# Patient Record
Sex: Female | Born: 1940
Health system: Southern US, Community
[De-identification: ages and names within clinical notes are randomized; demographics above are authoritative.]

## PROBLEM LIST (undated history)

## (undated) DIAGNOSIS — M810 Age-related osteoporosis without current pathological fracture: Secondary | ICD-10-CM

## (undated) DIAGNOSIS — T7840XA Allergy, unspecified, initial encounter: Secondary | ICD-10-CM

## (undated) DIAGNOSIS — Z5189 Encounter for other specified aftercare: Secondary | ICD-10-CM

## (undated) DIAGNOSIS — H269 Unspecified cataract: Secondary | ICD-10-CM

## (undated) DIAGNOSIS — J329 Chronic sinusitis, unspecified: Secondary | ICD-10-CM

## (undated) DIAGNOSIS — I2510 Atherosclerotic heart disease of native coronary artery without angina pectoris: Secondary | ICD-10-CM

## (undated) DIAGNOSIS — C50919 Malignant neoplasm of unspecified site of unspecified female breast: Secondary | ICD-10-CM

## (undated) DIAGNOSIS — E785 Hyperlipidemia, unspecified: Secondary | ICD-10-CM

## (undated) DIAGNOSIS — R011 Cardiac murmur, unspecified: Secondary | ICD-10-CM

## (undated) DIAGNOSIS — K219 Gastro-esophageal reflux disease without esophagitis: Secondary | ICD-10-CM

## (undated) DIAGNOSIS — I82409 Acute embolism and thrombosis of unspecified deep veins of unspecified lower extremity: Secondary | ICD-10-CM

## (undated) DIAGNOSIS — I6529 Occlusion and stenosis of unspecified carotid artery: Secondary | ICD-10-CM

## (undated) DIAGNOSIS — K2960 Other gastritis without bleeding: Secondary | ICD-10-CM

## (undated) DIAGNOSIS — I10 Essential (primary) hypertension: Secondary | ICD-10-CM

## (undated) DIAGNOSIS — F419 Anxiety disorder, unspecified: Secondary | ICD-10-CM

## (undated) DIAGNOSIS — I350 Nonrheumatic aortic (valve) stenosis: Secondary | ICD-10-CM

## (undated) DIAGNOSIS — F329 Major depressive disorder, single episode, unspecified: Secondary | ICD-10-CM

## (undated) DIAGNOSIS — J309 Allergic rhinitis, unspecified: Secondary | ICD-10-CM

## (undated) DIAGNOSIS — Z86718 Personal history of other venous thrombosis and embolism: Secondary | ICD-10-CM

## (undated) DIAGNOSIS — F32A Depression, unspecified: Secondary | ICD-10-CM

## (undated) DIAGNOSIS — E039 Hypothyroidism, unspecified: Secondary | ICD-10-CM

## (undated) DIAGNOSIS — M8580 Other specified disorders of bone density and structure, unspecified site: Secondary | ICD-10-CM

## (undated) DIAGNOSIS — K5730 Diverticulosis of large intestine without perforation or abscess without bleeding: Secondary | ICD-10-CM

## (undated) HISTORY — DX: Encounter for other specified aftercare: Z51.89

## (undated) HISTORY — DX: Hypothyroidism, unspecified: E03.9

## (undated) HISTORY — DX: Hyperlipidemia, unspecified: E78.5

## (undated) HISTORY — DX: Depression, unspecified: F32.A

## (undated) HISTORY — DX: Age-related osteoporosis without current pathological fracture: M81.0

## (undated) HISTORY — PX: TONSILLECTOMY AND ADENOIDECTOMY: SUR1326

## (undated) HISTORY — DX: Acute embolism and thrombosis of unspecified deep veins of unspecified lower extremity: I82.409

## (undated) HISTORY — DX: Other specified disorders of bone density and structure, unspecified site: M85.80

## (undated) HISTORY — DX: Malignant neoplasm of unspecified site of unspecified female breast: C50.919

## (undated) HISTORY — PX: CORONARY ARTERY BYPASS GRAFT: SHX141

## (undated) HISTORY — DX: Cardiac murmur, unspecified: R01.1

## (undated) HISTORY — PX: COLONOSCOPY: SHX174

## (undated) HISTORY — DX: Atherosclerotic heart disease of native coronary artery without angina pectoris: I25.10

## (undated) HISTORY — PX: VAGINAL HYSTERECTOMY: SUR661

## (undated) HISTORY — DX: Other gastritis without bleeding: K29.60

## (undated) HISTORY — DX: Gastro-esophageal reflux disease without esophagitis: K21.9

## (undated) HISTORY — DX: Anxiety disorder, unspecified: F41.9

## (undated) HISTORY — PX: APPENDECTOMY: SHX54

## (undated) HISTORY — DX: Chronic sinusitis, unspecified: J32.9

## (undated) HISTORY — DX: Major depressive disorder, single episode, unspecified: F32.9

## (undated) HISTORY — DX: Unspecified cataract: H26.9

## (undated) HISTORY — DX: Personal history of other venous thrombosis and embolism: Z86.718

## (undated) HISTORY — DX: Occlusion and stenosis of unspecified carotid artery: I65.29

## (undated) HISTORY — PX: OTHER SURGICAL HISTORY: SHX169

## (undated) HISTORY — PX: UPPER GASTROINTESTINAL ENDOSCOPY: SHX188

## (undated) HISTORY — DX: Allergy, unspecified, initial encounter: T78.40XA

## (undated) HISTORY — DX: Nonrheumatic aortic (valve) stenosis: I35.0

## (undated) HISTORY — DX: Allergic rhinitis, unspecified: J30.9

## (undated) HISTORY — DX: Diverticulosis of large intestine without perforation or abscess without bleeding: K57.30

## (undated) HISTORY — PX: MASTECTOMY: SHX3

## (undated) HISTORY — DX: Essential (primary) hypertension: I10

---

## 1989-11-15 DIAGNOSIS — C50919 Malignant neoplasm of unspecified site of unspecified female breast: Secondary | ICD-10-CM

## 1989-11-15 HISTORY — DX: Malignant neoplasm of unspecified site of unspecified female breast: C50.919

## 1999-02-16 ENCOUNTER — Other Ambulatory Visit: Admission: RE | Admit: 1999-02-16 | Discharge: 1999-02-16 | Payer: Self-pay | Admitting: Obstetrics and Gynecology

## 2000-01-20 ENCOUNTER — Emergency Department (HOSPITAL_COMMUNITY): Admission: EM | Admit: 2000-01-20 | Discharge: 2000-01-20 | Payer: Self-pay | Admitting: Emergency Medicine

## 2000-01-20 ENCOUNTER — Encounter: Payer: Self-pay | Admitting: Emergency Medicine

## 2000-02-11 ENCOUNTER — Encounter: Payer: Self-pay | Admitting: Emergency Medicine

## 2000-02-11 ENCOUNTER — Emergency Department (HOSPITAL_COMMUNITY): Admission: EM | Admit: 2000-02-11 | Discharge: 2000-02-11 | Payer: Self-pay | Admitting: Emergency Medicine

## 2000-02-18 ENCOUNTER — Other Ambulatory Visit: Admission: RE | Admit: 2000-02-18 | Discharge: 2000-02-18 | Payer: Self-pay | Admitting: Obstetrics and Gynecology

## 2000-09-14 ENCOUNTER — Encounter: Admission: RE | Admit: 2000-09-14 | Discharge: 2000-09-14 | Payer: Self-pay | Admitting: Otolaryngology

## 2000-09-14 ENCOUNTER — Encounter: Payer: Self-pay | Admitting: Otolaryngology

## 2000-12-13 ENCOUNTER — Ambulatory Visit (HOSPITAL_COMMUNITY): Admission: RE | Admit: 2000-12-13 | Discharge: 2000-12-13 | Payer: Self-pay | Admitting: Internal Medicine

## 2000-12-13 ENCOUNTER — Encounter: Payer: Self-pay | Admitting: Internal Medicine

## 2001-02-08 ENCOUNTER — Encounter (INDEPENDENT_AMBULATORY_CARE_PROVIDER_SITE_OTHER): Payer: Self-pay | Admitting: *Deleted

## 2001-02-08 ENCOUNTER — Other Ambulatory Visit: Admission: RE | Admit: 2001-02-08 | Discharge: 2001-02-08 | Payer: Self-pay | Admitting: Internal Medicine

## 2001-02-08 ENCOUNTER — Encounter: Payer: Self-pay | Admitting: Internal Medicine

## 2001-02-08 ENCOUNTER — Encounter (INDEPENDENT_AMBULATORY_CARE_PROVIDER_SITE_OTHER): Payer: Self-pay | Admitting: Specialist

## 2001-02-20 ENCOUNTER — Other Ambulatory Visit: Admission: RE | Admit: 2001-02-20 | Discharge: 2001-02-20 | Payer: Self-pay | Admitting: Obstetrics and Gynecology

## 2001-08-08 ENCOUNTER — Encounter: Admission: RE | Admit: 2001-08-08 | Discharge: 2001-08-08 | Payer: Self-pay | Admitting: Internal Medicine

## 2001-08-08 ENCOUNTER — Encounter: Payer: Self-pay | Admitting: Internal Medicine

## 2002-01-28 ENCOUNTER — Encounter: Payer: Self-pay | Admitting: Emergency Medicine

## 2002-01-28 ENCOUNTER — Emergency Department (HOSPITAL_COMMUNITY): Admission: EM | Admit: 2002-01-28 | Discharge: 2002-01-28 | Payer: Self-pay | Admitting: Emergency Medicine

## 2002-02-23 ENCOUNTER — Other Ambulatory Visit: Admission: RE | Admit: 2002-02-23 | Discharge: 2002-02-23 | Payer: Self-pay | Admitting: Obstetrics and Gynecology

## 2002-07-27 ENCOUNTER — Ambulatory Visit (HOSPITAL_COMMUNITY): Admission: RE | Admit: 2002-07-27 | Discharge: 2002-07-27 | Payer: Self-pay | Admitting: Internal Medicine

## 2002-07-27 ENCOUNTER — Encounter: Payer: Self-pay | Admitting: Internal Medicine

## 2002-08-06 ENCOUNTER — Encounter (INDEPENDENT_AMBULATORY_CARE_PROVIDER_SITE_OTHER): Payer: Self-pay | Admitting: Specialist

## 2002-08-06 ENCOUNTER — Ambulatory Visit (HOSPITAL_COMMUNITY): Admission: RE | Admit: 2002-08-06 | Discharge: 2002-08-06 | Payer: Self-pay | Admitting: Internal Medicine

## 2002-09-05 ENCOUNTER — Inpatient Hospital Stay (HOSPITAL_COMMUNITY): Admission: AD | Admit: 2002-09-05 | Discharge: 2002-09-06 | Payer: Self-pay | Admitting: Internal Medicine

## 2002-09-05 ENCOUNTER — Encounter: Payer: Self-pay | Admitting: Internal Medicine

## 2002-09-06 ENCOUNTER — Encounter: Payer: Self-pay | Admitting: Internal Medicine

## 2003-04-16 ENCOUNTER — Encounter: Payer: Self-pay | Admitting: Neurosurgery

## 2003-04-16 ENCOUNTER — Ambulatory Visit (HOSPITAL_COMMUNITY): Admission: RE | Admit: 2003-04-16 | Discharge: 2003-04-16 | Payer: Self-pay | Admitting: Neurosurgery

## 2003-09-03 ENCOUNTER — Encounter: Payer: Self-pay | Admitting: Internal Medicine

## 2003-09-03 ENCOUNTER — Encounter: Admission: RE | Admit: 2003-09-03 | Discharge: 2003-09-03 | Payer: Self-pay | Admitting: Internal Medicine

## 2003-10-24 ENCOUNTER — Encounter: Admission: RE | Admit: 2003-10-24 | Discharge: 2003-10-24 | Payer: Self-pay | Admitting: Internal Medicine

## 2003-12-31 ENCOUNTER — Encounter: Admission: RE | Admit: 2003-12-31 | Discharge: 2003-12-31 | Payer: Self-pay | Admitting: Family Medicine

## 2004-02-08 ENCOUNTER — Encounter: Admission: RE | Admit: 2004-02-08 | Discharge: 2004-02-08 | Payer: Self-pay | Admitting: Orthopedic Surgery

## 2004-11-25 ENCOUNTER — Ambulatory Visit: Payer: Self-pay | Admitting: Internal Medicine

## 2004-12-02 ENCOUNTER — Ambulatory Visit: Payer: Self-pay | Admitting: Internal Medicine

## 2004-12-09 ENCOUNTER — Ambulatory Visit: Payer: Self-pay | Admitting: Internal Medicine

## 2004-12-16 ENCOUNTER — Ambulatory Visit: Payer: Self-pay | Admitting: Internal Medicine

## 2004-12-23 ENCOUNTER — Ambulatory Visit: Payer: Self-pay | Admitting: Internal Medicine

## 2004-12-30 ENCOUNTER — Ambulatory Visit: Payer: Self-pay | Admitting: Internal Medicine

## 2004-12-30 ENCOUNTER — Other Ambulatory Visit: Admission: RE | Admit: 2004-12-30 | Discharge: 2004-12-30 | Payer: Self-pay | Admitting: Family Medicine

## 2005-01-06 ENCOUNTER — Ambulatory Visit: Payer: Self-pay | Admitting: Internal Medicine

## 2005-01-14 ENCOUNTER — Ambulatory Visit: Payer: Self-pay | Admitting: Internal Medicine

## 2005-01-20 ENCOUNTER — Ambulatory Visit: Payer: Self-pay | Admitting: Internal Medicine

## 2005-01-26 ENCOUNTER — Ambulatory Visit: Payer: Self-pay | Admitting: Internal Medicine

## 2005-02-03 ENCOUNTER — Ambulatory Visit: Payer: Self-pay | Admitting: Internal Medicine

## 2005-02-10 ENCOUNTER — Ambulatory Visit: Payer: Self-pay | Admitting: Internal Medicine

## 2005-02-17 ENCOUNTER — Ambulatory Visit: Payer: Self-pay | Admitting: Internal Medicine

## 2005-02-24 ENCOUNTER — Ambulatory Visit: Payer: Self-pay | Admitting: Internal Medicine

## 2005-03-03 ENCOUNTER — Ambulatory Visit: Payer: Self-pay | Admitting: Internal Medicine

## 2005-03-10 ENCOUNTER — Ambulatory Visit: Payer: Self-pay | Admitting: Internal Medicine

## 2005-03-15 ENCOUNTER — Ambulatory Visit: Payer: Self-pay | Admitting: Internal Medicine

## 2005-03-24 ENCOUNTER — Ambulatory Visit: Payer: Self-pay | Admitting: Internal Medicine

## 2005-03-31 ENCOUNTER — Ambulatory Visit: Payer: Self-pay | Admitting: Internal Medicine

## 2005-04-07 ENCOUNTER — Ambulatory Visit: Payer: Self-pay | Admitting: Internal Medicine

## 2005-04-14 ENCOUNTER — Ambulatory Visit: Payer: Self-pay | Admitting: Internal Medicine

## 2005-04-23 ENCOUNTER — Ambulatory Visit: Payer: Self-pay | Admitting: Internal Medicine

## 2005-04-26 ENCOUNTER — Ambulatory Visit: Payer: Self-pay | Admitting: Internal Medicine

## 2005-05-03 ENCOUNTER — Ambulatory Visit: Payer: Self-pay | Admitting: Internal Medicine

## 2005-05-14 ENCOUNTER — Ambulatory Visit: Payer: Self-pay | Admitting: Internal Medicine

## 2005-05-17 ENCOUNTER — Ambulatory Visit: Payer: Self-pay | Admitting: Internal Medicine

## 2005-05-19 ENCOUNTER — Ambulatory Visit: Payer: Self-pay | Admitting: Internal Medicine

## 2005-05-26 ENCOUNTER — Ambulatory Visit: Payer: Self-pay | Admitting: Internal Medicine

## 2005-06-01 ENCOUNTER — Ambulatory Visit: Payer: Self-pay | Admitting: Internal Medicine

## 2005-06-09 ENCOUNTER — Ambulatory Visit: Payer: Self-pay | Admitting: Internal Medicine

## 2005-06-11 ENCOUNTER — Emergency Department (HOSPITAL_COMMUNITY): Admission: EM | Admit: 2005-06-11 | Discharge: 2005-06-11 | Payer: Self-pay | Admitting: Emergency Medicine

## 2005-06-15 ENCOUNTER — Ambulatory Visit: Payer: Self-pay | Admitting: Internal Medicine

## 2005-06-18 ENCOUNTER — Encounter: Admission: RE | Admit: 2005-06-18 | Discharge: 2005-06-18 | Payer: Self-pay | Admitting: Family Medicine

## 2005-06-25 ENCOUNTER — Ambulatory Visit: Payer: Self-pay | Admitting: Internal Medicine

## 2005-07-02 ENCOUNTER — Ambulatory Visit: Payer: Self-pay | Admitting: Internal Medicine

## 2005-07-08 ENCOUNTER — Ambulatory Visit: Payer: Self-pay | Admitting: Internal Medicine

## 2005-07-13 ENCOUNTER — Ambulatory Visit: Payer: Self-pay | Admitting: Internal Medicine

## 2005-07-23 ENCOUNTER — Ambulatory Visit: Payer: Self-pay | Admitting: Internal Medicine

## 2005-07-28 ENCOUNTER — Ambulatory Visit: Payer: Self-pay | Admitting: Internal Medicine

## 2005-08-03 ENCOUNTER — Ambulatory Visit: Payer: Self-pay | Admitting: Internal Medicine

## 2005-08-12 ENCOUNTER — Ambulatory Visit: Payer: Self-pay | Admitting: Internal Medicine

## 2005-08-19 ENCOUNTER — Ambulatory Visit: Payer: Self-pay | Admitting: Internal Medicine

## 2005-08-25 ENCOUNTER — Ambulatory Visit: Payer: Self-pay | Admitting: Internal Medicine

## 2005-09-01 ENCOUNTER — Ambulatory Visit: Payer: Self-pay | Admitting: Internal Medicine

## 2005-09-07 ENCOUNTER — Ambulatory Visit: Payer: Self-pay | Admitting: Internal Medicine

## 2005-09-15 ENCOUNTER — Ambulatory Visit: Payer: Self-pay | Admitting: Internal Medicine

## 2005-09-22 ENCOUNTER — Ambulatory Visit: Payer: Self-pay | Admitting: Pulmonary Disease

## 2005-09-22 ENCOUNTER — Ambulatory Visit: Payer: Self-pay | Admitting: Internal Medicine

## 2005-09-28 ENCOUNTER — Ambulatory Visit: Payer: Self-pay | Admitting: Internal Medicine

## 2005-10-05 ENCOUNTER — Ambulatory Visit: Payer: Self-pay | Admitting: Internal Medicine

## 2005-10-12 ENCOUNTER — Ambulatory Visit: Payer: Self-pay | Admitting: Internal Medicine

## 2005-10-19 ENCOUNTER — Encounter: Admission: RE | Admit: 2005-10-19 | Discharge: 2006-01-17 | Payer: Self-pay | Admitting: Neurosurgery

## 2005-10-21 ENCOUNTER — Ambulatory Visit: Payer: Self-pay | Admitting: Internal Medicine

## 2005-10-26 ENCOUNTER — Ambulatory Visit: Payer: Self-pay | Admitting: Internal Medicine

## 2005-11-02 ENCOUNTER — Ambulatory Visit: Payer: Self-pay | Admitting: Internal Medicine

## 2005-11-09 ENCOUNTER — Ambulatory Visit: Payer: Self-pay | Admitting: Internal Medicine

## 2005-11-17 ENCOUNTER — Ambulatory Visit: Payer: Self-pay | Admitting: Internal Medicine

## 2005-11-26 ENCOUNTER — Ambulatory Visit: Payer: Self-pay | Admitting: Internal Medicine

## 2005-11-29 ENCOUNTER — Ambulatory Visit: Payer: Self-pay | Admitting: Internal Medicine

## 2005-12-06 ENCOUNTER — Ambulatory Visit: Payer: Self-pay | Admitting: Internal Medicine

## 2005-12-09 ENCOUNTER — Ambulatory Visit: Payer: Self-pay | Admitting: Internal Medicine

## 2005-12-24 ENCOUNTER — Ambulatory Visit: Payer: Self-pay | Admitting: Internal Medicine

## 2005-12-28 ENCOUNTER — Ambulatory Visit: Payer: Self-pay | Admitting: Internal Medicine

## 2006-01-04 ENCOUNTER — Ambulatory Visit: Payer: Self-pay | Admitting: Internal Medicine

## 2006-01-11 ENCOUNTER — Ambulatory Visit: Payer: Self-pay | Admitting: Internal Medicine

## 2006-01-18 ENCOUNTER — Ambulatory Visit: Payer: Self-pay | Admitting: Internal Medicine

## 2006-01-25 ENCOUNTER — Ambulatory Visit: Payer: Self-pay | Admitting: Internal Medicine

## 2006-01-31 ENCOUNTER — Ambulatory Visit: Payer: Self-pay | Admitting: Internal Medicine

## 2006-01-31 LAB — HM COLONOSCOPY

## 2006-02-01 ENCOUNTER — Ambulatory Visit: Payer: Self-pay | Admitting: Internal Medicine

## 2006-02-09 ENCOUNTER — Ambulatory Visit: Payer: Self-pay | Admitting: Internal Medicine

## 2006-02-11 ENCOUNTER — Ambulatory Visit: Payer: Self-pay | Admitting: Internal Medicine

## 2006-02-15 ENCOUNTER — Ambulatory Visit: Payer: Self-pay | Admitting: Internal Medicine

## 2006-02-21 ENCOUNTER — Ambulatory Visit: Payer: Self-pay | Admitting: Internal Medicine

## 2006-03-01 ENCOUNTER — Ambulatory Visit: Payer: Self-pay | Admitting: Internal Medicine

## 2006-03-08 ENCOUNTER — Ambulatory Visit: Payer: Self-pay | Admitting: Internal Medicine

## 2006-03-17 ENCOUNTER — Ambulatory Visit: Payer: Self-pay | Admitting: Internal Medicine

## 2006-03-22 ENCOUNTER — Ambulatory Visit: Payer: Self-pay | Admitting: Internal Medicine

## 2006-03-28 ENCOUNTER — Ambulatory Visit: Payer: Self-pay | Admitting: Internal Medicine

## 2006-04-05 ENCOUNTER — Ambulatory Visit: Payer: Self-pay | Admitting: Internal Medicine

## 2006-04-12 ENCOUNTER — Ambulatory Visit: Payer: Self-pay | Admitting: Internal Medicine

## 2006-04-19 ENCOUNTER — Ambulatory Visit: Payer: Self-pay | Admitting: Internal Medicine

## 2006-04-26 ENCOUNTER — Ambulatory Visit: Payer: Self-pay | Admitting: Internal Medicine

## 2006-05-02 ENCOUNTER — Ambulatory Visit: Payer: Self-pay | Admitting: Internal Medicine

## 2006-05-11 ENCOUNTER — Ambulatory Visit: Payer: Self-pay | Admitting: Internal Medicine

## 2006-05-17 ENCOUNTER — Ambulatory Visit: Payer: Self-pay | Admitting: Internal Medicine

## 2006-05-23 ENCOUNTER — Ambulatory Visit: Payer: Self-pay | Admitting: Internal Medicine

## 2006-05-30 ENCOUNTER — Ambulatory Visit: Payer: Self-pay | Admitting: Internal Medicine

## 2006-05-31 ENCOUNTER — Ambulatory Visit (HOSPITAL_BASED_OUTPATIENT_CLINIC_OR_DEPARTMENT_OTHER): Admission: RE | Admit: 2006-05-31 | Discharge: 2006-06-01 | Payer: Self-pay | Admitting: Orthopedic Surgery

## 2006-06-01 ENCOUNTER — Ambulatory Visit: Payer: Self-pay | Admitting: Internal Medicine

## 2006-06-15 ENCOUNTER — Ambulatory Visit: Payer: Self-pay | Admitting: Internal Medicine

## 2006-06-23 ENCOUNTER — Ambulatory Visit: Payer: Self-pay | Admitting: Internal Medicine

## 2006-06-28 ENCOUNTER — Ambulatory Visit: Payer: Self-pay | Admitting: Internal Medicine

## 2006-07-04 ENCOUNTER — Ambulatory Visit: Payer: Self-pay | Admitting: Internal Medicine

## 2006-07-12 ENCOUNTER — Ambulatory Visit: Payer: Self-pay | Admitting: Internal Medicine

## 2006-07-19 ENCOUNTER — Ambulatory Visit: Payer: Self-pay | Admitting: Internal Medicine

## 2006-07-26 ENCOUNTER — Ambulatory Visit: Payer: Self-pay | Admitting: Internal Medicine

## 2006-08-02 ENCOUNTER — Ambulatory Visit: Payer: Self-pay | Admitting: Internal Medicine

## 2006-08-10 ENCOUNTER — Ambulatory Visit: Payer: Self-pay | Admitting: Internal Medicine

## 2006-08-18 ENCOUNTER — Ambulatory Visit: Payer: Self-pay | Admitting: Internal Medicine

## 2006-08-22 ENCOUNTER — Ambulatory Visit: Payer: Self-pay | Admitting: Internal Medicine

## 2006-08-31 ENCOUNTER — Ambulatory Visit: Payer: Self-pay | Admitting: Internal Medicine

## 2006-09-08 ENCOUNTER — Ambulatory Visit: Payer: Self-pay | Admitting: Internal Medicine

## 2006-09-12 ENCOUNTER — Ambulatory Visit: Payer: Self-pay | Admitting: Internal Medicine

## 2006-09-22 ENCOUNTER — Inpatient Hospital Stay (HOSPITAL_COMMUNITY): Admission: EM | Admit: 2006-09-22 | Discharge: 2006-10-04 | Payer: Self-pay | Admitting: Emergency Medicine

## 2006-09-22 ENCOUNTER — Ambulatory Visit: Payer: Self-pay | Admitting: Cardiology

## 2006-09-23 ENCOUNTER — Encounter: Payer: Self-pay | Admitting: Vascular Surgery

## 2006-09-24 ENCOUNTER — Encounter: Payer: Self-pay | Admitting: Cardiology

## 2006-09-24 ENCOUNTER — Encounter: Payer: Self-pay | Admitting: Vascular Surgery

## 2006-10-13 ENCOUNTER — Encounter
Admission: RE | Admit: 2006-10-13 | Discharge: 2006-10-13 | Payer: Self-pay | Admitting: Thoracic Surgery (Cardiothoracic Vascular Surgery)

## 2006-10-15 ENCOUNTER — Inpatient Hospital Stay (HOSPITAL_COMMUNITY): Admission: EM | Admit: 2006-10-15 | Discharge: 2006-10-19 | Payer: Self-pay | Admitting: Emergency Medicine

## 2006-10-15 ENCOUNTER — Ambulatory Visit: Payer: Self-pay | Admitting: Cardiology

## 2006-10-16 ENCOUNTER — Encounter (INDEPENDENT_AMBULATORY_CARE_PROVIDER_SITE_OTHER): Payer: Self-pay | Admitting: *Deleted

## 2006-10-16 ENCOUNTER — Encounter (INDEPENDENT_AMBULATORY_CARE_PROVIDER_SITE_OTHER): Payer: Self-pay | Admitting: Specialist

## 2006-10-17 ENCOUNTER — Encounter: Payer: Self-pay | Admitting: Cardiology

## 2006-10-17 ENCOUNTER — Encounter: Payer: Self-pay | Admitting: Internal Medicine

## 2006-10-17 DIAGNOSIS — K2960 Other gastritis without bleeding: Secondary | ICD-10-CM | POA: Insufficient documentation

## 2006-10-19 ENCOUNTER — Ambulatory Visit: Payer: Self-pay | Admitting: Internal Medicine

## 2006-10-20 ENCOUNTER — Ambulatory Visit: Payer: Self-pay

## 2006-10-20 ENCOUNTER — Ambulatory Visit: Payer: Self-pay | Admitting: Cardiovascular Disease

## 2006-10-21 ENCOUNTER — Ambulatory Visit: Payer: Self-pay | Admitting: Internal Medicine

## 2006-10-24 ENCOUNTER — Ambulatory Visit: Payer: Self-pay | Admitting: Internal Medicine

## 2006-11-01 ENCOUNTER — Ambulatory Visit: Payer: Self-pay | Admitting: Internal Medicine

## 2006-11-11 ENCOUNTER — Ambulatory Visit: Payer: Self-pay | Admitting: Internal Medicine

## 2006-11-14 ENCOUNTER — Ambulatory Visit: Payer: Self-pay | Admitting: Cardiovascular Disease

## 2006-11-16 ENCOUNTER — Ambulatory Visit: Payer: Self-pay | Admitting: Internal Medicine

## 2006-11-23 ENCOUNTER — Ambulatory Visit: Payer: Self-pay | Admitting: Internal Medicine

## 2006-11-28 ENCOUNTER — Encounter: Admission: RE | Admit: 2006-11-28 | Discharge: 2007-02-26 | Payer: Self-pay | Admitting: Cardiovascular Disease

## 2006-11-29 ENCOUNTER — Ambulatory Visit: Payer: Self-pay | Admitting: Internal Medicine

## 2006-11-30 ENCOUNTER — Ambulatory Visit: Payer: Self-pay | Admitting: Cardiovascular Disease

## 2006-11-30 LAB — CONVERTED CEMR LAB
AST: 16 units/L (ref 0–37)
Albumin: 3.8 g/dL (ref 3.5–5.2)
Alkaline Phosphatase: 44 units/L (ref 39–117)
Total CHOL/HDL Ratio: 3
Triglycerides: 67 mg/dL (ref 0–149)
VLDL: 13 mg/dL (ref 0–40)

## 2006-12-07 ENCOUNTER — Ambulatory Visit: Payer: Self-pay | Admitting: Internal Medicine

## 2006-12-13 ENCOUNTER — Ambulatory Visit: Payer: Self-pay | Admitting: Internal Medicine

## 2006-12-20 ENCOUNTER — Ambulatory Visit: Payer: Self-pay | Admitting: Internal Medicine

## 2006-12-29 ENCOUNTER — Ambulatory Visit: Payer: Self-pay | Admitting: Internal Medicine

## 2007-01-03 ENCOUNTER — Ambulatory Visit: Payer: Self-pay | Admitting: Internal Medicine

## 2007-01-04 ENCOUNTER — Other Ambulatory Visit: Admission: RE | Admit: 2007-01-04 | Discharge: 2007-01-04 | Payer: Self-pay | Admitting: Family Medicine

## 2007-01-10 ENCOUNTER — Ambulatory Visit: Payer: Self-pay | Admitting: Internal Medicine

## 2007-01-12 ENCOUNTER — Ambulatory Visit: Payer: Self-pay | Admitting: Cardiovascular Disease

## 2007-01-19 ENCOUNTER — Ambulatory Visit: Payer: Self-pay | Admitting: Internal Medicine

## 2007-01-27 ENCOUNTER — Ambulatory Visit: Payer: Self-pay | Admitting: Internal Medicine

## 2007-01-31 ENCOUNTER — Ambulatory Visit: Payer: Self-pay | Admitting: Internal Medicine

## 2007-02-08 ENCOUNTER — Encounter: Admission: RE | Admit: 2007-02-08 | Discharge: 2007-02-08 | Payer: Self-pay | Admitting: Family Medicine

## 2007-02-08 ENCOUNTER — Ambulatory Visit: Payer: Self-pay | Admitting: Internal Medicine

## 2007-02-14 ENCOUNTER — Ambulatory Visit: Payer: Self-pay | Admitting: Internal Medicine

## 2007-02-21 ENCOUNTER — Ambulatory Visit: Payer: Self-pay | Admitting: Internal Medicine

## 2007-03-01 ENCOUNTER — Ambulatory Visit: Payer: Self-pay | Admitting: Internal Medicine

## 2007-03-02 ENCOUNTER — Ambulatory Visit: Payer: Self-pay | Admitting: Internal Medicine

## 2007-03-06 ENCOUNTER — Ambulatory Visit: Payer: Self-pay | Admitting: Internal Medicine

## 2007-03-16 ENCOUNTER — Ambulatory Visit: Payer: Self-pay | Admitting: Internal Medicine

## 2007-03-21 ENCOUNTER — Ambulatory Visit: Payer: Self-pay | Admitting: Internal Medicine

## 2007-03-29 ENCOUNTER — Ambulatory Visit: Payer: Self-pay | Admitting: Internal Medicine

## 2007-04-05 ENCOUNTER — Ambulatory Visit: Payer: Self-pay | Admitting: Internal Medicine

## 2007-04-12 ENCOUNTER — Ambulatory Visit: Payer: Self-pay | Admitting: Internal Medicine

## 2007-04-19 ENCOUNTER — Ambulatory Visit: Payer: Self-pay | Admitting: Internal Medicine

## 2007-04-26 ENCOUNTER — Ambulatory Visit: Payer: Self-pay | Admitting: Internal Medicine

## 2007-04-27 ENCOUNTER — Ambulatory Visit: Payer: Self-pay | Admitting: Cardiovascular Disease

## 2007-04-27 LAB — CONVERTED CEMR LAB
Bilirubin, Direct: 0.1 mg/dL (ref 0.0–0.3)
Cholesterol: 173 mg/dL (ref 0–200)
HDL: 41.3 mg/dL (ref >39.0)
LDL Cholesterol: 117 mg/dL — ABNORMAL HIGH (ref 0–99)
Total CHOL/HDL Ratio: 4.2
Total Protein: 6.4 g/dL (ref 6.0–8.3)

## 2007-05-03 ENCOUNTER — Ambulatory Visit: Payer: Self-pay | Admitting: Internal Medicine

## 2007-05-09 ENCOUNTER — Ambulatory Visit: Payer: Self-pay | Admitting: Internal Medicine

## 2007-05-17 ENCOUNTER — Ambulatory Visit: Payer: Self-pay | Admitting: Internal Medicine

## 2007-05-24 ENCOUNTER — Ambulatory Visit: Payer: Self-pay | Admitting: Internal Medicine

## 2007-05-31 ENCOUNTER — Ambulatory Visit: Payer: Self-pay | Admitting: Internal Medicine

## 2007-06-07 ENCOUNTER — Ambulatory Visit: Payer: Self-pay | Admitting: Internal Medicine

## 2007-06-13 ENCOUNTER — Ambulatory Visit: Payer: Self-pay | Admitting: Internal Medicine

## 2007-06-19 ENCOUNTER — Ambulatory Visit: Payer: Self-pay | Admitting: Internal Medicine

## 2007-06-22 ENCOUNTER — Ambulatory Visit: Payer: Self-pay | Admitting: Internal Medicine

## 2007-06-29 ENCOUNTER — Ambulatory Visit: Payer: Self-pay | Admitting: Internal Medicine

## 2007-07-03 ENCOUNTER — Ambulatory Visit: Payer: Self-pay | Admitting: Internal Medicine

## 2007-07-12 ENCOUNTER — Ambulatory Visit: Payer: Self-pay | Admitting: Internal Medicine

## 2007-07-19 ENCOUNTER — Ambulatory Visit: Payer: Self-pay | Admitting: Cardiovascular Disease

## 2007-07-19 LAB — CONVERTED CEMR LAB
AST: 20 units/L (ref 0–37)
Bilirubin, Direct: 0.1 mg/dL (ref 0.0–0.3)
HDL: 38.8 mg/dL — ABNORMAL LOW (ref >39.0)
Total Bilirubin: 1.2 mg/dL (ref 0.3–1.2)
Total Protein: 6.4 g/dL (ref 6.0–8.3)
Triglycerides: 74 mg/dL (ref 0–149)

## 2007-07-20 ENCOUNTER — Ambulatory Visit: Payer: Self-pay | Admitting: Internal Medicine

## 2007-07-24 ENCOUNTER — Ambulatory Visit: Payer: Self-pay | Admitting: Internal Medicine

## 2007-07-26 ENCOUNTER — Ambulatory Visit: Payer: Self-pay | Admitting: Cardiovascular Disease

## 2007-07-31 ENCOUNTER — Ambulatory Visit: Payer: Self-pay | Admitting: Internal Medicine

## 2007-08-08 ENCOUNTER — Ambulatory Visit: Payer: Self-pay | Admitting: Internal Medicine

## 2007-08-14 ENCOUNTER — Ambulatory Visit: Payer: Self-pay | Admitting: Internal Medicine

## 2007-08-23 DIAGNOSIS — I82409 Acute embolism and thrombosis of unspecified deep veins of unspecified lower extremity: Secondary | ICD-10-CM | POA: Insufficient documentation

## 2007-08-23 DIAGNOSIS — I350 Nonrheumatic aortic (valve) stenosis: Secondary | ICD-10-CM | POA: Insufficient documentation

## 2007-08-23 DIAGNOSIS — J302 Other seasonal allergic rhinitis: Secondary | ICD-10-CM | POA: Insufficient documentation

## 2007-08-23 DIAGNOSIS — J3089 Other allergic rhinitis: Secondary | ICD-10-CM

## 2007-08-23 DIAGNOSIS — J45998 Other asthma: Secondary | ICD-10-CM

## 2007-08-23 DIAGNOSIS — I2510 Atherosclerotic heart disease of native coronary artery without angina pectoris: Secondary | ICD-10-CM

## 2007-08-23 DIAGNOSIS — I25119 Atherosclerotic heart disease of native coronary artery with unspecified angina pectoris: Secondary | ICD-10-CM | POA: Insufficient documentation

## 2007-08-29 ENCOUNTER — Ambulatory Visit: Payer: Self-pay | Admitting: Internal Medicine

## 2007-09-05 ENCOUNTER — Ambulatory Visit: Payer: Self-pay | Admitting: Internal Medicine

## 2007-09-12 ENCOUNTER — Ambulatory Visit: Payer: Self-pay | Admitting: Internal Medicine

## 2007-09-18 ENCOUNTER — Ambulatory Visit: Payer: Self-pay | Admitting: Internal Medicine

## 2007-09-26 ENCOUNTER — Ambulatory Visit: Payer: Self-pay | Admitting: Internal Medicine

## 2007-10-03 ENCOUNTER — Ambulatory Visit: Payer: Self-pay

## 2007-10-03 ENCOUNTER — Encounter: Payer: Self-pay | Admitting: Cardiovascular Disease

## 2007-10-03 ENCOUNTER — Ambulatory Visit: Payer: Self-pay | Admitting: Internal Medicine

## 2007-10-09 ENCOUNTER — Ambulatory Visit: Payer: Self-pay | Admitting: Internal Medicine

## 2007-10-16 ENCOUNTER — Ambulatory Visit: Payer: Self-pay | Admitting: Internal Medicine

## 2007-10-23 ENCOUNTER — Ambulatory Visit: Payer: Self-pay | Admitting: Internal Medicine

## 2007-10-31 ENCOUNTER — Ambulatory Visit: Payer: Self-pay | Admitting: Internal Medicine

## 2007-11-01 ENCOUNTER — Ambulatory Visit: Payer: Self-pay | Admitting: Internal Medicine

## 2007-11-06 ENCOUNTER — Ambulatory Visit: Payer: Self-pay | Admitting: Internal Medicine

## 2007-11-13 ENCOUNTER — Ambulatory Visit: Payer: Self-pay | Admitting: Cardiovascular Disease

## 2007-11-13 ENCOUNTER — Ambulatory Visit: Payer: Self-pay | Admitting: Internal Medicine

## 2007-11-13 LAB — CONVERTED CEMR LAB
ALT: 21 units/L (ref 0–35)
Alkaline Phosphatase: 50 units/L (ref 39–117)
Cholesterol: 138 mg/dL (ref 0–200)
Total Bilirubin: 0.9 mg/dL (ref 0.3–1.2)
Total Protein: 6.7 g/dL (ref 6.0–8.3)

## 2007-11-20 ENCOUNTER — Ambulatory Visit: Payer: Self-pay | Admitting: Cardiovascular Disease

## 2007-11-22 ENCOUNTER — Ambulatory Visit: Payer: Self-pay | Admitting: Internal Medicine

## 2007-11-28 ENCOUNTER — Ambulatory Visit: Payer: Self-pay | Admitting: Internal Medicine

## 2007-12-04 ENCOUNTER — Ambulatory Visit: Payer: Self-pay | Admitting: Internal Medicine

## 2007-12-11 ENCOUNTER — Ambulatory Visit: Payer: Self-pay | Admitting: Internal Medicine

## 2007-12-20 ENCOUNTER — Ambulatory Visit: Payer: Self-pay | Admitting: Internal Medicine

## 2007-12-25 ENCOUNTER — Ambulatory Visit: Payer: Self-pay | Admitting: Internal Medicine

## 2008-01-02 ENCOUNTER — Ambulatory Visit: Payer: Self-pay | Admitting: Internal Medicine

## 2008-01-10 ENCOUNTER — Ambulatory Visit: Payer: Self-pay | Admitting: Internal Medicine

## 2008-01-16 ENCOUNTER — Ambulatory Visit: Payer: Self-pay | Admitting: Internal Medicine

## 2008-01-23 ENCOUNTER — Ambulatory Visit: Payer: Self-pay | Admitting: Internal Medicine

## 2008-01-29 ENCOUNTER — Encounter: Payer: Self-pay | Admitting: Internal Medicine

## 2008-01-30 ENCOUNTER — Ambulatory Visit: Payer: Self-pay | Admitting: Internal Medicine

## 2008-02-07 ENCOUNTER — Ambulatory Visit: Payer: Self-pay | Admitting: Internal Medicine

## 2008-02-14 ENCOUNTER — Ambulatory Visit: Payer: Self-pay | Admitting: Internal Medicine

## 2008-02-20 ENCOUNTER — Ambulatory Visit: Payer: Self-pay | Admitting: Internal Medicine

## 2008-02-26 ENCOUNTER — Ambulatory Visit: Payer: Self-pay | Admitting: Internal Medicine

## 2008-02-27 ENCOUNTER — Ambulatory Visit: Payer: Self-pay | Admitting: Internal Medicine

## 2008-03-04 ENCOUNTER — Ambulatory Visit: Payer: Self-pay | Admitting: Internal Medicine

## 2008-03-11 ENCOUNTER — Ambulatory Visit: Payer: Self-pay | Admitting: Internal Medicine

## 2008-03-16 ENCOUNTER — Ambulatory Visit: Payer: Self-pay | Admitting: Internal Medicine

## 2008-03-19 ENCOUNTER — Ambulatory Visit: Payer: Self-pay | Admitting: Internal Medicine

## 2008-03-25 ENCOUNTER — Encounter: Payer: Self-pay | Admitting: Internal Medicine

## 2008-03-27 ENCOUNTER — Ambulatory Visit: Payer: Self-pay | Admitting: Internal Medicine

## 2008-03-27 DIAGNOSIS — K5730 Diverticulosis of large intestine without perforation or abscess without bleeding: Secondary | ICD-10-CM | POA: Insufficient documentation

## 2008-03-27 DIAGNOSIS — K219 Gastro-esophageal reflux disease without esophagitis: Secondary | ICD-10-CM | POA: Insufficient documentation

## 2008-03-27 DIAGNOSIS — Z853 Personal history of malignant neoplasm of breast: Secondary | ICD-10-CM

## 2008-04-16 ENCOUNTER — Ambulatory Visit: Payer: Self-pay | Admitting: Internal Medicine

## 2008-04-22 ENCOUNTER — Ambulatory Visit: Payer: Self-pay | Admitting: Internal Medicine

## 2008-04-26 ENCOUNTER — Telehealth: Payer: Self-pay | Admitting: Internal Medicine

## 2008-04-29 ENCOUNTER — Telehealth: Payer: Self-pay | Admitting: Internal Medicine

## 2008-04-30 ENCOUNTER — Ambulatory Visit: Payer: Self-pay | Admitting: Internal Medicine

## 2008-04-30 ENCOUNTER — Encounter: Payer: Self-pay | Admitting: Internal Medicine

## 2008-05-07 ENCOUNTER — Ambulatory Visit: Payer: Self-pay | Admitting: Internal Medicine

## 2008-05-14 ENCOUNTER — Ambulatory Visit: Payer: Self-pay | Admitting: Internal Medicine

## 2008-05-22 ENCOUNTER — Ambulatory Visit: Payer: Self-pay | Admitting: Internal Medicine

## 2008-05-28 ENCOUNTER — Ambulatory Visit: Payer: Self-pay | Admitting: Internal Medicine

## 2008-05-29 ENCOUNTER — Ambulatory Visit: Payer: Self-pay | Admitting: Cardiovascular Disease

## 2008-05-29 LAB — CONVERTED CEMR LAB
AST: 24 units/L (ref 0–37)
Alkaline Phosphatase: 61 units/L (ref 39–117)
HDL: 43.6 mg/dL (ref >39.0)
Total Bilirubin: 1.1 mg/dL (ref 0.3–1.2)
Total CHOL/HDL Ratio: 3.1

## 2008-05-31 ENCOUNTER — Ambulatory Visit: Payer: Self-pay | Admitting: Cardiovascular Disease

## 2008-06-03 ENCOUNTER — Ambulatory Visit: Payer: Self-pay | Admitting: Internal Medicine

## 2008-06-12 ENCOUNTER — Ambulatory Visit: Payer: Self-pay

## 2008-06-12 ENCOUNTER — Ambulatory Visit: Payer: Self-pay | Admitting: Internal Medicine

## 2008-06-17 ENCOUNTER — Telehealth: Payer: Self-pay | Admitting: Internal Medicine

## 2008-06-17 ENCOUNTER — Ambulatory Visit: Payer: Self-pay | Admitting: Internal Medicine

## 2008-06-24 ENCOUNTER — Ambulatory Visit: Payer: Self-pay | Admitting: Internal Medicine

## 2008-07-02 ENCOUNTER — Ambulatory Visit: Payer: Self-pay | Admitting: Internal Medicine

## 2008-07-03 ENCOUNTER — Ambulatory Visit: Payer: Self-pay | Admitting: Internal Medicine

## 2008-07-09 ENCOUNTER — Ambulatory Visit: Payer: Self-pay | Admitting: Internal Medicine

## 2008-07-16 ENCOUNTER — Ambulatory Visit: Payer: Self-pay | Admitting: Internal Medicine

## 2008-07-23 ENCOUNTER — Ambulatory Visit: Payer: Self-pay | Admitting: Internal Medicine

## 2008-07-30 ENCOUNTER — Ambulatory Visit: Payer: Self-pay | Admitting: Internal Medicine

## 2008-08-05 ENCOUNTER — Ambulatory Visit: Payer: Self-pay | Admitting: Internal Medicine

## 2008-08-05 ENCOUNTER — Ambulatory Visit: Payer: Self-pay | Admitting: Pulmonary Disease

## 2008-08-12 ENCOUNTER — Ambulatory Visit: Payer: Self-pay | Admitting: Internal Medicine

## 2008-08-19 ENCOUNTER — Ambulatory Visit: Payer: Self-pay | Admitting: Internal Medicine

## 2008-08-26 ENCOUNTER — Ambulatory Visit: Payer: Self-pay | Admitting: Internal Medicine

## 2008-09-04 ENCOUNTER — Ambulatory Visit: Payer: Self-pay | Admitting: Internal Medicine

## 2008-09-09 ENCOUNTER — Ambulatory Visit: Payer: Self-pay | Admitting: Internal Medicine

## 2008-09-17 ENCOUNTER — Ambulatory Visit: Payer: Self-pay | Admitting: Internal Medicine

## 2008-09-24 ENCOUNTER — Ambulatory Visit: Payer: Self-pay | Admitting: Internal Medicine

## 2008-10-01 ENCOUNTER — Ambulatory Visit: Payer: Self-pay | Admitting: Internal Medicine

## 2008-10-07 ENCOUNTER — Ambulatory Visit: Payer: Self-pay | Admitting: Internal Medicine

## 2008-10-15 ENCOUNTER — Ambulatory Visit: Payer: Self-pay | Admitting: Internal Medicine

## 2008-10-21 ENCOUNTER — Ambulatory Visit: Payer: Self-pay | Admitting: Internal Medicine

## 2008-10-25 ENCOUNTER — Ambulatory Visit: Payer: Self-pay | Admitting: Internal Medicine

## 2008-10-25 ENCOUNTER — Telehealth (INDEPENDENT_AMBULATORY_CARE_PROVIDER_SITE_OTHER): Payer: Self-pay | Admitting: *Deleted

## 2008-10-25 LAB — CONVERTED CEMR LAB: Streptococcus, Group A Screen (Direct): NEGATIVE

## 2008-10-28 ENCOUNTER — Telehealth (INDEPENDENT_AMBULATORY_CARE_PROVIDER_SITE_OTHER): Payer: Self-pay | Admitting: *Deleted

## 2008-10-29 ENCOUNTER — Ambulatory Visit: Payer: Self-pay | Admitting: Internal Medicine

## 2008-10-29 ENCOUNTER — Telehealth (INDEPENDENT_AMBULATORY_CARE_PROVIDER_SITE_OTHER): Payer: Self-pay | Admitting: *Deleted

## 2008-10-29 DIAGNOSIS — J329 Chronic sinusitis, unspecified: Secondary | ICD-10-CM | POA: Insufficient documentation

## 2008-10-30 ENCOUNTER — Telehealth (INDEPENDENT_AMBULATORY_CARE_PROVIDER_SITE_OTHER): Payer: Self-pay | Admitting: *Deleted

## 2008-11-06 ENCOUNTER — Ambulatory Visit: Payer: Self-pay | Admitting: Internal Medicine

## 2008-11-06 ENCOUNTER — Telehealth: Payer: Self-pay | Admitting: Internal Medicine

## 2008-11-12 ENCOUNTER — Ambulatory Visit: Payer: Self-pay | Admitting: Pulmonary Disease

## 2008-11-12 ENCOUNTER — Ambulatory Visit: Payer: Self-pay | Admitting: Internal Medicine

## 2008-11-13 ENCOUNTER — Ambulatory Visit: Payer: Self-pay | Admitting: Internal Medicine

## 2008-11-20 ENCOUNTER — Ambulatory Visit: Payer: Self-pay | Admitting: Internal Medicine

## 2008-11-27 ENCOUNTER — Ambulatory Visit: Payer: Self-pay | Admitting: Internal Medicine

## 2008-11-28 ENCOUNTER — Ambulatory Visit: Payer: Self-pay | Admitting: Cardiovascular Disease

## 2008-12-03 ENCOUNTER — Ambulatory Visit: Payer: Self-pay | Admitting: Internal Medicine

## 2008-12-03 ENCOUNTER — Ambulatory Visit: Payer: Self-pay | Admitting: Cardiovascular Disease

## 2008-12-03 LAB — CONVERTED CEMR LAB
Albumin: 3.9 g/dL (ref 3.5–5.2)
BUN: 18 mg/dL (ref 6–23)
Bilirubin, Direct: 0.1 mg/dL (ref 0.0–0.3)
Calcium: 9.2 mg/dL (ref 8.4–10.5)
Creatinine, Ser: 0.9 mg/dL (ref 0.4–1.2)
GFR calc Af Amer: 80 mL/min
Glucose, Bld: 100 mg/dL — ABNORMAL HIGH (ref 70–99)
HDL: 52.4 mg/dL (ref >39.0)
Sodium: 142 meq/L (ref 135–145)
Total Protein: 6.9 g/dL (ref 6.0–8.3)
VLDL: 17 mg/dL (ref 0–40)

## 2008-12-10 ENCOUNTER — Ambulatory Visit: Payer: Self-pay | Admitting: Internal Medicine

## 2008-12-17 ENCOUNTER — Ambulatory Visit: Payer: Self-pay | Admitting: Internal Medicine

## 2008-12-23 ENCOUNTER — Ambulatory Visit: Payer: Self-pay | Admitting: Internal Medicine

## 2009-01-01 ENCOUNTER — Ambulatory Visit: Payer: Self-pay | Admitting: Internal Medicine

## 2009-01-07 ENCOUNTER — Ambulatory Visit: Payer: Self-pay | Admitting: Internal Medicine

## 2009-01-14 ENCOUNTER — Ambulatory Visit: Payer: Self-pay | Admitting: Internal Medicine

## 2009-01-21 ENCOUNTER — Ambulatory Visit: Payer: Self-pay | Admitting: Internal Medicine

## 2009-01-27 ENCOUNTER — Ambulatory Visit: Payer: Self-pay | Admitting: Internal Medicine

## 2009-02-05 ENCOUNTER — Ambulatory Visit: Payer: Self-pay | Admitting: Internal Medicine

## 2009-02-18 ENCOUNTER — Ambulatory Visit: Payer: Self-pay | Admitting: Internal Medicine

## 2009-02-25 ENCOUNTER — Ambulatory Visit: Payer: Self-pay | Admitting: Internal Medicine

## 2009-03-04 ENCOUNTER — Ambulatory Visit: Payer: Self-pay | Admitting: Internal Medicine

## 2009-03-11 ENCOUNTER — Ambulatory Visit: Payer: Self-pay | Admitting: Internal Medicine

## 2009-03-13 ENCOUNTER — Ambulatory Visit: Payer: Self-pay | Admitting: Internal Medicine

## 2009-03-14 ENCOUNTER — Encounter: Payer: Self-pay | Admitting: Internal Medicine

## 2009-03-17 ENCOUNTER — Ambulatory Visit: Payer: Self-pay | Admitting: Internal Medicine

## 2009-03-25 ENCOUNTER — Ambulatory Visit: Payer: Self-pay | Admitting: Internal Medicine

## 2009-04-02 ENCOUNTER — Telehealth (INDEPENDENT_AMBULATORY_CARE_PROVIDER_SITE_OTHER): Payer: Self-pay | Admitting: *Deleted

## 2009-04-03 ENCOUNTER — Ambulatory Visit: Payer: Self-pay | Admitting: Internal Medicine

## 2009-04-07 ENCOUNTER — Ambulatory Visit: Payer: Self-pay | Admitting: Internal Medicine

## 2009-04-15 ENCOUNTER — Ambulatory Visit: Payer: Self-pay | Admitting: Internal Medicine

## 2009-04-23 ENCOUNTER — Ambulatory Visit: Payer: Self-pay | Admitting: Internal Medicine

## 2009-04-28 ENCOUNTER — Ambulatory Visit: Payer: Self-pay | Admitting: Internal Medicine

## 2009-05-07 ENCOUNTER — Ambulatory Visit: Payer: Self-pay | Admitting: Internal Medicine

## 2009-05-13 ENCOUNTER — Ambulatory Visit: Payer: Self-pay | Admitting: Internal Medicine

## 2009-05-20 ENCOUNTER — Ambulatory Visit: Payer: Self-pay | Admitting: Internal Medicine

## 2009-05-26 ENCOUNTER — Ambulatory Visit: Payer: Self-pay | Admitting: Internal Medicine

## 2009-06-02 ENCOUNTER — Encounter: Payer: Self-pay | Admitting: Internal Medicine

## 2009-06-06 ENCOUNTER — Ambulatory Visit: Payer: Self-pay | Admitting: Internal Medicine

## 2009-06-10 ENCOUNTER — Ambulatory Visit: Payer: Self-pay | Admitting: Internal Medicine

## 2009-06-17 ENCOUNTER — Ambulatory Visit: Payer: Self-pay | Admitting: Internal Medicine

## 2009-06-24 ENCOUNTER — Ambulatory Visit: Payer: Self-pay | Admitting: Internal Medicine

## 2009-07-02 ENCOUNTER — Ambulatory Visit: Payer: Self-pay | Admitting: Internal Medicine

## 2009-07-09 ENCOUNTER — Ambulatory Visit: Payer: Self-pay | Admitting: Cardiovascular Disease

## 2009-07-09 LAB — CONVERTED CEMR LAB
Albumin: 3.8 g/dL (ref 3.5–5.2)
Chloride: 109 meq/L (ref 96–112)
Cholesterol: 150 mg/dL (ref 0–200)
HDL: 47.6 mg/dL (ref >39.00)
LDL Cholesterol: 85 mg/dL (ref 0–99)
Potassium: 3.9 meq/L (ref 3.5–5.1)
Sodium: 143 meq/L (ref 135–145)
Total Protein: 7.3 g/dL (ref 6.0–8.3)
Triglycerides: 89 mg/dL (ref 0.0–149.0)
VLDL: 17.8 mg/dL (ref 0.0–40.0)

## 2009-07-10 ENCOUNTER — Ambulatory Visit: Payer: Self-pay

## 2009-07-10 ENCOUNTER — Ambulatory Visit: Payer: Self-pay | Admitting: Cardiovascular Disease

## 2009-07-11 ENCOUNTER — Ambulatory Visit: Payer: Self-pay | Admitting: Internal Medicine

## 2009-07-14 ENCOUNTER — Ambulatory Visit: Payer: Self-pay | Admitting: Internal Medicine

## 2009-07-22 ENCOUNTER — Ambulatory Visit: Payer: Self-pay | Admitting: Internal Medicine

## 2009-07-30 ENCOUNTER — Ambulatory Visit: Payer: Self-pay | Admitting: Internal Medicine

## 2009-08-04 ENCOUNTER — Ambulatory Visit: Payer: Self-pay | Admitting: Internal Medicine

## 2009-08-06 ENCOUNTER — Telehealth (INDEPENDENT_AMBULATORY_CARE_PROVIDER_SITE_OTHER): Payer: Self-pay

## 2009-08-07 ENCOUNTER — Ambulatory Visit: Payer: Self-pay

## 2009-08-07 ENCOUNTER — Encounter: Payer: Self-pay | Admitting: Cardiology

## 2009-08-12 ENCOUNTER — Ambulatory Visit: Payer: Self-pay | Admitting: Internal Medicine

## 2009-08-18 ENCOUNTER — Ambulatory Visit: Payer: Self-pay | Admitting: Internal Medicine

## 2009-08-27 ENCOUNTER — Ambulatory Visit: Payer: Self-pay | Admitting: Internal Medicine

## 2009-09-01 ENCOUNTER — Ambulatory Visit: Payer: Self-pay | Admitting: Internal Medicine

## 2009-09-08 ENCOUNTER — Ambulatory Visit: Payer: Self-pay | Admitting: Internal Medicine

## 2009-09-17 ENCOUNTER — Ambulatory Visit: Payer: Self-pay | Admitting: Internal Medicine

## 2009-09-22 ENCOUNTER — Ambulatory Visit: Payer: Self-pay | Admitting: Internal Medicine

## 2009-09-29 ENCOUNTER — Ambulatory Visit: Payer: Self-pay | Admitting: Internal Medicine

## 2009-10-06 ENCOUNTER — Ambulatory Visit: Payer: Self-pay | Admitting: Internal Medicine

## 2009-10-15 ENCOUNTER — Ambulatory Visit: Payer: Self-pay | Admitting: Internal Medicine

## 2009-10-20 ENCOUNTER — Ambulatory Visit: Payer: Self-pay | Admitting: Internal Medicine

## 2009-10-22 ENCOUNTER — Telehealth: Payer: Self-pay | Admitting: Cardiovascular Disease

## 2009-10-27 ENCOUNTER — Ambulatory Visit: Payer: Self-pay | Admitting: Internal Medicine

## 2009-11-03 ENCOUNTER — Ambulatory Visit: Payer: Self-pay | Admitting: Internal Medicine

## 2009-11-04 ENCOUNTER — Telehealth: Payer: Self-pay | Admitting: Cardiovascular Disease

## 2009-11-11 ENCOUNTER — Ambulatory Visit: Payer: Self-pay | Admitting: Internal Medicine

## 2009-11-12 ENCOUNTER — Ambulatory Visit: Payer: Self-pay | Admitting: Internal Medicine

## 2009-11-18 ENCOUNTER — Ambulatory Visit: Payer: Self-pay | Admitting: Internal Medicine

## 2009-11-24 ENCOUNTER — Ambulatory Visit: Payer: Self-pay | Admitting: Internal Medicine

## 2009-12-01 ENCOUNTER — Ambulatory Visit: Payer: Self-pay | Admitting: Internal Medicine

## 2009-12-08 ENCOUNTER — Ambulatory Visit: Payer: Self-pay | Admitting: Internal Medicine

## 2009-12-16 ENCOUNTER — Ambulatory Visit: Payer: Self-pay | Admitting: Cardiovascular Disease

## 2009-12-16 ENCOUNTER — Ambulatory Visit: Payer: Self-pay | Admitting: Internal Medicine

## 2009-12-22 ENCOUNTER — Ambulatory Visit: Payer: Self-pay | Admitting: Internal Medicine

## 2009-12-22 ENCOUNTER — Ambulatory Visit: Payer: Self-pay | Admitting: Cardiovascular Disease

## 2009-12-22 LAB — CONVERTED CEMR LAB
ALT: 23 units/L (ref 0–35)
Albumin: 3.7 g/dL (ref 3.5–5.2)
Alkaline Phosphatase: 55 units/L (ref 39–117)
Bilirubin, Direct: 0.2 mg/dL (ref 0.0–0.3)
Cholesterol: 139 mg/dL (ref 0–200)
LDL Cholesterol: 74 mg/dL (ref 0–99)
Total Protein: 6.5 g/dL (ref 6.0–8.3)

## 2009-12-29 ENCOUNTER — Ambulatory Visit: Payer: Self-pay | Admitting: Internal Medicine

## 2009-12-31 ENCOUNTER — Encounter: Payer: Self-pay | Admitting: Internal Medicine

## 2010-01-05 ENCOUNTER — Ambulatory Visit: Payer: Self-pay | Admitting: Internal Medicine

## 2010-01-06 ENCOUNTER — Telehealth: Payer: Self-pay | Admitting: Cardiovascular Disease

## 2010-01-12 ENCOUNTER — Ambulatory Visit: Payer: Self-pay | Admitting: Internal Medicine

## 2010-01-20 ENCOUNTER — Ambulatory Visit: Payer: Self-pay | Admitting: Internal Medicine

## 2010-01-26 ENCOUNTER — Encounter: Payer: Self-pay | Admitting: Internal Medicine

## 2010-01-26 ENCOUNTER — Ambulatory Visit: Payer: Self-pay | Admitting: Internal Medicine

## 2010-02-02 ENCOUNTER — Ambulatory Visit: Payer: Self-pay | Admitting: Internal Medicine

## 2010-02-10 ENCOUNTER — Ambulatory Visit: Payer: Self-pay | Admitting: Internal Medicine

## 2010-02-16 ENCOUNTER — Ambulatory Visit: Payer: Self-pay | Admitting: Internal Medicine

## 2010-02-16 ENCOUNTER — Telehealth (INDEPENDENT_AMBULATORY_CARE_PROVIDER_SITE_OTHER): Payer: Self-pay | Admitting: *Deleted

## 2010-02-23 ENCOUNTER — Ambulatory Visit: Payer: Self-pay | Admitting: Internal Medicine

## 2010-03-09 ENCOUNTER — Ambulatory Visit: Payer: Self-pay | Admitting: Internal Medicine

## 2010-03-16 ENCOUNTER — Ambulatory Visit: Payer: Self-pay | Admitting: Internal Medicine

## 2010-03-19 ENCOUNTER — Ambulatory Visit: Payer: Self-pay | Admitting: Internal Medicine

## 2010-04-14 ENCOUNTER — Telehealth (INDEPENDENT_AMBULATORY_CARE_PROVIDER_SITE_OTHER): Payer: Self-pay | Admitting: *Deleted

## 2010-04-17 ENCOUNTER — Ambulatory Visit: Payer: Self-pay | Admitting: Internal Medicine

## 2010-04-17 ENCOUNTER — Telehealth (INDEPENDENT_AMBULATORY_CARE_PROVIDER_SITE_OTHER): Payer: Self-pay | Admitting: *Deleted

## 2010-04-21 ENCOUNTER — Ambulatory Visit: Payer: Self-pay | Admitting: Internal Medicine

## 2010-04-21 ENCOUNTER — Encounter: Payer: Self-pay | Admitting: Internal Medicine

## 2010-04-23 ENCOUNTER — Ambulatory Visit: Payer: Self-pay | Admitting: Nurse Practitioner

## 2010-04-23 ENCOUNTER — Telehealth: Payer: Self-pay | Admitting: Internal Medicine

## 2010-04-24 ENCOUNTER — Telehealth: Payer: Self-pay | Admitting: Nurse Practitioner

## 2010-04-27 LAB — CONVERTED CEMR LAB
AST: 22 units/L (ref 0–37)
BUN: 25 mg/dL — ABNORMAL HIGH (ref 6–23)
Basophils Relative: 0.4 % (ref 0.0–3.0)
CO2: 30 meq/L (ref 19–32)
Calcium: 9.3 mg/dL (ref 8.4–10.5)
Chloride: 101 meq/L (ref 96–112)
Creatinine, Ser: 0.9 mg/dL (ref 0.4–1.2)
Eosinophils Absolute: 0.3 10*3/uL (ref 0.0–0.7)
GFR calc non Af Amer: 63.91 mL/min (ref >60)
HCT: 45.5 % (ref 36.0–46.0)
Hemoglobin: 15.4 g/dL — ABNORMAL HIGH (ref 12.0–15.0)
Lymphocytes Relative: 32.9 % (ref 12.0–46.0)
Lymphs Abs: 3.4 10*3/uL (ref 0.7–4.0)
MCHC: 33.8 g/dL (ref 30.0–36.0)
Neutro Abs: 5.8 10*3/uL (ref 1.4–7.7)
RBC: 5.09 M/uL (ref 3.87–5.11)

## 2010-04-28 ENCOUNTER — Ambulatory Visit: Payer: Self-pay | Admitting: Internal Medicine

## 2010-04-28 ENCOUNTER — Encounter: Payer: Self-pay | Admitting: Nurse Practitioner

## 2010-05-02 ENCOUNTER — Telehealth (INDEPENDENT_AMBULATORY_CARE_PROVIDER_SITE_OTHER): Payer: Self-pay | Admitting: *Deleted

## 2010-05-04 ENCOUNTER — Ambulatory Visit: Payer: Self-pay | Admitting: Internal Medicine

## 2010-05-04 ENCOUNTER — Telehealth: Payer: Self-pay | Admitting: Internal Medicine

## 2010-05-04 ENCOUNTER — Telehealth: Payer: Self-pay | Admitting: Cardiovascular Disease

## 2010-05-11 ENCOUNTER — Ambulatory Visit: Payer: Self-pay | Admitting: Internal Medicine

## 2010-05-21 ENCOUNTER — Ambulatory Visit: Payer: Self-pay | Admitting: Internal Medicine

## 2010-05-26 ENCOUNTER — Ambulatory Visit: Payer: Self-pay | Admitting: Internal Medicine

## 2010-06-02 ENCOUNTER — Ambulatory Visit: Payer: Self-pay | Admitting: Internal Medicine

## 2010-06-10 ENCOUNTER — Ambulatory Visit: Payer: Self-pay | Admitting: Internal Medicine

## 2010-06-15 ENCOUNTER — Ambulatory Visit: Payer: Self-pay | Admitting: Internal Medicine

## 2010-06-22 ENCOUNTER — Ambulatory Visit: Payer: Self-pay | Admitting: Internal Medicine

## 2010-06-25 ENCOUNTER — Ambulatory Visit: Payer: Self-pay | Admitting: Cardiovascular Disease

## 2010-06-30 ENCOUNTER — Ambulatory Visit: Payer: Self-pay | Admitting: Internal Medicine

## 2010-07-08 ENCOUNTER — Ambulatory Visit: Payer: Self-pay | Admitting: Internal Medicine

## 2010-07-13 ENCOUNTER — Ambulatory Visit: Payer: Self-pay | Admitting: Internal Medicine

## 2010-07-21 ENCOUNTER — Ambulatory Visit: Payer: Self-pay | Admitting: Internal Medicine

## 2010-07-27 ENCOUNTER — Ambulatory Visit: Payer: Self-pay | Admitting: Internal Medicine

## 2010-07-29 ENCOUNTER — Encounter: Payer: Self-pay | Admitting: Internal Medicine

## 2010-08-03 ENCOUNTER — Ambulatory Visit: Payer: Self-pay | Admitting: Internal Medicine

## 2010-08-05 ENCOUNTER — Telehealth: Payer: Self-pay | Admitting: Cardiovascular Disease

## 2010-08-05 ENCOUNTER — Ambulatory Visit: Payer: Self-pay | Admitting: Internal Medicine

## 2010-08-10 ENCOUNTER — Ambulatory Visit: Payer: Self-pay | Admitting: Internal Medicine

## 2010-08-12 ENCOUNTER — Ambulatory Visit: Payer: Self-pay | Admitting: Internal Medicine

## 2010-08-13 ENCOUNTER — Encounter: Payer: Self-pay | Admitting: Internal Medicine

## 2010-08-18 ENCOUNTER — Ambulatory Visit: Payer: Self-pay | Admitting: Internal Medicine

## 2010-08-24 ENCOUNTER — Ambulatory Visit: Payer: Self-pay | Admitting: Internal Medicine

## 2010-08-24 ENCOUNTER — Telehealth (INDEPENDENT_AMBULATORY_CARE_PROVIDER_SITE_OTHER): Payer: Self-pay | Admitting: *Deleted

## 2010-08-31 ENCOUNTER — Telehealth (INDEPENDENT_AMBULATORY_CARE_PROVIDER_SITE_OTHER): Payer: Self-pay | Admitting: *Deleted

## 2010-08-31 ENCOUNTER — Ambulatory Visit: Payer: Self-pay | Admitting: Internal Medicine

## 2010-08-31 LAB — CONVERTED CEMR LAB
Basophils Relative: 0.7 % (ref 0.0–3.0)
Eosinophils Relative: 3.2 % (ref 0.0–5.0)
Lymphocytes Relative: 34.4 % (ref 12.0–46.0)
Monocytes Relative: 8.5 % (ref 3.0–12.0)
Neutrophils Relative %: 53.2 % (ref 43.0–77.0)
RBC: 4.44 M/uL (ref 3.87–5.11)
WBC: 7.3 10*3/uL (ref 4.5–10.5)

## 2010-09-01 ENCOUNTER — Telehealth (INDEPENDENT_AMBULATORY_CARE_PROVIDER_SITE_OTHER): Payer: Self-pay | Admitting: *Deleted

## 2010-09-01 ENCOUNTER — Ambulatory Visit: Payer: Self-pay | Admitting: Internal Medicine

## 2010-09-02 ENCOUNTER — Telehealth: Payer: Self-pay | Admitting: Internal Medicine

## 2010-09-04 ENCOUNTER — Telehealth: Payer: Self-pay | Admitting: Internal Medicine

## 2010-09-07 ENCOUNTER — Telehealth (INDEPENDENT_AMBULATORY_CARE_PROVIDER_SITE_OTHER): Payer: Self-pay | Admitting: *Deleted

## 2010-09-10 ENCOUNTER — Ambulatory Visit: Payer: Self-pay | Admitting: Internal Medicine

## 2010-09-10 ENCOUNTER — Telehealth (INDEPENDENT_AMBULATORY_CARE_PROVIDER_SITE_OTHER): Payer: Self-pay | Admitting: *Deleted

## 2010-09-14 ENCOUNTER — Ambulatory Visit: Payer: Self-pay | Admitting: Internal Medicine

## 2010-09-18 ENCOUNTER — Telehealth: Payer: Self-pay | Admitting: Internal Medicine

## 2010-09-22 ENCOUNTER — Ambulatory Visit: Payer: Self-pay | Admitting: Internal Medicine

## 2010-09-24 ENCOUNTER — Ambulatory Visit: Payer: Self-pay | Admitting: Internal Medicine

## 2010-09-28 ENCOUNTER — Ambulatory Visit: Payer: Self-pay | Admitting: Internal Medicine

## 2010-09-29 ENCOUNTER — Ambulatory Visit: Payer: Self-pay | Admitting: Internal Medicine

## 2010-10-06 ENCOUNTER — Ambulatory Visit: Payer: Self-pay | Admitting: Internal Medicine

## 2010-10-12 ENCOUNTER — Ambulatory Visit: Payer: Self-pay | Admitting: Internal Medicine

## 2010-10-14 ENCOUNTER — Telehealth (INDEPENDENT_AMBULATORY_CARE_PROVIDER_SITE_OTHER): Payer: Self-pay | Admitting: *Deleted

## 2010-10-21 ENCOUNTER — Ambulatory Visit: Payer: Self-pay | Admitting: Internal Medicine

## 2010-10-26 ENCOUNTER — Ambulatory Visit: Payer: Self-pay | Admitting: Cardiovascular Disease

## 2010-10-26 ENCOUNTER — Ambulatory Visit: Payer: Self-pay | Admitting: Internal Medicine

## 2010-10-26 ENCOUNTER — Telehealth: Payer: Self-pay | Admitting: Cardiovascular Disease

## 2010-10-26 ENCOUNTER — Encounter: Payer: Self-pay | Admitting: Cardiovascular Disease

## 2010-10-27 ENCOUNTER — Ambulatory Visit: Payer: Self-pay | Admitting: Cardiovascular Disease

## 2010-10-28 ENCOUNTER — Observation Stay (HOSPITAL_COMMUNITY)
Admission: RE | Admit: 2010-10-28 | Discharge: 2010-10-29 | Payer: Self-pay | Attending: Cardiovascular Disease | Admitting: Cardiovascular Disease

## 2010-10-28 ENCOUNTER — Ambulatory Visit
Admission: RE | Admit: 2010-10-28 | Discharge: 2010-10-28 | Disposition: A | Discharge status: Still In Hospital | Payer: Self-pay | Source: Home / Self Care | Attending: Cardiovascular Disease | Admitting: Cardiovascular Disease

## 2010-10-30 LAB — CONVERTED CEMR LAB
AST: 29 units/L (ref 0–37)
Alkaline Phosphatase: 47 units/L (ref 39–117)
BUN: 12 mg/dL (ref 6–23)
CK-MB: 4 ng/mL (ref 0.3–4.0)
Calcium: 9.2 mg/dL (ref 8.4–10.5)
Eosinophils Absolute: 0.2 10*3/uL (ref 0.0–0.7)
GFR calc non Af Amer: 88.95 mL/min (ref >60.00)
INR: 1 (ref 0.8–1.0)
MCHC: 33.5 g/dL (ref 30.0–36.0)
MCV: 88.3 fL (ref 78.0–100.0)
Monocytes Absolute: 0.4 10*3/uL (ref 0.1–1.0)
Neutrophils Relative %: 56.5 % (ref 43.0–77.0)
Platelets: 274 10*3/uL (ref 150.0–400.0)
Potassium: 3.4 meq/L — ABNORMAL LOW (ref 3.5–5.1)
Prothrombin Time: 10.3 s (ref 9.7–11.8)
RDW: 16.4 % — ABNORMAL HIGH (ref 11.5–14.6)
Sodium: 141 meq/L (ref 135–145)
TSH: 0.65 microintl units/mL (ref 0.35–5.50)
Total Bilirubin: 0.6 mg/dL (ref 0.3–1.2)

## 2010-11-02 ENCOUNTER — Ambulatory Visit: Payer: Self-pay | Admitting: Internal Medicine

## 2010-11-02 ENCOUNTER — Ambulatory Visit: Payer: Self-pay | Admitting: Gastroenterology

## 2010-11-02 ENCOUNTER — Telehealth (INDEPENDENT_AMBULATORY_CARE_PROVIDER_SITE_OTHER): Payer: Self-pay

## 2010-11-11 ENCOUNTER — Ambulatory Visit: Payer: Self-pay | Admitting: Internal Medicine

## 2010-11-11 DIAGNOSIS — E039 Hypothyroidism, unspecified: Secondary | ICD-10-CM

## 2010-11-11 DIAGNOSIS — M810 Age-related osteoporosis without current pathological fracture: Secondary | ICD-10-CM

## 2010-11-11 DIAGNOSIS — E785 Hyperlipidemia, unspecified: Secondary | ICD-10-CM

## 2010-11-11 LAB — CONVERTED CEMR LAB
Total CHOL/HDL Ratio: 3
Triglycerides: 63 mg/dL (ref 0.0–149.0)

## 2010-11-12 ENCOUNTER — Ambulatory Visit: Payer: Self-pay | Admitting: Internal Medicine

## 2010-11-13 ENCOUNTER — Ambulatory Visit: Payer: Self-pay | Admitting: Internal Medicine

## 2010-11-20 ENCOUNTER — Telehealth (INDEPENDENT_AMBULATORY_CARE_PROVIDER_SITE_OTHER): Payer: Self-pay | Admitting: *Deleted

## 2010-11-24 ENCOUNTER — Telehealth: Payer: Self-pay | Admitting: Nurse Practitioner

## 2010-11-24 ENCOUNTER — Telehealth: Payer: Self-pay | Admitting: Internal Medicine

## 2010-11-26 ENCOUNTER — Ambulatory Visit: Payer: Self-pay | Admitting: Oncology

## 2010-11-28 ENCOUNTER — Ambulatory Visit: Payer: Self-pay | Admitting: Internal Medicine

## 2010-12-01 ENCOUNTER — Telehealth (INDEPENDENT_AMBULATORY_CARE_PROVIDER_SITE_OTHER): Payer: Self-pay | Admitting: *Deleted

## 2010-12-04 ENCOUNTER — Ambulatory Visit: Payer: Self-pay | Admitting: Internal Medicine

## 2010-12-06 ENCOUNTER — Encounter: Payer: Self-pay | Admitting: Family Medicine

## 2010-12-06 ENCOUNTER — Encounter: Payer: Self-pay | Admitting: Thoracic Surgery (Cardiothoracic Vascular Surgery)

## 2010-12-08 ENCOUNTER — Ambulatory Visit: Payer: Self-pay | Admitting: Internal Medicine

## 2010-12-09 ENCOUNTER — Encounter: Payer: Self-pay | Admitting: Internal Medicine

## 2010-12-10 ENCOUNTER — Ambulatory Visit: Payer: Self-pay | Admitting: Internal Medicine

## 2010-12-15 ENCOUNTER — Ambulatory Visit: Payer: Self-pay | Admitting: Internal Medicine

## 2010-12-17 NOTE — Assessment & Plan Note (Signed)
Summary: ? sinus infection/mg   Primary Provider/Referring Provider:  Joselyn Arrow  CC:  Accute visit-? sinus infection-[place] cruise in 2 days.  History of Present Illness: Mar 19, 2010- COPD,  Not using oxygen in a long time.  She has continued allergy vaccine, noting that this Spring has been good,. She had been on allergy vacicine this time for several years. She is not using respiratory sprays in peak season . Asks check for cerumen, hoping she just has wax so she doesn't need her hearing aids.  August 24, 2010- COPD, Allergic Rhinitis, Rhinosiusitis, CAD/ Aortic Stenosis cc: Acute visit-? sinus infection-[place] cruise in 2 days. 4 days ago had onset of sneezing, then head congestion and rhinorhea without being outdoors much. Rapid onset. Then retroorbital headache. Using Neti pot. Twinges of pain in ears. Throat a little sore. No fever. Chest is clear. She is holding a script for a Zpak to take on her trip . She had loose stools after clarithromycin for sinusitis called in in June, so we want to avoid any discomforts on her trip.   Asthma History    Initial Asthma Severity Rating:    Age range: 12+ years    Symptoms: 0-2 days/week    Nighttime Awakenings: 0-2/month    Interferes w/ normal activity: no limitations    SABA use (not for EIB): 0-2 days/week    Asthma Severity Assessment: Intermittent   Preventive Screening-Counseling & Management  Alcohol-Tobacco     Smoking Status: never  Current Medications (verified): 1)  Metoprolol Tartrate 25 Mg Tabs (Metoprolol Tartrate) .... Take 1 By Mouth Two Times A Day 2)  Crestor 10 Mg  Tabs (Rosuvastatin Calcium) .... Take 1 Tablet By Mouth Once A Day 3)  Adult Aspirin Low Strength 81 Mg  Tbdp (Aspirin) .Marland Kitchen.. 1 Daily 4)  Zolpidem Tartrate 10 Mg  Tabs (Zolpidem Tartrate) .... Take 1/2 Tab By Mouth At Bedtime 5)  Caltrate 600 1500 Mg  Tabs (Calcium Carbonate) .... As Directed 6)  Senna-Plus 8.6-50 Mg  Tabs (Sennosides-Docusate Sodium)  .... Take 1 Tablet By Mouth Once A Day 7)  Multivitamins   Tabs (Multiple Vitamin) .... Take 1 Tablet By Mouth Once A Day 8)  Fosamax 70 Mg Tabs (Alendronate Sodium) .... Once A Week 9)  Coq10 50 Mg/2.5gm Emul (Coenzyme Q10) .... Once A Day 10)  Levothyroxine Sodium 75 Mcg Tabs (Levothyroxine Sodium) .... Take 1 Tablet By Mouth Once A Day 11)  Allergy Vaccine 1:10 Gh .... Restart At 0.1 and Build, After Lapse. 12)  Mobic 15 Mg Tabs (Meloxicam) .... 1/4 of The Tablet Daily 13)  Pantoprazole Sodium 40 Mg Tbec (Pantoprazole Sodium) .... Take 1 Tablet By Mouth Once A Day  Allergies (verified): 1)  ! Penicillin 2)  ! Morphine 3)  ! * Omnicep 4)  ! * Clarithromycin  Past History:  Past Medical History: Last updated: 04/23/2010 DIVERTICULITIS OF COLON (ICD-562.11) SORE THROAT (ICD-462) DYSPEPSIA (ICD-536.8) CONSTIPATION (ICD-564.00) DIVERTICULOSIS (ICD-562.10) GERD (ICD-530.81) BREAST CANCER (ICD-174.9) EROSIVE GASTRITIS (ICD-535.40) AORTIC STENOSIS (ICD-424.1), mild-moderate DVT (ICD-453.40) Hx of BRONCHITIS NOS (ICD-490) CORONARY ARTERY DISEASE (ICD-414.00) s/p multivessel CABG 2007 ASTHMA (ICD-493.90) ALLERGIC RHINITIS (ICD-477.9) RHINOSINUSITIS, RECURRENT (ICD-473.9)  Past Surgical History: Last updated: 04/23/2010 Breast-Mastectomy bilateral with reconstruction CABG 5 vessel Appendectomy right ankle arthroscopy Hysterectomy Tonsillectomy  Family History: Last updated: 03/27/2008 Family History of Colon Cancer:her sister Mother with breast cancer and lung disease. Father with emphysema. Siblings with coronary artery disease.  Social History: Last updated: 03/27/2008 Patient never smoked. Divorced,  lives alone in [place], teaches Sunday school, retired accountant, no alcohol.  Risk Factors: Smoking Status: never (08/24/2010)  Review of Systems      See HPI       The patient complains of shortness of breath with activity, nasal congestion/difficulty breathing  through nose, and sneezing.  The patient denies shortness of breath at rest, productive cough, non-productive cough, coughing up blood, chest pain, irregular heartbeats, acid heartburn, indigestion, loss of appetite, weight change, abdominal pain, difficulty swallowing, sore throat, tooth/dental problems, headaches, itching, rash, change in color of mucus, and fever.    Vital Signs:  Patient profile:   70 year old female Height:      65.5 inches Weight:      144.50 pounds BMI:     23.77 O2 Sat:      98 % on Room air Temp:     97 .1 degrees F oral Pulse rate:   69 / minute BP sitting:   140 / 68  (left arm) Cuff size:   regular  Vitals Entered By: Reynaldo Minium CMA (August 24, 2010 3:39 PM)  O2 Flow:  Room air CC: Accute visit-? sinus infection-[place] in 2 days   Physical Exam  Additional Exam:  General: A/Ox3; pleasant and cooperative, NAD, SKIN: no rash, lesions  NODES: no lymphadenopathy HEENT: [place]/AT, EOM- WNL, Conjuctivae- clear, PERRLA, TM-sleft TM a little retracted Right, Nose- sniffing, Throat-, red and glandular without drainage or exudate,  Mallampati  II NECK: Supple w/ fair ROM, JVD- none, normal carotid impulses w/o bruits Thyroid- normal to palpation CHEST: Clear to P&A,  HEART: RRR, 2/6 SEM- AS ABDOMEN: Soft and nl;  ZOX:WRUE, nl pulses, no edema  NEURO: Grossly intact to observation      Impression & Recommendations:  Problem # 1:  RHINOSINUSITIS, ACUTE (ICD-461.8) Main concern is that she will be flying, rooming with a smoker. URI with rhinitis and pharyngitis. We discussed treatment strategies. i will give neb neo and depo. She can take a Z pak now and carry one, and a script for prednisone to carry. She will schedule return to update her allergy skin tests.  Her updated medication list for this problem includes:    Zithromax Z-pak 250 Mg Tabs (Azithromycin) .Marland Kitchen... 2 today then one daily  Problem # 2:  Hx of BRONCHITIS NOS (ICD-490) Chest is clear  now. we are giving meds for travel partly recognizing possibility this illness could move down before it clears.  Her updated medication list for this problem includes:    Zithromax Z-pak 250 Mg Tabs (Azithromycin) .Marland Kitchen... 2 today then one daily  Problem # 3:  ASTHMA (ICD-493.90) Control of reactive airways disease has been quite good overall. No changes needed.  Medications Added to Medication List This Visit: 1)  Prednisone 10 Mg Tabs (Prednisone) .Marland Kitchen.. 1 tab four times daily x 2 days, 3 times daily x 2 days, 2 times daily x 2 days, 1 time daily x 2 days 2)  Zithromax Z-pak 250 Mg Tabs (Azithromycin) .... 2 today then one daily  Other Orders: Est. Patient Level III (45409) Prescription Created Electronically [phone]) Admin of Therapeutic Inj  intramuscular or subcutaneous (47829) Depo- Medrol 80mg  (J1040) Nebulizer Tx (56213)  Patient Instructions: 1)  Schedule return visit for allergy skin testing. Stop all antihistamines 3 days before skin testing, including cold and allergy meds, otc sleep and cough meds.  2)  Neb neo nasal 3)  depo 80 4)  script for Z pak- take now and also  carry one with you. 5)  script for prednisone to carry 6)  Consider otc decongestant Phenylephrine (Sudafed-PE and others) Prescriptions: ZITHROMAX Z-PAK 250 MG TABS (AZITHROMYCIN) 2 today then one daily  #1 pak x 0   Entered and Authorized by:   Waymon Budge MD   Signed by:   Waymon Budge MD on 08/24/2010   Method used:   Electronically to        [facility]  [phone]* (retail)       [address] [place], [place]  [address]       Ph: [phone] or [phone]       Fax: [phone]   RxID:   [id number] PREDNISONE 10 MG TABS (PREDNISONE) 1 tab four times daily x 2 days, 3 times daily x 2 days, 2 times daily x 2 days, 1 time daily x 2 days  #20 x 0   Entered and Authorized by:   Waymon Budge MD   Signed by:   Waymon Budge MD on 08/24/2010   Method used:   Electronically to         [facility]  [phone]* (retail)       [address] [place], [place]  [address]       Ph: [phone] or [phone]       Fax: [phone]   RxID:   [id number]      Medication Administration  Injection # 1:    Medication: Depo- Medrol 80mg     Diagnosis: RHINOSINUSITIS, ACUTE (ICD-461.8)    Route: SQ    Site: RUOQ gluteus    Exp Date: 02/2013    Lot #: [id number]    Mfr: Pharmacia    Patient tolerated injection without complications    Given by: Reynaldo Minium CMA (August 24, 2010 4:53 PM)  Medication # 1:    Medication: EMR miscellaneous medications    Diagnosis: RHINOSINUSITIS, ACUTE (ICD-461.8)    Dose: 3 drops    Route: intranasal    Exp Date: -11/2011    Lot #: [id number]    Mfr: Bayer    Comments: Neo-Synephrine    Patient tolerated medication without complications    Given by: Reynaldo Minium CMA (August 24, 2010 4:53 PM)  Orders Added: 1)  Est. Patient Level III [37628] 2)  Prescription Created Electronically [G8553] 3)  Admin of Therapeutic Inj  intramuscular or subcutaneous [96372] 4)  Depo- Medrol 80mg  [J1040] 5)  Nebulizer Tx [31517]

## 2010-12-17 NOTE — Progress Notes (Signed)
Summary: nasal passage stopped up   Phone Note Call from Patient   Caller: Patient Call For: young Summary of Call: pt still not any better nasal spray not helping and she would like to take avelox in the am instead of taking at night.she also would like to start taking prednisone now instead of waiting to start when she goes on her cruise. Initial call taken by: Rickard Patience,  September 01, 2010 9:10 AM  Follow-up for Phone Call        Pt calling because she is scheduled to go on a cruise on Thursday and she does not feel any better. I advised the pt is take s time for abx to take effect. Pt states that at OV on 08-25-10 she was given a rx for zpak and prednisone to take with her. Pt wants to know should she go ahead and start pred taper now instead of waiting? Pt also ask would she benefit from coming into offie and getting another neo neb treatment because her nose is now congested more so then it was yesterday at OV and the nasal spray only makes it worse. Please advise. Carron Curie CMA  September 01, 2010 11:11 AM allergies: PCN, Morphine, Omnicef, Clarythromycin  Additional Follow-up for Phone Call Additional follow up Details #1::        Spoke with pt-aware to be here today to see CDY-at his request and get Depo 80 and neo neb tx.Reynaldo Minium CMA  September 01, 2010 12:53 PM

## 2010-12-17 NOTE — Progress Notes (Signed)
Summary: zolipidem  Phone Note Call from Patient Call back at Home Phone [phone]   Caller: Patient Summary of Call: Pt call stating [facility]nt rec[facility]ved rx for zolipidem. Let pt know [facility] normallytakes up to 48 hours for [facility] to recieved and process to show up in there system. Ask pt if she was out can send 30 day to her local pharmacy. Pt states no does not need any to go to local pharmacy. also req copy of bloodwork that was done in dec. Mail to address Initial call taken by: Orlan Leavens RMA,  November 24, 2010 4:11 PM

## 2010-12-17 NOTE — Letter (Signed)
Summary: OV & labs/[facility]  OV & labs/[facility]   Imported By: Sherian Rein 11/26/2010 09:39:23  _____________________________________________________________________  External Attachment:    Type:   Image     Comment:   External Document

## 2010-12-17 NOTE — Assessment & Plan Note (Signed)
Summary: FLU SHOT/[facility]  Nurse Visit   Allergies: 1)  ! Penicillin 2)  ! Morphine 3)  ! * Omnicep  Orders Added: 1)  Flu Vaccine 12yrs + MEDICARE PATIENTS [Q2039] 2)  Administration Flu vaccine - MCR [G0008] Flu Vaccine Consent Questions     Do you have a history of severe allergic reactions to this vaccine? no    Any prior history of allergic reactions to egg and/or gelatin? no    Do you have a sensitivity to the preservative Thimersol? no    Do you have a past history of Guillan-Barre Syndrome? no    Do you currently have an acute febrile illness? no    Have you ever had a severe reaction to latex? no    Vaccine information given and explained to patient? yes    Are you currently pregnant? no    Lot Number:[id number]   Exp Date:05/15/2011   Site Given  Left Deltoid IM] Clarise Cruz [facility])  August 18, 2010 11:09 AM

## 2010-12-17 NOTE — Progress Notes (Signed)
Summary: SORE THROAT/ COUGH  Phone Note Call from Patient   Caller: Patient Call For: YOUNG Summary of Call: PT HAVE SORE THROAT AND COUGH [facility] Initial call taken by: Rickard Patience,  August 31, 2010 8:53 AM  Follow-up for Phone Call        called and spoke with pt.  pt was just seen by CY 08/24/2010 and given rx for z pak and pred taper and also takes allegra.  pt states she is still no better- sore throat and cough.  States she leaves on Thursday for a [place] cruise.  Scheduled pt to see CY today at 11am.  Arman Filter LPN  August 31, 2010 9:11 AM

## 2010-12-17 NOTE — Progress Notes (Signed)
Summary: coughing  Phone Note Call from Patient   Caller: Patient Call For: young Summary of Call: pt completed avelox coughing not any better [facility] Initial call taken by: Rickard Patience,  September 07, 2010 10:47 AM  Follow-up for Phone Call        called spoke with patient who c/o hurting/burning in chest, prod cough with green mucus, tightness in chest, hoarseness, rawness in through, fatigue.  still taking mucinex.  finished avelox friday.  states she has not taken any prednisone as suggested in 09-02-10 phone note.  please advise, thanks! Boone Master CNA/MA  September 07, 2010 12:14 PM   Additional Follow-up for Phone Call Additional follow up Details #1::        called and spoke with pt. per cdy the only thing left is for her to start taking the prednisone. i informed pt of that and she states she will start the prednisone and see how she does.  Carver Fila  September 07, 2010 1:52 PM

## 2010-12-17 NOTE — Procedures (Signed)
Summary: Colon   Colonoscopy  Procedure date:  02/08/2001  Findings:      Location:  [facility].   Patient Name: [name], [name]. MRN: [id number] Procedure Procedures: Colonoscopy CPT: [phone].    with biopsy. CPT: Q5068410.  Personnel: Endoscopist: [name]. Marina Goodell, MD.  Exam Location: Exam performed in Outpatient Clinic. Outpatient  Patient Consent: Procedure, Alternatives, Risks and Benefits discussed, consent obtained, from patient.  Indications  Increased Risk Screening: For family history of colorectal neoplasia, in  sibling age at onset: 67.  History  Pre-Exam Physical: Performed Feb 08, 2001. Cardio-pulmonary exam, Rectal exam, HEENT exam , Abdominal exam, Extremity exam, Neurological exam, Mental status exam WNL.  Exam Exam: Extent of exam reached: Cecum, extent intended: Cecum.  The cecum was identified by appendiceal orifice and IC valve. Patient position: left side to back. Colon retroflexion performed. Images taken. ASA Classification: II. Tolerance: excellent.  Monitoring: Pulse and BP monitoring, Oximetry used. Supplemental O2 given.  Colon Prep Used Golytely for colon prep. Prep results: excellent.  Sedation Meds: Demerol 100 mg. Versed 10 mg.  Findings MELANOSIS: Cecum.  - DIVERTICULOSIS: Sigmoid Colon. ICD9: Diverticulosis, Colon: 562.10.  - MELANOSIS: Cecum to Sigmoid Colon. Biopsy/Melanosis taken.   Assessment Abnormal examination, see findings above.  Diagnoses: 562.10: Diverticulosis, Colon.   Events  Unplanned Interventions: No intervention was required.  Unplanned Events: There were no complications. Plans Medication Plan: Referring provider to order medications.  Disposition: After procedure patient sent to recovery. After recovery patient sent home.  Scheduling/Referral: Colonoscopy, to [name]. Marina Goodell, MD, in 5 years given family hx.,    This report was created from the original endoscopy report, which was reviewed  and signed by the above listed endoscopist.

## 2010-12-17 NOTE — Progress Notes (Signed)
Summary: Resume allergy vaccine  Phone Note Call from Patient   Caller: Patient Call For: Dr.Young Details for Reason: restart vac.. Summary of Call: Just a reminder pt. is still interested in restarting her shots. (original note was signed) Spoke with pt. this morning" today is the first day I felt like I was among the living." Her last shot was 5-2-11it's just been 5 wks.;she was on 0.5 of 1:10. Can we start her at 0.1 of 1:10 or do we need to start at 0.1 of 1:50? Mrs.Bidinger would also like to know if she needs to keep taking allegra("I hope not it's expensive.") if you do decide to restart her shots. Please advise. CB H#[phone] C#[phone]  Initial call taken by: Dimas Millin,  April 17, 2010 11:16 AM  Follow-up for Phone Call        I have put allergy vaccine back on her list OK to restart here at 0.1 of 1:10, based on her previous script, and rebuild as tolerated.   She can take antihistamine of choice as needed. Generics like loratadine and fexofenadine will be cheaper. Follow-up by: Waymon Budge MD,  April 17, 2010 12:43 PM  Additional Follow-up for Phone Call Additional follow up Details #1::        I called Mrs.Allum back she will be coming in this afternoon to restart her all.shots and I told her your suggestion for the otc meds.. Additional Follow-up by: Dimas Millin,  April 17, 2010 1:45 PM    New/Updated Medications: * ALLERGY VACCINE 1:10 GH Restart at 0.1 and build, after lapse.

## 2010-12-17 NOTE — Miscellaneous (Signed)
Summary: Injection Record/[facility]  Injection Record/[facility]   Imported By: Sherian Rein 05/07/2010 08:26:19  _____________________________________________________________________  External Attachment:    Type:   Image     Comment:   External Document

## 2010-12-17 NOTE — Progress Notes (Signed)
  Patient called this am and is complaining of acid reflux. She states she is taking Protonix 40mg  daily. States she is having lots of burning in her chest with pain in her arm. States she has a cardiac history. Her discomfort is worse with exercise and exertion or any stress. Informed patient that is sounded like it was a cardiac issue. Patient states that she was worked up last week by her cardiologist and that her heart was fine. Patient would like to be seen. Patient given appointment for today at 2:00pm with Willette Cluster, RNP.

## 2010-12-17 NOTE — Cardiovascular Report (Signed)
Summary: Pre Cath Orders   Pre Cath Orders   Imported By: Roderic Ovens 11/04/2010 13:44:39  _____________________________________________________________________  External Attachment:    Type:   Image     Comment:   External Document

## 2010-12-17 NOTE — Progress Notes (Signed)
Summary: clear for surgery from notes on2/7  Phone Note From Other Clinic   Caller: tiffany office [phone]  Request: Talk with Nurse Details for Reason: From office notes on 2/7 can pt be clear for surgery for general anesthesiology Initial call taken by: Lorne Skeens,  January 06, 2010 1:49 PM  Follow-up for Phone Call        Left message at Dr Charlesetta Garibaldi office for Tiffany to call back. Julieta Gutting, RN, BSN  January 06, 2010 1:55 PM  I spoke with Dr Shon Hough and this pt recently had her breast implant rupture.  Dr Shon Hough is planning on replacing this implant.  The procedure would take 30-45 minutes under anesthesia.  Please fax note to [phone].  I will discuss this pt with Dr [name] for clearance.  Follow-up by: Julieta Gutting, RN, BSN,  January 06, 2010 4:15 PM  Additional Follow-up for Phone Call Additional follow up Details #1::        ok for surgery...see append to recent office note Additional Follow-up by: Norva Karvonen, MD,  January 12, 2010 1:30 PM

## 2010-12-17 NOTE — Progress Notes (Signed)
Summary: Rx refill req  Phone Note Refill Request Message from:  Patient on November 24, 2010 11:34 AM  Refills Requested: Medication #1:  ZOLPIDEM TARTRATE 10 MG  TABS take 1/2 tab by mouth at bedtime   Dosage confirmed as above?Dosage Confirmed   Supply Requested: 6 months   Notes: To [facility]  Method Requested: Fax to Mail Away Pharmacy Initial call taken by: Margaret Pyle, CMA,  November 24, 2010 11:35 AM  Follow-up for Phone Call        ok to fill as requested - will sign as needed - thanks Follow-up by: Newt Lukes MD,  November 24, 2010 11:46 AM  Additional Follow-up for Phone Call Additional follow up Details #1::        Rx faxed to [facility] pharmacy. Called pt no ansew LMOM rx faxed Additional Follow-up by: Orlan Leavens RMA,  November 24, 2010 12:01 PM    New/Updated Medications: ZOLPIDEM TARTRATE 10 MG  TABS (ZOLPIDEM TARTRATE) take 1/2 tab by mouth at bedtime ID [id number] Prescriptions: ZOLPIDEM TARTRATE 10 MG  TABS (ZOLPIDEM TARTRATE) take 1/2 tab by mouth at bedtime ID [id number]  #90 x 0   Entered by:   Orlan Leavens RMA   Authorized by:   Newt Lukes MD   Signed by:   Orlan Leavens RMA on 11/24/2010   Method used:   Printed then faxed to ...       [facility] (mail-order)             , [place]         Ph: [phone]       Fax: [phone]   RxID:   [id number]

## 2010-12-17 NOTE — Procedures (Signed)
Summary: EGD   EGD  Procedure date:  10/17/2006  Findings:      Location: [facility]   Patient Name: Mehr, Depaoli. MRN: [id number] Procedure Procedures: Panendoscopy (EGD) CPT: 43235.    with biopsy(s)/brushing(s). CPT: [id number].  Personnel: Endoscopist: Iva Boop, MD, [facility]  Referred By: Charlett Lango, MD.  Exam Location: Exam performed in Endoscopy Suite. Inpatient-ward  Patient Consent: Procedure, Alternatives, Risks and Benefits discussed, consent obtained, from patient. Consent was obtained by the RN.  Indications Symptoms: Nausea. Vomiting.  History  Current Medications: Patient is not currently taking Coumadin.  Allergies: Patient is allergic to MORPHINE, PENICILLIN.  Comments: PERSISTEN NAUSEA, VOMITING AFTER CABG (11/12). SOME ANOREXIA. Pre-Exam Physical: Performed Oct 17, 2006  Cardio-pulmonary exam, HEENT exam, Abdominal exam, Mental status exam WNL.  Comments: Pt. history reviewed/updated, physical exam performed prior to initiation of sedation? yes Exam Exam Info: Maximum depth of insertion Duodenum, intended Duodenum. Patient position: on left side. Gastric retroflexion performed. Images taken. ASA Classification: III. Tolerance: excellent.  Sedation Meds: Patient assessed and found to be appropriate for moderate (conscious) sedation. Fentanyl 50 mcg. given IV. Versed 5 mg. given IV. Cetacaine Spray 2 sprays given aerosolized.  Monitoring: BP and pulse monitoring done. Oximetry used. Supplemental O2 given  Fluoroscopy: Fluoroscopy was not used.  Findings - Normal: Proximal Esophagus to Distal Esophagus.  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 Z Line 40 cm from mouth, Biopsy/Barrett's taken. Comment: 5 MM TONGUE OF COLUMNAR MUCOSA, ? BARRETT'S.  - Normal: Fundus to Body.  - Normal: Duodenal Bulb to Duodenal 2nd Portion.  MUCOSAL ABNORMALITY: Antrum. Erosions present. Biopsy/Mucosal Abn taken. RUT done, results pending. Comment:  LINEAR EROSIN SEEN, H. PYLORI BIOPSY TAKEN.   Assessment  Comments: 1) MILD EROSIVE GASTRITIS  2) POSSIBLE SHORT-SEGMENT BARRETT'S ESOPHAGUS 3) OTHERWISE OK  I DON'T THINK THIS EXPLAINS HER SYMPTOMS, THOUGH I THINK GERD PROBABLY PART OF PROBLEM. Events  Unplanned Intervention: No unplanned interventions were required.  Plans Comments: CONTINUE PPI AND METACLOPRAMIDE Disposition: After procedure patient sent to recovery.  Comments: WILL FOLLOW-UP IN HOSPITAL This report was created from the original endoscopy report, which was reviewed and signed by the above listed endoscopist.

## 2010-12-17 NOTE — Progress Notes (Signed)
Summary: Condition update  Phone Note Call from Patient Call back at Home Phone [phone]   Caller: Patient Call For: Willette Cluster Reason for Call: Talk to Nurse Summary of Call: Update: Still no BM but feeling alot better Initial call taken by: Karna Christmas,  April 24, 2010 1:18 PM  Follow-up for Phone Call        Glad pain better. Okay, can resume her normal laxatives because I don't want her to get constipated. Labs looked fine. Call for appt. if needed. Thanks. Follow-up by: Willette Cluster NP,  April 26, 2010 12:56 PM    Additional Follow-up for Phone Call Additional follow up Details #2::    I LM on pt's voice mail with Jerryl Holzhauer's note.  I advised the pt that her labs looked fine and she can resume the laxatives because she doesn't need to get constipated.  I also advised her to call us if she has any questions or concerns. Follow-up by: Joselyn Glassman,  April 27, 2010 9:31 AM

## 2010-12-17 NOTE — Letter (Signed)
Summary: Joselyn Arrow MD  Joselyn Arrow MD   Imported By: Sherian Rein 11/26/2010 09:42:42  _____________________________________________________________________  External Attachment:    Type:   Image     Comment:   External Document

## 2010-12-17 NOTE — Progress Notes (Signed)
  Phone Note Call from Patient   Caller: Patient Reason for Call: Acute Illness Action Taken: Rx Called In Summary of Call: [name] called with c/o "sinusitis", feeling like she cant breath through her nose, stuffiness, sinus pressure.  Recently taken clarithromycin x2 doses and had "significant" diarrhea and had to visit GI.  Pt requesting prednisone and medication refill but not a med in same family as clarithro. Rx sent for pred taper and avelox.  Pt instructed to call Monday am for appointment in office or if worsens to report to ED or Urgent Care.  Initial call taken by: Canary Brim NP-C,  May 02, 2010 12:35 PM    New/Updated Medications: AVELOX 400 MG TABS (MOXIFLOXACIN HCL) take one tablet daily for 10 days PREDNISONE 10 MG TABS (PREDNISONE) take 4 tabs daily for 2 days, then 3 tabs daily for 2 days, then 2 tabs daily for 2 days, then 1 tab daily for 2 days then stop Prescriptions: PREDNISONE 10 MG TABS (PREDNISONE) take 4 tabs daily for 2 days, then 3 tabs daily for 2 days, then 2 tabs daily for 2 days, then 1 tab daily for 2 days then stop  #20 x 0   Entered and Authorized by:   Canary Brim NP-C   Signed by:   Canary Brim NP-C on 05/02/2010   Method used:   Electronically to        [facility]  [phone]* (retail)       [address] [place], [place]  [address]       Ph: [phone] or [phone]       Fax: [phone]   RxID:   [id number] AVELOX 400 MG TABS (MOXIFLOXACIN HCL) take one tablet daily for 10 days  #10 x 0   Entered and Authorized by:   Canary Brim NP-C   Signed by:   Canary Brim NP-C on 05/02/2010   Method used:   Electronically to        [facility]  [phone]* (retail)       [address] [place], [place]  [address]       Ph: [phone] or [phone]       Fax: [phone]   RxID:   [id number]

## 2010-12-17 NOTE — Assessment & Plan Note (Signed)
Summary: 6 MONTH   Visit Type:  6 months follow up Primary Provider:  Joselyn Arrow  CC:  Legs cramps and feet cramps.  History of Present Illness: 70 year-old woman with CAD s/p CABG in 2007 after she presented with NSTEMI. She presents today for follow-up evaluation. She has done well since CABG and has had no further ischemic events. Overall she is doing well at present. Complains of calf cramps at night, but not with exertion. No CP, dyspnea, orthopnea, PND, or palps.  She is active and keeps a very busy lifestyle.  Current Medications (verified): 1)  Metoprolol Tartrate 25 Mg Tabs (Metoprolol Tartrate) .... Take 1 By Mouth Two Times A Day 2)  Crestor 10 Mg  Tabs (Rosuvastatin Calcium) .... Take 1 Tablet By Mouth Once A Day 3)  Adult Aspirin Low Strength 81 Mg  Tbdp (Aspirin) .Marland Kitchen.. 1 Daily 4)  Zolpidem Tartrate 10 Mg  Tabs (Zolpidem Tartrate) .... Take 1/2 Tab By Mouth At Bedtime 5)  Caltrate 600 1500 Mg  Tabs (Calcium Carbonate) .... As Directed 6)  Senna-Plus 8.6-50 Mg  Tabs (Sennosides-Docusate Sodium) .... Take 1 Tablet By Mouth Once A Day 7)  Multivitamins   Tabs (Multiple Vitamin) .... Take 1 Tablet By Mouth Once A Day 8)  Allergy Vaccine 1:10  Gh .... Every Week 9)  Pantoprazole Sodium 40 Mg  Tbec (Pantoprazole Sodium) .Marland Kitchen.. 1 Once Daily 10)  Fosamax 70 Mg Tabs (Alendronate Sodium) .... Take 1 By Mouth Weekly 11)  Coq10 50 Mg/2.5gm Emul (Coenzyme Q10) .... Once A Day 12)  Levothyroxine Sodium 75 Mcg Tabs (Levothyroxine Sodium) .... Take 1 Tablet By Mouth Once A Day 13)  Mobic 15 Mg Tabs (Meloxicam) .... As Needed  Allergies: 1)  ! Penicillin 2)  ! Morphine 3)  ! * Omnicep  Past History:  Past medical history reviewed for relevance to current acute and chronic problems.  Past Medical History: DIVERTICULITIS OF COLON (ICD-562.11) SORE THROAT (ICD-462) FLATULENCE-GAS-BLOATING (ICD-787.3) DYSPEPSIA (ICD-536.8) CONSTIPATION (ICD-564.00) DIVERTICULOSIS (ICD-562.10) GERD  (ICD-530.81) BREAST CANCER (ICD-174.9) EROSIVE GASTRITIS (ICD-535.40) AORTIC STENOSIS (ICD-424.1), mild-moderate DVT (ICD-453.40) Hx of BRONCHITIS NOS (ICD-490) CORONARY ARTERY DISEASE (ICD-414.00) s/p multivessel CABG 2007 ASTHMA (ICD-493.90) ALLERGIC RHINITIS (ICD-477.9) RHINOSINUSITIS, RECURRENT (ICD-473.9)  Vital Signs:  Patient profile:   70 year old female Height:      65.5 inches Weight:      148.50 pounds BMI:     24.42 Pulse rate:   70 / minute Pulse rhythm:   regular Resp:     18 per minute BP sitting:   124 / 75  (right arm) Cuff size:   regular  Vitals Entered By: Vikki Ports (December 22, 2009 4:00 PM)  Physical Exam  General:  Pt is alert and oriented, in no acute distress. HEENT: normal Neck: normal carotid upstrokes with bilateral bruits, JVP normal Lungs: CTA CV: RRR with 2/6 harsh systolic murmur at the RUSB Abd: soft, NT, positive BS, no bruit, no organomegaly Ext: no clubbing, cyanosis, or edema. peripheral pulses 2+ and equal Skin: warm and dry without rash    EKG  Procedure date:  12/22/2009  Findings:      NSR, HR 67 bpm, cannot rule-out anterior MI age indeterminate.  Impression & Recommendations:  Problem # 1:  CORONARY ARTERY DISEASE (ICD-414.00) Pt is stable without angina. Continue ASA and metoprolol.  Her updated medication list for this problem includes:    Metoprolol Tartrate 25 Mg Tabs (Metoprolol tartrate) .Marland Kitchen... Take 1 by mouth two times  a day    Adult Aspirin Low Strength 81 Mg Tbdp (Aspirin) .Marland Kitchen... 1 daily  Orders: EKG w/ Interpretation (93000)  Problem # 2:  PURE HYPERCHOLESTEROLEMIA (ICD-272.0) Lipids are excellent on current Rx. Repeat in one year.  Her updated medication list for this problem includes:    Crestor 10 Mg Tabs (Rosuvastatin calcium) .Marland Kitchen... Take 1 tablet by mouth once a day  CHOL: 139 (12/16/2009)   LDL: 74 (12/16/2009)   HDL: 55.30 (12/16/2009)   TG: 49.0 (12/16/2009)  Patient Instructions: 1)  Your  physician recommends that you continue on your current medications as directed. Please refer to the Current Medication list given to you today. 2)  Your physician wants you to follow-up in:   6 MONTHS. You will receive a reminder letter in the mail two months in advance. If you don't receive a letter, please call our office to schedule the follow-up appointment.  Appended Document: 6 MONTH Pt stable from CV perspective. OK to proceed with surgery without further evaluation.

## 2010-12-17 NOTE — Progress Notes (Signed)
Summary: c/o cp & sob  Phone Note Call from Patient Call back at Home Phone [phone]   Caller: Patient Reason for Call: Talk to Nurse Complaint: Chest Pain, Breathing Problems Summary of Call: pt not feeling well today. can she come in today. c/o discomfort in chest. sob.  Initial call taken by: Lorne Skeens,  October 26, 2010 2:44 PM  Follow-up for Phone Call        I spoke with the pt and she has been under a lot of stress the past 2 weeks.  The pt c/o neck and shoulder pain and is using a heating pad at night. The pt c/o fatigue and her "heart is not doing right".  The pt had an allergy shot today and her pulse was 108.   The pt is also having reflux and this is similar to what she had prior to CABG.  I scheduled the pt to see Dr [name] today.   Follow-up by: Julieta Gutting, RN, BSN,  October 26, 2010 3:25 PM

## 2010-12-17 NOTE — Assessment & Plan Note (Signed)
Summary: diarrhea and abd pain x 1 week/pl          [name]   History of Present Illness Visit Type: Follow-up Visit Primary GI MD: Yancey Flemings MD Primary Provider: Joselyn Arrow Chief Complaint: diarrhea & abdominal pain x 1 week History of Present Illness:   This is a 70 year old white female followed by Dr. Marina Goodell for GERD, chronic constipation, bloating. She also has a history of hypothyroidism, hypertension,  hyperlipidemia and breast cancer. Last Friday (june 3rd), patient acutely developed diffuse abdominal pain and diarrhea. She had been given Biaxin on 04/14/10 for upper respiratory type symptoms but only took two doses as it made her feel funny. It has been almost a week and still doesn't feel well. Having 5-7 BMs a day, mostly pencil thin, mushy, foul smelling stool.  No rectal bleeding. She has intermittent lower abdominal pain througout the day. Pain radiates upward into upper abdomen. Never had this type of pain before. Stretching out flat helps but rolling on to her right side in bed causes cramps. She is tired, has poor appetite, and has lost 8 pounds this week.   Dysphagia Having problems with small pills sticking in esophagus but no problems swallowing food or fluid. Dr. Lynelle Doctor stopped Protonix and tried patient on Dexilant. Patient isn't sure it helped.  Never really had reflux or pyrosis, mainly just belching.   GI Review of Systems    Reports abdominal pain, bloating, loss of appetite, and  weight loss.     Location of  Abdominal pain: lower abdomen. Weight loss of 7 pounds over 1 week.   Denies acid reflux, belching, chest pain, dysphagia with liquids, dysphagia with solids, heartburn, nausea, vomiting, vomiting blood, and  weight gain.      Reports change in bowel habits, diarrhea, and  light color stool.     Denies anal fissure, black tarry stools, constipation, diverticulosis, fecal incontinence, heme positive stool, hemorrhoids, irritable bowel syndrome, jaundice, liver  problems, rectal bleeding, and  rectal pain.    Current Medications (verified): 1)  Metoprolol Tartrate 25 Mg Tabs (Metoprolol Tartrate) .... Take 1 By Mouth Two Times A Day 2)  Crestor 10 Mg  Tabs (Rosuvastatin Calcium) .... Take 1 Tablet By Mouth Once A Day 3)  Adult Aspirin Low Strength 81 Mg  Tbdp (Aspirin) .Marland Kitchen.. 1 Daily 4)  Zolpidem Tartrate 10 Mg  Tabs (Zolpidem Tartrate) .... Take 1/2 Tab By Mouth At Bedtime 5)  Caltrate 600 1500 Mg  Tabs (Calcium Carbonate) .... As Directed 6)  Senna-Plus 8.6-50 Mg  Tabs (Sennosides-Docusate Sodium) .... Take 1 Tablet By Mouth Once A Day 7)  Multivitamins   Tabs (Multiple Vitamin) .... Take 1 Tablet By Mouth Once A Day 8)  Boniva 150 Mg Tabs (Ibandronate Sodium) .... Take Once Monthly 9)  Coq10 50 Mg/2.5gm Emul (Coenzyme Q10) .... Once A Day 10)  Levothyroxine Sodium 75 Mcg Tabs (Levothyroxine Sodium) .... Take 1 Tablet By Mouth Once A Day 11)  Allergy Vaccine 1:10 Gh .... Restart At 0.1 and Build, After Lapse.  Allergies (verified): 1)  ! Penicillin 2)  ! Morphine 3)  ! Debbora Dus  Past History:  Past Medical History: DIVERTICULITIS OF COLON (ICD-562.11) SORE THROAT (ICD-462) DYSPEPSIA (ICD-536.8) CONSTIPATION (ICD-564.00) DIVERTICULOSIS (ICD-562.10) GERD (ICD-530.81) BREAST CANCER (ICD-174.9) EROSIVE GASTRITIS (ICD-535.40) AORTIC STENOSIS (ICD-424.1), mild-moderate DVT (ICD-453.40) Hx of BRONCHITIS NOS (ICD-490) CORONARY ARTERY DISEASE (ICD-414.00) s/p multivessel CABG 2007 ASTHMA (ICD-493.90) ALLERGIC RHINITIS (ICD-477.9) RHINOSINUSITIS, RECURRENT (ICD-473.9)  Past Surgical History: Breast-Mastectomy bilateral  with reconstruction CABG 5 vessel Appendectomy right ankle arthroscopy Hysterectomy Tonsillectomy  Family History: Reviewed history from 03/27/2008 and no changes required. Family History of Colon Cancer:her sister Mother with breast cancer and lung disease. Father with emphysema. Siblings with coronary artery  disease.  Social History: Reviewed history from 03/27/2008 and no changes required. Patient never smoked. Divorced, lives alone in [place], teaches Sunday school, retired accountant, no alcohol.  Vital Signs:  Patient profile:   69 year old female Height:      65.5 inches Weight:      141.13 pounds BMI:     23.21 Temp:     07 .6 degrees F Pulse rate:   76 / minute Pulse rhythm:   regular BP sitting:   114 / 62  (right arm) Cuff size:   regular  Vitals Entered By: June McMurray CMA Duncan Dull) (April 23, 2010 3:31 PM)  Physical Exam  General:  Well developed, well nourished, no acute distress. Head:  Normocephalic and atraumatic. Eyes:  Conjunctiva pink, no icterus.  Mouth:  No oral lesions. Tongue moist.  Neck:  no obvious masses  Lungs:  Clear throughout to auscultation. Heart:  RRR Abdomen:  Abdomen soft, nontender, nondistended. No obvious masses or hepatomegaly.Normal bowel sounds.  Extremities:  No palmar erythema, no edema.  Neurologic:  Alert and  oriented x4;  grossly normal neurologically. Skin:  Intact without significant lesions or rashes. Cervical Nodes:  No significant cervical adenopathy. Psych:  Alert and cooperative. Normal mood and affect.   Impression & Recommendations:  Problem # 1:  ABDOMINAL PAIN -GENERALIZED (ICD-789.07) Assessment New Six day history of diarrhea and lower abdominal discomfort radiating upwards. Though not usually associated wtih diarrhea, diverticulitis is a possibility given lower abdominal discomfort and tenderness. Could be infectious enteritis / colitis. She could have C-Difficile given recent antibiotics but would be surprising since she only had two doses of Biaxin. Patient looks okay but  is mild to moderately tender across lower abdomen. Will check stools studies, basic labs.  The patient will call our office tomorrow for condition update and if not better will start her on Flagyl . Patient will follow up with Dr. Marina Goodell. In the  interim,  she will be called with test results and any further recommendations based on those results.   Orders: TLB-CBC Platelet - w/Differential (85025-CBCD) TLB-CMP (Comprehensive Metabolic Pnl) (80053-COMP) TLB-TSH (Thyroid Stimulating Hormone) (84443-TSH) T-Culture, C-Diff Toxin A/B ([id number]) T-Culture, Stool ([id number]) T-Fecal WBC (09811-91478)  Problem # 2:  CONSTIPATION (ICD-564.00) Assessment: Unchanged Chronic constipation, normally on several stool softeners and laxatives a day. Obviously needs to hold these in setting of loose stools.   Problem # 3:  DIVERTICULOSIS (ICD-562.10) Assessment: Comment Only Full colonoscopy March 2007.  Problem # 4:  GERD (ICD-530.81) Assessment: Comment Only Never really had reflux or heartburn, mainly just belching. Continue PPI  Patient Instructions: 1)  Please go to lab, basement level. 2)  Please call us tomorrow with progress report.  If the pain is not any better,  we will call in the Flagyl. 3)  Hold laxative.  4)  Restart the Protonix. 5)  We made you a follow up appointment with Dr. Marina Goodell for 05-25-10.  6)  Appointment card given. 7)  The medication list was reviewed and reconciled.  All changed / newly prescribed medications were explained.  A complete medication list was provided to the patient / caregiver.

## 2010-12-17 NOTE — Progress Notes (Signed)
Summary: rsc allergy testing  Phone Note Call from Patient Call back at Home Phone [phone]   Caller: Patient Call For: [place] Summary of Call: pt wants to reschedule her allergy test (from current date of 12/7), but says the next avail in jan is too far out. pls advise.  Initial call taken by: Tivis Ringer, CNA,  October 14, 2010 11:48 AM  Follow-up for Phone Call        Will need to be put in next appt open for this.Reynaldo Minium CMA  October 14, 2010 3:52 PM   First available for this will be on 11/18/09.  Called pt to inform and had to [facility]  October 14, 2010 3:58 PM   Additional Follow-up for Phone Call Additional follow up Details #1::        Spoke with pt and offered appt for allergy skin test on 11/18/09- she states that she will not be able to come on this date and will need to call back to reschedule.  Additional Follow-up by: Vernie Murders,  October 14, 2010 5:08 PM

## 2010-12-17 NOTE — Progress Notes (Signed)
Summary: sinus and chest congestion  Phone Note Call from Patient   Caller: Patient Call For: young Summary of Call: pt have sinus problem and chest congestion . she would like to no if dr young can fill out form for insurance that she is unable to go on cruise tomorrow. Initial call taken by: Rickard Patience,  September 02, 2010 2:27 PM  Follow-up for Phone Call        called and spoke with pt.  pt states she is scheduled to go to [place] tomorrow on a cruise.  Pt states she just doesn't feel well enough to go.  Pt states she is now coughing up dark yellow sputum and feels dizzy.  Pt states she has trip insurance and wanted to know if CY will fill it out for her stating her "asthma/sinusitis has caused her to be disabled from going on trip."  Willl forward message to [facility] to address.  Arman Filter LPN  September 02, 2010 2:38 PM   Additional Follow-up for Phone Call Additional follow up Details #1::        Yes Additional Follow-up by: Waymon Budge MD,  September 02, 2010 4:38 PM    Additional Follow-up for Phone Call Additional follow up Details #2::    Called, spoke wiht pt.  She was informed CY will fill out forms for her.  States she will bring them by once she receives them.  She also states she is "hurting in my chest" and has chest congestion that she cannot get up.  Would like to know what she can take for this and if she should fill pred rx that was given to her for the trip.  Dr. Maple Hudson, pls advise.  Thanks! Gweneth Dimitri RN  September 02, 2010 4:44 PM   Additional Follow-up for Phone Call Additional follow up Details #3:: Details for Additional Follow-up Action Taken: I would favor mucinex and lots of fluids to thin mucus. If this isn't helping then she can take the prednisone taper. She is going to have to wait this out. There isn't a treatment that will make her well, and we don't want to add a bunch of medicine side effects to make her worse.    called and spoke with pt about CY  recs---she will try the mucinex first with plenty of water and if that does not work she will fill the pred taper--per CY he does not want to start her on a bunch of meds due to side effects--pt voiced her understanding of this Randell Loop CMA  September 02, 2010 5:01 PM  Additional Follow-up by: Waymon Budge MD,  September 02, 2010 4:53 PM

## 2010-12-17 NOTE — Progress Notes (Signed)
Summary: Allergy vaccine refill  Phone Note Call from Patient   Caller: Patient Call For: young Reason for Call: Refill Medication Summary of Call: Pt is scheduled for 01-06-11 at 930am for skin testing; please advise if okay to continue/refill current allergy vaccine as she will run out before the appt time/date. Thanks. Initial call taken by: Reynaldo Minium CMA,  December 01, 2010 4:54 PM  Follow-up for Phone Call        OK to refill.   Follow-up by: Waymon Budge MD,  December 02, 2010 8:52 AM

## 2010-12-17 NOTE — Assessment & Plan Note (Signed)
Summary: NEW/ MEDICARE/ [facility]   Vital Signs:  Patient profile:   70 year old female Height:      65.5 inches (166.37 cm) Weight:      141.4 pounds (64.27 kg) O2 Sat:      99 % on Room air Temp:     97.4 degrees F (36.33 degrees C) oral Pulse rate:   57 / minute BP sitting:   132 / 72  (left arm) Cuff size:   regular  Vitals Entered By: Orlan Leavens RMA (November 11, 2010 9:39 AM)  O2 Flow:  Room air CC: New patient Is Patient Diabetic? No Pain Assessment Patient in pain? no      Comments Pt was d/c from hosp on 10/29/10   Primary Care Provider:  Rene Paci, MD  CC:  New patient.  History of Present Illness: new pt to me and our division - here to est care  reviewed chronic med issues today -  1) CAD - s/p CABG 1997 - reports compliance with ongoing medical treatment and no changes in medication dose or frequency. denies adverse side effects related to current therapy. no angina or DOE, occ CP with recent cath due to same (10/2010 - report/hosp reviewed)  2) GERD - follows with GI for same - daily symptoms but controlled onnew nexium - ?if able to afford or change to alt PPI - no abd pain - no change in bowels -  3) hypothyroid, hx graves dz - reports compliance with ongoing medical treatment and no changes in medication dose or frequency. denies adverse side effects related to current therapy.   4) osteoporosis - on bisphos tx >30yr - no hx fx, no back or bone pain - concerned abpout taking med >28yr - also ?sticking sensation when swallowing small solids like pills- told may be related t bisphos tx - no dysphagia, no regurg - sticking ongong >39yrs  5) remote breast ca 1991, B mastect related to same - no onc f/u >51yr and would like to do so - annual mammo w/o abn  6) dyslipidemia- reports compliance with ongoing medical treatment and no changes in medication dose or frequency. denies adverse side effects related to current therapy. no myalgia or weakness  Preventive  Screening-Counseling & Management  Alcohol-Tobacco     Alcohol drinks/day: 0     Alcohol Counseling: not indicated; patient does not drink     Smoking Status: never     Tobacco Counseling: not indicated; no tobacco use  Caffeine-Diet-Exercise     Diet Counseling: not indicated; diet is assessed to be healthy     Does Patient Exercise: yes     Times/week: 4     Exercise Counseling: not indicated; exercise is adequate     Depression Counseling: not indicated; screening negative for depression  Safety-Violence-Falls     Seat Belt Counseling: not indicated; patient wears seat belts     Helmet Counseling: not indicated; patient wears helmet when riding bicycle/motocycle     Firearm Counseling: not applicable     Violence Counseling: not indicated; no violence risk noted     Fall Risk Counseling: not indicated; no significant falls noted  Clinical Review Panels:  Prevention   Last Colonoscopy:  #1 complete exam to the cecum. Excellent preparation #2 sigmoid diverticulosis. #3 small internal hemorrhoids. #4 no polyps, or other abnormalities. #5 followup in due March 2012 (01/31/2006)  Immunizations   Last Flu Vaccine:  Fluvax 3+ (08/05/2010)   Last H1N1 Vaccine 1:  H1N1  vaccine G code (11/13/2008)  Lipid Management   Cholesterol:  139 (12/16/2009)   LDL (bad choesterol):  74 (12/16/2009)   HDL (good cholesterol):  55.30 (12/16/2009)  CBC   WBC:  7.3 (10/26/2010)   RBC:  3.88 (10/26/2010)   Hgb:  11.5 (10/26/2010)   Hct:  34.3 (10/26/2010)   Platelets:  274.0 (10/26/2010)   MCV  88.3 (10/26/2010)   MCHC  33.5 (10/26/2010)   RDW  16.4 (10/26/2010)   PMN:  56.5 (10/26/2010)   Lymphs:  34.8 (10/26/2010)   Monos:  5.6 (10/26/2010)   Eosinophils:  2.2 (10/26/2010)   Basophil:  0.9 (10/26/2010)  Complete Metabolic Panel   Glucose:  92 (10/26/2010)   Sodium:  141 (10/26/2010)   Potassium:  3.4 (10/26/2010)   Chloride:  106 (10/26/2010)   CO2:  28 (10/26/2010)   BUN:  12  (10/26/2010)   Creatinine:  0.7 (10/26/2010)   Albumin:  4.0 (10/26/2010)   Total Protein:  6.6 (10/26/2010)   Calcium:  9.2 (10/26/2010)   Total Bili:  0.6 (10/26/2010)   Alk Phos:  47 (10/26/2010)   SGPT (ALT):  22 (10/26/2010)   SGOT (AST):  29 (10/26/2010)   Current Medications (verified): 1)  Metoprolol Tartrate 25 Mg Tabs (Metoprolol Tartrate) .... Take 1 By Mouth Two Times A Day 2)  Crestor 10 Mg  Tabs (Rosuvastatin Calcium) .... Take 1 Tablet By Mouth Once A Day 3)  Adult Aspirin Low Strength 81 Mg  Tbdp (Aspirin) .Marland Kitchen.. 1 Daily 4)  Zolpidem Tartrate 10 Mg  Tabs (Zolpidem Tartrate) .... Take 1/2 Tab By Mouth At Bedtime 5)  Caltrate 600 1500 Mg  Tabs (Calcium Carbonate) .... As Directed 6)  Senna-Plus 8.6-50 Mg  Tabs (Sennosides-Docusate Sodium) .... Take 1 Tablet By Mouth Once A Day 7)  Multivitamins   Tabs (Multiple Vitamin) .... Take 1 Tablet By Mouth Once A Day 8)  Fosamax 70 Mg Tabs (Alendronate Sodium) .... Once A Week 9)  Coq10 50 Mg/2.5gm Emul (Coenzyme Q10) .... Once A Day 10)  Levothyroxine Sodium 75 Mcg Tabs (Levothyroxine Sodium) .... Take 1 Tablet By Mouth Once A Day 11)  Allergy Vaccine 1:10 Gh .... Restart At 0.1 and Build, After Lapse. 12)  Mobic 15 Mg Tabs (Meloxicam) .... 1/4 of The Tablet Daily 13)  Pantoprazole Sodium 40 Mg Tbec (Pantoprazole Sodium) .... Take 1 Tablet By Mouth Once A Day 14)  Nexium 40 Mg Cpdr (Esomeprazole Magnesium) .... Take 1 Cap 30 Min Before Breakfast  Allergies (verified): 1)  ! Penicillin 2)  ! Morphine 3)  ! * Omnicep 4)  ! * Clarithromycin  Past History:  Past medical, surgical, family and social histories (including risk factors) reviewed, and no changes noted (except as noted below).  Past Medical History: DIVERTICULITIS OF COLON GERD BREAST CANCER, hx 1991 AORTIC STENOSIS, mild-moderate DVT hx CORONARY ARTERY DISEASE s/p multivessel CABG 2007 ASTHMA ALLERGIC RHINITIS RHINOSINUSITIS, RECURRENT   Hyperlipidemia Osteoporosis  MD roster: card - [facility] - [facility] allg/pulm -young derm - [facility]  ([facility])  Past Surgical History: Breast-Mastectomy bilateral with reconstruction 1992 CABG 5 vessel 1997 Appendectomy 1958 right ankle arthroscopy Hysterectomy 1990 Tonsillectomy 1959  Family History: Reviewed history from 03/27/2008 and no changes required. Family History of Colon Cancer:her sister Mother with breast cancer and lung disease. Father with emphysema. Siblings with coronary artery disease.  Social History: Reviewed history from 03/27/2008 and no changes required. Patient never smoked.  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 Does Patient Exercise:  yes  Review of Systems       see HPI above. I have reviewed all other systems and they were negative.   Physical Exam  General:  thin, alert, well-developed, well-nourished, and cooperative to examination.    Head:  Normocephalic and atraumatic without obvious abnormalities. No apparent alopecia or balding. Eyes:  vision grossly intact; pupils equal, round and reactive to light.  conjunctiva and lids normal.    Ears:  HOH (does not wear hearing aides but has pair at home) - normal pinnae bilaterally, without erythema, swelling, or tenderness to palpation. TMs clear, without effusion, or cerumen impaction.  Mouth:  teeth and gums in good repair; mucous membranes moist, without lesions or ulcers. oropharynx clear without exudate, no erythema.  Neck:  supple, full ROM, no masses, no thyromegaly; no thyroid nodules or tenderness. no JVD or carotid bruits.   Lungs:  normal respiratory effort, no intercostal retractions or use of accessory muscles; normal breath sounds bilaterally - no crackles and no wheezes.    Heart:  normal rate, regular rhythm, no murmur, and no rub. BLE without edema. normal DP pulses and normal cap refill in  all 4 extremities    Abdomen:  soft, non-tender, normal bowel sounds, no distention; no masses and no appreciable hepatomegaly or splenomegaly.   Genitalia:  defer to gyn Msk:  No deformity or scoliosis noted of thoracic or lumbar spine.   Neurologic:  alert & oriented X3 and cranial nerves II-XII symetrically intact.  strength normal in all extremities, sensation intact to light touch, and gait normal. speech fluent without dysarthria or aphasia; follows commands with good comprehension.  Skin:  no rashes, vesicles, ulcers, or erythema. No nodules or irregularity to palpation.  Psych:  Oriented X3, memory intact for recent and remote, normally interactive, good eye contact, not anxious appearing, not depressed appearing, and not agitated.      Impression & Recommendations:  Problem # 1:  HYPOTHYROIDISM (ICD-244.9) send for records about graves hx - check labs now - adjust med as needed  Her updated medication list for this problem includes:    Levothyroxine Sodium 75 Mcg Tabs (Levothyroxine sodium) .Marland Kitchen... Take 1 tablet by mouth once a day  Orders: TLB-TSH (Thyroid Stimulating Hormone) (84443-TSH)  Labs Reviewed: TSH: 0.65 (10/26/2010)    Chol: 139 (12/16/2009)   HDL: 55.30 (12/16/2009)   LDL: 74 (12/16/2009)   TG: 49.0 (12/16/2009)  Problem # 2:  OSTEOPOROSIS (ICD-733.00) bisphos tx reported >61yr, no fx hx -  send for records re: prior dexa ok to hold until further review - esp considering poss gi swallow symptoms -  cont Ca+D Her updated medication list for this problem includes:    Fosamax 70 Mg Tabs (Alendronate sodium) ..... Once a week - hold until further notice  Problem # 3:  HYPERLIPIDEMIA (ICD-272.4)  Her updated medication list for this problem includes:    Crestor 10 Mg Tabs (Rosuvastatin calcium) .Marland Kitchen... Take 1 tablet by mouth once a day  Orders: TLB-Lipid Panel (80061-LIPID)  Labs Reviewed: SGOT: 29 (10/26/2010)   SGPT: 22 (10/26/2010)   HDL:55.30 (12/16/2009),  47.60 (07/09/2009)  LDL:74 (12/16/2009), 85 (07/09/2009)  Chol:139 (12/16/2009), 150 (07/09/2009)  Trig:49.0 (12/16/2009), 89.0 (07/09/2009)  Problem # 4:  CORONARY ARTERY DISEASE (ICD-414.00)  CABG 1997 - recent cath related to CP symptoms done [facility] 10/28/10 (report reviewed) - widely patent 5/5 grafts, normal LVfx cont med mgmt and GI eval  for other causes of CP symptoms as ongoing Her updated medication list for this problem includes:    Metoprolol Tartrate 25 Mg Tabs (Metoprolol tartrate) .Marland Kitchen... Take 1 by mouth two times a day    Adult Aspirin Low Strength 81 Mg Tbdp (Aspirin) .Marland Kitchen... 1 daily  Labs Reviewed: Chol: 139 (12/16/2009)   HDL: 55.30 (12/16/2009)   LDL: 74 (12/16/2009)   TG: 49.0 (12/16/2009)  Problem # 5:  GERD (ICD-530.81) nexium trial onging - mgmt per GI and stop bisphos as discussed above The following medications were removed from the medication list:    Pantoprazole Sodium 40 Mg Tbec (Pantoprazole sodium) .Marland Kitchen... Take 1 tablet by mouth once a day Her updated medication list for this problem includes:    Nexium 40 Mg Cpdr (Esomeprazole magnesium) .Marland Kitchen... Take 1 cap 30 min before breakfast  EGD: #1 question short segment Barrett's esophagus. Biopsies negative. #2 mild nonspecific gastritis. #3 otherwise, normal  (10/17/2006)  Labs Reviewed: Hgb: 11.5 (10/26/2010)   Hct: 34.3 (10/26/2010)  Problem # 6:  BREAST CANCER (ICD-174.9)  hx same 1991 and B mastectomy 1992 - reports neg mammo but concerned that she has had no onc f/u since dr. Cleone Slim retired - refer to onc now for same to help reassure pt -  Orders: Oncology Referral (Oncology)  Complete Medication List: 1)  Metoprolol Tartrate 25 Mg Tabs (Metoprolol tartrate) .... Take 1 by mouth two times a day 2)  Crestor 10 Mg Tabs (Rosuvastatin calcium) .... Take 1 tablet by mouth once a day 3)  Adult Aspirin Low Strength 81 Mg Tbdp (Aspirin) .Marland Kitchen.. 1 daily 4)  Zolpidem Tartrate 10 Mg Tabs (Zolpidem tartrate) .... Take 1/2  tab by mouth at bedtime 5)  Caltrate 600 1500 Mg Tabs (Calcium carbonate) .... As directed 6)  Senna-plus 8.6-50 Mg Tabs (Sennosides-docusate sodium) .... Take 1 tablet by mouth once a day 7)  Multivitamins Tabs (Multiple vitamin) .... Take 1 tablet by mouth once a day 8)  Fosamax 70 Mg Tabs (Alendronate sodium) .... Once a week - hold until further notice 9)  Coq10 50 Mg/2.5gm Emul (Coenzyme q10) .... Once a day 10)  Levothyroxine Sodium 75 Mcg Tabs (Levothyroxine sodium) .... Take 1 tablet by mouth once a day 11)  Allergy Vaccine 1:10 Gh  .... Restart at 0.1 and build, after lapse. 12)  Mobic 15 Mg Tabs (Meloxicam) .... 1/4 of the tablet daily 13)  Nexium 40 Mg Cpdr (Esomeprazole magnesium) .... Take 1 cap 30 min before breakfast  Other Orders: Gynecologic Referral (Gyn)  Patient Instructions: 1)  it was good to see you today. 2)  your medical history and recent cardiac+GI evaluations and medication have been reviewed today 3)  test(s) ordered today - your results will be posted on the phone tree for review in 48-72 hours from the time of test completion; call [phone] and enter your 9 digit MRN (listed above on this page, just below your name); if any changes need to be made or there are abnormal results, you will be contacted directly.  4)  will send for records from [facility] as discussed 5)  hold fosamax until we further review the last bone density scan - and talk with paula about the sticking with swallowing sensation 6)  we'll make referral to oncologist and gynecologist. Our office will contact you regarding this appointment once made.  7)  Please schedule a follow-up appointment in 3 months to review further, call sooner if problems.    Orders Added: 1)  TLB-Lipid Panel [80061-LIPID] 2)  TLB-TSH (Thyroid Stimulating Hormone) [84443-TSH] 3)  New Patient Level IV [16109] 4)  Oncology Referral [Oncology] 5)  Gynecologic Referral [Gyn]     Cardiac Cath  Procedure date:   10/28/2010  Findings:      Conclusion: 1. Severe native three-vessel coranry artery disease 2.Status post coronary bypass surgery with 5/5 grafts widely patent 3. Normal left ventricular systolic function

## 2010-12-17 NOTE — Progress Notes (Signed)
Summary: rx to expensive  Phone Note Call from Patient Call back at Home Phone [phone]   Caller: Patient Reason for Call: Talk to Nurse Summary of Call: pt called in - did not pick up Avelox.  Cost $ 85.00 - Doctor gave her Chlorithomiacin. She's taking that. Initial call taken by: Eugene Gavia,  May 04, 2010 2:27 PM  Follow-up for Phone Call        Spoke with pt.  Pt states she was recently put on Clarithromycin but called in on Saturday because the clarithromycin was giving her diarhea.  Avelox was called in by on call NP.  Pt states she did not pick up the avelox because it was too expensive.  Currently breaking the calrithromycin in half and will finish taking it like that.  States this seems to be working.  WIll forward message to CY as FYI.  Gweneth Dimitri RN  May 04, 2010 2:41 PM   Additional Follow-up for Phone Call Additional follow up Details #1::        Noted Additional Follow-up by: Waymon Budge MD,  May 07, 2010 8:56 AM

## 2010-12-17 NOTE — Progress Notes (Signed)
Summary: lab work  Phone Note Call from Patient Call back at [employer] [phone]   Caller: Patient Reason for Call: Talk to Nurse Summary of Call: request lab work prior to appt in August Initial call taken by: Migdalia Dk,  May 04, 2010 1:42 PM  Follow-up for Phone Call        Pt is not due for repeat labwork until 12/2010.  Left message for pt to callback. Julieta Gutting, RN, BSN  May 04, 2010 3:18 PM  I spoke with the pt and made her aware that at this time she is not due for repeat bloodwork.  The pt is scheduled to see Dr [name] in August.  Follow-up by: Julieta Gutting, RN, BSN,  May 05, 2010 2:49 PM

## 2010-12-17 NOTE — Progress Notes (Signed)
Summary: [facility]  Phone Note Refill Request   Refills Requested: Medication #1:  METOPROLOL TARTRATE 25 MG TABS take 1 by mouth two times a day   [facility] [address]   Method Requested: Fax to Local Pharmacy Initial call taken by: Migdalia Dk,  February 16, 2010 9:28 AM Caller: Patient  Follow-up for Phone Call        Rx faxed to pharmacy Follow-up by: Vikki Ports,  February 16, 2010 11:12 AM    Prescriptions: METOPROLOL TARTRATE 25 MG TABS (METOPROLOL TARTRATE) take 1 by mouth two times a day  #8 x 0   Entered by:   Vikki Ports   Authorized by:   Norva Karvonen, MD   Signed by:   Vikki Ports on 02/16/2010   Method used:   [facility] to ...       [facility]  [phone]* (retail)       [address] [place], [place]  [address]       Ph: [phone] or [phone]       Fax: [phone]   RxID:   [id number]

## 2010-12-17 NOTE — Medication Information (Signed)
Summary: Pantoprazole/[facility]  Pantoprazole/[facility]   Imported By: Sherian Rein 08/03/2010 08:14:14  _____________________________________________________________________  External Attachment:    Type:   Image     Comment:   External Document

## 2010-12-17 NOTE — Letter (Signed)
Summary: Cardiac Catheterization Instructions- [facility]  [facility], Main Office  [address]   [place], [place] [address]   Phone: [phone]  Fax: [phone]     10/26/2010 MRN: [id number]  [name] [address] [place], [place]  [address]  Dear Ms. Santoro,   You are scheduled for a Cardiac Catheterization on Wednesday October 28, 2010 with Dr. [name].  Please arrive to the 1st floor of the Heart and Vascular Center at [facility] at 7:30 am on the day of your procedure. Please do not arrive before 6:30 a.m. Call the Heart and Vascular Center at [phone] if you are unable to make your appointmnet. The Code to get into the parking garage under the building is 0003. Take the elevators to the 1st floor. You must have someone to drive you home. Someone must be with you for the first 24 hours after you arrive home. Please wear clothes that are easy to get on and off and wear slip-on shoes. Do not eat or drink after midnight except water with your medications that morning. Bring all your medications and current insurance cards with you.  _X_ Make sure you take your aspirin.  _X__ You may take ALL of your medications with water that morning.  The usual length of stay after your procedure is 2 to 3 hours. This can vary.  If you have any questions, please call the office at the number listed above.   Julieta Gutting, RN, BSN

## 2010-12-17 NOTE — Miscellaneous (Signed)
Summary: Injection Record/[facility]  Injection Record/[facility]   Imported By: Sherian Rein 03/19/2010 14:49:34  _____________________________________________________________________  External Attachment:    Type:   Image     Comment:   External Document

## 2010-12-17 NOTE — Progress Notes (Signed)
  Phone Note Other Incoming   Request: Send information Summary of Call: Records received from Dr. Lynelle Doctor. Request forwarded to [facility].      Appended Document: med rec from Dr. Lynelle Doctor     Preload Clinical Lists Allergies added:  ! LEVAQUIN ! NAPROSYN ! * TEQUIN ! CODEINE  Past History:  Past Medical History: DIVERTICULITIS OF COLON GERD BREAST CANCER, hx 1991 AORTIC STENOSIS, mild-moderate DVT hx CORONARY ARTERY DISEASE s/p multivessel CABG 2007 ASTHMA ALLERGIC RHINITIS RHINOSINUSITIS, RECURRENT  Hyperlipidemia Osteoporosis  MD roster: card - [facility] - [facility] allg/pulm -young derm - [facility] onc  ([facility])  Hypothyroidism  Current Medications (verified): 1)  Metoprolol Tartrate 25 Mg Tabs (Metoprolol Tartrate) .... Take 1 By Mouth Two Times A Day 2)  Crestor 10 Mg  Tabs (Rosuvastatin Calcium) .... Take 1 Tablet By Mouth Once A Day 3)  Adult Aspirin Low Strength 81 Mg  Tbdp (Aspirin) .Marland Kitchen.. 1 Daily 4)  Zolpidem Tartrate 10 Mg  Tabs (Zolpidem Tartrate) .... Take 1/2 Tab By Mouth At Bedtime 5)  Caltrate 600 1500 Mg  Tabs (Calcium Carbonate) .... As Directed 6)  Senna-Plus 8.6-50 Mg  Tabs (Sennosides-Docusate Sodium) .... Take 1 Tablet By Mouth Once A Day 7)  Multivitamins   Tabs (Multiple Vitamin) .... Take 1 Tablet By Mouth Once A Day 8)  Fosamax 70 Mg Tabs (Alendronate Sodium) .... Once A Week - Hold Until Further Notice 9)  Coq10 50 Mg/2.5gm Emul (Coenzyme Q10) .... Once A Day 10)  Levothyroxine Sodium 75 Mcg Tabs (Levothyroxine Sodium) .... Take 1 Tablet By Mouth Once A Day 11)  Allergy Vaccine 1:10 Gh .... Restart At 0.1 and Build, After Lapse. 12)  Mobic 15 Mg Tabs (Meloxicam) .... 1/4 of The Tablet Daily 13)  Nexium 40 Mg Cpdr (Esomeprazole Magnesium) .... Take 1 Cap 30 Min Before Breakfast  Allergies: 1)  ! Penicillin 2)  ! Morphine 3)  ! * Omnicep 4)  ! * Clarithromycin 5)  ! Levaquin 6)  ! Naprosyn 7)  ! * Tequin 8)  !  Codeine       Cardiac Cath  Procedure date:  10/28/2010  Findings:      conclusion 1. severte native three-vessell coronary artery disease 2. status post coronary bypass surgery with 5/5 grafts widely patent 3. Normal left ventricualr systolic function  Bone Density  Procedure date:  03/14/2009  Findings:      Done @ [facility]   Comments:      Assessment:  Osteoporosis.

## 2010-12-17 NOTE — Miscellaneous (Signed)
Summary: Injection record/[facility]  Injection record/[facility]   Imported By: Sherian Rein 04/07/2010 13:46:33  _____________________________________________________________________  External Attachment:    Type:   Image     Comment:   External Document

## 2010-12-17 NOTE — Letter (Signed)
Summary: OV & labs/[facility]  OV & labs/[facility]   Imported By: Sherian Rein 11/26/2010 09:40:35  _____________________________________________________________________  External Attachment:    Type:   Image     Comment:   External Document

## 2010-12-17 NOTE — Progress Notes (Signed)
Summary: How much will medicare pay?  Phone Note Call from Patient   Caller: Patient Call For: [name] Summary of Call: [name] called yesterday asking if medicare would pay for allergy skin test. Dr. Maple Hudson said she needs to be retested. I told her they may pay  for some of it but I don't know how much. I know the test is $3,000.00 or more. Please call her when you have time. If not just call me and let me know how much the test is and what percentage medicare will cover. Thanks! Initial call taken by: Dimas Millin,  September 10, 2010 2:54 PM  Follow-up for Phone Call        Phone Call Completed Follow-up by: Lorenza Evangelist,  October 01, 2010 10:05 AM

## 2010-12-17 NOTE — Procedures (Signed)
Summary: colon   Colonoscopy  Procedure date:  01/31/2006  Findings:      Location:  [facility].    Colonoscopy  Procedure date:  01/31/2006  Findings:      Location:  [facility].   Patient Name: [name], [name]. MRN: [id number] Procedure Procedures: Colonoscopy CPT: [phone].  Personnel: Endoscopist: [name]. Marina Goodell, MD.  Exam Location: Exam performed in Outpatient Clinic. Outpatient  Patient Consent: Procedure, Alternatives, Risks and Benefits discussed, consent obtained, from patient. Consent was obtained by the RN.  Indications  Increased Risk Screening: Personal history of breast cancer. For family history of colorectal neoplasia, in  sibling SIBLING  History  Current Medications: Patient is not currently taking Coumadin.  Pre-Exam Physical: Performed Jan 31, 2006. Cardio-pulmonary exam, Rectal exam, Abdominal exam, Mental status exam WNL.  Comments: Pt. history reviewed/updated, physical exam performed prior to initiation of sedation? YES Exam Exam: Extent of exam reached: Cecum, extent intended: Cecum.  The cecum was identified by appendiceal orifice and IC valve. Patient position: on left side. The Cecum was reached at 11:49 AM. ended at 11:59 AM. Colon retroflexion performed. Images taken. ASA Classification: II. Tolerance: excellent.  Monitoring: Pulse and BP monitoring, Oximetry used. Supplemental O2 given.  Colon Prep Used MIRALAX for colon prep. Prep results: excellent.  Sedation Meds: Patient assessed and found to be appropriate for moderate (conscious) sedation. Fentanyl 100 mcg. given IV. Versed 12 given IV.  Findings MELANOSIS: Cecum to Rectum.  - DIVERTICULOSIS: Sigmoid Colon. ICD9: Diverticulosis, Colon: 562.10.   Assessment  Diagnoses: 562.10: Diverticulosis, Colon.   Comments: NO POLYPS SEEN Events  Unplanned Interventions: No intervention was required.  Unplanned Events: There were no  complications. Plans Disposition: After procedure patient sent to recovery. After recovery patient sent home.  Scheduling/Referral: Colonoscopy, to [name]. Marina Goodell, MD, IN 5 YEARS,    This report was created from the original endoscopy report, which was reviewed and signed by the above listed endoscopist.

## 2010-12-17 NOTE — Letter (Signed)
Summary: OV & labs/[facility]  OV & labs/[facility]   Imported By: Sherian Rein 11/26/2010 09:41:36  _____________________________________________________________________  External Attachment:    Type:   Image     Comment:   External Document

## 2010-12-17 NOTE — Miscellaneous (Signed)
Summary: In[employer] risk [employer]   Imported By: Sherian Rein 10/07/2010 14:27:30  _____________________________________________________________________  External Attachment:    Type:   Image     Comment:   External Document

## 2010-12-17 NOTE — Progress Notes (Signed)
Summary: form / still sick  Phone Note Call from Patient Call back at Home Phone [phone]   Caller: Patient Call For: YOUNG Summary of Call: pt wants to know status of the form that she dropped off w/ katie (per pt) 2 wks ago. form is re: a trip that pt couldn't go on (due to illnes). pt is trying to re-coup money from this. also wants dr young to know that she is still coughing/ hoarsness/ sinus drainage. she isn't taking musinex. says she does have a zpac that dr young gave her (for the cancelled trip). should she take that? also when can she get in for allergy testing?  Initial call taken by: Tivis Ringer, CNA,  September 18, 2010 2:27 PM  Follow-up for Phone Call        Spoke with Florentina Addison and she staets she placed forms in mail on Wed and sent a copy to be scanned. Pt advised of this. Also pt still c/o of having cough, hoarseness, and sinus drainage. Per phone note from 09-04-10 pt needs to set a one month ov to discuss symptoms because nothign has beenhelping pt, so pt set to see CY on 09-24-10. Carron Curie CMA  September 18, 2010 4:38 PM

## 2010-12-17 NOTE — Progress Notes (Signed)
Summary: appt  for sick visit ----scheduled to see CY  Phone Note Call from Patient Call back at Home Phone [phone]   Caller: Patient Call For: young Summary of Call: Pt suspects she has a sinus infection, wants to be seen by CY today. Initial call taken by: Darletta Moll,  August 24, 2010 9:01 AM  Follow-up for Phone Call        called and spoke with pt.  pt states she has a "sinus infection."  Pt states Sx started Friday 08/21/2010.  Pt c/o headache behind her eyes, facial pressure, sneezing, and nasal congestion.  Pt denied fever.  Pt states she is taking allegra and zinc with no relief of symptoms.  Pt states she is supposed to be going on a [place] cruise soon.  Pt would like to be seen today.  No appts avail.  I offered her an appt with CY tomorrow but pt wanted today instead.  Please advise.  Thank you.  Aundra Millet Reynolds LPN  August 24, 2010 9:29 AM allergies: PCN, Morphine, Truman Hayward  *********************    SICK   **************************  Additional Follow-up for Phone Call Additional follow up Details #1::        CY had a cancellation on his scheduled for today at 3:30pm.  Called and spoke with pt. pt scheduled to see CY today at 3:30pm. . Arman Filter LPN  August 24, 2010 11:24 AM

## 2010-12-17 NOTE — Assessment & Plan Note (Signed)
Summary: still sick//jrc   Primary Provider/Referring Provider:  Joselyn Arrow  CC:  Acute visit-staying hoarse, runny nose occasionally-clear, bad headaches, R ear "crackles" and decreased hearing, and fatigued feelings..  History of Present Illness: August 24, 2010- COPD, Allergic Rhinitis, Rhinosiusitis, CAD/ Aortic Stenosis cc: Acute visit-? sinus infection-[place] cruise in 2 days. 4 days ago had onset of sneezing, then head congestion and rhinorhea without being outdoors much. Rapid onset. Then retroorbital headache. Using Neti pot. Twinges of pain in ears. Throat a little sore. No fever. Chest is clear. She is holding a script for a Zpak to take on her trip . She had loose stools after clarithromycin for sinusitis called in in June, so we want to avoid any discomforts on her trip.  August 31, 2010-COPD, Allergic Rhinitis, Rhinosiusitis, CAD/ Aortic Stenosis Nurse CC: Acute visit-still sick-cough,sore throat, drainage, and facial pressure. Not improving quickluy and stressed about impending plane trip. Afraid she won't be able to keep up with travelling companion (who smokes). Yellow discharge, scratchy throat.   September 01, 2010- COPD, Allergic Rhinitis, Rhinosiusitis, CAD/ Aortic Stenosis Nurse CC: Acute visit-no better and has cruise to go on Thursday; wants depo and neb tx. Still coughing, sore throat, sinus pressure, sense of drainage, scant yelloow, maybe 1 degree temp elevation. Chest not tight, no nausea. Now taking Avelox, but not the prednisone.   September 24, 2010- COPD, Allergic Rhinitis, Rhinosiusitis, CAD/ Aortic Stenosis Nurse-CC: Acute visit-staying hoarse,runny nose occasionally-clear,bad headaches R ear "crackles" and decreased hearing, fatigued feelings. She had cancelled trip to [place] due to persistent rhinitis/  bronchitis syndrome, and says she still isn't well. Now c/o intermittent pounding/ pressure frontal headache, transient blurry vison w/o aura, nausea. Relieved  by tylenol. Right ear crackles and pops. Known hearing impairment. Chest is fine. Claritin was no help with some clear nasal drainage.      Preventive Screening-Counseling & Management  Alcohol-Tobacco     Smoking Status: never  Current Medications (verified): 1)  Metoprolol Tartrate 25 Mg Tabs (Metoprolol Tartrate) .... Take 1 By Mouth Two Times A Day 2)  Crestor 10 Mg  Tabs (Rosuvastatin Calcium) .... Take 1 Tablet By Mouth Once A Day 3)  Adult Aspirin Low Strength 81 Mg  Tbdp (Aspirin) .Marland Kitchen.. 1 Daily 4)  Zolpidem Tartrate 10 Mg  Tabs (Zolpidem Tartrate) .... Take 1/2 Tab By Mouth At Bedtime 5)  Caltrate 600 1500 Mg  Tabs (Calcium Carbonate) .... As Directed 6)  Senna-Plus 8.6-50 Mg  Tabs (Sennosides-Docusate Sodium) .... Take 1 Tablet By Mouth Once A Day 7)  Multivitamins   Tabs (Multiple Vitamin) .... Take 1 Tablet By Mouth Once A Day 8)  Fosamax 70 Mg Tabs (Alendronate Sodium) .... Once A Week 9)  Coq10 50 Mg/2.5gm Emul (Coenzyme Q10) .... Once A Day 10)  Levothyroxine Sodium 75 Mcg Tabs (Levothyroxine Sodium) .... Take 1 Tablet By Mouth Once A Day 11)  Allergy Vaccine 1:10 Gh .... Restart At 0.1 and Build, After Lapse. 12)  Mobic 15 Mg Tabs (Meloxicam) .... 1/4 of The Tablet Daily 13)  Pantoprazole Sodium 40 Mg Tbec (Pantoprazole Sodium) .... Take 1 Tablet By Mouth Once A Day 14)  Tussionex Pennkinetic Er 10-8 Mg/12ml Lqcr (Hydrocod Polst-Chlorphen Polst) .Marland Kitchen.. 1 Teaspoon Two Times A Day As Needed Cough  Allergies (verified): 1)  ! Penicillin 2)  ! Morphine 3)  ! * Omnicep 4)  ! * Clarithromycin  Past History:  Past Medical History: Last updated: 04/23/2010 DIVERTICULITIS OF COLON (ICD-562.11) SORE THROAT (ICD-462) DYSPEPSIA (  ICD-536.8) CONSTIPATION (ICD-564.00) DIVERTICULOSIS (ICD-562.10) GERD (ICD-530.81) BREAST CANCER (ICD-174.9) EROSIVE GASTRITIS (ICD-535.40) AORTIC STENOSIS (ICD-424.1), mild-moderate DVT (ICD-453.40) Hx of BRONCHITIS NOS (ICD-490) CORONARY ARTERY  DISEASE (ICD-414.00) s/p multivessel CABG 2007 ASTHMA (ICD-493.90) ALLERGIC RHINITIS (ICD-477.9) RHINOSINUSITIS, RECURRENT (ICD-473.9)  Past Surgical History: Last updated: 04/23/2010 Breast-Mastectomy bilateral with reconstruction CABG 5 vessel Appendectomy right ankle arthroscopy Hysterectomy Tonsillectomy  Family History: Last updated: 03/27/2008 Family History of Colon Cancer:her sister Mother with breast cancer and lung disease. Father with emphysema. Siblings with coronary artery disease.  Social History: Last updated: 03/27/2008 Patient never smoked. 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  Risk Factors: Smoking Status: never (09/24/2010)  Review of Systems      See HPI       The patient complains of shortness of breath with activity, headaches, nasal congestion/difficulty breathing through nose, and sneezing.  The patient denies shortness of breath at rest, productive cough, non-productive cough, coughing up blood, chest pain, irregular heartbeats, acid heartburn, indigestion, loss of appetite, weight change, abdominal pain, difficulty swallowing, sore throat, tooth/dental problems, itching, ear ache, hand/feet swelling, rash, change in color of mucus, and fever.    Vital Signs:  Patient profile:   69 year old female Height:      65.5 inches Weight:      144.50 pounds BMI:     23.77 O2 Sat:      99  % on Room air Pulse rate:   71 / minute BP sitting:   128 / 70  (left arm) Cuff size:   regular  Vitals Entered By: Reynaldo Minium CMA (September 24, 2010 11:26 AM)  O2 Flow:  Room air CC: Acute visit-staying hoarse,runny nose occasionally-clear,bad headaches,R ear "crackles" and decreased hearing, fatigued feelings.   Physical Exam  Additional Exam:  General: A/Ox3; pleasant and cooperative, NAD, SKIN: no rash, lesions  NODES: no lymphadenopathy HEENT: /AT, EOM- WNL, Conjuctivae- clear, PERRLA, TM-large crust  of wax right canal, nonobstructing. TM looks a little dull./ retracted on right. , Nose- minor crusiting, Throat- normal without drainage or exudate,  Mallampati  II NECK: Supple w/ fair ROM, JVD- none, normal carotid impulses w/o bruits Thyroid- normal to palpation CHEST: Clear to P&A,  HEART: RRR, 2/6 SEM- AS ABDOMEN: Soft and nl;  EXB:MWUX, nl pulses, no edema  NEURO: Grossly intact to observation      Impression & Recommendations:  Problem # 1:  RHINOSINUSITIS, ACUTE (ICD-461.8)  Less intense complaints, partly because she is no longer stressed about making trip to [place]. Still complaining c/w rhinosinusitis and eustachian dysfunction. I expect her right canal to clear itself. We will get limited CT sinus.  Her updated medication list for this problem includes:    Tussionex Pennkinetic Er 10-8 Mg/60ml Lqcr (Hydrocod polst-chlorphen polst) .Marland Kitchen... 1 teaspoon two times a day as needed cough  Orders: Radiology Referral (Radiology)  Problem # 2:  GERD (ICD-530.81) Significant problem at times in the past, but I can't tell that reflus is the primary cause of upper airway discomfort recently. Her updated medication list for this problem includes:    Pantoprazole Sodium 40 Mg Tbec (Pantoprazole sodium) .Marland Kitchen... Take 1 tablet by mouth once a day  Other Orders: Est. Patient Level III (32440) Primary Care Referral (Primary)  Patient Instructions: 1)  Please schedule a follow-up appointment in 1 month. We will retest for allergy on return 2)  A Limited Sinus CT has been recommended.  Your imaging study may require preauthorization.  3)  See [facility]  about referral to establish with [place] [facility]

## 2010-12-17 NOTE — Progress Notes (Signed)
Summary: question re a trip  Phone Note Call from Patient   Caller: Patient [phone] Reason for Call: Talk to Nurse Summary of Call: pt calling for lauren re asking dr cooper if she can go on a bus trip in [place] above [place] -pls call Initial call taken by: Glynda Jaeger,  August 05, 2010 2:49 PM  Follow-up for Phone Call        The pt is going on vacation in 1 month and would like to get Dr Earmon Phoenix thoughts about higher elevation.  Will discuss with MD next week when back in the office.  Julieta Gutting, RN, BSN  August 05, 2010 2:58 PM  Additional Follow-up for Phone Call Additional follow up Details #1::        this should be ok as long as not much physical activity required at high altitude. Additional Follow-up by: Norva Karvonen, MD,  August 10, 2010 11:02 AM    Additional Follow-up for Phone Call Additional follow up Details #2::    I spoke with the pt and made her aware that she could take a bus trip while on vacation. The bus trip will last about 4 hours.  The pt also asked about taking a helicopter to see the volcanoes.  This would be a short trip also.  I told the pt that at higher elevations she may experience SOB and length of time at these elevations should be limited. The pt was also instructed not to physically over exert herself at high altitude.  Pt agreed.  Follow-up by: Julieta Gutting, RN, BSN,  August 11, 2010 10:33 AM

## 2010-12-17 NOTE — Assessment & Plan Note (Signed)
Summary: still sick-pt to get neo neb and depo 80/kcw   Primary Prue Lingenfelter/Referring Tiarna Koppen:  Joselyn Arrow  CC:  Acute visit-no better and has cruise to go on Thursday; wants depo and neb tx..  History of Present Illness:  August 24, 2010- COPD, Allergic Rhinitis, Rhinosiusitis, CAD/ Aortic Stenosis cc: Acute visit-? sinus infection-[place] cruise in 2 days. 4 days ago had onset of sneezing, then head congestion and rhinorhea without being outdoors much. Rapid onset. Then retroorbital headache. Using Neti pot. Twinges of pain in ears. Throat a little sore. No fever. Chest is clear. She is holding a script for a Zpak to take on her trip . She had loose stools after clarithromycin for sinusitis called in in June, so we want to avoid any discomforts on her trip.  August 31, 2010-COPD, Allergic Rhinitis, Rhinosiusitis, CAD/ Aortic Stenosis Nurse CC: Acute visit-still sick-cough,sore throat, drainage, and facial pressure. Not improving quickluy and stressed about impending plane trip. Afraid she won't be able to keep up with travelling companion (who smokes). Yellow discharge, scratchy throat.   September 01, 2010- COPD, Allergic Rhinitis, Rhinosiusitis, CAD/ Aortic Stenosis Nurse CC: Acute visit-no better and has cruise to go on Thursday; wants depo and neb tx. Still coughing, sore throat, sinus pressure, sense of drainage, scant yelloow, maybe 1 degree temp elevation. Chest not tight, no nausea. Now taking Avelox, but not the prednisone.    Preventive Screening-Counseling & Management  Alcohol-Tobacco     Smoking Status: never  Current Medications (verified): 1)  Metoprolol Tartrate 25 Mg Tabs (Metoprolol Tartrate) .... Take 1 By Mouth Two Times A Day 2)  Crestor 10 Mg  Tabs (Rosuvastatin Calcium) .... Take 1 Tablet By Mouth Once A Day 3)  Adult Aspirin Low Strength 81 Mg  Tbdp (Aspirin) .Marland Kitchen.. 1 Daily 4)  Zolpidem Tartrate 10 Mg  Tabs (Zolpidem Tartrate) .... Take 1/2 Tab By Mouth At  Bedtime 5)  Caltrate 600 1500 Mg  Tabs (Calcium Carbonate) .... As Directed 6)  Senna-Plus 8.6-50 Mg  Tabs (Sennosides-Docusate Sodium) .... Take 1 Tablet By Mouth Once A Day 7)  Multivitamins   Tabs (Multiple Vitamin) .... Take 1 Tablet By Mouth Once A Day 8)  Fosamax 70 Mg Tabs (Alendronate Sodium) .... Once A Week 9)  Coq10 50 Mg/2.5gm Emul (Coenzyme Q10) .... Once A Day 10)  Levothyroxine Sodium 75 Mcg Tabs (Levothyroxine Sodium) .... Take 1 Tablet By Mouth Once A Day 11)  Allergy Vaccine 1:10 Gh .... Restart At 0.1 and Build, After Lapse. 12)  Mobic 15 Mg Tabs (Meloxicam) .... 1/4 of The Tablet Daily 13)  Pantoprazole Sodium 40 Mg Tbec (Pantoprazole Sodium) .... Take 1 Tablet By Mouth Once A Day 14)  Prednisone 10 Mg Tabs (Prednisone) .Marland Kitchen.. 1 Tab Four Times Daily X 2 Days, 3 Times Daily X 2 Days, 2 Times Daily X 2 Days, 1 Time Daily X 2 Days  Allergies (verified): 1)  ! Penicillin 2)  ! Morphine 3)  ! * Omnicep 4)  ! * Clarithromycin  Past History:  Past Medical History: Last updated: 04/23/2010 DIVERTICULITIS OF COLON (ICD-562.11) SORE THROAT (ICD-462) DYSPEPSIA (ICD-536.8) CONSTIPATION (ICD-564.00) DIVERTICULOSIS (ICD-562.10) GERD (ICD-530.81) BREAST CANCER (ICD-174.9) EROSIVE GASTRITIS (ICD-535.40) AORTIC STENOSIS (ICD-424.1), mild-moderate DVT (ICD-453.40) Hx of BRONCHITIS NOS (ICD-490) CORONARY ARTERY DISEASE (ICD-414.00) s/p multivessel CABG 2007 ASTHMA (ICD-493.90) ALLERGIC RHINITIS (ICD-477.9) RHINOSINUSITIS, RECURRENT (ICD-473.9)  Past Surgical History: Last updated: 04/23/2010 Breast-Mastectomy bilateral with reconstruction CABG 5 vessel Appendectomy right ankle arthroscopy Hysterectomy Tonsillectomy  Family History: Last updated:  03/27/2008 Family History of Colon Cancer:her sister Mother with breast cancer and lung disease. Father with emphysema. Siblings with coronary artery disease.  Social History: Last updated: 03/27/2008 Patient never smoked.  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  Risk Factors: Smoking Status: never (09/01/2010)  Review of Systems      See HPI       The patient complains of shortness of breath with activity, non-productive cough, sore throat, headaches, nasal congestion/difficulty breathing through nose, anxiety, and change in color of mucus.  The patient denies shortness of breath at rest, productive cough, coughing up blood, chest pain, irregular heartbeats, acid heartburn, indigestion, loss of appetite, weight change, abdominal pain, difficulty swallowing, and tooth/dental problems.    Vital Signs:  Patient profile:   69 year old female Height:      65.5 inches Weight:      146 pounds BMI:     24.01 O2 Sat:      98 % on Room air Temp:     97 .5 degrees F oral Pulse rate:   70 / minute BP sitting:   132 / 72  (right arm) Cuff size:   regular  Vitals Entered By: Reynaldo Minium CMA (September 01, 2010 4:45 PM)  O2 Flow:  Room air CC: Acute visit-no better and has cruise to go on Thursday; wants depo and neb tx.   Physical Exam  Additional Exam:  General: A/Ox3; pleasant and cooperative, NAD, SKIN: no rash, lesions  NODES: no lymphadenopathy HEENT: [place]/AT, EOM- WNL, Conjuctivae- clear, PERRLA, TM-look clear, Nose- sniffing, Throat-, less red and glandular without drainage or exudate,  Mallampati  II NECK: Supple w/ fair ROM, JVD- none, normal carotid impulses w/o bruits Thyroid- normal to palpation CHEST: Clear to P&A,  HEART: RRR, 2/6 SEM- AS ABDOMEN: Soft and nl;  EAV:WUJW, nl pulses, no edema  NEURO: Grossly intact to observation      Impression & Recommendations:  Problem # 1:  SORE THROAT (ICD-462)  rhinitis - possibly viral URI vs clearing rhinosinusitis. she is still substantially anxious about her trip. we discussed options and she has Zpak and prednisone to carry. Will add script for tussionex at her request. She is still on Avelox.  Discussed Neti pot, gargling, mucinex.She came today wanting neb and depo, still more worried about the trip. If she were staying her I don't think she would be nearly as uncomfortable about her status.  Her updated medication list for this problem includes:    Adult Aspirin Low Strength 81 Mg Tbdp (Aspirin) .Marland Kitchen... 1 daily    Mobic 15 Mg Tabs (Meloxicam) .Marland Kitchen... 1/4 of the tablet daily  Problem # 2:  ASTHMA (ICD-493.90) A little cough, but without wheeze or rhonhi. i think she is having a little throat irritation to cause cough, but not an active bronchitis yet.   Medications Added to Medication List This Visit: 1)  Tussionex Pennkinetic Er 10-8 Mg/26ml Lqcr (Hydrocod polst-chlorphen polst) .Marland Kitchen.. 1 teaspoon two times a day as needed cough  Other Orders: Est. Patient Level III (11914) Admin of Therapeutic Inj  intramuscular or subcutaneous (78295) Depo- Medrol 80mg  (J1040) Nebulizer Tx (62130)  Patient Instructions: 1)  Return as scheduled 2)  Neb neo nasal 3)  depo 80 4)  script for tussionex- put it in small travel bottle for plane 5)  consider "airplane" ear plugs  Prescriptions: TUSSIONEX PENNKINETIC ER 10-8 MG/5ML LQCR (HYDROCOD POLST-CHLORPHEN POLST) 1 teaspoon two times a day as needed cough  #  150 ml x 0   Entered and Authorized by:   Waymon Budge MD   Signed by:   Waymon Budge MD on 09/01/2010   Method used:   Print then Give to Patient   RxID:   [id number]      Medication Administration  Injection # 1:    Medication: Depo- Medrol 80mg     Diagnosis: SORE THROAT (ICD-462)    Route: SQ    Site: RUOQ gluteus    Exp Date: 02/2013    Lot #: [id number]    Mfr: Pharmacia    Patient tolerated injection without complications    Given by: Reynaldo Minium CMA (September 01, 2010 5:32 PM)  Medication # 1:    Medication: EMR miscellaneous medications    Diagnosis: SORE THROAT (ICD-462)    Dose: 3 drops    Route: intranasal    Exp Date: 05/2011    Lot #: [id number]    Mfr:  Bayer    Comments: Neo-Synephrine    Patient tolerated medication without complications    Given by: Reynaldo Minium CMA (September 01, 2010 5:32 PM)  Orders Added: 1)  Est. Patient Level III [62130] 2)  Admin of Therapeutic Inj  intramuscular or subcutaneous [96372] 3)  Depo- Medrol 80mg  [J1040] 4)  Nebulizer Tx [86578]

## 2010-12-17 NOTE — Miscellaneous (Signed)
Summary: d/c allergy vaccine/[facility]  d/c allergy vaccine/[facility]   Imported By: Sherian Rein 03/24/2010 07:35:10  _____________________________________________________________________  External Attachment:    Type:   Image     Comment:   External Document

## 2010-12-17 NOTE — Assessment & Plan Note (Signed)
Summary: still sick/sore throat/cough/mg   Primary Provider/Referring Provider:  Joselyn Arrow  CC:  Acute visit-still sick-cough, sore throat, drainage, and and facial pressure.Marland Kitchen  History of Present Illness: History of Present Illness: Mar 19, 2010- COPD,  Not using oxygen in a long time.  She has continued allergy vaccine, noting that this Spring has been good,. She had been on allergy vacicine this time for several years. She is not using respiratory sprays in peak season . Asks check for cerumen, hoping she just has wax so she doesn't need her hearing aids.  August 24, 2010- COPD, Allergic Rhinitis, Rhinosiusitis, CAD/ Aortic Stenosis cc: Acute visit-? sinus infection-[place] cruise in 2 days. 4 days ago had onset of sneezing, then head congestion and rhinorhea without being outdoors much. Rapid onset. Then retroorbital headache. Using Neti pot. Twinges of pain in ears. Throat a little sore. No fever. Chest is clear. She is holding a script for a Zpak to take on her trip . She had loose stools after clarithromycin for sinusitis called in in June, so we want to avoid any discomforts on her trip.  August 31, 2010-COPD, Allergic Rhinitis, Rhinosiusitis, CAD/ Aortic Stenosis Nurse CC: Acute visit-still sick-cough,sore throat, drainage, and facial pressure. Not improving quickluy and stressed about impending plane trip. Afraid she won't be able to keep up with travelling companion (who smokes). Yellow discharge, scratchy throat.     Preventive Screening-Counseling & Management  Alcohol-Tobacco     Smoking Status: never  Current Medications (verified): 1)  Metoprolol Tartrate 25 Mg Tabs (Metoprolol Tartrate) .... Take 1 By Mouth Two Times A Day 2)  Crestor 10 Mg  Tabs (Rosuvastatin Calcium) .... Take 1 Tablet By Mouth Once A Day 3)  Adult Aspirin Low Strength 81 Mg  Tbdp (Aspirin) .Marland Kitchen.. 1 Daily 4)  Zolpidem Tartrate 10 Mg  Tabs (Zolpidem Tartrate) .... Take 1/2 Tab By Mouth At Bedtime 5)   Caltrate 600 1500 Mg  Tabs (Calcium Carbonate) .... As Directed 6)  Senna-Plus 8.6-50 Mg  Tabs (Sennosides-Docusate Sodium) .... Take 1 Tablet By Mouth Once A Day 7)  Multivitamins   Tabs (Multiple Vitamin) .... Take 1 Tablet By Mouth Once A Day 8)  Fosamax 70 Mg Tabs (Alendronate Sodium) .... Once A Week 9)  Coq10 50 Mg/2.5gm Emul (Coenzyme Q10) .... Once A Day 10)  Levothyroxine Sodium 75 Mcg Tabs (Levothyroxine Sodium) .... Take 1 Tablet By Mouth Once A Day 11)  Allergy Vaccine 1:10 Gh .... Restart At 0.1 and Build, After Lapse. 12)  Mobic 15 Mg Tabs (Meloxicam) .... 1/4 of The Tablet Daily 13)  Pantoprazole Sodium 40 Mg Tbec (Pantoprazole Sodium) .... Take 1 Tablet By Mouth Once A Day 14)  Prednisone 10 Mg Tabs (Prednisone) .Marland Kitchen.. 1 Tab Four Times Daily X 2 Days, 3 Times Daily X 2 Days, 2 Times Daily X 2 Days, 1 Time Daily X 2 Days  Allergies (verified): 1)  ! Penicillin 2)  ! Morphine 3)  ! * Omnicep 4)  ! * Clarithromycin  Past History:  Past Medical History: Last updated: 04/23/2010 DIVERTICULITIS OF COLON (ICD-562.11) SORE THROAT (ICD-462) DYSPEPSIA (ICD-536.8) CONSTIPATION (ICD-564.00) DIVERTICULOSIS (ICD-562.10) GERD (ICD-530.81) BREAST CANCER (ICD-174.9) EROSIVE GASTRITIS (ICD-535.40) AORTIC STENOSIS (ICD-424.1), mild-moderate DVT (ICD-453.40) Hx of BRONCHITIS NOS (ICD-490) CORONARY ARTERY DISEASE (ICD-414.00) s/p multivessel CABG 2007 ASTHMA (ICD-493.90) ALLERGIC RHINITIS (ICD-477.9) RHINOSINUSITIS, RECURRENT (ICD-473.9)  Past Surgical History: Last updated: 04/23/2010 Breast-Mastectomy bilateral with reconstruction CABG 5 vessel Appendectomy right ankle arthroscopy Hysterectomy Tonsillectomy  Family History: Last updated:  03/27/2008 Family History of Colon Cancer:her sister Mother with breast cancer and lung disease. Father with emphysema. Siblings with coronary artery disease.  Social History: Last updated: 03/27/2008 Patient never smoked. 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  Risk Factors: Smoking Status: never (08/31/2010)  Review of Systems      See HPI       The patient complains of shortness of breath with activity, non-productive cough, headaches, nasal congestion/difficulty breathing through nose, anxiety, change in color of mucus, and fever.  The patient denies shortness of breath at rest, productive cough, coughing up blood, chest pain, irregular heartbeats, acid heartburn, indigestion, loss of appetite, weight change, abdominal pain, difficulty swallowing, sore throat, tooth/dental problems, sneezing, and rash.    Vital Signs:  Patient profile:   69 year old female Height:      65.5 inches Weight:      143.50 pounds BMI:     23.60 O2 Sat:      10 0 % on Room air Pulse rate:   52 / minute BP sitting:   140 / 60  (right arm) Cuff size:   regular  Vitals Entered By: Reynaldo Minium CMA (August 31, 2010 11:19 AM)  O2 Flow:  Room air CC: Acute visit-still sick-cough,sore throat, drainage, and facial pressure.   Physical Exam  Additional Exam:  General: A/Ox3; pleasant and cooperative, NAD, SKIN: no rash, lesions ................looks pale to me NODES: no lymphadenopathy HEENT: [place]/AT, EOM- WNL, Conjuctivae- clear, PERRLA, TM-sleft TM a little retracted Right, Nose- sniffing, Throat-, red and glandular without drainage or exudate,  Mallampati  II. I don't see postnasal drip or purulent nasal discharge NECK: Supple w/ fair ROM, JVD- none, normal carotid impulses w/o bruits Thyroid- normal to palpation CHEST: Clear to P&A, diminished HEART: RRR, 2/6 SEM- AS ABDOMEN: Soft and nl;  BJY:NWGN, nl pulses, no edema  NEURO: Grossly intact to observation      Impression & Recommendations:  Problem # 1:  RHINOSINUSITIS, ACUTE (ICD-461.8) We will give Avelox now. She can take a little peptobismol if needed.  The following medications were removed from the medication  list:    Zithromax Z-pak 250 Mg Tabs (Azithromycin) .Marland Kitchen... 2 today then one daily Her updated medication list for this problem includes:    Tussionex Pennkinetic Er 10-8 Mg/74ml Lqcr (Hydrocod polst-chlorphen polst) .Marland Kitchen... 1 teaspoon two times a day as needed cough  Problem # 2:  ASTHMA (ICD-493.90) There is hx of asthmatic bronhcitis, but most of her discomfort is upper airway now.   Looks pale to me in this lighting. Will check CBC for ? anemia contributing to malaise?  Other Orders: Est. Patient Level III (56213) TLB-CBC Platelet - w/Differential (85025-CBCD)  Patient Instructions: 1)  Keep scheduled appointment or on return from your trip as needed.  2)  Lab 3)  Samples if available for Avelox 400 mg, 1 daily x 5 days 4)  sample Astepro nasal antihistamine spray, 1-2 piuffs each nostril up to twice daily if needed.

## 2010-12-17 NOTE — Assessment & Plan Note (Signed)
Summary: f60m   Visit Type:  6 months follow up Primary Provider:  Joselyn Arrow  CC:  no complaints.  History of Present Illness: 70 year-old woman with CAD s/p CABG in 2007 after she presented with NSTEMI. She presents today for follow-up evaluation.   Ms Cirrincione is doing well and she denies chest pain at rest or with exertion. No dyspnea, edema, palps, or other complaints at present.  She walks 2 miles per day for exercise.  Current Medications (verified): 1)  Metoprolol Tartrate 25 Mg Tabs (Metoprolol Tartrate) .... Take 1 By Mouth Two Times A Day 2)  Crestor 10 Mg  Tabs (Rosuvastatin Calcium) .... Take 1 Tablet By Mouth Once A Day 3)  Adult Aspirin Low Strength 81 Mg  Tbdp (Aspirin) .Marland Kitchen.. 1 Daily 4)  Zolpidem Tartrate 10 Mg  Tabs (Zolpidem Tartrate) .... Take 1/2 Tab By Mouth At Bedtime 5)  Caltrate 600 1500 Mg  Tabs (Calcium Carbonate) .... As Directed 6)  Senna-Plus 8.6-50 Mg  Tabs (Sennosides-Docusate Sodium) .... Take 1 Tablet By Mouth Once A Day 7)  Multivitamins   Tabs (Multiple Vitamin) .... Take 1 Tablet By Mouth Once A Day 8)  Fosamax 70 Mg Tabs (Alendronate Sodium) .... Once A Week 9)  Coq10 50 Mg/2.5gm Emul (Coenzyme Q10) .... Once A Day 10)  Levothyroxine Sodium 75 Mcg Tabs (Levothyroxine Sodium) .... Take 1 Tablet By Mouth Once A Day 11)  Allergy Vaccine 1:10 Gh .... Restart At 0.1 and Build, After Lapse. 12)  Mobic 15 Mg Tabs (Meloxicam) .... 1/4 of The Tablet Daily 13)  Pantoprazole Sodium 40 Mg Tbec (Pantoprazole Sodium) .... Take 1 Tablet By Mouth Once A Day  Allergies: 1)  ! Penicillin 2)  ! Morphine 3)  ! * Omnicep  Past History:  Past medical history reviewed for relevance to current acute and chronic problems.  Past Medical History: Reviewed history from 04/23/2010 and no changes required. DIVERTICULITIS OF COLON (ICD-562.11) SORE THROAT (ICD-462) DYSPEPSIA (ICD-536.8) CONSTIPATION (ICD-564.00) DIVERTICULOSIS (ICD-562.10) GERD (ICD-530.81) BREAST CANCER  (ICD-174.9) EROSIVE GASTRITIS (ICD-535.40) AORTIC STENOSIS (ICD-424.1), mild-moderate DVT (ICD-453.40) Hx of BRONCHITIS NOS (ICD-490) CORONARY ARTERY DISEASE (ICD-414.00) s/p multivessel CABG 2007 ASTHMA (ICD-493.90) ALLERGIC RHINITIS (ICD-477.9) RHINOSINUSITIS, RECURRENT (ICD-473.9)  Review of Systems       Negative except as per HPI   Vital Signs:  Patient profile:   70 year old female Height:      65.5 inches Weight:      143.50 pounds BMI:     23.60 Pulse rate:   64 / minute Pulse rhythm:   regular Resp:     18 per minute BP sitting:   105 / 69  (right arm) Cuff size:   large  Vitals Entered By: Vikki Ports (June 25, 2010 2:05 PM)  Physical Exam  General:  Pt is alert and oriented, in no acute distress. HEENT: normal Neck: normal carotid upstrokes without bruits, JVP normal Lungs: CTA CV: RRR with 3/6 harsh systolic murmur RUSB - preserved A2 Abd: soft, NT, positive BS, no bruit, no organomegaly Ext: no clubbing, cyanosis, or edema. peripheral pulses 2+ and equal Skin: warm and dry without rash    EKG  Procedure date:  06/25/2010  Findings:      NSR 64 bpm, within normal limits  Impression & Recommendations:  Problem # 1:  CORONARY ARTERY DISEASE (ICD-414.00) Stable without angina on ASA and a beta blocker as below. She had diffuse distal and small vessel CAD at initial diagnosis. Will check an  exercise treadmill test in 6 months at the time of her followup.  Her updated medication list for this problem includes:    Metoprolol Tartrate 25 Mg Tabs (Metoprolol tartrate) .Marland Kitchen... Take 1 by mouth two times a day    Adult Aspirin Low Strength 81 Mg Tbdp (Aspirin) .Marland Kitchen... 1 daily  Orders: EKG w/ Interpretation (93000)  Problem # 2:  AORTIC STENOSIS (ICD-424.1) Exam findings stable, pt asymptomatic, continue observation.  Her updated medication list for this problem includes:    Metoprolol Tartrate 25 Mg Tabs (Metoprolol tartrate) .Marland Kitchen... Take 1 by mouth two  times a day  Problem # 3:  PURE HYPERCHOLESTEROLEMIA (ICD-272.0) Lipids at goal as below. LFT's have been ok. Her updated medication list for this problem includes:    Crestor 10 Mg Tabs (Rosuvastatin calcium) .Marland Kitchen... Take 1 tablet by mouth once a day  CHOL: 139 (12/16/2009)   LDL: 74 (12/16/2009)   HDL: 55.30 (12/16/2009)   TG: 49.0 (12/16/2009)  Patient Instructions: 1)  Your physician has requested that you have an exercise tolerance test in 6 MONTHS.  For further information please visit [email or web address]  Please also follow instruction sheet, as given. 2)  Your physician recommends that you continue on your current medications as directed. Please refer to the Current Medication list given to you today. 3)  Your physician recommends that you return for a FASTING LIPID and LIVER Profile in 6 MONTHS (414.02, 272.0)

## 2010-12-17 NOTE — Assessment & Plan Note (Signed)
Summary: rov/apc   Primary Provider/Referring Provider:  Joselyn Arrow  CC:  follow up visit-allergy vaccine needed.Marland Kitchen  History of Present Illness:  03/27/08- 70 year old woman with known history of  COPD with chronic hypoxic respiratory failure and history of hemoptysis.   October 15, 2008 --Complains of productive cough with green mucus, increased dyspena over last 2 weeks. Has had several flares over last 4 months, tx with antibiotics and steroids x 2. Last tx 1 month ago. Denies chest pain, dyspnea, orthopnea, hemoptysis, fever, n/v/d, edema. Has been having heartburn using gas-x/pepcid. Appetite has decreased. Has been having low back pain, seen by primary xray neg for fx. using pain meds and lidoderm patch.   10/29/08- Called Dec 11 for sinus infection. We called z pak, pred,then pred taper all done. Cough and nose- clear or yellow. Chest feels full.Occ small spot of green. Began with frontal headache and nasal congestion. No fever.Dr [name] asked cardiac sparing Coricidin or Mucinex.Cares for Kolbey Teichert grandchildre frequently sick. "Weird feeling" in the head. She is concerned about ability to make holiday airflight to family.  Mar 19, 2010- COPD,  Not using oxygen in a long time.  She has continued allergy vaccine, noting that this Spring has been good,. She had been on allergy vacicine this time for several years. She is not using respiratory sprays in peak season . Asks check for cerumen, hoping she just has wax so she doesn't need her hearing aids.  Current Medications (verified): 1)  Metoprolol Tartrate 25 Mg Tabs (Metoprolol Tartrate) .... Take 1 By Mouth Two Times A Day 2)  Crestor 10 Mg  Tabs (Rosuvastatin Calcium) .... Take 1 Tablet By Mouth Once A Day 3)  Adult Aspirin Low Strength 81 Mg  Tbdp (Aspirin) .Marland Kitchen.. 1 Daily 4)  Zolpidem Tartrate 10 Mg  Tabs (Zolpidem Tartrate) .... Take 1/2 Tab By Mouth At Bedtime 5)  Caltrate 600 1500 Mg  Tabs (Calcium Carbonate) .... As Directed 6)   Senna-Plus 8.6-50 Mg  Tabs (Sennosides-Docusate Sodium) .... Take 1 Tablet By Mouth Once A Day 7)  Multivitamins   Tabs (Multiple Vitamin) .... Take 1 Tablet By Mouth Once A Day 8)  Allergy Vaccine 1:10  Gh .... Every Week 9)  Boniva 150 Mg Tabs (Ibandronate Sodium) .... Take Once Monthly 10)  Coq10 50 Mg/2.5gm Emul (Coenzyme Q10) .... Once A Day 11)  Levothyroxine Sodium 75 Mcg Tabs (Levothyroxine Sodium) .... Take 1 Tablet By Mouth Once A Day 12)  Mobic 15 Mg Tabs (Meloxicam) .... As Needed  Allergies (verified): 1)  ! Penicillin 2)  ! Morphine 3)  ! Debbora Dus  Past History:  Past Medical History: Last updated: 12/22/2009 DIVERTICULITIS OF COLON (ICD-562.11) SORE THROAT (ICD-462) FLATULENCE-GAS-BLOATING (ICD-787.3) DYSPEPSIA (ICD-536.8) CONSTIPATION (ICD-564.00) DIVERTICULOSIS (ICD-562.10) GERD (ICD-530.81) BREAST CANCER (ICD-174.9) EROSIVE GASTRITIS (ICD-535.40) AORTIC STENOSIS (ICD-424.1), mild-moderate DVT (ICD-453.40) Hx of BRONCHITIS NOS (ICD-490) CORONARY ARTERY DISEASE (ICD-414.00) s/p multivessel CABG 2007 ASTHMA (ICD-493.90) ALLERGIC RHINITIS (ICD-477.9) RHINOSINUSITIS, RECURRENT (ICD-473.9)  Past Surgical History: Last updated: 03/27/2008 Breast-Mastectomy bilateral with reconstruction CABG 5 vessel Appendectomy right ankle arthroscopy  Family History: Last updated: 03/27/2008 Family History of Colon Cancer:her sister Mother with breast cancer and lung disease. Father with emphysema. Siblings with coronary artery disease.  Social History: Last updated: 03/27/2008 Patient never smoked. 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  Risk Factors: Smoking Status: never (03/19/2008)  Review of Systems      See HPI  The patient denies anorexia, fever, weight  loss, weight gain, vision loss, decreased hearing, hoarseness, chest pain, syncope, dyspnea on exertion, peripheral edema, prolonged cough, headaches,  hemoptysis, and severe indigestion/heartburn.    Vital Signs:  Patient profile:   70 year old female Height:      65.5 inches Weight:      145 pounds BMI:     23.85 O2 Sat:      97 % on Room air Pulse rate:   90 / minute BP sitting:   146 / 76  (right arm) Cuff size:   regular  Vitals Entered By: Reynaldo Minium CMA (Mar 19, 2010 4:03 PM)  O2 Flow:  Room air  Physical Exam  Additional Exam:  General: A/Ox3; pleasant and cooperative, NAD, SKIN: no rash, lesions clammy NODES: no lymphadenopathy HEENT: /AT, EOM- WNL, Conjuctivae- clear, PERRLA, TM-sleft TM a little retracted Right, Nose- sniffingr, Throat- Mallampati  II NECK: Supple w/ fair ROM, JVD- none, normal carotid impulses w/o bruits Thyroid- normal to palpation CHEST: Clear to P&A, d HEART: RRR, 2/6 SEM- AS ABDOMEN: Soft and nl;  HYQ:MVHQ, nl pulses, no edema  NEURO: Grossly intact to observation      Impression & Recommendations:  Problem # 1:  ALLERGIC RHINITIS (ICD-477.9)  She has been doing very well. We are going to stop vaccine and see how she does. OK to use otc antihistamine if needed  Problem # 2:  ASTHMA (ICD-493.90) No significant wheeze in a long time  Medications Added to Medication List This Visit: 1)  Boniva 150 Mg Tabs (Ibandronate sodium) .... Take once monthly  Other Orders: Est. Patient Level III (46962)  Patient Instructions: 1)  Please schedule a follow-up appointment as needed. 2)  OK to stop allergy vaccine now and see how you do. 3)  consider an otc antihistamine like loratadine or allegra if needed. 4)  Sample Astepro nasal antinhistamine spray: 1-2 puffs eachy nostril up to twice daily if needed.

## 2010-12-17 NOTE — Assessment & Plan Note (Signed)
Summary: add-on   Visit Type:  Follow-up Primary Provider:  Joselyn Arrow  CC:  Chest pains-Neck pain- Shoulders pain- Tiredness.  History of Present Illness: 70 year-old woman with CAD s/p CABG in 2007 after she presented with NSTEMI. She presents today for follow-up evaluation.   She called in today because she is 'feeling terrible.' She complains of feeling bad over the past few weeks, with some problems related to stress, but still feels poorly even though stressful events are over.  She has multiple complaints today: neck and shoulder pain, fatigue, yawning, belching, heartburn symptoms. She complains of shortness of breath with any exertion, even walking short distances. Also feels indigestion with exertion or activity with frequent belching. This was her presenting symptom prior to CABG. She is concerned as symptoms are reminiscent of her initial presentation with angina.  Current Medications (verified): 1)  Metoprolol Tartrate 25 Mg Tabs (Metoprolol Tartrate) .... Take 1 By Mouth Two Times A Day 2)  Crestor 10 Mg  Tabs (Rosuvastatin Calcium) .... Take 1 Tablet By Mouth Once A Day 3)  Adult Aspirin Low Strength 81 Mg  Tbdp (Aspirin) .Marland Kitchen.. 1 Daily 4)  Zolpidem Tartrate 10 Mg  Tabs (Zolpidem Tartrate) .... Take 1/2 Tab By Mouth At Bedtime 5)  Caltrate 600 1500 Mg  Tabs (Calcium Carbonate) .... As Directed 6)  Senna-Plus 8.6-50 Mg  Tabs (Sennosides-Docusate Sodium) .... Take 1 Tablet By Mouth Once A Day 7)  Multivitamins   Tabs (Multiple Vitamin) .... Take 1 Tablet By Mouth Once A Day 8)  Fosamax 70 Mg Tabs (Alendronate Sodium) .... Once A Week 9)  Coq10 50 Mg/2.5gm Emul (Coenzyme Q10) .... Once A Day 10)  Levothyroxine Sodium 75 Mcg Tabs (Levothyroxine Sodium) .... Take 1 Tablet By Mouth Once A Day 11)  Allergy Vaccine 1:10 Gh .... Restart At 0.1 and Build, After Lapse. 12)  Mobic 15 Mg Tabs (Meloxicam) .... 1/4 of The Tablet Daily 13)  Pantoprazole Sodium 40 Mg Tbec (Pantoprazole Sodium)  .... Take 1 Tablet By Mouth Once A Day  Allergies: 1)  ! Penicillin 2)  ! Morphine 3)  ! * Omnicep 4)  ! * Clarithromycin  Past History:  Past medical, surgical, family and social histories (including risk factors) reviewed, and no changes noted (except as noted below).  Past Medical History: Reviewed history from 04/23/2010 and no changes required. DIVERTICULITIS OF COLON (ICD-562.11) SORE THROAT (ICD-462) DYSPEPSIA (ICD-536.8) CONSTIPATION (ICD-564.00) DIVERTICULOSIS (ICD-562.10) GERD (ICD-530.81) BREAST CANCER (ICD-174.9) EROSIVE GASTRITIS (ICD-535.40) AORTIC STENOSIS (ICD-424.1), mild-moderate DVT (ICD-453.40) Hx of BRONCHITIS NOS (ICD-490) CORONARY ARTERY DISEASE (ICD-414.00) s/p multivessel CABG 2007 ASTHMA (ICD-493.90) ALLERGIC RHINITIS (ICD-477.9) RHINOSINUSITIS, RECURRENT (ICD-473.9)  Past Surgical History: Reviewed history from 04/23/2010 and no changes required. Breast-Mastectomy bilateral with reconstruction CABG 5 vessel Appendectomy right ankle arthroscopy Hysterectomy Tonsillectomy  Family History: Reviewed history from 03/27/2008 and no changes required. Family History of Colon Cancer:her sister Mother with breast cancer and lung disease. Father with emphysema. Siblings with coronary artery disease.  Social History: Reviewed history from 03/27/2008 and no changes required. Patient never smoked. 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  Review of Systems       Negative except as per HPI   Vital Signs:  Patient profile:   69 year old female Height:      65.5 inches Weight:      142.50 pounds BMI:     23 .44 Pulse rate:   64 / minute Pulse rhythm:   regular Resp:  18 per minute BP sitting:   151 / 81  (left arm) Cuff size:   large  Vitals Entered By: Vikki Ports (October 26, 2010 4:08 PM)  Physical Exam  General:  Pt is alert and oriented, in no acute distress. HEENT:  normal Neck: normal carotid upstrokes with soft bilateral bruits, JVP normal Lungs: CTA CV: RRR with 3/6 harsh systolic murmur RUSB - preserved A2 Abd: soft, NT, positive BS, no bruit, no organomegaly Ext: no clubbing, cyanosis, or edema. peripheral pulses 2+ and equal Skin: warm and dry without rash    EKG  Procedure date:  10/26/2010  Findings:      NSR 67 bpm, within normal limits.  Impression & Recommendations:  Problem # 1:  CORONARY ARTERY DISEASE (ICD-414.00) Difficult to sort out symptoms, but concerned about similarity to prior symptoms when she required multivessel CABG. Recommend cardiac cath for definitive evaluation in the setting of high pretest probability of recurrent obstructive disease. Will continue current medical regimen until her coronary/graft anatomy is delineated. Risks/benefit/alternatives to diagnostic cath plus/minus PCI were reviewed in detail with the patient who agrees to proceed.  Will check baseline labs for the cath, but also will check a CXR, Sed Rate, and TSH to rule out other processes that could cause her symptom complex.  Her updated medication list for this problem includes:    Metoprolol Tartrate 25 Mg Tabs (Metoprolol tartrate) .Marland Kitchen... Take 1 by mouth two times a day    Adult Aspirin Low Strength 81 Mg Tbdp (Aspirin) .Marland Kitchen... 1 daily  Orders: EKG w/ Interpretation (93000) T-2 View CXR (71020TC) TLB-CBC Platelet - w/Differential (85025-CBCD) TLB-BMP (Basic Metabolic Panel-BMET) (80048-METABOL) TLB-Hepatic/Liver Function Pnl (80076-HEPATIC) TLB-TSH (Thyroid Stimulating Hormone) (84443-TSH) TLB-Sedimentation Rate (ESR) (85652-ESR) TLB-PT (Protime) (85610-PTP) TLB-CK-MB (Creatine Kinase MB) (82553-CKMB) TLB-CK Total Only(Creatine Kinase/CPK) (82550-CK) Cardiac Catheterization (Cardiac Cath)  Problem # 2:  PURE HYPERCHOLESTEROLEMIA (ICD-272.0) Well-controlled.  Her updated medication list for this problem includes:    Crestor 10 Mg Tabs  (Rosuvastatin calcium) .Marland Kitchen... Take 1 tablet by mouth once a day  CHOL: 139 (12/16/2009)   LDL: 74 (12/16/2009)   HDL: 55.30 (12/16/2009)   TG: 49.0 (12/16/2009)  Patient Instructions: 1)  Your physician recommends that you schedule a follow-up appointment in: 2-3 WEEKS 2)  Your physician has requested that you have a cardiac catheterization.  Cardiac catheterization is used to diagnose and/or treat various heart conditions. Doctors may recommend this procedure for a number of different reasons. The most common reason is to evaluate chest pain. Chest pain can be a symptom of coronary artery disease (CAD), and cardiac catheterization can show whether plaque is narrowing or blocking your heart's arteries. This procedure is also used to evaluate the valves, as well as measure the blood flow and oxygen levels in different parts of your heart.  For further information please visit [email or web address]  Please follow instruction sheet, as given.

## 2010-12-17 NOTE — Progress Notes (Signed)
Summary: triage  Phone Note Call from Patient Call back at Home Phone [phone] Call back at [phone]   Caller: Patient Call For: Dr. Marina Goodell Reason for Call: Talk to Nurse Summary of Call: does not want to wait until next available in July and would like to be sch'ed with Amy or Gunnar Fusi for diarrhea x 1 week Initial call taken by: Vallarie Mare,  April 23, 2010 11:45 AM  Follow-up for Phone Call        pt for the last 5 days has had diarrhea and abdominal pain.  She was on clarithromycin the first of June but only took 1.  No rectal bleeding, fever.  No nausea.  Appetite is decreased.   Very bloated and not sleeping well.  Pain is worse when lying on her right side.  Pt has family history of colon cancer and is very concerened.  Appt to day with Gunnar Fusi.  Follow-up by: Chales Abrahams CMA Duncan Dull),  April 23, 2010 2:05 PM

## 2010-12-17 NOTE — Progress Notes (Signed)
Summary: still sick  Phone Note Call from Patient   Caller: Patient Call For: [name] Reason for Call: Talk to Nurse Summary of Call: pt still taking mucinex not any  better. want to no when she should resume allergy shots Initial call taken by: Rickard Patience,  September 04, 2010 3:17 PM  Follow-up for Phone Call        Florentina Addison do you know when can pt resume her allergy shots? Philipp Deputy [facility]  September 04, 2010 5:07 PM    I am unsure of this but will send to [facility] for answer.Reynaldo Minium CMA  September 04, 2010 5:17 PM   Additional Follow-up for Phone Call Additional follow up Details #1::        I don't know when she stopped. I would like her to settle down after the current acute illness.  Why don't we ask her to come see me in about amonth and we will discuss her allergy vaccine then.  Additional Follow-up by: Waymon Budge MD,  September 04, 2010 9:12 PM

## 2010-12-17 NOTE — Progress Notes (Signed)
Summary: allergies- katie pls remind cy to review shot history for pt//td  Phone Note Call from Patient Call back at Home Phone [phone] Call back at [phone]   Caller: Patient Call For: young Summary of Call: pt stopped allergy shots (see ov 5/5). pt c/o "constantly sneezing. says she has used the OTC claritin as instructed, but this hasn't helped. she is "miserable" and wants to know if she can go back on the shots. also if she can- does she have any serum remaining?  Initial call taken by: Tivis Ringer, CNA,  Apr 14, 2010 9:12 AM  Follow-up for Phone Call        Pt had OV on 5/5 and it was  decided that she stop allergy vaccine. She ahs tried OTC claritin but pt states she is" miserable." SHe c/o head congestion, runny nose, sneezing all since stopping vaccine. Pt wants to know can she go back on the vaccine or should she try something else first. Please advise. Carron Curie CMA  Apr 14, 2010 10:09 AM   PT WOULD LIKE PREDNISONE CALLED TO PHARMACY TO HELP HER BREATHE [facility]  Rickard Patience,  Apr 14, 2010 3:19 PM  Additional Follow-up for Phone Call Additional follow up Details #1::        pt called again stating that she feels like she may have a sinus infection instead of just allergies.  see symptoms listed above. In addition to the allergu shot question, the pt is also requesting an RX for sinus congestion like prednisone. Please advise. Carron Curie CMA  Apr 14, 2010 3:55 PM allergies: PCN, morphine, omnicef    Additional Follow-up for Phone Call Additional follow up Details #2::    Per CDY-1) Will review record vaccine 2)give Prednisone 10mg  #20 take 4 x 2 days, 3 x 2 days, 2 x2days, 1 x 2 days, then stop 3) Biaxin 500mg  #14 take 1 by mouth two times a day no refills on either RX.Reynaldo Minium CMA  April 15, 2010 9:12 AM   lmom for pt that prednisone and [facility]iaxin [facility] [address]--also lm for her that dr young will review shot  history and give her a call back about restarting allergy shots.  New/Updated Medications: PREDNISONE 10 MG TABS (PREDNISONE) take 4 x 2 days, 3 x 2 days, 2 x2days, 1 x 2 days, then stop BIAXIN 500 MG TABS (CLARITHROMYCIN) 1 by mouth twice a day Prescriptions: BIAXIN 500 MG TABS (CLARITHROMYCIN) 1 by mouth twice a day  #14 x 0   Entered by:   Philipp Deputy CMA   Authorized by:   Waymon Budge MD   Signed by:   Philipp Deputy CMA on 04/15/2010   Method used:   Electronically to        [facility]  [phone]* (retail)       [address] [place], [place]  [address]       Ph: [phone] or [phone]       Fax: [phone]   RxID:   [id number] PREDNISONE 10 MG TABS (PREDNISONE) take 4 x 2 days, 3 x 2 days, 2 x2days, 1 x 2 days, then stop  #20 x 0   Entered by:   Philipp Deputy CMA   Authorized by:   Waymon Budge MD   Signed by:   Philipp Deputy CMA on 04/15/2010   Method used:   [facility]ronically to  [facility]  [phone]* (retail)       [address] [place], [place]  [address]       Ph: [phone] or [phone]       Fax: [phone]   RxID:   [id number]

## 2010-12-17 NOTE — Assessment & Plan Note (Signed)
Summary: GERD/Pain/LRH   History of Present Illness Visit Type: Follow-up Visit Primary GI MD: Angelica Flemings MD Primary Provider: Rene Paci, MD Chief Complaint: chest pain non-cardiac, some dysphagia x x 6 months History of Present Illness:   Patient is a 70year-old white female followed by Dr. Marina Goodell for GERD, chronic constipation, bloating. She is here with complaints of belching, chest pain radiating into shoulders and down left arm.  Four years ago patient began having problems with excessive belching. In addition to her daily PPI she started Rolaids, Tums, and Gas X. One month later had CABG. Burping got better, she stopped OTC medications but has remained on daily PPI. In the last 6 months patient has needed OTC meds again for excessive belching. In the last 3-4 weeks, with mild exertion, she has developed SOB and chest pain radiating into her shoulders and down left arm. Because symptoms were reminiscent of when she underwent CABG, patient called Dr. [name], her cardiologist, and underwent cardiac cath 10/28/10. With enough Tums and Gas-X her symptoms will usually subside. No dysphagia but patient has problems swallowing small pills.    GI Review of Systems    Reports abdominal pain, acid reflux, belching, bloating, and  chest pain.     Location of  Abdominal pain: upper abdomen.    Denies dysphagia with liquids, dysphagia with solids, heartburn, loss of appetite, nausea, vomiting, vomiting blood, weight loss, and  weight gain.        Denies anal fissure, black tarry stools, change in bowel habit, constipation, diarrhea, diverticulosis, fecal incontinence, heme positive stool, hemorrhoids, irritable bowel syndrome, jaundice, light color stool, liver problems, rectal bleeding, and  rectal pain.   Current Medications (verified): 1)  Metoprolol Tartrate 25 Mg Tabs (Metoprolol Tartrate) .... Take 1 By Mouth Two Times A Day 2)  Crestor 10 Mg  Tabs (Rosuvastatin Calcium) .... Take 1  Tablet By Mouth Once A Day 3)  Adult Aspirin Low Strength 81 Mg  Tbdp (Aspirin) .Marland Kitchen.. 1 Daily 4)  Zolpidem Tartrate 10 Mg  Tabs (Zolpidem Tartrate) .... Take 1/2 Tab By Mouth At Bedtime 5)  Caltrate 600 1500 Mg  Tabs (Calcium Carbonate) .... As Directed 6)  Senna-Plus 8.6-50 Mg  Tabs (Sennosides-Docusate Sodium) .... Take 1 Tablet By Mouth Once A Day 7)  Multivitamins   Tabs (Multiple Vitamin) .... Take 1 Tablet By Mouth Once A Day 8)  Fosamax 70 Mg Tabs (Alendronate Sodium) .... Once A Week 9)  Coq10 50 Mg/2.5gm Emul (Coenzyme Q10) .... Once A Day 10)  Levothyroxine Sodium 75 Mcg Tabs (Levothyroxine Sodium) .... Take 1 Tablet By Mouth Once A Day 11)  Allergy Vaccine 1:10 Gh .... Restart At 0.1 and Build, After Lapse. 12)  Mobic 15 Mg Tabs (Meloxicam) .... 1/4 of The Tablet Daily 13)  Pantoprazole Sodium 40 Mg Tbec (Pantoprazole Sodium) .... Take 1 Tablet By Mouth Once A Day  Allergies (verified): 1)  ! Penicillin 2)  ! Morphine 3)  ! * Omnicep 4)  ! * Clarithromycin  Past History:  Past Medical History: DIVERTICULITIS OF COLON (ICD-562.11) CONSTIPATION (ICD-564.00) DIVERTICULOSIS (ICD-562.10) GERD (ICD-530.81) BREAST CANCER (ICD-174.9) EROSIVE GASTRITIS (ICD-535.40) AORTIC STENOSIS (ICD-424.1), mild-moderate DVT (ICD-453.40) Hx of BRONCHITIS NOS (ICD-490) CORONARY ARTERY DISEASE (ICD-414.00) s/p multivessel CABG 2007 ASTHMA (ICD-493.90) ALLERGIC RHINITIS (ICD-477.9) RHINOSINUSITIS, RECURRENT (ICD-473.9)  Past Surgical History: Reviewed history from 04/23/2010 and no changes required. Breast-Mastectomy bilateral with reconstruction CABG 5 vessel Appendectomy right ankle arthroscopy Hysterectomy Tonsillectomy  Family History: Reviewed history from 03/27/2008 and no  changes required. Family History of Colon Cancer:her sister Mother with breast cancer and lung disease. Father with emphysema. Siblings with coronary artery disease.  Social History: Reviewed history from  03/27/2008 and no changes required. Patient never smoked. 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  Review of Systems       The patient complains of fatigue, heart rhythm changes, shortness of breath, and urine leakage.    Vital Signs:  Patient profile:   69 year old female Height:      65.5 inches Weight:      143.50 pounds BMI:     23 .60 Pulse rate:   76 / minute Pulse rhythm:   regular BP sitting:   110 / 60  (left arm) Cuff size:   regular  Vitals Entered By: June McMurray CMA Duncan Dull) (November 02, 2010 2:10 PM)  Physical Exam  General:  Well developed, well nourished, no acute distress. Head:  Normocephalic and atraumatic. Eyes:  Conjunctiva pink, no icterus.  Neck:  no obvious masses  Lungs:  Clear throughout to auscultation. Heart:  RRR. Murmur present. Abdomen:  Abdomen soft, nontender, nondistended. No obvious masses or hepatomegaly.Normal bowel sounds.  Msk:  Symmetrical with no gross deformities. Normal posture. Mid sternal tenderness but doesn't reproduce the pain for which patient is being evaluated. Extremities:  No palmar erythema, no edema.  Neurologic:  Alert and  oriented x4;  grossly normal neurologically. Skin:  Intact without significant lesions or rashes. Cervical Nodes:  No significant cervical adenopathy. Psych:  Alert and cooperative. Normal mood and affect.  Impression & Recommendations:  Problem # 1:  CHEST PAIN (ICD-786.50) Assessment Deteriorated Pain associated with mild exertion and radiates into shoulders and down left arm. She has some associated SOB as well. No underlying pulmonary disease. Cardiac catheterization performed on October 28, 2010 demonstrated severe native three-vessel coronary artery disease with 5/5 patent bypass grafts. Discharged home with recommendations to follow up with GI for uncontrolled GERD.  Patient has been on Protonix for years. Will try different PPI -  Nexium  30 minutes before breakfast. If no improvement in symptoms over next several days patient will try increasing Nexium to twice daily. Anti-reflux literature given. If patient doesn't improve after maximizing anti-reflux treatment, she may need EGD for further evaluation, though it is not clear why her symptoms occur mainly during times of mild physical exertion.   Patient Instructions: 1)  Take Nexium samples before breakfast once daily. 2)  If no improvement in a week bump it up to twice daily.  3)  We will be making an appointment with Willette Cluster ACNP when Dr. Marina Goodell is supervising MD.  4)  We have given you an antireflux brochure and diet information. 5)  Copy sent to : Otho Najjar, MD 6)  The medication list was reviewed and reconciled.  All changed / newly prescribed medications were explained.  A complete medication list was provided to the patient / caregiver.  Appended Document: GERD/Pain/LRH Called the pt and asked how she was doing.  She said she is some better the the Nexium is working well taking it once daily.   I tried to  run a percription for Nexium and her eligibility was pending for her perscriptions in EMR so I could not see if Nexium was on her formulary.  I can check it later .  She wanted me to ask Gunnar Fusi since she has a good many Pantoprazole Sodium through [facility], could she try this  medication in the AM and PM??   Appended Document: GERD/Pain/LRH Yes, 30 minutes prior to breakfast and dinner  Appended Document: GERD/Pain/LRH LM on pt's ans machine that per Gunnar Fusi, She can take the Pantoprazole Sodium 40 mg in the AM and the PM.  I asked her to call me after a week or so of doing this to let us know how that is working.

## 2010-12-17 NOTE — Miscellaneous (Signed)
Summary: Injection Record / [facility]    Injection Record / [facility]    Imported By: Lennie Odor 07/17/2010 10:21:37  _____________________________________________________________________  External Attachment:    Type:   Image     Comment:   External Document

## 2010-12-17 NOTE — Progress Notes (Signed)
Summary: update  Phone Note Call from Patient Call back at Home Phone [phone]   Caller: Patient Call For: Dr.  Jaquita Rector for Call: Talk to Nurse Summary of Call: Nexium did not work, she started taking protonix morning and night pain and indegestion has went away she does not feel like she needs to come in and see Margel Joens agian right now...just wanted you to know does not need a call back Initial call taken by: [place] Johnson,  November 24, 2010 11:30 AM  Follow-up for Phone Call        okay. Follow-up by: Willette Cluster NP,  December 03, 2010 10:45 AM

## 2010-12-19 ENCOUNTER — Ambulatory Visit (INDEPENDENT_AMBULATORY_CARE_PROVIDER_SITE_OTHER): Payer: Medicare Other | Admitting: Family Medicine

## 2010-12-19 ENCOUNTER — Encounter: Payer: Self-pay | Admitting: Family Medicine

## 2010-12-19 DIAGNOSIS — J019 Acute sinusitis, unspecified: Secondary | ICD-10-CM

## 2010-12-22 ENCOUNTER — Encounter: Payer: Self-pay | Admitting: Internal Medicine

## 2010-12-22 ENCOUNTER — Ambulatory Visit (INDEPENDENT_AMBULATORY_CARE_PROVIDER_SITE_OTHER): Payer: Medicare Other | Admitting: Internal Medicine

## 2010-12-22 DIAGNOSIS — J309 Allergic rhinitis, unspecified: Secondary | ICD-10-CM

## 2010-12-22 DIAGNOSIS — J019 Acute sinusitis, unspecified: Secondary | ICD-10-CM

## 2010-12-22 DIAGNOSIS — J301 Allergic rhinitis due to pollen: Secondary | ICD-10-CM

## 2010-12-22 DIAGNOSIS — J329 Chronic sinusitis, unspecified: Secondary | ICD-10-CM

## 2010-12-22 DIAGNOSIS — R071 Chest pain on breathing: Secondary | ICD-10-CM | POA: Insufficient documentation

## 2010-12-24 ENCOUNTER — Telehealth (INDEPENDENT_AMBULATORY_CARE_PROVIDER_SITE_OTHER): Payer: Self-pay | Admitting: *Deleted

## 2010-12-25 ENCOUNTER — Telehealth (INDEPENDENT_AMBULATORY_CARE_PROVIDER_SITE_OTHER): Payer: Self-pay | Admitting: *Deleted

## 2010-12-29 ENCOUNTER — Ambulatory Visit (INDEPENDENT_AMBULATORY_CARE_PROVIDER_SITE_OTHER): Payer: Medicare Other | Admitting: Internal Medicine

## 2010-12-29 ENCOUNTER — Encounter: Payer: Self-pay | Admitting: Internal Medicine

## 2010-12-29 ENCOUNTER — Other Ambulatory Visit: Payer: Medicare Other

## 2010-12-29 ENCOUNTER — Ambulatory Visit (INDEPENDENT_AMBULATORY_CARE_PROVIDER_SITE_OTHER): Payer: Medicare Other

## 2010-12-29 ENCOUNTER — Other Ambulatory Visit: Payer: Self-pay | Admitting: Internal Medicine

## 2010-12-29 ENCOUNTER — Ambulatory Visit (INDEPENDENT_AMBULATORY_CARE_PROVIDER_SITE_OTHER)
Admission: RE | Admit: 2010-12-29 | Discharge: 2010-12-29 | Disposition: A | Discharge status: Home / Self Care | Payer: Medicare Other | Source: Ambulatory Visit | Attending: Internal Medicine | Admitting: Internal Medicine

## 2010-12-29 ENCOUNTER — Telehealth (INDEPENDENT_AMBULATORY_CARE_PROVIDER_SITE_OTHER): Payer: Self-pay | Admitting: *Deleted

## 2010-12-29 DIAGNOSIS — J40 Bronchitis, not specified as acute or chronic: Secondary | ICD-10-CM

## 2010-12-29 DIAGNOSIS — J329 Chronic sinusitis, unspecified: Secondary | ICD-10-CM

## 2010-12-29 DIAGNOSIS — J301 Allergic rhinitis due to pollen: Secondary | ICD-10-CM

## 2010-12-29 DIAGNOSIS — J309 Allergic rhinitis, unspecified: Secondary | ICD-10-CM

## 2010-12-29 LAB — CBC WITH DIFFERENTIAL/PLATELET
Basophils Absolute: 0 10*3/uL (ref 0.0–0.1)
Eosinophils Absolute: 0.3 10*3/uL (ref 0.0–0.7)
Hemoglobin: 12.3 g/dL (ref 12.0–15.0)
Lymphocytes Relative: 40.6 % (ref 12.0–46.0)
MCHC: 33.5 g/dL (ref 30.0–36.0)
Monocytes Relative: 7 % (ref 3.0–12.0)
Neutro Abs: 2.7 10*3/uL (ref 1.4–7.7)
Neutrophils Relative %: 47.5 % (ref 43.0–77.0)
RBC: 4.19 Mil/uL (ref 3.87–5.11)
RDW: 14.4 % (ref 11.5–14.6)

## 2010-12-31 ENCOUNTER — Ambulatory Visit: Payer: Medicare Other | Admitting: Hematology & Oncology

## 2010-12-31 NOTE — Assessment & Plan Note (Signed)
Summary: SINUS INFECTION/NWS   Vital Signs:  Patient profile:   70 year old female Weight:      142.25 pounds BMI:     23.40 Temp:     97.9 degrees F oral Pulse rate:   60 / minute Pulse rhythm:   regular BP sitting:   118 / 72  (left arm) Cuff size:   regular  Vitals Entered By: Selena Batten Dance CMA (AAMA) (December 19, 2010 9:18 AM) CC: ? Sinus Infection   History of Present Illness: has been sick for 3 weeks -- had to cancel a trip  started with hoarseness and sore throat and drip  lots of sneezing  then sinus pain and pressure - both above and below the eyes  really bad congestion  has taken lots of antihistamines   is using nasal saline spray and afrin   does not run fever   starting to cough -- - dry    usually sees Dr Maple Hudson - has allergies  took zpack on 25th of last month  she sometimes gets some sort of nasal nebulizer treatments ?    Allergies: 1)  ! Penicillin 2)  ! Morphine 3)  ! * Omnicep 4)  ! * Clarithromycin 5)  ! Levaquin 6)  ! Naprosyn 7)  ! * Tequin 8)  ! Codeine  Past History:  Past Medical History: Last updated: 11/20/2010 DIVERTICULITIS OF COLON GERD BREAST CANCER, hx 1991 AORTIC STENOSIS, mild-moderate DVT hx CORONARY ARTERY DISEASE s/p multivessel CABG 2007 ASTHMA ALLERGIC RHINITIS RHINOSINUSITIS, RECURRENT  Hyperlipidemia Osteoporosis  MD roster: card - [facility] - perry allg/pulm -young derm - [facility]  ([facility])  Hypothyroidism  Past Surgical History: Last updated: 11/11/2010 Breast-Mastectomy bilateral with reconstruction 1992 CABG 5 vessel 1997 Appendectomy 1958 right ankle arthroscopy Hysterectomy 1990 Tonsillectomy 1959  Family History: Last updated: 03/27/2008 Family History of Colon Cancer:her sister Mother with breast cancer and lung disease. Father with emphysema. Siblings with coronary artery disease.  Social History: Last updated: 11/11/2010 Patient never smoked.  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  Risk Factors: Alcohol Use: 0 (11/11/2010) Exercise: yes (11/11/2010)  Risk Factors: Smoking Status: never (11/11/2010)  Review of Systems General:  Complains of fatigue, loss of appetite, and malaise. Eyes:  Complains of eye irritation; denies blurring and discharge. ENT:  Complains of nasal congestion, postnasal drainage, sinus pressure, and sore throat. CV:  Denies chest pain or discomfort and palpitations. Resp:  Denies cough, shortness of breath, and wheezing. GI:  Denies indigestion and nausea. Derm:  Denies lesion(s), poor wound healing, and rash. Neuro:  Complains of headaches.  Physical Exam  General:  Well-developed,well-nourished,in no acute distress; alert,appropriate and cooperative throughout examination Head:  normocephalic, atraumatic, and no abnormalities observed.  sinus pain in frontal and maxillary sinuses on palp  Eyes:  vision grossly intact, pupils equal, pupils round, pupils reactive to light, and no injection.   Ears:  R ear normal and L ear normal.   Nose:  nares are injected and congested bilaterally  Mouth:  pharynx pink and moist, no erythema, and no exudates.   Neck:  No deformities, masses, or tenderness noted. Lungs:  Normal respiratory effort, chest expands symmetrically. Lungs are clear to auscultation, no crackles or wheezes.   Heart:  normal rate, regular rhythm, no murmur, and no rub. BLE without edema. normal DP pulses and normal cap refill in all 4 extremities  Skin:  Intact without suspicious lesions or rashes Cervical Nodes:  No lymphadenopathy noted Psych:  nl affect    Impression & Recommendations:  Problem # 1:  SINUSITIS - ACUTE-NOS (ICD-461.9) Assessment New  with 3 weeks of nasal congestion and sinus pain unresp to zpak but has many abx allergies  will cover with bactrim DS and update  fluids/ recommend sympt care- see pt  instructions  -- trial of mucinex  pt advised to update me if symptoms worsen or do not improve - esp if she develops wheezing  Her updated medication list for this problem includes:    Bactrim Ds 800-160 Mg Tabs (Sulfamethoxazole-trimethoprim) .Marland Kitchen... 1 by mouth two times a day for 10 days  Orders: Prescription Created Electronically [phone])  Complete Medication List: 1)  Metoprolol Tartrate 25 Mg Tabs (Metoprolol tartrate) .... Take 1 by mouth two times a day 2)  Crestor 10 Mg Tabs (Rosuvastatin calcium) .... Take 1 tablet by mouth once a day 3)  Adult Aspirin Low Strength 81 Mg Tbdp (Aspirin) .Marland Kitchen.. 1 daily 4)  Zolpidem Tartrate 10 Mg Tabs (Zolpidem tartrate) .... Take 1/2 tab by mouth at bedtime id [id number] 5)  Caltrate 600 1500 Mg Tabs (Calcium carbonate) .... As directed 6)  Senna-plus 8.6-50 Mg Tabs (Sennosides-docusate sodium) .... Take 1 tablet by mouth once a day 7)  Multivitamins Tabs (Multiple vitamin) .... Take 1 tablet by mouth once a day 8)  Fosamax 70 Mg Tabs (Alendronate sodium) .... Once a week - hold until further notice 9)  Coq10 50 Mg/2.5gm Emul (Coenzyme q10) .... Once a day 10)  Levothyroxine Sodium 75 Mcg Tabs (Levothyroxine sodium) .... Take 1 tablet by mouth once a day 11)  Allergy Vaccine 1:10 Gh  .... Restart at 0.1 and build, after lapse. 12)  Mobic 15 Mg Tabs (Meloxicam) .... 1/4 of the tablet daily 13)  Protonix 40 Mg Tbec (Pantoprazole sodium) .Marland Kitchen.. 1 by mouth once daily 14)  Bactrim Ds 800-160 Mg Tabs (Sulfamethoxazole-trimethoprim) .Marland Kitchen.. 1 by mouth two times a day for 10 days  Patient Instructions: 1)  you can try mucinex over the counter twice daily as directed and nasal saline spray for congestion 2)  tylenol over the counter as directed may help with aches, headache and fever 3)  take bactrim ds as directed  4)  call if symptoms worsen or if not improved in 4-5 days  Prescriptions: BACTRIM DS 800-160 MG TABS (SULFAMETHOXAZOLE-TRIMETHOPRIM) 1 by mouth two  times a day for 10 days  #20 x 0   Entered and Authorized by:   Judith Part MD   Signed by:   Judith Part MD on 12/19/2010   Method used:   [facility] to        [facility]  [phone]* (retail)       [address] [place], [place]  [address]       Ph: [phone] or [phone]       Fax: [phone]   RxID:   [id number]    Orders Added: 1)  Prescription Created Electronically [G8553] 2)  Est. Patient Level III [638[address]]    Current Allergies (reviewed today): ! PENICILLIN ! MORPHINE ! * OMNICEP ! * CLARITHROMYCIN ! LEVAQUIN ! NAPROSYN ! * TEQUIN ! CODEINE

## 2010-12-31 NOTE — Progress Notes (Signed)
Summary: coughing > cipro, tramadol rx  Phone Note Call from Patient Call back at Home Phone [phone]   Caller: Patient Call For: young Summary of Call: Pt c/o coughing up chunks of dark yellow phlegm since today she is concerned that the constant coughing is putting a strain on her heart pls advise.//[facility] Initial call taken by: Darletta Moll,  December 25, 2010 4:00 PM  Follow-up for Phone Call        called and spoke with pt.  pt  was just seen by CY on 12-22-2010 for a sick visit.  Pt called yesterday and was told to stop Bactrim d/t allergic reaction.  Pt states she is getting "worse."  Pt c/o increased coughing.  coughing up dark yellow sputum.  Pt states throat is sore and hoarseness d/t increased coughing.  Pt also c/o wheezing and tightness in chest.  Denies fever, chills or sweats. Please advise.  Thank you.  Aundra Millet Reynolds LPN  December 25, 2010 4:46 PM  Allergies (verified):  1)  ! Penicillin 2)  ! Morphine 3)  ! * Omnicep 4)  ! * Clarithromycin 5)  ! Levaquin 6)  ! Naprosyn 7)  ! * Tequin 8)  ! Codeine  Additional Follow-up for Phone Call Additional follow up Details #1::        Since levaquin just associated with nausea,  Offer Cipro 250, # 14, no ref two times a day   tramadol 50 mg, # 10, no ref 1 two times a day as needed cough Additional Follow-up by: Waymon Budge MD,  December 25, 2010 4:59 PM    Additional Follow-up for Phone Call Additional follow up Details #2::    Called, spoke with pt.  She was informed of above recs per CDY and verbalized understanding.  [facility] Will call office back if sxs do not improve or worsen. Follow-up by: Gweneth Dimitri RN,  December 25, 2010 5:06 PM  New/Updated Medications: CIPRO 250 MG TABS (CIPROFLOXACIN HCL) Take 1 tablet by mouth two times a day TRAMADOL HCL 50 MG TABS (TRAMADOL HCL) Take 1 tablet by mouth two times a day as needed cough Prescriptions: TRAMADOL HCL 50 MG TABS  (TRAMADOL HCL) Take 1 tablet by mouth two times a day as needed cough  #10 x 0   Entered by:   Gweneth Dimitri RN   Authorized by:   Waymon Budge MD   Signed by:   Gweneth Dimitri RN on 12/25/2010   Method used:   Electronically to        [facility]  [phone]* (retail)       [address] [place], [place]  [address]       Ph: [phone] or [phone]       Fax: [phone]   RxID:   [id number] CIPRO 250 MG TABS (CIPROFLOXACIN HCL) Take 1 tablet by mouth two times a day  #14 x 0   Entered by:   Gweneth Dimitri RN   Authorized by:   Waymon Budge MD   Signed by:   Gweneth Dimitri RN on 12/25/2010   Method used:   [facility]ronically to        [facility]  [phone]* (retail)       [address] [place], [place]  [address]       Ph: [phone] or [phone]  Fax: [phone]   RxID:   [id number]

## 2010-12-31 NOTE — Assessment & Plan Note (Signed)
Summary: bronchitis//jd   Primary Provider/Referring Provider:  Rene Paci, MD  CC:  Acute visit-? bronchitis-seen 12-19-10; sick x 1 month; ? broken rib-pain on right side when sneezing; cough, sore throat, hoarseness, and stopped up at times..  History of Present Illness: September 01, 2010- COPD, Allergic Rhinitis, Rhinosiusitis, CAD/ Aortic Stenosis Nurse CC: Acute visit-no better and has cruise to go on Thursday; wants depo and neb tx. Still coughing, sore throat, sinus pressure, sense of drainage, scant yelloow, maybe 1 degree temp elevation. Chest not tight, no nausea. Now taking Avelox, but not the prednisone.   September 24, 2010- COPD, Allergic Rhinitis, Rhinosiusitis, CAD/ Aortic Stenosis Nurse-CC: Acute visit-staying hoarse,runny nose occasionally-clear,bad headaches R ear "crackles" and decreased hearing, fatigued feelings. She had cancelled trip to [place] due to persistent rhinitis/  bronchitis syndrome, and says she still isn't well. Now c/o intermittent pounding/ pressure frontal headache, transient blurry vison w/o aura, nausea. Relieved by tylenol. Right ear crackles and pops. Known hearing impairment. Chest is fine. Claritin was no help with some clear nasal drainage.   December 22, 2010-  COPD, Allergic Rhinitis, Rhinosiusitis, CAD/ Aortic Stenosis Nurse-CC: Acute visit-? bronchitis-seen 12-19-10; sick x 1 month; ? broken rib-pain on right side when sneezing; cough, sore throat,hoarseness, stopped up at times. Seen 3 days ago by Dr Milinda Antis after Zpak and dx'd acute sinusitis, treated with bactrim. Has been sneezing rpeatedly for 2 weeks. Yesterday onset tussive sharp left lateral rib pain, some better after heating pad last night. Stays hoarse and continues protonix two times a day for dyspepsia w/o heartburn. Used Neti pot often.  CT sinus 09/28/10- mild chronic sinusitis.  Hosp in Dec for chest pain with cardiac cath showing patent vessels, old 5vCABG.      Preventive  Screening-Counseling & Management  Alcohol-Tobacco     Alcohol drinks/day: 0     Alcohol Counseling: not indicated; patient does not drink     Smoking Status: never     Tobacco Counseling: not indicated; no tobacco use  Current Medications (verified): 1)  Metoprolol Tartrate 25 Mg Tabs (Metoprolol Tartrate) .... Take 1 By Mouth Two Times A Day 2)  Crestor 10 Mg  Tabs (Rosuvastatin Calcium) .... Take 1 Tablet By Mouth Once A Day 3)  Adult Aspirin Low Strength 81 Mg  Tbdp (Aspirin) .Marland Kitchen.. 1 Daily 4)  Zolpidem Tartrate 10 Mg  Tabs (Zolpidem Tartrate) .... Take 1/2 Tab By Mouth At Bedtime Id [id number] 5)  Caltrate 600 1500 Mg  Tabs (Calcium Carbonate) .... As Directed 6)  Senna-Plus 8.6-50 Mg  Tabs (Sennosides-Docusate Sodium) .... Take 1 Tablet By Mouth Once A Day 7)  Multivitamins   Tabs (Multiple Vitamin) .... Take 1 Tablet By Mouth Once A Day 8)  Fosamax 70 Mg Tabs (Alendronate Sodium) .... Once A Week - Hold Until Further Notice 9)  Coq10 50 Mg/2.5gm Emul (Coenzyme Q10) .... Once A Day 10)  Levothyroxine Sodium 75 Mcg Tabs (Levothyroxine Sodium) .... Take 1 Tablet By Mouth Once A Day 11)  Allergy Vaccine 1:10 Gh .... Restart At 0.1 and Build, After Lapse. 12)  Mobic 15 Mg Tabs (Meloxicam) .... 1/4 of The Tablet Daily 13)  Protonix 40 Mg Tbec (Pantoprazole Sodium) .Marland Kitchen.. 1 By Mouth Once Daily 14)  Bactrim Ds 800-160 Mg Tabs (Sulfamethoxazole-Trimethoprim) .Marland Kitchen.. 1 By Mouth Two Times A Day For 10 Days  Allergies (verified): 1)  ! Penicillin 2)  ! Morphine 3)  ! * Omnicep 4)  ! * Clarithromycin 5)  ! Levaquin 6)  !  Naprosyn 7)  ! * Tequin 8)  ! Codeine  Past History:  Past Medical History: Last updated: 11/20/2010 DIVERTICULITIS OF COLON GERD BREAST CANCER, hx 1991 AORTIC STENOSIS, mild-moderate DVT hx CORONARY ARTERY DISEASE s/p multivessel CABG 2007 ASTHMA ALLERGIC RHINITIS RHINOSINUSITIS, RECURRENT  Hyperlipidemia Osteoporosis  MD roster: card - [facility] GI -  perry allg/pulm -Vitor Overbaugh derm - dan jones onc  ([facility]b)  Hypothyroidism  Past Surgical History: Last updated: 11/11/2010 Breast-Mastectomy bilateral with reconstruction 1992 CABG 5 vessel 1997 Appendectomy 1958 right ankle arthroscopy Hysterectomy 1990 Tonsillectomy 1959  Family History: Last updated: 03/27/2008 Family History of Colon Cancer:her sister Mother with breast cancer and lung disease. Father with emphysema. Siblings with coronary artery disease.  Social History: Last updated: 11/11/2010 Patient never smoked.  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  Risk Factors: Alcohol Use: 0 (12/22/2010) Exercise: yes (11/11/2010)  Risk Factors: Smoking Status: never (12/22/2010)  Review of Systems      See HPI       The patient complains of shortness of breath with activity, non-productive cough, nasal congestion/difficulty breathing through nose, and sneezing.  The patient denies shortness of breath at rest, productive cough, coughing up blood, chest pain, irregular heartbeats, acid heartburn, indigestion, loss of appetite, weight change, abdominal pain, difficulty swallowing, sore throat, tooth/dental problems, and headaches.    Vital Signs:  Patient profile:   70 year old female Height:      65.5 inches Weight:      143.38 pounds BMI:     23.58 O2 Sat:      98  % on Room air Pulse rate:   62 / minute BP sitting:   118 / 62  (right arm) Cuff size:   regular  Vitals Entered By: Reynaldo Minium CMA (December 22, 2010 3:57 PM)  O2 Flow:  Room air CC: Acute visit-? bronchitis-seen 12-19-10; sick x 1 month; ? broken rib-pain on right side when sneezing; cough, sore throat,hoarseness, stopped up at times.   Physical Exam  Additional Exam:  General: A/Ox3; pleasant and cooperative, NAD, SKIN: no rash, lesions. Looks pale NODES: no lymphadenopathy HEENT: [place]/AT, EOM- WNL, Conjuctivae-  clear, PERRLA, TM-large crust of wax right canal, nonobstructing. , Nose- minor crusting, Throat- normal without drainage or exudate,  Mallampati  II, hoarse NECK: Supple w/ fair ROM, JVD- none, normal carotid impulses w/o bruits Thyroid- normal to palpation CHEST: Clear to P&A,  HEART: RRR, 2/6 SEM- AS ABDOMEN: Soft and nl;  EAV:WUJW, nl pulses, no edema  NEURO: Grossly intact to observation      Impression & Recommendations:  Problem # 1:  SINUSITIS - ACUTE-NOS (ICD-461.9)  Recurrent rhinosinusitis.  I remain suspicious that she refluxes enough to maintain hoarseness.                                               Consider ENT  We will give the neb and depo she wants for today as she finsihes bactrim started this weekend.  She asked about prophyllaxis and we discussed available information on zinc products and vitaminD She continues allergy vaccine. She will keep appointment for retesting.  Her updated medication list for this problem includes:    Bactrim Ds 800-160 Mg Tabs (Sulfamethoxazole-trimethoprim) .Marland Kitchen... 1 by mouth two times a day for 10 days  Problem #  2:  CHEST WALL PAIN, ACUTE (ICD-786.52) Sharp pleuritic pain starting with hard sneeze yesterday. This is most c/w cracked rib or torn intercostal muscle. It is getting better with heat and I doubt pneumthorax or pneumonia w/ pleurisy. We can get CXR if it doesn't clear.  Her updated medication list for this problem includes:    Adult Aspirin Low Strength 81 Mg Tbdp (Aspirin) .Marland Kitchen... 1 daily    Mobic 15 Mg Tabs (Meloxicam) .Marland Kitchen... 1/4 of the tablet daily  Other Orders: Est. Patient Level III (16109) Admin of Therapeutic Inj  intramuscular or subcutaneous (60454) Depo- Medrol 80mg  (J1040) Nebulizer Tx (09811)  Patient Instructions: 1)  Keep scheduled appointment- please call earlier as needed 2)  neb neo nasal 3)  depo 80 4)  Finish the bactim sulfa antibiotic 5)  Use the Neti pot when you feel it might help 6)  We  discussed Zinc based products like Zicam and  7)     vitamin D3 2000 units once daily     Medication Administration  Injection # 1:    Medication: Depo- Medrol 80mg     Diagnosis: SINUSITIS - ACUTE-NOS (ICD-461.9)    Route: SQ    Site: RUOQ gluteus    Exp Date: 05/2013    Lot #: [id number]    Mfr: Pharmacia    Patient tolerated injection without complications    Given by: Reynaldo Minium CMA (December 22, 2010 4:54 PM)  Medication # 1:    Medication: EMR miscellaneous medications    Diagnosis: SINUSITIS - ACUTE-NOS (ICD-461.9)    Dose: 3drops    Route: intranasal    Exp Date: 08-2012    Lot #: [id number]    Mfr: Novartis    Comments: 4Way Fast Acting    Patient tolerated medication without complications    Given by: Reynaldo Minium CMA (December 22, 2010 4:55 PM)  Orders Added: 1)  Est. Patient Level III [62130] 2)  Admin of Therapeutic Inj  intramuscular or subcutaneous [96372] 3)  Depo- Medrol 80mg  [J1040] 4)  Nebulizer Tx [86578]

## 2010-12-31 NOTE — Progress Notes (Signed)
Summary: Reaction to sulfur  Phone Note Call from Patient Call back at Home Phone [phone] Call back at cell [phone]   Caller: Patient Summary of Call: Patient says that sulfur she is taking is not working.  Also cheeks are bright red which she thinks may be allergic reaction.  She is breaking out in cold sweat.  Please call back. Initial call taken by: Leonette Monarch,  December 24, 2010 10:00 AM  Follow-up for Phone Call        [facility] with pt.  She was seen by Dr Milinda Antis on 2/3- started on bactrim ds and then saw Dr Maple Hudson on 2/7- given depo, neonasal neb tx, and was advised to finish abx per Dr Milinda Antis.  Today pt states that she is not improving at all- c/o sore throat, sinus pressure, dry cough, chills, fatigue, aches.  She states that nothing is helping- still taking bactrim, mucinex without any relief.  She also thinks may be having reaction to bactrim b/c her face is very red.  Pls advise thanks! Allergies (verified):  1)  ! Penicillin 2)  ! Morphine 3)  ! * Omnicep 4)  ! * Clarithromycin 5)  ! Levaquin 6)  ! Naprosyn 7)  ! * Tequin 8)  ! Codeine  Follow-up by: Vernie Murders,  December 24, 2010 10:12 AM  Additional Follow-up for Phone Call Additional follow up Details #1::        I think she has now had 6 days of Bactrim/ sulfa from Dr Milinda Antis. Doubt she is allergic to it, but would feel comfortable that it has done what it can and suggest she stop it.  if she has an infection, it is likely viral, in which case no antibiotic will help and she will need to wait it out with fluids, rest and simple cold remedies. I would like to get labs on her if she isn't better in a few days.   Additional Follow-up by: Waymon Budge MD,  December 24, 2010 11:54 AM    Additional Follow-up for Phone Call Additional follow up Details #2::    Spoke with pt and notified of recs per CDY.  Pt verbalized understanding. Follow-up by: Vernie Murders,  December 24, 2010 12:03 PM

## 2011-01-05 ENCOUNTER — Telehealth (INDEPENDENT_AMBULATORY_CARE_PROVIDER_SITE_OTHER): Payer: Self-pay | Admitting: *Deleted

## 2011-01-06 ENCOUNTER — Encounter: Payer: Self-pay | Admitting: Internal Medicine

## 2011-01-06 ENCOUNTER — Institutional Professional Consult (permissible substitution): Payer: Self-pay | Admitting: Internal Medicine

## 2011-01-06 NOTE — Progress Notes (Signed)
Summary: no voice sick/cb  Phone Note Call from Patient Call back at Home Phone [phone]   Caller: Patient Call For: young Summary of Call: pt has no voice needs help cant handle being sick anymore Initial call taken by: Lacinda Axon,  December 29, 2010 12:56 PM  Follow-up for Phone Call        Spoke with pt.  She is c/o increased cough and hoarsness.  Also states has been wheezing.  Appt sched with CDY for this pm at 3:45 pm Follow-up by: Vernie Murders,  December 29, 2010 1:59 PM

## 2011-01-06 NOTE — Miscellaneous (Signed)
Summary: Injection Financial risk analyst   Imported By: Sherian Rein 12/30/2010 15:00:43  _____________________________________________________________________  External Attachment:    Type:   Image     Comment:   External Document

## 2011-01-06 NOTE — Assessment & Plan Note (Signed)
Summary: cough//lmr   Primary Provider/Referring Provider:  Rene Paci, MD  CC:  Acute visit-cough still-productive at times-green in color..  History of Present Illness: September 24, 2010- COPD, Allergic Rhinitis, Rhinosiusitis, CAD/ Aortic Stenosis Nurse-CC: Acute visit-staying hoarse,runny nose occasionally-clear,bad headaches R ear "crackles" and decreased hearing, fatigued feelings. She had cancelled trip to [place] due to persistent rhinitis/  bronchitis syndrome, and says she still isn't well. Now c/o intermittent pounding/ pressure frontal headache, transient blurry vison w/o aura, nausea. Relieved by tylenol. Right ear crackles and pops. Known hearing impairment. Chest is fine. Claritin was no help with some clear nasal drainage.   December 22, 2010-  COPD, Allergic Rhinitis, Rhinosiusitis, CAD/ Aortic Stenosis Nurse-CC: Acute visit-? bronchitis-seen 12-19-10; sick x 1 month; ? broken rib-pain on right side when sneezing; cough, sore throat,hoarseness, stopped up at times. Seen 3 days ago by Dr Milinda Antis after Zpak and dx'd acute sinusitis, treated with bactrim. Has been sneezing rpeatedly for 2 weeks. Yesterday onset tussive sharp left lateral rib pain, some better after heating pad last night. Stays hoarse and continues protonix two times a day for dyspepsia w/o heartburn. Used Neti pot often.  CT sinus 09/28/10- mild chronic sinusitis.  Hosp in Dec for chest pain with cardiac cath showing patent vessels, old 5vCABG.   December 29, 2010- COPD, Allergic Rhinitis, Rhinosiusitis, CAD/ Aortic Stenosis Nurse-CC: Acute visit-cough still-productive at times-green in color. Took Bactim till face got flushed and she thought it might be allergy to sulfa. Then took cipro.  She has gotten more hoarse, with runny nose and frontal headache, still some cough, malaise. Says she never runs fever, can't ever hear to know if her ears are stopped up. Appetite poor. Coughs after eating,  but says never  reflux or heartburn. Wants to vomit but can't- as part of cough till she feels need to retch. Finally will get up a small green mucus plug. Feels sore through mid chest.    Preventive Screening-Counseling & Management  Alcohol-Tobacco     Alcohol drinks/day: 0     Alcohol Counseling: not indicated; patient does not drink     Smoking Status: never     Tobacco Counseling: not indicated; no tobacco use  Current Medications (verified): 1)  Metoprolol Tartrate 25 Mg Tabs (Metoprolol Tartrate) .... Take 1 By Mouth Two Times A Day 2)  Crestor 10 Mg  Tabs (Rosuvastatin Calcium) .... Take 1 Tablet By Mouth Once A Day 3)  Adult Aspirin Low Strength 81 Mg  Tbdp (Aspirin) .Marland Kitchen.. 1 Daily 4)  Zolpidem Tartrate 10 Mg  Tabs (Zolpidem Tartrate) .... Take 1/2 Tab By Mouth At Bedtime Id [id number] 5)  Caltrate 600 1500 Mg  Tabs (Calcium Carbonate) .... As Directed 6)  Senna-Plus 8.6-50 Mg  Tabs (Sennosides-Docusate Sodium) .... Take 1 Tablet By Mouth Once A Day 7)  Multivitamins   Tabs (Multiple Vitamin) .... Take 1 Tablet By Mouth Once A Day 8)  Fosamax 70 Mg Tabs (Alendronate Sodium) .... Once A Week - Hold Until Further Notice 9)  Coq10 50 Mg/2.5gm Emul (Coenzyme Q10) .... Once A Day 10)  Levothyroxine Sodium 75 Mcg Tabs (Levothyroxine Sodium) .... Take 1 Tablet By Mouth Once A Day 11)  Allergy Vaccine 1:10 Gh .... Restart At 0.1 and Build, After Lapse. 12)  Mobic 15 Mg Tabs (Meloxicam) .... 1/4 of The Tablet Daily 13)  Protonix 40 Mg Tbec (Pantoprazole Sodium) .Marland Kitchen.. 1 By Mouth Once Daily 14)  Cipro 250 Mg Tabs (Ciprofloxacin Hcl) .... Take 1  Tablet By Mouth Two Times A Day 15)  Tramadol Hcl 50 Mg Tabs (Tramadol Hcl) .... Take 1 Tablet By Mouth Two Times A Day As Needed Cough  Allergies (verified): 1)  ! Penicillin 2)  ! Morphine 3)  ! * Omnicep 4)  ! * Clarithromycin 5)  ! Levaquin 6)  ! Naprosyn 7)  ! * Tequin 8)  ! Codeine  Past History:  Past Medical History: Last updated:  11/20/2010 DIVERTICULITIS OF COLON GERD BREAST CANCER, hx 1991 AORTIC STENOSIS, mild-moderate DVT hx CORONARY ARTERY DISEASE s/p multivessel CABG 2007 ASTHMA ALLERGIC RHINITIS RHINOSINUSITIS, RECURRENT  Hyperlipidemia Osteoporosis  MD roster: card - cooper GI - perry allg/pulm -young derm - dan jones onc  (karb)  Hypothyroidism  Past Surgical History: Last updated: 11/11/2010 Breast-Mastectomy bilateral with reconstruction 1992 CABG 5 vessel 1997 Appendectomy 1958 right ankle arthroscopy Hysterectomy 1990 Tonsillectomy 1959  Family History: Last updated: 03/27/2008 Family History of Colon Cancer:her sister Mother with breast cancer and lung disease. Father with emphysema. Siblings with coronary artery disease.  Social History: Last updated: 11/11/2010 Patient never smoked.  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  Risk Factors: Alcohol Use: 0 (12/29/2010) Exercise: yes (11/11/2010)  Risk Factors: Smoking Status: never (12/29/2010)  Review of Systems      See HPI       The patient complains of shortness of breath with activity, productive cough, non-productive cough, nasal congestion/difficulty breathing through nose, and sneezing.  The patient denies shortness of breath at rest, chest pain, irregular heartbeats, acid heartburn, indigestion, loss of appetite, weight change, abdominal pain, difficulty swallowing, sore throat, tooth/dental problems, and headaches.    Vital Signs:  Patient profile:   69 year old female Height:      65.5 inches Weight:      142.50 pounds BMI:     23.44 O2 Sat:      98  % on Room air Pulse rate:   59 / minute BP sitting:   116 / 64  (left arm) Cuff size:   regular  Vitals Entered By: Reynaldo Minium CMA (December 29, 2010 4:06 PM)  O2 Flow:  Room air CC: Acute visit-cough still-productive at times-green in color.   Physical  Exam  Additional Exam:  General: A/Ox3; pleasant and cooperative, NAD, SKIN: no rash, lesions. Looks pale NODES: no lymphadenopathy HEENT: [place]/AT, EOM- WNL, Conjuctivae- clear, PERRLA, TM-clear. , Nose- blowing nose, Throat- normal without drainage or exudate,  Mallampati  II, hoarse strained vocal quality NECK: Supple w/ fair ROM, JVD- none, normal carotid impulses w/o bruits Thyroid- normal to palpation CHEST: few rhonchi right lateral chest,  barking cough, no wheeze or dullness. ,  HEART: RRR,1- 2/6 SEM- AS ABDOMEN: Soft and nl;  JYN:WGNF, nl pulses, no edema  NEURO: Grossly intact to observation      Impression & Recommendations:  Problem # 1:  RHINOSINUSITIS, RECURRENT (ICD-473.9) Horseness and gough could be secondary to sinusitis, although previous CT sinus didn't suggest much. She was due for skin test update, but will continue current vaccine till this can be done.  Problem # 2:  TRACHEOBRONCHITIS (ICD-490)  I think she is getting overlapping acute rhinitis/ bronchits episodes. I can't prove or exclude a reflux component. This is not allergy. We will get CXR, CBC and try Avelox. On return, Daliresp can be considered.  The following medications were removed from the medication list:    Bactrim Ds 800-160 Mg Tabs (  Sulfamethoxazole-trimethoprim) .Marland Kitchen... 1 by mouth two times a day for 10 days Her updated medication list for this problem includes:    Cipro 250 Mg Tabs (Ciprofloxacin hcl) .Marland Kitchen... Take 1 tablet by mouth two times a day    Avelox 400 Mg Tabs (Moxifloxacin hcl) .Marland Kitchen... 1 daily  Medications Added to Medication List This Visit: 1)  Avelox 400 Mg Tabs (Moxifloxacin hcl) .Marland Kitchen.. 1 daily  Other Orders: Est. Patient Level III (16109) TLB-CBC Platelet - w/Differential (85025-CBCD) T-2 View CXR (71020TC)  Patient Instructions: 1)  Please schedule a follow-up appointment in 3 weeks 2)   We will consider trying a new medicine called Daliresp at that point, but we need to talk  about it before we start it- it is not a med I would start over the phone.  3)  Script for Avelox 4)  A chest x-ray has been recommended.  Your imaging study may require preauthorization.  5)  Lab Prescriptions: AVELOX 400 MG TABS (MOXIFLOXACIN HCL) 1 daily  #7 x 0   Entered and Authorized by:   Waymon Budge MD   Signed by:   Waymon Budge MD on 12/29/2010   Method used:   Electronically to        [facility]  [phone]* (retail)       [address] [place], [place]  [address]       Ph: [phone] or [phone]       Fax: [phone]   RxID:   [id number]

## 2011-01-07 ENCOUNTER — Telehealth (INDEPENDENT_AMBULATORY_CARE_PROVIDER_SITE_OTHER): Payer: Self-pay | Admitting: *Deleted

## 2011-01-08 ENCOUNTER — Ambulatory Visit (INDEPENDENT_AMBULATORY_CARE_PROVIDER_SITE_OTHER): Payer: Medicare Other

## 2011-01-08 DIAGNOSIS — J301 Allergic rhinitis due to pollen: Secondary | ICD-10-CM

## 2011-01-11 ENCOUNTER — Ambulatory Visit (INDEPENDENT_AMBULATORY_CARE_PROVIDER_SITE_OTHER): Payer: Medicare Other

## 2011-01-11 DIAGNOSIS — J301 Allergic rhinitis due to pollen: Secondary | ICD-10-CM

## 2011-01-12 NOTE — Progress Notes (Signed)
Summary: pt requesting referral to ENT  Phone Note Outgoing Call   Summary of Call: called and spoke with pt about her cxr and lab results----pt is aware of these results but pt is requesting a referral to see ENT----she is concerned that she is unable to get her meds down--hoarseness and been sick x 5 weeks.  she feels like the symptoms are going back to the early part of the 5 weeks with sneezing and runny nose---pt just does not feel well.  please advise.   Randell Loop CMA  January 05, 2011 9:30 AM   Follow-up for Phone Call        Per CDY-ok to refer to [facility] ENT.Reynaldo Minium CMA  January 05, 2011 10:01 AM   Order sent to [facility] for ENT referral to [facility]. Abigail Miyamoto RN  January 05, 2011 10:18 AM

## 2011-01-12 NOTE — Progress Notes (Signed)
Summary: wants to know about the ct she had last year  Phone Note Call from Patient Call back at Home Phone [phone]   Caller: Patient Call For: young Summary of Call: patient phoned stated that we referred her to an ENT and the ENT was reading the records and they stated that she had a CT scan last November but she does not remember having a CT and she wants to know where she had it what she had to do for it. She can be reached at [phone]  Initial call taken by: Vedia Coffer,  January 07, 2011 10:48 AM  Follow-up for Phone Call        called and spoke with pt.  pt states she went to see Dr. Jearld Fenton and he had told her she had a CT sinuses last Nov 2011.  Pt states she doesn't remember having that done before.  Informed pt that per our records, CY had ordered Ct sinuses last Nov to be done at [facility]  Explained to pt what a CT is and how it's done.  Pt then remembered having the scan done.  nothing further was needed.  Aundra Millet Reynolds LPN  January 07, 2011 11:59 AM

## 2011-01-13 ENCOUNTER — Encounter: Payer: Self-pay | Admitting: Internal Medicine

## 2011-01-18 ENCOUNTER — Ambulatory Visit (INDEPENDENT_AMBULATORY_CARE_PROVIDER_SITE_OTHER): Payer: Medicare Other

## 2011-01-19 ENCOUNTER — Encounter: Payer: Self-pay | Admitting: Internal Medicine

## 2011-01-19 DIAGNOSIS — J301 Allergic rhinitis due to pollen: Secondary | ICD-10-CM

## 2011-01-20 ENCOUNTER — Ambulatory Visit (INDEPENDENT_AMBULATORY_CARE_PROVIDER_SITE_OTHER): Payer: Medicare Other | Admitting: Internal Medicine

## 2011-01-20 ENCOUNTER — Encounter: Payer: Self-pay | Admitting: Internal Medicine

## 2011-01-20 ENCOUNTER — Telehealth: Payer: Self-pay | Admitting: Internal Medicine

## 2011-01-20 DIAGNOSIS — J301 Allergic rhinitis due to pollen: Secondary | ICD-10-CM

## 2011-01-20 DIAGNOSIS — J45909 Unspecified asthma, uncomplicated: Secondary | ICD-10-CM

## 2011-01-20 DIAGNOSIS — J309 Allergic rhinitis, unspecified: Secondary | ICD-10-CM

## 2011-01-21 NOTE — Consult Note (Signed)
Summary: [facility]  [facility]   Imported By: Lennie Odor 01/12/2011 14:14:38  _____________________________________________________________________  External Attachment:    Type:   Image     Comment:   External Document

## 2011-01-21 NOTE — Assessment & Plan Note (Signed)
Summary: ALLERGY/CB  Nurse Visit   Allergies: 1)  ! Penicillin 2)  ! Morphine 3)  ! * Omnicep 4)  ! * Clarithromycin 5)  ! Levaquin 6)  ! Naprosyn 7)  ! * Tequin 8)  ! Codeine  Orders Added: 1)  Allergy Injection (1) [16109]

## 2011-01-26 NOTE — Assessment & Plan Note (Signed)
Summary: allergy/cb  Nurse Visit   Allergies: 1)  ! Penicillin 2)  ! Morphine 3)  ! * Omnicep 4)  ! * Clarithromycin 5)  ! Levaquin 6)  ! Naprosyn 7)  ! * Tequin 8)  ! Codeine  Orders Added: 1)  Allergy Injection (1) [27253]

## 2011-01-26 NOTE — Progress Notes (Signed)
Summary: Med Inquiries  Phone Note Call from Patient Call back at Home Phone [phone]   Caller: Patient Summary of Call: Pt states that she called 2-3 weeks ago regarding if she should restart Fosamax. Pt also states that we were to inform her when we received medical records to let her know when she had last bone density test w/ Dr Lynelle Doctor and if she is due. Pt has questions from her dermatologist to ask about her alopecia and lab tests for Levoxyl. Pt states she is concerned about these matters but does not want to make appt & have to pay co-pay to speak w/MD for answers. Pt states that she will be in the building today for allergy re-testing w/Dr Maple Hudson and may drop in to our office to speak w/someone. Initial call taken by: Burnard Leigh [facility]),  January 20, 2011 10:54 AM  Follow-up for Phone Call        pt was instructed to hold her fosamax until she further discussed sticking sensation during swalllow with her GI providers - her dexa 03/14/2009 showed improved bone density with tx but pt should not take the med if it causes problems swallowing - no change in my recs, cont to hold until discussion with GI - if ok with GI, would resume fosamax; plan repeat dexa 03/2011 (2 yr f/u) - thyroid labs normal 10/2010 - i previously left message for patient on phone tree re: same - no change rec. Follow-up by: Newt Lukes MD,  January 21, 2011 6:07 AM  Additional Follow-up for Phone Call Additional follow up Details #1::        left message on machine for pt to return my call. Margaret Pyle, CMA  January 21, 2011 1:07 PM  Pt advised of above and will call previous GI(Dr Marina Goodell) for eval for swallowing. Additional Follow-up by: Margaret Pyle, CMA,  January 21, 2011 2:18 PM

## 2011-01-26 NOTE — Assessment & Plan Note (Signed)
Summary: allergy skin test and 3week ROV   Vital Signs:  Patient profile:   70 year old female Height:      65.5 inches Weight:      141 pounds BMI:     23.19 O2 Sat:      99 % on Room air Pulse rate:   76 / minute BP sitting:   116 / 74  (right arm) Cuff size:   regular  Vitals Entered By: Reynaldo Minium CMA (January 20, 2011 2:58 PM)  O2 Flow:  Room air CC: Allergy skin testing   Primary Provider/Referring Provider:  Rene Paci, MD  CC:  Allergy skin testing.  History of Present Illness:  December 29, 2010- COPD, Allergic Rhinitis, Rhinosiusitis, CAD/ Aortic Stenosis Nurse-CC: Acute visit-cough still-productive at times-green in color. Took Bactim till face got flushed and she thought it might be allergy to sulfa. Then took cipro.  She has gotten more hoarse, with runny nose and frontal headache, still some cough, malaise. Says she never runs fever, can't ever hear to know if her ears are stopped up. Appetite poor. Coughs after eating,  but says never reflux or heartburn. Wants to vomit but can't- as part of cough till she feels need to retch. Finally will get up a small green mucus plug. Feels sore through mid chest.  January 20, 2011- COPD, Allergic Rhinitis, Rhinosiusitis, CAD/ Aortic Stenosis Nurse-CC: Allergy skin testing ENT consult reviewed from 01/06/11- chronic sinusitis and GERD-Dr Jearld Fenton, no polyp. She feels well today, except that she notices watery rhinorhea and some thick white nasal discharge.  Skin test- Positives grasses, trees, dust, cat (no cat).     Preventive Screening-Counseling & Management  Alcohol-Tobacco     Alcohol drinks/day: 0     Alcohol Counseling: not indicated; patient does not drink     Smoking Status: never     Tobacco Counseling: not indicated; no tobacco use  Current Medications (verified): 1)  Metoprolol Tartrate 25 Mg Tabs (Metoprolol Tartrate) .... Take 1 By Mouth Two Times A Day 2)  Crestor 10 Mg  Tabs (Rosuvastatin Calcium)  .... Take 1 Tablet By Mouth Once A Day 3)  Adult Aspirin Low Strength 81 Mg  Tbdp (Aspirin) .Marland Kitchen.. 1 Daily 4)  Zolpidem Tartrate 10 Mg  Tabs (Zolpidem Tartrate) .... Take 1/2 Tab By Mouth At Bedtime Id [id number] 5)  Caltrate 600 1500 Mg  Tabs (Calcium Carbonate) .... As Directed 6)  Senna-Plus 8.6-50 Mg  Tabs (Sennosides-Docusate Sodium) .... Take 1 Tablet By Mouth Once A Day 7)  Multivitamins   Tabs (Multiple Vitamin) .... Take 1 Tablet By Mouth Once A Day 8)  Fosamax 70 Mg Tabs (Alendronate Sodium) .... Once A Week - Hold Until Further Notice 9)  Coq10 50 Mg/2.5gm Emul (Coenzyme Q10) .... Once A Day 10)  Levothyroxine Sodium 75 Mcg Tabs (Levothyroxine Sodium) .... Take 1 Tablet By Mouth Once A Day 11)  Allergy Vaccine 1:10 Gh .... Restart At 0.1 and Build, After Lapse. 12)  Mobic 15 Mg Tabs (Meloxicam) .... 1/4 of The Tablet Daily 13)  Protonix 40 Mg Tbec (Pantoprazole Sodium) .Marland Kitchen.. 1 By Mouth Once Daily  Allergies (verified): 1)  ! Penicillin 2)  ! Morphine 3)  ! * Omnicep 4)  ! * Clarithromycin 5)  ! Levaquin 6)  ! Naprosyn 7)  ! * Tequin 8)  ! Codeine  Past History:  Past Surgical History: Last updated: 11/11/2010 Breast-Mastectomy bilateral with reconstruction 1992 CABG 5 vessel 1997 Appendectomy 1958  right ankle arthroscopy Hysterectomy 1990 Tonsillectomy 1959  Family History: Last updated: 03/27/2008 Family History of Colon Cancer:her sister Mother with breast cancer and lung disease. Father with emphysema. Siblings with coronary artery disease.  Social History: Last updated: 11/11/2010 Patient never smoked.  Divorced x 3, now single lives alone in [place], teaches Sunday school,  retired Airline pilot, school psychologist no alcohol.  Risk Factors: Alcohol Use: 0 (01/20/2011) Exercise: yes (11/11/2010)  Risk Factors: Smoking Status: never (01/20/2011)  Past Medical History: DIVERTICULITIS OF COLON GERD BREAST CANCER, hx 1991 AORTIC STENOSIS,  mild-moderate DVT hx CORONARY ARTERY DISEASE s/p multivessel CABG 2007 ASTHMA ALLERGIC RHINITIS                Allergy skin test- 01/20/11- Positive common inhalants RHINOSINUSITIS, RECURRENT  Hyperlipidemia Osteoporosis  MD roster: card - [facility] - [facility] allg/pulm -Clear[facility] derm - dan j[facility] onc  ([facility])  Hypothyroidism  Review of Systems      See HPI       The patient complains of shortness of breath with activity, nasal congestion/difficulty breathing through nose, and anxiety.  The patient denies shortness of breath at rest, productive cough, non-productive cough, coughing up blood, chest pain, irregular heartbeats, acid heartburn, indigestion, loss of appetite, weight change, abdominal pain, difficulty swallowing, sore throat, tooth/dental problems, headaches, sneezing, ear ache, rash, change in color of mucus, and fever.    Physical Exam  Additional Exam:  General: A/Ox3; pleasant and cooperative, NAD, clearly feeling better and more upbeat today SKIN: no rash, lesions. Looks pale NODES: no lymphadenopathy HEENT: [place]/AT, EOM- WNL, Conjuctivae- clear, PERRLA, TM-clear. , Nose- blowing nose, Throat- normal without drainage or exudate,  Mallampati  II, hoarse strained vocal quality NECK: Supple w/ fair ROM, JVD- none, normal carotid impulses w/o bruits Thyroid- normal to palpation CHEST: coarse breath soundst,  no cough, no wheeze or dullness. ,  HEART: RRR,1- 2/6 SEM- AS ABDOMEN: Soft and nl;  ZOX:WRUE, nl pulses, no edema  NEURO: Grossly intact to observation      Impression & Recommendations:  Problem # 1:  ALLERGIC RHINITIS DUE TO POLLEN (ICD-477.0)  Discussed results and gave options. There has been some drift with increased tree and other differences compared with her current vaccine. She will continue present mix for now, trying meds for symptoms rather than restarting and rebuilding vaccine,  and watch season. I  suggested loratadine for sneeze and drip when needed.     Orders: Est. Patient Level III (45409)  Problem # 2:  RHINOSINUSITIS, RECURRENT (ICD-473.9) We discussed comparative symptoms. I don't believe she has a bacterial sinus infection now, and want to be sparing of antibiotics.   Problem # 3:  ASTHMA (ICD-493.90) Recurrent asthma/ asthmatic bronchitis is under better control now, after difficult and stressful winter.   Medications Added to Medication List This Visit: 1)  Loratadine 10 Mg Tabs (Loratadine) .Marland Kitchen.. 1 daily  Other Orders: Allergy Puncture Test (81191) Allergy I.D Test (47829)  Patient Instructions: 1)  Please schedule a follow-up appointment in 2 months. 2)  Continue current allergy vaccine for now. We will watch this as the seasons go on. 3)  Suggest a HEPA air filter from someplace like [employer] or [employer], to put in your bedroom, 4)  Try loratadine 10 mg otc antihistamine from drug store as an antihistamine   Orders Added: 1)  Est. Patient Level III [56213] 2)  Allergy Puncture Test [95004] 3)  Allergy I.D Test [08657]

## 2011-01-27 ENCOUNTER — Encounter: Payer: Self-pay | Admitting: Internal Medicine

## 2011-01-27 ENCOUNTER — Ambulatory Visit (INDEPENDENT_AMBULATORY_CARE_PROVIDER_SITE_OTHER): Payer: Medicare Other

## 2011-01-27 DIAGNOSIS — J301 Allergic rhinitis due to pollen: Secondary | ICD-10-CM

## 2011-02-02 NOTE — Assessment & Plan Note (Signed)
Summary: allergy/cb  Nurse Visit   Allergies: 1)  ! Penicillin 2)  ! Morphine 3)  ! * Omnicep 4)  ! * Clarithromycin 5)  ! Levaquin 6)  ! Naprosyn 7)  ! * Tequin 8)  ! Codeine  Orders Added: 1)  Allergy Injection (1) [95115] 

## 2011-02-04 ENCOUNTER — Ambulatory Visit (INDEPENDENT_AMBULATORY_CARE_PROVIDER_SITE_OTHER): Payer: Medicare Other

## 2011-02-04 DIAGNOSIS — J301 Allergic rhinitis due to pollen: Secondary | ICD-10-CM

## 2011-02-12 ENCOUNTER — Ambulatory Visit (INDEPENDENT_AMBULATORY_CARE_PROVIDER_SITE_OTHER): Payer: Medicare Other

## 2011-02-12 DIAGNOSIS — J301 Allergic rhinitis due to pollen: Secondary | ICD-10-CM

## 2011-02-17 ENCOUNTER — Ambulatory Visit (INDEPENDENT_AMBULATORY_CARE_PROVIDER_SITE_OTHER): Payer: Medicare Other

## 2011-02-17 DIAGNOSIS — J301 Allergic rhinitis due to pollen: Secondary | ICD-10-CM

## 2011-02-22 ENCOUNTER — Ambulatory Visit (INDEPENDENT_AMBULATORY_CARE_PROVIDER_SITE_OTHER): Payer: Medicare Other

## 2011-02-22 DIAGNOSIS — J301 Allergic rhinitis due to pollen: Secondary | ICD-10-CM

## 2011-03-01 ENCOUNTER — Ambulatory Visit (AMBULATORY_SURGERY_CENTER): Payer: Medicare Other | Admitting: *Deleted

## 2011-03-01 ENCOUNTER — Ambulatory Visit (INDEPENDENT_AMBULATORY_CARE_PROVIDER_SITE_OTHER): Payer: Medicare Other

## 2011-03-01 VITALS — Ht 66.0 in | Wt 142.0 lb

## 2011-03-01 DIAGNOSIS — Z800 Family history of malignant neoplasm of digestive organs: Secondary | ICD-10-CM

## 2011-03-01 DIAGNOSIS — J309 Allergic rhinitis, unspecified: Secondary | ICD-10-CM

## 2011-03-01 MED ORDER — PEG-KCL-NACL-NASULF-NA ASC-C 100 G PO SOLR: ORAL | Status: DC

## 2011-03-09 ENCOUNTER — Ambulatory Visit (INDEPENDENT_AMBULATORY_CARE_PROVIDER_SITE_OTHER): Payer: Medicare Other

## 2011-03-09 ENCOUNTER — Telehealth: Payer: Self-pay | Admitting: *Deleted

## 2011-03-09 DIAGNOSIS — J309 Allergic rhinitis, unspecified: Secondary | ICD-10-CM

## 2011-03-09 NOTE — Telephone Encounter (Signed)
I am trying to find the actual allergy skin test sheet from 01/20/11 - where are we putting these in Epic/

## 2011-03-12 NOTE — Telephone Encounter (Signed)
I spoke with Johnny Bridge at [employer] [facility] scanning station; she took the patients information and will look into this and call back with answer.

## 2011-03-15 ENCOUNTER — Ambulatory Visit (AMBULATORY_SURGERY_CENTER): Payer: Medicare Other | Admitting: Internal Medicine

## 2011-03-15 ENCOUNTER — Encounter: Payer: Self-pay | Admitting: Internal Medicine

## 2011-03-15 VITALS — BP 157/74 | HR 61 | Temp 98.5°F | Resp 18 | Ht 66.0 in | Wt 139.0 lb

## 2011-03-15 DIAGNOSIS — K5730 Diverticulosis of large intestine without perforation or abscess without bleeding: Secondary | ICD-10-CM

## 2011-03-15 DIAGNOSIS — K6389 Other specified diseases of intestine: Secondary | ICD-10-CM

## 2011-03-15 DIAGNOSIS — Z800 Family history of malignant neoplasm of digestive organs: Secondary | ICD-10-CM

## 2011-03-15 DIAGNOSIS — Z1211 Encounter for screening for malignant neoplasm of colon: Secondary | ICD-10-CM

## 2011-03-15 MED ORDER — SODIUM CHLORIDE 0.9 % IV SOLN: 500.0000 mL | INTRAVENOUS | Status: DC

## 2011-03-15 NOTE — Patient Instructions (Signed)
Follow discharge instructions.  Continue your current medications.

## 2011-03-15 NOTE — Progress Notes (Signed)
Pt requests rectal tube after procedure. Pt states "I will be here all day if I do not get the rectal tube." No sticks in left arm, pt has history of breast cancer.

## 2011-03-16 ENCOUNTER — Telehealth: Payer: Self-pay

## 2011-03-16 ENCOUNTER — Ambulatory Visit (INDEPENDENT_AMBULATORY_CARE_PROVIDER_SITE_OTHER): Payer: Medicare Other

## 2011-03-16 DIAGNOSIS — J309 Allergic rhinitis, unspecified: Secondary | ICD-10-CM

## 2011-03-16 NOTE — Telephone Encounter (Signed)

## 2011-03-22 ENCOUNTER — Ambulatory Visit (INDEPENDENT_AMBULATORY_CARE_PROVIDER_SITE_OTHER): Payer: Medicare Other

## 2011-03-22 DIAGNOSIS — J309 Allergic rhinitis, unspecified: Secondary | ICD-10-CM

## 2011-03-30 NOTE — Assessment & Plan Note (Signed)
[facility]                            CARDIOLOGY OFFICE NOTE   [name], [name]                        MRN:          [id number]  DATE:07/26/2007                            DOB:          Jun 02, 1941    [name] was seen in followup at the [facility] Cardiology Office on  July 26, 2007.  [name] is a delightful 70 year old woman with  coronary artery disease and coronary artery bypass surgery in November  2007 after she presented with a non-ST-segment-elevation MI.  [name]  continues to do well from a symptomatic standpoint.  She has remained  active.  She is doing regular walking.  She walks for about 45 minutes  daily without symptoms.  She specifically denies chest pain, dyspnea,  orthopnea, or PND.  She has not had edema.  She has continued to do well  on pravastatin from a standpoint of medication tolerance.  She was  unable to tolerate Lipitor or Crestor due to myalgias in the neck and  shoulders.  Otherwise, she has no complaints today.   CURRENT MEDICATIONS:  1. Pravastatin 40 mg at bedtime.  2. Mobic 7.5 mg daily.  3. Fish oil 3 g daily.  4. Lopressor 25 mg twice daily.  5. Fosamax 70 mg weekly.  6. Levoxyl 50 mcg daily.  7. Nexium 40 mg daily.  8. Aspirin 81 mg daily.  9. Ambien 5 mg at bedtime as needed.  10.Senna as needed.   ALLERGIES:  PENICILLIN, MORPHINE, OMNICEF.   EXAM:  She is alert and oriented in no acute distress.  Weight 139 pounds, blood pressure 120/80, heart rate 76, respiratory  rate 16.  HEENT:  Normal.  NECK:  Normal carotid upstrokes with a right carotid bruit.  LUNGS:  Clear to auscultation bilaterally.  CARDIOVASCULAR:  The heart is regular rate and rhythm with a 2/6 harsh  systolic murmur at the right upper sternal border.  ABDOMEN:  Soft and nontender.  No organomegaly.  EXTREMITIES:  No cyanosis, clubbing, or edema.  Peripheral pulses are 2+  and equal throughout.   ASSESSMENT:  1. Coronary  artery disease status post coronary artery bypass surgery.      [name] is doing well from a symptomatic standpoint.  Will      continue antiplatelet therapy with aspirin and secondary risk      reduction with Lopressor and pravastatin.  I am going to check an      exercise Myoview in a few months, which will make her 1 year out      from her bypass.  She had multiple areas of disease, some of which      were unable to be bypassed due to the diffuse nature of the disease      and technical reasons at the time of her surgery.  2. Aortic stenosis.  Her aortic stenosis was mild with a mean      transaortic valve gradient of 12 mmHg and a valve area of      approximately 1.8 square cm back in November of last year.  We will      repeat an echo for stability, but she probably will not require      ongoing serial studies, unless she has a change in her symptoms in      the future.  3. Dyslipidemia.  Her lipids were recently checked on pravastatin, and      showed a total cholesterol of 162 with an HDL of 39 and an LDL of      109.  I would really like to be aggressive with her lipid lowering      if possible, and I do not think she is going to achieve anywhere      near her goal LDL of 70 on pravastatin.  I am going to give her      another trial of Crestor 10 mg daily to be taken in conjunction      with coenzyme Q-10 to see if this alleviates her myalgias.  Will      followup lipids and LFTs in 12 weeks.  4. Followup.  I would like to see [name] back in 4 months.  We will      contact her after the results of her echocardiogram and nuclear      study are available.     [name]. [name], MD  Electronically Signed    MDC/MedQ  DD: 07/26/2007  DT: 07/27/2007  Job #: [id number]   cc:   Lavonda Jumbo, M.D.

## 2011-03-30 NOTE — Assessment & Plan Note (Signed)
[facility]                             PULMONARY OFFICE NOTE   DONNICE, NIELSEN                        MRN:          [id number]  DATE:03/16/2007                            DOB:          [birth year]    PROBLEM:  1. Allergic rhinitis.  2. Asthma.  3. Bronchitis.  4. Coronary disease/bypass.  5. History of right leg thrombophlebitis.  6. Aortic stenosis.   HISTORY:  She was seen in April by the nurse practitioner, who gave  [facility], but she developed rash.  She was then switched to Z-Pak, which  did not help.  She continues allergy vaccine here at 1:10.  Having  persistent head and chest congestion with no fever, thick white clear  mucus, feels wheezy, some sore throat.  She avoids stimulant  decongestants because of her heart condition.   MEDICATIONS:  1. Lopressor 25 mg b.i.d.  2. Fosamax 70 mg.  3. Lipitor 40 mg.  4. Levoxyl 50 mcg.  5. Nexium 40 mg.  6. Aspirin 81 mg.  7. Citalopram 40 mg.  8. Ambien 10 mg at bedtime.  9. Caltrate.  10.Occasional Tussionex.   DRUG INTOLERANT TO PENICILLIN, MORPHINE AND OMNICEF.   OBJECTIVE:  Weight 134 pounds, blood pressure 110/62, pulse 59, room air  saturation 99%.  Cough, but no wheeze, nasal stuffiness.  Grade 1/6 systolic murmur at the aortic space.  No edema.   IMPRESSION:  Exacerbation of rhinitis and asthmatic bronchitis probably  due to pollen allergy.   PLAN:  1. Continue allergy vaccine at 1:10.  2. Try adding Singulair 10 mg.  3. Xopenex neb treatment 1.25 mg.  4. Depo-Medrol 80 mg IM today.  5. If she does well she will come back to see me in a year, sooner      p.r.n.     Clinton D. Maple Hudson, MD, Tonny Bollman, FACP  Electronically Signed    CDY/MedQ  DD: 03/16/2007  DT: 03/17/2007  Job #: [id number]

## 2011-03-30 NOTE — Assessment & Plan Note (Signed)
[facility]                            CARDIOLOGY OFFICE NOTE   Angelica, Mullen                        MRN:          [id number]  DATE:05/31/2008                            DOB:          Aug 11, 1941    [name] was seen in followup with the [facility] Cardiology office May 31, 2008.  She is a 70 year old woman with diffuse coronary artery  disease who underwent coronary artery bypass surgery in November 2007.  Her initial presentation was a non-ST-elevation MI.  She continues to do  very well from a cardiac standpoint.  She denies chest pain or dyspnea.  She walks 30 minutes daily without symptoms.  She has no orthopnea, PND,  or edema.  She was at a waterpark with her grandson and fractured a rib  after falling down on water slide.  She has had pain with inspiration  along the left chest wall since this occurred, but is slowly improving.   Current medications include; Crestor 10 mg at bedtime, Coenzyme Q10 50  mg daily, metoprolol tartrate 25 mg twice daily, Fosamax 70 mg weekly,  Levoxyl 50 mcg daily, Protonix 40 mg daily, aspirin 81 mg daily, senna  daily, Ambien one-quarter pill at bedtime, Caltrate one daily, and  multivitamin daily.   ALLERGIES:  PENICILLIN, MORPHINE and OMNICEF.   PHYSICAL EXAMINATION:  GENERAL:  On exam, she is alert and oriented in  no acute distress.  Weight is 144 pounds, blood pressure 122/68, heart  rate 66, and respiratory rate 16.  HEENT:  Normal.  NECK:  Normal carotid upstrokes with bilateral carotid bruits, right  greater than the left.  Jugular venous pressure is normal.  LUNGS:  Clear bilaterally.  HEART:  Regular rate and rhythm with a grade 3/6 crescendo-decrescendo  murmur at the right upper sternal border.  A2 component is preserved.  No diastolic murmur or gallop present.  ABDOMEN:  Soft, nontender.  No organomegaly.  EXTREMITIES:  No clubbing, cyanosis, or edema.  Peripheral pulses 2+ and  equal.   EKG shows sinus rhythm with borderline left atrial enlargement.  I could  not rule out anterior infarct, age indeterminate.   ASSESSMENT:  1. Coronary artery disease status post coronary artery bypass graft.      The patient remains stable with no angina.  Exercise Myoview stress      study from November 2008 showed no ischemia.  Left ventricular      function was hyperdynamic.  Continue current medical therapy      without changes.  2. Mild aortic stenosis.  Aortic stenosis by exam, seems at least      moderate, but her gradients are minimal by both catheterization and      echocardiogram.  Continue observation.  3. Dyslipidemia.  Lipids were just checked and show normal LFTs with a      total cholesterol of 136, triglycerides 60, HDL 44, and LDL 80.  4. Bilateral carotid bruits.  Carotid ultrasound done during her      hospitalization in 2007 showed no significant internal carotid  artery stenoses.  We will check follow up carotid ultrasound to      assess for change.   For followup, I would like to see [name] back in 6 months.  I will be  happy to see her sooner if any problems arise.     [name]. [name], MD  Electronically Signed    MDC/MedQ  DD: 05/31/2008  DT: 06/01/2008  Job #: [id number]

## 2011-03-30 NOTE — Assessment & Plan Note (Signed)
[facility]                            CARDIOLOGY OFFICE NOTE   LYLIAN, SANAGUSTIN                        MRN:          [id number]  DATE:11/20/2007                            DOB:          04/11/1941    Savanah Bayles was seen in follow-up at the [facility] Cardiology office on  November 20, 2007.  Ms. Rini is a 70 year old woman with multivessel  coronary artery disease, who underwent CABG in November 2007 after  presenting with a non-ST-elevation MI.  She continues to do well at  present.  She exercises regularly at her church.  Her main form of  exercise is walking.  Since changing her statin to Crestor and  initiating the use of coenzyme Q10, her myalgias have completely  resolved.  She has no exertional symptoms.  She specifically denies  chest pain, dyspnea, orthopnea, PND, palpitations, edema,  lightheadedness or syncope.  She continues to have some difficulty with  sleep and currently is taking a quarter of a 10 mg Ambien.  She is  trying to stop Ambien altogether.   CURRENT MEDICATIONS:  1. Mobic 7.5 mg daily.  2. Crestor 10 mg at bedtime.  3. Coenzyme Q10 50 mg daily.  4. Metoprolol 25 mg twice daily.  5. Fosamax 70 mg weekly.  6. Levoxyl 50 mcg daily.  7. Pantoprazole 40 mg daily.  8. Aspirin 81 mg daily.  9. Senna daily.  10.Ambien 2.5 mg at bedtime.  11.Caltrate and a multivitamin.   ALLERGIES:  PENICILLIN, MORPHINE, OMNICEF.   On examination Ms. Gaulin is alert and oriented.  She is a very pleasant  woman in no acute distress.  Weight 145, blood pressure 130/74, heart  rate 64, respiratory rate 16.  HEENT:  Normal.  NECK:  Normal carotid upstrokes with soft bilateral carotid bruits.  LUNGS:  Clear bilaterally.  HEART:  Regular rate and rhythm, with a 2/6 early peaking crescendo-  decrescendo murmur along the left sternal border.  There are no  diastolic murmurs or gallops.  ABDOMEN:  Soft, nontender.  No organomegaly.  EXTREMITIES:   No clubbing, cyanosis or edema.  Peripheral pulses are 2+  and equal throughout.   EKG:  Demonstrates normal sinus rhythm, cannot exclude anterior infarct;  age indeterminate.   Lipids from November 13, 2007 show:  Cholesterol 138, triglycerides 65,  HDL 43, LDL 82.  LFTs were within normal limits.   ASSESSMENT:  1. Coronary artery disease status post coronary artery bypass      grafting.  Ms. Releford is currently stable without evidence of angina.      She had an exercise Myoview in November 2008, that showed no      evidence of ischemia or infarction.  To continue current medical      therapy, which includes aspirin, metoprolol and Crestor.  2. Dyslipidemia.  I am pleased with her lipid panel.  She has had a      great deal of difficulty with various statins, and I am not      inclined to try to increase her Crestor at this point.  Her LDL is      very near goal, and I am satisfied with the improvement in her      lipid panel.   FOLLOW-UP:  I would like to see Ms. Klopf back in 6 months.     [name]. [name], MD  Electronically Signed    MDC/MedQ  DD: 11/21/2007  DT: 11/21/2007  Job #: [id number]   cc:   Lavonda Jumbo, M.D.

## 2011-03-30 NOTE — Letter (Signed)
November 12, 2008    To Whom It May Concern:   RE:  JAMAIRA, SHERK  MRN:  [id number]  /  DOB:  1941/05/06   This letter is to acknowledge that Ms. Fujii is a patient under my  medical care.  She was last seen for an acute visit on October 29, 2008.  She has been actively ill with a respiratory infection and was  medically unstable and we felt inappropriate to be exposed to crowds for  planned air travel.   We appreciate your help with medically necessary release from her  planned travel.    Sincerely,      Clinton D. Maple Hudson, MD, Tonny Bollman, FACP  Electronically Signed    CDY/MedQ  DD: 11/14/2008  DT: 11/15/2008  Job #: [id number]

## 2011-03-30 NOTE — Assessment & Plan Note (Signed)
[facility]                            CARDIOLOGY OFFICE NOTE   [name]                        MRN:          [id number]  DATE:04/27/2007                            DOB:          August 05, 1941    [name] was seen in outpatient followup at the [facility] Cardiology  Office on April 27, 2007.  She is a 70 year old woman who has coronary  artery disease, and underwent bypass surgery in November 2007.  She had  severe diffuse 3-vessel coronary artery disease, and underwent 5-vessel  bypass with a LIMA to OM1, sequential saphenous graft to the mid LAD and  1st diagonal, and sequential saphenous vein graft to the PDA and  posterolateral branches of the right coronary artery.  She had  difficulty postoperatively for the first few months, but is doing much  better at present.  She has participated faithfully in cardiac rehab.  She has tried to exercise, but has several aches and pains that limit  her.  At the time of her last visit in February, I discontinued her  Lipitor because she was having a great deal of muscle pains.  She says  some of these are still present, but they are improved on Pravachol.  She has had less pain in the neck and shoulders.  She denies any chest  pain, dyspnea, orthopnea, PND, edema, palpitations, lightheadedness, or  syncope.  She is doing well tolerating her current medications.   MEDICATIONS:  Include:  1. Lopressor 25 mg b.i.d.  2. Fosamax 70 mg weekly.  3. Pravachol 40 mg daily.  4. Levoxyl 50 mcg daily.  5. Nexium 40 mg daily.  6. Aspirin 81 mg daily.  7. Celexa 40 mg daily.  8. Ambien 10 mg at bedtime.   ALLERGIES:  1. PENICILLIN.  2. MORPHINE.  3. OMNICEF.   PHYSICAL EXAMINATION:  The patient is alert and oriented.  She is in no  acute distress.  Her weight is 132 pounds.  Blood pressure is 120/70.  Heart rate 61.  Respiratory rate 12.  HEENT:  Normal.  NECK:  Normal carotid upstrokes without bruits.   Jugular venous pressure  is normal.  No thyromegaly or thyroid nodules.  LUNGS:  Clear to auscultation bilaterally.  HEART:  Regular rate and rhythm with a 2/6 harsh ejection murmur at the  right upper sternal border.  There is a preserved A2 component of the  2nd heart sound.  No diastolic murmurs or gallops are present.  ABDOMEN:  Soft and non-tender.  No organomegaly.  No abdominal bruits.  EXTREMITIES:  No clubbing, cyanosis, or edema.  Peripheral pulses are 2+  and equal throughout.   EKG shows normal sinus rhythm with possible age-indeterminate anterior  MI, and borderline left atrial enlargement.  There are no significant ST  segment or T wave changes.   ASSESSMENT:  [name] is currently stable from a cardiovascular  standpoint.  Her cardiac problems are as follows:  1. Coronary artery disease, status post multivessel bypass.  She      should continue on her current medical therapy,  which includes      statin therapy, aspirin, and beta blocker.  She has ideal blood      pressure, and is having no angina.  2. Mild aortic stenosis.  Her murmur and symptoms are unchanged.  Will      plan on repeat echo this fall.  3. Dyslipidemia.  Her statin was changed as detailed above to      Pravachol.  We will recheck lipids and LFTs today.  4. Followup.  I will see [name] back in 3 months, or sooner if any      new cardiac problems arise.     [name]. [name], MD  Electronically Signed    MDC/MedQ  DD: 04/27/2007  DT: 04/27/2007  Job #: [id number]   cc:   Lavonda Jumbo, M.D.

## 2011-03-30 NOTE — Assessment & Plan Note (Signed)
[facility]                            CARDIOLOGY OFFICE NOTE   Angelica, Mullen                        MRN:          [id number]  DATE:11/28/2008                            DOB:          1941-03-16    REASON FOR VISIT:  Coronary artery disease status post coronary bypass  surgery.   HISTORY OF PRESENT ILLNESS:  [name] is a 70 year old woman with  diffuse multivessel coronary artery disease who underwent coronary  bypass surgery 2 years ago.  She presented with a non-ST-elevation MI  and underwent surgery during her index hospitalization.  She has had no  further cardiac events.  She has had episodes of recurrent chest pain in  November 2008, and underwent exercise perfusion imaging that  demonstrated no ischemia.  Recently, she has been doing quite well.  She  denies any cardiac symptoms.  She specifically denies chest pain,  dyspnea, orthopnea, PND, or edema.  She is walking 30 minutes daily.  Her only complaint at today's visit is leg pain.  She describes  nighttime pain in her lower legs.  She has to get up from bed and walk  and this makes her pain feel better.  She describes the pain is both a  numbness and crampy feeling.  She has had no skin changes and she denies  any injury.   MEDICATIONS:  1. Crestor 10 mg at bedtime.  2. Coenzyme Q10 50 mg daily.  3. Metoprolol 25 mg b.i.d.  4. Fosamax 70 mg weekly.  5. Levoxyl 50 mcg daily.  6. Pantoprazole 40 mg daily.  7. Aspirin 81 mg daily.  8. Senna daily.  9. Ambien 5 mg at bedtime.  10.Caltrate 600 mg 2 daily.  11.Multivitamin 1 daily.   ALLERGIES:  PENICILLIN, MORPHINE, and OMNICEF.   PHYSICAL EXAMINATION:  GENERAL:  The patient is alert and oriented in no  acute distress.  VITAL SIGNS:  Weight is 150 pounds, blood pressure 128/66, heart rate  67, respiratory rate 12.  HEENT:  Normal.  NECK:  Normal carotid upstrokes with bilateral bruits.  JVP is normal.  LUNGS:  Clear.  HEART:   Regular rate and rhythm with a 2/6 systolic ejection murmur best  heard at the left sternal border.  No diastolic murmurs or gallops.  ABDOMEN:  Soft, nontender, no bruits.  EXTREMITIES:  Femoral pulses are 2+.  Dorsalis pedis and posterior  tibial pulses are 2+ and equal.  SKIN:  Warm and dry without rash.  There is no clubbing, cyanosis or  edema.   ASSESSMENT:  1. Coronary artery disease status post coronary artery bypass      grafting.  The patient remains stable with no evidence of angina.      Myoview stress scan approximately 1 year ago with no ischemia.  EKG      today shows normal sinus rhythm with no ST changes.  Continue      current medical program, which includes antiplatelet therapy with      aspirin, beta-blocker, and treatment of her dyslipidemia.  2. Dyslipidemia.  Lipid panel from May 29, 2008, showed a total      cholesterol of 136, triglycerides 60, HDL 44, LDL 80.  She      continues to tolerate Crestor and is at goal.  Repeat lipids and      LFTs.  3. Bilateral carotid bruits.  Carotid duplex was done June 12, 2009,      and showed no significant carotid stenosis.   For followup, I would like to see [name] in 6 months.     [name]. [name], MD  Electronically Signed    MDC/MedQ  DD: 11/28/2008  DT: 11/29/2008  Job #: [id number]   cc:   Lavonda Jumbo, M.D.

## 2011-03-30 NOTE — Assessment & Plan Note (Signed)
[facility]                                 ON-CALL NOTE   DALAYZA, ZAMBRANA                        MRN:          [id number]  DATE:12/31/2007                            DOB:          12/01/1940    PRIMARY CARDIOLOGIST:  Dr. Tonny Bollman.   I received a phone call from Mrs. Molesky stating that she did not have any  more of her metoprolol 25 mg that she takes twice a day.  She stated  that our office did fax the prescription to [facility] but apparently they  were unable to send it in time for her to have her doses prior to  running out.  She is requesting a refill for a week's time until her  medication comes in the mail.  I have called in metoprolol 25 mg #14 to  [facility] on [address] at [phone] to assist her until her medication  comes in.      Bettey Mare. Lyman Bishop, NP  Electronically Signed      Jonelle Sidle, MD  Electronically Signed   KML/MedQ  DD: 12/31/2007  DT: 01/01/2008  Job #: [id number]

## 2011-04-02 ENCOUNTER — Telehealth: Payer: Self-pay | Admitting: *Deleted

## 2011-04-02 NOTE — Assessment & Plan Note (Signed)
[facility]                             PULMONARY OFFICE NOTE   ROOSEVELT, BISHER                        MRN:          [id number]  DATE:03/06/2007                            DOB:          [birth year]    HISTORY OF PRESENT ILLNESS:  Patient is a 70 year old white female  patient of Dr. Maple Hudson who has a known history of asthma and allergic  rhinitis.  Presents today for an acute office visit.  Patient complains  over the last week she has had increased nasal congestion, sinus pain  and pressure, and thick yellow nasal discharge with a dry cough.  Patient denies any hemoptysis, orthopnea, PND, or leg swelling.  Patient  does report she recently underwent a coronary artery bypass graft in  November 2007, and has been slowly recovering at home.   PAST MEDICAL HISTORY:  Reviewed.   CURRENT MEDICATIONS:  Reviewed.   PHYSICAL EXAMINATION:  Patient is a pleasant female, in no acute  distress.  She is afebrile with stable vital signs.  Her 02 saturation is 98% on  room air.  HEENT:  Nasal mucosa is erythematous with some mild turbinate edema.  Maxillary sinus tenderness.  Posterior pharynx is clear.  NECK:  Supple without cervical adenopathy.  No JVD.  LUNG SOUNDS:  Clear.  CARDIAC:  Regular rate.  ABDOMEN:  Soft and non-tender.  EXTREMITIES:  Warm without any edema.   IMPRESSION AND PLAN:  Acute rhinosinusitis.  Patient to begin Omnicef  x10 days.  Patient does have a penicillin allergy, which she complains  her allergy is nausea.  Patient reports that she is okay to take  Omnicef.  Mucinex DM twice daily.  Patient is to use saline nasal spray  p.r.n.  Patient will return back with Dr. Maple Hudson as scheduled, or sooner  if needed.      Rubye Oaks, NP  Electronically Signed      Neta Mends. Panosh, MD  Electronically Signed   TP/MedQ  DD: 03/08/2007  DT: 03/08/2007  Job #: [id number]

## 2011-04-02 NOTE — Assessment & Plan Note (Signed)
[facility]                         GASTROENTEROLOGY OFFICE NOTE   [name], [name]                        MRN:          [id number]  DATE:11/01/2006                            DOB:          08/07/1941    HISTORY:  [name] is a 70 year old female with a history of  hypertension, hyperlipidemia, and coronary artery disease for which she  underwent coronary artery bypass grafting September 26, 2006.  She has  other general medical problems as listed below.  She has been followed  in this office for intermittent problems with reflux, chronic  constipation, and screening colonoscopy due to a family history of colon  cancer.  Her most recent colonoscopy was performed in March 2007.  This  was normal except for sigmoid diverticulosis.  Her last office endoscopy  was performed in 1993 and revealed mild reflux.  She was seen in the  hospital by my partner, Dr. Leone Payor on October 16, 2006 after being  admitted to the hospital with nausea, belching and anorexia.  She was  also having vomiting and weakness.  Laboratory and x-ray studies were  unrevealing.  Upper endoscopy was performed October 17, 2006.  This  revealed only mild esophagitis as confirmed by biopsies.  Minimal  gastritis with negative testing for Helicobacter pylori.  She was  continued on her proton pump inhibitor and metoclopramide.  As time has  gone on, she has slowly improved.  She continues to complain of burping.  She has a multitude of questions, many of which are no necessarily GI  related, which she asks and have been answered to her satisfaction.  Currently she denies heartburn, dysphagia, abdominal pain or problem  with her bowels beyond her baseline.  No bleeding.  In terms of her  burping symptoms, symptomatic therapy such as TUMS, Gas-x and proton  pump inhibitors have not been particularly helpful.  She does however  report improvement in symptoms when she takes Xanax.   ALLERGIES:  PENICILLIN and MORPHINE.   CURRENT MEDICATIONS:  Fosamax, Lipitor, Levoxyl, metoclopramide, Nexium,  aspirin, Lexapro, Ambien CR, Senna plus laxative, Xanax, TUMS, and Gas-  X.   PHYSICAL EXAMINATION:  Well-appearing, but obviously anxious female in  no acute distress.  Blood pressure is 124/72, heart rate 74 and regular.  Weight is 134.6  pounds.  HEENT:  Sclerae anicteric.  Conjunctiva are pink.  Oral mucosa intact.  There is no evidence of thrush.  No adenopathy.  LUNGS:  Are clear.  HEART:  Is regular.  ABDOMEN:  Soft without tenderness, mass or hernia.  No succussions/good  bowel sounds heard.   IMPRESSION:  Problems with fatigue, anorexia, weakness and nausea, all  secondary to recent surgery.  Chronic  problems with belching likely due  to aerophagia from anxiety.  She does have reflux. However, an absence  of typical symptoms on proton pump inhibitor therapy.  She is anxious.   RECOMMENDATIONS:  1. Continue proton pump inhibitor therapy.  2. Expect improvement of malaise and anorexia in time.  3. Resume general medical care with Dr. Lynelle Doctor.     [name].  Marina Goodell, MD  Electronically Signed    JNP/MedQ  DD: 11/01/2006  DT: 11/02/2006  Job #: [id number]   cc:   Salvatore Decent. Dorris Fetch, M.D.  Lavonda Jumbo, M.D.

## 2011-04-02 NOTE — Cardiovascular Report (Signed)
[name], [name]:  [id number]   MEDICAL RECORD NO.:  [id number]          PATIENT TYPE:  INP   LOCATION:  2905                         FACILITY:  [facility]   PHYSICIAN:  [name]. [name], MD  DATE OF BIRTH:  1940-12-14   DATE OF PROCEDURE:  07/24/2006  DATE OF DISCHARGE:                              CARDIAC CATHETERIZATION   PROCEDURE:  1. Left heart catheterization.  2. Selective coronary angiography.  3. Left ventricular angiography.  4. Left subclavian angiography.  5. Right femoral AngioSeal.   INDICATIONS:  [name] is a very nice 70 year old woman who has no prior  history of coronary artery disease.  She presented with very typical  sounding ischemic symptoms over the previous 4 days with exertion.  She also  developed resting angina during her hospitalization.  She was referred for  cardiac catheterization in the setting of her classic symptoms of myocardial  ischemia.   PROCEDURAL DETAILS:  Risks and indications of the procedure were explained  in detail to the patient.  Informed consent was obtained.  The right groin  was prepped, draped and anesthetized with 1% lidocaine under normal sterile  conditions.  Using the modified Seldinger technique a 6-French arterial  sheath was placed in the right femoral artery.  Multiple angiographic views  about the left and right coronary arteries were taken.  For the left  coronary artery a 6-French JL4 catheter was used, for the right coronary  artery a 6-French JR4 catheter was used.  Following selective coronary  angiography, an angled pigtail catheter was inserted into the left  ventricle.  A straight wire was kneaded across the aortic valve.  Left  ventricular pressures were recorded.  A 30-degree right anterior oblique  left ventriculogram was performed.  A pullback across the aortic valve was  done.  The JR4 catheter was used to image the left subclavian artery and non-  selectively image the left  internal mammary artery to assess its adequacy  for a vascular conduit for potential surgery.  At the conclusion of the  case, a 6-French AngioSeal was used to seal the right femoral arteriotomy.   FINDINGS:  Aortic pressure 144/67 with a mean of 98.  Left ventricular  pressure 145/4 with a mid diastolic pressure of 16.  There was no  significant aortic stenosis.   Left main stem is angiographically normal.  It is mildly calcified.  It  bifurcates into the LAD and left circumflex.  The LAD is a severely diseased  vessel in multiple areas.  It courses down to the mid/distal anterior wall  but does not reach the left ventricular apex.  It gives off a proximal first  diagonal branch that has severe diffuse disease.  The LAD in its proximal  segment has an 80% focal stenosis.  At its mid portion, the LAD bifurcates  into multiple branches.  All of the branches of the LAD have significant  disease in the range of 70-80%.  The LAD is small caliber through the mid  and distal portions.   The left circumflex is a large  caliber vessel.  It is heavily calcified.  It  gives off a large first obtuse marginal branch.  Proximal circumflex has a  calcified 80% stenosis. The true AV circumflex is small diameter.  The  marginal branch gives off multiple branches throughout its course.  The  obtuse marginal branch has serial 70% lesions.  A few of the branches from  the obtuse marginal have significant ostial disease as well.   The right coronary artery is dominant.  Proximal right coronary artery has a  50% stenosis.  It is mildly calcified.  The mid right coronary artery has a  very highgrade 95% stenosis.  An RV marginal branch courses out of this  region.  The distal right coronary artery has nonobstructive plaque and then  the vessel bifurcates into the PDA and posterior AV segment.  The PDA is  subtotally occluded and fills late.  It also partly fills from left-sided  collaterals.  Posterior AV  segment gives off 2 posterior lateral branches  the first of which has a 70% stenosis, the second posterior lateral branch  has no significant disease.   The left subclavian artery was imaged and there is calcified plaque at the  ostium.  It is not hemodynamically significant.  There is no pressure  gradient from the left subclavian to aortic arch pullback.  The left  internal mammary artery is widely patent.   Left ventriculogram demonstrates normal left ventricular function with a  left ventricular ejection fraction of 65%.   ASSESSMENT:  1. Severe three-vessel coronary artery disease.  2. Patent left subclavian artery and left internal mammary artery.  3. Normal left ventricular function.   PLAN:  [name] has severe triple-vessel coronary artery disease with  diffuse disease of all of her major epicardial coronaries.  She does not  have anatomy that is amendable to percutaneous coronary intervention.  I  have asked for a CVT as consult for possible coronary bypass surgery.  We  will continue her on her nitroglycerin drip and restart heparin 6 hours  after her sheath was pulled.      [name]. [name], MD  Electronically Signed     MDC/MEDQ  D:  09/23/2006  T:  09/24/2006  Job:  [id number]   cc:   Deirdre Peer. Polite, M.D.  Lavonda Jumbo, M.D.

## 2011-04-02 NOTE — Letter (Signed)
March 01, 2007    Daven Pinckney  [address]  [place], [place] [address]   RE:  CALIA, NAPP  MRN:  [id number]  /  DOB:  April 23, 1941   Dear Ms. Kirk Ruths:   This note is regarding Linsay Vogt. Ms. Mcdade is a 70 year old woman who  I follow regularly for her cardiac care. She was last seen as an  outpatient at the [facility] cardiology office on January 12, 2007. Ms.  Kinker has a history of coronary artery disease and underwent coronary  bypass surgery in November 2007. She has requested a letter to clear her  for the rehabilitation program at the [facility]. Ms. Ventura is  progressing very nicely after her bypass surgery and I think that it is  fine for her to begin participation in the rehab program. She does not  have any restrictions at this point.   If you have any questions, please feel free to call my office at any  time. The office phone number is [phone].    Sincerely,      [name]. [name], MD  Electronically Signed    MDC/MedQ  DD: 03/01/2007  DT: 03/02/2007  Job #: [id number]

## 2011-04-02 NOTE — Assessment & Plan Note (Signed)
[facility]                            CARDIOLOGY OFFICE NOTE   [name], [name]                        MRN:          [id number]  DATE:01/12/2007                            DOB:          [birth year]    [name] returns for followup as an outpatient at the [facility] on January 12, 2007. She is a 70 year old woman with  coronary artery disease, status post coronary bypass surgery in November  of 2007. She had a great deal of difficulty post-operatively with  depression and failure to thrive. She is doing much better now. She  continues to work with cardiac rehab and is maintaining a good activity  level. She does not have any exertional symptoms. She complains of some  muscle pain in her neck and shoulder area and wonders whether this could  be secondary to Lipitor. She did not have this pain prior to starting  this medication. She also complains of an area in her right lower leg  that is tender in a very focal spot. Her third complaint is that of pain  around her right breast implant. That implant is leaking and she is  scheduled to see a plastic surgeon for this problem. She has no other  specific complaints at this time. She has recently started a new  antidepressant that Dr. Lynelle Doctor has prescribed. She denies dyspnea,  orthopnea, PND, edema or exertional chest pain.   CURRENT MEDICATIONS:  1. Lopressor 25 mg b.i.d.  2. Fosamax 35 mg weekly.  3. Lipitor 20 mg daily.  4. Levoxyl 50 mcg daily.  5. Nexium 40 mg daily.  6. Aspirin 81 mg daily.  7. Senna plus.  8. Citalopram 40 mg daily.  9. Ambien 10 mg at bedtime.   ALLERGIES:  PENICILLIN AND MORPHINE.   PHYSICAL EXAMINATION:  [name] is alert and oriented. She is in no  acute distress. Her affect is bright. Weight is 134 pounds. Blood  pressure 111/74, heart rate is 68, respiratory rate is 12.  HEENT: Is normal.  NECK: Normal carotid upstrokes with a soft left carotid  bruit.  Jugular  venous pressure is normal.  LUNGS:  Clear to auscultation bilaterally.  CARDIOVASCULAR:  The heart is regular rate and rhythm with a 2/6 harsh  ejection murmur with a preserved A2 component.  ABDOMEN: Soft and nontender. No organomegaly.  EXTREMITIES: No clubbing, cyanosis or edema. Peripheral pulses are 2+  and equal throughout. The right lower leg demonstrates a tender firm  area along the medial calf at the site of a superficial vein.   ASSESSMENT:  [name] is currently stable from a cardiovascular  standpoint. Her cardiac problems are as follows:  1. Coronary artery disease status post coronary artery bypass graft.      The patient had diffuse three vessel coronary artery disease with a      lot of distal vessel involvement. She needs to be maintained on      aggressive therapy. She is likely having myalgias secondary to      Lipitor and I will give her  a trial of Pravachol 40 mg daily to see      if her symptoms are better with this. Statin therapy is going to be      critical as to her longterm outcome. Otherwise, she is doing very      well from a cardiac standpoint and would recommend continuing her      aspirin and beta-blocker doses with no changes at this time.  2. Right leg thrombophlebitis. I advised her to try warm compresses      twice daily to the right leg. If she has persistent problems we can      check an ultrasound, but I am confident this is a superficial      thrombophlebitis.  3. Heart murmur. She has an aortic valve murmur and is likely due to      aortic sclerosis. At the time of her catheterization back in      September of last year, she had no gradient. She will require echo      followup in approximately one year from her last assessment, which      was in November of 2007. That study suggested mild aortic stenosis      with a valve area of 1.8 square cm.   For followup, I will plan on seeing [name] back in four months. Will  check  lipids and LFTs prior to that appointment. If she has problems in  the interim, she was advised to call.     [name]. [name], MD  Electronically Signed    MDC/MedQ  DD: 01/12/2007  DT: 01/12/2007  Job #: [id number]   cc:   Lavonda Jumbo, M.D.

## 2011-04-02 NOTE — Op Note (Signed)
[name], BEVARD NO.:  [id number]   MEDICAL RECORD NO.:  [id number]          PATIENT TYPE:  AMB   LOCATION:  DSC                          FACILITY:  [facility]   PHYSICIAN:  Leonides Grills, M.D.     DATE OF BIRTH:  [birth year]   DATE OF PROCEDURE:  DATE OF DISCHARGE:                                 OPERATIVE REPORT   PREOPERATIVE DIAGNOSES:  1.  Right ankle impingement.  2.  Right peroneal tendinitis.  3.  Right calcaneal spur.  4.  Right sural nerve neuritis.   POSTOPERATIVE DIAGNOSES:  1.  Right ankle impingement.  2.  Right peroneal tendinitis.  3.  Right calcaneal spur.  4.  Right sural nerve neuritis.   OPERATION:  1.  Right ankle arthroscopy with extensive debridement.  2.  Right peroneal tenolysis/tenosynovectomy.  3.  Right calcaneal spur excision, i.e., peroneal and tubercle debridement.  4.  Right sural nerve neurolysis.   ANESTHESIA:  General with ankle block.   SURGEON:  Leonides Grills, M.D.   ASSISTANT:  None.   COMPLICATIONS:  None.   DISPOSITION:  Taken to the PR.   INDICATIONS FOR PROCEDURE:  This patient is a 70 year old female who has had  longstanding lateral ankle pain, anterolateral ankle pain, that was  interfering with her life __________.  She was consented for the above  procedure.  All risks, which include infection, neurovascular injury,  persistent pain, worsening pain, prolonged recovery, stiffness, arthritis,  were all explained.  Questions were encouraged and answered preoperatively.  The patient was brought to the operating room and placed in the supine  position.  After adequate general endotracheal tube anesthesia was  administered as well as Ancef 1 gram IV piggyback, the right lower extremity  was then prepped and draped in a sterile manner over a proximally placed  thigh tourniquet.  After the patient was placed in the sloppy lateral  position operative side up on a beanbag.  All bony prominences were well-  padded.  Once the right lower extremity was prepped and draped in a sterile  manner, the anatomic landmarks including anterior tibialis tendon, peroneus  tertius were mapped out; superficial peroneal nerve could not be seen.  A  spinal needle was then placed just medial to the anterior tibialis tendon  and 20 mL normal saline was instilled in the ankle.  Weston Brass and spread  technique was utilized to create the anterior medial portal just medial to  the anterior tibialis tendon.  Blunt-tipped trocar with cannula followed by  camera was then placed in the ankle and under direct visualization  illuminating the skin from inside out.  An anterolateral port was created  lateral to the peroneus tertius tendon using a spinal needle followed by  nick and spread technique.  Interestingly, there was a large band of tissue  on the anterolateral aspect of the ankle that extended medially.  There was  a tremendous amount of synovitis over the gutter superiorly as well.  The  accessory tib/fib ligament was rubbing the anterolateral corner of the talar  dome raw and there  was a large amount of synovitis on either anterior and  posterior aspects of the ligament itself.  The synovitis extended within the  syndesmotic region between the articulation between the tibial plafond and  the lateral malleolus intra-articularly and with range of motion of the  ankle was impinging this area as well.  Extensive debridement was performed  of not only the inflammatory tissue between the lateral aspect of the tibial  plafond and the lateral malleolus, i.e., israelii lesion, as well as  excision of the accessory tib/fib ligament and local synovitis around this  area as well.  This was done with the aid of a bevel as well as a shaver.  A  band of tissue was also debrided as well.  The anterolateral corner of the  talar dome had an abrasion that was down to bone.  When dorsiflexed fully,  this did not articulate with the joint  and no debridement of the joint was  performed.  The lateral gutter was also competently debrided and there were  no impinging areas with full range of motion of the ankle.  We took pictures  throughout this procedure.  We then placed the camera anterolaterally and  visualized anteromedially.  We debrided the remaining portion of the band of  tissue medially and there was a large amount of synovitis anteromedially as  well.  This was debrided again extensively with a shaver as well as a bevel.  Once this was completed, the range of motion of the ankle was checked and  there were no impinging areas.  There were no obvious osteochondral lesions.  The remaining cartilage looked pristine.  Pictures were obtained throughout  the procedure.  The camera was removed.  The limb was gravity exsanguinated.  The tourniquet was elevated to 290 mmHg.  A longitudinal incision over the  peroneal tubercle was then made.  Dissection was carried out through the  skin.  Hemostasis was obtained.  Sural nerve was identified immediately and  was crossing directly over the peroneal tendons and peroneal tubercle.  This  was inflamed and beef-red as well.  A sural nerve neurolysis was then  performed formally when the wound was extended both proximally and distally.  With the nerve completely mobilized and free of any retraction, the sheath  of the peroneus longus and brevis respectively were opened.  The brevis had  a small amount of inflammation and a formal tenolysis was performed with the  tenosynovectomy.  The longus sheath had actually a nodule distal to the  inferior peroneal retinaculum and once this was removed, the area was  debrided.  There was no distinct tear that was full-thickness and needed  repair.  However, there was a nodule within the tendon distally.  Once this  was completely decompressed and a large amount of tenosynovium was excised as well, ranged the ankle and there was no impingement of  this lesion.  The  peroneal tubercle of the calcaneus was then partially excised as well,  leaving a small ridge for proper excursion in division between the peroneus  longus and brevis tendons and there was no exposed bone rubbing the tendons  as well.  This was completely released and had excellent free flow range of  motion.  The area was copiously irrigated with normal saline.  Tourniquet  deflated.  Hemostasis was obtained.  Subcutaneous was closed with 3-0  Vicryl, skin was closed with 4-0 nylon over all wounds.  Sterile dressing  was applied.  Cam walker  boot was applied.  The patient was stable and taken  to the PR.      Leonides Grills, M.D.  Electronically Signed     PB/MEDQ  D:  05/31/2006  T:  06/01/2006  Job:  [id number]

## 2011-04-02 NOTE — H&P (Signed)
NAMEDORREEN, [name]:  [id number]   MEDICAL RECORD NO.:  [id number]          PATIENT TYPE:  INP   LOCATION:  3731                         FACILITY:  [facility]   PHYSICIAN:  Jackie Plum, M.D.DATE OF BIRTH:  1941-05-11   DATE OF ADMISSION:  09/22/2006  DATE OF DISCHARGE:                                HISTORY & PHYSICAL   HISTORY OF PRESENT ILLNESS:  The patient is a 70 year old lady who presents  with the chief complaint of chest pain which has been intermittent for the  last one week of cardiac onset with worsening.  The pain is in the  substernal area, described as heaviness, moderate in intensity at its peak.  The chest pain is worsened by exertion with shortness of breath.  Has no  pain associated fever or cough or nausea or vomiting, syncope or pre-  syncopal episode.  She denies any PND or orthopnea.   PAST MEDICAL HISTORY:  History of hypertension, breast cancer, dyslipidemia  and gastroesophageal reflux disease.   FAMILY HISTORY:  Positive for heart disease.   ALLERGIES:  THE PATIENT IS ALLERGIC TO MORPHINE AND PENICILLIN.   CURRENT MEDICATIONS:  Include Fosamax and Mobic.   SOCIAL HISTORY:  The patient does not smoke cigarette.   REVIEW OF SYSTEMS:  As noted above, otherwise unremarkable.   PHYSICAL EXAMINATION:  VITAL SIGNS:  BP is 140/76, pulse is 69, and  respirations are 20.  GENERAL:  The patient is in no acute cardiopulmonary distress after she  received some nitroglycerin with some improvement in her pain.  HEENT:  Pupils equal, round and reactive to light.  Extraocular movements  are intact.  Oropharynx is moist.  NECK:  Supple.  No JVD.  LUNGS:  Clear to auscultation.  CARDIAC:  Regular rate and rhythm.  No gallops or murmurs.  ABDOMEN:  Soft and nontender.  Bowel sounds present.  EXTREMITIES:  No cyanosis.   DATA REVIEWED:  EKG shows sinus tachycardia at 104 beats per minute without  any acute ST-T wave changes.   X-ray of  the chest did not show any acute infiltrate.   The patient's CBC was essentially within normal limits except for a mildly  elevated platelet count of 389.  Sodium is 140, potassium is 3.3, chloride  is 108, glucose is 104, BUN is 17 and creatinine is 0.8.  Review of point of  care cardiac markers - negative for acute MI.   CURRENT IMPRESSION AND PLAN:  Chest pain, rule out myocardial  infarction/ischemia.  The patient is admitted to a telemetry bed.  Will  monitor her rhythm and also check serial cardiac enzymes.  If she rules out  and is pain-free, will schedule her for a stress test.      Jackie Plum, M.D.  Electronically Signed     GO/MEDQ  D:  09/23/2006  T:  09/23/2006  Job:  [id number]

## 2011-04-02 NOTE — Op Note (Signed)
[name], ROARTY NO.:  [id number]   MEDICAL RECORD NO.:  [id number]          PATIENT TYPE:  INP   LOCATION:  2301                         FACILITY:  [facility]   PHYSICIAN:  Salvatore Decent. Dorris Fetch, M.D.DATE OF BIRTH:  [birth year]   DATE OF PROCEDURE:  09/22/2006  DATE OF DISCHARGE:                                 OPERATIVE REPORT   PREOPERATIVE DIAGNOSIS:  Three-vessel coronary disease.   POSTOPERATIVE DIAGNOSIS:  Three-vessel coronary disease.   PROCEDURE:  Median sternotomy, extracorporeal circulation, coronary artery  bypass grafting x5 (left internal mammary artery to obtuse marginal 1,  sequential saphenous vein graft to mid LAD and first diagonal, sequential  saphenous vein graft to posterior descending and posterior lateral,  endoscopic vein harvest right leg.   SURGEON:  Salvatore Decent. Dorris Fetch, M.D.   ASSISTANT:  Coral Ceo, P.A.   ANESTHESIA:  General.   FINDINGS:  OM1 fair quality target.  Remaining targets diffusely diseased  and poor quality, vein graft fair quality, mammary good quality.   CLINICAL NOTE:  [name] is a 70 year old female who presents with new-onset  unstable angina. At catheterization she had severe three-vessel disease with  diffusely diseased vessels that were targets.  She was advised to undergo  coronary bypass grafting with obvious limitations discussed with her that  she was at increased risk for early and late graft failure, but it was best  treatment alternative in her case. The indications, risks, benefits and  alternatives were discussed in detail with the patient.  She understood and  accepted risks and agreed to proceed.   OPERATIVE NOTE:  Ms. [name] was brought to the preop holding area on September 26, 2006.  There lines were placed to monitor arterial, central venous, and  pulmonary arterial pressure.  ECG leads were placed for continuous  telemetry.  Intravenous antibiotics were administered.  The patient was  noted prior to administration of antibiotics to have an erythematous rash.  Benadryl and steroids were administered.  She was taken to the operating  room, anesthetized and intubated.  A Foley catheter was placed. Chest,  abdomen and legs were prepped and draped in usual fashion.   A median sternotomy was performed and the left internal mammary artery was  harvested using standard technique.  There was a good-quality conduit.  Simultaneously the saphenous vein was harvested from the right leg from the  mid calf to the groin.  It was a fair quality. Five thousand units of  heparin was administered during the vessel harvest. Remaining full heparin  dose was administered prior to opening the pericardium.   The pericardium was opened.  The ascending aorta was inspected and there was  no evidence of atherosclerotic disease.  The aorta was cannulated via  concentric 2-0 Ethibond pledgeted pursestring sutures.  A dual stage venous  cannula placed via pursestring suture in the right atrial appendage.  Cardiopulmonary bypass was instituted and the patient was cooled to 32  degrees Celsius.  The coronary arteries were inspected and anastomotic sites  were chosen.  The conduits were inspected and cut to length.  A foam pad was  placed in the pericardium to protect the left phrenic nerve.  A temperature  probe was placed in myocardial septum.  A cardioplegic cannula placed in the  ascending aorta. It was elected to use the left internal mammary artery to  graft the obtuse marginal II target as this was by far the best quality  coronary. The mid LAD was deeply intramyocardial. The distal LAD was not  graftable.   The aorta was crossclamped, left ventricle was emptied via aortic root vent.  Cardiac arrest was achieved with a combination of cold antegrade blood  cardioplegia and topical iced saline.  After achieving a complete diastolic  arrest and adequate myocardial septal cooling the following  distal  anastomoses were performed.   First a reversed saphenous vein graft was placed sequentially to the LAD and  first diagonal.  The LAD was grafted in its midportion.  This was deeply  intramyocardial.  It was a 1.5 mm vessel at the site anastomosis.  There was  severe disease distally.  There were two large septal perforating branches  coming off the segment that was grafted.  A side-to-side anastomosis was  performed with a running 7-0 Prolene suture.  All anastomoses were probed  proximally and distally to ensure patency at their completion. The distal  end was cut to length and was anastomosed end-to-side to the first diagonal.  This was a 1 mm poor quality target. This anastomosis also was performed  with running 7-0 Prolene suture.  There was good flow through the graft.  Cardioplegia was administered and there was good hemostasis at anastomoses.   Next reverse saphenous vein graft was placed sequentially to the posterior  descending and posterolateral branches of the right coronary. Posterior  descending was subtotally occluded.  It was 1 mm vessel at the site  anastomosis and was of poor quality and had severe disease distally.  A side-  to-side anastomosis with running 7-0 Prolene was performed and end-to-side  anastomosis then was performed to the posterior lateral which was also  accepted only a 1-mm probe.  This anastomosis also was performed with  running 7-0 Prolene suture.  Cardioplegia was administered and there was  adequate flow through the graft.   Next the left internal mammary artery was brought through a window in the  pericardium.  The distal end was beveled and was anastomosed end-to-side to  obtuse marginal II. This was the dominant lateral branch of the left  circumflex and was by far the best target vessel.  It did accept a 1.5-mm  probe and was heavily diseased proximally and was a fair-quality distally. The mammary was anastomosed end-to-side with a  running 8-0 Prolene suture.  At the completion anastomosis bulldog clamp was briefly removed to inspect  for hemostasis.  The bulldog clamp was replaced and mammary pedicle was  tacked to epicardial surface of the heart with 6-0 Prolene sutures.   The vein grafts then were cut to length.  The cardioplegic cannulas removed  from the ascending aorta.  The proximal vein graft anastomoses were  performed to 4.0 mm punch aortotomies with running 6-0 Prolene sutures. At  the completion of the final proximal anastomoses the patient was placed in  Trendelenburg position.  De-airing maneuvers were performed.  Lidocaine was  administered.  The bulldog clamp was again removed from the left mammary  artery. After de-airing the aortic root, the aortic crossclamp was removed.  The total crossclamp time was 96 minutes.  While the patient was being rewarmed all proximal and distal anastomoses  were inspected for hemostasis.  The patient required a single defibrillation  with 20 joules and then was in heart block.  Epicardial pacing wires were  placed on the right ventricle and right atrium and DDD pacing was initiated.  When the patient had rewarmed to a core temperature of 37 degrees Celsius,  she weaned from cardiopulmonary bypass without difficulty.  Total bypass  time was 132 minutes.   Dr. Hart Robinsons had performed transesophageal echocardiography both  pre and postop which showed some IHSS like physiology with hypertrophied  septum.  The aortic valve leaflets were very mildly sclerotic but there were  no significant limitation to leaflet opening. Left ventricular function was  preserved.   The initial cardiac index after weaning from bypass was greater than 2  liters per minute per meter squared.  The patient remained hemodynamically  stable throughout post bypass period.  The test dose of protamine was  administered and was well tolerated.  The atrial and aortic cannulae were  removed.   The remaining protamine was administered without incident.  The  chest irrigated with 1 liter of warm normal saline containing 1 gram of  vancomycin.  Hemostasis was achieved. The pericardium was reapproximated  over the ascending aorta but not the heart itself. Left pleural and two  mediastinal chest tubes were placed through separate subcostal incisions.  The sternum was closed with interrupted heavy gauge stainless steel wires.  Pectoralis fascia, subcutaneous tissue and skin were closed in standard  fashion.  Subcuticular closure used for the skin incisions.  All sponge,  needle and instrument counts were correct at the end of the procedure.  There were no intraoperative complications and the patient was taken from  the operating room to the surgical intensive care unit in critical but  stable condition.           ______________________________  Salvatore Decent Dorris Fetch, M.D.    SCH/MEDQ  D:  09/26/2006  T:  09/27/2006  Job:  [id number]   cc:   [name]. [name], MD  Devoria Albe, M.D.

## 2011-04-02 NOTE — Discharge Summary (Signed)
[name], [name]:  [id number]   MEDICAL RECORD NO.:  [id number]          PATIENT TYPE:  INP   LOCATION:  2009                         FACILITY:  [facility]   PHYSICIAN:  Salvatore Decent. Dorris Fetch, M.D.DATE OF BIRTH:  [birth year]   DATE OF ADMISSION:  09/22/2006  DATE OF DISCHARGE:  10/04/2006                                 DISCHARGE SUMMARY   PRIMARY ADMITTING DIAGNOSES:  Chest pain.   ADDITIONAL/DISCHARGE DIAGNOSES:  1. Severe three-vessel coronary artery disease.  2. Hypertension.  3. Dyslipidemia.  4. Gastroesophageal reflux.  5. Osteoarthritis.  6. Osteoporosis.  7. History of breast cancer status post bilateral mastectomy.  8. Seasonal allergic rhinitis.  9. History of sinusitis.  10.Postoperative blood loss anemia.   PROCEDURES PERFORMED:  1. Cardiac catheterization.  2. Coronary artery bypass grafting x5 (left internal mammary artery to the      obtuse marginal 1, sequential saphenous vein graft to the mid-LAD and      first diagonal, sequential saphenous vein graft the posterior      descending and posterolateral).  3. Endoscopic vein harvest, right leg.   HISTORY:  The patient is a 70 year old female with no previous known  coronary history.  Over the weeks preceding this admission, she had been  noting significant reflux-type symptoms with substernal chest discomfort  associated with burping.  Last week, while on vacation with her family, she  had an episode of substernal chest heaviness, which radiated to her left arm  and was associated with shortness of breath.  When she returned home, she  presented for further evaluation.  She was seen initially by internal  medicine service and was admitted for further cardiac workup.   HOSPITAL COURSE:  Patient ruled out for myocardial infarction.  She was seen  in consultation by cardiology and underwent cardiac catheterization on  September 23, 2006.  She was found to have a normal left ventricular  function  with an ejection fraction of 65%.  She was also found to have severe three-  vessel coronary artery disease which was not felt to be a minimal to  percutaneous intervention.  Because of these findings, she underwent a  cardio-thoracic surgery consultation.  The patient was seen by Dr. Charlett Lango, and he reviewed her films as well and felt that her best course  of action would be to proceed with surgical revascularization.  He explained  the risks, benefits and alternatives of the procedure to the patient and her  family, and she agreed to proceed with surgery.  She did have another  episode of chest discomfort while in the hospital and was treated with  nitroglycerin, aspirin and Heparin.  Her troponin bumped slightly, but her  other enzymes remained stable.  Otherwise, she remained stable during her  preoperative workup.  This included carotid Doppler studies which showed no  evidence of ICA stenosis and normal ABIs bilaterally.  She was taken to the  operating room on September 26, 2006 and underwent CABG x5 as described in  detail above, performed by Dr. Dorris Fetch.  She tolerated the procedure  well and was transferred to the SICU in stable condition.  She was able to  be extubated shortly after surgery.  She was hemodynamically stable and  doing well on post-op day one.  She remained in the ICU secondary to some  deconditioning and was started on beta blocker therapy, as well as diuresis.  She was noted to have a mild blood loss anemia and was treated  conservatively with iron supplementation.  By post-op day 2, she was ready  for transfer to the floor.  Overall, she has done well postoperatively.  She  has maintained normal sinus rhythm, has remained afebrile, and her vital  signs have been stable.  Her incisions are all healing well.  She has been  treated with aggressive toilet measure and presently has been weaned from  supplemental oxygen.  She has been diuresed  back down to her preoperative  weight and does not appear clinically volume overloaded on physical exam.   LABORATORY:  Her most recent labs on October 03, 2006 showed a hemoglobin  of 10.7, hematocrit 32.1, platelets 562, white count 9.5, sodium 136,  potassium 4.2, BUN 9, creatinine 0.7.   Her most significant postoperative difficulty has been some deconditioning,  and she also has a lack of family assistance at home, and because of these  issues, it was felt that a short-term placement post-discharge was  indicated.  A clinical social worker has seen the patient, and plans have  been made for her to transfer to an assisted living facility.  She has been  seen and evaluated and is currently medically stable and ready for transfer  once a bed is available.   DISCHARGE MEDICATIONS:  1. Enteric-coated aspirin 325 mg daily.  2. Lopressor 50 mg b.i.d.  3. Lipitor 20 mg q.d.  4. Nu-Iron 150 mg q.d.  5. Folic acid 1 mg q.d.  6. Fosamax weekly.  7. Calcium Plus Vitamin D daily.  8. FiberCon daily.  9. Ambien 10 mg p.r.n. as taken at home.  10.Levoxyl 50 mcg daily.  11.Ultram 50-100 mg q.4-6 h. p.r.n. for pain.   DISCHARGE INSTRUCTIONS:  She is asked to refrain from driving, heavy lifting  or strenuous activity.  She may continue ambulating daily and using her  incentive spirometer.  She may shower daily and clean her incisions with  soap and water.  She may continue a low-fat, low-sodium diet.   DISCHARGE FOLLOWUP:  She will need to see Dr. [name], her cardiologist back  in 2 weeks in the [facility] office will arrange this appointment,  as well as chest x-ray.  She will then see Dr. Dorris Fetch in 3 weeks, and  this will be arranged by the [facility] office.  In the interim, if she  experiences any problems or has questions, she is to contact our office  immediately.      Coral Ceo, P.A.    ______________________________  Salvatore Decent Dorris Fetch, M.D.   GC/MEDQ  D:   10/04/2006  T:  10/04/2006  Job:  [id number]   cc:   Lavonda Jumbo, M.D.  [name]. [name], MD

## 2011-04-02 NOTE — H&P (Signed)
NAMEBREA, [name]   MEDICAL RECORD NO.:  [id number]          PATIENT TYPE:  INP   LOCATION:  3740                         FACILITY:  [facility]   PHYSICIAN:  Bettey Mare. [place], NPDATE OF BIRTH:  04/08/1941   DATE OF ADMISSION:  10/15/2006  DATE OF DISCHARGE:                              HISTORY & PHYSICAL   PRIMARY CARDIOLOGIST:  Tonny Bollman, MD.   ADMITTING CARDIOLOGIST:  Duke Salvia, MD, [facility].   PRIMARY CARE PHYSICIAN:  Lavonda Jumbo, M.D.   CARDIOVASCULAR SURGEON:  Salvatore Decent. Dorris Fetch, M.D.   PROBLEM LIST:  1. Status post coronary artery bypass grafting on September 26, 2006.      LIMA to obtuse marginal-1, sequential saphenous vein graft to mid-      LAD and first diagonal, sequential saphenous vein graft to      posterior descending posterolateral.  2. Severe three-vessel coronary artery disease.  A.  Status post      cardiac catheterization per Dr. Tonny Bollman on July 24, 2006      revealing left main angiographically normal, LAD severely diseased      vessel in multiple areas, courses down the mid-distal anterior wall      but does not reach the left ventricular apex.  It gives off to      proximal first diagonal branch with severe diffuse disease. LAD in      its proximal segment had an 80% focal stenosis.  At its mid-portion      LAD bifurcates in multiple branches.  All of the branches of the      LAD had significant disease in the range of 70-80%.  The left      circumflex is heavily calcified.  The proximal circumflex was 80%      stenosis.  True AV circumflex is a small diameter.  The marginal      branch gives off multiple branches throughout its course.  The      obtuse marginal has a 70% lesion.  Right coronary artery is      dominant.  Proximal right coronary artery has 50% stenosis, mildly      calcified.  Mid-right coronary artery had very high grade 95%      stenosis.  RV marginal branch courses out of  this region.  The      distal right coronary artery had nonobstructive plaque and then      subtotally occluded infiltrate.  Left subclavian artery was imaged.      There was calcified plaque at the ostium, not hemodynamically      significant.  3. Hypertension.  4. Dyslipidemia.  5. GERD.  6. Osteoarthritis.  7. Osteoporosis.  8. History of breast cancer status post bilateral mastectomy.  9. Seasonal allergic rhinitis.  10.History of sinusitis.  11.Postoperative blood loss anemia.   HISTORY OF PRESENT ILLNESS:  This is a 70 year old, Caucasian female  status post coronary artery bypass grafting approximately 10 days ago  with above-mentioned diagnoses who has been having one week of nausea  and vomiting on and off  with anorexia.  The patient has had severe  weakness and fatigue since having had surgery.  The patient was  discharged by cardiovascular surgery on October 04, 2006, and since  that time the patient says she has been feeling weak and tired.  She did  spend approximately one week in assisted living and then has been home  for three days.  She has also been complaining of fevers and chills.  She has not been taking her medicine consistently secondary to chronic  nausea.  The patient has had two episodes of vomiting over the last 24  hours.  She has also been complaining of some substernal chest  discomfort and burning.  She was seen by her cardiovascular surgeon, Dr.  Dorris Fetch, two days ago and was given a prescription for Nexium, but  she has not taken it.  The patient was seen by her home health nurse  today and home health nurse noted that the patient's heart rate was in  the 120s.  Blood pressure was normal.  The patient appeared pale and  tired and she did call our service for any additional suggestions.  She  was advised to bring the patient to the emergency room for the ED  physician to evaluate as this is probably dehydration and to rule out  any other  underlying causes.  The patient did, in fact, come to the  emergency room and ER physician called [place] for followup evaluation.  The  patient currently is pain free.  She is very tired, but is responsive  and has had no further nausea since being here in the emergency room.   PAST SURGICAL HISTORY:  Bilateral mastectomy secondary to history of  breast cancer with bilateral breast reconstruction, appendectomy and  right ankle arthroscopy.   SOCIAL HISTORY:  The patient is divorced.  She lives in [place],  lives alone but she does have family support from niece who is in  attendance.  Patient does not smoke.  She does not drink alcohol.  There  is no herbal medicine use or illicit drug use.   FAMILY HISTORY:  Mother did at age 76 from what she states was liver  disease, although it was uncertain the cause of death.  Father died of  emphysema at 29.  She has two sisters with coronary artery disease.   CURRENT MEDICATIONS:  At home:  1. Enteric coated aspirin 325 mg once a day.  2. Lopressor 50 mg b.i.d.  3. Lipitor 20 mg daily.  4. NuIron 150 mg once a day.  5. Folic acid 1 mg daily.  6. Fosamax weekly.  7. Calcium and vitamins once a day.  8. FiberCon once a day.  9. Ambien 10 mg p.r.n.  10.Levoxyl 50 mg daily.  11.Ultram 50-100 mg q.4-6 h. P.r.n. pain (the patient did not take her      Lopressor or Lipitor within the last 24 hours secondary to nausea).   ALLERGIES:  TO MORPHINE AND PENICILLIN.   PHYSICAL EXAMINATION:  VITAL SIGNS:  Blood pressure 121/66, pulse 90,  respirations 20, temperature 98.1.  HEENT:  Head is normocephalic and atraumatic.  Eyes:  PERRLA.  Mucous  membranes are mildly dry.  Lips are cracked.  She does have good  dentition.  NECK:  Neck is supple.  There is no JVD or carotid bruits appreciated.  CARDIOVASCULAR:  Regular rate and rhythm with 2/6 systolic murmur  auscultated.  There were no murmurs, rubs or gallops noted. LUNGS:  Essentially  clear to  auscultation.  Patient is having trouble  taking deep breaths and does use a pillow for sleeping.  ABDOMEN:  Abdomen is soft and nontender, 2+ bowel sounds.  Chest wall is  sore.  EXTREMITIES:  Without clubbing, cyanosis or edema.  Dorsalis pedis  pulses and radial pulses are 1+ bilaterally.  SKIN:  There is a midsternal sternotomy scar which is well healed noted.   LABORATORY DATA:  Chest x-ray reveals increased bibasilar opacities  consistent with atelectasis and possible effusion.  EKG reveals normal  sinus rhythm with ventricular rate of 90 beats per minute.  Current  labs:  Sodium 137, potassium 3.8, chloride 107, BUN 10, hemoglobin 12.3,  hematocrit 37.2, white blood cell is 8.1, platelets 729.  Myoglobin  40.6, CK-MB 1.2, troponin 0.05.   IMPRESSION:  1. Nausea, vomiting and anorexia status post coronary artery bypass      grafting.  2. Questionable dehydration.  3. History of severe triple vessel coronary artery disease.  4. GERD.  5. History of hypertension.  6. History of breast cancer with bilateral mastectomies.   PLAN:  Patient will be admitted and cardiovascular surgery will see  patient in the a.m. and assume care.  Patient will be given hydration,  D5 half normal saline at 75 mm an hour.  She will be treated for nausea  with Zofran.  Questionable GI evaluation at CBTS discretion.  Patient  will have NuIron discontinued as her hemoglobin and hematocrit are  normal.  The patient will also have a UA completed to evaluate for UTI  and BNP will also be ordered.  H. pylori is also going to be drawn for  evaluation secondary to GERD symptoms.  Further treatment per CBTS and  medicine's recommendation.      Bettey Mare. Lyman Bishop, NP     KML/MEDQ  D:  10/15/2006  T:  10/16/2006  Job:  [id number]   cc:   Lavonda Jumbo, M.D.  Salvatore Decent Dorris Fetch, M.D.

## 2011-04-02 NOTE — Assessment & Plan Note (Signed)
[facility]                               PULMONARY OFFICE NOTE   [name], [name]                        MRN:          [id number]  DATE:06/23/2006                            DOB:          [birth year]    PROBLEMS:  1. Allergic rhinitis.  2. Asthma.  3. Bronchitis.   HISTORY:  This never smoker has been previously followed from [facility] for a number of years because of cough and sinus drainage with  background history of bilateral mastectomy.  She has continued receiving her  allergy vaccine at this office and was seen by the nurse practitioner in  January for an acute sinusitis treated with [facility] and Mucinex. Overall  though, she says she has been very much better in the last four years since  she left a job she blamed for many of her ailments.  Recently, she was  started on a Z-pack by her primary physician because of cough, but she is  not yet feeling better.  She describes head congestion with tussive frontal  headaches, sinus congestion, sore throat, ears hurt.  She says sputum has  been purulent but she is not sure that she has fever.  There has been no  blood.  She had some Tussionex used p.r.n. but asks help for cough in the  daytime without sedation.   MEDICATIONS:  1. Fosamax.  2. Levoxyl.  3. Mobic.  4. Multivitamins.  5. Actifed.  6. Tussionex.   DRUG INTOLERANCE:  PENICILLIN and MORPHINE.   OBJECTIVE:  She was not weighed because she has ankle surgery and is wearing  a boot.  Blood pressure 148/90, pulse regular 103, room air saturation 100%.  Medium build, dry cough, thick mucoid post nasal drainage is white.  No  cervical adenopathy.  Tympanic membranes are not retracted.  Her hearing  aids are out.  Voice quality is normal.  No wheeze or rales.  Heart sounds  are regular.  I do not hear a murmur.  Right foot is in a hard boot.   IMPRESSION:  Rhinitis with probable sinusitis.  Asthma with bronchitis.   The  initial issue may have been a viral illness by description but I think she  has had some bacterial sinusitis developing and the air quality is not  helping.   PLAN:  1. Nebulizer treatment, Xopenex 1.25 mg.  2. Benzonatate Perles 100 mg q.6h. p.r.n. #30.  3. Avelox 400 mg daily x7 days.  4. Increase fluids.  5. Schedule appointment 1 year, earlier p.r.n.  Meanwhile she will      continue allergy vaccine at 1:10.                                   Clinton D. Maple Hudson, MD, FCCP, FACP   CDY/MedQ  DD:  06/23/2006  DT:  06/23/2006  Job #:  [id number]   cc:   Lavonda Jumbo, MD

## 2011-04-02 NOTE — Assessment & Plan Note (Signed)
[facility]                            CARDIOLOGY OFFICE NOTE   AYLYN, WENZLER                        MRN:          [id number]  DATE:10/20/2006                            DOB:          1941-11-02    Shayonna Ocampo was seen back in hospital followup at the [facility] on October 20, 2006.  She underwent coronary bypass grafting  surgery on November 12.  She did well postoperatively and was ultimately  discharged home on November 20.  She was in an assisted living facility  for approximately 1 week but was readmitted to the hospital with nausea,  vomiting and anorexia.  She was seen by GI and was found to have mild  erosive gastritis and was restarted on Nexium as well as Reglan.  She  was discharged from the hospital on December 5th and presents today for  a follow up.  Ms. Keltner continues to have failure to thrive.  She reports  poor appetite.  She is complaining of intermittent palpitations.  Last  night she had palpitations and called the EMS.  After her palpitations  began she took a Xanax.  That seemed to relieve her symptoms and when  EMS arrived her blood pressure and heart rate were reasonable and they  did not take her to the hospital.   She denies chest pain or shortness of breath.  She does not have lower  extremity edema.  She clearly has had some problems with postoperative  depression.  She reports feeling anxious and thinks about dying much  more frequently.   She has not had any light headedness or near syncopal episodes.  She has  no other new complaints at this time.   CURRENT MEDICATIONS:  Include:  1. Lopressor 25 mg twice daily, which she has not taken yet today.  2. Fosamax 70 mg weekly.  3. Lipitor 20 mg daily.  4. Levoxyl 50 mcg daily.  5. Fibercon 625 mg daily.  6. Metoclopramide 10 mg 3 times daily.  7. Zolpidem 10 mg at bedtime.  8. Aspirin 325 mg daily.  9. Nexium 40 mg twice daily for 1 week and then  once daily thereafter.   On examination she is alert and oriented in no acute distress.  Weight is 137 pounds.  Blood pressure is 136/82. Heart rate is 108.  Respiratory rate is 16.  HEENT:  Is normal.  NECK:  Normal carotid upstrokes without bruits.  Jugular venous pressure  is normal.  No thyromegaly or nodules are present.  LUNGS:  Are clear to auscultation bilaterally.  CARDIOVASCULAR:  Heart is tachycardic but regular.  There are no murmurs  or gallops.  ABDOMEN:  Is soft, nontender, no organomegaly.  EXTREMITIES:  No clubbing, cyanosis or edema.  Peripheral pulses are 2+  and equal throughout.   ASSESSMENT:  Ms. Muratalla appears stable from a cardiovascular standpoint.  However, she clearly has failure to thrive postoperatively.  Her main  cardiac complaint at present is that of palpitations.  I am not sure if  she is having any significant arrhythmia  at this point.  I would like to  check a 24 hour Holter monitor to look for the possibility of paroxysmal  atrial fibrillation or another supraventricular arrhythmia,  which I  think is most likely.  In the meantime she needs to stay on her  metoprolol which I encouraged her to continue that on a twice daily  basis.  She has sinus tachycardia this morning but has not taken her  metoprolol yet today.  After her Holter results are available, I will be  in contact with her about medication adjustments if necessary.   I advised that she discontinue folate and iron as her last hemoglobin  and hematocrit were within normal limits.  I encouraged her to push  fluids as much as possible and I also talked to her about liberalizing  her diet.  Following a heart healthy diet is clearly important as she  has significant coronary disease, but at this point she has lost 12  pounds and has experienced anorexia.  I think she should eat whatever  sounds good to her.   I will plan on following up with Ms. Mclelland in 4 weeks, or sooner if any  new issues  arise.     [name]. [name], MD  Electronically Signed    MDC/MedQ  DD: 10/20/2006  DT: 10/20/2006  Job #: [id number]   cc:   Salvatore Decent. Dorris Fetch, M.D.  Lavonda Jumbo, M.D.

## 2011-04-02 NOTE — Consult Note (Signed)
[name], [name]                           ACCOUNT NO.:  [id number]   MEDICAL RECORD NO.:  [id number]                   PATIENT TYPE:  AMB   LOCATION:  ENDO                                 FACILITY:  [facility]   PHYSICIAN:  Clinton D. Maple Hudson, M.D.              DATE OF BIRTH:  18-Jul-1941   DATE OF CONSULTATION:  08/06/2002  DATE OF DISCHARGE:  08/06/2002                                   CONSULTATION   INDICATIONS FOR PROCEDURE:  A 70 year old woman, never smoked, with history  of allergic rhinitis, bronchitis, and persistent cough.  The cough has  become intense and unresponsive to conservative management.  Bronchoscopy is  being performed for evaluation.   HISTORY:  Remote bilateral mastectomy for breast cancer.  CT scan shows  bibasilar atelectasis without obstructing lesions, effusions, adenopathy, or  infiltrate.  Nasal congestion with postnasal drainage is noted.  Mild  bilateral wheeze. Normal pulmonary function tests.  Normal cardiac exam.  No  edema.   MEDICATIONS:  1. Advair 100/50 one inhalation b.i.d.  2. Fosamax.  3. Singulair 10 mg q.d.  4. Ambien 10 mg q.h.s. p.r.n.  5. Albuterol inhaler 2 puffs q.i.d. p.r.n.  6. Tussionex used occasionally.   ALLERGIES:  PENICILLIN causes rash, morphine causes rash.   PHYSICAL EXAMINATION:  VITAL SIGNS:  Blood pressure 132/72.  Recent weight  146 pounds.  GENERAL:  Well developed, well nourished.  No adenopathy, cyanosis,  clubbing, or edema.  CARDIAC:  A grade 2/6 systolic ejection murmur which has been stable.  LUNGS:  Dry cough.  Office exam revealed no wheezing.   She is considered medially stable for bronchoscopy.   DESCRIPTION OF PROCEDURE:  After fully informed consent, bronchoscopy was  performed in the endoscopy suite on an outpatient basis.  Premedication was  with Demerol and atropine.  The upper airway was anesthetized topically with  Cetacaine spray and then 1% Xylocaine.  Oxygen was provided at 8 liters per  minute by nasal prongs which held saturation over 92%.  Cardiac monitor  showed regular sinus rhythm.  An Olympus fiberoptic bronchoscope was  advanced through the right nostril to the level of the vocal cords without  difficulty.  Nasopharyngeal secretions were thick and white and required  some effort to suction clear.  The vocal cords moved normally.  The larynx,  trachea, and main carina were unremarkable. Sequential examination of each  lobar and segmental airway bilaterally to the fourth division level revealed  no endobronchial lesions, obstruction, or significant secretions.  There  were minimal thin, clear secretions bilaterally.  There was very mild patchy  bronchial erythema consistent with a mild bronchitis noted on the anterior  walls of the right and left main stem bronchi.  Nothing was considered  appropriate for biopsy or brushing.  All secretions were retained for  laboratory evaluation.  She tolerated the procedure very well and is being  held  until stable and will return home with family to office followup.    FINAL IMPRESSION:  1. Chronic cough.  2. Rhinosinusitis.  3. Minimal bronchitis.                                               Clinton D. Maple Hudson, M.D.    CDY/MEDQ  D:  08/06/2002  T:  08/08/2002  Job:  [id number]   cc:   Ike Bene, M.D.  [address]  [place]  [place] [address]  Fax: [phone]

## 2011-04-02 NOTE — Assessment & Plan Note (Signed)
[facility]                                 ON-CALL NOTE   [name], [name]                        MRN:          [id number]  DATE:10/15/2006                            DOB:          07-Jan-1941    I was called by the patient's home health nurse of October 15, 2006 at  approximately 10:10 as a followup call concerning the patient's status.  The patient had recently had coronary artery bypass grafting according  to the home health nurse on September 26, 2006 and is normally followed  by Dr. [name].   The nurse was stating that the patient was not looking well.  She  appeared to be peaked.  Her blood pressure was found to be 128/90,  respirations 20, but the patient had a rapid heart rate of between 120  and 125 BPM.  The patient was not complaining of any pain, shortness of  breath, nausea and vomiting, however she had been having some diarrhea  previously and had seen her primary care physician and did followup with  her cardiovascular surgeon.  The cardiovascular surgeon did do a chest x-  ray as an outpatient.   Secondary to these concerns, the home health nurse called [place] for further  evaluation.  She states that the patient looks a little dehydrated and  weak and requested our recommendations.   I advised the home health nurse to have the patient been seen in the  emergency room.  If she is indeed dehydrated, obviously this will be  necessary along with labs to evaluate her potassium status.  Any further  recommendations can be done once the patient has been seen and evaluated  by the ER physician and need for [place] to follow her if she needed to be  admitted.     Bettey Mare. Lyman Bishop, NP  Electronically Signed    KML/MedQ  DD: 10/15/2006  DT: 10/16/2006  Job #: [id number]

## 2011-04-02 NOTE — Consult Note (Signed)
[name], [name]:  [id number]   MEDICAL RECORD NO.:  [id number]          PATIENT TYPE:  INP   LOCATION:  3740                         FACILITY:  [facility]   PHYSICIAN:  Iva Boop, MD,FACGDATE OF BIRTH:  26-Oct-1941   DATE OF CONSULTATION:  DATE OF DISCHARGE:                                 CONSULTATION   REQUESTING PHYSICIAN:  Charlett Lango, M.D.   REASON FOR CONSULTATION:  Nausea and vomiting.   ASSESSMENT:  This is a 70 year old white woman who is status post 5-  vessel coronary bypass grafting on November 12.  Prior to that, she had  some burping and indigestion symptoms that did not respond to a proton  pump inhibitor.  She then developed worsening symptoms and it was  discovered that she had angina and 3-vessel coronary artery disease.  After her bypass surgery, she has had problems with nausea and burping  and anorexia.  There is not really early satiety.  She suffers from  chronic constipation and has had a colonoscopy within the last 1 or 2  years by Dr. Yancey Flemings.  She was just prescribed Nexium, but has not  taken it yet.  She was admitted to the hospital with 2 spells of nausea  and vomiting and severe weakness.   She could have peptic ulcer disease, reflux disease, even a  gastrointestinal malignancy of the upper GI tract as she did have some  of these symptoms preoperatively, though I suspect most of those  symptoms were related to angina.   PLAN:  1. Check lipase and amylase to rule pancreatic process though she does      not have pain, so I doubt that is what is going on.  2. Schedule upper GI endoscopy to investigate.  She does take weekly      Fosamax which could cause ulceration as well, as she is on aspirin,      and we need to try to sort out the cause of her problems to better      help her.  Risks, benefits and indications are explained.  She      understands and agrees to proceed.   HISTORY:  This is a 70 year old  white woman with a history of as above.  She has really not felt right since her bypass and has not been eating  well, and has had a lot of anorexia and nausea.  Suffers from chronic  constipation and uses intermittent laxatives.  Denies any melena or  bright red blood per rectum or hematemesis.  She vomited twice prior to  admission and has a lot of dry heaves since admission overnight that  feels a little bit better today.  There is no real abdominal pain.  She  has lost some weight, though I do not know how much at this point.  That  has really been since the surgery, she says.   HOME MEDICATIONS:  1. Enteric-coated aspirin 325 mg daily.  2. Lopressor 50 mg twice daily.  3. Lipitor 20 mg daily.  4. Nu-Iron 150 mg daily.  5.  Folic acid 1 mg daily.  6. Fosamax weekly.  7. Calcium with vitamin D daily.  8. Fibercon daily.  9. Ambien 10 mg p.r.n.  10.Levoxyl 50 mcg daily.  11.Ultram 50/100 p.r.n. pain.   Hospital medications are the same, although she has had Protonix 40 mg  daily added and p.r.n. Phenergan.  The Protonix was changed to 40 mg  b.i.d.  Her Zocor was held.  She was given Reglan 10 mg IV, which she  has not received yet.   DRUG ALLERGIES:  MORPHINE AND PENICILLIN are listed.   PAST MEDICAL HISTORY:  1. Coronary artery disease, 3-vessel, with 5-vessel coronary artery      bypass grafting on November 12.  2. Hypertension.  3. Dyslipidemia.  4. Osteoarthritis.  5. She carries a diagnosis of reflux in the chart, but has not really      been treated for that long-term that I am aware of.  6. Osteoporosis.  7. Bilateral mastectomy and history of breast cancer.  8. Seasonal allergic rhinitis.  9. History of sinusitis.  10.Postoperative blood loss anemia.  11.Appendectomy.  12.Breast reconstruction surgery.  13.Right ankle arthroscopy.   SOCIAL HISTORY:  She lives in [place].  She is divorced.  She lives  alone.  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  Was at [employer] with her grandchildren when she was having  this angina prior to her admission in early November.  Not a smoker.  No  alcohol.   FAMILY HISTORY:  Mother died at age 88, lung disease, breast cancer.  Father died of emphysema at age 13.  There is coronary artery disease in  2 siblings.   REVIEW OF SYSTEMS:  She has had some chills and sweats and diffuse  weakness.  She has had joint pain in the right ankle.  She has had some  palpitations and dyspnea on exertion and some vague chest discomfort  intermittently with a stuttering, aching pain.  She has had some heat  and cold intolerance.  All other systems appear negative.   PHYSICAL EXAMINATION:  GENERAL:  Physical exam reveals a pleasant,  elderly white woman in no acute distress.  She has been trying to eat  and there is a plate full of food.  She says she is eaten all she can  for lunch.  VITAL SIGNS:  Temperature is 96.8, pulse 74 regular, blood pressure  120/70, respirations 20.  HEENT:  Eyes are anicteric.  Mouth:  Free of lesions.  NECK:  Supple.  No thyromegaly or masses.  CHEST:  Clear.  HEART:  S1, S2.  There may be a faint holosystolic murmur.  I hear no  other rubs, murmurs or gallops.  I see no jugular venous distention.  The sternotomy scar is healing.  ABDOMEN:  The abdomen is soft and nontender without organomegaly or  masses.  Bowel sounds are present.  EXTREMITIES:  There is no peripheral edema in the lower extremities.  No  cyanosis, clubbing or edema.  NEUROLOGIC:  She is alert and oriented x3.  LYMPH:  No supraclavicular or cervical adenopathy detected.   X-ray:  Chest x-ray portable shows an increase in bibasilar opacity  consistent with atelectasis and possible effusion.  Stable cardiomegaly.   Lab results show INR 1.2, PTT 30.  BMET:  Glucose 128, sodium 138.  CMET showed normal LFTs.   Albumin 3.1,  total protein 5.9.  Her B-natriuretic  peptide was 163.  Troponins are negative.  CK-MB negative x3 altogether.  Her TSH normal.  White count 8.1, hemoglobin 12.3, platelet count 719.  Her RDW is 14.   EKG:  Normal sinus rhythm. Old anterior infarct.   I appreciate the opportunity to care for this patient.      Iva Boop, MD,FACG  Electronically Signed     CEG/MEDQ  D:  10/16/2006  T:  10/17/2006  Job:  [id number]   cc:   Salvatore Decent. Dorris Fetch, M.D.  [name]. [name], MD

## 2011-04-02 NOTE — Discharge Summary (Signed)
[name], [name]:  [id number]   MEDICAL RECORD NO.:  [id number]          PATIENT TYPE:  INP   LOCATION:  3740                         FACILITY:  [facility]   PHYSICIAN:  Salvatore Decent. Dorris Fetch, M.D.DATE OF BIRTH:  [birth year]   DATE OF ADMISSION:  10/15/2006  DATE OF DISCHARGE:  10/19/2006                               DISCHARGE SUMMARY   PRIMARY ADMITTING DIAGNOSES:  1. Nausea.  2. Vomiting.   ADDITIONAL/DISCHARGE DIAGNOSES:  1. Mild erosive gastritis.  2. Possible short segment Barrett's esophagus.  3. Severe three-vessel coronary artery disease status post coronary      artery bypass graft x5 on September 26, 2006 by Dr. Dorris Fetch.  4. Hypertension.  5. Dyslipidemia.  6. History of gastroesophageal reflux disease.  7. Osteoarthritis.  8. Osteoporosis.  9. History of breast cancer status post bilateral mastectomies.  10.Seasonal allergic rhinitis.  11.History of sinusitis.  12.History of postoperative blood loss anemia.   PROCEDURES PERFORMED:  Esophagogastroduodenoscopy.   HISTORY:  The patient is a 70 year old white female who is status post  coronary artery bypass grafting on September 26, 2006.  She had a routine  postoperative course and was discharged home in good condition on  October 04, 2006.  She spent approximately 1 week in an assisted living  prior to discharge to home.  Since her discharge, she has had ongoing  nausea and vomiting with anorexia.  Apparently, her preoperative  symptoms were very similar to the symptoms she is experiencing now,  namely indigestion-type symptoms with burping and early satiety.  Her  symptoms worsen at this point to the point where she saw Dr. Dorris Fetch  in the office and was given a prescription for Nexium.  She did not get  the prescription filled, but was seen later by her home health nurse who  noted that she was tachycardiac, pale and tired.  Her internal medicine  physician was consulted, and it  was felt that she should be brought to  the emergency department for further evaluation.  She did present to the  emergency department, and, upon evaluation, was noted to be somewhat  dehydrated, and it was felt that she should be admitted for further  workup.   HOSPITAL COURSE:  Following her admission, cardiology and cardiothoracic  surgery was consulted for routine follow-up.  Because her symptoms have  been persistent since prior to surgery, it was felt that she should be  seen by gastroenterology in consultation, as well.  She has apparently  seen Dr. Yancey Flemings in the past as an outpatient.  She was seen by Dr.  Stan Head and subsequently underwent panendoscopy on October 17, 2006.  She was noted to have evidence of mild erosive gastritis and a  possible short segment Barrett's esophagus.  She was restarted on a  higher dose of Nexium, as well as Reglan.  She was also noted to have  some loose stools in the setting of chronic constipation, and her stools  were sent for Clostridium difficile.  Cultures at this point are  pending; however, her symptoms have improved somewhat.  She is  tolerating her diet at this point without any problems.  She has been  rehydrated and symptomatically is feeling better.  Biopsies were taken  during her procedure and sent for H. Pylori panel, and this remains  pending.  She has remained stable during this admission from a cardiac  standpoint, and, in fact, her beta-blocker dose has been decreased.  It  is felt that since she has symptomatically improved with conservative  treatment that hopefully she will be ready for discharge home in the  next 24 hours if she continues to remain stable from a GI standpoint.  Her most recent labs show a sodium of 140, potassium 4.4, BUN 3,  creatinine 0.6.  Hemoglobin of 12.3, hematocrit 37.2, white count 8.1,  platelets 729.  She will have a repeat basic metabolic panel drawn on  the morning of October 19, 2006.   Her other labs will also be reviewed  at that time for the need for further treatment.  Otherwise, she has  remained afebrile, and all vital signs have been stable.  She is  ambulating without difficulty.  Her surgical incisions are all healing  well.  She is maintaining normal sinus rhythm, and otherwise her  physical exam is benign.  She will be evaluated on morning rounds on  October 19, 2006, and if she has remained stable, it is anticipated that  she will be able to be discharged home at that time.   DISCHARGE MEDICATIONS:  1. Darvocet N 100 one to two q.4h. p.r.n. pain.  2. Nexium 40 mg b.i.d. x1 week then daily.  3. Reglan 10 mg q.a.c. and h.s. as needed.  4. Lopressor 25 mg b.i.d.  5. Lipitor 20 mg daily.  6. Levoxyl 50 mcg daily.  7. Aspirin 81 mg daily.  8. FiberCon 625 mg daily.  9. Fosamax weekly.   DISCHARGE INSTRUCTIONS:  She is asked to continue to refrain from  driving, heavy lifting or strenuous activity and to continue sternal  precautions until seen by Dr. Dorris Fetch.  She will continue a low-fat,  low-sodium diet.  She may shower daily and clean her surgery incisions  with soap and water.   DISCHARGE FOLLOWUP:  She will see Dr. [name] and Dr. Dorris Fetch back at  their previously scheduled appointments.  She will also need to follow  up with Dr. Marina Goodell as directed.      Coral Ceo, P.A.    ______________________________  Salvatore Decent Dorris Fetch, M.D.    GC/MEDQ  D:  10/18/2006  T:  10/19/2006  Job:  [id number]   cc:   [name]. [name], MD  Lavonda Jumbo, M.D.  [facility] office

## 2011-04-02 NOTE — Telephone Encounter (Signed)
Pt inquiring as to results of Bone Density Test, and if Fosamax is needed.? Pt stated no hurry, Monday or Tuesday would be fine.

## 2011-04-02 NOTE — Consult Note (Signed)
NAMEARIS, MOMAN NO.:  [id number]   MEDICAL RECORD NO.:  [id number]          PATIENT TYPE:  INP   LOCATION:  2029                         FACILITY:  [facility]   PHYSICIAN:  Salvatore Decent. Dorris Fetch, M.D.DATE OF BIRTH:  1940/11/29   DATE OF CONSULTATION:  09/23/2006  DATE OF DISCHARGE:                                   CONSULTATION   REASON FOR CONSULTATION:  Severe three-vessel coronary disease.   HISTORY OF PRESENT ILLNESS:  Ms. Witherell is a 70 year old female with no prior  cardiac history.  She over the past month had been noting significant reflux-  type symptoms with frequent belching, burping and a burning chest  discomfort. She took her grandchildren to [place] last week and while  there had the onset of substernal chest pain.  She described this as a  heaviness, moderately intense and radiated to her left arm.  She did have  shortness of breath in association with exertion.  She has not had any rest  or nocturnal chest or arm pain.  She was admitted yesterday and had mildly  elevated troponins.  She was seen in consultation by cardiology and  treated  with intravenous heparin and nitroglycerin and today underwent cardiac  catheterization where she was found to have severe three-vessel coronary  disease with small diffusely diseased target vessels particularly in the LAD  system.  She had normal LV function. She had an ejection fraction of 65%.  She has a murmur on exam, but had no gradient at the time of  catheterization. She currently is pain free.   PAST MEDICAL HISTORY:  Is significant for  1. Borderline hypertension.  2. History of breast cancer, status post bilateral mastectomy with      reconstruction with no evidence of recurrence. She was first treated in      1992.  3. She also has osteoporosis.  4. Osteoarthritis.  5. Seasonal allergies.  6. History of sinusitis.  7. Dyslipidemia.  8. Gastroesophageal reflux.   MEDICATIONS:  On  admission:  1. Mobic 7.5 mg daily.  2. Calcium.  3. Fibercon/  4. Ambien 10 mg p.o. q.h.s. p.r.n.  5. Fosamax 35 mg p.o. weekly.  6. Levoxyl.   PAST SURGICAL HISTORY:  1. Right ankle arthroscopy.  2. Appendectomy.  3. Hysterectomy.  4. Bilateral breast surgery with reconstruction.   ALLERGIES:  MORPHINE and PENICILLIN which cause rashes.   FAMILY HISTORY:  She has two siblings who have had coronary artery disease.   SOCIAL HISTORY:  She is divorced, but lives alone. Has limited family in the  area.  She is a nonsmoker and nondrinker.   REVIEW OF SYSTEMS:  Seasonal allergies, frequent runny nose, occasional  wheezing, chest pain, shortness of breath and left arm pain as noted. New  onset of indigestion with frequent belching.   PHYSICAL EXAMINATION:  Ms. Choi is a 70 year old white female in no acute  distress.  NEUROLOGIC: She is alert, oriented x3.  She is appropriate and grossly  intact.  HEENT:  Exam is unremarkable.  She is wearing glasses.  NECK:  Neck is supple.  She has transmitted murmurs bilaterally.  CARDIAC: Exam has regular rate and rhythm.  Normal S1-S2.  There is 2/6  systolic murmur heard throughout the precordium loudest at the right upper  sternal border.  LUNGS:  Clear with equal breath sounds.  CHEST: She has bilateral scars from previous mastectomies with implants  bilaterally.  ABDOMEN: Soft and nontender.  EXTREMITIES:  Without clubbing, cyanosis or edema shows 2+ posterior tibial  pulses bilaterally,2+ radial pulses bilaterally.  SKIN: Warm and dry.   LABORATORY DATA:  Chest x-ray:  Showed no active disease. EKG showed sinus  rhythm with poor R-wave progression across precordium consistent with  anterior infarct age undetermined.  She does have Qs in III and aVF  consistent with inferior infarct age undetermined.  PT was 13.9, PTT 27  prior to heparin.  Her CK was 265 with an MB of 4.1 with a normal relative  index.  Troponin was 0.1 sodium 138,  potassium 3.6, BUN and creatinine 11  and 0.7. Her white count was 6.9, hematocrit 40, platelets 389.   IMPRESSION:  Ms. Blackie is a 70 year old lady who presents with new onset  unstable angina and catheterization. She has severe three-vessel disease  with diffusely diseased vessels with poor targets particularly in the LAD  distribution but with preserved left ventricular function. Coronary artery  bypass grafting is indicated for survival benefit and relief of symptoms.  She is not an ideal candidate for bypass grafting due to diffuse disease and  relatively small and poor targets, however bypass grafting still provides  her best chance for symptom relief and survival benefit.  She is however at  risk for early and/or late graft failure.   I discussed in detail with Mrs. Kruser and her niece the indications, risks,  benefits and alternative treatments.  She understands the risks include, but  are not limited to death, stroke, MI, DVT, PE, bleeding, possible need for  transfusions, infections as well as other organ system dysfunction including  respiratory,  renal or GI complications.  She understands and accepts these  risks and agrees to proceed.  We will plan to proceed with surgery, first  case Monday morning.  The patient will be maintained on heparin and  nitroglycerin drips. Carotid Dopplers will be performed. All of the  patient's questions were answered.  She is comfortable with the plan as  outlined.           ______________________________  Salvatore Decent. Dorris Fetch, M.D.    SCH/MEDQ  D:  09/23/2006  T:  09/24/2006  Job:  [id number]   cc:   [name]. [name], MD  Lavonda Jumbo, M.D.

## 2011-04-02 NOTE — Assessment & Plan Note (Signed)
[facility]                            CARDIOLOGY OFFICE NOTE   Angelica, VANOVERBEKE                        MRN:          [id number]  DATE:11/14/2006                            DOB:          1941/06/03    [name] returns for cardiology followup on November 14, 2006. She is  a 70 year old woman who underwent coronary bypass surgery on November  12. She has had some difficulty with decreased appetite, fatigue,  malaise and anxiety postoperatively. She has not had chest pain or  shortness of breath. Her biggest complaints currently seem to be related  to anxiety. She complains of developing a great deal of anxiety related  to many different issues. She says it is even difficult for her to watch  the news because she becomes stressed about current events. Dr. Lynelle Doctor  has recently started her on Lexapro. She has been on this medication for  3 weeks now, but has not appreciated much difference in her symptoms.  She does complain of pain in the right breast area, around an implant  that seems to be related to doing arm exercises. She has no other new  complaints at today's visit. She denies chest pain, dyspnea,  lightheadedness, syncope or orthopnea or PND.   CURRENT MEDICATIONS:  1. Lopressor 25 mg twice daily.  2. Fosamax 70 mg weekly.  3. Lipitor 20 mg daily.  4. Levoxyl 50 mcg daily.  5. Nexium 40 mg daily.  6. Aspirin 81 mg daily.  7. Lexapro 10 mg daily.  8. Ambien CR 12.5 mg at bedtime.  9. Senna plus at bedtime.   PHYSICAL EXAMINATION:  She is alert and oriented and in no acute  distress. Weight is 133 pounds. Blood pressure is 138/77. Heart rate is  61. Respiratory rate is 12.  HEENT: Is normal.  NECK: Normal carotid upstrokes without bruits. Jugular venous pressure  is normal.  LUNGS:  Are clear to auscultation bilaterally.  CARDIOVASCULAR: Regular rate and rhythm without murmurs or gallops.  ABDOMEN: Soft and nontender.  EXTREMITIES:  No clubbing, cyanosis or edema. Peripheral pulses are 2+  and equal throughout.   EKG: Shows normal sinus rhythm with a left atrial enlargement pattern  and is otherwise normal.   ASSESSMENT:  [name] is currently stable from a cardiac standpoint with  regard to her coronary artery disease, status post coronary bypass  surgery. Overall, I think she is doing much better. Her heart rate is  under much better control at today's visit. Previously, she had a  resting heart rate in the range of 100 and today it is 60 beats per  minute. I encouraged her about the progress that she has made since her  last visit here. She is going to enroll in cardiac rehab and I think  that will be very beneficial for her. She is still having difficulty  with sleep, but hopefully this will continue to improve over time. She  will be scheduled for followup lipids and LFTs now that she is on  atorvastatin for her hypercholesterolemia.   I will plan on  seeing her back in 8 weeks for followup.     [name]. [name], MD  Electronically Signed    MDC/MedQ  DD: 11/14/2006  DT: 11/14/2006  Job #: [id number]   cc:   Lavonda Jumbo, M.D.

## 2011-04-04 NOTE — Telephone Encounter (Signed)
Bone density improved since 2008 dexa scan, now osteopenia, no osteoporosis - no need to continue fosamax at this time but continue Calcium 1200mg /d and Vit D 1000U/day. thanks

## 2011-04-05 NOTE — Telephone Encounter (Signed)
Notified pt with md response.Marland KitchenMarland Kitchen5/21/12@1 :44pm/LMB

## 2011-04-06 ENCOUNTER — Encounter: Payer: Self-pay | Admitting: Internal Medicine

## 2011-04-06 ENCOUNTER — Ambulatory Visit (INDEPENDENT_AMBULATORY_CARE_PROVIDER_SITE_OTHER): Payer: Medicare Other

## 2011-04-06 ENCOUNTER — Telehealth: Payer: Self-pay | Admitting: *Deleted

## 2011-04-06 DIAGNOSIS — J309 Allergic rhinitis, unspecified: Secondary | ICD-10-CM

## 2011-04-06 NOTE — Telephone Encounter (Signed)
.  A user error has taken place: error

## 2011-04-09 ENCOUNTER — Encounter: Payer: Self-pay | Admitting: Internal Medicine

## 2011-04-15 ENCOUNTER — Ambulatory Visit (INDEPENDENT_AMBULATORY_CARE_PROVIDER_SITE_OTHER): Payer: Medicare Other

## 2011-04-15 DIAGNOSIS — J309 Allergic rhinitis, unspecified: Secondary | ICD-10-CM

## 2011-04-19 ENCOUNTER — Encounter: Payer: Self-pay | Admitting: Internal Medicine

## 2011-04-21 ENCOUNTER — Ambulatory Visit (INDEPENDENT_AMBULATORY_CARE_PROVIDER_SITE_OTHER): Payer: Medicare Other

## 2011-04-21 DIAGNOSIS — J309 Allergic rhinitis, unspecified: Secondary | ICD-10-CM

## 2011-04-26 ENCOUNTER — Other Ambulatory Visit: Payer: Self-pay

## 2011-04-26 ENCOUNTER — Ambulatory Visit (INDEPENDENT_AMBULATORY_CARE_PROVIDER_SITE_OTHER): Payer: Medicare Other

## 2011-04-26 DIAGNOSIS — J309 Allergic rhinitis, unspecified: Secondary | ICD-10-CM

## 2011-04-26 MED ORDER — ZOLPIDEM TARTRATE 10 MG PO TABS: 2.5000 mg | ORAL_TABLET | Freq: Every evening | ORAL | Status: DC | PRN

## 2011-04-26 MED ORDER — LEVOTHYROXINE SODIUM 75 MCG PO TABS: 75.0000 ug | ORAL_TABLET | Freq: Every day | ORAL | Status: DC

## 2011-05-05 ENCOUNTER — Ambulatory Visit (INDEPENDENT_AMBULATORY_CARE_PROVIDER_SITE_OTHER): Payer: Medicare Other

## 2011-05-05 DIAGNOSIS — J309 Allergic rhinitis, unspecified: Secondary | ICD-10-CM

## 2011-05-06 ENCOUNTER — Telehealth: Payer: Self-pay | Admitting: Cardiovascular Disease

## 2011-05-06 NOTE — Telephone Encounter (Signed)
Spoke with pt. She will make morning appt for office visit with Dr. [name] and fast the day of appt.

## 2011-05-06 NOTE — Telephone Encounter (Signed)
Pt calling to make a fu appt with cooper , does she need lab work?

## 2011-05-10 ENCOUNTER — Ambulatory Visit (INDEPENDENT_AMBULATORY_CARE_PROVIDER_SITE_OTHER): Payer: Medicare Other

## 2011-05-10 ENCOUNTER — Telehealth: Payer: Self-pay | Admitting: Cardiovascular Disease

## 2011-05-10 DIAGNOSIS — J309 Allergic rhinitis, unspecified: Secondary | ICD-10-CM

## 2011-05-10 DIAGNOSIS — E785 Hyperlipidemia, unspecified: Secondary | ICD-10-CM

## 2011-05-10 MED ORDER — ATORVASTATIN CALCIUM 20 MG PO TABS: 20.0000 mg | ORAL_TABLET | Freq: Every day | ORAL | Status: DC

## 2011-05-10 NOTE — Telephone Encounter (Signed)
Left message to call back  

## 2011-05-10 NOTE — Telephone Encounter (Signed)
Note corrected from below. Pt will change to generic Lipitor 20 mg daily after completing current 30 day supply of Crestor. Has office visit in August with Dr. [name] and will discuss timing of follow up blood work at that time. Will sent prescription to [facility] on [address] per pt request.

## 2011-05-10 NOTE — Telephone Encounter (Signed)
Per Dr. [name] pt can change to generic Lipitor 20 mg daily and discontinue Lipitor. Pt given this information. She has 30 tablets left of Crestor and will finish these. She will then switch to

## 2011-05-10 NOTE — Telephone Encounter (Signed)
Pt calling re being on crestor has appt 8-14 , won't have enough to last until then, wants to know if she can change to generic lipitor due to crestor being so expensive, to [facility] 90 supply-pls call and let her know and will she need blood work prior to next visit, if so does she need to fast?

## 2011-05-17 ENCOUNTER — Encounter: Payer: Self-pay | Admitting: Cardiovascular Disease

## 2011-05-21 ENCOUNTER — Ambulatory Visit (INDEPENDENT_AMBULATORY_CARE_PROVIDER_SITE_OTHER): Payer: Medicare Other | Admitting: Internal Medicine

## 2011-05-21 ENCOUNTER — Encounter: Payer: Self-pay | Admitting: Internal Medicine

## 2011-05-21 ENCOUNTER — Ambulatory Visit (INDEPENDENT_AMBULATORY_CARE_PROVIDER_SITE_OTHER): Payer: Medicare Other

## 2011-05-21 VITALS — BP 118/82 | HR 63 | Temp 97.0°F | Ht 65.5 in

## 2011-05-21 DIAGNOSIS — J309 Allergic rhinitis, unspecified: Secondary | ICD-10-CM

## 2011-05-21 DIAGNOSIS — J45909 Unspecified asthma, uncomplicated: Secondary | ICD-10-CM

## 2011-05-21 DIAGNOSIS — J019 Acute sinusitis, unspecified: Secondary | ICD-10-CM

## 2011-05-21 MED ORDER — MOXIFLOXACIN HCL 400 MG PO TABS: 400.0000 mg | ORAL_TABLET | Freq: Every day | ORAL | Status: AC

## 2011-05-21 MED ORDER — METHYLPREDNISOLONE ACETATE 80 MG/ML IJ SUSP
80.0000 mg | Freq: Once | INTRAMUSCULAR | Status: AC
  Administered 2011-05-21: 80 mg via INTRAMUSCULAR

## 2011-05-21 MED ORDER — PREDNISONE (PAK) 10 MG PO TABS: 10.0000 mg | ORAL_TABLET | ORAL | Status: AC

## 2011-05-21 MED ORDER — AZELASTINE HCL 0.1 % NA SOLN: 2.0000 | Freq: Two times a day (BID) | NASAL | Status: DC

## 2011-05-21 NOTE — Progress Notes (Signed)
  Subjective:     [name] is a 70 y.o. female who presents for evaluation of sinus pain. Symptoms include: cough, headaches, mouth breathing, nasal congestion, post nasal drip and sinus pressure. Onset of symptoms was 2 weeks ago. Symptoms have been gradually worsening since that time. Past history is significant for asthma and occasional episodes of bronchitis. Patient is a non-smoker.  The following portions of the patient's history were reviewed and updated as appropriate: allergies, current medications, past family history, past medical history, past social history, past surgical history and problem list.  Review of Systems Constitutional: negative for anorexia Ears, nose, mouth, throat, and face: negative for epistaxis and hoarseness Respiratory: negative for hemoptysis and wheezing Cardiovascular: negative for chest pain and palpitations   Objective:    BP 118/82  Pulse 63  Temp(Src) 97 F (36.1 C) (Oral)  Ht 5' 5.5" (1.664 m)  SpO2 97% General appearance: alert, cooperative and mild distress Head: Normocephalic, without obvious abnormality, atraumatic mild frontal sinus tenderness to palpation Ears: normal TM's and external ear canals both ears Throat: lips, mucosa, and tongue normal; teeth and gums normal Lungs: clear to auscultation bilaterally Heart: regular rate and rhythm, S1, S2 normal, no murmur, click, rub or gallop    Assessment:    Acute bacterial sinusitis.  Asthma Allergic rhinitis   Plan:    Nasal saline sprays. Neti pot recommended. Instructions given. Nasal steroids per medication orders. Avelox per medication orders.  Alb neb and medrol shot today Also add astelin spray - erx done

## 2011-05-21 NOTE — Patient Instructions (Signed)
It was good to see you today. Nebulizer treat,ent, steroid shot given in office today - Pred pak and Avleox antibiotics + astelin spray for sinus and allergy symptoms - Your prescription(s) have been submitted to your pharmacy. Please take as directed and contact our office if you believe you are having problem(s) with the medication(s).

## 2011-05-26 ENCOUNTER — Ambulatory Visit (INDEPENDENT_AMBULATORY_CARE_PROVIDER_SITE_OTHER): Payer: Medicare Other

## 2011-05-26 DIAGNOSIS — J309 Allergic rhinitis, unspecified: Secondary | ICD-10-CM

## 2011-06-01 ENCOUNTER — Ambulatory Visit (INDEPENDENT_AMBULATORY_CARE_PROVIDER_SITE_OTHER): Payer: Medicare Other

## 2011-06-01 DIAGNOSIS — J309 Allergic rhinitis, unspecified: Secondary | ICD-10-CM

## 2011-06-09 ENCOUNTER — Ambulatory Visit (INDEPENDENT_AMBULATORY_CARE_PROVIDER_SITE_OTHER): Payer: Medicare Other

## 2011-06-09 DIAGNOSIS — J309 Allergic rhinitis, unspecified: Secondary | ICD-10-CM

## 2011-06-15 ENCOUNTER — Ambulatory Visit (INDEPENDENT_AMBULATORY_CARE_PROVIDER_SITE_OTHER): Payer: Medicare Other

## 2011-06-15 DIAGNOSIS — J309 Allergic rhinitis, unspecified: Secondary | ICD-10-CM

## 2011-06-23 ENCOUNTER — Ambulatory Visit (INDEPENDENT_AMBULATORY_CARE_PROVIDER_SITE_OTHER): Payer: Medicare Other

## 2011-06-23 DIAGNOSIS — J309 Allergic rhinitis, unspecified: Secondary | ICD-10-CM

## 2011-06-29 ENCOUNTER — Ambulatory Visit (INDEPENDENT_AMBULATORY_CARE_PROVIDER_SITE_OTHER): Payer: Medicare Other

## 2011-06-29 ENCOUNTER — Encounter: Payer: Self-pay | Admitting: Cardiovascular Disease

## 2011-06-29 ENCOUNTER — Ambulatory Visit (INDEPENDENT_AMBULATORY_CARE_PROVIDER_SITE_OTHER): Payer: Medicare Other | Admitting: Cardiovascular Disease

## 2011-06-29 DIAGNOSIS — J309 Allergic rhinitis, unspecified: Secondary | ICD-10-CM

## 2011-06-29 DIAGNOSIS — I2510 Atherosclerotic heart disease of native coronary artery without angina pectoris: Secondary | ICD-10-CM

## 2011-06-29 DIAGNOSIS — I6529 Occlusion and stenosis of unspecified carotid artery: Secondary | ICD-10-CM

## 2011-06-29 DIAGNOSIS — E785 Hyperlipidemia, unspecified: Secondary | ICD-10-CM

## 2011-06-29 LAB — LIPID PANEL
HDL: 64.7 mg/dL (ref >39.00)
Total CHOL/HDL Ratio: 2
Triglycerides: 102 mg/dL (ref 0.0–149.0)
VLDL: 20.4 mg/dL (ref 0.0–40.0)

## 2011-06-29 LAB — HEPATIC FUNCTION PANEL
ALT: 15 U/L (ref 0–35)
AST: 26 U/L (ref 0–37)
Albumin: 4.3 g/dL (ref 3.5–5.2)
Total Bilirubin: 0.8 mg/dL (ref 0.3–1.2)

## 2011-06-29 NOTE — Assessment & Plan Note (Addendum)
Lipids from 2011 show a total cholesterol of 161, HDL 56, and LDL 92.  The patient discontinued Crestor and started atorvastatin 6 weeks ago and she will have followup lipids and LFTs drawn today.

## 2011-06-29 NOTE — Assessment & Plan Note (Addendum)
Cardiac catheterization in 2011 demonstrated patency of all bypass grafts. The patient is on a medical regimen includes aspirin for antiplatelet therapy, metoprolol for beta blockade, and atorvastatin for lipid lowering. She will followup in 6 months.

## 2011-06-29 NOTE — Patient Instructions (Signed)
Your physician wants you to follow-up in: 6 months with Dr.Cooper.  You will receive a reminder letter in the mail two months in advance. If you don't receive a letter, please call our office to schedule the follow-up appointment.  You had lab work done today.

## 2011-06-29 NOTE — Assessment & Plan Note (Signed)
The patient had mild carotid stenosis noted by ultrasound in 2010. She is due for followup later this month. If she continues to have less than 40% stenosis she probably does not need any further imaging.

## 2011-06-29 NOTE — Progress Notes (Signed)
HPI:  [name] returns for followup evaluation. She is a delightful 70 year old woman with coronary artery disease status post coronary bypass surgery in 2007 after presenting with non-ST elevation infarction.  She developed recurrent chest pain last year and underwent cardiac catheterization in December 2011. This demonstrated patency of all 5 of her bypass grafts. She had diffuse distal vessel disease noted there were no targets for coronary intervention. The patient's left ventricular function has been preserved.   The patient is doing well from a symptomatic standpoint. She changed from Crestor Lipitor about 6 weeks ago and would like to have followup lipids today as she is fasting. She denies exertional chest pain or pressure. She denies exertional dyspnea, palpitations, edema, or PND. She did have a problem of leg cramps at night and I talked to her about trying tonic water. We also discussed the importance of staying well hydrated with plenty of fluid intake. She has no exertional calf cramping.  Outpatient Encounter Prescriptions as of 06/29/2011  Medication Sig Dispense Refill  . aspirin 81 MG tablet Take 81 mg by mouth daily.        Marland Kitchen azelastine (ASTELIN) 137 MCG/SPRAY nasal spray Place 2 sprays into the nose 2 (two) times daily. Use in each nostril as directed  30 mL  2  . Calcium Carbonate (CALTRATE 600 PO) Take 2 tablets by mouth daily.        . Coenzyme Q10 (CO Q-10 PO) Take 50 mg by mouth daily.        Marland Kitchen levothyroxine (LEVOXYL) 75 MCG tablet Take 1 tablet (75 mcg total) by mouth daily.  90 tablet  1  . loratadine (CLARITIN) 10 MG tablet Take 10 mg by mouth daily.        . meloxicam (MOBIC) 15 MG tablet Take 15 mg by mouth daily. TAke 1/4 tablet       . metoprolol tartrate (LOPRESSOR) 25 MG tablet Take 1 tablet by mouth Twice daily.      . multivitamin (THERAGRAN) per tablet Take 1 tablet by mouth daily.        . pantoprazole (PROTONIX) 40 MG tablet Take 40 mg by mouth daily.        Marland Kitchen  senna-docusate (SENOKOT-S) 8.6-50 MG per tablet Take 1 tablet by mouth daily.        Marland Kitchen zolpidem (AMBIEN) 10 MG tablet Take 0.5 tablets (5 mg total) by mouth at bedtime as needed for sleep. Takes 1/4 tablets/sleep  23 tablet  1  . DISCONTD: peg 3350 powder (MOVIPREP) 100 G SOLR MOVI PREP take as directed  1 kit  0  . DISCONTD: rosuvastatin (CRESTOR) 10 MG tablet Take 10 mg by mouth daily.         Facility-Administered Encounter Medications as of 06/29/2011  Medication Dose Route Frequency Provider Last Rate Last Dose  . 0.9 %  sodium chloride infusion  500 mL Intravenous Continuous Yancey Flemings, MD        Allergies  Allergen Reactions  . Clarithromycin   . Codeine     REACTION: nausea  . Levofloxacin     REACTION: nausea  . Morphine   . Naproxen     REACTION: nausea  . Penicillins     REACTION: RASH    Past Medical History  Diagnosis Date  . Hyperlipidemia   . Hypertension   . Allergy     year round   . Arthritis   . Blood transfusion   . Cancer  left breast  . COPD (chronic obstructive pulmonary disease)   . Asthma   . GERD (gastroesophageal reflux disease)   . Heart murmur   . Osteopenia   . Thyroid disease     hypo  . Diverticulitis of colon   . Aortic stenosis, moderate   . History of DVT (deep vein thrombosis)   . Coronary artery disease     s/p multivessel CABG 2007  . Rhinosinusitis     recurrent    ROS: Negative except as per HPI  BP 138/72  Pulse 62  Ht 5\' 3"  (1.6 m)  Wt 140 lb 12.8 oz (63.866 kg)  BMI 24.94 kg/m2  PHYSICAL EXAM: Pt is alert and oriented, NAD HEENT: normal Neck: JVP - normal, carotids 2+= without bruits Lungs: CTA bilaterally CV: RRR without murmur or gallop Abd: soft, NT, Positive BS, no hepatomegaly Ext: no C/C/E, distal pulses intact and equal Skin: warm/dry no rash  EKG:  Normal sinus rhythm 62 beats per minute, left atrial enlargement, cannot rule out anterior infarct age undetermined.  ASSESSMENT AND  PLAN:

## 2011-07-05 ENCOUNTER — Ambulatory Visit (INDEPENDENT_AMBULATORY_CARE_PROVIDER_SITE_OTHER): Payer: Medicare Other

## 2011-07-05 DIAGNOSIS — J309 Allergic rhinitis, unspecified: Secondary | ICD-10-CM

## 2011-07-14 ENCOUNTER — Other Ambulatory Visit: Payer: Self-pay | Admitting: Cardiology

## 2011-07-14 ENCOUNTER — Encounter (INDEPENDENT_AMBULATORY_CARE_PROVIDER_SITE_OTHER): Payer: Medicare Other | Admitting: Cardiology

## 2011-07-14 DIAGNOSIS — I6529 Occlusion and stenosis of unspecified carotid artery: Secondary | ICD-10-CM

## 2011-07-16 ENCOUNTER — Ambulatory Visit (INDEPENDENT_AMBULATORY_CARE_PROVIDER_SITE_OTHER): Payer: Medicare Other

## 2011-07-16 DIAGNOSIS — J309 Allergic rhinitis, unspecified: Secondary | ICD-10-CM

## 2011-07-20 ENCOUNTER — Ambulatory Visit (INDEPENDENT_AMBULATORY_CARE_PROVIDER_SITE_OTHER): Payer: Medicare Other

## 2011-07-20 DIAGNOSIS — J309 Allergic rhinitis, unspecified: Secondary | ICD-10-CM

## 2011-07-26 ENCOUNTER — Encounter: Payer: Self-pay | Admitting: Internal Medicine

## 2011-07-26 ENCOUNTER — Other Ambulatory Visit: Payer: Self-pay | Admitting: Cardiovascular Disease

## 2011-07-26 ENCOUNTER — Ambulatory Visit (INDEPENDENT_AMBULATORY_CARE_PROVIDER_SITE_OTHER): Payer: Medicare Other

## 2011-07-26 DIAGNOSIS — J309 Allergic rhinitis, unspecified: Secondary | ICD-10-CM

## 2011-07-26 NOTE — Telephone Encounter (Signed)
Please calling 90 day supply

## 2011-07-27 MED ORDER — ATORVASTATIN CALCIUM 20 MG PO TABS: 20.0000 mg | ORAL_TABLET | Freq: Every day | ORAL | Status: DC

## 2011-07-27 NOTE — Telephone Encounter (Signed)
Pt returning call to Pat. Please call back.  

## 2011-07-28 ENCOUNTER — Telehealth: Payer: Self-pay | Admitting: *Deleted

## 2011-07-28 DIAGNOSIS — E785 Hyperlipidemia, unspecified: Secondary | ICD-10-CM

## 2011-07-28 MED ORDER — ATORVASTATIN CALCIUM 20 MG PO TABS: 20.0000 mg | ORAL_TABLET | Freq: Every day | ORAL | Status: DC

## 2011-07-28 NOTE — Telephone Encounter (Signed)
Pt would like 90 day supply of atorvastatin sent to [facility]. Will e-prescribe.

## 2011-07-29 ENCOUNTER — Ambulatory Visit (INDEPENDENT_AMBULATORY_CARE_PROVIDER_SITE_OTHER): Payer: Medicare Other | Admitting: Internal Medicine

## 2011-07-29 ENCOUNTER — Encounter: Payer: Self-pay | Admitting: Internal Medicine

## 2011-07-29 ENCOUNTER — Ambulatory Visit (INDEPENDENT_AMBULATORY_CARE_PROVIDER_SITE_OTHER)
Admission: RE | Admit: 2011-07-29 | Discharge: 2011-07-29 | Disposition: A | Discharge status: Home / Self Care | Payer: Medicare Other | Source: Ambulatory Visit | Attending: Internal Medicine | Admitting: Internal Medicine

## 2011-07-29 ENCOUNTER — Other Ambulatory Visit: Payer: Self-pay

## 2011-07-29 VITALS — BP 120/70 | HR 56 | Temp 97.3°F | Ht 66.0 in | Wt 147.0 lb

## 2011-07-29 DIAGNOSIS — M25559 Pain in unspecified hip: Secondary | ICD-10-CM

## 2011-07-29 DIAGNOSIS — M533 Sacrococcygeal disorders, not elsewhere classified: Secondary | ICD-10-CM

## 2011-07-29 DIAGNOSIS — M549 Dorsalgia, unspecified: Secondary | ICD-10-CM

## 2011-07-29 DIAGNOSIS — M25551 Pain in right hip: Secondary | ICD-10-CM

## 2011-07-29 MED ORDER — METHOCARBAMOL 500 MG PO TABS: 500.0000 mg | ORAL_TABLET | Freq: Every evening | ORAL | Status: DC | PRN

## 2011-07-29 MED ORDER — METHOCARBAMOL 500 MG PO TABS: 500.0000 mg | ORAL_TABLET | Freq: Every evening | ORAL | Status: AC | PRN

## 2011-07-29 NOTE — Patient Instructions (Signed)
It was good to see you today. Test(s) ordered today. Your results will be called to you after review (48-72hours after test completion). If any changes need to be made, you will be notified at that time. Increase meloxicam to 1 whole pill (15mg ) x 3 days and use muscle relaxer (robaxin) at night - Your prescription(s) have been submitted to your pharmacy. Please take as directed and contact our office if you believe you are having problem(s) with the medication(s). Will consider refer to physical therapy at [facility] as discussed depending on your results

## 2011-07-29 NOTE — Progress Notes (Signed)
Subjective:    Patient ID: [name], female    DOB: Jan 04, 1941, 70 y.o.   MRN: [id number]  HPI  complains of back pain Onset 2-3 weeks ago Denies precipitating activity or injury/fall Located right low back Worse lying on right side at night and with standing/walking Better pain control sitting/at rest No fever, no weakness or falls No numbness or radiation of pain symptoms in LE Risk factors: hx breast ca, osteoporosis, prior back surg for HNP  Past Medical History  Diagnosis Date  . Hyperlipidemia   . Hypertension   . Allergy     year round   . Arthritis   . Blood transfusion   . Cancer     left breast  . COPD (chronic obstructive pulmonary disease)   . Asthma   . GERD (gastroesophageal reflux disease)   . Heart murmur   . Osteopenia   . Diverticulitis of colon   . Aortic stenosis, moderate   . History of DVT (deep vein thrombosis)   . Coronary artery disease     s/p multivessel CABG 2007  . ALLERGIC RHINITIS   . ASTHMA   . BREAST CANCER   . CORONARY ARTERY DISEASE   . DIVERTICULOSIS   . DVT   . EROSIVE GASTRITIS   . GERD   . HYPERLIPIDEMIA   . HYPOTHYROIDISM   . Occlusion and stenosis of carotid artery without mention of cerebral infarction   . OSTEOPOROSIS   . RHINOSINUSITIS, RECURRENT      Review of Systems  Constitutional: Negative for fever, fatigue and unexpected weight change.  Respiratory: Negative for shortness of breath.   Cardiovascular: Negative for chest pain.  Genitourinary: Negative for dysuria and pelvic pain.  Musculoskeletal: Positive for back pain. Negative for joint swelling and arthralgias.       Objective:   Physical Exam  BP 120/70  Pulse 56  Temp(Src) 97.3 F (36.3 C) (Oral)  Ht 5\' 6"  (1.676 m)  Wt 147 lb (66.679 kg)  BMI 23.73 kg/m2  SpO2 97% Constitutional: She is well-developed and well-nourished. No distress.  Neck: Normal range of motion. Neck supple. No JVD present. No thyromegaly present.  Cardiovascular:  Normal rate, regular rhythm and normal heart sounds.  No murmur heard. No BLE edema. Pulmonary/Chest: Effort normal and breath sounds normal. No respiratory distress. She has no wheezes.  Abdominal: Soft. Bowel sounds are normal. She exhibits no distension. There is no tenderness. no masses Musculoskeletal: tender over R>L SI joint palpation - Back: full range of motion of thoracic and lumbar spine. Non tender to palpation over vertebra. Positive B straight leg raise. DTR's are symmetrically intact. Sensation intact in all dermatomes of the lower extremities. Full strength to manual muscle testing and walks without difficulty and ambulates with normal gait. Neurological: She is alert and oriented to person, place, and time. No cranial nerve deficit. Coordination normal.  Skin: Skin is warm and dry. No rash noted. No erythema.   Lab Results  Component Value Date   WBC 5.7 12/29/2010   HGB 12.3 12/29/2010   HCT 36.6 12/29/2010   PLT 236.0 12/29/2010   CHOL 161 06/29/2011   TRIG 102.0 06/29/2011   HDL 64.70 06/29/2011   ALT 15 06/29/2011   AST 26 06/29/2011   NA 141 10/26/2010   K 3.4* 10/26/2010   CL 106 10/26/2010   CREATININE 0.7 10/26/2010   BUN 12 10/26/2010   CO2 28 10/26/2010   TSH 1.55 11/11/2010   INR 1.0 ratio 10/26/2010  Assessment & Plan:  SI joint pain R>L and LBP - no radiculopathy on hx or exam, no other red flags - suspect DDD or other arthritis flare Check xray to eval same and rule out compression fx or mets - tx NSAIDs and muscle relaxer (increase meloxicam x3d) Consider PT if xray ok - pt expresses interest in [facility] therapies

## 2011-07-29 NOTE — Telephone Encounter (Signed)
Rx was sent to mail order pharmacy rather than local pharmacy. Rx re-sent per pt request.

## 2011-07-30 ENCOUNTER — Telehealth: Payer: Self-pay

## 2011-07-30 DIAGNOSIS — M533 Sacrococcygeal disorders, not elsewhere classified: Secondary | ICD-10-CM

## 2011-07-30 DIAGNOSIS — M545 Low back pain, unspecified: Secondary | ICD-10-CM

## 2011-07-30 MED ORDER — PREDNISONE (PAK) 10 MG PO TABS: 10.0000 mg | ORAL_TABLET | ORAL | Status: AC

## 2011-07-30 NOTE — Telephone Encounter (Signed)
Notified pt with md response & recommendations.Marland KitchenMarland Kitchen9/14/12@1 :54pm/LMB

## 2011-07-30 NOTE — Telephone Encounter (Signed)
Pt is calling requesting MRI today (based on Xray) and Physical Therapy. Pt says she is having extreme pain and medication prescribed is not helping. Pt also is requesting a STAT appt with Neurology.

## 2011-07-30 NOTE — Telephone Encounter (Signed)
No MRI needed but will have [facility] talk with Dr. Fredrich Birks office to work her in as soon as they can -  start pred pak for pain (erx done) and use with robaxin at night as rx'd yesterday -  Refer to PT also done as pt requests - thanks

## 2011-08-03 ENCOUNTER — Other Ambulatory Visit: Payer: Self-pay | Admitting: Cardiovascular Disease

## 2011-08-04 ENCOUNTER — Ambulatory Visit (INDEPENDENT_AMBULATORY_CARE_PROVIDER_SITE_OTHER): Payer: Medicare Other

## 2011-08-04 DIAGNOSIS — J309 Allergic rhinitis, unspecified: Secondary | ICD-10-CM

## 2011-08-10 ENCOUNTER — Ambulatory Visit (INDEPENDENT_AMBULATORY_CARE_PROVIDER_SITE_OTHER): Payer: Medicare Other

## 2011-08-10 DIAGNOSIS — Z23 Encounter for immunization: Secondary | ICD-10-CM

## 2011-08-10 DIAGNOSIS — J309 Allergic rhinitis, unspecified: Secondary | ICD-10-CM

## 2011-08-12 ENCOUNTER — Telehealth: Payer: Self-pay | Admitting: Internal Medicine

## 2011-08-12 NOTE — Telephone Encounter (Signed)
Pt is calling stating that she cannot get her Protonix refilled. Pt last seen by Willette Cluster NP 11/02/10, colon 4/12. Pt has h/o gerd. Spoke with pt and the mail order pharmacy. The rx cannot be refilled because the rx has expired. Dr. Marina Goodell is it ok to refill the pts protonix or does she need an OV first? Please advise.

## 2011-08-12 NOTE — Telephone Encounter (Signed)
Refill for one year is ok. Thanks

## 2011-08-13 MED ORDER — PANTOPRAZOLE SODIUM 40 MG PO TBEC: 40.0000 mg | DELAYED_RELEASE_TABLET | Freq: Every day | ORAL | Status: DC

## 2011-08-13 NOTE — Telephone Encounter (Signed)
Rx sent to pharmacy. Pt aware.  

## 2011-08-16 ENCOUNTER — Ambulatory Visit (INDEPENDENT_AMBULATORY_CARE_PROVIDER_SITE_OTHER): Payer: Medicare Other

## 2011-08-16 DIAGNOSIS — J309 Allergic rhinitis, unspecified: Secondary | ICD-10-CM

## 2011-08-23 ENCOUNTER — Ambulatory Visit (INDEPENDENT_AMBULATORY_CARE_PROVIDER_SITE_OTHER): Payer: Medicare Other

## 2011-08-23 DIAGNOSIS — J309 Allergic rhinitis, unspecified: Secondary | ICD-10-CM

## 2011-08-25 ENCOUNTER — Other Ambulatory Visit: Payer: Self-pay | Admitting: Dermatology

## 2011-08-31 ENCOUNTER — Ambulatory Visit (INDEPENDENT_AMBULATORY_CARE_PROVIDER_SITE_OTHER): Payer: Medicare Other

## 2011-08-31 DIAGNOSIS — J309 Allergic rhinitis, unspecified: Secondary | ICD-10-CM

## 2011-09-03 ENCOUNTER — Other Ambulatory Visit: Payer: Self-pay | Admitting: Neurosurgery

## 2011-09-03 DIAGNOSIS — M47817 Spondylosis without myelopathy or radiculopathy, lumbosacral region: Secondary | ICD-10-CM

## 2011-09-07 ENCOUNTER — Ambulatory Visit (INDEPENDENT_AMBULATORY_CARE_PROVIDER_SITE_OTHER): Payer: Medicare Other

## 2011-09-07 DIAGNOSIS — J309 Allergic rhinitis, unspecified: Secondary | ICD-10-CM

## 2011-09-09 ENCOUNTER — Ambulatory Visit
Admission: RE | Admit: 2011-09-09 | Discharge: 2011-09-09 | Disposition: A | Discharge status: Home / Self Care | Payer: Medicare Other | Source: Ambulatory Visit | Attending: Neurosurgery | Admitting: Neurosurgery

## 2011-09-09 DIAGNOSIS — M47817 Spondylosis without myelopathy or radiculopathy, lumbosacral region: Secondary | ICD-10-CM

## 2011-09-14 ENCOUNTER — Ambulatory Visit (INDEPENDENT_AMBULATORY_CARE_PROVIDER_SITE_OTHER): Payer: Medicare Other

## 2011-09-14 DIAGNOSIS — J309 Allergic rhinitis, unspecified: Secondary | ICD-10-CM

## 2011-09-22 ENCOUNTER — Ambulatory Visit (INDEPENDENT_AMBULATORY_CARE_PROVIDER_SITE_OTHER): Payer: Medicare Other

## 2011-09-22 DIAGNOSIS — J309 Allergic rhinitis, unspecified: Secondary | ICD-10-CM

## 2011-10-01 ENCOUNTER — Ambulatory Visit (INDEPENDENT_AMBULATORY_CARE_PROVIDER_SITE_OTHER): Payer: Medicare Other

## 2011-10-01 DIAGNOSIS — J309 Allergic rhinitis, unspecified: Secondary | ICD-10-CM

## 2011-10-05 ENCOUNTER — Ambulatory Visit (INDEPENDENT_AMBULATORY_CARE_PROVIDER_SITE_OTHER): Payer: Medicare Other

## 2011-10-05 DIAGNOSIS — J309 Allergic rhinitis, unspecified: Secondary | ICD-10-CM

## 2011-10-13 ENCOUNTER — Ambulatory Visit (INDEPENDENT_AMBULATORY_CARE_PROVIDER_SITE_OTHER): Payer: Medicare Other

## 2011-10-13 DIAGNOSIS — J309 Allergic rhinitis, unspecified: Secondary | ICD-10-CM

## 2011-10-18 ENCOUNTER — Encounter: Payer: Self-pay | Admitting: Internal Medicine

## 2011-10-19 ENCOUNTER — Ambulatory Visit (INDEPENDENT_AMBULATORY_CARE_PROVIDER_SITE_OTHER): Payer: Medicare Other

## 2011-10-19 ENCOUNTER — Telehealth: Payer: Self-pay | Admitting: Cardiovascular Disease

## 2011-10-19 DIAGNOSIS — J309 Allergic rhinitis, unspecified: Secondary | ICD-10-CM

## 2011-10-19 NOTE — Telephone Encounter (Signed)
Pt wants to know if needs lab work °

## 2011-10-19 NOTE — Telephone Encounter (Signed)
This pt just had lipids checked in August and they were normal.  This should not need to be checked again until August 2013.  I left a message on the pt's voicemail with this information.  I also left instructions that the pt can come into her February appointment fasting in the case that labs are needed.

## 2011-10-25 ENCOUNTER — Ambulatory Visit (INDEPENDENT_AMBULATORY_CARE_PROVIDER_SITE_OTHER): Payer: Medicare Other

## 2011-10-25 DIAGNOSIS — J309 Allergic rhinitis, unspecified: Secondary | ICD-10-CM

## 2011-11-01 ENCOUNTER — Ambulatory Visit (INDEPENDENT_AMBULATORY_CARE_PROVIDER_SITE_OTHER): Payer: Medicare Other

## 2011-11-01 DIAGNOSIS — J309 Allergic rhinitis, unspecified: Secondary | ICD-10-CM

## 2011-11-10 ENCOUNTER — Ambulatory Visit (INDEPENDENT_AMBULATORY_CARE_PROVIDER_SITE_OTHER): Payer: Medicare Other

## 2011-11-10 DIAGNOSIS — J309 Allergic rhinitis, unspecified: Secondary | ICD-10-CM

## 2011-11-11 ENCOUNTER — Ambulatory Visit: Payer: Medicare Other | Admitting: Internal Medicine

## 2011-11-11 ENCOUNTER — Ambulatory Visit (INDEPENDENT_AMBULATORY_CARE_PROVIDER_SITE_OTHER): Payer: Medicare Other | Admitting: Internal Medicine

## 2011-11-11 ENCOUNTER — Encounter: Payer: Self-pay | Admitting: Internal Medicine

## 2011-11-11 DIAGNOSIS — M545 Low back pain, unspecified: Secondary | ICD-10-CM

## 2011-11-11 DIAGNOSIS — D179 Benign lipomatous neoplasm, unspecified: Secondary | ICD-10-CM

## 2011-11-11 DIAGNOSIS — J329 Chronic sinusitis, unspecified: Secondary | ICD-10-CM

## 2011-11-11 DIAGNOSIS — L821 Other seborrheic keratosis: Secondary | ICD-10-CM

## 2011-11-11 MED ORDER — FLUTICASONE PROPIONATE 50 MCG/ACT NA SUSP: 2.0000 | Freq: Every day | NASAL | Status: DC

## 2011-11-11 MED ORDER — PREDNISONE (PAK) 10 MG PO TABS: 10.0000 mg | ORAL_TABLET | ORAL | Status: AC

## 2011-11-11 MED ORDER — LEVOTHYROXINE SODIUM 75 MCG PO TABS: 75.0000 ug | ORAL_TABLET | Freq: Every day | ORAL | Status: DC

## 2011-11-11 MED ORDER — ZOLPIDEM TARTRATE 10 MG PO TABS: 5.0000 mg | ORAL_TABLET | Freq: Every evening | ORAL | Status: DC | PRN

## 2011-11-11 NOTE — Assessment & Plan Note (Signed)
Follows with pulm for same Feels Astelin ineffective symptoms relief - tx pred taper x 6 days for acute inflammation and start flonase - erx done No infx so hold antibiotics

## 2011-11-11 NOTE — Patient Instructions (Signed)
It was good to see you today. Use prednisone taper over 6 days and change no spray to Flonase for allergy and sinus symptoms - Your prescription(s) have been submitted to your pharmacy. Please take as directed and contact our office if you believe you are having problem(s) with the medication(s). If you develop worsening symptoms or fever, call and we can reconsider antibiotics, but it does not appear necessary to use antibiotics at this time. Will refer you to new and different physical therapist for your back pain symptoms as discussed - our office will call you with these details once arranged Continue to work with Dr. Venetia Maxon as needed; also Dr. Maple Hudson Skin spots on your back are normal, no evidence of cancer Knot in right buttock is a simple cyst, no evidence for cancer

## 2011-11-11 NOTE — Progress Notes (Signed)
Subjective:    Patient ID: [name], female    DOB: [birth year], 70 y.o.   MRN: [id number]  HPI  [name] is a 70 y.o. female who presents for evaluation of sinus pain. Symptoms include: cough, mild headaches, mouth breathing, nasal congestion, post nasal drip. Onset of symptoms was 6 weeks ago. Symptoms have been gradually worsening since that time. Past history is significant for asthma and occasional episodes of bronchitis. Patient is a non-smoker. Only relief is with OTC Afrin - prior use of Zyrtec or Astelin spray ineffective - known chronic sinusitis and works with pulmonary on same  Also concern for moles on back - recent evaluation by dermatology "unremarkable"the patient would like second opinion on same  Also complains of continued back pain symptoms - working with neurosurgery Dr. Venetia Maxon on same. Has been recommended surgical intervention the patient declines surgery. Working with physical therapist but would like second opinion  Also complains of "knot" located in right buttock. - No change in size, minimally tender to palpation directly - no dimpling or overlying skin changes - no history of trauma  Also reviewed chronic med issues today -   CAD - s/p CABG 1997 - reports compliance with ongoing medical treatment and no changes in medication dose or frequency. denies adverse side effects related to current therapy. no angina or DOE, occasional chest pain s/p cath due to same (10/2010 - no obstructive disease)   GERD - follows with GI for same - daily symptoms prior to nexium - changed to alt PPI (pantoprotazole) July 2012 because of cost concerns- no abd pain - no change in bowels -  hypothyroid, hx graves dz - reports compliance with ongoing medical treatment and no changes in medication dose or frequency. denies adverse side effects related to current therapy.     osteoporosis - on bisphos tx >36yr - no hx fx, no back or bone pain - concerned abpout taking med >51yr - also  ?sticking sensation when swallowing small solids like pills- told may be related t bisphos tx - no dysphagia, no regurg - sticking ongong >4yrs   remote breast ca 1991, B mastect related to same - annual mammo w/o abn   dyslipidemia- reports compliance with ongoing medical treatment and no changes in medication dose or frequency. denies adverse side effects related to current therapy. no myalgia or weakness   Past Medical History  Diagnosis Date  . Hypertension   . COPD (chronic obstructive pulmonary disease)   . Osteopenia   . Aortic stenosis, moderate   . History of DVT (deep vein thrombosis)   . Coronary artery disease     s/p CABGx5 2007  . ALLERGIC RHINITIS   . ASTHMA   . BREAST CANCER     left, 1991; B mastectomy  . CORONARY ARTERY DISEASE   . DIVERTICULOSIS   . DVT   . EROSIVE GASTRITIS   . GERD   . HYPERLIPIDEMIA   . HYPOTHYROIDISM   . Occlusion and stenosis of carotid artery without mention of cerebral infarction   . OSTEOPOROSIS   . RHINOSINUSITIS, RECURRENT     Review of Systems  Constitutional: Positive for fatigue. Negative for fever and chills.  HENT: Positive for congestion, rhinorrhea and postnasal drip. Negative for nosebleeds, sneezing and sinus pressure.        Objective:   Physical Exam BP 126/94  Pulse 57  Temp(Src) 97.7 F (36.5 C) (Oral)  Wt 144 lb (65.318 kg)  SpO2 99% Wt Readings  from Last 3 Encounters:  11/11/11 144 lb (65.318 kg)  07/29/11 147 lb (66.679 kg)  06/29/11 140 lb 12.8 oz (63.866 kg)   Constitutional: She appears well-developed and well-nourished. No distress.  HENT: Head: Normocephalic and atraumatic. Ears: B TMs ok, no erythema or effusion; Nose: Nose with clear rhinorrhea, pertinent swelling and congestion but no ulceration or purulence discharge. Sinuses nontender. Mouth/Throat: Oropharynx is clear and moist. No oropharyngeal exudate.  Eyes: Conjunctivae and EOM are normal. Pupils are equal, round, and reactive to light. No  scleral icterus.  Neck: Normal range of motion. Neck supple. No JVD present. No thyromegaly present.  Cardiovascular: Normal rate, regular rhythm and normal heart sounds.  No murmur heard. No BLE edema. Pulmonary/Chest: Effort normal and breath sounds normal. No respiratory distress. She has no wheezes.  Mskel - benign 1-1/2 cm round lipoma versus cyst within tissue of right buttock Skin - benign SK changes - follows with derm for same  Lab Results  Component Value Date   WBC 5.7 12/29/2010   HGB 12.3 12/29/2010   HCT 36.6 12/29/2010   PLT 236.0 12/29/2010   GLUCOSE 92 10/26/2010   CHOL 161 06/29/2011   TRIG 102.0 06/29/2011   HDL 64.70 06/29/2011   LDLCALC 76 06/29/2011   ALT 15 06/29/2011   AST 26 06/29/2011   NA 141 10/26/2010   K 3.4* 10/26/2010   CL 106 10/26/2010   CREATININE 0.7 10/26/2010   BUN 12 10/26/2010   CO2 28 10/26/2010   TSH 1.55 11/11/2010   INR 1.0 ratio 10/26/2010       Assessment & Plan:  See problem list. Medications and labs reviewed today.  Lipoma, right buttock - reassurance provided  seborrheic keratosis - recent dermatology evaluation for same also with benign findings - reassurance provided  Chronic low back pain with radiculopathy - refer to alternate physical therapist for second opinion. Continue to follow with neurosurgery as recommended and consider ESI or microdiscectomy if indicated/unresponsive to conservative therapy

## 2011-11-18 ENCOUNTER — Ambulatory Visit (INDEPENDENT_AMBULATORY_CARE_PROVIDER_SITE_OTHER): Payer: Medicare Other

## 2011-11-18 ENCOUNTER — Telehealth: Payer: Self-pay

## 2011-11-18 DIAGNOSIS — J309 Allergic rhinitis, unspecified: Secondary | ICD-10-CM

## 2011-11-18 NOTE — Telephone Encounter (Signed)
Pt called requesting call to [facility] [phone] Ref [phone] regarding recent refills.

## 2011-11-22 DIAGNOSIS — M545 Low back pain, unspecified: Secondary | ICD-10-CM | POA: Diagnosis not present

## 2011-11-22 DIAGNOSIS — M62838 Other muscle spasm: Secondary | ICD-10-CM | POA: Diagnosis not present

## 2011-11-23 NOTE — Telephone Encounter (Signed)
Per [facility] pharmacy, clarification needed or requested on this pt's behalf.

## 2011-11-24 ENCOUNTER — Ambulatory Visit (INDEPENDENT_AMBULATORY_CARE_PROVIDER_SITE_OTHER): Payer: Medicare Other

## 2011-11-24 DIAGNOSIS — M545 Low back pain, unspecified: Secondary | ICD-10-CM | POA: Diagnosis not present

## 2011-11-24 DIAGNOSIS — J309 Allergic rhinitis, unspecified: Secondary | ICD-10-CM

## 2011-11-24 DIAGNOSIS — M62838 Other muscle spasm: Secondary | ICD-10-CM | POA: Diagnosis not present

## 2011-11-26 ENCOUNTER — Telehealth: Payer: Self-pay

## 2011-11-26 MED ORDER — ZOLPIDEM TARTRATE 10 MG PO TABS: 5.0000 mg | ORAL_TABLET | Freq: Every evening | ORAL | Status: DC | PRN

## 2011-11-26 NOTE — Telephone Encounter (Signed)
Faxed script to [facility]..Marland Kitchen1/11/13@12 :00pm/LMB

## 2011-11-26 NOTE — Telephone Encounter (Signed)
Pt requests refill to [facility]

## 2011-11-29 DIAGNOSIS — M62838 Other muscle spasm: Secondary | ICD-10-CM | POA: Diagnosis not present

## 2011-11-29 DIAGNOSIS — M545 Low back pain, unspecified: Secondary | ICD-10-CM | POA: Diagnosis not present

## 2011-12-01 ENCOUNTER — Ambulatory Visit (INDEPENDENT_AMBULATORY_CARE_PROVIDER_SITE_OTHER): Payer: Medicare Other

## 2011-12-01 DIAGNOSIS — J309 Allergic rhinitis, unspecified: Secondary | ICD-10-CM

## 2011-12-01 DIAGNOSIS — M545 Low back pain, unspecified: Secondary | ICD-10-CM | POA: Diagnosis not present

## 2011-12-01 DIAGNOSIS — M62838 Other muscle spasm: Secondary | ICD-10-CM | POA: Diagnosis not present

## 2011-12-06 DIAGNOSIS — M545 Low back pain, unspecified: Secondary | ICD-10-CM | POA: Diagnosis not present

## 2011-12-06 DIAGNOSIS — M62838 Other muscle spasm: Secondary | ICD-10-CM | POA: Diagnosis not present

## 2011-12-08 ENCOUNTER — Ambulatory Visit (INDEPENDENT_AMBULATORY_CARE_PROVIDER_SITE_OTHER): Payer: Medicare Other

## 2011-12-08 DIAGNOSIS — M545 Low back pain, unspecified: Secondary | ICD-10-CM | POA: Diagnosis not present

## 2011-12-08 DIAGNOSIS — M62838 Other muscle spasm: Secondary | ICD-10-CM | POA: Diagnosis not present

## 2011-12-08 DIAGNOSIS — J309 Allergic rhinitis, unspecified: Secondary | ICD-10-CM

## 2011-12-09 ENCOUNTER — Ambulatory Visit (INDEPENDENT_AMBULATORY_CARE_PROVIDER_SITE_OTHER): Payer: Medicare Other

## 2011-12-09 DIAGNOSIS — J309 Allergic rhinitis, unspecified: Secondary | ICD-10-CM

## 2011-12-10 DIAGNOSIS — M545 Low back pain, unspecified: Secondary | ICD-10-CM | POA: Diagnosis not present

## 2011-12-10 DIAGNOSIS — M62838 Other muscle spasm: Secondary | ICD-10-CM | POA: Diagnosis not present

## 2011-12-13 DIAGNOSIS — M545 Low back pain, unspecified: Secondary | ICD-10-CM | POA: Diagnosis not present

## 2011-12-13 DIAGNOSIS — M62838 Other muscle spasm: Secondary | ICD-10-CM | POA: Diagnosis not present

## 2011-12-14 ENCOUNTER — Ambulatory Visit (INDEPENDENT_AMBULATORY_CARE_PROVIDER_SITE_OTHER): Payer: Medicare Other

## 2011-12-14 DIAGNOSIS — J309 Allergic rhinitis, unspecified: Secondary | ICD-10-CM

## 2011-12-15 DIAGNOSIS — M62838 Other muscle spasm: Secondary | ICD-10-CM | POA: Diagnosis not present

## 2011-12-15 DIAGNOSIS — M545 Low back pain, unspecified: Secondary | ICD-10-CM | POA: Diagnosis not present

## 2011-12-16 DIAGNOSIS — M545 Low back pain, unspecified: Secondary | ICD-10-CM | POA: Diagnosis not present

## 2011-12-16 DIAGNOSIS — M62838 Other muscle spasm: Secondary | ICD-10-CM | POA: Diagnosis not present

## 2011-12-20 DIAGNOSIS — M545 Low back pain, unspecified: Secondary | ICD-10-CM | POA: Diagnosis not present

## 2011-12-20 DIAGNOSIS — M4120 Other idiopathic scoliosis, site unspecified: Secondary | ICD-10-CM | POA: Diagnosis not present

## 2011-12-20 DIAGNOSIS — M62838 Other muscle spasm: Secondary | ICD-10-CM | POA: Diagnosis not present

## 2011-12-21 ENCOUNTER — Ambulatory Visit (INDEPENDENT_AMBULATORY_CARE_PROVIDER_SITE_OTHER): Payer: Medicare Other

## 2011-12-21 DIAGNOSIS — J309 Allergic rhinitis, unspecified: Secondary | ICD-10-CM

## 2011-12-22 DIAGNOSIS — M545 Low back pain, unspecified: Secondary | ICD-10-CM | POA: Diagnosis not present

## 2011-12-22 DIAGNOSIS — M62838 Other muscle spasm: Secondary | ICD-10-CM | POA: Diagnosis not present

## 2011-12-23 ENCOUNTER — Encounter: Payer: Self-pay | Admitting: Cardiovascular Disease

## 2011-12-23 ENCOUNTER — Ambulatory Visit (INDEPENDENT_AMBULATORY_CARE_PROVIDER_SITE_OTHER): Payer: Medicare Other | Admitting: Cardiovascular Disease

## 2011-12-23 VITALS — BP 122/58 | HR 54 | Ht 65.0 in | Wt 141.0 lb

## 2011-12-23 DIAGNOSIS — I2510 Atherosclerotic heart disease of native coronary artery without angina pectoris: Secondary | ICD-10-CM

## 2011-12-23 DIAGNOSIS — E785 Hyperlipidemia, unspecified: Secondary | ICD-10-CM | POA: Diagnosis not present

## 2011-12-23 DIAGNOSIS — E7800 Pure hypercholesterolemia, unspecified: Secondary | ICD-10-CM | POA: Diagnosis not present

## 2011-12-23 DIAGNOSIS — I359 Nonrheumatic aortic valve disorder, unspecified: Secondary | ICD-10-CM

## 2011-12-23 LAB — HEPATIC FUNCTION PANEL
Albumin: 3.8 g/dL (ref 3.5–5.2)
Total Protein: 6.6 g/dL (ref 6.0–8.3)

## 2011-12-23 LAB — LIPID PANEL
Cholesterol: 138 mg/dL (ref 0–200)
HDL: 53.5 mg/dL (ref >39.00)
LDL Cholesterol: 76 mg/dL (ref 0–99)
Total CHOL/HDL Ratio: 3
Triglycerides: 43 mg/dL (ref 0.0–149.0)

## 2011-12-23 NOTE — Progress Notes (Signed)
HPI:  71 year old woman presented for followup evaluation. She were to coronary bypass surgery in 2007 after presenting with non-ST elevation infarction. Heart catheterization in 2011 demonstrated patency of her bypass grafts. This was done for evaluation of chest pain. The patient has preserved LV function. She presents today for routine followup.  The patient has mild carotid disease. Her last carotid duplex scan in August 2012 showed mild bilateral disease and 2 year followup was recommended.  Most recent lipids showed cholesterol of 161, triglycerides 102, HDL 65, and LDL 76.  Overall the patient is feeling well. She is walking 1 mile on a regular basis for exercise. She is limited somewhat by low back pain. She denies chest pain, dyspnea, edema, palpitations, lightheadedness, or syncope.  Outpatient Encounter Prescriptions as of 12/23/2011  Medication Sig Dispense Refill  . aspirin 81 MG tablet Take 81 mg by mouth daily.        Marland Kitchen atorvastatin (LIPITOR) 20 MG tablet Take 1 tablet (20 mg total) by mouth daily.  90 tablet  3  . Calcium Carbonate (CALTRATE 600 PO) Take 2 tablets by mouth daily.        . Coenzyme Q10 (CO Q-10 PO) Take 50 mg by mouth daily.        . fluticasone (FLONASE) 50 MCG/ACT nasal spray Place 2 sprays into the nose daily as needed.      Marland Kitchen levothyroxine (LEVOXYL) 75 MCG tablet Take 1 tablet (75 mcg total) by mouth daily.  90 tablet  1  . loratadine (CLARITIN) 10 MG tablet Take 10 mg by mouth daily.       . meloxicam (MOBIC) 15 MG tablet Take 15 mg by mouth daily. TAke 1/4 tablet or as directed      . metoprolol tartrate (LOPRESSOR) 25 MG tablet TAKE 1 TABLET TWICE A DAY  180 tablet  2  . multivitamin (THERAGRAN) per tablet Take 1 tablet by mouth daily.        . pantoprazole (PROTONIX) 40 MG tablet Take 40 mg by mouth daily.        Marland Kitchen senna-docusate (SENOKOT-S) 8.6-50 MG per tablet Take 1 tablet by mouth daily.        Marland Kitchen zolpidem (AMBIEN) 10 MG tablet Take 5 mg by mouth at  bedtime as needed.        Marland Kitchen DISCONTD: fluticasone (FLONASE) 50 MCG/ACT nasal spray Place 2 sprays into the nose daily.  16 g  2  . DISCONTD: pantoprazole (PROTONIX) 40 MG tablet Take 1 tablet (40 mg total) by mouth daily.  90 tablet  3  . DISCONTD: zolpidem (AMBIEN) 10 MG tablet Take 0.5 tablets (5 mg total) by mouth at bedtime as needed. Take 1/2-1 tab at bedtime as needed  45 tablet  1    Allergies  Allergen Reactions  . Clarithromycin   . Codeine     REACTION: nausea  . Levofloxacin     REACTION: nausea  . Morphine   . Naproxen     REACTION: nausea  . Penicillins     REACTION: RASH    Past Medical History  Diagnosis Date  . Hypertension   . COPD (chronic obstructive pulmonary disease)   . Osteopenia   . Aortic stenosis, moderate   . History of DVT (deep vein thrombosis)   . Coronary artery disease     s/p CABGx5 2007  . ALLERGIC RHINITIS   . ASTHMA   . BREAST CANCER     left, 1991; B mastectomy  .  CORONARY ARTERY DISEASE   . DIVERTICULOSIS   . DVT   . EROSIVE GASTRITIS   . GERD   . HYPERLIPIDEMIA   . HYPOTHYROIDISM   . Occlusion and stenosis of carotid artery without mention of cerebral infarction   . OSTEOPOROSIS   . RHINOSINUSITIS, RECURRENT     ROS: Negative except as per HPI  BP 122/58  Pulse 54  Ht 5\' 5"  (1.651 m)  Wt 63.957 kg (141 lb)  BMI 23.46 kg/m2  PHYSICAL EXAM: Pt is alert and oriented, NAD HEENT: normal Neck: JVP - normal, carotids 2+= with bilateral bruits Lungs: CTA bilaterally CV: RRR with grade 2/6 systolic murmur at the right upper sternal border Abd: soft, NT, Positive BS, no hepatomegaly Ext: no C/C/E, distal pulses intact and equal Skin: warm/dry no rash  EKG:  Sinus bradycardia 54 beats per minute, cannot rule out anterior infarct age undetermined.  ASSESSMENT AND PLAN:

## 2011-12-23 NOTE — Patient Instructions (Signed)
Your physician wants you to follow-up in: 6 MONTHS. You will receive a reminder letter in the mail two months in advance. If you don't receive a letter, please call our office to schedule the follow-up appointment.  Your physician recommends that you have a FASTING lipid and liver profile today.  Your physician recommends that you continue on your current medications as directed. Please refer to the Current Medication list given to you today.

## 2011-12-24 DIAGNOSIS — M62838 Other muscle spasm: Secondary | ICD-10-CM | POA: Diagnosis not present

## 2011-12-24 DIAGNOSIS — M545 Low back pain, unspecified: Secondary | ICD-10-CM | POA: Diagnosis not present

## 2011-12-27 ENCOUNTER — Ambulatory Visit (INDEPENDENT_AMBULATORY_CARE_PROVIDER_SITE_OTHER): Payer: Medicare Other

## 2011-12-27 DIAGNOSIS — M545 Low back pain, unspecified: Secondary | ICD-10-CM | POA: Diagnosis not present

## 2011-12-27 DIAGNOSIS — J309 Allergic rhinitis, unspecified: Secondary | ICD-10-CM | POA: Diagnosis not present

## 2011-12-27 DIAGNOSIS — M62838 Other muscle spasm: Secondary | ICD-10-CM | POA: Diagnosis not present

## 2011-12-27 NOTE — Assessment & Plan Note (Signed)
The patient has a murmur of aortic stenosis but I reviewed her cardiac catheter findings from 2011 and there really is no significant gradient across the aortic valve. The measured gradient was 6 mm mercury peak to peak.

## 2011-12-27 NOTE — Assessment & Plan Note (Signed)
Lipids reviewed and they are at goal. The patient is on atorvastatin 20 mg daily. Will continue.

## 2011-12-27 NOTE — Assessment & Plan Note (Signed)
The patient is stable without angina. She has undergone cardiac catheterization following coronary bypass surgery and this showed patency of all of her grafts. She will continue on her current medical program. I encouraged her to increase her walking distance from one mile to 2 miles daily.

## 2011-12-28 DIAGNOSIS — L821 Other seborrheic keratosis: Secondary | ICD-10-CM | POA: Diagnosis not present

## 2011-12-28 DIAGNOSIS — L659 Nonscarring hair loss, unspecified: Secondary | ICD-10-CM | POA: Diagnosis not present

## 2011-12-29 DIAGNOSIS — M545 Low back pain, unspecified: Secondary | ICD-10-CM | POA: Diagnosis not present

## 2011-12-29 DIAGNOSIS — M62838 Other muscle spasm: Secondary | ICD-10-CM | POA: Diagnosis not present

## 2011-12-30 ENCOUNTER — Telehealth: Payer: Self-pay | Admitting: Cardiovascular Disease

## 2011-12-30 NOTE — Telephone Encounter (Signed)
I spoke with the pt and made her aware of lab results.

## 2011-12-30 NOTE — Telephone Encounter (Signed)
FU Call: Pt returning call from our office from yesterday. Please return pt call to discuss further.

## 2012-01-03 ENCOUNTER — Ambulatory Visit (INDEPENDENT_AMBULATORY_CARE_PROVIDER_SITE_OTHER): Payer: Medicare Other

## 2012-01-03 DIAGNOSIS — M62838 Other muscle spasm: Secondary | ICD-10-CM | POA: Diagnosis not present

## 2012-01-03 DIAGNOSIS — J309 Allergic rhinitis, unspecified: Secondary | ICD-10-CM

## 2012-01-03 DIAGNOSIS — M545 Low back pain, unspecified: Secondary | ICD-10-CM | POA: Diagnosis not present

## 2012-01-05 DIAGNOSIS — M62838 Other muscle spasm: Secondary | ICD-10-CM | POA: Diagnosis not present

## 2012-01-05 DIAGNOSIS — M545 Low back pain, unspecified: Secondary | ICD-10-CM | POA: Diagnosis not present

## 2012-01-06 DIAGNOSIS — M545 Low back pain, unspecified: Secondary | ICD-10-CM | POA: Diagnosis not present

## 2012-01-06 DIAGNOSIS — M62838 Other muscle spasm: Secondary | ICD-10-CM | POA: Diagnosis not present

## 2012-01-10 ENCOUNTER — Ambulatory Visit (INDEPENDENT_AMBULATORY_CARE_PROVIDER_SITE_OTHER): Payer: Medicare Other

## 2012-01-10 DIAGNOSIS — J309 Allergic rhinitis, unspecified: Secondary | ICD-10-CM

## 2012-01-10 DIAGNOSIS — M545 Low back pain, unspecified: Secondary | ICD-10-CM | POA: Diagnosis not present

## 2012-01-10 DIAGNOSIS — M62838 Other muscle spasm: Secondary | ICD-10-CM | POA: Diagnosis not present

## 2012-01-13 ENCOUNTER — Encounter: Payer: Self-pay | Admitting: Internal Medicine

## 2012-01-13 DIAGNOSIS — M545 Low back pain, unspecified: Secondary | ICD-10-CM | POA: Diagnosis not present

## 2012-01-13 DIAGNOSIS — M62838 Other muscle spasm: Secondary | ICD-10-CM | POA: Diagnosis not present

## 2012-01-17 ENCOUNTER — Ambulatory Visit (INDEPENDENT_AMBULATORY_CARE_PROVIDER_SITE_OTHER): Payer: Medicare Other

## 2012-01-17 DIAGNOSIS — J309 Allergic rhinitis, unspecified: Secondary | ICD-10-CM

## 2012-01-17 DIAGNOSIS — M62838 Other muscle spasm: Secondary | ICD-10-CM | POA: Diagnosis not present

## 2012-01-17 DIAGNOSIS — M545 Low back pain, unspecified: Secondary | ICD-10-CM | POA: Diagnosis not present

## 2012-01-19 DIAGNOSIS — M545 Low back pain, unspecified: Secondary | ICD-10-CM | POA: Diagnosis not present

## 2012-01-19 DIAGNOSIS — M62838 Other muscle spasm: Secondary | ICD-10-CM | POA: Diagnosis not present

## 2012-01-26 ENCOUNTER — Ambulatory Visit (INDEPENDENT_AMBULATORY_CARE_PROVIDER_SITE_OTHER): Payer: Medicare Other

## 2012-01-26 DIAGNOSIS — J309 Allergic rhinitis, unspecified: Secondary | ICD-10-CM | POA: Diagnosis not present

## 2012-02-02 ENCOUNTER — Ambulatory Visit (INDEPENDENT_AMBULATORY_CARE_PROVIDER_SITE_OTHER): Payer: Medicare Other

## 2012-02-02 DIAGNOSIS — J309 Allergic rhinitis, unspecified: Secondary | ICD-10-CM | POA: Diagnosis not present

## 2012-02-10 ENCOUNTER — Ambulatory Visit (INDEPENDENT_AMBULATORY_CARE_PROVIDER_SITE_OTHER): Payer: Medicare Other

## 2012-02-10 DIAGNOSIS — J309 Allergic rhinitis, unspecified: Secondary | ICD-10-CM

## 2012-02-16 ENCOUNTER — Ambulatory Visit (INDEPENDENT_AMBULATORY_CARE_PROVIDER_SITE_OTHER): Payer: Medicare Other

## 2012-02-16 DIAGNOSIS — J309 Allergic rhinitis, unspecified: Secondary | ICD-10-CM

## 2012-02-21 ENCOUNTER — Ambulatory Visit (INDEPENDENT_AMBULATORY_CARE_PROVIDER_SITE_OTHER): Payer: Medicare Other

## 2012-02-21 DIAGNOSIS — J309 Allergic rhinitis, unspecified: Secondary | ICD-10-CM | POA: Diagnosis not present

## 2012-03-02 ENCOUNTER — Ambulatory Visit (INDEPENDENT_AMBULATORY_CARE_PROVIDER_SITE_OTHER): Payer: Medicare Other

## 2012-03-02 DIAGNOSIS — J309 Allergic rhinitis, unspecified: Secondary | ICD-10-CM | POA: Diagnosis not present

## 2012-03-07 ENCOUNTER — Ambulatory Visit (INDEPENDENT_AMBULATORY_CARE_PROVIDER_SITE_OTHER): Payer: Medicare Other

## 2012-03-07 DIAGNOSIS — J309 Allergic rhinitis, unspecified: Secondary | ICD-10-CM

## 2012-03-13 ENCOUNTER — Ambulatory Visit (INDEPENDENT_AMBULATORY_CARE_PROVIDER_SITE_OTHER): Payer: Medicare Other

## 2012-03-13 DIAGNOSIS — J309 Allergic rhinitis, unspecified: Secondary | ICD-10-CM | POA: Diagnosis not present

## 2012-03-21 ENCOUNTER — Ambulatory Visit (INDEPENDENT_AMBULATORY_CARE_PROVIDER_SITE_OTHER): Payer: Medicare Other | Admitting: Internal Medicine

## 2012-03-21 DIAGNOSIS — J45909 Unspecified asthma, uncomplicated: Secondary | ICD-10-CM

## 2012-03-21 MED ORDER — OMALIZUMAB 150 MG ~~LOC~~ SOLR
150.0000 mg | Freq: Once | SUBCUTANEOUS | Status: AC
  Administered 2012-03-21: 150 mg via SUBCUTANEOUS

## 2012-03-21 NOTE — Progress Notes (Signed)
I went to cover allergy for lunch; Xolair was mixing in treatment room, I did not hear from [facility] that the Xolair was for another patient. [name] entered the room and I was under the impression that the Xolair was for this patient. I failed to ask patient what type of injection she was here for. I gave 1.49ml of Xolair in her right deltoid. I then realized that I gave the wrong injection to the patient. I reacted quickly and had patient take a seat in the treament room and explained that I had just given her the wrong medication. I got Tammy Scott from the breakroom to watch patient so I could inform Location manager of the incident. Philipp Deputy gathered the needed information and spoke in detail with patient about the incident; we also called Dr Maple Hudson, who was on his way back to the office from lunch; he came in and spoke in great detail to patient about the type of medication that she had gotten and what the reactions could be. Pt was kept in our office for approximately 40 minutes from time of injection without any reactions. Dr Maple Hudson felt patient was safe enough to leave with the understanding that she was to call the office if any questions/concerns and that we would also keep in touch with the patient as to how she was feeling.   Pt was understanding of the incident and stated everyone is human, she was thankful for how we handle the situation. Pt was grateful for her questions and concerns being addressed by Dr Maple Hudson.   I spoke with patient around 4pm today to check on her; she stated she felt great and no need to apologize again as we are all human. I thanked her for being so kind, understanding, and for continuing to trust her healthcare with our office.

## 2012-03-26 NOTE — Patient Instructions (Signed)
Call office with any concerns. Keep planned office follow up appointment. Resume ordinary allergy vaccine schedule next injection.

## 2012-03-26 NOTE — Assessment & Plan Note (Signed)
Event documentation. Xolair injection given instead of intended allergy vaccine. I examined the patient and discussed the event, answering questions. Exam was unremarkable, vital signs appropriate in no adverse medical consequences identified. Appropriate instructions were given and she is scheduled for office followup with me.

## 2012-03-29 ENCOUNTER — Ambulatory Visit (INDEPENDENT_AMBULATORY_CARE_PROVIDER_SITE_OTHER)
Admission: RE | Admit: 2012-03-29 | Discharge: 2012-03-29 | Disposition: A | Discharge status: Home / Self Care | Payer: Medicare Other | Source: Ambulatory Visit | Attending: Internal Medicine | Admitting: Internal Medicine

## 2012-03-29 ENCOUNTER — Other Ambulatory Visit (INDEPENDENT_AMBULATORY_CARE_PROVIDER_SITE_OTHER): Payer: Medicare Other

## 2012-03-29 ENCOUNTER — Ambulatory Visit (INDEPENDENT_AMBULATORY_CARE_PROVIDER_SITE_OTHER): Payer: Medicare Other

## 2012-03-29 ENCOUNTER — Ambulatory Visit
Admission: RE | Admit: 2012-03-29 | Discharge: 2012-03-29 | Disposition: A | Discharge status: Home / Self Care | Payer: Medicare Other | Source: Ambulatory Visit | Attending: Internal Medicine | Admitting: Internal Medicine

## 2012-03-29 ENCOUNTER — Ambulatory Visit (INDEPENDENT_AMBULATORY_CARE_PROVIDER_SITE_OTHER): Payer: Medicare Other | Admitting: Internal Medicine

## 2012-03-29 ENCOUNTER — Encounter: Payer: Self-pay | Admitting: Internal Medicine

## 2012-03-29 VITALS — BP 140/72 | HR 56 | Temp 97.3°F | Ht 65.0 in | Wt 142.1 lb

## 2012-03-29 DIAGNOSIS — R1032 Left lower quadrant pain: Secondary | ICD-10-CM

## 2012-03-29 DIAGNOSIS — J309 Allergic rhinitis, unspecified: Secondary | ICD-10-CM

## 2012-03-29 DIAGNOSIS — E039 Hypothyroidism, unspecified: Secondary | ICD-10-CM

## 2012-03-29 DIAGNOSIS — M810 Age-related osteoporosis without current pathological fracture: Secondary | ICD-10-CM | POA: Diagnosis not present

## 2012-03-29 LAB — TSH: TSH: 0.98 u[IU]/mL (ref 0.35–5.50)

## 2012-03-29 NOTE — Patient Instructions (Addendum)
It was good to see you today. Test(s) ordered today. Your results will be called to you after review (48-72hours after test completion). If any changes need to be made, you will be notified at that time. Medications reviewed, no changes at this time. Please schedule followup in 3-4 months, call sooner if problems.  

## 2012-03-29 NOTE — Assessment & Plan Note (Signed)
Lab Results  Component Value Date   TSH 1.55 11/11/2010  recheck now The current medical regimen is effective;  continue present plan and medications.

## 2012-03-29 NOTE — Assessment & Plan Note (Signed)
Stopped fosamax 2012 after >41yr tx DEXA 03/2011: -1.8 R fem

## 2012-03-29 NOTE — Assessment & Plan Note (Signed)
No evidence for sinus infection - hold antibiotics today, call if worse The current medical regimen is effective;  continue present plan and medications.

## 2012-03-29 NOTE — Progress Notes (Signed)
Subjective:    Patient ID: [name], female    DOB: 1940/12/24, 71 y.o.   MRN: [id number]  Sinusitis Associated symptoms include congestion. Pertinent negatives include no chills, sinus pressure or sneezing.    complains of accidental fall 10 days ago - no LOC - immediate pain in R foot (mild brusie, no swelling), now improved - but increasing L groin pain, especially while sitting or walking  Also reviewed chronic med issues today -   CAD - s/p CABG 1997 - reports compliance with ongoing medical treatment and no changes in medication dose or frequency. denies adverse side effects related to current therapy. no angina or DOE, occasional chest pain s/p cath due to same (10/2010 - no obstructive disease)   GERD - follows with GI for same - daily symptoms prior to nexium - changed to alt PPI (pantoprotazole) July 2012 because of cost concerns- no abd pain - no change in bowels -  hypothyroid, hx graves dz - reports compliance with ongoing medical treatment and no changes in medication dose or frequency. denies adverse side effects related to current therapy.     osteoporosis - on bisphos tx >28yr, stopped fosamax 2012 - no hx fx   remote breast ca 1991, B mastect related to same - annual mammo w/o abn   dyslipidemia- reports compliance with ongoing medical treatment and no changes in medication dose or frequency. denies adverse side effects related to current therapy. no myalgia or weakness  chronic back pain symptoms - working with neurosurgery Dr. Venetia Maxon on same. Has been recommended surgical intervention the patient declines surgery. Working with physical therapist but would like second opinion    Past Medical History  Diagnosis Date  . Hypertension   . COPD (chronic obstructive pulmonary disease)   . Osteopenia   . Aortic stenosis, moderate   . History of DVT (deep vein thrombosis)   . Coronary artery disease     s/p CABGx5 2007  . ALLERGIC RHINITIS   . ASTHMA   . BREAST  CANCER     left, 1991; B mastectomy  . CORONARY ARTERY DISEASE   . DIVERTICULOSIS   . DVT   . EROSIVE GASTRITIS   . GERD   . HYPERLIPIDEMIA   . HYPOTHYROIDISM   . Occlusion and stenosis of carotid artery without mention of cerebral infarction   . OSTEOPOROSIS   . RHINOSINUSITIS, RECURRENT     Review of Systems  Constitutional: Positive for fatigue. Negative for fever and chills.  HENT: Positive for congestion, rhinorrhea and postnasal drip. Negative for nosebleeds, sneezing and sinus pressure.        Objective:   Physical Exam  BP 140/72  Pulse 56  Temp(Src) 97.3 F (36.3 C) (Oral)  Ht 5\' 5"  (1.651 m)  Wt 142 lb 1.9 oz (64.465 kg)  BMI 23.65 kg/m2  SpO2 97% Wt Readings from Last 3 Encounters:  03/29/12 142 lb 1.9 oz (64.465 kg)  12/23/11 141 lb (63.957 kg)  11/11/11 144 lb (65.318 kg)   Constitutional: She appears well-developed and well-nourished. No distress.  HENT: Head: Normocephalic and atraumatic. Ears: B TMs ok, no erythema or effusion; Nose: Nose with clear rhinorrhea, pertinent swelling and congestion but no ulceration or purulence discharge. Sinuses nontender. Mouth/Throat: Oropharynx is clear and moist. No oropharyngeal exudate.  Eyes: Conjunctivae and EOM are normal. Pupils are equal, round, and reactive to light. No scleral icterus.  Neck: Normal range of motion. Neck supple. No JVD present. No thyromegaly present.  Cardiovascular:  Normal rate, regular rhythm and normal heart sounds.  No murmur heard. No BLE edema. Pulmonary/Chest: Effort normal and breath sounds normal. No respiratory distress. She has no wheezes.  Mskel - R foot - no swelling or tenderness to palpation, mild bruising on lateral side, ligamentous fx intact. L hip with pain over palpation over groin - FROM with good internal/external rotation Skin - benign SK changes - follows with derm for same  Lab Results  Component Value Date   WBC 5.7 12/29/2010   HGB 12.3 12/29/2010   HCT 36.6  12/29/2010   PLT 236.0 12/29/2010   GLUCOSE 92 10/26/2010   CHOL 138 12/23/2011   TRIG 43.0 12/23/2011   HDL 53.50 12/23/2011   LDLCALC 76 12/23/2011   ALT 22 12/23/2011   AST 28 12/23/2011   NA 141 10/26/2010   K 3.4* 10/26/2010   CL 106 10/26/2010   CREATININE 0.7 10/26/2010   BUN 12 10/26/2010   CO2 28 10/26/2010   TSH 1.55 11/11/2010   INR 1.0 ratio 10/26/2010       Assessment & Plan:  See problem list. Medications and labs reviewed today.  L groin pain - accidental fall 10 d ago - check L hip and pelvic films to look for stress fx, esp given hx osteoporosis  Chronic low back pain with radiculopathy - ongoing alternate physical therapist. Continue to follow with neurosurgery as recommended and consider ESI or microdiscectomy if indicated/unresponsive to conservative therapy

## 2012-04-03 ENCOUNTER — Ambulatory Visit (INDEPENDENT_AMBULATORY_CARE_PROVIDER_SITE_OTHER): Payer: Medicare Other

## 2012-04-03 DIAGNOSIS — J309 Allergic rhinitis, unspecified: Secondary | ICD-10-CM | POA: Diagnosis not present

## 2012-04-11 ENCOUNTER — Encounter: Payer: Self-pay | Admitting: Internal Medicine

## 2012-04-11 ENCOUNTER — Ambulatory Visit (INDEPENDENT_AMBULATORY_CARE_PROVIDER_SITE_OTHER): Payer: Medicare Other | Admitting: Internal Medicine

## 2012-04-11 VITALS — BP 112/62 | HR 57 | Ht 65.5 in | Wt 142.4 lb

## 2012-04-11 DIAGNOSIS — J45909 Unspecified asthma, uncomplicated: Secondary | ICD-10-CM

## 2012-04-11 DIAGNOSIS — J309 Allergic rhinitis, unspecified: Secondary | ICD-10-CM

## 2012-04-11 DIAGNOSIS — J3089 Other allergic rhinitis: Secondary | ICD-10-CM

## 2012-04-11 DIAGNOSIS — J45998 Other asthma: Secondary | ICD-10-CM

## 2012-04-11 NOTE — Patient Instructions (Addendum)
We can continue present treatment. Please call as needed  It would be okay to get allergy shots in your hips at your discretion

## 2012-04-11 NOTE — Progress Notes (Signed)
04/11/12-71 yoF never smoker followed for chronic obstructive asthma/, allergic rhinitis, history of sinusitis, complicated by CAD/aortic stenosis, history breast cancer, hypothyroid, GERD LOV- 01/20/11 Hx allergy skin testing positive-grass, tree, dust, cat. Has been on allergy vaccine without problem. 2 weeks ago she had been given a Xolair injection by mistake instead of her allergy vaccine. She was watched and counseled appropriately and had no reaction. She has been noticing some tachycardia briefly when she first lies down. It does not wake her and is not associated with shortness of breath. She usually has no routine cough or wheeze. This morning briefly she coughed up some yellow mucus.  ROS-see HPI Constitutional:   No-   weight loss, night sweats, fevers, chills, fatigue, lassitude. HEENT:   No-  headaches, difficulty swallowing, tooth/dental problems, sore throat,       No-  sneezing, itching, ear ache, nasal congestion, post nasal drip,  CV:  No-   chest pain, orthopnea, PND, swelling in lower extremities, anasarca, dizziness,+palpitations Resp: No- acute  shortness of breath with exertion or at rest.              +  productive cough,  No non-productive cough,  No- coughing up of blood.              +   change in color of mucus.  No- wheezing.   Skin: No-   rash or lesions. GI:  No-   heartburn, indigestion, abdominal pain, nausea, vomiting,  GU: . MS:  No-   joint pain or swelling.   Neuro-     nothing unusual Psych:  No- change in mood or affect. No depression or anxiety.  No memory loss.  OBJ- Physical Exam General- Alert, Oriented, Affect-appropriate, Distress- none acute Skin- rash-none, lesions- none, excoriation- none Lymphadenopathy- none Head- atraumatic            Eyes- Gross vision intact, PERRLA, conjunctivae and secretions clear            Ears- Hearing impaired, canals-normal            Nose- Clear, no-Septal dev, mucus, polyps, erosion, perforation   Throat- Mallampati II , mucosa clear , drainage- none, tonsils- atrophic Neck- flexible , trachea midline, no stridor , thyroid nl, carotid no bruit Chest - symmetrical excursion , unlabored           Heart/CV- RRR , 2/6 systolic AS murmur , no gallop  , no rub, nl s1 s2                           - JVD- none , edema- none, stasis changes- none, varices- none           Lung- clear to P&A/ diminished, wheeze- none, cough- none , dullness-none, rub- none           Chest wall- history bilateral mastectomy with reconstruction Abd-  Br/ Gen/ Rectal- Not done, not indicated Extrem- cyanosis- none, clubbing, none, atrophy- none, strength- nl. Good capillary filling at nailbeds. Neuro- grossly intact to observation

## 2012-04-15 NOTE — Assessment & Plan Note (Signed)
She has continued to feel that allergy vaccine helps her. We will supplement with medicines as needed

## 2012-04-15 NOTE — Assessment & Plan Note (Signed)
She is not often needing rescue inhaler as long as she continues Xolair.

## 2012-04-21 ENCOUNTER — Ambulatory Visit (INDEPENDENT_AMBULATORY_CARE_PROVIDER_SITE_OTHER): Payer: Medicare Other

## 2012-04-21 DIAGNOSIS — J309 Allergic rhinitis, unspecified: Secondary | ICD-10-CM

## 2012-04-24 ENCOUNTER — Ambulatory Visit (INDEPENDENT_AMBULATORY_CARE_PROVIDER_SITE_OTHER): Payer: Medicare Other

## 2012-04-24 DIAGNOSIS — J309 Allergic rhinitis, unspecified: Secondary | ICD-10-CM | POA: Diagnosis not present

## 2012-04-25 ENCOUNTER — Encounter: Payer: Self-pay | Admitting: Internal Medicine

## 2012-04-25 ENCOUNTER — Ambulatory Visit (INDEPENDENT_AMBULATORY_CARE_PROVIDER_SITE_OTHER): Payer: Medicare Other

## 2012-04-25 DIAGNOSIS — J309 Allergic rhinitis, unspecified: Secondary | ICD-10-CM | POA: Diagnosis not present

## 2012-05-01 ENCOUNTER — Other Ambulatory Visit: Payer: Self-pay | Admitting: Cardiovascular Disease

## 2012-05-01 ENCOUNTER — Other Ambulatory Visit: Payer: Self-pay | Admitting: *Deleted

## 2012-05-01 MED ORDER — METOPROLOL TARTRATE 25 MG PO TABS: 25.0000 mg | ORAL_TABLET | Freq: Two times a day (BID) | ORAL | Status: DC

## 2012-05-01 MED ORDER — LEVOTHYROXINE SODIUM 75 MCG PO TABS: 75.0000 ug | ORAL_TABLET | Freq: Every day | ORAL | Status: DC

## 2012-05-01 NOTE — Telephone Encounter (Signed)
Pt needs refill sent in 

## 2012-05-01 NOTE — Telephone Encounter (Signed)
Pt requesting refill of Levoxyl to go to [facility] Rx sent, pt informed.

## 2012-05-02 ENCOUNTER — Ambulatory Visit (INDEPENDENT_AMBULATORY_CARE_PROVIDER_SITE_OTHER): Payer: Medicare Other

## 2012-05-02 DIAGNOSIS — J309 Allergic rhinitis, unspecified: Secondary | ICD-10-CM | POA: Diagnosis not present

## 2012-05-08 ENCOUNTER — Ambulatory Visit (INDEPENDENT_AMBULATORY_CARE_PROVIDER_SITE_OTHER): Payer: Medicare Other

## 2012-05-08 DIAGNOSIS — J309 Allergic rhinitis, unspecified: Secondary | ICD-10-CM | POA: Diagnosis not present

## 2012-05-15 ENCOUNTER — Ambulatory Visit (INDEPENDENT_AMBULATORY_CARE_PROVIDER_SITE_OTHER): Payer: Medicare Other

## 2012-05-15 DIAGNOSIS — J309 Allergic rhinitis, unspecified: Secondary | ICD-10-CM

## 2012-05-16 DIAGNOSIS — H02209 Unspecified lagophthalmos unspecified eye, unspecified eyelid: Secondary | ICD-10-CM | POA: Diagnosis not present

## 2012-05-16 DIAGNOSIS — E0500 Thyrotoxicosis with diffuse goiter without thyrotoxic crisis or storm: Secondary | ICD-10-CM | POA: Diagnosis not present

## 2012-05-16 DIAGNOSIS — H04129 Dry eye syndrome of unspecified lacrimal gland: Secondary | ICD-10-CM | POA: Diagnosis not present

## 2012-05-16 DIAGNOSIS — H5200 Hypermetropia, unspecified eye: Secondary | ICD-10-CM | POA: Diagnosis not present

## 2012-05-22 ENCOUNTER — Ambulatory Visit (INDEPENDENT_AMBULATORY_CARE_PROVIDER_SITE_OTHER): Payer: Medicare Other

## 2012-05-22 DIAGNOSIS — J309 Allergic rhinitis, unspecified: Secondary | ICD-10-CM | POA: Diagnosis not present

## 2012-06-01 ENCOUNTER — Ambulatory Visit (INDEPENDENT_AMBULATORY_CARE_PROVIDER_SITE_OTHER): Payer: Medicare Other

## 2012-06-01 DIAGNOSIS — J309 Allergic rhinitis, unspecified: Secondary | ICD-10-CM

## 2012-06-14 ENCOUNTER — Other Ambulatory Visit: Payer: Self-pay | Admitting: Internal Medicine

## 2012-06-15 ENCOUNTER — Ambulatory Visit (INDEPENDENT_AMBULATORY_CARE_PROVIDER_SITE_OTHER): Payer: Medicare Other

## 2012-06-15 DIAGNOSIS — J309 Allergic rhinitis, unspecified: Secondary | ICD-10-CM

## 2012-06-16 DIAGNOSIS — H5340 Unspecified visual field defects: Secondary | ICD-10-CM | POA: Diagnosis not present

## 2012-06-16 DIAGNOSIS — E0500 Thyrotoxicosis with diffuse goiter without thyrotoxic crisis or storm: Secondary | ICD-10-CM | POA: Diagnosis not present

## 2012-06-23 ENCOUNTER — Ambulatory Visit (INDEPENDENT_AMBULATORY_CARE_PROVIDER_SITE_OTHER): Payer: Medicare Other

## 2012-06-23 DIAGNOSIS — J309 Allergic rhinitis, unspecified: Secondary | ICD-10-CM | POA: Diagnosis not present

## 2012-06-29 ENCOUNTER — Ambulatory Visit (INDEPENDENT_AMBULATORY_CARE_PROVIDER_SITE_OTHER): Payer: Medicare Other

## 2012-06-29 DIAGNOSIS — J309 Allergic rhinitis, unspecified: Secondary | ICD-10-CM | POA: Diagnosis not present

## 2012-07-03 ENCOUNTER — Ambulatory Visit (INDEPENDENT_AMBULATORY_CARE_PROVIDER_SITE_OTHER): Payer: Medicare Other

## 2012-07-03 DIAGNOSIS — J309 Allergic rhinitis, unspecified: Secondary | ICD-10-CM | POA: Diagnosis not present

## 2012-07-10 ENCOUNTER — Ambulatory Visit (INDEPENDENT_AMBULATORY_CARE_PROVIDER_SITE_OTHER): Payer: Medicare Other

## 2012-07-10 DIAGNOSIS — J309 Allergic rhinitis, unspecified: Secondary | ICD-10-CM | POA: Diagnosis not present

## 2012-07-18 ENCOUNTER — Ambulatory Visit (INDEPENDENT_AMBULATORY_CARE_PROVIDER_SITE_OTHER): Payer: Medicare Other

## 2012-07-18 ENCOUNTER — Other Ambulatory Visit: Payer: Self-pay | Admitting: Internal Medicine

## 2012-07-18 DIAGNOSIS — J309 Allergic rhinitis, unspecified: Secondary | ICD-10-CM

## 2012-07-18 DIAGNOSIS — L723 Sebaceous cyst: Secondary | ICD-10-CM | POA: Diagnosis not present

## 2012-07-19 ENCOUNTER — Telehealth: Payer: Self-pay | Admitting: Internal Medicine

## 2012-07-19 NOTE — Telephone Encounter (Signed)
Caller: [name]/Patient; Patient Name: [name]; PCP: Rene Paci (Adults only); Best Callback Phone Number: [phone].  Wants to have her Ambien refilled sent to [facility].  She has called the pharmacy and they advised her to call the office.  Patient taked Zolpidem Tartrate 10 mg tabs, take 5 mg by mouth at bedtime as needed.  Also needs refill for herLevoxyl.  [facility] did not send her Levoxyl, they sent her L-thryroxine tab 75 mcg, disp 90.  Just wanted to let the MD know they had substituted the Levoxyl, [facility] said for further refills have MD write that medication can be substituted if need by medication that is Manufactured by [employer].  Patient requesting refill for the Ambien and the Levoxyl sent to [facility].  Last office visit was 03/29/12.  If you have any problems or concerns contact patient at above call back number.

## 2012-07-19 NOTE — Telephone Encounter (Signed)
Ok to correct/fill meds as requested

## 2012-07-20 MED ORDER — ZOLPIDEM TARTRATE 10 MG PO TABS: 5.0000 mg | ORAL_TABLET | Freq: Every evening | ORAL | Status: DC | PRN

## 2012-07-20 MED ORDER — LEVOTHYROXINE SODIUM 75 MCG PO TABS: 75.0000 ug | ORAL_TABLET | Freq: Every day | ORAL | Status: DC

## 2012-07-20 NOTE — Telephone Encounter (Signed)
Rx printed and fax to [facility].../[facility]

## 2012-07-24 ENCOUNTER — Ambulatory Visit (INDEPENDENT_AMBULATORY_CARE_PROVIDER_SITE_OTHER): Payer: Medicare Other

## 2012-07-24 ENCOUNTER — Ambulatory Visit (INDEPENDENT_AMBULATORY_CARE_PROVIDER_SITE_OTHER): Payer: Medicare Other | Admitting: Cardiovascular Disease

## 2012-07-24 ENCOUNTER — Encounter: Payer: Self-pay | Admitting: Cardiovascular Disease

## 2012-07-24 VITALS — BP 114/90 | HR 61 | Ht 65.5 in | Wt 140.1 lb

## 2012-07-24 DIAGNOSIS — I2510 Atherosclerotic heart disease of native coronary artery without angina pectoris: Secondary | ICD-10-CM

## 2012-07-24 DIAGNOSIS — J309 Allergic rhinitis, unspecified: Secondary | ICD-10-CM | POA: Diagnosis not present

## 2012-07-24 DIAGNOSIS — E785 Hyperlipidemia, unspecified: Secondary | ICD-10-CM | POA: Diagnosis not present

## 2012-07-24 LAB — BASIC METABOLIC PANEL
CO2: 25 mEq/L (ref 19–32)
Calcium: 9.2 mg/dL (ref 8.4–10.5)
Chloride: 104 mEq/L (ref 96–112)
Glucose, Bld: 90 mg/dL (ref 70–99)
Sodium: 139 mEq/L (ref 135–145)

## 2012-07-24 NOTE — Assessment & Plan Note (Signed)
Last lipids reviewed and there excellent. LFTs were normal. Repeat lipid panel and liver tests today. Continue atorvastatin 20 mg.

## 2012-07-24 NOTE — Assessment & Plan Note (Signed)
Stable without anginal symptoms. She is on appropriate medications with aspirin 81 mg, a statin drug, and a beta blocker.

## 2012-07-24 NOTE — Patient Instructions (Addendum)
Your physician recommends that you have lab work today: LIPID, BMP and LIVER  Your physician wants you to follow-up in: 6 MONTHS with Dr [name].  You will receive a reminder letter in the mail two months in advance. If you don't receive a letter, please call our office to schedule the follow-up appointment.  Please monitor your BP.  Contact the office if your BP is above 140/90.

## 2012-07-24 NOTE — Progress Notes (Signed)
HPI:  71 year old woman presenting for followup evaluation. The patient has CAD status post CABG in 2007 after presenting with non-ST elevation infarction. She had followup cardiac catheterization in 2011 when she presented with chest pain. She had continued patency of all of her bypass grafts in her left ventricular function has been preserved. She is also followed for mild carotid stenosis and hyperlipidemia. Last lipid panel from February 2013 showed a cholesterol of 138, HDL 54, and LDL 76. Her triglycerides were 43.  Overall the patient is doing well. She still has palpitations when she lies on her left side. She has not had chest pain or pressure, dyspnea, or edema. She has some leg cramps at nighttime and drinks tonic water before bed. She does not have calf claudication symptoms. She walks 2 miles daily at her church. She feels tired after her walk but has no symptoms with exertion the  Outpatient Encounter Prescriptions as of 07/24/2012  Medication Sig Dispense Refill  . aspirin 81 MG tablet Take 81 mg by mouth daily.        Marland Kitchen atorvastatin (LIPITOR) 20 MG tablet Take 1 tablet (20 mg total) by mouth daily.  90 tablet  3  . Calcium Carbonate (CALTRATE 600 PO) Take 2 tablets by mouth daily.        . Coenzyme Q10 (CO Q-10 PO) Take 50 mg by mouth daily.        . fluticasone (FLONASE) 50 MCG/ACT nasal spray USE 2 SPRAYS IN EACH NOSTRIL EVERY DAY  16 g  3  . levothyroxine (LEVOXYL) 75 MCG tablet Take 1 tablet (75 mcg total) by mouth daily.  90 tablet  1  . loratadine (CLARITIN) 10 MG tablet Take 10 mg by mouth daily.       . meloxicam (MOBIC) 15 MG tablet Take 15 mg by mouth daily. TAke 1/4 tablet or as directed      . metoprolol tartrate (LOPRESSOR) 25 MG tablet Take 1 tablet (25 mg total) by mouth 2 (two) times daily.  180 tablet  2  . multivitamin (THERAGRAN) per tablet Take 1 tablet by mouth daily.        . pantoprazole (PROTONIX) 40 MG tablet Take 40 mg by mouth daily.        Marland Kitchen  senna-docusate (SENOKOT-S) 8.6-50 MG per tablet Take 1 tablet by mouth daily.        Marland Kitchen zolpidem (AMBIEN) 10 MG tablet Take 0.5 tablets (5 mg total) by mouth at bedtime as needed.  45 tablet  0  . DISCONTD: fluticasone (FLONASE) 50 MCG/ACT nasal spray Place 2 sprays into the nose daily as needed.      Marland Kitchen DISCONTD: pantoprazole (PROTONIX) 40 MG tablet TAKE 1 TABLET DAILY  90 tablet  0    Allergies  Allergen Reactions  . Clarithromycin   . Codeine     REACTION: nausea  . Levofloxacin     REACTION: nausea  . Morphine   . Naproxen     REACTION: nausea  . Penicillins     REACTION: RASH    Past Medical History  Diagnosis Date  . Hypertension   . COPD (chronic obstructive pulmonary disease)   . Osteopenia   . Aortic stenosis, moderate   . History of DVT (deep vein thrombosis)   . Coronary artery disease     s/p CABGx5 2007  . ALLERGIC RHINITIS   . ASTHMA   . BREAST CANCER     left, 1991; B mastectomy  . CORONARY  ARTERY DISEASE   . DIVERTICULOSIS   . DVT   . EROSIVE GASTRITIS   . GERD   . HYPERLIPIDEMIA   . HYPOTHYROIDISM   . Occlusion and stenosis of carotid artery without mention of cerebral infarction   . OSTEOPOROSIS   . RHINOSINUSITIS, RECURRENT     ROS: Negative except as per HPI  BP 114/90  Pulse 61  Ht 5' 5.5" (1.664 m)  Wt 63.558 kg (140 lb 1.9 oz)  BMI 22.96 kg/m2  PHYSICAL EXAM: Pt is alert and oriented, NAD HEENT: normal Neck: JVP - normal, carotids 2+= with soft bilateral bruits Lungs: CTA bilaterally CV: RRR with grade 2/6 systolic ejection murmur at the left sternal border Abd: soft, NT, Positive BS, no hepatomegaly Ext: no C/C/E, distal pulses intact and equal Skin: warm/dry no rash  EKG:  Normal sinus rhythm 61 beats per minute, within normal limits.  ASSESSMENT AND PLAN:

## 2012-07-25 ENCOUNTER — Other Ambulatory Visit: Payer: Self-pay | Admitting: *Deleted

## 2012-07-25 ENCOUNTER — Ambulatory Visit (INDEPENDENT_AMBULATORY_CARE_PROVIDER_SITE_OTHER): Payer: Medicare Other | Admitting: *Deleted

## 2012-07-25 DIAGNOSIS — I2510 Atherosclerotic heart disease of native coronary artery without angina pectoris: Secondary | ICD-10-CM

## 2012-07-25 DIAGNOSIS — E785 Hyperlipidemia, unspecified: Secondary | ICD-10-CM | POA: Diagnosis not present

## 2012-07-25 LAB — LIPID PANEL
Cholesterol: 142 mg/dL (ref 0–200)
HDL: 56.5 mg/dL (ref >39.00)
Triglycerides: 69 mg/dL (ref 0.0–149.0)
VLDL: 13.8 mg/dL (ref 0.0–40.0)

## 2012-07-25 LAB — HEPATIC FUNCTION PANEL
ALT: 29 U/L (ref 0–35)
AST: 41 U/L — ABNORMAL HIGH (ref 0–37)
Albumin: 4.3 g/dL (ref 3.5–5.2)
Total Protein: 7.2 g/dL (ref 6.0–8.3)

## 2012-07-26 ENCOUNTER — Encounter: Payer: Self-pay | Admitting: Cardiovascular Disease

## 2012-07-26 NOTE — Telephone Encounter (Signed)
This encounter was created in error - please disregard.

## 2012-07-26 NOTE — Telephone Encounter (Signed)
Pt has another question, [phone], concerned about liver count, is it ok for her to give blood today, will be there until 200p

## 2012-08-01 ENCOUNTER — Encounter: Payer: Self-pay | Admitting: Internal Medicine

## 2012-08-02 ENCOUNTER — Ambulatory Visit (INDEPENDENT_AMBULATORY_CARE_PROVIDER_SITE_OTHER): Payer: Medicare Other

## 2012-08-02 DIAGNOSIS — J309 Allergic rhinitis, unspecified: Secondary | ICD-10-CM | POA: Diagnosis not present

## 2012-08-08 ENCOUNTER — Ambulatory Visit (INDEPENDENT_AMBULATORY_CARE_PROVIDER_SITE_OTHER): Payer: Medicare Other

## 2012-08-08 DIAGNOSIS — J309 Allergic rhinitis, unspecified: Secondary | ICD-10-CM

## 2012-08-15 ENCOUNTER — Ambulatory Visit (INDEPENDENT_AMBULATORY_CARE_PROVIDER_SITE_OTHER): Payer: Medicare Other

## 2012-08-15 DIAGNOSIS — J309 Allergic rhinitis, unspecified: Secondary | ICD-10-CM

## 2012-08-23 DIAGNOSIS — L909 Atrophic disorder of skin, unspecified: Secondary | ICD-10-CM | POA: Diagnosis not present

## 2012-08-23 DIAGNOSIS — L919 Hypertrophic disorder of the skin, unspecified: Secondary | ICD-10-CM | POA: Diagnosis not present

## 2012-08-23 DIAGNOSIS — D2370 Other benign neoplasm of skin of unspecified lower limb, including hip: Secondary | ICD-10-CM | POA: Diagnosis not present

## 2012-08-23 DIAGNOSIS — L821 Other seborrheic keratosis: Secondary | ICD-10-CM | POA: Diagnosis not present

## 2012-08-25 ENCOUNTER — Ambulatory Visit (INDEPENDENT_AMBULATORY_CARE_PROVIDER_SITE_OTHER): Payer: Medicare Other

## 2012-08-25 DIAGNOSIS — J309 Allergic rhinitis, unspecified: Secondary | ICD-10-CM

## 2012-08-25 DIAGNOSIS — Z23 Encounter for immunization: Secondary | ICD-10-CM

## 2012-08-28 DIAGNOSIS — Z23 Encounter for immunization: Secondary | ICD-10-CM | POA: Diagnosis not present

## 2012-08-29 ENCOUNTER — Ambulatory Visit (INDEPENDENT_AMBULATORY_CARE_PROVIDER_SITE_OTHER): Payer: Medicare Other

## 2012-08-29 DIAGNOSIS — J309 Allergic rhinitis, unspecified: Secondary | ICD-10-CM | POA: Diagnosis not present

## 2012-08-30 ENCOUNTER — Ambulatory Visit (INDEPENDENT_AMBULATORY_CARE_PROVIDER_SITE_OTHER): Payer: Medicare Other

## 2012-08-30 DIAGNOSIS — J309 Allergic rhinitis, unspecified: Secondary | ICD-10-CM

## 2012-09-04 ENCOUNTER — Ambulatory Visit (INDEPENDENT_AMBULATORY_CARE_PROVIDER_SITE_OTHER): Payer: Medicare Other

## 2012-09-04 DIAGNOSIS — J309 Allergic rhinitis, unspecified: Secondary | ICD-10-CM | POA: Diagnosis not present

## 2012-09-12 ENCOUNTER — Ambulatory Visit (INDEPENDENT_AMBULATORY_CARE_PROVIDER_SITE_OTHER): Payer: Medicare Other

## 2012-09-12 ENCOUNTER — Other Ambulatory Visit: Payer: Self-pay | Admitting: Cardiovascular Disease

## 2012-09-12 DIAGNOSIS — J309 Allergic rhinitis, unspecified: Secondary | ICD-10-CM

## 2012-09-18 ENCOUNTER — Ambulatory Visit (INDEPENDENT_AMBULATORY_CARE_PROVIDER_SITE_OTHER): Payer: Medicare Other

## 2012-09-18 DIAGNOSIS — J309 Allergic rhinitis, unspecified: Secondary | ICD-10-CM | POA: Diagnosis not present

## 2012-09-25 ENCOUNTER — Ambulatory Visit (INDEPENDENT_AMBULATORY_CARE_PROVIDER_SITE_OTHER): Payer: Medicare Other

## 2012-09-25 DIAGNOSIS — J309 Allergic rhinitis, unspecified: Secondary | ICD-10-CM

## 2012-10-02 ENCOUNTER — Ambulatory Visit (INDEPENDENT_AMBULATORY_CARE_PROVIDER_SITE_OTHER): Payer: Medicare Other

## 2012-10-02 DIAGNOSIS — J309 Allergic rhinitis, unspecified: Secondary | ICD-10-CM | POA: Diagnosis not present

## 2012-10-09 ENCOUNTER — Ambulatory Visit (INDEPENDENT_AMBULATORY_CARE_PROVIDER_SITE_OTHER): Payer: Medicare Other

## 2012-10-09 DIAGNOSIS — J309 Allergic rhinitis, unspecified: Secondary | ICD-10-CM | POA: Diagnosis not present

## 2012-10-17 ENCOUNTER — Ambulatory Visit (INDEPENDENT_AMBULATORY_CARE_PROVIDER_SITE_OTHER): Payer: Medicare Other

## 2012-10-17 DIAGNOSIS — J309 Allergic rhinitis, unspecified: Secondary | ICD-10-CM

## 2012-10-23 ENCOUNTER — Other Ambulatory Visit: Payer: Self-pay | Admitting: Internal Medicine

## 2012-10-23 ENCOUNTER — Other Ambulatory Visit: Payer: Self-pay | Admitting: *Deleted

## 2012-10-23 MED ORDER — ZOLPIDEM TARTRATE 10 MG PO TABS: 5.0000 mg | ORAL_TABLET | Freq: Every evening | ORAL | Status: DC | PRN

## 2012-10-23 NOTE — Telephone Encounter (Signed)
Faxed script back to [facility]...[facility]

## 2012-10-26 ENCOUNTER — Ambulatory Visit (INDEPENDENT_AMBULATORY_CARE_PROVIDER_SITE_OTHER): Payer: Medicare Other

## 2012-10-26 DIAGNOSIS — J309 Allergic rhinitis, unspecified: Secondary | ICD-10-CM | POA: Diagnosis not present

## 2012-10-31 ENCOUNTER — Ambulatory Visit (INDEPENDENT_AMBULATORY_CARE_PROVIDER_SITE_OTHER): Payer: Medicare Other

## 2012-10-31 DIAGNOSIS — J309 Allergic rhinitis, unspecified: Secondary | ICD-10-CM

## 2012-11-06 ENCOUNTER — Ambulatory Visit (INDEPENDENT_AMBULATORY_CARE_PROVIDER_SITE_OTHER): Payer: Medicare Other

## 2012-11-06 DIAGNOSIS — J309 Allergic rhinitis, unspecified: Secondary | ICD-10-CM

## 2012-11-13 ENCOUNTER — Ambulatory Visit (INDEPENDENT_AMBULATORY_CARE_PROVIDER_SITE_OTHER): Payer: Medicare Other

## 2012-11-13 DIAGNOSIS — J309 Allergic rhinitis, unspecified: Secondary | ICD-10-CM | POA: Diagnosis not present

## 2012-11-14 ENCOUNTER — Encounter: Payer: Self-pay | Admitting: Internal Medicine

## 2012-11-23 ENCOUNTER — Ambulatory Visit (INDEPENDENT_AMBULATORY_CARE_PROVIDER_SITE_OTHER): Payer: Medicare Other

## 2012-11-23 DIAGNOSIS — J309 Allergic rhinitis, unspecified: Secondary | ICD-10-CM | POA: Diagnosis not present

## 2012-11-28 ENCOUNTER — Ambulatory Visit (INDEPENDENT_AMBULATORY_CARE_PROVIDER_SITE_OTHER): Payer: Medicare Other

## 2012-11-28 DIAGNOSIS — J309 Allergic rhinitis, unspecified: Secondary | ICD-10-CM

## 2012-11-30 ENCOUNTER — Encounter: Payer: Self-pay | Admitting: Internal Medicine

## 2012-11-30 ENCOUNTER — Ambulatory Visit (INDEPENDENT_AMBULATORY_CARE_PROVIDER_SITE_OTHER): Payer: Medicare Other | Admitting: Internal Medicine

## 2012-11-30 VITALS — BP 142/62 | HR 65 | Temp 97.0°F | Ht 65.0 in | Wt 140.0 lb

## 2012-11-30 DIAGNOSIS — J329 Chronic sinusitis, unspecified: Secondary | ICD-10-CM | POA: Diagnosis not present

## 2012-11-30 DIAGNOSIS — F411 Generalized anxiety disorder: Secondary | ICD-10-CM | POA: Diagnosis not present

## 2012-11-30 DIAGNOSIS — E039 Hypothyroidism, unspecified: Secondary | ICD-10-CM

## 2012-11-30 DIAGNOSIS — M76899 Other specified enthesopathies of unspecified lower limb, excluding foot: Secondary | ICD-10-CM

## 2012-11-30 DIAGNOSIS — M7062 Trochanteric bursitis, left hip: Secondary | ICD-10-CM

## 2012-11-30 DIAGNOSIS — F418 Other specified anxiety disorders: Secondary | ICD-10-CM

## 2012-11-30 MED ORDER — PREDNISONE (PAK) 10 MG PO TABS: 10.0000 mg | ORAL_TABLET | ORAL | Status: DC

## 2012-11-30 NOTE — Assessment & Plan Note (Signed)
Previously followed with pulm (young) for same stopped Astelin due to ineffective symptoms relief - on Flonase since 10/2011 tx acute symptoms with pred taper x 6 days for acute inflammation -given improvement 10/2011 with same No infx so hold antibiotics

## 2012-11-30 NOTE — Patient Instructions (Addendum)
It was good to see you today. Prednisone taper x 6 days for sinus symptoms as discussed - Your prescription(s) have been submitted to your pharmacy. Please take as directed and contact our office if you believe you are having problem(s) with the medication(s). We injected your left trochanteric bursa today - Ice to this area for 5 minutes per episode as discussed Medications reviewed, no changes at this time. Let us know if you would like referral for counseling  Please schedule followup in 6 months, call sooner if problems. Trochanteric Bursitis You have hip pain due to trochanteric bursitis. Bursitis means that the sack near the outside of the hip is filled with fluid and inflamed. This sack is made up of protective soft tissue. The pain from trochanteric bursitis can be severe and keep you from sleep. It can radiate to the buttocks or down the outside of the thigh to the knee. The pain is almost always worse when rising from the seated or lying position and with walking. Pain can improve after you take a few steps. It happens more often in people with hip joint and lumbar spine problems, such as arthritis or previous surgery. Very rarely the trochanteric bursa can become infected, and antibiotics and/or surgery may be needed. Treatment often includes an injection of local anesthetic mixed with cortisone medicine. This medicine is injected into the area where it is most tender over the hip. Repeat injections may be necessary if the response to treatment is slow. You can apply ice packs over the tender area for 30 minutes every 2 hours for the next few days. Anti-inflammatory and/or narcotic pain medicine may also be helpful. Limit your activity for the next few days if the pain continues. See your caregiver in 5-10 days if you are not greatly improved.   SEEK IMMEDIATE MEDICAL CARE IF:  You develop severe pain, fever, or increased redness.   You have pain that radiates below the knee.   EXERCISES STRETCHING EXERCISES - Trochantic Bursitis  These exercises may help you when beginning to rehabilitate your injury. Your symptoms may resolve with or without further involvement from your physician, physical therapist or athletic trainer. While completing these exercises, remember:    Restoring tissue flexibility helps normal motion to return to the joints. This allows healthier, less painful movement and activity.   An effective stretch should be held for at least 30 seconds.   A stretch should never be painful. You should only feel a gentle lengthening or release in the stretched tissue.  STRETCH  Iliotibial Band  On the floor or bed, lie on your side so your injured leg is on top. Bend your knee and grab your ankle.   Slowly bring your knee back so that your thigh is in line with your trunk. Keep your heel at your buttocks and gently arch your back so your head, shoulders and hips line up.   Slowly lower your leg so that your knee approaches the floor/bed until you feel a gentle stretch on the outside of your thigh. If you do not feel a stretch and your knee will not fall farther, place the heel of your opposite foot on top of your knee and pull your thigh down farther.   Hold this stretch for __________ seconds.   Repeat __________ times. Complete this exercise __________ times per day.  STRETCH Hamstrings, Supine   Lie on your back. Loop a belt or towel over the ball of your foot as shown.   Straighten  your knee and slowly pull on the belt to raise your injured leg. Do not allow the knee to bend. Keep your opposite leg flat on the floor.   Raise the leg until you feel a gentle stretch behind your knee or thigh. Hold this position for __________ seconds.   Repeat __________ times. Complete this stretch __________ times per day.  STRETCH - Quadriceps, Prone   Lie on your stomach on a firm surface, such as a bed or padded floor.   Bend your knee and grasp your ankle.  If you are unable to reach, your ankle or pant leg, use a belt around your foot to lengthen your reach.   Gently pull your heel toward your buttocks. Your knee should not slide out to the side. You should feel a stretch in the front of your thigh and/or knee.   Hold this position for __________ seconds.   Repeat __________ times. Complete this stretch __________ times per day.  STRETCHING - Hip Flexors, Lunge Half kneel with your knee on the floor and your opposite knee bent and directly over your ankle.  Keep good posture with your head over your shoulders. Tighten your buttocks to point your tailbone downward; this will prevent your back from arching too much.   You should feel a gentle stretch in the front of your thigh and/or hip. If you do not feel any resistance, slightly slide your opposite foot forward and then slowly lunge forward so your knee once again lines up over your ankle. Be sure your tailbone remains pointed downward.   Hold this stretch for __________ seconds.   Repeat __________ times. Complete this stretch __________ times per day.  STRETCH - Adductors, Lunge  While standing, spread your legs   Lean away from your injured leg by bending your opposite knee. You may rest your hands on your thigh for balance.   You should feel a stretch in your inner thigh. Hold for __________ seconds.   Repeat __________ times. Complete this exercise __________ times per day.  Document Released: 12/09/2004 Document Revised: 01/24/2012 Document Reviewed: 02/13/2009 [facility] [place] Patient Information 2013 [place], [place].

## 2012-11-30 NOTE — Progress Notes (Signed)
Subjective:    Patient ID: [name], female    DOB: Oct 06, 1941, 72 y.o.   MRN: [id number]  Anxiety Presents for initial visit. Onset was 1 to 6 months ago. The problem has been waxing and waning. Symptoms include decreased concentration, excessive worry and insomnia. Patient reports no irritability, nervous/anxious behavior, obsessions, restlessness or shortness of breath. Symptoms occur most days. The quality of sleep is non-restorative.   Risk factors: death of BF 11-13-2012 - unexpected. Her past medical history is significant for CAD. There is no history of depression or fibromyalgia.  Sinusitis Associated symptoms include congestion. Pertinent negatives include no chills, shortness of breath, sinus pressure or sneezing.    Also reviewed chronic medical issues today -   CAD - s/p CABG 1997 - reports compliance with ongoing medical treatment and no changes in medication dose or frequency. denies adverse side effects related to current therapy. no angina or dyspnea on exertion, occasional chest pain s/p cath due to same (2010-11-13 - no obstructive disease)   GERD - follows with GI for same - daily symptoms prior to nexium - changed to alt PPI (pantoprotazole) July 2012 because of cost concerns- no abd pain - no change in bowels -  hypothyroid, hx graves dz - reports compliance with ongoing medical treatment and no changes in medication dose or frequency. denies adverse side effects related to current therapy.     osteoporosis - on bisphos tx >33yr, stopped fosamax 2012 - no hx fx   remote breast ca 1991, B mastect related to same - annual mammo without abnormallity   dyslipidemia- reports compliance with ongoing medical treatment and no changes in medication dose or frequency. denies adverse side effects related to current therapy. no myalgia or weakness  chronic back pain symptoms - working with neurosurgery Dr. Venetia Maxon on same. Has been recommended surgical intervention the patient declines  surgery. Working with physical therapist    Past Medical History  Diagnosis Date  . Hypertension   . COPD (chronic obstructive pulmonary disease)   . Osteopenia   . Aortic stenosis, moderate   . History of DVT (deep vein thrombosis)   . Coronary artery disease     s/p CABGx5 2007  . ALLERGIC RHINITIS   . ASTHMA   . BREAST CANCER     left, 1991; B mastectomy  . CORONARY ARTERY DISEASE   . DIVERTICULOSIS   . DVT   . EROSIVE GASTRITIS   . GERD   . HYPERLIPIDEMIA   . HYPOTHYROIDISM   . Occlusion and stenosis of carotid artery without mention of cerebral infarction   . OSTEOPOROSIS   . RHINOSINUSITIS, RECURRENT     Review of Systems  Constitutional: Positive for fatigue. Negative for fever, chills and irritability.  HENT: Positive for congestion, rhinorrhea and postnasal drip. Negative for nosebleeds, sneezing and sinus pressure.   Respiratory: Negative for shortness of breath.   Psychiatric/Behavioral: Positive for decreased concentration. The patient has insomnia. The patient is not nervous/anxious.        Objective:   Physical Exam  BP 142/62  Pulse 65  Temp 97 F (36.1 C) (Oral)  Ht 5\' 5"  (1.651 m)  Wt 140 lb (63.504 kg)  BMI 23.30 kg/m2  SpO2 98% Wt Readings from Last 3 Encounters:  11/30/12 140 lb (63.504 kg)  07/24/12 140 lb 1.9 oz (63.558 kg)  04/11/12 142 lb 6.4 oz (64.592 kg)   Constitutional: She appears well-developed and well-nourished. No distress.  HENT: Head: Normocephalic and atraumatic. Ears:  B TMs ok, no erythema or effusion; Nose: Nose with clear rhinorrhea, pertinent swelling and congestion but no ulceration or purulence discharge. Sinuses nontender. Mouth/Throat: Oropharynx is clear and moist. No oropharyngeal exudate.  Eyes: Conjunctivae and EOM are normal. Pupils are equal, round, and reactive to light. No scleral icterus.  Neck: Normal range of motion. Neck supple. No JVD present. No thyromegaly present.  Cardiovascular: Normal rate, regular  rhythm and normal heart sounds.  No murmur heard. No BLE edema. Pulmonary/Chest: Effort normal and breath sounds normal. No respiratory distress. She has no wheezes.  Mskel - exquisitely tender over greater troch - L hip without pain over palpation over groin - FROM with good internal/external rotation   Lab Results  Component Value Date   WBC 5.7 12/29/2010   HGB 12.3 12/29/2010   HCT 36.6 12/29/2010   PLT 236.0 12/29/2010   GLUCOSE 90 07/24/2012   CHOL 142 07/25/2012   TRIG 69.0 07/25/2012   HDL 56.50 07/25/2012   LDLCALC 72 07/25/2012   ALT 29 07/25/2012   AST 41* 07/25/2012   NA 139 07/24/2012   K 3.9 07/24/2012   CL 104 07/24/2012   CREATININE 0.7 07/24/2012   BUN 17 07/24/2012   CO2 25 07/24/2012   TSH 0.98 03/29/2012   INR 1.0 ratio 10/26/2010     Procedure Note:  Greater trochanteric bursa injection the patient elects to proceed after verbal consent is obtained. the patient informed of possible risks and complications prior to procedure. Using sterile technique throughout, patient is injected with 1:3 DepoMedrol (40 mg): 1% lidocaine into trochanteric bursa at site of maximal tenderness. the patient tolerated the procedure well. Ice 24-48h, heat thereafter as needed instructions aftercare provided.      Assessment & Plan:  See problem list. Medications and labs reviewed today.  Situation anxiety - precipitated by unexpected death of BF Nov 09, 2012 - support offered - denies need for BZs or counseling at this time  L troch bursitis - injection today as above - refer back to PT as needed  Chronic low back pain with radiculopathy - ongoing alternate physical therapist. Continue to follow with neurosurgery as recommended and consider ESI or microdiscectomy if indicated/unresponsive to conservative therapy - refil on meloxicam today

## 2012-11-30 NOTE — Assessment & Plan Note (Signed)
Lab Results  Component Value Date   TSH 0.98 03/29/2012  recheck now The current medical regimen is effective;  continue present plan and medications.

## 2012-12-04 ENCOUNTER — Ambulatory Visit (INDEPENDENT_AMBULATORY_CARE_PROVIDER_SITE_OTHER): Payer: Medicare Other

## 2012-12-04 ENCOUNTER — Other Ambulatory Visit: Payer: Self-pay | Admitting: Internal Medicine

## 2012-12-04 DIAGNOSIS — M549 Dorsalgia, unspecified: Secondary | ICD-10-CM

## 2012-12-04 DIAGNOSIS — M25551 Pain in right hip: Secondary | ICD-10-CM

## 2012-12-04 DIAGNOSIS — M533 Sacrococcygeal disorders, not elsewhere classified: Secondary | ICD-10-CM

## 2012-12-04 DIAGNOSIS — J309 Allergic rhinitis, unspecified: Secondary | ICD-10-CM | POA: Diagnosis not present

## 2012-12-04 MED ORDER — MELOXICAM 15 MG PO TABS: 15.0000 mg | ORAL_TABLET | Freq: Every day | ORAL | Status: DC

## 2012-12-04 NOTE — Telephone Encounter (Signed)
Pt req refill for Mobic 15 mg to be call into [facility] for 90 days supply. Please call pt if this is ok, last ov was 11/30/12.

## 2012-12-04 NOTE — Telephone Encounter (Signed)
REFILL ON MOBIC SENT TO [facility]. PATIENT NOTIFIED.

## 2012-12-08 DIAGNOSIS — Z01419 Encounter for gynecological examination (general) (routine) without abnormal findings: Secondary | ICD-10-CM | POA: Diagnosis not present

## 2012-12-08 DIAGNOSIS — Z124 Encounter for screening for malignant neoplasm of cervix: Secondary | ICD-10-CM | POA: Diagnosis not present

## 2012-12-08 DIAGNOSIS — Z1151 Encounter for screening for human papillomavirus (HPV): Secondary | ICD-10-CM | POA: Diagnosis not present

## 2012-12-12 ENCOUNTER — Ambulatory Visit (INDEPENDENT_AMBULATORY_CARE_PROVIDER_SITE_OTHER): Payer: Medicare Other

## 2012-12-12 DIAGNOSIS — J309 Allergic rhinitis, unspecified: Secondary | ICD-10-CM

## 2012-12-14 ENCOUNTER — Other Ambulatory Visit: Payer: Self-pay | Admitting: Internal Medicine

## 2012-12-14 ENCOUNTER — Telehealth: Payer: Self-pay | Admitting: Internal Medicine

## 2012-12-14 DIAGNOSIS — M7072 Other bursitis of hip, left hip: Secondary | ICD-10-CM

## 2012-12-14 NOTE — Telephone Encounter (Signed)
Patient states that the injection in her hip did not help her pain and she is requesting an order for Physical Therapy

## 2012-12-19 ENCOUNTER — Telehealth: Payer: Self-pay | Admitting: *Deleted

## 2012-12-19 NOTE — Telephone Encounter (Signed)
Left msg on vm stating she received a call from some where pertaining to her physical therapy. Discuss with Dr. Felicity Coyer that she want to go back to [facility]. [place] office is needing order fax for PT....Raechel Chute

## 2012-12-20 ENCOUNTER — Ambulatory Visit (INDEPENDENT_AMBULATORY_CARE_PROVIDER_SITE_OTHER): Payer: Medicare Other

## 2012-12-20 DIAGNOSIS — J309 Allergic rhinitis, unspecified: Secondary | ICD-10-CM | POA: Diagnosis not present

## 2012-12-20 NOTE — Telephone Encounter (Signed)
Notified pt referral was faxed  to [facility] @ [phone]...lmb

## 2012-12-25 ENCOUNTER — Ambulatory Visit (INDEPENDENT_AMBULATORY_CARE_PROVIDER_SITE_OTHER): Payer: Medicare Other

## 2012-12-25 DIAGNOSIS — J309 Allergic rhinitis, unspecified: Secondary | ICD-10-CM | POA: Diagnosis not present

## 2012-12-26 ENCOUNTER — Other Ambulatory Visit: Payer: Self-pay | Admitting: *Deleted

## 2012-12-26 MED ORDER — METOPROLOL TARTRATE 25 MG PO TABS: 25.0000 mg | ORAL_TABLET | Freq: Two times a day (BID) | ORAL | Status: DC

## 2013-01-02 ENCOUNTER — Ambulatory Visit (INDEPENDENT_AMBULATORY_CARE_PROVIDER_SITE_OTHER): Payer: Medicare Other

## 2013-01-02 DIAGNOSIS — M76899 Other specified enthesopathies of unspecified lower limb, excluding foot: Secondary | ICD-10-CM | POA: Diagnosis not present

## 2013-01-02 DIAGNOSIS — J309 Allergic rhinitis, unspecified: Secondary | ICD-10-CM

## 2013-01-02 DIAGNOSIS — M25559 Pain in unspecified hip: Secondary | ICD-10-CM | POA: Diagnosis not present

## 2013-01-03 ENCOUNTER — Ambulatory Visit (INDEPENDENT_AMBULATORY_CARE_PROVIDER_SITE_OTHER): Payer: Medicare Other

## 2013-01-03 DIAGNOSIS — J309 Allergic rhinitis, unspecified: Secondary | ICD-10-CM | POA: Diagnosis not present

## 2013-01-04 DIAGNOSIS — M545 Low back pain, unspecified: Secondary | ICD-10-CM | POA: Diagnosis not present

## 2013-01-04 DIAGNOSIS — M76899 Other specified enthesopathies of unspecified lower limb, excluding foot: Secondary | ICD-10-CM | POA: Diagnosis not present

## 2013-01-04 DIAGNOSIS — M25559 Pain in unspecified hip: Secondary | ICD-10-CM | POA: Diagnosis not present

## 2013-01-09 ENCOUNTER — Ambulatory Visit (INDEPENDENT_AMBULATORY_CARE_PROVIDER_SITE_OTHER): Payer: Medicare Other

## 2013-01-09 DIAGNOSIS — J309 Allergic rhinitis, unspecified: Secondary | ICD-10-CM | POA: Diagnosis not present

## 2013-01-09 DIAGNOSIS — M545 Low back pain, unspecified: Secondary | ICD-10-CM | POA: Diagnosis not present

## 2013-01-09 DIAGNOSIS — M76899 Other specified enthesopathies of unspecified lower limb, excluding foot: Secondary | ICD-10-CM | POA: Diagnosis not present

## 2013-01-09 DIAGNOSIS — M25559 Pain in unspecified hip: Secondary | ICD-10-CM | POA: Diagnosis not present

## 2013-01-11 DIAGNOSIS — M545 Low back pain, unspecified: Secondary | ICD-10-CM | POA: Diagnosis not present

## 2013-01-11 DIAGNOSIS — M25559 Pain in unspecified hip: Secondary | ICD-10-CM | POA: Diagnosis not present

## 2013-01-11 DIAGNOSIS — M76899 Other specified enthesopathies of unspecified lower limb, excluding foot: Secondary | ICD-10-CM | POA: Diagnosis not present

## 2013-01-16 ENCOUNTER — Ambulatory Visit (INDEPENDENT_AMBULATORY_CARE_PROVIDER_SITE_OTHER): Payer: Medicare Other

## 2013-01-16 DIAGNOSIS — M545 Low back pain, unspecified: Secondary | ICD-10-CM | POA: Diagnosis not present

## 2013-01-16 DIAGNOSIS — M76899 Other specified enthesopathies of unspecified lower limb, excluding foot: Secondary | ICD-10-CM | POA: Diagnosis not present

## 2013-01-16 DIAGNOSIS — M25559 Pain in unspecified hip: Secondary | ICD-10-CM | POA: Diagnosis not present

## 2013-01-16 DIAGNOSIS — J309 Allergic rhinitis, unspecified: Secondary | ICD-10-CM

## 2013-01-18 DIAGNOSIS — M545 Low back pain, unspecified: Secondary | ICD-10-CM | POA: Diagnosis not present

## 2013-01-18 DIAGNOSIS — M25559 Pain in unspecified hip: Secondary | ICD-10-CM | POA: Diagnosis not present

## 2013-01-18 DIAGNOSIS — M76899 Other specified enthesopathies of unspecified lower limb, excluding foot: Secondary | ICD-10-CM | POA: Diagnosis not present

## 2013-01-22 DIAGNOSIS — M25559 Pain in unspecified hip: Secondary | ICD-10-CM | POA: Diagnosis not present

## 2013-01-22 DIAGNOSIS — M76899 Other specified enthesopathies of unspecified lower limb, excluding foot: Secondary | ICD-10-CM | POA: Diagnosis not present

## 2013-01-22 DIAGNOSIS — M545 Low back pain, unspecified: Secondary | ICD-10-CM | POA: Diagnosis not present

## 2013-01-23 ENCOUNTER — Encounter: Payer: Self-pay | Admitting: Cardiovascular Disease

## 2013-01-23 ENCOUNTER — Ambulatory Visit (INDEPENDENT_AMBULATORY_CARE_PROVIDER_SITE_OTHER): Payer: Medicare Other | Admitting: Cardiovascular Disease

## 2013-01-23 ENCOUNTER — Other Ambulatory Visit: Payer: Self-pay | Admitting: Internal Medicine

## 2013-01-23 VITALS — BP 118/78 | HR 63 | Ht 65.5 in | Wt 138.0 lb

## 2013-01-23 DIAGNOSIS — E785 Hyperlipidemia, unspecified: Secondary | ICD-10-CM | POA: Diagnosis not present

## 2013-01-23 DIAGNOSIS — H04129 Dry eye syndrome of unspecified lacrimal gland: Secondary | ICD-10-CM | POA: Diagnosis not present

## 2013-01-23 DIAGNOSIS — I2510 Atherosclerotic heart disease of native coronary artery without angina pectoris: Secondary | ICD-10-CM | POA: Diagnosis not present

## 2013-01-23 DIAGNOSIS — H2510 Age-related nuclear cataract, unspecified eye: Secondary | ICD-10-CM | POA: Diagnosis not present

## 2013-01-23 DIAGNOSIS — H52209 Unspecified astigmatism, unspecified eye: Secondary | ICD-10-CM | POA: Diagnosis not present

## 2013-01-23 LAB — HEPATIC FUNCTION PANEL
ALT: 27 U/L (ref 0–35)
Bilirubin, Direct: 0.1 mg/dL (ref 0.0–0.3)
Total Bilirubin: 0.9 mg/dL (ref 0.3–1.2)

## 2013-01-23 LAB — LIPID PANEL: VLDL: 10.2 mg/dL (ref 0.0–40.0)

## 2013-01-23 NOTE — Telephone Encounter (Signed)
Has the refill request come in [facility] mail order for [place]  generic?  She needs the refill.

## 2013-01-23 NOTE — Progress Notes (Signed)
HPI:  72 year old woman presenting for followup evaluation. The patient has coronary artery disease with CABG in 2007 after she presented with non-ST elevation MI. Cardiac catheterization in 2011 demonstrated patency of her bypass grafts and preserved left ventricular function. She also is followed for mild carotid stenosis and hyperlipidemia. Last lipid panel from September 2013 showed a cholesterol of 142, triglycerides 69, HDL 57, and LDL 72. There was no elevation of her AST at 41. Her ALT was normal at 29.  Her exercise has been limited by hip bursitis. She's had no cardiopulmonary symptoms. She specifically denies chest pain, chest pressure, dyspnea, or edema. She remains compliant with her medications. She continues to complain of nocturnal palpitations. She drinks tea occasionally at dinner time. She doesn't take in other forms of caffeine. She has no other complaints today. She specifically denies lightheadedness or syncope.  Outpatient Encounter Prescriptions as of 01/23/2013  Medication Sig Dispense Refill  . aspirin 81 MG tablet Take 81 mg by mouth daily.        Marland Kitchen atorvastatin (LIPITOR) 20 MG tablet TAKE 1 TABLET DAILY  90 tablet  2  . Calcium Carbonate (CALTRATE 600 PO) Take 2 tablets by mouth daily.        . chlorpheniramine (CHLOR-TRIMETON) 4 MG tablet Take 4 mg by mouth 2 (two) times daily as needed.      . Coenzyme Q10 (CO Q-10 PO) Take 50 mg by mouth daily.        . fluticasone (FLONASE) 50 MCG/ACT nasal spray USE 2 SPRAYS IN EACH NOSTRIL EVERY DAY  16 g  3  . levothyroxine (LEVOXYL) 75 MCG tablet Take 1 tablet (75 mcg total) by mouth daily.  90 tablet  1  . meloxicam (MOBIC) 15 MG tablet Take 1 tablet (15 mg total) by mouth daily. TAke 1/4 tablet or as directed  90 tablet  1  . metoprolol tartrate (LOPRESSOR) 25 MG tablet Take 1 tablet (25 mg total) by mouth 2 (two) times daily.  180 tablet  3  . multivitamin (THERAGRAN) per tablet Take 1 tablet by mouth daily.        .  pantoprazole (PROTONIX) 40 MG tablet TAKE 1 TABLET DAILY (PATIENT NEEDS OFFICE VISIT FOR FURTHER REFILLS)  90 tablet  0  . predniSONE (STERAPRED UNI-PAK) 10 MG tablet Take 1 tablet (10 mg total) by mouth as directed. As directed x 6 days  21 tablet  0  . senna-docusate (SENOKOT-S) 8.6-50 MG per tablet Take 1 tablet by mouth daily.        Marland Kitchen zolpidem (AMBIEN) 10 MG tablet Take 0.5 tablets (5 mg total) by mouth at bedtime as needed.  45 tablet  0  . [DISCONTINUED] meloxicam (MOBIC) 15 MG tablet Take 1 tablet (15 mg total) by mouth daily. TAke 1/4 tablet or as directed  90 tablet  1   No facility-administered encounter medications on file as of 01/23/2013.    Allergies  Allergen Reactions  . Clarithromycin   . Codeine     REACTION: nausea  . Levofloxacin     REACTION: nausea  . Morphine   . Naproxen     REACTION: nausea  . Penicillins     REACTION: RASH    Past Medical History  Diagnosis Date  . Hypertension   . COPD (chronic obstructive pulmonary disease)   . Osteopenia   . Aortic stenosis, moderate   . History of DVT (deep vein thrombosis)   . Coronary artery disease  s/p CABGx5 2007  . ALLERGIC RHINITIS   . ASTHMA   . BREAST CANCER     left, 1991; B mastectomy  . CORONARY ARTERY DISEASE   . DIVERTICULOSIS   . DVT   . EROSIVE GASTRITIS   . GERD   . HYPERLIPIDEMIA   . HYPOTHYROIDISM   . Occlusion and stenosis of carotid artery without mention of cerebral infarction   . OSTEOPOROSIS   . RHINOSINUSITIS, RECURRENT    ROS: Negative except as per HPI  BP 118/78  Pulse 63  Ht 5' 5.5" (1.664 m)  Wt 62.596 kg (138 lb)  BMI 22.61 kg/m2  SpO2 98%  PHYSICAL EXAM: Pt is alert and oriented, NAD HEENT: normal Neck: JVP - normal, carotids 2+= with soft bilateral bruits Lungs: CTA bilaterally CV: RRR with grade 2/6 systolic ejection murmur at the right upper sternal border Abd: soft, NT, Positive BS, no hepatomegaly Ext: no C/C/E, distal pulses intact and equal Skin:  warm/dry no rash  EKG:  Normal sinus rhythm 63 beats per minute, within normal limits.  ASSESSMENT AND PLAN: 1. Coronary atherosclerosis, native vessel. She remains stable. She has undergone coronary bypass surgery as detailed above with patency of her grafts by followup cardiac catheterization. We'll continue her same medical regimen. Her medications were reviewed today.  2. Hyperlipidemia. Will repeat lipids and LFTs today such as her AST was mildly elevated when last checked.  3. Palpitations. Consistent with benign nocturnal palpitations. No further evaluation is indicated at this time. She was reassured.  Tonny Bollman 01/23/2013 10:15 AM

## 2013-01-23 NOTE — Patient Instructions (Addendum)
Your physician recommends that you have lab work today: LIPID and LIVER  Your physician wants you to follow-up in: 6 MONTHS with Dr Cooper.  You will receive a reminder letter in the mail two months in advance. If you don't receive a letter, please call our office to schedule the follow-up appointment.  Your physician recommends that you continue on your current medications as directed. Please refer to the Current Medication list given to you today.   

## 2013-01-24 ENCOUNTER — Ambulatory Visit (INDEPENDENT_AMBULATORY_CARE_PROVIDER_SITE_OTHER): Payer: Medicare Other

## 2013-01-24 DIAGNOSIS — J309 Allergic rhinitis, unspecified: Secondary | ICD-10-CM

## 2013-01-24 MED ORDER — ZOLPIDEM TARTRATE 10 MG PO TABS: 5.0000 mg | ORAL_TABLET | Freq: Every evening | ORAL | Status: DC | PRN

## 2013-01-24 NOTE — Telephone Encounter (Signed)
Called pt no answer LMOM rx faxed...lmb

## 2013-01-25 ENCOUNTER — Encounter: Payer: Self-pay | Admitting: Cardiovascular Disease

## 2013-01-25 DIAGNOSIS — M76899 Other specified enthesopathies of unspecified lower limb, excluding foot: Secondary | ICD-10-CM | POA: Diagnosis not present

## 2013-01-25 DIAGNOSIS — M545 Low back pain, unspecified: Secondary | ICD-10-CM | POA: Diagnosis not present

## 2013-01-25 DIAGNOSIS — M25559 Pain in unspecified hip: Secondary | ICD-10-CM | POA: Diagnosis not present

## 2013-01-29 ENCOUNTER — Ambulatory Visit (INDEPENDENT_AMBULATORY_CARE_PROVIDER_SITE_OTHER): Payer: Medicare Other

## 2013-01-29 DIAGNOSIS — J309 Allergic rhinitis, unspecified: Secondary | ICD-10-CM

## 2013-02-01 DIAGNOSIS — M545 Low back pain, unspecified: Secondary | ICD-10-CM | POA: Diagnosis not present

## 2013-02-01 DIAGNOSIS — M25559 Pain in unspecified hip: Secondary | ICD-10-CM | POA: Diagnosis not present

## 2013-02-01 DIAGNOSIS — M76899 Other specified enthesopathies of unspecified lower limb, excluding foot: Secondary | ICD-10-CM | POA: Diagnosis not present

## 2013-02-05 DIAGNOSIS — M545 Low back pain, unspecified: Secondary | ICD-10-CM | POA: Diagnosis not present

## 2013-02-05 DIAGNOSIS — M25559 Pain in unspecified hip: Secondary | ICD-10-CM | POA: Diagnosis not present

## 2013-02-05 DIAGNOSIS — M76899 Other specified enthesopathies of unspecified lower limb, excluding foot: Secondary | ICD-10-CM | POA: Diagnosis not present

## 2013-02-06 ENCOUNTER — Ambulatory Visit (INDEPENDENT_AMBULATORY_CARE_PROVIDER_SITE_OTHER): Payer: Medicare Other

## 2013-02-06 DIAGNOSIS — J309 Allergic rhinitis, unspecified: Secondary | ICD-10-CM

## 2013-02-07 DIAGNOSIS — M25559 Pain in unspecified hip: Secondary | ICD-10-CM | POA: Diagnosis not present

## 2013-02-07 DIAGNOSIS — M545 Low back pain, unspecified: Secondary | ICD-10-CM | POA: Diagnosis not present

## 2013-02-07 DIAGNOSIS — M76899 Other specified enthesopathies of unspecified lower limb, excluding foot: Secondary | ICD-10-CM | POA: Diagnosis not present

## 2013-02-08 ENCOUNTER — Telehealth: Payer: Self-pay | Admitting: Cardiovascular Disease

## 2013-02-08 NOTE — Telephone Encounter (Signed)
Done

## 2013-02-08 NOTE — Telephone Encounter (Signed)
New Prob   Requesting a copy of last lab work results (paper copy).

## 2013-02-13 DIAGNOSIS — M545 Low back pain, unspecified: Secondary | ICD-10-CM | POA: Diagnosis not present

## 2013-02-13 DIAGNOSIS — M25559 Pain in unspecified hip: Secondary | ICD-10-CM | POA: Diagnosis not present

## 2013-02-13 DIAGNOSIS — M76899 Other specified enthesopathies of unspecified lower limb, excluding foot: Secondary | ICD-10-CM | POA: Diagnosis not present

## 2013-02-14 ENCOUNTER — Ambulatory Visit (INDEPENDENT_AMBULATORY_CARE_PROVIDER_SITE_OTHER): Payer: Medicare Other

## 2013-02-14 DIAGNOSIS — M76899 Other specified enthesopathies of unspecified lower limb, excluding foot: Secondary | ICD-10-CM | POA: Diagnosis not present

## 2013-02-14 DIAGNOSIS — J309 Allergic rhinitis, unspecified: Secondary | ICD-10-CM

## 2013-02-21 ENCOUNTER — Ambulatory Visit (INDEPENDENT_AMBULATORY_CARE_PROVIDER_SITE_OTHER): Payer: Medicare Other

## 2013-02-21 DIAGNOSIS — J309 Allergic rhinitis, unspecified: Secondary | ICD-10-CM | POA: Diagnosis not present

## 2013-02-26 ENCOUNTER — Ambulatory Visit (INDEPENDENT_AMBULATORY_CARE_PROVIDER_SITE_OTHER): Payer: Medicare Other

## 2013-02-26 DIAGNOSIS — J309 Allergic rhinitis, unspecified: Secondary | ICD-10-CM

## 2013-03-05 ENCOUNTER — Encounter: Payer: Self-pay | Admitting: *Deleted

## 2013-03-05 ENCOUNTER — Ambulatory Visit (INDEPENDENT_AMBULATORY_CARE_PROVIDER_SITE_OTHER): Payer: Medicare Other

## 2013-03-05 DIAGNOSIS — J309 Allergic rhinitis, unspecified: Secondary | ICD-10-CM

## 2013-03-12 ENCOUNTER — Telehealth: Payer: Self-pay | Admitting: Internal Medicine

## 2013-03-12 DIAGNOSIS — M810 Age-related osteoporosis without current pathological fracture: Secondary | ICD-10-CM

## 2013-03-12 NOTE — Telephone Encounter (Signed)
Order for a DEXA scan placed to [facility] as requested

## 2013-03-12 NOTE — Telephone Encounter (Signed)
The patient called to schedule her own bone density for April 30.  Garald Braver is going to have her reschedule to after May 8 due to her last Dexa was Mar 23 2011.   [place] needs an order faxed to [phone].

## 2013-03-13 ENCOUNTER — Ambulatory Visit (INDEPENDENT_AMBULATORY_CARE_PROVIDER_SITE_OTHER): Payer: Medicare Other

## 2013-03-13 DIAGNOSIS — J309 Allergic rhinitis, unspecified: Secondary | ICD-10-CM

## 2013-03-21 ENCOUNTER — Ambulatory Visit (INDEPENDENT_AMBULATORY_CARE_PROVIDER_SITE_OTHER): Payer: Medicare Other

## 2013-03-21 DIAGNOSIS — J309 Allergic rhinitis, unspecified: Secondary | ICD-10-CM | POA: Diagnosis not present

## 2013-03-23 DIAGNOSIS — M899 Disorder of bone, unspecified: Secondary | ICD-10-CM | POA: Diagnosis not present

## 2013-03-28 ENCOUNTER — Encounter: Payer: Self-pay | Admitting: Internal Medicine

## 2013-03-30 ENCOUNTER — Other Ambulatory Visit: Payer: Self-pay | Admitting: Internal Medicine

## 2013-04-04 ENCOUNTER — Ambulatory Visit (INDEPENDENT_AMBULATORY_CARE_PROVIDER_SITE_OTHER): Payer: Medicare Other

## 2013-04-04 DIAGNOSIS — J309 Allergic rhinitis, unspecified: Secondary | ICD-10-CM | POA: Diagnosis not present

## 2013-04-11 ENCOUNTER — Telehealth: Payer: Self-pay | Admitting: *Deleted

## 2013-04-11 ENCOUNTER — Ambulatory Visit: Payer: Medicare Other

## 2013-04-11 ENCOUNTER — Ambulatory Visit (INDEPENDENT_AMBULATORY_CARE_PROVIDER_SITE_OTHER): Payer: Medicare Other

## 2013-04-11 DIAGNOSIS — J309 Allergic rhinitis, unspecified: Secondary | ICD-10-CM | POA: Diagnosis not present

## 2013-04-11 MED ORDER — ZOLPIDEM TARTRATE 10 MG PO TABS: 5.0000 mg | ORAL_TABLET | Freq: Every evening | ORAL | Status: DC | PRN

## 2013-04-11 NOTE — Telephone Encounter (Signed)
Pt requesting refill of generic Ambien be sent to [facility]-last written 01/23/2013 #45 with 0 refill-please advise.

## 2013-04-11 NOTE — Telephone Encounter (Signed)
ok 

## 2013-04-11 NOTE — Telephone Encounter (Signed)
[facility].../lmb 

## 2013-04-13 ENCOUNTER — Telehealth: Payer: Self-pay | Admitting: *Deleted

## 2013-04-13 NOTE — Telephone Encounter (Signed)
[facility] spoke with Elease Hashimoto inform her what the patient call us about & verify that the rx that we sent on 04/11/13 was for zolpidem and not Dareen Gutzwiller name ambien. Rep stated when they received scripts it always show Pascale Maves name until its been filled. Does not show it was written for medical necessary. It did show quanity was change to 30n due to insurance. She could only get # 60 in a 90 day period unless md does a prior authorization. Was transferred to PA dept gave clinical information. Zolpidem was approve for #45 w/ case ID # [id number]. Called pt back gave her updated status....Raechel Chute

## 2013-04-13 NOTE — Telephone Encounter (Signed)
Pt states she spoke with Jessi [facility] this am they told her that md sent in rx for brand name ambien & she will be bill for $134. Pt states she never pay that much and has always taking zolpidem. Inform pt we sent rx for zolpidem. Pt states she told them to cancel the order and she will have md to call. Inform pt will call her back once i speak with [facility] [phone] Member ID # [id number]....Raechel Chute

## 2013-04-18 ENCOUNTER — Ambulatory Visit: Payer: Medicare Other

## 2013-04-19 ENCOUNTER — Telehealth: Payer: Self-pay | Admitting: Internal Medicine

## 2013-04-19 MED ORDER — ZOLPIDEM TARTRATE 10 MG PO TABS: 5.0000 mg | ORAL_TABLET | Freq: Every evening | ORAL | Status: DC | PRN

## 2013-04-19 NOTE — Telephone Encounter (Signed)
Pt called very upset that Zolpidem has not been sent to [facility].  The case number is [id number].  She wants a new RX written for Zolpidem (not Ambien) for 45 days.  She wants a call when it has been sent to [facility] so she can then call them to check to see that it was done correctly.

## 2013-04-19 NOTE — Telephone Encounter (Signed)
Called pt to verify because rx was fax on 04/11/13 which needed a PA & was done on 04/13/13 and was approve. Pt states she spoke with donna last night and stated they didn't get an rx. Inform pt will [facility] to see what is going on. [facility] spoke with Joni Reining. Gave her the case ID # for PA on the zolpidem. Joni Reining states that [facility] had stop the [facility]em rx because it needed a PA they never did restart when it was approve. Need ing new rx for zolpidem sent. Sending new rx...lmb

## 2013-04-19 NOTE — Telephone Encounter (Signed)
Called pt no answer LMOM update info for her zolpidem...lmb

## 2013-04-23 ENCOUNTER — Ambulatory Visit (INDEPENDENT_AMBULATORY_CARE_PROVIDER_SITE_OTHER): Payer: Medicare Other

## 2013-04-23 DIAGNOSIS — J309 Allergic rhinitis, unspecified: Secondary | ICD-10-CM | POA: Diagnosis not present

## 2013-05-02 ENCOUNTER — Ambulatory Visit (INDEPENDENT_AMBULATORY_CARE_PROVIDER_SITE_OTHER): Payer: Medicare Other

## 2013-05-02 DIAGNOSIS — J309 Allergic rhinitis, unspecified: Secondary | ICD-10-CM

## 2013-05-09 ENCOUNTER — Ambulatory Visit (INDEPENDENT_AMBULATORY_CARE_PROVIDER_SITE_OTHER): Payer: Medicare Other

## 2013-05-09 DIAGNOSIS — J309 Allergic rhinitis, unspecified: Secondary | ICD-10-CM

## 2013-05-16 ENCOUNTER — Ambulatory Visit: Payer: Medicare Other

## 2013-05-22 ENCOUNTER — Ambulatory Visit (INDEPENDENT_AMBULATORY_CARE_PROVIDER_SITE_OTHER): Payer: Medicare Other

## 2013-05-22 DIAGNOSIS — J309 Allergic rhinitis, unspecified: Secondary | ICD-10-CM | POA: Diagnosis not present

## 2013-05-23 ENCOUNTER — Ambulatory Visit (INDEPENDENT_AMBULATORY_CARE_PROVIDER_SITE_OTHER): Payer: Medicare Other

## 2013-05-23 DIAGNOSIS — J309 Allergic rhinitis, unspecified: Secondary | ICD-10-CM | POA: Diagnosis not present

## 2013-05-30 ENCOUNTER — Ambulatory Visit (INDEPENDENT_AMBULATORY_CARE_PROVIDER_SITE_OTHER): Payer: Medicare Other

## 2013-05-30 DIAGNOSIS — J309 Allergic rhinitis, unspecified: Secondary | ICD-10-CM | POA: Diagnosis not present

## 2013-06-06 ENCOUNTER — Ambulatory Visit: Payer: Medicare Other

## 2013-06-07 ENCOUNTER — Ambulatory Visit (INDEPENDENT_AMBULATORY_CARE_PROVIDER_SITE_OTHER): Payer: Medicare Other

## 2013-06-07 DIAGNOSIS — J309 Allergic rhinitis, unspecified: Secondary | ICD-10-CM

## 2013-06-13 ENCOUNTER — Ambulatory Visit (INDEPENDENT_AMBULATORY_CARE_PROVIDER_SITE_OTHER): Payer: Medicare Other

## 2013-06-13 DIAGNOSIS — J309 Allergic rhinitis, unspecified: Secondary | ICD-10-CM | POA: Diagnosis not present

## 2013-06-14 ENCOUNTER — Ambulatory Visit: Payer: Medicare Other

## 2013-06-20 ENCOUNTER — Ambulatory Visit (INDEPENDENT_AMBULATORY_CARE_PROVIDER_SITE_OTHER): Payer: Medicare Other

## 2013-06-20 ENCOUNTER — Other Ambulatory Visit: Payer: Self-pay

## 2013-06-20 DIAGNOSIS — J309 Allergic rhinitis, unspecified: Secondary | ICD-10-CM | POA: Diagnosis not present

## 2013-06-25 ENCOUNTER — Other Ambulatory Visit: Payer: Self-pay | Admitting: Cardiovascular Disease

## 2013-06-27 ENCOUNTER — Ambulatory Visit (INDEPENDENT_AMBULATORY_CARE_PROVIDER_SITE_OTHER): Payer: Medicare Other

## 2013-06-27 DIAGNOSIS — J309 Allergic rhinitis, unspecified: Secondary | ICD-10-CM

## 2013-06-28 ENCOUNTER — Telehealth: Payer: Self-pay | Admitting: Internal Medicine

## 2013-06-28 ENCOUNTER — Other Ambulatory Visit: Payer: Self-pay | Admitting: Internal Medicine

## 2013-06-29 MED ORDER — PANTOPRAZOLE SODIUM 40 MG PO TBEC: 40.0000 mg | DELAYED_RELEASE_TABLET | Freq: Every day | ORAL | Status: DC

## 2013-06-29 NOTE — Telephone Encounter (Signed)
Not Happy she did not get a call back yesterday. Only has 2 pills left. Can she please be called back today?

## 2013-06-29 NOTE — Telephone Encounter (Signed)
I sent in rx to [facility]. Patient does not have money for office visit. But needs her meds.

## 2013-07-04 ENCOUNTER — Ambulatory Visit (INDEPENDENT_AMBULATORY_CARE_PROVIDER_SITE_OTHER): Payer: Medicare Other

## 2013-07-04 DIAGNOSIS — J309 Allergic rhinitis, unspecified: Secondary | ICD-10-CM | POA: Diagnosis not present

## 2013-07-09 DIAGNOSIS — L570 Actinic keratosis: Secondary | ICD-10-CM | POA: Diagnosis not present

## 2013-07-09 DIAGNOSIS — L820 Inflamed seborrheic keratosis: Secondary | ICD-10-CM | POA: Diagnosis not present

## 2013-07-09 DIAGNOSIS — D485 Neoplasm of uncertain behavior of skin: Secondary | ICD-10-CM | POA: Diagnosis not present

## 2013-07-09 DIAGNOSIS — Z85828 Personal history of other malignant neoplasm of skin: Secondary | ICD-10-CM | POA: Diagnosis not present

## 2013-07-11 ENCOUNTER — Ambulatory Visit (INDEPENDENT_AMBULATORY_CARE_PROVIDER_SITE_OTHER): Payer: Medicare Other

## 2013-07-11 DIAGNOSIS — J309 Allergic rhinitis, unspecified: Secondary | ICD-10-CM | POA: Diagnosis not present

## 2013-07-12 ENCOUNTER — Telehealth: Payer: Self-pay | Admitting: Internal Medicine

## 2013-07-12 MED ORDER — PANTOPRAZOLE SODIUM 40 MG PO TBEC: 40.0000 mg | DELAYED_RELEASE_TABLET | Freq: Every day | ORAL | Status: DC

## 2013-07-12 NOTE — Telephone Encounter (Signed)
Sent 12 day supply of Pantoprazole to pharamcy and called patient to let her know.  Patient understood and agreed.

## 2013-07-17 ENCOUNTER — Ambulatory Visit (INDEPENDENT_AMBULATORY_CARE_PROVIDER_SITE_OTHER): Payer: Medicare Other

## 2013-07-17 DIAGNOSIS — J309 Allergic rhinitis, unspecified: Secondary | ICD-10-CM | POA: Diagnosis not present

## 2013-07-18 ENCOUNTER — Ambulatory Visit: Payer: Medicare Other

## 2013-07-18 DIAGNOSIS — H5340 Unspecified visual field defects: Secondary | ICD-10-CM | POA: Diagnosis not present

## 2013-07-18 DIAGNOSIS — E0500 Thyrotoxicosis with diffuse goiter without thyrotoxic crisis or storm: Secondary | ICD-10-CM | POA: Diagnosis not present

## 2013-07-18 DIAGNOSIS — H04129 Dry eye syndrome of unspecified lacrimal gland: Secondary | ICD-10-CM | POA: Diagnosis not present

## 2013-07-24 ENCOUNTER — Ambulatory Visit (INDEPENDENT_AMBULATORY_CARE_PROVIDER_SITE_OTHER): Payer: Medicare Other

## 2013-07-24 DIAGNOSIS — J309 Allergic rhinitis, unspecified: Secondary | ICD-10-CM | POA: Diagnosis not present

## 2013-07-25 ENCOUNTER — Ambulatory Visit: Payer: Medicare Other

## 2013-07-30 ENCOUNTER — Ambulatory Visit (INDEPENDENT_AMBULATORY_CARE_PROVIDER_SITE_OTHER): Payer: Medicare Other

## 2013-07-30 DIAGNOSIS — J309 Allergic rhinitis, unspecified: Secondary | ICD-10-CM

## 2013-08-01 ENCOUNTER — Ambulatory Visit: Payer: Medicare Other

## 2013-08-08 ENCOUNTER — Ambulatory Visit (INDEPENDENT_AMBULATORY_CARE_PROVIDER_SITE_OTHER): Payer: Medicare Other

## 2013-08-08 DIAGNOSIS — J309 Allergic rhinitis, unspecified: Secondary | ICD-10-CM

## 2013-08-09 ENCOUNTER — Ambulatory Visit (INDEPENDENT_AMBULATORY_CARE_PROVIDER_SITE_OTHER): Payer: Medicare Other | Admitting: Internal Medicine

## 2013-08-09 ENCOUNTER — Telehealth: Payer: Self-pay | Admitting: Cardiovascular Disease

## 2013-08-09 ENCOUNTER — Encounter: Payer: Self-pay | Admitting: Internal Medicine

## 2013-08-09 VITALS — BP 128/82 | HR 64 | Temp 97.8°F | Ht 65.5 in | Wt 140.4 lb

## 2013-08-09 DIAGNOSIS — J019 Acute sinusitis, unspecified: Secondary | ICD-10-CM

## 2013-08-09 DIAGNOSIS — J309 Allergic rhinitis, unspecified: Secondary | ICD-10-CM

## 2013-08-09 MED ORDER — METHYLPREDNISOLONE ACETATE 80 MG/ML IJ SUSP
80.0000 mg | Freq: Once | INTRAMUSCULAR | Status: AC
  Administered 2013-08-09: 80 mg via INTRAMUSCULAR

## 2013-08-09 MED ORDER — PREDNISONE (PAK) 10 MG PO TABS: 10.0000 mg | ORAL_TABLET | ORAL | Status: DC

## 2013-08-09 MED ORDER — HYDROCODONE-HOMATROPINE 5-1.5 MG/5ML PO SYRP: 5.0000 mL | ORAL_SOLUTION | Freq: Three times a day (TID) | ORAL | Status: DC | PRN

## 2013-08-09 MED ORDER — DOXYCYCLINE HYCLATE 100 MG PO TABS: 100.0000 mg | ORAL_TABLET | Freq: Two times a day (BID) | ORAL | Status: DC

## 2013-08-09 MED ORDER — FLUTICASONE PROPIONATE 50 MCG/ACT NA SUSP: NASAL | Status: DC

## 2013-08-09 NOTE — Patient Instructions (Signed)

## 2013-08-09 NOTE — Progress Notes (Signed)
HPI  Pt presents to the clinic with c/o headache, nasal congestion, cough and fatigue. These symptoms started about 1 week ago. [name] has been running low grade fevers. [name] is also blowing out thick beige mucous from her nose. [name] is taking the "yellow pill" the pharmacist recommended, cough drops and her Flonase. [name] denies chills or body aches. [name] has a history of chronic sinusitis. [name] is typically followed by Dr. Maple Hudson for her sinus issues. [name] has not had sick contacts that [name] is aware.  Review of Systems    Past Medical History  Diagnosis Date  . Hypertension   . COPD (chronic obstructive pulmonary disease)   . Osteopenia   . Aortic stenosis, moderate   . History of DVT (deep vein thrombosis)   . Coronary artery disease     s/p CABGx5 2007  . ALLERGIC RHINITIS   . ASTHMA   . BREAST CANCER     left, 1991; B mastectomy  . CORONARY ARTERY DISEASE   . DIVERTICULOSIS   . DVT   . EROSIVE GASTRITIS   . GERD   . HYPERLIPIDEMIA   . HYPOTHYROIDISM   . Occlusion and stenosis of carotid artery without mention of cerebral infarction   . OSTEOPOROSIS   . RHINOSINUSITIS, RECURRENT     Family History  Problem Relation Age of Onset  . Colon cancer Sister   . Colon cancer Sister   . Cancer Mother     breast cancer  . Lung disease Mother   . Emphysema Father     History   Social History  . Marital Status: Divorced    Spouse Name: N/A    Number of Children: N/A  . Years of Education: N/A   Occupational History  . Not on file.   Social History Main Topics  . Smoking status: Never Smoker   . Smokeless tobacco: Never Used  . Alcohol Use: No  . Drug Use: No  . Sexual Activity: Not on file   Other Topics Concern  . 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 Clarithromycin   . Codeine     REACTION: nausea  . Levofloxacin     REACTION:  nausea  . Morphine   . Naproxen     REACTION: nausea  . Penicillins     REACTION: RASH     Constitutional: Positive headache, fatigue and fever. Denies abrupt weight changes.  HEENT:  Positive eye pain, pressure behind the eyes, facial pain, nasal congestion and sore throat. Denies eye redness, ear pain, ringing in the ears, wax buildup, runny nose or bloody nose. Respiratory: Positive cough. Denies difficulty breathing or shortness of breath.  Cardiovascular: Denies chest pain, chest tightness, palpitations or swelling in the hands or feet.   No other specific complaints in a complete review of systems (except as listed in HPI above).  Objective:    BP 128/82  Pulse 64  Temp(Src) 97.8 F (36.6 C) (Oral)  Ht 5' 5.5" (1.664 m)  Wt 140 lb 6.4 oz (63.685 kg)  BMI 23 kg/m2  SpO2 98% Wt Readings from Last 3 Encounters:  08/09/13 140 lb 6.4 oz (63.685 kg)  01/23/13 138 lb (62.596 kg)  11/30/12 140 lb (63.504 kg)    General: Appears her stated age, well developed, well nourished in NAD. HEENT: Head: normal shape and  size, maxillary sinuses tender to palpation; Eyes: sclera white, no icterus, conjunctiva pink, PERRLA and EOMs intact; Ears: Tm's gray and intact, normal light reflex; Nose: mucosa pink and moist, septum midline; Throat/Mouth: + PND. Teeth present, mucosa pink and moist, no exudate noted, no lesions or ulcerations noted.  Neck: Mild cervical lymphadenopathy. Neck supple, trachea midline. No massses, lumps or thyromegaly present.  Cardiovascular: Normal rate and rhythm. S1,S2 noted.  No murmur, rubs or gallops noted. No JVD or BLE edema. No carotid bruits noted. Pulmonary/Chest: Normal effort and positive vesicular breath sounds. No respiratory distress. No wheezes, rales or ronchi noted.      Assessment & Plan:   Acute bacterial sinusitis  Can use a Neti Pot which can be purchased from your local drug store. Continue antihistamine and flonase Doxycycline BID x 10  days eRx for pred taper- try the abs first, if  No better in 3-5 days, start the pred taper 80 mg Depo IM  RTC as needed or if symptoms persist.

## 2013-08-09 NOTE — Telephone Encounter (Signed)
Pt was called from this office to remind her of the appointment for tomorrow 9/26 th with Dr. [name]. Pt states she just asked if she had appointment for labs before tomorrow's appointment. Pt was made aware that on the last office note with Dr. [name] , MD did not mention for pt to get labs prior appointment. Pt said that she will see what labs Dr. [name] wants to get when she comes for appointment.

## 2013-08-09 NOTE — Telephone Encounter (Signed)
New Problem  Pt request a lab appt today before her appt 9/26. Sent to triage

## 2013-08-09 NOTE — Telephone Encounter (Signed)
Pt calls in today wanting lab work drawn before seeing Dr. [name] tomorrow. I checked pt last ov & lab results. It did not appear she was due any lab work. Pt is aware of 01/2013 cholesterol results again as she did not write the numbers before  Mylo Red RN

## 2013-08-10 ENCOUNTER — Encounter: Payer: Self-pay | Admitting: Cardiovascular Disease

## 2013-08-10 ENCOUNTER — Ambulatory Visit (INDEPENDENT_AMBULATORY_CARE_PROVIDER_SITE_OTHER): Payer: Medicare Other | Admitting: Cardiovascular Disease

## 2013-08-10 VITALS — BP 116/73 | HR 67 | Ht 65.5 in | Wt 140.0 lb

## 2013-08-10 DIAGNOSIS — I2510 Atherosclerotic heart disease of native coronary artery without angina pectoris: Secondary | ICD-10-CM

## 2013-08-10 DIAGNOSIS — I359 Nonrheumatic aortic valve disorder, unspecified: Secondary | ICD-10-CM | POA: Diagnosis not present

## 2013-08-10 NOTE — Patient Instructions (Addendum)
Your physician wants you to follow-up in: 6 MONTHS with Dr [name].  You will receive a reminder letter in the mail two months in advance. If you don't receive a letter, please call our office to schedule the follow-up appointment.  Your physician recommends that you return for a FASTING LIPID and LIVER profile in 6 MONTHS--nothing to eat or drink after midnight, lab opens at 7:30  Your physician recommends that you continue on your current medications as directed. Please refer to the Current Medication list given to you today.

## 2013-08-10 NOTE — Progress Notes (Signed)
HPI:  72 year old woman presenting for followup evaluation. The patient has coronary artery disease with history of CABG in 2007. She underwent cardiac catheterization in 2011 demonstrating patency of all of her bypass grafts and preserved LV function. Last lipid panel from March 2014 showed a total cholesterol of 123, triglycerides 51, HDL 46, and LDL 67. Her liver function tests were normal.  The patient has had an upper respiratory infection. She just saw her primary care physician yesterday for this. She otherwise is doing well. She has no cardiac related complaints. She's had no recent palpitations. She denies chest pain, chest pressure, shortness of breath, or leg swelling. She's been compliant with her medications.  Outpatient Encounter Prescriptions as of 08/10/2013  Medication Sig Dispense Refill  . aspirin 81 MG tablet Take 81 mg by mouth daily.        Marland Kitchen atorvastatin (LIPITOR) 20 MG tablet TAKE 1 TABLET DAILY  90 tablet  1  . Calcium Carbonate (CALTRATE 600 PO) Take 2 tablets by mouth daily.        . chlorpheniramine (CHLOR-TRIMETON) 4 MG tablet Take 4 mg by mouth 2 (two) times daily as needed.      . Coenzyme Q10 (CO Q-10 PO) Take 50 mg by mouth daily.        Marland Kitchen doxycycline (VIBRA-TABS) 100 MG tablet Take 1 tablet (100 mg total) by mouth 2 (two) times daily.  20 tablet  0  . fluticasone (FLONASE) 50 MCG/ACT nasal spray USE 2 SPRAYS IN EACH NOSTRIL EVERY DAY  16 g  3  . HYDROcodone-homatropine (HYCODAN) 5-1.5 MG/5ML syrup Take 5 mLs by mouth every 8 (eight) hours as needed for cough.  120 mL  0  . levothyroxine (SYNTHROID, LEVOTHROID) 75 MCG tablet TAKE 1 TABLET DAILY  90 tablet  1  . meloxicam (MOBIC) 15 MG tablet Take 1 tablet (15 mg total) by mouth daily. TAke 1/4 tablet or as directed  90 tablet  1  . metoprolol tartrate (LOPRESSOR) 25 MG tablet Take 1 tablet (25 mg total) by mouth 2 (two) times daily.  180 tablet  3  . multivitamin (THERAGRAN) per tablet Take 1 tablet by mouth  daily.        . pantoprazole (PROTONIX) 40 MG tablet Take 1 tablet (40 mg total) by mouth daily.  12 tablet  0  . predniSONE (STERAPRED UNI-PAK) 10 MG tablet Take 1 tablet (10 mg total) by mouth as directed. As directed x 6 days  21 tablet  0  . senna-docusate (SENOKOT-S) 8.6-50 MG per tablet Take 1 tablet by mouth daily.        Marland Kitchen zolpidem (AMBIEN) 10 MG tablet Take 0.5 tablets (5 mg total) by mouth at bedtime as needed.  45 tablet  1   No facility-administered encounter medications on file as of 08/10/2013.    Allergies  Allergen Reactions  . Clarithromycin   . Codeine     REACTION: nausea  . Levofloxacin     REACTION: nausea  . Morphine   . Naproxen     REACTION: nausea  . Penicillins     REACTION: RASH    Past Medical History  Diagnosis Date  . Hypertension   . COPD (chronic obstructive pulmonary disease)   . Osteopenia   . Aortic stenosis, moderate   . History of DVT (deep vein thrombosis)   . Coronary artery disease     s/p CABGx5 2007  . ALLERGIC RHINITIS   . ASTHMA   . BREAST  CANCER     left, 1991; B mastectomy  . CORONARY ARTERY DISEASE   . DIVERTICULOSIS   . DVT   . EROSIVE GASTRITIS   . GERD   . HYPERLIPIDEMIA   . HYPOTHYROIDISM   . Occlusion and stenosis of carotid artery without mention of cerebral infarction   . OSTEOPOROSIS   . RHINOSINUSITIS, RECURRENT     ROS: Negative except as per HPI  BP 116/73  Pulse 67  Ht 5' 5.5" (1.664 m)  Wt 63.504 kg (140 lb)  BMI 22.93 kg/m2  PHYSICAL EXAM: Pt is alert and oriented, NAD HEENT: normal Neck: JVP - normal, carotids 2+=  Lungs: CTA bilaterally CV: RRR with grade 3/6 harsh early to mid peaking systolic murmur with preserved A2 component of the second heart sound  Abd: soft, NT, Positive BS, no hepatomegaly Ext: no C/C/E, distal pulses intact and equal Skin: warm/dry no rash  EKG:  Normal sinus rhythm 65 beats per minute, possible left atrial enlargement, age indeterminate inferior infarct, possible  anterior infarct age undetermined.  ASSESSMENT AND PLAN: 1. Coronary atherosclerosis status post CABG. The patient is stable without anginal symptoms. Her medications were reviewed and they are appropriate for CAD with the use of aspirin, atorvastatin, and metoprolol. She will followup in 6 months.  2. Hyperlipidemia. The patient was treated with atorvastatin. Last lipids were reviewed as above. She will be due for followup labs before her return visit in 6 months.  Tonny Bollman 08/10/2013 5:34 PM

## 2013-08-15 ENCOUNTER — Ambulatory Visit: Payer: Medicare Other

## 2013-08-17 ENCOUNTER — Ambulatory Visit (INDEPENDENT_AMBULATORY_CARE_PROVIDER_SITE_OTHER): Payer: Medicare Other

## 2013-08-17 DIAGNOSIS — J309 Allergic rhinitis, unspecified: Secondary | ICD-10-CM | POA: Diagnosis not present

## 2013-08-21 ENCOUNTER — Ambulatory Visit (INDEPENDENT_AMBULATORY_CARE_PROVIDER_SITE_OTHER): Payer: Medicare Other

## 2013-08-21 DIAGNOSIS — J309 Allergic rhinitis, unspecified: Secondary | ICD-10-CM | POA: Diagnosis not present

## 2013-08-22 DIAGNOSIS — D485 Neoplasm of uncertain behavior of skin: Secondary | ICD-10-CM | POA: Diagnosis not present

## 2013-08-22 DIAGNOSIS — L570 Actinic keratosis: Secondary | ICD-10-CM | POA: Diagnosis not present

## 2013-08-22 DIAGNOSIS — Z85828 Personal history of other malignant neoplasm of skin: Secondary | ICD-10-CM | POA: Diagnosis not present

## 2013-08-22 DIAGNOSIS — L821 Other seborrheic keratosis: Secondary | ICD-10-CM | POA: Diagnosis not present

## 2013-08-28 ENCOUNTER — Ambulatory Visit (INDEPENDENT_AMBULATORY_CARE_PROVIDER_SITE_OTHER): Payer: Medicare Other

## 2013-08-28 DIAGNOSIS — J309 Allergic rhinitis, unspecified: Secondary | ICD-10-CM

## 2013-08-28 DIAGNOSIS — Z23 Encounter for immunization: Secondary | ICD-10-CM | POA: Diagnosis not present

## 2013-09-05 ENCOUNTER — Ambulatory Visit (INDEPENDENT_AMBULATORY_CARE_PROVIDER_SITE_OTHER): Payer: Medicare Other

## 2013-09-05 DIAGNOSIS — Z23 Encounter for immunization: Secondary | ICD-10-CM

## 2013-09-11 ENCOUNTER — Ambulatory Visit (INDEPENDENT_AMBULATORY_CARE_PROVIDER_SITE_OTHER): Payer: Medicare Other

## 2013-09-11 DIAGNOSIS — J309 Allergic rhinitis, unspecified: Secondary | ICD-10-CM

## 2013-09-17 ENCOUNTER — Ambulatory Visit (INDEPENDENT_AMBULATORY_CARE_PROVIDER_SITE_OTHER): Payer: Medicare Other

## 2013-09-17 DIAGNOSIS — J309 Allergic rhinitis, unspecified: Secondary | ICD-10-CM

## 2013-09-20 DIAGNOSIS — M76829 Posterior tibial tendinitis, unspecified leg: Secondary | ICD-10-CM | POA: Diagnosis not present

## 2013-09-21 ENCOUNTER — Ambulatory Visit (HOSPITAL_COMMUNITY): Payer: Medicare Other | Attending: Cardiology

## 2013-09-21 DIAGNOSIS — I2510 Atherosclerotic heart disease of native coronary artery without angina pectoris: Secondary | ICD-10-CM | POA: Diagnosis not present

## 2013-09-21 DIAGNOSIS — R0989 Other specified symptoms and signs involving the circulatory and respiratory systems: Secondary | ICD-10-CM | POA: Diagnosis not present

## 2013-09-21 DIAGNOSIS — Z951 Presence of aortocoronary bypass graft: Secondary | ICD-10-CM | POA: Insufficient documentation

## 2013-09-21 DIAGNOSIS — E785 Hyperlipidemia, unspecified: Secondary | ICD-10-CM | POA: Insufficient documentation

## 2013-09-21 DIAGNOSIS — I658 Occlusion and stenosis of other precerebral arteries: Secondary | ICD-10-CM | POA: Insufficient documentation

## 2013-09-21 DIAGNOSIS — I6529 Occlusion and stenosis of unspecified carotid artery: Secondary | ICD-10-CM | POA: Insufficient documentation

## 2013-09-24 ENCOUNTER — Ambulatory Visit: Payer: Medicare Other

## 2013-09-27 ENCOUNTER — Ambulatory Visit (INDEPENDENT_AMBULATORY_CARE_PROVIDER_SITE_OTHER): Payer: Medicare Other

## 2013-09-27 DIAGNOSIS — J309 Allergic rhinitis, unspecified: Secondary | ICD-10-CM

## 2013-10-01 ENCOUNTER — Ambulatory Visit (INDEPENDENT_AMBULATORY_CARE_PROVIDER_SITE_OTHER): Payer: Medicare Other

## 2013-10-01 DIAGNOSIS — J309 Allergic rhinitis, unspecified: Secondary | ICD-10-CM | POA: Diagnosis not present

## 2013-10-03 ENCOUNTER — Telehealth: Payer: Self-pay | Admitting: Internal Medicine

## 2013-10-03 ENCOUNTER — Ambulatory Visit (INDEPENDENT_AMBULATORY_CARE_PROVIDER_SITE_OTHER): Payer: Medicare Other

## 2013-10-03 DIAGNOSIS — J309 Allergic rhinitis, unspecified: Secondary | ICD-10-CM | POA: Diagnosis not present

## 2013-10-03 MED ORDER — ZOSTER VACCINE LIVE 19400 UNT/0.65ML ~~LOC~~ SOLR: 0.6500 mL | Freq: Once | SUBCUTANEOUS | Status: DC

## 2013-10-03 NOTE — Telephone Encounter (Signed)
Notified pt rx sent to pharmacy for her shingles...[name]

## 2013-10-03 NOTE — Telephone Encounter (Signed)
Patient needs an rx sent shingles inj sent to pharmacy - [facility].  Patient states that insurance will cover but will only cover if she gets the vaccine from the pharm to bring in to be administered.

## 2013-10-09 ENCOUNTER — Ambulatory Visit (INDEPENDENT_AMBULATORY_CARE_PROVIDER_SITE_OTHER): Payer: Medicare Other

## 2013-10-09 DIAGNOSIS — J309 Allergic rhinitis, unspecified: Secondary | ICD-10-CM

## 2013-10-15 ENCOUNTER — Ambulatory Visit (INDEPENDENT_AMBULATORY_CARE_PROVIDER_SITE_OTHER): Payer: Medicare Other

## 2013-10-15 DIAGNOSIS — J309 Allergic rhinitis, unspecified: Secondary | ICD-10-CM | POA: Diagnosis not present

## 2013-10-16 ENCOUNTER — Encounter: Payer: Self-pay | Admitting: Internal Medicine

## 2013-10-17 ENCOUNTER — Ambulatory Visit: Payer: Medicare Other

## 2013-10-22 ENCOUNTER — Ambulatory Visit (INDEPENDENT_AMBULATORY_CARE_PROVIDER_SITE_OTHER): Payer: Medicare Other

## 2013-10-22 DIAGNOSIS — J309 Allergic rhinitis, unspecified: Secondary | ICD-10-CM

## 2013-10-24 ENCOUNTER — Ambulatory Visit: Payer: Medicare Other

## 2013-10-25 DIAGNOSIS — M7750 Other enthesopathy of unspecified foot: Secondary | ICD-10-CM | POA: Diagnosis not present

## 2013-10-29 ENCOUNTER — Ambulatory Visit: Payer: Medicare Other

## 2013-10-31 ENCOUNTER — Encounter: Payer: Self-pay | Admitting: Internal Medicine

## 2013-10-31 ENCOUNTER — Ambulatory Visit (INDEPENDENT_AMBULATORY_CARE_PROVIDER_SITE_OTHER): Payer: Medicare Other

## 2013-10-31 DIAGNOSIS — J309 Allergic rhinitis, unspecified: Secondary | ICD-10-CM

## 2013-10-31 DIAGNOSIS — M7750 Other enthesopathy of unspecified foot: Secondary | ICD-10-CM | POA: Diagnosis not present

## 2013-11-05 ENCOUNTER — Ambulatory Visit (INDEPENDENT_AMBULATORY_CARE_PROVIDER_SITE_OTHER): Payer: Medicare Other

## 2013-11-05 DIAGNOSIS — J309 Allergic rhinitis, unspecified: Secondary | ICD-10-CM | POA: Diagnosis not present

## 2013-11-07 ENCOUNTER — Encounter: Payer: Self-pay | Admitting: Internal Medicine

## 2013-11-07 ENCOUNTER — Ambulatory Visit: Payer: Medicare Other

## 2013-11-07 ENCOUNTER — Ambulatory Visit (INDEPENDENT_AMBULATORY_CARE_PROVIDER_SITE_OTHER): Payer: Medicare Other | Admitting: Internal Medicine

## 2013-11-07 VITALS — BP 122/84 | HR 74 | Temp 97.5°F | Resp 16 | Ht 60.5 in | Wt 141.0 lb

## 2013-11-07 DIAGNOSIS — J301 Allergic rhinitis due to pollen: Secondary | ICD-10-CM | POA: Diagnosis not present

## 2013-11-07 DIAGNOSIS — J019 Acute sinusitis, unspecified: Secondary | ICD-10-CM

## 2013-11-07 MED ORDER — DOXYCYCLINE HYCLATE 100 MG PO TABS: 100.0000 mg | ORAL_TABLET | Freq: Two times a day (BID) | ORAL | Status: DC

## 2013-11-07 NOTE — Assessment & Plan Note (Signed)
I will treat the infection with doxycycline

## 2013-11-07 NOTE — Patient Instructions (Signed)

## 2013-11-07 NOTE — Progress Notes (Signed)
Subjective:    Patient ID: [name], female    DOB: 1941/03/19, 72 y.o.   MRN: [id number]  Sinusitis This is a new problem. The current episode started in the past 7 days. The problem is unchanged. There has been no fever. The fever has been present for less than 1 day. Her pain is at a severity of 0/10. She is experiencing no pain. Associated symptoms include chills, congestion, coughing (NP), sinus pressure and a sore throat. Pertinent negatives include no diaphoresis, ear pain, headaches, hoarse voice, neck pain, shortness of breath, sneezing or swollen glands. Past treatments include nothing. The treatment provided no relief.      Review of Systems  Constitutional: Positive for chills. Negative for fever, diaphoresis, activity change, appetite change, fatigue and unexpected weight change.  HENT: Positive for congestion, postnasal drip, rhinorrhea, sinus pressure and sore throat. Negative for ear pain, facial swelling, hoarse voice, nosebleeds, sneezing, tinnitus, trouble swallowing and voice change.   Eyes: Negative.   Respiratory: Positive for cough (NP). Negative for apnea, choking, chest tightness, shortness of breath, wheezing and stridor.   Cardiovascular: Negative.  Negative for chest pain, palpitations and leg swelling.  Gastrointestinal: Negative.   Endocrine: Negative.   Genitourinary: Negative.   Musculoskeletal: Negative.  Negative for neck pain.  Allergic/Immunologic: Negative.   Neurological: Negative.  Negative for dizziness and headaches.  Hematological: Negative.  Negative for adenopathy. Does not bruise/bleed easily.  Psychiatric/Behavioral: Negative.        Objective:   Physical Exam  Vitals reviewed. Constitutional: She is oriented to person, place, and time. She appears well-developed and well-nourished.  Non-toxic appearance. She does not have a sickly appearance. She does not appear ill. No distress.  HENT:  Right Ear: Hearing, tympanic membrane,  external ear and ear canal normal.  Left Ear: Hearing, tympanic membrane, external ear and ear canal normal.  Nose: Rhinorrhea present. No mucosal edema. Right sinus exhibits maxillary sinus tenderness. Right sinus exhibits no frontal sinus tenderness. Left sinus exhibits no maxillary sinus tenderness and no frontal sinus tenderness.  Mouth/Throat: Oropharynx is clear and moist and mucous membranes are normal. Mucous membranes are not pale, not dry and not cyanotic. No oral lesions. No trismus in the jaw. No uvula swelling. No oropharyngeal exudate, posterior oropharyngeal edema, posterior oropharyngeal erythema or tonsillar abscesses.  Eyes: Conjunctivae are normal. Right eye exhibits no discharge. Left eye exhibits no discharge. No scleral icterus.  Neck: Normal range of motion. Neck supple. No JVD present. No tracheal deviation present. No thyromegaly present.  Cardiovascular: Normal rate, S1 normal, S2 normal and intact distal pulses.  Exam reveals no gallop.   Murmur heard.  Decrescendo systolic murmur is present with a grade of 1/6  Pulmonary/Chest: Effort normal and breath sounds normal. No stridor. No respiratory distress. She has no wheezes. She has no rales. She exhibits no tenderness.  Abdominal: Soft. Bowel sounds are normal. She exhibits no distension and no mass. There is no tenderness. There is no rebound and no guarding.  Musculoskeletal: Normal range of motion. She exhibits no edema and no tenderness.  Lymphadenopathy:    She has no cervical adenopathy.  Neurological: She is oriented to person, place, and time.  Skin: Skin is warm and dry. No rash noted. She is not diaphoretic. No erythema. No pallor.     Lab Results  Component Value Date   WBC 5.7 12/29/2010   HGB 12.3 12/29/2010   HCT 36.6 12/29/2010   PLT 236.0 12/29/2010  GLUCOSE 90 07/24/2012   CHOL 123 01/23/2013   TRIG 51.0 01/23/2013   HDL 45.90 01/23/2013   LDLCALC 67 01/23/2013   ALT 27 01/23/2013   AST 29 01/23/2013    NA 139 07/24/2012   K 3.9 07/24/2012   CL 104 07/24/2012   CREATININE 0.7 07/24/2012   BUN 17 07/24/2012   CO2 25 07/24/2012   TSH 0.98 03/29/2012   INR 1.0 ratio 10/26/2010       Assessment & Plan:

## 2013-11-07 NOTE — Progress Notes (Signed)
Pre visit review using our clinic review tool, if applicable. No additional management support is needed unless otherwise documented below in the visit note. 

## 2013-11-07 NOTE — Assessment & Plan Note (Signed)
She is having a flare of symptoms so I gave her an injection of depo-medrol IM

## 2013-11-12 ENCOUNTER — Ambulatory Visit (INDEPENDENT_AMBULATORY_CARE_PROVIDER_SITE_OTHER): Payer: Medicare Other

## 2013-11-12 DIAGNOSIS — J309 Allergic rhinitis, unspecified: Secondary | ICD-10-CM | POA: Diagnosis not present

## 2013-11-12 DIAGNOSIS — M216X9 Other acquired deformities of unspecified foot: Secondary | ICD-10-CM | POA: Diagnosis not present

## 2013-11-12 DIAGNOSIS — S838X9A Sprain of other specified parts of unspecified knee, initial encounter: Secondary | ICD-10-CM | POA: Diagnosis not present

## 2013-11-20 ENCOUNTER — Ambulatory Visit (INDEPENDENT_AMBULATORY_CARE_PROVIDER_SITE_OTHER): Payer: Medicare Other

## 2013-11-20 DIAGNOSIS — J309 Allergic rhinitis, unspecified: Secondary | ICD-10-CM

## 2013-11-20 DIAGNOSIS — M25579 Pain in unspecified ankle and joints of unspecified foot: Secondary | ICD-10-CM | POA: Diagnosis not present

## 2013-11-27 ENCOUNTER — Ambulatory Visit (INDEPENDENT_AMBULATORY_CARE_PROVIDER_SITE_OTHER): Payer: Medicare Other

## 2013-11-27 DIAGNOSIS — J309 Allergic rhinitis, unspecified: Secondary | ICD-10-CM

## 2013-12-04 ENCOUNTER — Ambulatory Visit (INDEPENDENT_AMBULATORY_CARE_PROVIDER_SITE_OTHER): Payer: Medicare Other

## 2013-12-04 DIAGNOSIS — J309 Allergic rhinitis, unspecified: Secondary | ICD-10-CM

## 2013-12-05 ENCOUNTER — Ambulatory Visit (INDEPENDENT_AMBULATORY_CARE_PROVIDER_SITE_OTHER): Payer: Medicare Other

## 2013-12-05 DIAGNOSIS — J309 Allergic rhinitis, unspecified: Secondary | ICD-10-CM | POA: Diagnosis not present

## 2013-12-06 ENCOUNTER — Encounter: Payer: Self-pay | Admitting: Internal Medicine

## 2013-12-11 ENCOUNTER — Other Ambulatory Visit: Payer: Self-pay | Admitting: *Deleted

## 2013-12-11 MED ORDER — ZOLPIDEM TARTRATE 10 MG PO TABS: 5.0000 mg | ORAL_TABLET | Freq: Every evening | ORAL | Status: DC | PRN

## 2013-12-11 NOTE — Telephone Encounter (Signed)
[facility]...[name]

## 2013-12-12 ENCOUNTER — Other Ambulatory Visit: Payer: Self-pay | Admitting: *Deleted

## 2013-12-12 MED ORDER — ATORVASTATIN CALCIUM 20 MG PO TABS: 20.0000 mg | ORAL_TABLET | Freq: Every day | ORAL | Status: DC

## 2013-12-13 ENCOUNTER — Ambulatory Visit (INDEPENDENT_AMBULATORY_CARE_PROVIDER_SITE_OTHER): Payer: Medicare Other

## 2013-12-13 DIAGNOSIS — J309 Allergic rhinitis, unspecified: Secondary | ICD-10-CM | POA: Diagnosis not present

## 2013-12-19 ENCOUNTER — Ambulatory Visit: Payer: Medicare Other

## 2013-12-20 ENCOUNTER — Ambulatory Visit (INDEPENDENT_AMBULATORY_CARE_PROVIDER_SITE_OTHER): Payer: Medicare Other

## 2013-12-20 DIAGNOSIS — J309 Allergic rhinitis, unspecified: Secondary | ICD-10-CM

## 2013-12-25 ENCOUNTER — Ambulatory Visit (INDEPENDENT_AMBULATORY_CARE_PROVIDER_SITE_OTHER): Payer: Medicare Other

## 2013-12-25 DIAGNOSIS — J309 Allergic rhinitis, unspecified: Secondary | ICD-10-CM | POA: Diagnosis not present

## 2014-01-02 ENCOUNTER — Ambulatory Visit (INDEPENDENT_AMBULATORY_CARE_PROVIDER_SITE_OTHER): Payer: Medicare Other

## 2014-01-02 DIAGNOSIS — J309 Allergic rhinitis, unspecified: Secondary | ICD-10-CM | POA: Diagnosis not present

## 2014-01-09 ENCOUNTER — Ambulatory Visit (INDEPENDENT_AMBULATORY_CARE_PROVIDER_SITE_OTHER): Payer: Medicare Other

## 2014-01-09 DIAGNOSIS — J309 Allergic rhinitis, unspecified: Secondary | ICD-10-CM | POA: Diagnosis not present

## 2014-01-14 ENCOUNTER — Ambulatory Visit (INDEPENDENT_AMBULATORY_CARE_PROVIDER_SITE_OTHER): Payer: Medicare Other

## 2014-01-14 DIAGNOSIS — J309 Allergic rhinitis, unspecified: Secondary | ICD-10-CM

## 2014-01-16 DIAGNOSIS — Z01419 Encounter for gynecological examination (general) (routine) without abnormal findings: Secondary | ICD-10-CM | POA: Diagnosis not present

## 2014-01-16 DIAGNOSIS — Z124 Encounter for screening for malignant neoplasm of cervix: Secondary | ICD-10-CM | POA: Diagnosis not present

## 2014-01-21 DIAGNOSIS — H04129 Dry eye syndrome of unspecified lacrimal gland: Secondary | ICD-10-CM | POA: Diagnosis not present

## 2014-01-21 DIAGNOSIS — H52209 Unspecified astigmatism, unspecified eye: Secondary | ICD-10-CM | POA: Diagnosis not present

## 2014-01-21 DIAGNOSIS — H2510 Age-related nuclear cataract, unspecified eye: Secondary | ICD-10-CM | POA: Diagnosis not present

## 2014-01-21 DIAGNOSIS — H5200 Hypermetropia, unspecified eye: Secondary | ICD-10-CM | POA: Diagnosis not present

## 2014-01-23 ENCOUNTER — Ambulatory Visit (INDEPENDENT_AMBULATORY_CARE_PROVIDER_SITE_OTHER): Payer: Medicare Other

## 2014-01-23 DIAGNOSIS — J309 Allergic rhinitis, unspecified: Secondary | ICD-10-CM | POA: Diagnosis not present

## 2014-01-25 DIAGNOSIS — M25579 Pain in unspecified ankle and joints of unspecified foot: Secondary | ICD-10-CM | POA: Diagnosis not present

## 2014-01-25 DIAGNOSIS — M6281 Muscle weakness (generalized): Secondary | ICD-10-CM | POA: Diagnosis not present

## 2014-01-28 ENCOUNTER — Other Ambulatory Visit: Payer: Self-pay | Admitting: Cardiovascular Disease

## 2014-01-28 ENCOUNTER — Other Ambulatory Visit: Payer: Self-pay | Admitting: Internal Medicine

## 2014-01-28 ENCOUNTER — Ambulatory Visit (INDEPENDENT_AMBULATORY_CARE_PROVIDER_SITE_OTHER): Payer: Medicare Other

## 2014-01-28 DIAGNOSIS — J309 Allergic rhinitis, unspecified: Secondary | ICD-10-CM

## 2014-01-28 DIAGNOSIS — M25579 Pain in unspecified ankle and joints of unspecified foot: Secondary | ICD-10-CM | POA: Diagnosis not present

## 2014-01-28 DIAGNOSIS — M6281 Muscle weakness (generalized): Secondary | ICD-10-CM | POA: Diagnosis not present

## 2014-01-30 ENCOUNTER — Ambulatory Visit: Payer: Medicare Other

## 2014-01-30 DIAGNOSIS — T8549XA Other mechanical complication of breast prosthesis and implant, initial encounter: Secondary | ICD-10-CM | POA: Diagnosis not present

## 2014-01-30 DIAGNOSIS — M25579 Pain in unspecified ankle and joints of unspecified foot: Secondary | ICD-10-CM | POA: Diagnosis not present

## 2014-01-30 DIAGNOSIS — Z853 Personal history of malignant neoplasm of breast: Secondary | ICD-10-CM | POA: Diagnosis not present

## 2014-01-30 DIAGNOSIS — Z9010 Acquired absence of unspecified breast and nipple: Secondary | ICD-10-CM | POA: Diagnosis not present

## 2014-01-30 DIAGNOSIS — M6281 Muscle weakness (generalized): Secondary | ICD-10-CM | POA: Diagnosis not present

## 2014-01-31 ENCOUNTER — Ambulatory Visit (INDEPENDENT_AMBULATORY_CARE_PROVIDER_SITE_OTHER): Payer: Medicare Other

## 2014-01-31 DIAGNOSIS — J309 Allergic rhinitis, unspecified: Secondary | ICD-10-CM

## 2014-02-04 ENCOUNTER — Ambulatory Visit (INDEPENDENT_AMBULATORY_CARE_PROVIDER_SITE_OTHER): Payer: Medicare Other

## 2014-02-04 DIAGNOSIS — J309 Allergic rhinitis, unspecified: Secondary | ICD-10-CM

## 2014-02-04 DIAGNOSIS — M6281 Muscle weakness (generalized): Secondary | ICD-10-CM | POA: Diagnosis not present

## 2014-02-04 DIAGNOSIS — M25579 Pain in unspecified ankle and joints of unspecified foot: Secondary | ICD-10-CM | POA: Diagnosis not present

## 2014-02-05 ENCOUNTER — Other Ambulatory Visit (INDEPENDENT_AMBULATORY_CARE_PROVIDER_SITE_OTHER): Payer: Medicare Other

## 2014-02-05 DIAGNOSIS — I359 Nonrheumatic aortic valve disorder, unspecified: Secondary | ICD-10-CM | POA: Diagnosis not present

## 2014-02-05 DIAGNOSIS — I2510 Atherosclerotic heart disease of native coronary artery without angina pectoris: Secondary | ICD-10-CM

## 2014-02-05 LAB — LIPID PANEL
Cholesterol: 123 mg/dL (ref 0–200)
HDL: 49.5 mg/dL (ref >39.00)
LDL CALC: 67 mg/dL (ref 0–99)
TRIGLYCERIDES: 32 mg/dL (ref 0.0–149.0)
Total CHOL/HDL Ratio: 2
VLDL: 6.4 mg/dL (ref 0.0–40.0)

## 2014-02-05 LAB — HEPATIC FUNCTION PANEL
ALBUMIN: 3.7 g/dL (ref 3.5–5.2)
ALT: 32 U/L (ref 0–35)
AST: 37 U/L (ref 0–37)
Alkaline Phosphatase: 67 U/L (ref 39–117)
Bilirubin, Direct: 0.1 mg/dL (ref 0.0–0.3)
TOTAL PROTEIN: 6.2 g/dL (ref 6.0–8.3)
Total Bilirubin: 0.9 mg/dL (ref 0.3–1.2)

## 2014-02-06 DIAGNOSIS — M25579 Pain in unspecified ankle and joints of unspecified foot: Secondary | ICD-10-CM | POA: Diagnosis not present

## 2014-02-06 DIAGNOSIS — M6281 Muscle weakness (generalized): Secondary | ICD-10-CM | POA: Diagnosis not present

## 2014-02-08 ENCOUNTER — Ambulatory Visit (INDEPENDENT_AMBULATORY_CARE_PROVIDER_SITE_OTHER): Payer: Medicare Other | Admitting: Cardiovascular Disease

## 2014-02-08 ENCOUNTER — Encounter: Payer: Self-pay | Admitting: Cardiovascular Disease

## 2014-02-08 VITALS — BP 146/108 | HR 67 | Ht 60.5 in | Wt 135.0 lb

## 2014-02-08 DIAGNOSIS — I2510 Atherosclerotic heart disease of native coronary artery without angina pectoris: Secondary | ICD-10-CM | POA: Diagnosis not present

## 2014-02-08 DIAGNOSIS — I359 Nonrheumatic aortic valve disorder, unspecified: Secondary | ICD-10-CM

## 2014-02-08 DIAGNOSIS — I6529 Occlusion and stenosis of unspecified carotid artery: Secondary | ICD-10-CM

## 2014-02-08 DIAGNOSIS — E785 Hyperlipidemia, unspecified: Secondary | ICD-10-CM | POA: Diagnosis not present

## 2014-02-08 NOTE — Patient Instructions (Addendum)
Your physician recommends that you continue on your current medications as directed. Please refer to the Current Medication list given to you today.  Your physician wants you to follow-up in: 6 months with Dr. Burt Knack or one of his PAs or Nurse Practioners.  You will receive a reminder letter in the mail two months in advance. If you don't receive a letter, please call our office to schedule the follow-up appointment.  Your physician has requested that you regularly monitor and record your blood pressure readings at home. Report for BP > 140/90

## 2014-02-08 NOTE — Progress Notes (Signed)
HPI:  73 year old woman presenting for followup evaluation. The patient has coronary artery disease with history of CABG in 2007. She underwent cardiac catheterization in 2011 demonstrating patency of all of her bypass grafts and preserved LV function.   Overall she is doing well from a cardiac perspective. Denies chest pain, shortness of breath, or edema. She had a remote left mastectomy and is planning on having surgery because of a problem with the implant. She's been limited by plantar fasciitis and hasn't been doing as much exercise. She has chronic palpitations most bothersome at bedtime. Otherwise no specific complaints.  Recent lipids: Lipid Panel     Component Value Date/Time   CHOL 123 02/05/2014 1010   TRIG 32.0 02/05/2014 1010   HDL 49.50 02/05/2014 1010   CHOLHDL 2 02/05/2014 1010   VLDL 6.4 02/05/2014 1010   LDLCALC 67 02/05/2014 1010     Outpatient Encounter Prescriptions as of 02/08/2014  Medication Sig  . aspirin 81 MG tablet Take 81 mg by mouth daily.    Marland Kitchen atorvastatin (LIPITOR) 20 MG tablet Take 1 tablet (20 mg total) by mouth daily.  . Calcium Carbonate (CALTRATE 600 PO) Take 2 tablets by mouth daily.    . chlorpheniramine (CHLOR-TRIMETON) 4 MG tablet Take 4 mg by mouth 2 (two) times daily as needed.  . Coenzyme Q10 (CO Q-10 PO) Take 50 mg by mouth daily.    Marland Kitchen levothyroxine (SYNTHROID, LEVOTHROID) 75 MCG tablet TAKE 1 TABLET DAILY  . meloxicam (MOBIC) 15 MG tablet Take 1 tablet (15 mg total) by mouth daily. TAke 1/4 tablet or as directed  . metoprolol tartrate (LOPRESSOR) 25 MG tablet TAKE 1 TABLET TWICE A DAY  . multivitamin (THERAGRAN) per tablet Take 1 tablet by mouth daily.    . pantoprazole (PROTONIX) 40 MG tablet Take 1 tablet (40 mg total) by mouth daily.  Marland Kitchen senna-docusate (SENOKOT-S) 8.6-50 MG per tablet Take 1 tablet by mouth daily.    Marland Kitchen zolpidem (AMBIEN) 10 MG tablet Take 0.5 tablets (5 mg total) by mouth at bedtime as needed.  . [DISCONTINUED] doxycycline  (VIBRA-TABS) 100 MG tablet Take 1 tablet (100 mg total) by mouth 2 (two) times daily.  . [DISCONTINUED] zoster vaccine live, PF, (ZOSTAVAX) 32440 UNT/0.65ML injection Inject 19,400 Units into the skin once.    Allergies  Allergen Reactions  . Clarithromycin   . Codeine     REACTION: nausea  . Levofloxacin     REACTION: nausea  . Morphine   . Naproxen     REACTION: nausea  . Penicillins     REACTION: RASH    Past Medical History  Diagnosis Date  . Hypertension   . COPD (chronic obstructive pulmonary disease)   . Osteopenia   . Aortic stenosis, moderate   . History of DVT (deep vein thrombosis)   . Coronary artery disease     s/p CABGx5 2007  . ALLERGIC RHINITIS   . ASTHMA   . BREAST CANCER     left, 1991; B mastectomy  . CORONARY ARTERY DISEASE   . DIVERTICULOSIS   . DVT   . EROSIVE GASTRITIS   . GERD   . HYPERLIPIDEMIA   . HYPOTHYROIDISM   . Occlusion and stenosis of carotid artery without mention of cerebral infarction   . OSTEOPOROSIS   . RHINOSINUSITIS, RECURRENT     ROS: Negative except as per HPI  BP 146/108  Pulse 67  Ht 5' 0.5" (1.537 m)  Wt 135 lb (61.236 kg)  BMI 25.92 kg/m2  PHYSICAL EXAM: Pt is alert and oriented, NAD HEENT: normal Neck: JVP - normal, carotids 2+= with a soft right carotid bruit Lungs: CTA bilaterally CV: RRR without murmur or gallop Abd: soft, NT, Positive BS, no hepatomegaly Ext: no C/C/E, distal pulses intact and equal Skin: warm/dry no rash  EKG:  NSR 67 bpm, cannot rule out age-indeterminate anterior infarct. No significant ST-T changes.  ASSESSMENT AND PLAN: 1. CAD S/P CABG. No anginal sx's. Medications reviewed and will continue same regimen.  2. Hyperlipidemia: lipids at goal. On statin drug.   3. Elevated BP: on my recheck her BP is 150/70. I asked her to monitor at home and call if readings greater than 140/90.  4. Preop: she is post-CABG with no anginal sx's. At low risk of cardiac complications related to  breast surgery.   [name] 02/08/2014 3:51 PM

## 2014-02-11 ENCOUNTER — Ambulatory Visit (INDEPENDENT_AMBULATORY_CARE_PROVIDER_SITE_OTHER): Payer: Medicare Other

## 2014-02-11 DIAGNOSIS — J309 Allergic rhinitis, unspecified: Secondary | ICD-10-CM | POA: Diagnosis not present

## 2014-02-11 DIAGNOSIS — S838X9A Sprain of other specified parts of unspecified knee, initial encounter: Secondary | ICD-10-CM | POA: Diagnosis not present

## 2014-02-11 DIAGNOSIS — S86819A Strain of other muscle(s) and tendon(s) at lower leg level, unspecified leg, initial encounter: Secondary | ICD-10-CM | POA: Diagnosis not present

## 2014-02-12 DIAGNOSIS — M6281 Muscle weakness (generalized): Secondary | ICD-10-CM | POA: Diagnosis not present

## 2014-02-12 DIAGNOSIS — M25579 Pain in unspecified ankle and joints of unspecified foot: Secondary | ICD-10-CM | POA: Diagnosis not present

## 2014-02-13 HISTORY — PX: REVISION RECONSTRUCTED BREAST: SUR1278

## 2014-02-14 DIAGNOSIS — M6281 Muscle weakness (generalized): Secondary | ICD-10-CM | POA: Diagnosis not present

## 2014-02-14 DIAGNOSIS — M25579 Pain in unspecified ankle and joints of unspecified foot: Secondary | ICD-10-CM | POA: Diagnosis not present

## 2014-02-19 ENCOUNTER — Ambulatory Visit (INDEPENDENT_AMBULATORY_CARE_PROVIDER_SITE_OTHER): Payer: Medicare Other

## 2014-02-19 DIAGNOSIS — M25579 Pain in unspecified ankle and joints of unspecified foot: Secondary | ICD-10-CM | POA: Diagnosis not present

## 2014-02-19 DIAGNOSIS — M6281 Muscle weakness (generalized): Secondary | ICD-10-CM | POA: Diagnosis not present

## 2014-02-19 DIAGNOSIS — J309 Allergic rhinitis, unspecified: Secondary | ICD-10-CM

## 2014-02-21 DIAGNOSIS — M25579 Pain in unspecified ankle and joints of unspecified foot: Secondary | ICD-10-CM | POA: Diagnosis not present

## 2014-02-21 DIAGNOSIS — M6281 Muscle weakness (generalized): Secondary | ICD-10-CM | POA: Diagnosis not present

## 2014-02-25 ENCOUNTER — Ambulatory Visit (INDEPENDENT_AMBULATORY_CARE_PROVIDER_SITE_OTHER): Payer: Medicare Other

## 2014-02-25 DIAGNOSIS — M25579 Pain in unspecified ankle and joints of unspecified foot: Secondary | ICD-10-CM | POA: Diagnosis not present

## 2014-02-25 DIAGNOSIS — J309 Allergic rhinitis, unspecified: Secondary | ICD-10-CM

## 2014-02-25 DIAGNOSIS — M6281 Muscle weakness (generalized): Secondary | ICD-10-CM | POA: Diagnosis not present

## 2014-02-27 DIAGNOSIS — M25579 Pain in unspecified ankle and joints of unspecified foot: Secondary | ICD-10-CM | POA: Diagnosis not present

## 2014-02-27 DIAGNOSIS — M6281 Muscle weakness (generalized): Secondary | ICD-10-CM | POA: Diagnosis not present

## 2014-02-28 DIAGNOSIS — Z9010 Acquired absence of unspecified breast and nipple: Secondary | ICD-10-CM | POA: Diagnosis not present

## 2014-02-28 DIAGNOSIS — Z853 Personal history of malignant neoplasm of breast: Secondary | ICD-10-CM | POA: Diagnosis not present

## 2014-02-28 DIAGNOSIS — T8549XA Other mechanical complication of breast prosthesis and implant, initial encounter: Secondary | ICD-10-CM | POA: Diagnosis not present

## 2014-03-04 DIAGNOSIS — M6281 Muscle weakness (generalized): Secondary | ICD-10-CM | POA: Diagnosis not present

## 2014-03-04 DIAGNOSIS — M25579 Pain in unspecified ankle and joints of unspecified foot: Secondary | ICD-10-CM | POA: Diagnosis not present

## 2014-03-05 ENCOUNTER — Ambulatory Visit (INDEPENDENT_AMBULATORY_CARE_PROVIDER_SITE_OTHER): Payer: Medicare Other

## 2014-03-05 DIAGNOSIS — E785 Hyperlipidemia, unspecified: Secondary | ICD-10-CM | POA: Diagnosis not present

## 2014-03-05 DIAGNOSIS — Z7982 Long term (current) use of aspirin: Secondary | ICD-10-CM | POA: Diagnosis not present

## 2014-03-05 DIAGNOSIS — T8544XA Capsular contracture of breast implant, initial encounter: Secondary | ICD-10-CM | POA: Diagnosis not present

## 2014-03-05 DIAGNOSIS — Z886 Allergy status to analgesic agent status: Secondary | ICD-10-CM | POA: Diagnosis not present

## 2014-03-05 DIAGNOSIS — J309 Allergic rhinitis, unspecified: Secondary | ICD-10-CM | POA: Diagnosis not present

## 2014-03-05 DIAGNOSIS — E079 Disorder of thyroid, unspecified: Secondary | ICD-10-CM | POA: Diagnosis not present

## 2014-03-05 DIAGNOSIS — T8549XA Other mechanical complication of breast prosthesis and implant, initial encounter: Secondary | ICD-10-CM | POA: Diagnosis not present

## 2014-03-05 DIAGNOSIS — Z9889 Other specified postprocedural states: Secondary | ICD-10-CM | POA: Diagnosis not present

## 2014-03-05 DIAGNOSIS — Z9010 Acquired absence of unspecified breast and nipple: Secondary | ICD-10-CM | POA: Diagnosis not present

## 2014-03-05 DIAGNOSIS — T85898A Other specified complication of other internal prosthetic devices, implants and grafts, initial encounter: Secondary | ICD-10-CM | POA: Diagnosis not present

## 2014-03-05 DIAGNOSIS — D649 Anemia, unspecified: Secondary | ICD-10-CM | POA: Diagnosis not present

## 2014-03-05 DIAGNOSIS — Z01818 Encounter for other preprocedural examination: Secondary | ICD-10-CM | POA: Diagnosis not present

## 2014-03-05 DIAGNOSIS — Z880 Allergy status to penicillin: Secondary | ICD-10-CM | POA: Diagnosis not present

## 2014-03-05 DIAGNOSIS — IMO0002 Reserved for concepts with insufficient information to code with codable children: Secondary | ICD-10-CM | POA: Diagnosis not present

## 2014-03-05 DIAGNOSIS — Z79899 Other long term (current) drug therapy: Secondary | ICD-10-CM | POA: Diagnosis not present

## 2014-03-05 DIAGNOSIS — Z853 Personal history of malignant neoplasm of breast: Secondary | ICD-10-CM | POA: Diagnosis not present

## 2014-03-05 DIAGNOSIS — I10 Essential (primary) hypertension: Secondary | ICD-10-CM | POA: Diagnosis not present

## 2014-03-06 DIAGNOSIS — M6281 Muscle weakness (generalized): Secondary | ICD-10-CM | POA: Diagnosis not present

## 2014-03-06 DIAGNOSIS — M25579 Pain in unspecified ankle and joints of unspecified foot: Secondary | ICD-10-CM | POA: Diagnosis not present

## 2014-03-07 DIAGNOSIS — T85898A Other specified complication of other internal prosthetic devices, implants and grafts, initial encounter: Secondary | ICD-10-CM | POA: Diagnosis not present

## 2014-03-07 DIAGNOSIS — T8544XA Capsular contracture of breast implant, initial encounter: Secondary | ICD-10-CM | POA: Diagnosis not present

## 2014-03-07 DIAGNOSIS — I10 Essential (primary) hypertension: Secondary | ICD-10-CM | POA: Diagnosis not present

## 2014-03-07 DIAGNOSIS — T8549XA Other mechanical complication of breast prosthesis and implant, initial encounter: Secondary | ICD-10-CM | POA: Diagnosis not present

## 2014-03-07 DIAGNOSIS — Z9010 Acquired absence of unspecified breast and nipple: Secondary | ICD-10-CM | POA: Diagnosis not present

## 2014-03-07 DIAGNOSIS — Z853 Personal history of malignant neoplasm of breast: Secondary | ICD-10-CM | POA: Diagnosis not present

## 2014-03-13 ENCOUNTER — Ambulatory Visit (INDEPENDENT_AMBULATORY_CARE_PROVIDER_SITE_OTHER): Payer: Medicare Other

## 2014-03-13 DIAGNOSIS — J309 Allergic rhinitis, unspecified: Secondary | ICD-10-CM

## 2014-03-14 DIAGNOSIS — M6281 Muscle weakness (generalized): Secondary | ICD-10-CM | POA: Diagnosis not present

## 2014-03-14 DIAGNOSIS — M25579 Pain in unspecified ankle and joints of unspecified foot: Secondary | ICD-10-CM | POA: Diagnosis not present

## 2014-03-19 DIAGNOSIS — M25579 Pain in unspecified ankle and joints of unspecified foot: Secondary | ICD-10-CM | POA: Diagnosis not present

## 2014-03-19 DIAGNOSIS — M6281 Muscle weakness (generalized): Secondary | ICD-10-CM | POA: Diagnosis not present

## 2014-03-20 ENCOUNTER — Ambulatory Visit (INDEPENDENT_AMBULATORY_CARE_PROVIDER_SITE_OTHER): Payer: Medicare Other

## 2014-03-20 DIAGNOSIS — J309 Allergic rhinitis, unspecified: Secondary | ICD-10-CM

## 2014-03-25 ENCOUNTER — Ambulatory Visit (INDEPENDENT_AMBULATORY_CARE_PROVIDER_SITE_OTHER): Payer: Medicare Other

## 2014-03-25 DIAGNOSIS — J309 Allergic rhinitis, unspecified: Secondary | ICD-10-CM | POA: Diagnosis not present

## 2014-03-25 DIAGNOSIS — Z5189 Encounter for other specified aftercare: Secondary | ICD-10-CM | POA: Diagnosis not present

## 2014-03-28 ENCOUNTER — Other Ambulatory Visit: Payer: Self-pay

## 2014-03-28 MED ORDER — ATORVASTATIN CALCIUM 20 MG PO TABS: 20.0000 mg | ORAL_TABLET | Freq: Every day | ORAL | Status: DC

## 2014-04-01 ENCOUNTER — Ambulatory Visit: Payer: Medicare Other

## 2014-04-03 ENCOUNTER — Encounter: Payer: Self-pay | Admitting: Sports Medicine

## 2014-04-03 ENCOUNTER — Ambulatory Visit (INDEPENDENT_AMBULATORY_CARE_PROVIDER_SITE_OTHER): Payer: Medicare Other

## 2014-04-03 ENCOUNTER — Ambulatory Visit (INDEPENDENT_AMBULATORY_CARE_PROVIDER_SITE_OTHER): Payer: Medicare Other | Admitting: Sports Medicine

## 2014-04-03 VITALS — BP 143/77 | Ht 63.0 in | Wt 131.0 lb

## 2014-04-03 DIAGNOSIS — M24876 Other specific joint derangements of unspecified foot, not elsewhere classified: Secondary | ICD-10-CM | POA: Diagnosis not present

## 2014-04-03 DIAGNOSIS — M24873 Other specific joint derangements of unspecified ankle, not elsewhere classified: Secondary | ICD-10-CM

## 2014-04-03 DIAGNOSIS — M79671 Pain in right foot: Secondary | ICD-10-CM

## 2014-04-03 DIAGNOSIS — J309 Allergic rhinitis, unspecified: Secondary | ICD-10-CM

## 2014-04-03 DIAGNOSIS — M216X9 Other acquired deformities of unspecified foot: Secondary | ICD-10-CM

## 2014-04-03 DIAGNOSIS — M25374 Other instability, right foot: Secondary | ICD-10-CM

## 2014-04-03 DIAGNOSIS — M79609 Pain in unspecified limb: Secondary | ICD-10-CM

## 2014-04-03 DIAGNOSIS — I2510 Atherosclerotic heart disease of native coronary artery without angina pectoris: Secondary | ICD-10-CM | POA: Diagnosis not present

## 2014-04-03 NOTE — Progress Notes (Signed)
CC: Right foot pain HPI: [name] is a pleasant 73 year old female who presents upon referral from Noel Gerold, PT, for evaluation of right foot and lateral ankle pain and consideration of custom orthotics. Patient states she is having a lot of difficulty with her right foot. It became a real issue for her about 5 years ago after she had foot surgery. Based on her surgical scar it appears this may have been of peroneal tendon repair. She states that her foot never felt right after that. She has had severe pain in the foot that unfortunately has not been corrected by custom orthotics x2, or physical therapy. She saw Dr. Doran Durand but he recommended against surgery as he was not certain that he can actually give her adequate pain relief. She has had an MRI of her right foot that did show abnormal alignment of the calcaneus as well as subluxation of the talus. There is edema within the lateral calcaneus as well as over the lateral talar prominence. This is greatly affecting her life and she is having difficulty walking and doing her activities of daily living.  ROS: As above in the HPI. All other systems are stable or negative.  OBJECTIVE: APPEARANCE:  Patient in no acute distress.The patient appeared well nourished and normally developed. HEENT: No scleral icterus. Conjunctiva non-injected Resp: Non labored Skin: No rash MSK:  Right foot: - Severe equinocavus foot bilaterally - Severe supination right greater than left - There is obvious prominence on inspection over the lateral foot and ankle which is very focally tender to palpation and corresponds to the spot of the lateral talar prominence on her MRI where there is both bony and subcutaneous edema  MSK [place]: Not performed   ASSESSMENT: #1. Right foot and ankle pain secondary to severe equinocavus foot deformity and supination with resultant abnormality the subtalar joint and talar subluxation causing pressure and pain over the lateral talar  prominence with bony and subcutaneous edema  PLAN: We discussed with the patient that she unfortunately has a very challenging problem. Her anatomy is such that this will be very difficult to fully correct. Her hope is to help her have less pain although we do not think we will able to fully relieve her symptoms. We do feel that custom orthotics will be beneficial for her and she was fitted with these today. She did find that they made things more comfortable. We laterally wedged her orthotic of the right foot to help take some pressure off of the lateral right foot. We will see her back as needed.  Patient was fitted for a : standard, cushioned, semi-rigid orthotic. The orthotic was heated and afterward the patient stood on the orthotic blank positioned on the orthotic stand. The patient was positioned in subtalar neutral position and 10 degrees of ankle dorsiflexion in a weight bearing stance. After completion of molding, a stable base was applied to the orthotic blank. The blank was ground to a stable position for weight bearing. Size: 6  Base: EVA Posting: right foot laterally wedged in EVA base Additional orthotic padding:  none  Total time spent > 60 minutes for evaluation of this very complicated patient, review of MRI, and creation of custom orthotics.

## 2014-04-10 ENCOUNTER — Other Ambulatory Visit: Payer: Self-pay | Admitting: Dermatology

## 2014-04-10 ENCOUNTER — Ambulatory Visit (INDEPENDENT_AMBULATORY_CARE_PROVIDER_SITE_OTHER): Payer: Medicare Other

## 2014-04-10 ENCOUNTER — Telehealth: Payer: Self-pay | Admitting: Internal Medicine

## 2014-04-10 DIAGNOSIS — D485 Neoplasm of uncertain behavior of skin: Secondary | ICD-10-CM | POA: Diagnosis not present

## 2014-04-10 DIAGNOSIS — Z85828 Personal history of other malignant neoplasm of skin: Secondary | ICD-10-CM | POA: Diagnosis not present

## 2014-04-10 DIAGNOSIS — L570 Actinic keratosis: Secondary | ICD-10-CM | POA: Diagnosis not present

## 2014-04-10 DIAGNOSIS — J309 Allergic rhinitis, unspecified: Secondary | ICD-10-CM | POA: Diagnosis not present

## 2014-04-10 DIAGNOSIS — C44621 Squamous cell carcinoma of skin of unspecified upper limb, including shoulder: Secondary | ICD-10-CM | POA: Diagnosis not present

## 2014-04-10 MED ORDER — LEVOTHYROXINE SODIUM 75 MCG PO TABS: ORAL_TABLET | ORAL | Status: DC

## 2014-04-10 NOTE — Telephone Encounter (Signed)
Called pt to clarify med & pharmacy. Pt needing her [facility]othyroxine [facility] Inform pt will send...[name]

## 2014-04-10 NOTE — Telephone Encounter (Signed)
Patient needs rx refill of thyroxine.  She is requesting a call back.  Thanks!

## 2014-04-16 ENCOUNTER — Ambulatory Visit (INDEPENDENT_AMBULATORY_CARE_PROVIDER_SITE_OTHER): Payer: Medicare Other

## 2014-04-16 ENCOUNTER — Encounter: Payer: Self-pay | Admitting: Internal Medicine

## 2014-04-16 DIAGNOSIS — J309 Allergic rhinitis, unspecified: Secondary | ICD-10-CM

## 2014-04-23 ENCOUNTER — Ambulatory Visit (INDEPENDENT_AMBULATORY_CARE_PROVIDER_SITE_OTHER): Payer: Medicare Other

## 2014-04-23 DIAGNOSIS — J309 Allergic rhinitis, unspecified: Secondary | ICD-10-CM | POA: Diagnosis not present

## 2014-04-28 ENCOUNTER — Other Ambulatory Visit: Payer: Self-pay | Admitting: Cardiovascular Disease

## 2014-05-02 ENCOUNTER — Ambulatory Visit (INDEPENDENT_AMBULATORY_CARE_PROVIDER_SITE_OTHER): Payer: Medicare Other

## 2014-05-02 DIAGNOSIS — J309 Allergic rhinitis, unspecified: Secondary | ICD-10-CM | POA: Diagnosis not present

## 2014-05-08 ENCOUNTER — Ambulatory Visit (INDEPENDENT_AMBULATORY_CARE_PROVIDER_SITE_OTHER): Payer: Medicare Other

## 2014-05-08 DIAGNOSIS — J309 Allergic rhinitis, unspecified: Secondary | ICD-10-CM

## 2014-05-15 ENCOUNTER — Ambulatory Visit (INDEPENDENT_AMBULATORY_CARE_PROVIDER_SITE_OTHER): Payer: Medicare Other

## 2014-05-15 DIAGNOSIS — J309 Allergic rhinitis, unspecified: Secondary | ICD-10-CM | POA: Diagnosis not present

## 2014-05-16 ENCOUNTER — Telehealth: Payer: Self-pay | Admitting: Internal Medicine

## 2014-05-16 NOTE — Telephone Encounter (Signed)
Spoke with patient-she has been scheduled with CY on Wednesday 06-05-14 at 11:15am. Pt will get her allergy injection the same day.

## 2014-05-22 ENCOUNTER — Encounter: Payer: Medicare Other | Admitting: Internal Medicine

## 2014-05-22 ENCOUNTER — Ambulatory Visit (INDEPENDENT_AMBULATORY_CARE_PROVIDER_SITE_OTHER): Payer: Medicare Other

## 2014-05-22 DIAGNOSIS — J309 Allergic rhinitis, unspecified: Secondary | ICD-10-CM | POA: Diagnosis not present

## 2014-05-29 ENCOUNTER — Ambulatory Visit (INDEPENDENT_AMBULATORY_CARE_PROVIDER_SITE_OTHER): Payer: Medicare Other

## 2014-05-29 DIAGNOSIS — J309 Allergic rhinitis, unspecified: Secondary | ICD-10-CM | POA: Diagnosis not present

## 2014-05-30 ENCOUNTER — Encounter: Payer: Self-pay | Admitting: Internal Medicine

## 2014-05-30 ENCOUNTER — Other Ambulatory Visit (INDEPENDENT_AMBULATORY_CARE_PROVIDER_SITE_OTHER): Payer: Medicare Other

## 2014-05-30 ENCOUNTER — Ambulatory Visit (INDEPENDENT_AMBULATORY_CARE_PROVIDER_SITE_OTHER): Payer: Medicare Other | Admitting: Internal Medicine

## 2014-05-30 VITALS — BP 130/80 | HR 55 | Temp 98.2°F | Ht 65.5 in | Wt 133.4 lb

## 2014-05-30 DIAGNOSIS — E785 Hyperlipidemia, unspecified: Secondary | ICD-10-CM | POA: Diagnosis not present

## 2014-05-30 DIAGNOSIS — R5381 Other malaise: Secondary | ICD-10-CM

## 2014-05-30 DIAGNOSIS — Z0000 Encounter for general adult medical examination without abnormal findings: Secondary | ICD-10-CM

## 2014-05-30 DIAGNOSIS — M810 Age-related osteoporosis without current pathological fracture: Secondary | ICD-10-CM

## 2014-05-30 DIAGNOSIS — Z23 Encounter for immunization: Secondary | ICD-10-CM

## 2014-05-30 DIAGNOSIS — R5383 Other fatigue: Secondary | ICD-10-CM

## 2014-05-30 DIAGNOSIS — E039 Hypothyroidism, unspecified: Secondary | ICD-10-CM

## 2014-05-30 DIAGNOSIS — I2510 Atherosclerotic heart disease of native coronary artery without angina pectoris: Secondary | ICD-10-CM

## 2014-05-30 LAB — BASIC METABOLIC PANEL
BUN: 19 mg/dL (ref 6–23)
CHLORIDE: 106 meq/L (ref 96–112)
CO2: 31 mEq/L (ref 19–32)
Calcium: 9.7 mg/dL (ref 8.4–10.5)
Creatinine, Ser: 0.7 mg/dL (ref 0.4–1.2)
GFR: 91.11 mL/min (ref >60.00)
Glucose, Bld: 93 mg/dL (ref 70–99)
POTASSIUM: 4.3 meq/L (ref 3.5–5.1)
SODIUM: 139 meq/L (ref 135–145)

## 2014-05-30 LAB — CBC WITH DIFFERENTIAL/PLATELET
BASOS PCT: 0.4 % (ref 0.0–3.0)
Basophils Absolute: 0 10*3/uL (ref 0.0–0.1)
EOS PCT: 4.4 % (ref 0.0–5.0)
Eosinophils Absolute: 0.3 10*3/uL (ref 0.0–0.7)
HEMATOCRIT: 42.6 % (ref 36.0–46.0)
Hemoglobin: 14.1 g/dL (ref 12.0–15.0)
Lymphocytes Relative: 36.6 % (ref 12.0–46.0)
Lymphs Abs: 2.3 10*3/uL (ref 0.7–4.0)
MCHC: 33 g/dL (ref 30.0–36.0)
MCV: 89.3 fl (ref 78.0–100.0)
Monocytes Absolute: 0.6 10*3/uL (ref 0.1–1.0)
Monocytes Relative: 9.1 % (ref 3.0–12.0)
NEUTROS PCT: 49.5 % (ref 43.0–77.0)
Neutro Abs: 3.1 10*3/uL (ref 1.4–7.7)
Platelets: 276 10*3/uL (ref 150.0–400.0)
RBC: 4.77 Mil/uL (ref 3.87–5.11)
RDW: 14.1 % (ref 11.5–15.5)
WBC: 6.2 10*3/uL (ref 4.0–10.5)

## 2014-05-30 LAB — HEPATIC FUNCTION PANEL
ALBUMIN: 4.1 g/dL (ref 3.5–5.2)
ALT: 25 U/L (ref 0–35)
AST: 29 U/L (ref 0–37)
Alkaline Phosphatase: 71 U/L (ref 39–117)
BILIRUBIN DIRECT: 0.2 mg/dL (ref 0.0–0.3)
TOTAL PROTEIN: 6.9 g/dL (ref 6.0–8.3)
Total Bilirubin: 1 mg/dL (ref 0.2–1.2)

## 2014-05-30 LAB — VITAMIN D 25 HYDROXY (VIT D DEFICIENCY, FRACTURES): VITD: 62.34 ng/mL

## 2014-05-30 LAB — TSH: TSH: 1.94 u[IU]/mL (ref 0.35–4.50)

## 2014-05-30 LAB — VITAMIN B12: Vitamin B-12: 340 pg/mL (ref 211–911)

## 2014-05-30 NOTE — Assessment & Plan Note (Signed)
Hx fracture - ankle and foot with accidental trauma (remote) DEXA 03/2013: -2.7 On fosamax for years, stopped in 2005 On Ca 1200mg  + WB exercised Check Vit D now

## 2014-05-30 NOTE — Patient Instructions (Addendum)
It was good to see you today.  We have reviewed your prior records including labs and tests today  Health Maintenance reviewed - tetanus immunization updated today - all other recommended immunizations and age-appropriate screenings are up-to-date.  Test(s) ordered today. Your results will be released to [place] (or called to you) after review, usually within 72hours after test completion. If any changes need to be made, you will be notified at that same time.  Medications reviewed and updated  Please schedule followup in 12 months for annual exam and labs, call sooner if problems.  Health Maintenance, Female A healthy lifestyle and preventative care can promote health and wellness.  Maintain regular health, dental, and eye exams.  Eat a healthy diet. Foods like vegetables, fruits, whole grains, low-fat dairy products, and lean protein foods contain the nutrients you need without too many calories. Decrease your intake of foods high in solid fats, added sugars, and salt. Get information about a proper diet from your caregiver, if necessary.  Regular physical exercise is one of the most important things you can do for your health. Most adults should get at least 150 minutes of moderate-intensity exercise (any activity that increases your heart rate and causes you to sweat) each week. In addition, most adults need muscle-strengthening exercises on 2 or more days a week.   Maintain a healthy weight. The body mass index (BMI) is a screening tool to identify possible weight problems. It provides an estimate of body fat based on height and weight. Your caregiver can help determine your BMI, and can help you achieve or maintain a healthy weight. For adults 20 years and older:  A BMI below 18.5 is considered underweight.  A BMI of 18.5 to 24.9 is normal.  A BMI of 25 to 29.9 is considered overweight.  A BMI of 30 and above is considered obese.  Maintain normal blood lipids and cholesterol by  exercising and minimizing your intake of saturated fat. Eat a balanced diet with plenty of fruits and vegetables. Blood tests for lipids and cholesterol should begin at age 108 and be repeated every 5 years. If your lipid or cholesterol levels are high, you are over 50, or you are a high risk for heart disease, you may need your cholesterol levels checked more frequently.Ongoing high lipid and cholesterol levels should be treated with medicines if diet and exercise are not effective.  If you smoke, find out from your caregiver how to quit. If you do not use tobacco, do not start.  Lung cancer screening is recommended for adults aged 55-80 years who are at high risk for developing lung cancer because of a history of smoking. Yearly low-dose computed tomography (CT) is recommended for people who have at least a 30-pack-year history of smoking and are a current smoker or have quit within the past 15 years. A pack year of smoking is smoking an average of 1 pack of cigarettes a day for 1 year (for example: 1 pack a day for 30 years or 2 packs a day for 15 years). Yearly screening should continue until the smoker has stopped smoking for at least 15 years. Yearly screening should also be stopped for people who develop a health problem that would prevent them from having lung cancer treatment.  If you are pregnant, do not drink alcohol. If you are breastfeeding, be very cautious about drinking alcohol. If you are not pregnant and choose to drink alcohol, do not exceed 1 drink per day. One drink is  considered to be 12 ounces (355 mL) of beer, 5 ounces (148 mL) of wine, or 1.5 ounces (44 mL) of liquor.  Avoid use of street drugs. Do not share needles with anyone. Ask for help if you need support or instructions about stopping the use of drugs.  High blood pressure causes heart disease and increases the risk of stroke. Blood pressure should be checked at least every 1 to 2 years. Ongoing high blood pressure should be  treated with medicines, if weight loss and exercise are not effective.  If you are 74 to 75 years old, ask your caregiver if you should take aspirin to prevent strokes.  Diabetes screening involves taking a blood sample to check your fasting blood sugar level. This should be done once every 3 years, after age 32, if you are within normal weight and without risk factors for diabetes. Testing should be considered at a younger age or be carried out more frequently if you are overweight and have at least 1 risk factor for diabetes.  Breast cancer screening is essential preventative care for women. You should practice "breast self-awareness." This means understanding the normal appearance and feel of your breasts and may include breast self-examination. Any changes detected, no matter how small, should be reported to a caregiver. Women in their 50s and 30s should have a clinical breast exam (CBE) by a caregiver as part of a regular health exam every 1 to 3 years. After age 58, women should have a CBE every year. Starting at age 10, women should consider having a mammogram (breast X-ray) every year. Women who have a family history of breast cancer should talk to their caregiver about genetic screening. Women at a high risk of breast cancer should talk to their caregiver about having an MRI and a mammogram every year.  Breast cancer gene (BRCA)-related cancer risk assessment is recommended for women who have family members with BRCA-related cancers. BRCA-related cancers include breast, ovarian, tubal, and peritoneal cancers. Having family members with these cancers may be associated with an increased risk for harmful changes (mutations) in the breast cancer genes BRCA1 and BRCA2. Results of the assessment will determine the need for genetic counseling and BRCA1 and BRCA2 testing.  The Pap test is a screening test for cervical cancer. Women should have a Pap test starting at age 64. Between ages 11 and 48, Pap  tests should be repeated every 2 years. Beginning at age 61, you should have a Pap test every 3 years as long as the past 3 Pap tests have been normal. If you had a hysterectomy for a problem that was not cancer or a condition that could lead to cancer, then you no longer need Pap tests. If you are between ages 34 and 22, and you have had normal Pap tests going back 10 years, you no longer need Pap tests. If you have had past treatment for cervical cancer or a condition that could lead to cancer, you need Pap tests and screening for cancer for at least 20 years after your treatment. If Pap tests have been discontinued, risk factors (such as a new sexual partner) need to be reassessed to determine if screening should be resumed. Some women have medical problems that increase the chance of getting cervical cancer. In these cases, your caregiver may recommend more frequent screening and Pap tests.  The human papillomavirus (HPV) test is an additional test that may be used for cervical cancer screening. The HPV test looks for the  virus that can cause the cell changes on the cervix. The cells collected during the Pap test can be tested for HPV. The HPV test could be used to screen women aged 42 years and older, and should be used in women of any age who have unclear Pap test results. After the age of 55, women should have HPV testing at the same frequency as a Pap test.  Colorectal cancer can be detected and often prevented. Most routine colorectal cancer screening begins at the age of 66 and continues through age 73. However, your caregiver may recommend screening at an earlier age if you have risk factors for colon cancer. On a yearly basis, your caregiver may provide home test kits to check for hidden blood in the stool. Use of a small camera at the end of a tube, to directly examine the colon (sigmoidoscopy or colonoscopy), can detect the earliest forms of colorectal cancer. Talk to your caregiver about this at  age 54, when routine screening begins. Direct examination of the colon should be repeated every 5 to 10 years through age 2, unless early forms of pre-cancerous polyps or small growths are found.  Hepatitis C blood testing is recommended for all people born from [birth year] through 1967 and any individual with known risks for hepatitis C.  Practice safe sex. Use condoms and avoid high-risk sexual practices to reduce the spread of sexually transmitted infections (STIs). Sexually active women aged 51 and younger should be checked for Chlamydia, which is a common sexually transmitted infection. Older women with new or multiple partners should also be tested for Chlamydia. Testing for other STIs is recommended if you are sexually active and at increased risk.  Osteoporosis is a disease in which the bones lose minerals and strength with aging. This can result in serious bone fractures. The risk of osteoporosis can be identified using a bone density scan. Women ages 58 and over and women at risk for fractures or osteoporosis should discuss screening with their caregivers. Ask your caregiver whether you should be taking a calcium supplement or vitamin D to reduce the rate of osteoporosis.  Menopause can be associated with physical symptoms and risks. Hormone replacement therapy is available to decrease symptoms and risks. You should talk to your caregiver about whether hormone replacement therapy is right for you.  Use sunscreen. Apply sunscreen liberally and repeatedly throughout the day. You should seek shade when your shadow is shorter than you. Protect yourself by wearing long sleeves, pants, a wide-brimmed hat, and sunglasses year round, whenever you are outdoors.  Notify your caregiver of new moles or changes in moles, especially if there is a change in shape or color. Also notify your caregiver if a mole is larger than the size of a pencil eraser.  Stay current with your immunizations. Document Released:  05/17/2011 Document Revised: 02/26/2013 Document Reviewed: 10/03/2013 [facility] Patient Information 2015 [place], [place]. This information is not intended to replace advice given to you by your health care provider. Make sure you discuss any questions you have with your health care provider.

## 2014-05-30 NOTE — Progress Notes (Signed)
Pre visit review using our clinic review tool, if applicable. No additional management support is needed unless otherwise documented below in the visit note. 

## 2014-05-30 NOTE — Progress Notes (Signed)
Subjective:    Patient ID: [name], female    DOB: [birth year], 73 y.o.   MRN: [id number]  HPI   Here for medicare wellness  Diet: heart healthy Physical activity: very active - walks 7d/week Depression/mood screen: negative Hearing: intact to whispered voice with Hearing aide Visual acuity: grossly normal, performs annual eye exam  ADLs: capable Fall risk: none Home safety: good Cognitive evaluation: intact to orientation, naming, recall and repetition EOL planning: adv directives, full code/ I agree  I have personally reviewed and have noted 1. The patient's medical and social history 2. Their use of alcohol, tobacco or illicit drugs 3. Their current medications and supplements 4. The patient's functional ability including ADL's, fall risks, home safety risks and hearing or visual impairment. 5. Diet and physical activities 6. Evidence for depression or mood disorders  Also reviewed chronic medical issues and interval medical events  Past Medical History  Diagnosis Date  . Hypertension   . COPD (chronic obstructive pulmonary disease)   . Osteopenia   . Aortic stenosis, moderate   . History of DVT (deep vein thrombosis)   . Coronary artery disease     s/p CABGx5 2007  . ALLERGIC RHINITIS   . ASTHMA   . BREAST CANCER     left, 1991; B mastectomy  . CORONARY ARTERY DISEASE   . DIVERTICULOSIS   . DVT   . EROSIVE GASTRITIS   . GERD   . HYPERLIPIDEMIA   . HYPOTHYROIDISM   . Occlusion and stenosis of carotid artery without mention of cerebral infarction   . OSTEOPOROSIS   . RHINOSINUSITIS, RECURRENT    Family History  Problem Relation Age of Onset  . Colon cancer Sister   . Colon cancer Sister   . Cancer Mother     breast cancer  . Lung disease Mother   . Emphysema Father    History  Substance Use Topics  . Smoking status: Never Smoker   . Smokeless tobacco: Never Used  . Alcohol Use: No    Review of Systems  Constitutional: Positive for  fatigue. Negative for unexpected weight change.  Respiratory: Negative for cough, shortness of breath and wheezing.   Cardiovascular: Negative for chest pain, palpitations and leg swelling.  Gastrointestinal: Negative for nausea, abdominal pain and diarrhea.  Neurological: Negative for dizziness, weakness, light-headedness and headaches.  Psychiatric/Behavioral: Negative for dysphoric mood. The patient is not nervous/anxious.   All other systems reviewed and are negative.      Objective:   Physical Exam  BP 130/80  Pulse 55  Temp(Src) 98.2 F (36.8 C) (Oral)  Ht 5' 5.5" (1.664 m)  Wt 133 lb 6.4 oz (60.51 kg)  BMI 21.85 kg/m2  SpO2 95% Wt Readings from Last 3 Encounters:  05/30/14 133 lb 6.4 oz (60.51 kg)  04/03/14 131 lb (59.421 kg)  02/08/14 135 lb (61.236 kg)   Constitutional: She appears well-developed and well-nourished. No distress.  Neck: Normal range of motion. Neck supple. No JVD present. No thyromegaly present.  Cardiovascular: Normal rate, regular rhythm and normal heart sounds.  No murmur heard. No BLE edema. Pulmonary/Chest: Effort normal and breath sounds normal. No respiratory distress. She has no wheezes.  Psychiatric: She has a normal mood and affect. Her behavior is normal. Judgment and thought content normal.   Lab Results  Component Value Date   WBC 5.7 12/29/2010   HGB 12.3 12/29/2010   HCT 36.6 12/29/2010   PLT 236.0 12/29/2010   GLUCOSE 90 07/24/2012  CHOL 123 02/05/2014   TRIG 32.0 02/05/2014   HDL 49.50 02/05/2014   LDLCALC 67 02/05/2014   ALT 32 02/05/2014   AST 37 02/05/2014   NA 139 07/24/2012   K 3.9 07/24/2012   CL 104 07/24/2012   CREATININE 0.7 07/24/2012   BUN 17 07/24/2012   CO2 25 07/24/2012   TSH 0.98 03/29/2012   INR 1.0 ratio 10/26/2010    Dg Hip Complete Left  03/29/2012   *RADIOLOGY REPORT*  Clinical Data: Fall 10 days ago.  Left groin pain.  LEFT HIP - COMPLETE 2+ VIEW  Comparison: No comparison studies available.  Findings: No evidence for  fracture.  No subluxation.  SI joints and symphysis pubis are unremarkable.  Arcuate lines of the sacrum are preserved.  IMPRESSION: No acute bony findings.  No evidence to explain the patient's history of left groin pain.  Original Report Authenticated By: ERIC A. MANSELL, M.D.      Assessment & Plan:   AWV/v70.0 - Today patient counseled on age appropriate routine health concerns for screening and prevention, each reviewed and up to date or declined. Immunizations reviewed and up to date or declined. Labs/ECG reviewed. Risk factors for depression reviewed and negative. Hearing function and visual acuity are intact. ADLs screened and addressed as needed. Functional ability and level of safety reviewed and appropriate. Education, counseling and referrals performed based on assessed risks today. Patient provided with a copy of personalized plan for preventive services.  Fatigue - nonspecific symptoms/exam - check screening labs  Problem List Items Addressed This Visit   HYPERLIPIDEMIA     On statin for CAD hx Levels monitored by cards    HYPOTHYROIDISM      Lab Results  Component Value Date   TSH 0.98 03/29/2012  recheck TSH and adjust as needed The current medical regimen is effective;  continue present plan and medications.      Relevant Orders      TSH      Basic metabolic panel   Senile osteoporosis     Hx fracture - ankle and foot with accidental trauma (remote) DEXA 03/2013: -2.7 On fosamax for years, stopped in 2005 On Ca 1200mg  + WB exercised Check Vit D now    Relevant Orders      Vit D  25 hydroxy (rtn osteoporosis monitoring)    Other Visit Diagnoses   Routine general medical examination at a health care facility    -  Primary    Other malaise and fatigue        Relevant Orders       Vit D  25 hydroxy (rtn osteoporosis monitoring)       TSH       Basic metabolic panel       CBC with Differential       Hepatic function panel       Vitamin B12

## 2014-05-30 NOTE — Assessment & Plan Note (Signed)
On statin for CAD hx Levels monitored by cards

## 2014-05-30 NOTE — Assessment & Plan Note (Signed)
Lab Results  Component Value Date   TSH 0.98 03/29/2012  recheck TSH and adjust as needed The current medical regimen is effective;  continue present plan and medications.

## 2014-06-05 ENCOUNTER — Ambulatory Visit: Payer: Medicare Other

## 2014-06-05 ENCOUNTER — Ambulatory Visit: Payer: Medicare Other | Admitting: Internal Medicine

## 2014-06-12 ENCOUNTER — Ambulatory Visit (INDEPENDENT_AMBULATORY_CARE_PROVIDER_SITE_OTHER): Payer: Medicare Other

## 2014-06-12 DIAGNOSIS — J309 Allergic rhinitis, unspecified: Secondary | ICD-10-CM | POA: Diagnosis not present

## 2014-06-14 ENCOUNTER — Ambulatory Visit (INDEPENDENT_AMBULATORY_CARE_PROVIDER_SITE_OTHER): Payer: Medicare Other

## 2014-06-14 DIAGNOSIS — J309 Allergic rhinitis, unspecified: Secondary | ICD-10-CM

## 2014-06-19 ENCOUNTER — Ambulatory Visit (INDEPENDENT_AMBULATORY_CARE_PROVIDER_SITE_OTHER): Payer: Medicare Other

## 2014-06-19 DIAGNOSIS — J309 Allergic rhinitis, unspecified: Secondary | ICD-10-CM | POA: Diagnosis not present

## 2014-06-24 ENCOUNTER — Other Ambulatory Visit: Payer: Self-pay

## 2014-06-24 MED ORDER — LEVOTHYROXINE SODIUM 75 MCG PO TABS: ORAL_TABLET | ORAL | Status: DC

## 2014-06-25 ENCOUNTER — Encounter: Payer: Self-pay | Admitting: Internal Medicine

## 2014-06-26 ENCOUNTER — Ambulatory Visit (INDEPENDENT_AMBULATORY_CARE_PROVIDER_SITE_OTHER): Payer: Medicare Other

## 2014-06-26 DIAGNOSIS — J309 Allergic rhinitis, unspecified: Secondary | ICD-10-CM

## 2014-07-03 ENCOUNTER — Ambulatory Visit (INDEPENDENT_AMBULATORY_CARE_PROVIDER_SITE_OTHER): Payer: Medicare Other

## 2014-07-03 DIAGNOSIS — J309 Allergic rhinitis, unspecified: Secondary | ICD-10-CM | POA: Diagnosis not present

## 2014-07-10 ENCOUNTER — Ambulatory Visit (INDEPENDENT_AMBULATORY_CARE_PROVIDER_SITE_OTHER): Payer: Medicare Other

## 2014-07-10 DIAGNOSIS — Z9010 Acquired absence of unspecified breast and nipple: Secondary | ICD-10-CM | POA: Diagnosis not present

## 2014-07-10 DIAGNOSIS — Z853 Personal history of malignant neoplasm of breast: Secondary | ICD-10-CM | POA: Diagnosis not present

## 2014-07-10 DIAGNOSIS — T8549XA Other mechanical complication of breast prosthesis and implant, initial encounter: Secondary | ICD-10-CM | POA: Diagnosis not present

## 2014-07-10 DIAGNOSIS — J309 Allergic rhinitis, unspecified: Secondary | ICD-10-CM | POA: Diagnosis not present

## 2014-07-10 DIAGNOSIS — T85898A Other specified complication of other internal prosthetic devices, implants and grafts, initial encounter: Secondary | ICD-10-CM | POA: Diagnosis not present

## 2014-07-17 ENCOUNTER — Ambulatory Visit (INDEPENDENT_AMBULATORY_CARE_PROVIDER_SITE_OTHER): Payer: Medicare Other

## 2014-07-17 ENCOUNTER — Ambulatory Visit (INDEPENDENT_AMBULATORY_CARE_PROVIDER_SITE_OTHER): Payer: Medicare Other | Admitting: Internal Medicine

## 2014-07-17 ENCOUNTER — Telehealth: Payer: Self-pay | Admitting: Internal Medicine

## 2014-07-17 ENCOUNTER — Encounter: Payer: Self-pay | Admitting: Internal Medicine

## 2014-07-17 VITALS — BP 116/64 | HR 58 | Ht 65.5 in | Wt 137.0 lb

## 2014-07-17 DIAGNOSIS — J309 Allergic rhinitis, unspecified: Secondary | ICD-10-CM

## 2014-07-17 DIAGNOSIS — I2510 Atherosclerotic heart disease of native coronary artery without angina pectoris: Secondary | ICD-10-CM | POA: Diagnosis not present

## 2014-07-17 DIAGNOSIS — J45909 Unspecified asthma, uncomplicated: Secondary | ICD-10-CM | POA: Diagnosis not present

## 2014-07-17 DIAGNOSIS — J45998 Other asthma: Secondary | ICD-10-CM

## 2014-07-17 NOTE — Telephone Encounter (Signed)
Per today OV: Patient Instructions      Ok to use your antihistamine as needed- please call and let us know what it is   Called pt to confirm the above medication LMTCB x1

## 2014-07-17 NOTE — Patient Instructions (Addendum)
Ok to use your antihistamine as needed- please call and let us know what it is.  We are going to use up the last of your current allergy vaccine supply, then stop and see how you do without  Please call as needed

## 2014-07-17 NOTE — Progress Notes (Signed)
07/17/14- 9 yoF never smoker followed for allergic rhinitis, hx recurrent sinusitis, asthma, complicated by hx Breast CA, Aortic stenosis, CAD FOLLOWS FOR: Pt states she is doing well. Pt doing well with allergy injection, pt states she has pain at the injection site. Pain states the injection itself hurts during the actual injection of the medication, but no local reaction. Allergy vaccine 1;10 GO LOV 5/013. She feels she is "doing great" using her allergy vaccine and an over-the-counter antihistamine. No wheezing.  ROS-see HPI Constitutional:   No-   weight loss, night sweats, fevers, chills, fatigue, lassitude. HEENT:   No-  headaches, difficulty swallowing, tooth/dental problems, sore throat,       No-  sneezing, itching, ear ache, nasal congestion, post nasal drip,  CV:  No-   chest pain, orthopnea, PND, swelling in lower extremities, anasarca,                                  dizziness, palpitations Resp: No-   shortness of breath with exertion or at rest.              No-   productive cough,  No non-productive cough,  No- coughing up of blood.              No-   change in color of mucus.  No- wheezing.   Skin: No-   rash or lesions. GI:  No-   heartburn, indigestion, abdominal pain, nausea, vomiting,  GU:  MS:  No-   joint pain or swelling.  . Neuro-     nothing unusual Psych:  No- change in mood or affect. No depression or anxiety.  No memory loss.  OBJ- Physical Exam General- Alert, Oriented, Affect-appropriate, Distress- none acute Skin- rash-none, lesions- none, excoriation- none Lymphadenopathy- none Head- atraumatic            Eyes- Gross vision intact, PERRLA, conjunctivae and secretions clear            Ears- Hearing, canals-normal            Nose- Clear, no-Septal dev, mucus, polyps, erosion, perforation             Throat- Mallampati II , mucosa clear , drainage- none, tonsils- atrophic Neck- flexible , trachea midline, no stridor , thyroid nl, carotid no bruit Chest -  symmetrical excursion , unlabored           Heart/CV- RRR , no murmur , no gallop  , no rub, nl s1 s2                           - JVD- none , edema- none, stasis changes- none, varices- none           Lung- clear to P&A, wheeze- none, cough- none , dullness-none, rub- none           Chest wall- +s/p bilateral mastectomy, L axillary node dissection Abd-  Br/ Gen/ Rectal- Not done, not indicated Extrem- cyanosis- none, clubbing, none, atrophy- none, strength- nl Neuro- grossly intact to observation

## 2014-07-19 NOTE — Telephone Encounter (Signed)
lmomtcb for pt 

## 2014-07-22 NOTE — Assessment & Plan Note (Addendum)
Well controlled.we discussed allergy vaccine and agreed we could stop now and watch when her supply runs out

## 2014-07-23 NOTE — Telephone Encounter (Signed)
lmomtcb for pt 

## 2014-07-24 ENCOUNTER — Ambulatory Visit (INDEPENDENT_AMBULATORY_CARE_PROVIDER_SITE_OTHER): Payer: Medicare Other

## 2014-07-24 DIAGNOSIS — J309 Allergic rhinitis, unspecified: Secondary | ICD-10-CM

## 2014-07-24 NOTE — Telephone Encounter (Signed)
Spoke with pt.  She would like CY to know the name of the allergy med that helps her most is Wal-finate (chlorphenermine 4mg ).  Med added to med list.  Will sign off and route to CY as FYI.

## 2014-07-31 ENCOUNTER — Ambulatory Visit: Payer: Medicare Other

## 2014-08-07 ENCOUNTER — Ambulatory Visit (INDEPENDENT_AMBULATORY_CARE_PROVIDER_SITE_OTHER): Payer: Medicare Other

## 2014-08-07 ENCOUNTER — Ambulatory Visit (INDEPENDENT_AMBULATORY_CARE_PROVIDER_SITE_OTHER): Payer: Medicare Other | Admitting: Cardiovascular Disease

## 2014-08-07 ENCOUNTER — Encounter: Payer: Self-pay | Admitting: Cardiovascular Disease

## 2014-08-07 VITALS — BP 120/66 | HR 62 | Ht 65.5 in | Wt 136.8 lb

## 2014-08-07 DIAGNOSIS — J309 Allergic rhinitis, unspecified: Secondary | ICD-10-CM | POA: Diagnosis not present

## 2014-08-07 DIAGNOSIS — I6529 Occlusion and stenosis of unspecified carotid artery: Secondary | ICD-10-CM

## 2014-08-07 DIAGNOSIS — I2510 Atherosclerotic heart disease of native coronary artery without angina pectoris: Secondary | ICD-10-CM | POA: Diagnosis not present

## 2014-08-07 DIAGNOSIS — E785 Hyperlipidemia, unspecified: Secondary | ICD-10-CM | POA: Diagnosis not present

## 2014-08-07 NOTE — Patient Instructions (Signed)
Your physician recommends that you continue on your current medications as directed. Please refer to the Current Medication list given to you today.  Your physician recommends that you return for a FASTING lipid profile and lft in 6 months before your appointment  Your physician wants you to follow-up in: 6 months with Dr.Cooper You will receive a reminder letter in the mail two months in advance. If you don't receive a letter, please call our office to schedule the follow-up appointment.

## 2014-08-07 NOTE — Progress Notes (Signed)
HPI:  73 year old woman presenting for followup evaluation. The patient has coronary artery disease with history of CABG in 2007. She underwent cardiac catheterization in 2011 demonstrating patency of all of her bypass grafts and preserved LV function. Last lipids March 2015 showed cholesterol of 123, triglycerides 32, HDL 50, and LDL 67.  The patient is doing well today. She has no complaints. She denies chest pain, shortness of breath, edema, orthopnea, PND, lightheadedness, or palpitations. She continues to walk for exercise without exertional symptoms. Her medications have not changed.   Outpatient Encounter Prescriptions as of 08/07/2014  Medication Sig  . aspirin 81 MG tablet Take 81 mg by mouth daily.    Marland Kitchen atorvastatin (LIPITOR) 20 MG tablet Take 1 tablet (20 mg total) by mouth daily.  . Calcium Carbonate (CALTRATE 600 PO) Take 2 tablets by mouth daily.    . chlorpheniramine (WAL-FINATE) 4 MG tablet Take 4 mg by mouth.  . Coenzyme Q10 (CO Q-10 PO) Take 50 mg by mouth daily.    Marland Kitchen levothyroxine (SYNTHROID, LEVOTHROID) 75 MCG tablet TAKE 1 TABLET DAILY  . meloxicam (MOBIC) 15 MG tablet Take 1 tablet (15 mg total) by mouth daily. TAke 1/4 tablet or as directed  . metoprolol tartrate (LOPRESSOR) 25 MG tablet TAKE 1 TABLET TWICE A DAY  . multivitamin (THERAGRAN) per tablet Take 1 tablet by mouth daily.    Marland Kitchen senna-docusate (SENOKOT-S) 8.6-50 MG per tablet Take 1 tablet by mouth daily.    Marland Kitchen zolpidem (AMBIEN) 10 MG tablet Take 0.5 tablets (5 mg total) by mouth at bedtime as needed.    Allergies  Allergen Reactions  . Clarithromycin   . Codeine     REACTION: nausea  . Levofloxacin     REACTION: nausea  . Morphine   . Naproxen     REACTION: nausea  . Penicillins     REACTION: RASH    Past Medical History  Diagnosis Date  . Hypertension   . COPD (chronic obstructive pulmonary disease)   . Osteopenia   . Aortic stenosis, moderate   . History of DVT (deep vein thrombosis)   .  Coronary artery disease     s/p CABGx5 2007  . ALLERGIC RHINITIS   . ASTHMA   . BREAST CANCER     left, 1991; B mastectomy  . CORONARY ARTERY DISEASE   . DIVERTICULOSIS   . DVT   . EROSIVE GASTRITIS   . GERD   . HYPERLIPIDEMIA   . HYPOTHYROIDISM   . Occlusion and stenosis of carotid artery without mention of cerebral infarction   . OSTEOPOROSIS   . RHINOSINUSITIS, RECURRENT     BP 120/66  Pulse 62  Ht 5' 5.5" (1.664 m)  Wt 136 lb 12.8 oz (62.052 kg)  BMI 22.41 kg/m2  PHYSICAL EXAM: Pt is alert and oriented, NAD HEENT: normal Neck: JVP - normal, carotids 2+= without bruits Lungs: CTA bilaterally CV: RRR with grade 2/6 early peaking systolic ejection murmur at the upper sternal border Abd: soft, NT, Positive BS, no hepatomegaly Ext: no C/C/E, distal pulses intact and equal Skin: warm/dry no rash  EKG:  Normal sinus rhythm 62 beats per minute, within normal limits.  ASSESSMENT AND PLAN: 1. CAD status post CABG, without anginal symptoms. The patient is stable and we will continue on her current medical program. This was reviewed today.  2. Hyperlipidemia. Most recent lipids reviewed. Will repeat blood work prior to her next visit in 6 months. She continues on atorvastatin 20  mg daily.  3. Heart murmur. Suspect mild aortic stenosis. She is completely asymptomatic. Continued clinical followup.  [name] 08/07/2014 2:52 PM

## 2014-08-14 ENCOUNTER — Ambulatory Visit (INDEPENDENT_AMBULATORY_CARE_PROVIDER_SITE_OTHER): Payer: Medicare Other

## 2014-08-14 DIAGNOSIS — J309 Allergic rhinitis, unspecified: Secondary | ICD-10-CM

## 2014-08-20 ENCOUNTER — Telehealth: Payer: Self-pay | Admitting: Internal Medicine

## 2014-08-20 ENCOUNTER — Other Ambulatory Visit: Payer: Self-pay

## 2014-08-20 DIAGNOSIS — M533 Sacrococcygeal disorders, not elsewhere classified: Secondary | ICD-10-CM

## 2014-08-20 DIAGNOSIS — M25551 Pain in right hip: Secondary | ICD-10-CM

## 2014-08-20 MED ORDER — MELOXICAM 15 MG PO TABS: 15.0000 mg | ORAL_TABLET | Freq: Every day | ORAL | Status: DC

## 2014-08-20 NOTE — Telephone Encounter (Signed)
This has been completed.

## 2014-08-20 NOTE — Telephone Encounter (Signed)
Patient is requesting script for [facility] day supply)

## 2014-08-21 ENCOUNTER — Ambulatory Visit (INDEPENDENT_AMBULATORY_CARE_PROVIDER_SITE_OTHER): Payer: Medicare Other

## 2014-08-21 ENCOUNTER — Other Ambulatory Visit: Payer: Self-pay

## 2014-08-21 DIAGNOSIS — J309 Allergic rhinitis, unspecified: Secondary | ICD-10-CM | POA: Diagnosis not present

## 2014-08-22 ENCOUNTER — Other Ambulatory Visit: Payer: Self-pay

## 2014-08-22 MED ORDER — ZOLPIDEM TARTRATE 10 MG PO TABS: 5.0000 mg | ORAL_TABLET | Freq: Every evening | ORAL | Status: DC | PRN

## 2014-08-22 NOTE — Telephone Encounter (Signed)
[facility] requested

## 2014-08-23 ENCOUNTER — Other Ambulatory Visit: Payer: Self-pay

## 2014-08-23 DIAGNOSIS — M25551 Pain in right hip: Secondary | ICD-10-CM

## 2014-08-23 DIAGNOSIS — M533 Sacrococcygeal disorders, not elsewhere classified: Secondary | ICD-10-CM

## 2014-08-23 MED ORDER — MELOXICAM 15 MG PO TABS: 15.0000 mg | ORAL_TABLET | Freq: Every day | ORAL | Status: DC

## 2014-08-26 DIAGNOSIS — L570 Actinic keratosis: Secondary | ICD-10-CM | POA: Diagnosis not present

## 2014-08-26 DIAGNOSIS — L821 Other seborrheic keratosis: Secondary | ICD-10-CM | POA: Diagnosis not present

## 2014-08-26 DIAGNOSIS — Z85828 Personal history of other malignant neoplasm of skin: Secondary | ICD-10-CM | POA: Diagnosis not present

## 2014-08-28 ENCOUNTER — Ambulatory Visit: Payer: Medicare Other

## 2014-09-04 ENCOUNTER — Ambulatory Visit: Payer: Medicare Other

## 2014-09-05 ENCOUNTER — Ambulatory Visit (INDEPENDENT_AMBULATORY_CARE_PROVIDER_SITE_OTHER): Payer: Medicare Other

## 2014-09-05 DIAGNOSIS — J309 Allergic rhinitis, unspecified: Secondary | ICD-10-CM

## 2014-09-11 ENCOUNTER — Ambulatory Visit (INDEPENDENT_AMBULATORY_CARE_PROVIDER_SITE_OTHER): Payer: Medicare Other

## 2014-09-11 DIAGNOSIS — J309 Allergic rhinitis, unspecified: Secondary | ICD-10-CM | POA: Diagnosis not present

## 2014-09-17 ENCOUNTER — Ambulatory Visit (INDEPENDENT_AMBULATORY_CARE_PROVIDER_SITE_OTHER): Payer: Medicare Other

## 2014-09-17 DIAGNOSIS — J309 Allergic rhinitis, unspecified: Secondary | ICD-10-CM | POA: Diagnosis not present

## 2014-09-18 ENCOUNTER — Ambulatory Visit: Payer: Medicare Other

## 2014-09-20 ENCOUNTER — Encounter: Payer: Self-pay | Admitting: Internal Medicine

## 2014-09-25 ENCOUNTER — Ambulatory Visit: Payer: Medicare Other

## 2014-09-27 ENCOUNTER — Telehealth: Payer: Self-pay | Admitting: Pulmonary Disease

## 2014-09-27 ENCOUNTER — Ambulatory Visit (INDEPENDENT_AMBULATORY_CARE_PROVIDER_SITE_OTHER): Payer: Medicare Other

## 2014-09-27 DIAGNOSIS — J309 Allergic rhinitis, unspecified: Secondary | ICD-10-CM | POA: Diagnosis not present

## 2014-09-27 NOTE — Telephone Encounter (Signed)
Restart allergy vaccine 1:10, starting at 0.1 ml/ vial and rebuilding to 0.5 ml/ vial per patient request

## 2014-09-27 NOTE — Telephone Encounter (Signed)
LMTCB

## 2014-09-27 NOTE — Telephone Encounter (Signed)
Called and spoke to pt. Pt stated she would like to be back on the allergy injections d/t her caring for her terminally ill niece and does not want her getting her niece sick. Pt last seen by CY on 07/17/14.   CY please advise.    Patient Instructions     Ok to use your antihistamine as needed- please call and let us know what it is.  We are going to use up the last of your current allergy vaccine supply, then stop and see how you do without  Please call as needed

## 2014-09-30 NOTE — Telephone Encounter (Signed)
Will forward to CY and then let Alroy Bailiff in allergy know of this as well.

## 2014-09-30 NOTE — Telephone Encounter (Signed)
Ok to continue allergy vaccine

## 2014-09-30 NOTE — Telephone Encounter (Signed)
Spoke with pt - Reports she was calling to let Allergy Lab know she is choosing to continue with Allergy Vaccine and will not be stopping it.  Pt has a pending appt for next Allergy Shot this coming Wednesday and would like to ensure she has enough serum for this injection.  Also, would like to ensure more serum is "ordered" so she doesn't run out.  Joellen Jersey, will you please assist with making sure this is taken care of for this pt.  Thank you.

## 2014-09-30 NOTE — Telephone Encounter (Signed)
[id number] x1 

## 2014-10-01 NOTE — Telephone Encounter (Signed)
Noted  

## 2014-10-02 ENCOUNTER — Ambulatory Visit (INDEPENDENT_AMBULATORY_CARE_PROVIDER_SITE_OTHER): Payer: Medicare Other

## 2014-10-02 DIAGNOSIS — J309 Allergic rhinitis, unspecified: Secondary | ICD-10-CM | POA: Diagnosis not present

## 2014-10-08 ENCOUNTER — Ambulatory Visit (INDEPENDENT_AMBULATORY_CARE_PROVIDER_SITE_OTHER): Payer: Medicare Other

## 2014-10-08 DIAGNOSIS — J309 Allergic rhinitis, unspecified: Secondary | ICD-10-CM

## 2014-10-09 ENCOUNTER — Ambulatory Visit: Payer: Medicare Other

## 2014-10-16 ENCOUNTER — Ambulatory Visit (INDEPENDENT_AMBULATORY_CARE_PROVIDER_SITE_OTHER): Payer: Medicare Other

## 2014-10-16 DIAGNOSIS — J309 Allergic rhinitis, unspecified: Secondary | ICD-10-CM | POA: Diagnosis not present

## 2014-10-17 ENCOUNTER — Ambulatory Visit: Payer: Medicare Other

## 2014-10-23 ENCOUNTER — Ambulatory Visit (INDEPENDENT_AMBULATORY_CARE_PROVIDER_SITE_OTHER): Payer: Medicare Other

## 2014-10-23 DIAGNOSIS — J309 Allergic rhinitis, unspecified: Secondary | ICD-10-CM

## 2014-10-30 ENCOUNTER — Ambulatory Visit (INDEPENDENT_AMBULATORY_CARE_PROVIDER_SITE_OTHER): Payer: Medicare Other

## 2014-10-30 DIAGNOSIS — J309 Allergic rhinitis, unspecified: Secondary | ICD-10-CM | POA: Diagnosis not present

## 2014-10-31 ENCOUNTER — Other Ambulatory Visit: Payer: Self-pay | Admitting: Cardiovascular Disease

## 2014-11-01 ENCOUNTER — Ambulatory Visit (INDEPENDENT_AMBULATORY_CARE_PROVIDER_SITE_OTHER): Payer: Medicare Other

## 2014-11-01 DIAGNOSIS — J309 Allergic rhinitis, unspecified: Secondary | ICD-10-CM | POA: Diagnosis not present

## 2014-11-06 ENCOUNTER — Ambulatory Visit (INDEPENDENT_AMBULATORY_CARE_PROVIDER_SITE_OTHER): Payer: Medicare Other

## 2014-11-06 DIAGNOSIS — J309 Allergic rhinitis, unspecified: Secondary | ICD-10-CM

## 2014-11-13 ENCOUNTER — Ambulatory Visit (INDEPENDENT_AMBULATORY_CARE_PROVIDER_SITE_OTHER): Payer: Medicare Other

## 2014-11-13 DIAGNOSIS — J309 Allergic rhinitis, unspecified: Secondary | ICD-10-CM | POA: Diagnosis not present

## 2014-11-20 ENCOUNTER — Ambulatory Visit (INDEPENDENT_AMBULATORY_CARE_PROVIDER_SITE_OTHER): Payer: Medicare Other

## 2014-11-20 ENCOUNTER — Encounter: Payer: Self-pay | Admitting: Internal Medicine

## 2014-11-20 DIAGNOSIS — J309 Allergic rhinitis, unspecified: Secondary | ICD-10-CM

## 2014-11-26 ENCOUNTER — Other Ambulatory Visit: Payer: Self-pay | Admitting: Internal Medicine

## 2014-11-27 ENCOUNTER — Ambulatory Visit (INDEPENDENT_AMBULATORY_CARE_PROVIDER_SITE_OTHER): Payer: Medicare Other

## 2014-11-27 DIAGNOSIS — J309 Allergic rhinitis, unspecified: Secondary | ICD-10-CM

## 2014-12-04 ENCOUNTER — Ambulatory Visit (INDEPENDENT_AMBULATORY_CARE_PROVIDER_SITE_OTHER): Payer: Medicare Other

## 2014-12-04 DIAGNOSIS — J309 Allergic rhinitis, unspecified: Secondary | ICD-10-CM

## 2014-12-11 ENCOUNTER — Ambulatory Visit (INDEPENDENT_AMBULATORY_CARE_PROVIDER_SITE_OTHER): Payer: Medicare Other

## 2014-12-11 DIAGNOSIS — J309 Allergic rhinitis, unspecified: Secondary | ICD-10-CM | POA: Diagnosis not present

## 2014-12-11 DIAGNOSIS — Z23 Encounter for immunization: Secondary | ICD-10-CM | POA: Diagnosis not present

## 2014-12-18 ENCOUNTER — Ambulatory Visit (INDEPENDENT_AMBULATORY_CARE_PROVIDER_SITE_OTHER): Payer: Medicare Other

## 2014-12-18 DIAGNOSIS — J309 Allergic rhinitis, unspecified: Secondary | ICD-10-CM | POA: Diagnosis not present

## 2014-12-25 ENCOUNTER — Ambulatory Visit (INDEPENDENT_AMBULATORY_CARE_PROVIDER_SITE_OTHER): Payer: Medicare Other

## 2014-12-25 DIAGNOSIS — J309 Allergic rhinitis, unspecified: Secondary | ICD-10-CM | POA: Diagnosis not present

## 2015-01-01 ENCOUNTER — Ambulatory Visit (INDEPENDENT_AMBULATORY_CARE_PROVIDER_SITE_OTHER): Payer: Medicare Other

## 2015-01-01 DIAGNOSIS — J309 Allergic rhinitis, unspecified: Secondary | ICD-10-CM | POA: Diagnosis not present

## 2015-01-07 DIAGNOSIS — Z85828 Personal history of other malignant neoplasm of skin: Secondary | ICD-10-CM | POA: Diagnosis not present

## 2015-01-07 DIAGNOSIS — L570 Actinic keratosis: Secondary | ICD-10-CM | POA: Diagnosis not present

## 2015-01-07 DIAGNOSIS — D485 Neoplasm of uncertain behavior of skin: Secondary | ICD-10-CM | POA: Diagnosis not present

## 2015-01-07 DIAGNOSIS — B070 Plantar wart: Secondary | ICD-10-CM | POA: Diagnosis not present

## 2015-01-07 DIAGNOSIS — L84 Corns and callosities: Secondary | ICD-10-CM | POA: Diagnosis not present

## 2015-01-07 DIAGNOSIS — D2271 Melanocytic nevi of right lower limb, including hip: Secondary | ICD-10-CM | POA: Diagnosis not present

## 2015-01-07 DIAGNOSIS — L821 Other seborrheic keratosis: Secondary | ICD-10-CM | POA: Diagnosis not present

## 2015-01-08 ENCOUNTER — Ambulatory Visit (INDEPENDENT_AMBULATORY_CARE_PROVIDER_SITE_OTHER): Payer: Medicare Other

## 2015-01-08 DIAGNOSIS — J309 Allergic rhinitis, unspecified: Secondary | ICD-10-CM | POA: Diagnosis not present

## 2015-01-15 ENCOUNTER — Ambulatory Visit: Payer: Medicare Other

## 2015-01-20 ENCOUNTER — Other Ambulatory Visit: Payer: Self-pay | Admitting: Cardiovascular Disease

## 2015-01-20 DIAGNOSIS — Z124 Encounter for screening for malignant neoplasm of cervix: Secondary | ICD-10-CM | POA: Diagnosis not present

## 2015-01-20 DIAGNOSIS — Z01419 Encounter for gynecological examination (general) (routine) without abnormal findings: Secondary | ICD-10-CM | POA: Diagnosis not present

## 2015-01-22 ENCOUNTER — Ambulatory Visit (INDEPENDENT_AMBULATORY_CARE_PROVIDER_SITE_OTHER): Payer: Medicare Other

## 2015-01-22 DIAGNOSIS — J309 Allergic rhinitis, unspecified: Secondary | ICD-10-CM | POA: Diagnosis not present

## 2015-01-24 DIAGNOSIS — H5203 Hypermetropia, bilateral: Secondary | ICD-10-CM | POA: Diagnosis not present

## 2015-01-24 DIAGNOSIS — H524 Presbyopia: Secondary | ICD-10-CM | POA: Diagnosis not present

## 2015-01-24 DIAGNOSIS — H04123 Dry eye syndrome of bilateral lacrimal glands: Secondary | ICD-10-CM | POA: Diagnosis not present

## 2015-01-24 DIAGNOSIS — H2513 Age-related nuclear cataract, bilateral: Secondary | ICD-10-CM | POA: Diagnosis not present

## 2015-01-27 ENCOUNTER — Encounter: Payer: Self-pay | Admitting: Internal Medicine

## 2015-01-29 ENCOUNTER — Ambulatory Visit (INDEPENDENT_AMBULATORY_CARE_PROVIDER_SITE_OTHER): Payer: Medicare Other

## 2015-01-29 DIAGNOSIS — J309 Allergic rhinitis, unspecified: Secondary | ICD-10-CM | POA: Diagnosis not present

## 2015-01-30 ENCOUNTER — Other Ambulatory Visit (INDEPENDENT_AMBULATORY_CARE_PROVIDER_SITE_OTHER): Payer: Medicare Other | Admitting: *Deleted

## 2015-01-30 DIAGNOSIS — E785 Hyperlipidemia, unspecified: Secondary | ICD-10-CM

## 2015-01-30 LAB — HEPATIC FUNCTION PANEL
ALT: 19 U/L (ref 0–35)
AST: 24 U/L (ref 0–37)
Albumin: 4 g/dL (ref 3.5–5.2)
Alkaline Phosphatase: 68 U/L (ref 39–117)
BILIRUBIN DIRECT: 0.1 mg/dL (ref 0.0–0.3)
BILIRUBIN TOTAL: 0.8 mg/dL (ref 0.2–1.2)
TOTAL PROTEIN: 6.3 g/dL (ref 6.0–8.3)

## 2015-01-30 LAB — LIPID PANEL
CHOLESTEROL: 133 mg/dL (ref 0–200)
HDL: 45.8 mg/dL (ref >39.00)
LDL CALC: 75 mg/dL (ref 0–99)
NONHDL: 87.2
Total CHOL/HDL Ratio: 3
Triglycerides: 62 mg/dL (ref 0.0–149.0)
VLDL: 12.4 mg/dL (ref 0.0–40.0)

## 2015-01-30 NOTE — Addendum Note (Signed)
Addended by: Eulis Foster on: 01/30/2015 10:36 AM   Modules accepted: Orders

## 2015-02-03 ENCOUNTER — Ambulatory Visit (INDEPENDENT_AMBULATORY_CARE_PROVIDER_SITE_OTHER): Payer: Medicare Other | Admitting: Cardiovascular Disease

## 2015-02-03 ENCOUNTER — Encounter: Payer: Self-pay | Admitting: Cardiovascular Disease

## 2015-02-03 VITALS — BP 132/72 | HR 63 | Ht 65.5 in | Wt 137.4 lb

## 2015-02-03 DIAGNOSIS — E785 Hyperlipidemia, unspecified: Secondary | ICD-10-CM | POA: Diagnosis not present

## 2015-02-03 DIAGNOSIS — I25708 Atherosclerosis of coronary artery bypass graft(s), unspecified, with other forms of angina pectoris: Secondary | ICD-10-CM | POA: Diagnosis not present

## 2015-02-03 DIAGNOSIS — R0989 Other specified symptoms and signs involving the circulatory and respiratory systems: Secondary | ICD-10-CM

## 2015-02-03 DIAGNOSIS — I208 Other forms of angina pectoris: Secondary | ICD-10-CM | POA: Diagnosis not present

## 2015-02-03 DIAGNOSIS — I359 Nonrheumatic aortic valve disorder, unspecified: Secondary | ICD-10-CM

## 2015-02-03 NOTE — Patient Instructions (Signed)
Your physician has requested that you have an echocardiogram. Echocardiography is a painless test that uses sound waves to create images of your heart. It provides your doctor with information about the size and shape of your heart and how well your heart's chambers and valves are working. This procedure takes approximately one hour. There are no restrictions for this procedure.  Your physician has requested that you have a carotid duplex. This test is an ultrasound of the carotid arteries in your neck. It looks at blood flow through these arteries that supply the brain with blood. Allow one hour for this exam. There are no restrictions or special instructions.  Your physician has requested that you have an exercise stress myoview. For further information please visit [email or web address]. Please follow instruction sheet, as given.  Your physician recommends that you continue on your current medications as directed. Please refer to the Current Medication list given to you today.  Your physician wants you to follow-up in: 6 MONTHS with Dr Burt Knack.  You will receive a reminder letter in the mail two months in advance. If you don't receive a letter, please call our office to schedule the follow-up appointment.

## 2015-02-03 NOTE — Progress Notes (Signed)
Cardiology Office Note   Date:  02/04/2015   ID:  Birdell, Frasier 1941-10-04, MRN [id number]  PCP:  Gwendolyn Grant, MD  Cardiologist:  [name], MD    Chief Complaint  Patient presents with  . Chest Pain     History of Present Illness: [name] is a 74 y.o. female who presents for follow-up of CAD. The patient has coronary artery disease with history of CABG in 2007. She underwent cardiac catheterization in 2011 demonstrating patency of all of her bypass grafts and preserved LV function. Last lipids in March 2016 showed a cholesterol 133, HDL 46, LDL 75.  She had an episode of chest pain this past weekend when she was trying to keep up with her son-in-law. She was walking faster than her normal pace and also faster than she walks with exercise. She described a substernal pressure in her chest. Symptoms resolved within 2 minutes of rest.  Also had associated shortness of breath.  She denies any other episodes of chest discomfort. She otherwise has felt well. Her medications are unchanged. She denies edema, orthopnea, or PND.  Past Medical History  Diagnosis Date  . Hypertension   . COPD (chronic obstructive pulmonary disease)   . Osteopenia   . Aortic stenosis, moderate   . History of DVT (deep vein thrombosis)   . Coronary artery disease     s/p CABGx5 2007  . ALLERGIC RHINITIS   . ASTHMA   . BREAST CANCER     left, 1991; B mastectomy  . CORONARY ARTERY DISEASE   . DIVERTICULOSIS   . DVT   . EROSIVE GASTRITIS   . GERD   . HYPERLIPIDEMIA   . HYPOTHYROIDISM   . Occlusion and stenosis of carotid artery without mention of cerebral infarction   . OSTEOPOROSIS   . RHINOSINUSITIS, RECURRENT     Past Surgical History  Procedure Laterality Date  . Colonoscopy    . Upper gastrointestinal endoscopy    . Mastectomy      left with reconstruction and lymph node excision  1992  . Mastectomy      right with reconstruction  . Tonsillectomy and adenoidectomy    .  Appendectomy    . Vaginal hysterectomy      ovaries not excised  . Coronary artery bypass graft      5        2007  . Right ankle arthroscopy    . Revision reconstructed breast Left 02/2014    [facility]ard (plastics in [place])    Current Outpatient Prescriptions  Medication Sig Dispense Refill  . aspirin 81 MG tablet Take 81 mg by mouth daily.      Marland Kitchen atorvastatin (LIPITOR) 20 MG tablet Take 1 tablet (20 mg total) by mouth daily. 90 tablet 3  . Calcium Carbonate (CALTRATE 600 PO) Take 2 tablets by mouth daily.      . Coenzyme Q10 (CO Q-10 PO) Take 50 mg by mouth daily.      . fluticasone (FLONASE) 50 MCG/ACT nasal spray USE 2 SPRAYS IN EACH NOSTRIL ONCE DAILY 16 g 5  . levothyroxine (SYNTHROID, LEVOTHROID) 75 MCG tablet TAKE 1 TABLET DAILY 90 tablet 3  . meloxicam (MOBIC) 15 MG tablet Take 1 tablet (15 mg total) by mouth daily. 90 tablet 1  . metoprolol tartrate (LOPRESSOR) 25 MG tablet TAKE 1 TABLET TWICE A DAY 180 tablet 2  . multivitamin (THERAGRAN) per tablet Take 1 tablet by mouth daily.      Marland Kitchen  pantoprazole (PROTONIX) 40 MG tablet Take 40 mg by mouth daily.    Marland Kitchen senna-docusate (SENOKOT-S) 8.6-50 MG per tablet Take 1 tablet by mouth daily.      Marland Kitchen zolpidem (AMBIEN) 10 MG tablet Take 0.5 tablets (5 mg total) by mouth at bedtime as needed. 45 tablet 5   No current facility-administered medications for this visit.    Allergies:   Clarithromycin; Codeine; Levofloxacin; Morphine; Naproxen; and Penicillins   Social History:  The patient  reports that she has never smoked. She has never used smokeless tobacco. She reports that she does not drink alcohol or use illicit drugs.   Family History:  The patient's family history includes Cancer in her mother; Colon cancer in her sister and sister; Emphysema in her father; Lung disease in her mother.    ROS:  Please see the history of present illness.  Otherwise, review of systems is positive for chest pain, hearing loss, visual disturbance, muscle pain.   All other systems are reviewed and negative.    PHYSICAL EXAM: VS:  BP 132/72 mmHg  Pulse 63  Ht 5' 5.5" (1.664 m)  Wt 137 lb 6.4 oz (62.324 kg)  BMI 22.51 kg/m2 , BMI Body mass index is 22.51 kg/(m^2). GEN: Well nourished, well developed, in no acute distress HEENT: normal Neck: no JVD, no masses. Bilateral carotid bruits Cardiac: RRR harsh mid-peaking grade 2/6 systolic murmur at the right upper sternal border with preserved A2              Respiratory:  clear to auscultation bilaterally, normal work of breathing GI: soft, nontender, nondistended, + BS MS: no deformity or atrophy Ext: no pretibial edema, pedal pulses 2+= bilaterally Skin: warm and dry, no rash Neuro:  Strength and sensation are intact Psych: euthymic mood, full affect  EKG:  EKG is ordered today. The ekg ordered today shows NSR 63 bpm, possible LAE, otherwise within normal limits.  Recent Labs: 05/30/2014: BUN 19; Creatinine 0.7; Hemoglobin 14.1; Platelets 276.0; Potassium 4.3; Sodium 139; TSH 1.94 01/30/2015: ALT 19   Lipid Panel     Component Value Date/Time   CHOL 133 01/30/2015 1036   TRIG 62.0 01/30/2015 1036   HDL 45.80 01/30/2015 1036   CHOLHDL 3 01/30/2015 1036   VLDL 12.4 01/30/2015 1036   LDLCALC 75 01/30/2015 1036      Wt Readings from Last 3 Encounters:  02/03/15 137 lb 6.4 oz (62.324 kg)  08/07/14 136 lb 12.8 oz (62.052 kg)  07/17/14 137 lb (62.143 kg)     ASSESSMENT AND PLAN: 1.  CAD, native vessel, with exertional angina. The recent episode of chest pain/pressure with exertion sounds highly consistent with anginal chest pain. She exerted a little more than her normal, but I am concerned as this is her first complaint of exertional pain. She's had previous CABG approaching 10 years out. I have recommended an exercise Myoview stress test. Her medications will be continued without change.  2. Systolic heart murmur: Murmur is consistent with aortic stenosis. I've recommended an updated  echo as it has been several years since her last study and this could play a role in anginal symptoms.  3. Bilateral carotid bruits: Recommend repeat carotid duplex scan.  4. Hyperlipidemia: Recent lipids reviewed and they are at goal. Will continue atorvastatin 20 mg.   Current medicines are reviewed with the patient today.  The patient does not have concerns regarding medicines.  The following changes have been made:  no change  Labs/ tests ordered  today include:   Orders Placed This Encounter  Procedures  . Myocardial Perfusion Imaging  . EKG 12-Lead  . 2D Echocardiogram with contrast  . Carotid duplex    Disposition:   FU 6 months  Signed, [name], MD  02/04/2015 12:00 AM    [address], [place], [place]  [address] Phone: [phone]; Fax: [phone]

## 2015-02-04 ENCOUNTER — Ambulatory Visit: Payer: Medicare Other

## 2015-02-05 ENCOUNTER — Ambulatory Visit (INDEPENDENT_AMBULATORY_CARE_PROVIDER_SITE_OTHER): Payer: Medicare Other

## 2015-02-05 ENCOUNTER — Encounter: Payer: Self-pay | Admitting: Internal Medicine

## 2015-02-05 DIAGNOSIS — J309 Allergic rhinitis, unspecified: Secondary | ICD-10-CM | POA: Diagnosis not present

## 2015-02-07 ENCOUNTER — Ambulatory Visit (HOSPITAL_COMMUNITY): Payer: Medicare Other | Attending: Cardiology | Admitting: Cardiology

## 2015-02-07 ENCOUNTER — Ambulatory Visit (HOSPITAL_BASED_OUTPATIENT_CLINIC_OR_DEPARTMENT_OTHER): Payer: Medicare Other | Admitting: Radiology

## 2015-02-07 DIAGNOSIS — I6523 Occlusion and stenosis of bilateral carotid arteries: Secondary | ICD-10-CM

## 2015-02-07 DIAGNOSIS — R0989 Other specified symptoms and signs involving the circulatory and respiratory systems: Secondary | ICD-10-CM | POA: Diagnosis not present

## 2015-02-07 DIAGNOSIS — I359 Nonrheumatic aortic valve disorder, unspecified: Secondary | ICD-10-CM

## 2015-02-07 NOTE — Progress Notes (Signed)
Echocardiogram performed.  

## 2015-02-07 NOTE — Progress Notes (Signed)
Carotid duplex performed 

## 2015-02-10 ENCOUNTER — Ambulatory Visit (HOSPITAL_COMMUNITY): Payer: Medicare Other | Attending: Internal Medicine | Admitting: Radiology

## 2015-02-10 ENCOUNTER — Encounter: Payer: Self-pay | Admitting: Cardiology

## 2015-02-10 VITALS — BP 149/83 | Ht 65.0 in | Wt 134.0 lb

## 2015-02-10 DIAGNOSIS — R0602 Shortness of breath: Secondary | ICD-10-CM | POA: Diagnosis not present

## 2015-02-10 DIAGNOSIS — I208 Other forms of angina pectoris: Secondary | ICD-10-CM

## 2015-02-10 DIAGNOSIS — R079 Chest pain, unspecified: Secondary | ICD-10-CM

## 2015-02-10 DIAGNOSIS — I25708 Atherosclerosis of coronary artery bypass graft(s), unspecified, with other forms of angina pectoris: Secondary | ICD-10-CM | POA: Insufficient documentation

## 2015-02-10 MED ORDER — AMINOPHYLLINE 25 MG/ML IV SOLN
75.0000 mg | Freq: Once | INTRAVENOUS | Status: AC
  Administered 2015-02-10: 75 mg via INTRAVENOUS

## 2015-02-10 MED ORDER — TECHNETIUM TC 99M SESTAMIBI GENERIC - CARDIOLITE
11.0000 | Freq: Once | INTRAVENOUS | Status: AC | PRN
  Administered 2015-02-10: 11 via INTRAVENOUS

## 2015-02-10 MED ORDER — REGADENOSON 0.4 MG/5ML IV SOLN
0.4000 mg | Freq: Once | INTRAVENOUS | Status: AC
  Administered 2015-02-10: 0.4 mg via INTRAVENOUS

## 2015-02-10 MED ORDER — TECHNETIUM TC 99M SESTAMIBI GENERIC - CARDIOLITE
33.0000 | Freq: Once | INTRAVENOUS | Status: AC | PRN
  Administered 2015-02-10: 33 via INTRAVENOUS

## 2015-02-10 NOTE — Progress Notes (Signed)
  [facility] [address] [place], [place] [address] [phone]    Cardiology Nuclear Med Study  [name] is a 74 y.o. female     MRN : [id number]     DOB: February 09, 1941  Procedure Date: 02/10/2015  Nuclear Med Background Indication for Stress Test:  Evaluation for Ischemia History:  COPD and CAD-CABG, '10 MPI: NL EF: 82% Cardiac Risk Factors: Carotid Disease and Hypertension  Symptoms:  Chest Pain, DOE, Palpitations and SOB   Nuclear Pre-Procedure Caffeine/Decaff Intake:  12:00am NPO After: 7:00am   Lungs:  clear O2 Sat: 97% on room air. IV 0.9% NS with Angio Cath:  22g  IV Site: R Hand  IV Started by:  Matilde Haymaker, RN  Chest Size (in):  36 Cup Size: C  Height: 5\' 5"  (1.651 m)  Weight:  134 lb (60.782 kg)  BMI:  Body mass index is 22.3 kg/(m^2). Tech Comments:  No Lopressor x 30 hrs. Aminophylline 75 mg given for symptoms. All were resolved before leaving. S.Williams EMTP    Nuclear Med Study 1 or 2 day study: 1 day  Stress Test Type:  Carlton Adam  Reading MD: n/a  Order Authorizing Provider:  Legrand Como Cooper,MD  Resting Radionuclide: Technetium 31m Sestamibi  Resting Radionuclide Dose: 11.0 mCi   Stress Radionuclide:  Technetium 66m Sestamibi  Stress Radionuclide Dose: 33.0 mCi           Stress Protocol Rest HR: 71 Stress HR: 100  Rest BP: 149/83 Stress BP: 181/36  Exercise Time (min): n/a METS: n/a   Predicted Max HR: 144 bpm % Max HR: 69.44 bpm Rate Pressure Product: 18100   Dose of Adenosine (mg):  n/a Dose of Lexiscan: 0.4 mg  Dose of Atropine (mg): n/a Dose of Dobutamine: n/a mcg/kg/min (at max HR)  Stress Test Technologist: Perrin Maltese, EMT-P  Nuclear Technologist:  Earl Many, CNMT     Rest Procedure:  Myocardial perfusion imaging was performed at rest 45 minutes following the intravenous administration of Technetium 23m Sestamibi. Rest ECG: NSR, anterior MI.  Stress Procedure:  The patient received IV  Lexiscan 0.4 mg over 15-seconds.  Technetium 61m Sestamibi injected at 30-seconds. This patient was sob,felt weird, and had chest tightness with the Lexiscan injection. Quantitative spect images were obtained after a 45 minute delay. Stress ECG: No significant ST segment change suggestive of ischemia.  QPS Raw Data Images:  Acquisition technically good; normal left ventricular size. Stress Images:  Normal homogeneous uptake in all areas of the myocardium. Rest Images:  Normal homogeneous uptake in all areas of the myocardium. Subtraction (SDS):  No evidence of ischemia. Transient Ischemic Dilatation (Normal <1.22):  0.90 Lung/Heart Ratio (Normal <0.45):  0.28  Quantitative Gated Spect Images QGS EDV:  48 ml QGS ESV:  09 ml  Impression Exercise Capacity:  Lexiscan with no exercise. BP Response:  Normal blood pressure response. Clinical Symptoms:  There is chest tightness and dyspnea ECG Impression:  No significant ST segment change suggestive of ischemia. Comparison with Prior Nuclear Study: No images to compare  Overall Impression:  Normal stress nuclear study.  LV Ejection Fraction: 81%.  LV Wall Motion:  NL LV Function; NL Wall Motion  [name]

## 2015-02-12 ENCOUNTER — Ambulatory Visit (INDEPENDENT_AMBULATORY_CARE_PROVIDER_SITE_OTHER): Payer: Medicare Other

## 2015-02-12 DIAGNOSIS — J309 Allergic rhinitis, unspecified: Secondary | ICD-10-CM | POA: Diagnosis not present

## 2015-02-19 ENCOUNTER — Ambulatory Visit: Payer: Medicare Other

## 2015-02-27 DIAGNOSIS — H2512 Age-related nuclear cataract, left eye: Secondary | ICD-10-CM | POA: Diagnosis not present

## 2015-02-27 DIAGNOSIS — H25812 Combined forms of age-related cataract, left eye: Secondary | ICD-10-CM | POA: Diagnosis not present

## 2015-03-05 ENCOUNTER — Ambulatory Visit (INDEPENDENT_AMBULATORY_CARE_PROVIDER_SITE_OTHER): Payer: Medicare Other

## 2015-03-05 DIAGNOSIS — J309 Allergic rhinitis, unspecified: Secondary | ICD-10-CM

## 2015-03-12 ENCOUNTER — Ambulatory Visit (INDEPENDENT_AMBULATORY_CARE_PROVIDER_SITE_OTHER): Payer: Medicare Other

## 2015-03-12 DIAGNOSIS — J309 Allergic rhinitis, unspecified: Secondary | ICD-10-CM | POA: Diagnosis not present

## 2015-03-19 ENCOUNTER — Ambulatory Visit (INDEPENDENT_AMBULATORY_CARE_PROVIDER_SITE_OTHER): Payer: Medicare Other

## 2015-03-19 DIAGNOSIS — J309 Allergic rhinitis, unspecified: Secondary | ICD-10-CM | POA: Diagnosis not present

## 2015-03-21 ENCOUNTER — Ambulatory Visit (INDEPENDENT_AMBULATORY_CARE_PROVIDER_SITE_OTHER): Payer: Medicare Other

## 2015-03-21 DIAGNOSIS — J309 Allergic rhinitis, unspecified: Secondary | ICD-10-CM | POA: Diagnosis not present

## 2015-03-25 ENCOUNTER — Other Ambulatory Visit: Payer: Self-pay

## 2015-03-25 MED ORDER — ZOLPIDEM TARTRATE 10 MG PO TABS: 5.0000 mg | ORAL_TABLET | Freq: Every evening | ORAL | Status: DC | PRN

## 2015-03-26 ENCOUNTER — Ambulatory Visit (INDEPENDENT_AMBULATORY_CARE_PROVIDER_SITE_OTHER): Payer: Medicare Other

## 2015-03-26 DIAGNOSIS — J309 Allergic rhinitis, unspecified: Secondary | ICD-10-CM

## 2015-03-28 DIAGNOSIS — Z8262 Family history of osteoporosis: Secondary | ICD-10-CM | POA: Diagnosis not present

## 2015-04-02 ENCOUNTER — Ambulatory Visit (INDEPENDENT_AMBULATORY_CARE_PROVIDER_SITE_OTHER): Payer: Medicare Other

## 2015-04-02 DIAGNOSIS — J309 Allergic rhinitis, unspecified: Secondary | ICD-10-CM | POA: Diagnosis not present

## 2015-04-09 ENCOUNTER — Ambulatory Visit (INDEPENDENT_AMBULATORY_CARE_PROVIDER_SITE_OTHER): Payer: Medicare Other

## 2015-04-09 DIAGNOSIS — J309 Allergic rhinitis, unspecified: Secondary | ICD-10-CM | POA: Diagnosis not present

## 2015-04-10 DIAGNOSIS — H25811 Combined forms of age-related cataract, right eye: Secondary | ICD-10-CM | POA: Diagnosis not present

## 2015-04-10 DIAGNOSIS — H2511 Age-related nuclear cataract, right eye: Secondary | ICD-10-CM | POA: Diagnosis not present

## 2015-04-16 ENCOUNTER — Ambulatory Visit (INDEPENDENT_AMBULATORY_CARE_PROVIDER_SITE_OTHER): Payer: Medicare Other

## 2015-04-16 DIAGNOSIS — J309 Allergic rhinitis, unspecified: Secondary | ICD-10-CM | POA: Diagnosis not present

## 2015-04-21 ENCOUNTER — Other Ambulatory Visit: Payer: Self-pay | Admitting: Cardiovascular Disease

## 2015-04-23 ENCOUNTER — Ambulatory Visit (INDEPENDENT_AMBULATORY_CARE_PROVIDER_SITE_OTHER): Payer: Medicare Other

## 2015-04-23 DIAGNOSIS — J309 Allergic rhinitis, unspecified: Secondary | ICD-10-CM

## 2015-04-24 ENCOUNTER — Encounter: Payer: Self-pay | Admitting: Internal Medicine

## 2015-04-29 ENCOUNTER — Encounter: Payer: Self-pay | Admitting: Internal Medicine

## 2015-04-30 ENCOUNTER — Ambulatory Visit (INDEPENDENT_AMBULATORY_CARE_PROVIDER_SITE_OTHER): Payer: Medicare Other

## 2015-04-30 DIAGNOSIS — J309 Allergic rhinitis, unspecified: Secondary | ICD-10-CM

## 2015-05-07 ENCOUNTER — Ambulatory Visit (INDEPENDENT_AMBULATORY_CARE_PROVIDER_SITE_OTHER): Payer: Medicare Other

## 2015-05-07 DIAGNOSIS — J309 Allergic rhinitis, unspecified: Secondary | ICD-10-CM | POA: Diagnosis not present

## 2015-05-14 ENCOUNTER — Ambulatory Visit (INDEPENDENT_AMBULATORY_CARE_PROVIDER_SITE_OTHER): Payer: Medicare Other

## 2015-05-14 DIAGNOSIS — J309 Allergic rhinitis, unspecified: Secondary | ICD-10-CM | POA: Diagnosis not present

## 2015-05-21 ENCOUNTER — Ambulatory Visit (INDEPENDENT_AMBULATORY_CARE_PROVIDER_SITE_OTHER): Payer: Medicare Other

## 2015-05-21 DIAGNOSIS — J309 Allergic rhinitis, unspecified: Secondary | ICD-10-CM

## 2015-05-28 ENCOUNTER — Ambulatory Visit (INDEPENDENT_AMBULATORY_CARE_PROVIDER_SITE_OTHER): Payer: Medicare Other

## 2015-05-28 DIAGNOSIS — J309 Allergic rhinitis, unspecified: Secondary | ICD-10-CM

## 2015-05-29 ENCOUNTER — Encounter: Payer: Self-pay | Admitting: Internal Medicine

## 2015-05-29 ENCOUNTER — Other Ambulatory Visit (INDEPENDENT_AMBULATORY_CARE_PROVIDER_SITE_OTHER): Payer: Medicare Other

## 2015-05-29 ENCOUNTER — Ambulatory Visit (INDEPENDENT_AMBULATORY_CARE_PROVIDER_SITE_OTHER): Payer: Medicare Other | Admitting: Internal Medicine

## 2015-05-29 VITALS — BP 120/70 | HR 55 | Temp 97.7°F | Ht 65.0 in | Wt 138.5 lb

## 2015-05-29 DIAGNOSIS — E039 Hypothyroidism, unspecified: Secondary | ICD-10-CM

## 2015-05-29 DIAGNOSIS — I208 Other forms of angina pectoris: Secondary | ICD-10-CM | POA: Diagnosis not present

## 2015-05-29 DIAGNOSIS — M810 Age-related osteoporosis without current pathological fracture: Secondary | ICD-10-CM | POA: Diagnosis not present

## 2015-05-29 DIAGNOSIS — E785 Hyperlipidemia, unspecified: Secondary | ICD-10-CM

## 2015-05-29 DIAGNOSIS — Z23 Encounter for immunization: Secondary | ICD-10-CM | POA: Diagnosis not present

## 2015-05-29 DIAGNOSIS — Z0000 Encounter for general adult medical examination without abnormal findings: Secondary | ICD-10-CM

## 2015-05-29 DIAGNOSIS — I350 Nonrheumatic aortic (valve) stenosis: Secondary | ICD-10-CM

## 2015-05-29 LAB — TSH: TSH: 1.55 u[IU]/mL (ref 0.35–4.50)

## 2015-05-29 MED ORDER — LEVOTHYROXINE SODIUM 75 MCG PO TABS: 75.0000 ug | ORAL_TABLET | Freq: Every day | ORAL | Status: DC

## 2015-05-29 MED ORDER — METOPROLOL TARTRATE 25 MG PO TABS: 25.0000 mg | ORAL_TABLET | Freq: Two times a day (BID) | ORAL | Status: DC

## 2015-05-29 MED ORDER — ZOLPIDEM TARTRATE 10 MG PO TABS: 5.0000 mg | ORAL_TABLET | Freq: Every evening | ORAL | Status: DC | PRN

## 2015-05-29 MED ORDER — ATORVASTATIN CALCIUM 20 MG PO TABS: 20.0000 mg | ORAL_TABLET | Freq: Every day | ORAL | Status: DC

## 2015-05-29 MED ORDER — FLUTICASONE PROPIONATE 50 MCG/ACT NA SUSP: 2.0000 | Freq: Every day | NASAL | Status: DC

## 2015-05-29 NOTE — Assessment & Plan Note (Signed)
Unchanged murmur, asymptomatic Last echo reviewed 01/2015: mild-mod unchanged, valve area 1.12 cm2 Follow annually, sooner if symptoms

## 2015-05-29 NOTE — Assessment & Plan Note (Signed)
Lab Results  Component Value Date   TSH 1.94 05/30/2014  recheck TSH and adjust as needed The current medical regimen is effective;  continue present plan and medications.

## 2015-05-29 NOTE — Patient Instructions (Addendum)
It was good to see you today.  We have reviewed your prior records including labs and tests today  Health Maintenance reviewed - Prevnar 13 immunization updated -all other recommended immunizations and age-appropriate screenings are up-to-date.  Test(s) ordered today. Your results will be released to [facility] (or called to you) after review, usually within 72hours after test completion. If any changes need to be made, you will be notified at that same time.  Medications reviewed and updated, no changes recommended at this time. Refill on medication(s) as discussed today.  Please schedule followup in 12 months for annual exam and labs, call sooner if problems.  Health Maintenance Adopting a healthy lifestyle and getting preventive care can go a long way to promote health and wellness. Talk with your health care provider about what schedule of regular examinations is right for you. This is a good chance for you to check in with your provider about disease prevention and staying healthy. In between checkups, there are plenty of things you can do on your own. Experts have done a lot of research about which lifestyle changes and preventive measures are most likely to keep you healthy. Ask your health care provider for more information. WEIGHT AND DIET  Eat a healthy diet  Be sure to include plenty of vegetables, fruits, low-fat dairy products, and lean protein.  Do not eat a lot of foods high in solid fats, added sugars, or salt.  Get regular exercise. This is one of the most important things you can do for your health.  Most adults should exercise for at least 150 minutes each week. The exercise should increase your heart rate and make you sweat (moderate-intensity exercise).  Most adults should also do strengthening exercises at least twice a week. This is in addition to the moderate-intensity exercise.  Maintain a healthy weight  Body mass index (BMI) is a measurement that can be used to  identify possible weight problems. It estimates body fat based on height and weight. Your health care provider can help determine your BMI and help you achieve or maintain a healthy weight.  For females 90 years of age and older:   A BMI below 18.5 is considered underweight.  A BMI of 18.5 to 24.9 is normal.  A BMI of 25 to 29.9 is considered overweight.  A BMI of 30 and above is considered obese.  Watch levels of cholesterol and blood lipids  You should start having your blood tested for lipids and cholesterol at 76 years of age, then have this test every 5 years.  You may need to have your cholesterol levels checked more often if:  Your lipid or cholesterol levels are high.  You are older than 76 years of age.  You are at high risk for heart disease.  CANCER SCREENING   Lung Cancer  Lung cancer screening is recommended for adults 51-35 years old who are at high risk for lung cancer because of a history of smoking.  A yearly low-dose CT scan of the lungs is recommended for people who:  Currently smoke.  Have quit within the past 15 years.  Have at least a 30-pack-year history of smoking. A pack year is smoking an average of one pack of cigarettes a day for 1 year.  Yearly screening should continue until it has been 15 years since you quit.  Yearly screening should stop if you develop a health problem that would prevent you from having lung cancer treatment.  Breast Cancer  Practice  breast self-awareness. This means understanding how your breasts normally appear and feel.  It also means doing regular breast self-exams. Let your health care provider know about any changes, no matter how small.  If you are in your 20s or 30s, you should have a clinical breast exam (CBE) by a health care provider every 1-3 years as part of a regular health exam.  If you are 34 or older, have a CBE every year. Also consider having a breast X-ray (mammogram) every year.  If you have a  family history of breast cancer, talk to your health care provider about genetic screening.  If you are at high risk for breast cancer, talk to your health care provider about having an MRI and a mammogram every year.  Breast cancer gene (BRCA) assessment is recommended for women who have family members with BRCA-related cancers. BRCA-related cancers include:  Breast.  Ovarian.  Tubal.  Peritoneal cancers.  Results of the assessment will determine the need for genetic counseling and BRCA1 and BRCA2 testing. Cervical Cancer Routine pelvic examinations to screen for cervical cancer are no longer recommended for nonpregnant women who are considered low risk for cancer of the pelvic organs (ovaries, uterus, and vagina) and who do not have symptoms. A pelvic examination may be necessary if you have symptoms including those associated with pelvic infections. Ask your health care provider if a screening pelvic exam is right for you.   The Pap test is the screening test for cervical cancer for women who are considered at risk.  If you had a hysterectomy for a problem that was not cancer or a condition that could lead to cancer, then you no longer need Pap tests.  If you are older than 65 years, and you have had normal Pap tests for the past 10 years, you no longer need to have Pap tests.  If you have had past treatment for cervical cancer or a condition that could lead to cancer, you need Pap tests and screening for cancer for at least 20 years after your treatment.  If you no longer get a Pap test, assess your risk factors if they change (such as having a new sexual partner). This can affect whether you should start being screened again.  Some women have medical problems that increase their chance of getting cervical cancer. If this is the case for you, your health care provider may recommend more frequent screening and Pap tests.  The human papillomavirus (HPV) test is another test that may  be used for cervical cancer screening. The HPV test looks for the virus that can cause cell changes in the cervix. The cells collected during the Pap test can be tested for HPV.  The HPV test can be used to screen women 57 years of age and older. Getting tested for HPV can extend the interval between normal Pap tests from three to five years.  An HPV test also should be used to screen women of any age who have unclear Pap test results.  After 76 years of age, women should have HPV testing as often as Pap tests.  Colorectal Cancer  This type of cancer can be detected and often prevented.  Routine colorectal cancer screening usually begins at 76 years of age and continues through 76 years of age.  Your health care provider may recommend screening at an earlier age if you have risk factors for colon cancer.  Your health care provider may also recommend using home test kits to  check for hidden blood in the stool.  A small camera at the end of a tube can be used to examine your colon directly (sigmoidoscopy or colonoscopy). This is done to check for the earliest forms of colorectal cancer.  Routine screening usually begins at age 13.  Direct examination of the colon should be repeated every 5-10 years through 76 years of age. However, you may need to be screened more often if early forms of precancerous polyps or small growths are found. Skin Cancer  Check your skin from head to toe regularly.  Tell your health care provider about any new moles or changes in moles, especially if there is a change in a mole's shape or color.  Also tell your health care provider if you have a mole that is larger than the size of a pencil eraser.  Always use sunscreen. Apply sunscreen liberally and repeatedly throughout the day.  Protect yourself by wearing long sleeves, pants, a wide-brimmed hat, and sunglasses whenever you are outside. HEART DISEASE, DIABETES, AND HIGH BLOOD PRESSURE   Have your blood  pressure checked at least every 1-2 years. High blood pressure causes heart disease and increases the risk of stroke.  If you are between 19 years and 24 years old, ask your health care provider if you should take aspirin to prevent strokes.  Have regular diabetes screenings. This involves taking a blood sample to check your fasting blood sugar level.  If you are at a normal weight and have a low risk for diabetes, have this test once every three years after 76 years of age.  If you are overweight and have a high risk for diabetes, consider being tested at a younger age or more often. PREVENTING INFECTION  Hepatitis B  If you have a higher risk for hepatitis B, you should be screened for this virus. You are considered at high risk for hepatitis B if:  You were born in a country where hepatitis B is common. Ask your health care provider which countries are considered high risk.  Your parents were born in a high-risk country, and you have not been immunized against hepatitis B (hepatitis B vaccine).  You have HIV or AIDS.  You use needles to inject street drugs.  You live with someone who has hepatitis B.  You have had sex with someone who has hepatitis B.  You get hemodialysis treatment.  You take certain medicines for conditions, including cancer, organ transplantation, and autoimmune conditions. Hepatitis C  Blood testing is recommended for:  Everyone born from [birth year] through 1967.  Anyone with known risk factors for hepatitis C. Sexually transmitted infections (STIs)  You should be screened for sexually transmitted infections (STIs) including gonorrhea and chlamydia if:  You are sexually active and are younger than 76 years of age.  You are older than 75 years of age and your health care provider tells you that you are at risk for this type of infection.  Your sexual activity has changed since you were last screened and you are at an increased risk for chlamydia or  gonorrhea. Ask your health care provider if you are at risk.  If you do not have HIV, but are at risk, it may be recommended that you take a prescription medicine daily to prevent HIV infection. This is called pre-exposure prophylaxis (PrEP). You are considered at risk if:  You are sexually active and do not regularly use condoms or know the HIV status of your partner(s).  You take  drugs by injection.  You are sexually active with a partner who has HIV. Talk with your health care provider about whether you are at high risk of being infected with HIV. If you choose to begin PrEP, you should first be tested for HIV. You should then be tested every 3 months for as long as you are taking PrEP.  PREGNANCY   If you are premenopausal and you may become pregnant, ask your health care provider about preconception counseling.  If you may become pregnant, take 400 to 800 micrograms (mcg) of folic acid every day.  If you want to prevent pregnancy, talk to your health care provider about birth control (contraception). OSTEOPOROSIS AND MENOPAUSE   Osteoporosis is a disease in which the bones lose minerals and strength with aging. This can result in serious bone fractures. Your risk for osteoporosis can be identified using a bone density scan.  If you are 67 years of age or older, or if you are at risk for osteoporosis and fractures, ask your health care provider if you should be screened.  Ask your health care provider whether you should take a calcium or vitamin D supplement to lower your risk for osteoporosis.  Menopause may have certain physical symptoms and risks.  Hormone replacement therapy may reduce some of these symptoms and risks. Talk to your health care provider about whether hormone replacement therapy is right for you.  HOME CARE INSTRUCTIONS   Schedule regular health, dental, and eye exams.  Stay current with your immunizations.   Do not use any tobacco products including  cigarettes, chewing tobacco, or electronic cigarettes.  If you are pregnant, do not drink alcohol.  If you are breastfeeding, limit how much and how often you drink alcohol.  Limit alcohol intake to no more than 1 drink per day for nonpregnant women. One drink equals 12 ounces of beer, 5 ounces of wine, or 1 ounces of hard liquor.  Do not use street drugs.  Do not share needles.  Ask your health care provider for help if you need support or information about quitting drugs.  Tell your health care provider if you often feel depressed.  Tell your health care provider if you have ever been abused or do not feel safe at home. Document Released: 05/17/2011 Document Revised: 03/18/2014 Document Reviewed: 10/03/2013 [facility] Patient Information 2015 [place], [place]. This information is not intended to replace advice given to you by your health care provider. Make sure you discuss any questions you have with your health care provider.

## 2015-05-29 NOTE — Assessment & Plan Note (Signed)
On statin for CAD hx Levels monitored by cards -reviewed

## 2015-05-29 NOTE — Progress Notes (Signed)
Pre visit review using our clinic review tool, if applicable. No additional management support is needed unless otherwise documented below in the visit note. 

## 2015-05-29 NOTE — Progress Notes (Signed)
Subjective:    Patient ID: [name], female    DOB: 1941/04/19, 74 y.o.   MRN: [id number]  HPI   Here for annual/medicare wellness  Diet: heart healthy  Physical activity: sedentary Depression/mood screen: negative Hearing: intact to whispered voice Visual acuity: grossly normal, performs annual eye exam  ADLs: capable Fall risk: none Home safety: good Cognitive evaluation: intact to orientation, naming, recall and repetition EOL planning: adv directives, full code/ I agree  I have personally reviewed and have noted 1. The patient's medical and social history 2. Their use of alcohol, tobacco or illicit drugs 3. Their current medications and supplements 4. The patient's functional ability including ADL's, fall risks, home safety risks and hearing or visual impairment. 5. Diet and physical activities 6. Evidence for depression or mood disorders  Also reviewed chronic medical conditions, interval events and current concerns  Past Medical History  Diagnosis Date  . Hypertension   . Osteopenia   . Aortic stenosis, moderate   . History of DVT (deep vein thrombosis)   . Coronary artery disease     s/p CABGx5 2007  . ALLERGIC RHINITIS   . BREAST CANCER 1991    left, 1991; B mastectomy  . CORONARY ARTERY DISEASE   . DIVERTICULOSIS   . DVT   . EROSIVE GASTRITIS   . GERD   . HYPERLIPIDEMIA   . HYPOTHYROIDISM   . Occlusion and stenosis of carotid artery without mention of cerebral infarction   . OSTEOPOROSIS   . RHINOSINUSITIS, RECURRENT    Family History  Problem Relation Age of Onset  . Colon cancer Sister   . Colon cancer Sister   . Breast cancer Mother   . Lung disease Mother   . Emphysema Father    History  Substance Use Topics  . Smoking status: Never Smoker   . Smokeless tobacco: Never Used  . Alcohol Use: No    Review of Systems  Constitutional: Negative for fatigue and unexpected weight change.  Respiratory: Negative for cough, shortness of  breath and wheezing.   Cardiovascular: Negative for chest pain, palpitations and leg swelling.  Gastrointestinal: Negative for nausea, abdominal pain and diarrhea.  Genitourinary: Positive for urgency (a/w incont, especially with position change). Negative for decreased urine volume and pelvic pain.  Neurological: Negative for dizziness, weakness, light-headedness and headaches.  Psychiatric/Behavioral: Negative for dysphoric mood. The patient is not nervous/anxious.   All other systems reviewed and are negative.   Patient Care Team: Rowe Clack, MD as PCP - General [name], MD as Consulting Physician (Cardiology) Deneise Lever, MD as Consulting Physician (Pulmonary Disease) Irene Shipper, MD as Consulting Physician (Gastroenterology) Danella Sensing, MD as Consulting Physician (Dermatology) Loletta Specter, MD (Plastic Surgery) [name], MD (Obstetrics and Gynecology)     Objective:    Physical Exam  Constitutional: She appears well-developed and well-nourished. No distress.  Cardiovascular: Normal rate, regular rhythm and normal heart sounds.   No murmur heard. Pulmonary/Chest: Effort normal and breath sounds normal. No respiratory distress.  Musculoskeletal: She exhibits no edema.    BP 120/70 mmHg  Pulse 55  Temp(Src) 97.7 F (36.5 C) (Oral)  Ht 5\' 5"  (1.651 m)  Wt 138 lb 8 oz (62.823 kg)  BMI 23.05 kg/m2  SpO2 95% Wt Readings from Last 3 Encounters:  05/29/15 138 lb 8 oz (62.823 kg)  02/10/15 134 lb (60.782 kg)  02/03/15 137 lb 6.4 oz (62.324 kg)     Lab Results  Component  Value Date   WBC 6.2 05/30/2014   HGB 14.1 05/30/2014   HCT 42.6 05/30/2014   PLT 276.0 05/30/2014   GLUCOSE 93 05/30/2014   CHOL 133 01/30/2015   TRIG 62.0 01/30/2015   HDL 45.80 01/30/2015   LDLCALC 75 01/30/2015   ALT 19 01/30/2015   AST 24 01/30/2015   NA 139 05/30/2014   K 4.3 05/30/2014   CL 106 05/30/2014   CREATININE 0.7 05/30/2014   BUN 19 05/30/2014     CO2 31 05/30/2014   TSH 1.94 05/30/2014   INR 1.0 ratio 10/26/2010    Dg Hip Complete Left  03/29/2012   *RADIOLOGY REPORT*  Clinical Data: Fall 10 days ago.  Left groin pain.  LEFT HIP - COMPLETE 2+ VIEW  Comparison: No comparison studies available.  Findings: No evidence for fracture.  No subluxation.  SI joints and symphysis pubis are unremarkable.  Arcuate lines of the sacrum are preserved.  IMPRESSION: No acute bony findings.  No evidence to explain the patient's history of left groin pain.  Original Report Authenticated By: ERIC A. MANSELL, M.D.      Assessment & Plan:   AWV/z00.00 - Today patient counseled on age appropriate routine health concerns for screening and prevention, each reviewed and up to date or declined. Immunizations reviewed and up to date or declined. Labs ordered and reviewed. Risk factors for depression reviewed and negative. Hearing function and visual acuity are intact. ADLs screened and addressed as needed. Functional ability and level of safety reviewed and appropriate. Education, counseling and referrals performed based on assessed risks today. Patient provided with a copy of personalized plan for preventive services.  Urinary incontinence. Denies other symptoms of stress incontinence. Prior bladder sling procedure with hysterectomy remotely reviewed. Patient has seen gynecologist for same and is working on exercises for pelvic wall strengthening. Declines need for urology or other evaluation at this time  Problem List Items Addressed This Visit    Aortic stenosis, moderate    Unchanged murmur, asymptomatic Last echo reviewed 01/2015: mild-mod unchanged, valve area 1.12 cm2 Follow annually, sooner if symptoms       Relevant Medications   atorvastatin (LIPITOR) 20 MG tablet   metoprolol tartrate (LOPRESSOR) 25 MG tablet   Hyperlipidemia    On statin for CAD hx Levels monitored by cards -reviewed      Relevant Medications   atorvastatin (LIPITOR) 20 MG  tablet   metoprolol tartrate (LOPRESSOR) 25 MG tablet   Hypothyroidism    Lab Results  Component Value Date   TSH 1.94 05/30/2014  recheck TSH and adjust as needed The current medical regimen is effective;  continue present plan and medications.       Relevant Medications   levothyroxine (SYNTHROID, LEVOTHROID) 75 MCG tablet   metoprolol tartrate (LOPRESSOR) 25 MG tablet   Other Relevant Orders   TSH   Senile osteoporosis    Hx fracture - ankle and foot with accidental trauma (remote) DEXA 03/2013: -2.7; progressive loss 03/2015: -3.7 On fosamax for years, stopped in 2005 Consider Prolia - pt will research and let us know if she agrees to same continue Ca 1200 mg/d, Vit D + WB exercise       Other Visit Diagnoses    Routine general medical examination at a health care facility    -  Primary    Need for prophylactic vaccination against Streptococcus pneumoniae (pneumococcus)        Relevant Orders    Pneumococcal conjugate vaccine 13-valent (Completed)  Gwendolyn Grant, MD

## 2015-05-29 NOTE — Assessment & Plan Note (Signed)
Hx fracture - ankle and foot with accidental trauma (remote) DEXA 03/2013: -2.7; progressive loss 03/2015: -3.7 On fosamax for years, stopped in 2005 Consider Prolia - pt will research and let us know if she agrees to same continue Ca 1200 mg/d, Vit D + WB exercise

## 2015-06-03 ENCOUNTER — Telehealth: Payer: Self-pay | Admitting: Internal Medicine

## 2015-06-03 NOTE — Telephone Encounter (Signed)
Patient states she had a pneumonia vac on Thursday 7/14.  Patient states she broke out in a rash the next day on her arm.  Patient states rash has almost cleared up and there is no burning or itching.  Patient would also like to know if she could take calcium D3.  Please follow up with.

## 2015-06-03 NOTE — Telephone Encounter (Signed)
Spoke to pt. Added rash to allergies.  What is the recommendation for calcium and vitamin d?

## 2015-06-04 ENCOUNTER — Ambulatory Visit (INDEPENDENT_AMBULATORY_CARE_PROVIDER_SITE_OTHER): Payer: Medicare Other

## 2015-06-04 DIAGNOSIS — J309 Allergic rhinitis, unspecified: Secondary | ICD-10-CM

## 2015-06-06 NOTE — Telephone Encounter (Signed)
Ok to add allergy - thanks Ca 1200mg /d and Vit D3 2000 U/day (both OTC) thanks

## 2015-06-09 NOTE — Telephone Encounter (Signed)
Pt informed

## 2015-06-11 ENCOUNTER — Ambulatory Visit (INDEPENDENT_AMBULATORY_CARE_PROVIDER_SITE_OTHER): Payer: Medicare Other

## 2015-06-11 DIAGNOSIS — J309 Allergic rhinitis, unspecified: Secondary | ICD-10-CM | POA: Diagnosis not present

## 2015-06-18 ENCOUNTER — Ambulatory Visit (INDEPENDENT_AMBULATORY_CARE_PROVIDER_SITE_OTHER): Payer: Medicare Other

## 2015-06-18 DIAGNOSIS — J309 Allergic rhinitis, unspecified: Secondary | ICD-10-CM | POA: Diagnosis not present

## 2015-06-25 ENCOUNTER — Ambulatory Visit (INDEPENDENT_AMBULATORY_CARE_PROVIDER_SITE_OTHER): Payer: Medicare Other

## 2015-06-25 DIAGNOSIS — J309 Allergic rhinitis, unspecified: Secondary | ICD-10-CM | POA: Diagnosis not present

## 2015-07-02 ENCOUNTER — Ambulatory Visit (INDEPENDENT_AMBULATORY_CARE_PROVIDER_SITE_OTHER): Payer: Medicare Other

## 2015-07-02 DIAGNOSIS — J309 Allergic rhinitis, unspecified: Secondary | ICD-10-CM

## 2015-07-07 ENCOUNTER — Telehealth: Payer: Self-pay | Admitting: *Deleted

## 2015-07-07 NOTE — Telephone Encounter (Signed)
Requesting to speak with [name] concerning her medications...[name]

## 2015-07-08 ENCOUNTER — Encounter: Payer: Self-pay | Admitting: Internal Medicine

## 2015-07-08 NOTE — Telephone Encounter (Signed)
Pt has noticed that PCP rx cardiac meds and is requesting to stop the [facility]ills [facility].   [facility] contacted and request to stop Lopressor and Lipitor has been completed.

## 2015-07-09 ENCOUNTER — Ambulatory Visit (INDEPENDENT_AMBULATORY_CARE_PROVIDER_SITE_OTHER): Payer: Medicare Other

## 2015-07-09 DIAGNOSIS — J309 Allergic rhinitis, unspecified: Secondary | ICD-10-CM

## 2015-07-16 ENCOUNTER — Ambulatory Visit (INDEPENDENT_AMBULATORY_CARE_PROVIDER_SITE_OTHER): Payer: Medicare Other

## 2015-07-16 DIAGNOSIS — J309 Allergic rhinitis, unspecified: Secondary | ICD-10-CM | POA: Diagnosis not present

## 2015-07-17 ENCOUNTER — Telehealth: Payer: Self-pay | Admitting: Internal Medicine

## 2015-07-17 NOTE — Telephone Encounter (Signed)
Pt returned call [phone]

## 2015-07-17 NOTE — Telephone Encounter (Signed)
Called spoke with pt. She is requesting to be seen today by Dr. Annamaria Boots. She reports she feels really bad, coughing up a lot of clear phlem, having a lot of PND, sore throat. Feels like something going on in chest. Please advise Dr. Annamaria Boots thanks  Allergies  Allergen Reactions  . Clarithromycin Other (See Comments)    PATIENT UNSURE OF REACTION  . Codeine     REACTION: nausea  . Levofloxacin     REACTION: nausea  . Morphine   . Naproxen     REACTION: nausea  . Penicillins     REACTION: RASH  . Pneumococcal Vaccines Rash    Small rash on arm at injection site.     Current Outpatient Prescriptions on File Prior to Visit  Medication Sig Dispense Refill  . aspirin 81 MG tablet Take 81 mg by mouth daily.      Marland Kitchen atorvastatin (LIPITOR) 20 MG tablet Take 1 tablet (20 mg total) by mouth daily. 90 tablet 3  . Calcium Carbonate (CALTRATE 600 PO) Take 2 tablets by mouth daily.      . Coenzyme Q10 (CO Q-10 PO) Take 50 mg by mouth daily.      . fluticasone (FLONASE) 50 MCG/ACT nasal spray Place 2 sprays into both nostrils daily. 16 g 1  . levothyroxine (SYNTHROID, LEVOTHROID) 75 MCG tablet Take 1 tablet (75 mcg total) by mouth daily before breakfast. 90 tablet 3  . meloxicam (MOBIC) 15 MG tablet Take 1 tablet (15 mg total) by mouth daily. 90 tablet 1  . metoprolol tartrate (LOPRESSOR) 25 MG tablet Take 1 tablet (25 mg total) by mouth 2 (two) times daily. 180 tablet 3  . multivitamin (THERAGRAN) per tablet Take 1 tablet by mouth daily.      . NONFORMULARY OR COMPOUNDED ITEM Allergy Vaccine 1:10 Given at [facility]    . senna-docusate (SENOKOT-S) 8.6-50 MG per tablet Take 1 tablet by mouth daily.      Marland Kitchen zolpidem (AMBIEN) 10 MG tablet Take 0.5 tablets (5 mg total) by mouth at bedtime as needed. 45 tablet 1   No current facility-administered medications on file prior to visit.

## 2015-07-17 NOTE — Telephone Encounter (Signed)
Pt returned call [phone]

## 2015-07-17 NOTE — Telephone Encounter (Signed)
[id number] x1 

## 2015-07-17 NOTE — Telephone Encounter (Signed)
Spoke with pt, offered an appt with TP, pt declined.  Offered pt the below abx, pt declined, saying she will be fine.  Pt wants to schedule an annual rov with CY, which was why she called in the first place this morning.   Pt scheduled for next available for rov which is in January.  Nothing further needed at this time.

## 2015-07-17 NOTE — Telephone Encounter (Signed)
If I don't have a work in slot today, then offer doxycycline 100 mg, # 8, 2 today then one daily

## 2015-07-23 ENCOUNTER — Ambulatory Visit (INDEPENDENT_AMBULATORY_CARE_PROVIDER_SITE_OTHER): Payer: Medicare Other

## 2015-07-23 ENCOUNTER — Telehealth: Payer: Self-pay | Admitting: Internal Medicine

## 2015-07-23 DIAGNOSIS — J309 Allergic rhinitis, unspecified: Secondary | ICD-10-CM

## 2015-07-23 NOTE — Telephone Encounter (Signed)
Date Mixed: 07/23/15 Vial: 2 Strength: 1:10 Here/Mail/Pick Up: here Mixed By: tbs

## 2015-07-30 ENCOUNTER — Ambulatory Visit (INDEPENDENT_AMBULATORY_CARE_PROVIDER_SITE_OTHER): Payer: Medicare Other

## 2015-07-30 DIAGNOSIS — J309 Allergic rhinitis, unspecified: Secondary | ICD-10-CM

## 2015-07-30 DIAGNOSIS — Z23 Encounter for immunization: Secondary | ICD-10-CM | POA: Diagnosis not present

## 2015-08-04 ENCOUNTER — Ambulatory Visit: Payer: Medicare Other | Admitting: Physician Assistant

## 2015-08-06 ENCOUNTER — Ambulatory Visit (INDEPENDENT_AMBULATORY_CARE_PROVIDER_SITE_OTHER): Payer: Medicare Other

## 2015-08-06 DIAGNOSIS — J309 Allergic rhinitis, unspecified: Secondary | ICD-10-CM

## 2015-08-13 ENCOUNTER — Ambulatory Visit (INDEPENDENT_AMBULATORY_CARE_PROVIDER_SITE_OTHER): Payer: Medicare Other

## 2015-08-13 DIAGNOSIS — J309 Allergic rhinitis, unspecified: Secondary | ICD-10-CM | POA: Diagnosis not present

## 2015-08-18 DIAGNOSIS — H02204 Unspecified lagophthalmos left upper eyelid: Secondary | ICD-10-CM | POA: Diagnosis not present

## 2015-08-18 DIAGNOSIS — H02201 Unspecified lagophthalmos right upper eyelid: Secondary | ICD-10-CM | POA: Diagnosis not present

## 2015-08-18 DIAGNOSIS — H26493 Other secondary cataract, bilateral: Secondary | ICD-10-CM | POA: Diagnosis not present

## 2015-08-18 DIAGNOSIS — E0500 Thyrotoxicosis with diffuse goiter without thyrotoxic crisis or storm: Secondary | ICD-10-CM | POA: Diagnosis not present

## 2015-08-20 ENCOUNTER — Ambulatory Visit (INDEPENDENT_AMBULATORY_CARE_PROVIDER_SITE_OTHER): Payer: Medicare Other

## 2015-08-20 DIAGNOSIS — J309 Allergic rhinitis, unspecified: Secondary | ICD-10-CM

## 2015-08-25 DIAGNOSIS — L570 Actinic keratosis: Secondary | ICD-10-CM | POA: Diagnosis not present

## 2015-08-25 DIAGNOSIS — Z85828 Personal history of other malignant neoplasm of skin: Secondary | ICD-10-CM | POA: Diagnosis not present

## 2015-08-25 DIAGNOSIS — L821 Other seborrheic keratosis: Secondary | ICD-10-CM | POA: Diagnosis not present

## 2015-08-25 DIAGNOSIS — D1801 Hemangioma of skin and subcutaneous tissue: Secondary | ICD-10-CM | POA: Diagnosis not present

## 2015-08-27 ENCOUNTER — Ambulatory Visit (INDEPENDENT_AMBULATORY_CARE_PROVIDER_SITE_OTHER): Payer: Medicare Other

## 2015-08-27 DIAGNOSIS — J309 Allergic rhinitis, unspecified: Secondary | ICD-10-CM | POA: Diagnosis not present

## 2015-08-28 DIAGNOSIS — H26491 Other secondary cataract, right eye: Secondary | ICD-10-CM | POA: Diagnosis not present

## 2015-08-28 DIAGNOSIS — H2640 Unspecified secondary cataract: Secondary | ICD-10-CM | POA: Diagnosis not present

## 2015-09-03 ENCOUNTER — Ambulatory Visit (INDEPENDENT_AMBULATORY_CARE_PROVIDER_SITE_OTHER): Payer: Medicare Other

## 2015-09-03 DIAGNOSIS — J309 Allergic rhinitis, unspecified: Secondary | ICD-10-CM

## 2015-09-04 DIAGNOSIS — H26492 Other secondary cataract, left eye: Secondary | ICD-10-CM | POA: Diagnosis not present

## 2015-09-04 DIAGNOSIS — H2640 Unspecified secondary cataract: Secondary | ICD-10-CM | POA: Diagnosis not present

## 2015-09-05 ENCOUNTER — Ambulatory Visit (INDEPENDENT_AMBULATORY_CARE_PROVIDER_SITE_OTHER): Payer: Medicare Other | Admitting: Cardiovascular Disease

## 2015-09-05 ENCOUNTER — Encounter: Payer: Self-pay | Admitting: Cardiovascular Disease

## 2015-09-05 VITALS — BP 102/60 | HR 61 | Ht 65.5 in | Wt 142.8 lb

## 2015-09-05 DIAGNOSIS — I208 Other forms of angina pectoris: Secondary | ICD-10-CM

## 2015-09-05 DIAGNOSIS — I2510 Atherosclerotic heart disease of native coronary artery without angina pectoris: Secondary | ICD-10-CM | POA: Diagnosis not present

## 2015-09-05 DIAGNOSIS — E785 Hyperlipidemia, unspecified: Secondary | ICD-10-CM | POA: Diagnosis not present

## 2015-09-05 DIAGNOSIS — I350 Nonrheumatic aortic (valve) stenosis: Secondary | ICD-10-CM

## 2015-09-05 NOTE — Patient Instructions (Signed)
Medication Instructions:  Your physician recommends that you continue on your current medications as directed. Please refer to the Current Medication list given to you today.  Labwork: Your physician recommends that you return for a FASTING LIPID and LIVER in 6 MONTHS--nothing to eat or drink after midnight, lab opens at 7:30 AM  Testing/Procedures: Your physician has requested that you have an echocardiogram in 6 MONTHS. Echocardiography is a painless test that uses sound waves to create images of your heart. It provides your doctor with information about the size and shape of your heart and how well your heart's chambers and valves are working. This procedure takes approximately one hour. There are no restrictions for this procedure.  Follow-Up: Your physician wants you to follow-up in: 6 MONTHS with Dr Burt Knack.  You will receive a reminder letter in the mail two months in advance. If you don't receive a letter, please call our office to schedule the follow-up appointment.   Any Other Special Instructions Will Be Listed Below (If Applicable).

## 2015-09-05 NOTE — Progress Notes (Signed)
Cardiology Office Note Date:  09/07/2015   ID:  Ashleymarie, Granderson 21-Oct-1941, MRN [id number]  PCP:  Gwendolyn Grant, MD  Cardiologist:  [name], MD    Chief Complaint  Patient presents with  . Coronary Artery Disease   History of Present Illness: [name] is a 74 y.o. female who presents for follow-up evaluation. The patient has coronary artery disease with history of CABG in 2007. She underwent cardiac catheterization in 2011 demonstrating patency of all of her bypass grafts and preserved LV function. Last Myoview scan in March 2016 showed no ischemia.   She is doing well. Has no complaints today. Specifically denies chest pain or pressure, dyspnea, edema, or heart palpitations. She is active and exercises regularly with a 1 mile walk.   Past Medical History  Diagnosis Date  . Hypertension   . Osteopenia   . Aortic stenosis, moderate   . History of DVT (deep vein thrombosis)   . Coronary artery disease     s/p CABGx5 2007  . ALLERGIC RHINITIS   . BREAST CANCER 1991    left, 1991; B mastectomy  . CORONARY ARTERY DISEASE   . DIVERTICULOSIS   . DVT   . EROSIVE GASTRITIS   . GERD   . HYPERLIPIDEMIA   . HYPOTHYROIDISM   . Occlusion and stenosis of carotid artery without mention of cerebral infarction   . OSTEOPOROSIS   . RHINOSINUSITIS, RECURRENT     Past Surgical History  Procedure Laterality Date  . Colonoscopy    . Upper gastrointestinal endoscopy    . Mastectomy      left with reconstruction and lymph node excision  1992  . Mastectomy      right with reconstruction  . Tonsillectomy and adenoidectomy    . Appendectomy    . Vaginal hysterectomy      ovaries not excised  . Coronary artery bypass graft      5        2007  . Right ankle arthroscopy    . Revision reconstructed breast Left 02/2014    [facility] (plastics in [place])    Current Outpatient Prescriptions  Medication Sig Dispense Refill  . aspirin 81 MG tablet Take 81 mg by mouth daily.        Marland Kitchen atorvastatin (LIPITOR) 20 MG tablet Take 1 tablet (20 mg total) by mouth daily. 90 tablet 3  . Calcium Carbonate (CALTRATE 600 PO) Take 2 tablets by mouth daily.      . Coenzyme Q10 (CO Q-10 PO) Take 50 mg by mouth daily.      . fluticasone (FLONASE) 50 MCG/ACT nasal spray Place 2 sprays into both nostrils daily. 16 g 1  . levothyroxine (SYNTHROID, LEVOTHROID) 75 MCG tablet Take 1 tablet (75 mcg total) by mouth daily before breakfast. 90 tablet 3  . meloxicam (MOBIC) 15 MG tablet Take 1 tablet (15 mg total) by mouth daily. 90 tablet 1  . metoprolol tartrate (LOPRESSOR) 25 MG tablet Take 1 tablet (25 mg total) by mouth 2 (two) times daily. 180 tablet 3  . multivitamin (THERAGRAN) per tablet Take 1 tablet by mouth daily.      . NONFORMULARY OR COMPOUNDED ITEM Allergy Vaccine 1:10 Given at [facility]    . senna-docusate (SENOKOT-S) 8.6-50 MG per tablet Take 1 tablet by mouth daily.      Marland Kitchen zolpidem (AMBIEN) 10 MG tablet Take 10 mg by mouth at bedtime as needed for sleep.     No current  facility-administered medications for this visit.    Allergies:   Clarithromycin; Codeine; Levofloxacin; Morphine; Naproxen; Penicillins; and Pneumococcal vaccines   Social History:  The patient  reports that she has never smoked. She has never used smokeless tobacco. She reports that she does not drink alcohol or use illicit drugs.   Family History:  The patient's  family history includes Breast cancer in her mother; Colon cancer in her sister and sister; Emphysema in her father; Lung disease in her mother.    ROS:  Please see the history of present illness.  Otherwise, review of systems is positive for hearing loss, visual disturbance, easy bruising, balance problems.  All other systems are reviewed and negative.    PHYSICAL EXAM: VS:  BP 102/60 mmHg  Pulse 61  Ht 5' 5.5" (1.664 m)  Wt 142 lb 12.8 oz (64.774 kg)  BMI 23.39 kg/m2 , BMI Body mass index is 23.39 kg/(m^2). GEN: Well nourished,  well developed, in no acute distress HEENT: normal Neck: no JVD, no masses. Bilateral bruits Cardiac: RRR with 2/6 harsh systolic murmur at the RSB     Respiratory:  clear to auscultation bilaterally, normal work of breathing GI: soft, nontender, nondistended, + BS MS: no deformity or atrophy Ext: no pretibial edema, pedal pulses 2+= bilaterally Skin: warm and dry, no rash Neuro:  Strength and sensation are intact Psych: euthymic mood, full affect  EKG:  EKG is ordered today. The ekg ordered today shows NSR 61 bpm, possible age-indeterminate inferior MI, possible anterior MI age undetermined  Recent Labs: 01/30/2015: ALT 19 05/29/2015: TSH 1.55   Lipid Panel     Component Value Date/Time   CHOL 133 01/30/2015 1036   TRIG 62.0 01/30/2015 1036   HDL 45.80 01/30/2015 1036   CHOLHDL 3 01/30/2015 1036   VLDL 12.4 01/30/2015 1036   LDLCALC 75 01/30/2015 1036      Wt Readings from Last 3 Encounters:  09/05/15 142 lb 12.8 oz (64.774 kg)  05/29/15 138 lb 8 oz (62.823 kg)  02/10/15 134 lb (60.782 kg)     Cardiac Studies Reviewed: 2D Echo 02/07/2015: Study Conclusions  - Left ventricle: The cavity size was normal. There was mild focal basal hypertrophy of the septum. Systolic function was normal. The estimated ejection fraction was in the range of 60% to 65%. Although no diagnostic regional wall motion abnormality was identified, this possibility cannot be completely excluded on the basis of this study. Features are consistent with a pseudonormal left ventricular filling pattern, with concomitant abnormal relaxation and increased filling pressure (grade 2 diastolic dysfunction). E/medial e&' > 15 suggests LV end diastolic pressure at least 20 mmHg. - Aortic valve: Bicuspid; moderately calcified leaflets. There was mild to moderate stenosis. Mean gradient (S): 10 mm Hg. Peak gradient (S): 21 mm Hg. Valve area (VTI): 1.17 cm^2. - Aortic root: The aortic  root was normal in size. - Mitral valve: Mildly calcified annulus. Mildly calcified leaflets . There was trivial regurgitation. - Left atrium: The atrium was mildly dilated. - Right ventricle: The cavity size was normal. Systolic function was normal. - Tricuspid valve: Peak RV-RA gradient (S): 29 mm Hg. - Pulmonary arteries: PA peak pressure: 32 mm Hg (S). - Inferior vena cava: The vessel was normal in size. The respirophasic diameter changes were in the normal range (= 50%), consistent with normal central venous pressure.  Impressions:  - Normal LV size with mild focal basal septal hypertrophy. Moderate diastolic dysfunction. EF 60-65%. Normal RV size and systolic function.  The aortic valve was bicuspid with mild to moderate stenosis (mild by mean gradient, moderate by calculated valve area).  Carotid Duplex 02/07/2015: <40% carotid stenosis bilaterally 2 year follow-up recommend  Myoview Scan 02/10/2015: Impression Exercise Capacity: Lexiscan with no exercise. BP Response: Normal blood pressure response. Clinical Symptoms: There is chest tightness and dyspnea ECG Impression: No significant ST segment change suggestive of ischemia. Comparison with Prior Nuclear Study: No images to compare  Overall Impression: Normal stress nuclear study.  LV Ejection Fraction: 81%. LV Wall Motion: NL LV Function; NL Wall Motion  ASSESSMENT AND PLAN: 1.  CAD, native vessel, no angina. Most recent nuclear scan reviewed and shows no ischemia. Pt is stable. Reviewed meds today and lifestyle issues for secondary prevention. Will see back in 6 months.  2. Aortic stenosis: Echo from March reviewed with 'mild to moderate' AS. Will repeat an echo in 6 months at the time of her return office visit.   3. HTN, essential: controlled  4. Hyperlipidemia: controlled on current Rx with atorvastatin. Repeat lipids in 6 months.  5. Carotid stenosis: mild by most recent duplex. 2 year FU  recommended.  Current medicines are reviewed with the patient today.  The patient does not have concerns regarding medicines.  Labs/ tests ordered today include:   Orders Placed This Encounter  Procedures  . Lipid panel  . Hepatic function panel  . EKG 12-Lead  . Echocardiogram    Disposition:   FU 6 months  Signed, [name], MD  09/07/2015 2:09 PM    [facility] [address], [place], [place]  [address] Phone: [phone]; Fax: [phone]

## 2015-09-09 ENCOUNTER — Ambulatory Visit: Payer: Medicare Other | Admitting: Internal Medicine

## 2015-09-10 ENCOUNTER — Ambulatory Visit (INDEPENDENT_AMBULATORY_CARE_PROVIDER_SITE_OTHER): Payer: Medicare Other

## 2015-09-10 DIAGNOSIS — J309 Allergic rhinitis, unspecified: Secondary | ICD-10-CM | POA: Diagnosis not present

## 2015-09-17 ENCOUNTER — Ambulatory Visit (INDEPENDENT_AMBULATORY_CARE_PROVIDER_SITE_OTHER): Payer: Medicare Other

## 2015-09-17 DIAGNOSIS — J309 Allergic rhinitis, unspecified: Secondary | ICD-10-CM

## 2015-09-22 ENCOUNTER — Encounter: Payer: Self-pay | Admitting: Internal Medicine

## 2015-09-24 ENCOUNTER — Ambulatory Visit (INDEPENDENT_AMBULATORY_CARE_PROVIDER_SITE_OTHER): Payer: Medicare Other

## 2015-09-24 DIAGNOSIS — J309 Allergic rhinitis, unspecified: Secondary | ICD-10-CM | POA: Diagnosis not present

## 2015-10-01 ENCOUNTER — Ambulatory Visit (INDEPENDENT_AMBULATORY_CARE_PROVIDER_SITE_OTHER): Payer: Medicare Other

## 2015-10-01 DIAGNOSIS — J309 Allergic rhinitis, unspecified: Secondary | ICD-10-CM | POA: Diagnosis not present

## 2015-10-08 ENCOUNTER — Ambulatory Visit (INDEPENDENT_AMBULATORY_CARE_PROVIDER_SITE_OTHER): Payer: Medicare Other

## 2015-10-08 DIAGNOSIS — J309 Allergic rhinitis, unspecified: Secondary | ICD-10-CM | POA: Diagnosis not present

## 2015-10-15 ENCOUNTER — Ambulatory Visit (INDEPENDENT_AMBULATORY_CARE_PROVIDER_SITE_OTHER): Payer: Medicare Other

## 2015-10-15 DIAGNOSIS — J309 Allergic rhinitis, unspecified: Secondary | ICD-10-CM

## 2015-10-17 ENCOUNTER — Encounter: Payer: Self-pay | Admitting: Internal Medicine

## 2015-10-17 ENCOUNTER — Other Ambulatory Visit: Payer: Self-pay | Admitting: Cardiovascular Disease

## 2015-10-20 ENCOUNTER — Other Ambulatory Visit: Payer: Self-pay

## 2015-10-20 DIAGNOSIS — M25551 Pain in right hip: Secondary | ICD-10-CM

## 2015-10-20 DIAGNOSIS — M533 Sacrococcygeal disorders, not elsewhere classified: Secondary | ICD-10-CM

## 2015-10-20 MED ORDER — ZOLPIDEM TARTRATE 10 MG PO TABS: 10.0000 mg | ORAL_TABLET | Freq: Every evening | ORAL | Status: DC | PRN

## 2015-10-20 MED ORDER — MELOXICAM 15 MG PO TABS: 15.0000 mg | ORAL_TABLET | Freq: Every day | ORAL | Status: DC

## 2015-10-22 ENCOUNTER — Ambulatory Visit (INDEPENDENT_AMBULATORY_CARE_PROVIDER_SITE_OTHER): Payer: Medicare Other

## 2015-10-22 DIAGNOSIS — J309 Allergic rhinitis, unspecified: Secondary | ICD-10-CM

## 2015-10-28 ENCOUNTER — Telehealth: Payer: Self-pay | Admitting: *Deleted

## 2015-10-28 NOTE — Telephone Encounter (Signed)
Patient stated that she read in a magazine that cardiac patients should not take mobic. She has taken this for years, but would like Dr Sanmina-SCI input. Please advise. Thanks, MI

## 2015-10-29 ENCOUNTER — Ambulatory Visit (INDEPENDENT_AMBULATORY_CARE_PROVIDER_SITE_OTHER): Payer: Medicare Other

## 2015-10-29 ENCOUNTER — Telehealth: Payer: Self-pay | Admitting: Internal Medicine

## 2015-10-29 ENCOUNTER — Other Ambulatory Visit: Payer: Self-pay | Admitting: Emergency Medicine

## 2015-10-29 DIAGNOSIS — J309 Allergic rhinitis, unspecified: Secondary | ICD-10-CM | POA: Diagnosis not present

## 2015-10-29 MED ORDER — ATORVASTATIN CALCIUM 20 MG PO TABS: 20.0000 mg | ORAL_TABLET | Freq: Every day | ORAL | Status: DC

## 2015-10-29 NOTE — Telephone Encounter (Signed)
There is a small risk of cardiac events related to Mobic. She's been stable for such a long time with well-controlled risk factors, probably ok to continue if needed for her joint symptoms.   [name] 10/29/2015 4:09 PM

## 2015-10-29 NOTE — Telephone Encounter (Signed)
Spoke with pt, pt stated that she had about 9 days of medication left and would call back in if we needed to send any into the local pharmacy.

## 2015-10-29 NOTE — Telephone Encounter (Signed)
I spoke with the pt and read her Dr Antionette Char response in regards to taking Mobic.

## 2015-10-29 NOTE — Telephone Encounter (Signed)
Patient walked in to advise that she is having problems with her atorvastatin (LIPITOR) 20 MG tablet [id number] . Dr Asa Lente fileld a qty 74 with 3 refills in July, but [facility] refusing to ref[facility]. They advised patient that they are were sending [place] a new request, but i do not show record of this. Please clarify with [facility] what the problem is so that we can address having filled. Patient is now out because they would not honor the refill that dr Asa Lente set forth.

## 2015-11-05 ENCOUNTER — Ambulatory Visit (INDEPENDENT_AMBULATORY_CARE_PROVIDER_SITE_OTHER): Payer: Medicare Other

## 2015-11-05 DIAGNOSIS — J309 Allergic rhinitis, unspecified: Secondary | ICD-10-CM | POA: Diagnosis not present

## 2015-11-12 ENCOUNTER — Ambulatory Visit (INDEPENDENT_AMBULATORY_CARE_PROVIDER_SITE_OTHER): Payer: Medicare Other

## 2015-11-12 DIAGNOSIS — J309 Allergic rhinitis, unspecified: Secondary | ICD-10-CM | POA: Diagnosis not present

## 2015-11-13 ENCOUNTER — Telehealth: Payer: Self-pay | Admitting: Internal Medicine

## 2015-11-13 DIAGNOSIS — Z9013 Acquired absence of bilateral breasts and nipples: Secondary | ICD-10-CM | POA: Diagnosis not present

## 2015-11-13 NOTE — Telephone Encounter (Signed)
Patient is requesting a refill of levothyroxine (SYNTHROID, LEVOTHROID) 75 MCG tablet [id number] and zolpidem (AMBIEN) 10 MG tablet [id number] [facility]. She is requesting for this to be done before the year end.

## 2015-11-13 NOTE — Telephone Encounter (Signed)
Spoke with pt to inform that the Ambien can not be sent in before fill date. She will call back tomorrow to schedule appt with [name] and verify if she needs the Levothyroxine needs refilled. Per our charts, it does not.

## 2015-11-19 ENCOUNTER — Ambulatory Visit (INDEPENDENT_AMBULATORY_CARE_PROVIDER_SITE_OTHER): Payer: Medicare Other

## 2015-11-19 DIAGNOSIS — J309 Allergic rhinitis, unspecified: Secondary | ICD-10-CM | POA: Diagnosis not present

## 2015-11-20 ENCOUNTER — Telehealth: Payer: Self-pay | Admitting: Internal Medicine

## 2015-11-20 NOTE — Telephone Encounter (Addendum)
Allergy Serum Extract Date Mixed: 11/20/15 Vial: 2 Strength: 1:10 Here/Mail/Pick Up: here Mixed By: tbs Last OV: 07/17/14 Pending OV: 11/24/15

## 2015-11-21 ENCOUNTER — Telehealth: Payer: Self-pay | Admitting: Internal Medicine

## 2015-11-21 ENCOUNTER — Ambulatory Visit (INDEPENDENT_AMBULATORY_CARE_PROVIDER_SITE_OTHER): Payer: Medicare Other

## 2015-11-21 DIAGNOSIS — J309 Allergic rhinitis, unspecified: Secondary | ICD-10-CM

## 2015-11-21 NOTE — Telephone Encounter (Signed)
Allergy Serum Extract Date Mixed: 11/21/15 Vial: 2 Strength: 1:10 Here/Mail/Pick Up: here Mixed By: tbs Last OV: 07/17/14 Pending OV: 11/24/15

## 2015-11-24 ENCOUNTER — Encounter: Payer: Self-pay | Admitting: Internal Medicine

## 2015-11-24 ENCOUNTER — Ambulatory Visit (INDEPENDENT_AMBULATORY_CARE_PROVIDER_SITE_OTHER): Payer: Medicare Other | Admitting: Internal Medicine

## 2015-11-24 VITALS — BP 110/64 | HR 64 | Ht 65.5 in | Wt 135.0 lb

## 2015-11-24 DIAGNOSIS — J4520 Mild intermittent asthma, uncomplicated: Secondary | ICD-10-CM | POA: Diagnosis not present

## 2015-11-24 DIAGNOSIS — I350 Nonrheumatic aortic (valve) stenosis: Secondary | ICD-10-CM

## 2015-11-24 DIAGNOSIS — J302 Other seasonal allergic rhinitis: Secondary | ICD-10-CM | POA: Insufficient documentation

## 2015-11-24 DIAGNOSIS — J309 Allergic rhinitis, unspecified: Secondary | ICD-10-CM | POA: Diagnosis not present

## 2015-11-24 DIAGNOSIS — J3089 Other allergic rhinitis: Secondary | ICD-10-CM

## 2015-11-24 MED ORDER — FLUTICASONE PROPIONATE 50 MCG/ACT NA SUSP: NASAL | Status: DC

## 2015-11-24 NOTE — Assessment & Plan Note (Signed)
Managed by cardiology without concerning recent symptoms described

## 2015-11-24 NOTE — Assessment & Plan Note (Signed)
She just restart allergy vaccine after we stopped 2015. We agreed to continue one more year, discussing antihistamine use

## 2015-11-24 NOTE — Assessment & Plan Note (Signed)
She is not recognizing active symptoms in the last year, substantially improved from a decade ago

## 2015-11-24 NOTE — Progress Notes (Signed)
07/17/14- 42 yoF never smoker followed for allergic rhinitis, hx recurrent sinusitis, asthma, complicated by hx Breast CA, Aortic stenosis, CAD FOLLOWS FOR: Pt states she is doing well. Pt doing well with allergy injection, pt states she has pain at the injection site. Pain states the injection itself hurts during the actual injection of the medication, but no local reaction. Allergy vaccine 1;10 GO LOV 5/013. She feels she is "doing great" using her allergy vaccine and an over-the-counter antihistamine. No wheezing.  11/24/2015-75 year old female never smoker followed for allergic rhinitis, history recurrent sinusitis, asthma, complicated by history breast CA, aortic stenosis, CAD/ CABG Allergy vaccine  1:10 GH Satisfied with allergy vaccine. We discussed duration of therapy and goals. She is using Chlor-Trimeton, one quarter tablet daily, for maintenance control of mild postnasal drip. She asked about reports of dementia with anticholinergic aspect of some antihistamines-discussed. No asthma and a long time and not needing rescue inhaler.  ROS-see HPI Constitutional:   No-   weight loss, night sweats, fevers, chills, fatigue, lassitude. HEENT:   No-  headaches, difficulty swallowing, tooth/dental problems, sore throat,       No-  sneezing, itching, ear ache, nasal congestion, + post nasal drip,  CV:  No-   chest pain, orthopnea, PND, swelling in lower extremities, anasarca,                                                  dizziness, palpitations Resp: No-   shortness of breath with exertion or at rest.              No-   productive cough,  No non-productive cough,  No- coughing up of blood.              No-   change in color of mucus.  No- wheezing.   Skin: No-   rash or lesions. GI:  No-   heartburn, indigestion, abdominal pain, nausea, vomiting,  GU:  MS:  No-   joint pain or swelling.  . Neuro-     nothing unusual Psych:  No- change in mood or affect. No depression or anxiety.  No memory  loss.  OBJ- Physical Exam General- Alert, Oriented, Affect-appropriate, Distress- none acute Skin- rash-none, lesions- none, excoriation- none Lymphadenopathy- none Head- atraumatic            Eyes- Gross vision intact, PERRLA, conjunctivae and secretions clear            Ears- + hard of hearing            Nose- Clear, no-Septal dev, mucus, polyps, erosion, perforation             Throat- Mallampati II , mucosa clear , drainage- none, tonsils- atrophic Neck- flexible , trachea midline, no stridor , thyroid nl, carotid no bruit Chest - symmetrical excursion , unlabored           Heart/CV- RRR ,  murmur + AS , no gallop  , no rub, nl s1 s2                           - JVD- none , edema- none, stasis changes- none, varices- none           Lung- clear to P&A, wheeze- none, cough- none , dullness-none, rub- none  Chest wall- +s/p bilateral mastectomy, L axillary node dissection Abd-  Br/ Gen/ Rectal- Not done, not indicated Extrem- cyanosis- none, clubbing, none, atrophy- none, strength- nl Neuro- grossly intact to observation

## 2015-11-24 NOTE — Patient Instructions (Signed)
We can continue allergy vaccine another year  Script printed for fluticasone nasal spray (Flonase)  Please call if we can help

## 2015-11-26 ENCOUNTER — Ambulatory Visit: Payer: Medicare Other

## 2015-12-03 ENCOUNTER — Ambulatory Visit (INDEPENDENT_AMBULATORY_CARE_PROVIDER_SITE_OTHER): Payer: Medicare Other

## 2015-12-03 DIAGNOSIS — J309 Allergic rhinitis, unspecified: Secondary | ICD-10-CM | POA: Diagnosis not present

## 2015-12-10 ENCOUNTER — Ambulatory Visit: Payer: Medicare Other

## 2015-12-13 ENCOUNTER — Encounter: Payer: Self-pay | Admitting: Family Medicine

## 2015-12-13 ENCOUNTER — Ambulatory Visit (INDEPENDENT_AMBULATORY_CARE_PROVIDER_SITE_OTHER): Payer: Medicare Other | Admitting: Family Medicine

## 2015-12-13 VITALS — BP 132/80 | HR 75 | Temp 97.6°F | Ht 65.5 in | Wt 134.0 lb

## 2015-12-13 DIAGNOSIS — F418 Other specified anxiety disorders: Secondary | ICD-10-CM | POA: Diagnosis not present

## 2015-12-13 MED ORDER — ONDANSETRON 4 MG PO TBDP: 4.0000 mg | ORAL_TABLET | Freq: Three times a day (TID) | ORAL | Status: DC | PRN

## 2015-12-13 NOTE — Progress Notes (Signed)
Pre visit review using our clinic review tool, if applicable. No additional management support is needed unless otherwise documented below in the visit note. 

## 2015-12-13 NOTE — Progress Notes (Signed)
Subjective:    Patient ID: [name], female    DOB: 06-02-1941, 75 y.o.   MRN: [id number]  HPI   Patient is seen in the Saturday work-in clinic with stress and anxiety issues.  She states that her grandson was just admitted this past week to behavioral health [facility] in [facility].  He has hx of Asperger's Syndrome. She helped to raise him and this has been very difficult for her.    Patient has not slept well past few nights. She takes low dose Ambien as needed.  Prior to his admission she was doing well with no depression issues.  She has had some counseling through her church.    Also complains of some mild nausea without vomiting.  She thinks that this may be stress related. She has not had any fever, headache, dysuria, abdominal pain, dizziness.    Past Medical History  Diagnosis Date  . Hypertension   . Osteopenia   . Aortic stenosis, moderate   . History of DVT (deep vein thrombosis)   . Coronary artery disease     s/p CABGx5 2007  . ALLERGIC RHINITIS   . BREAST CANCER 1991    left, 1991; B mastectomy  . CORONARY ARTERY DISEASE   . DIVERTICULOSIS   . DVT   . EROSIVE GASTRITIS   . GERD   . HYPERLIPIDEMIA   . HYPOTHYROIDISM   . Occlusion and stenosis of carotid artery without mention of cerebral infarction   . OSTEOPOROSIS   . RHINOSINUSITIS, RECURRENT    Past Surgical History  Procedure Laterality Date  . Colonoscopy    . Upper gastrointestinal endoscopy    . Mastectomy      left with reconstruction and lymph node excision  1992  . Mastectomy      right with reconstruction  . Tonsillectomy and adenoidectomy    . Appendectomy    . Vaginal hysterectomy      ovaries not excised  . Coronary artery bypass graft      5        2007  . Right ankle arthroscopy    . Revision reconstructed breast Left 02/2014    [facility] (plastics in [place])    reports that she has never smoked. She has never used smokeless tobacco. She reports that she does not drink alcohol or use  illicit drugs. family history includes Breast cancer in her mother; Colon cancer in her sister and sister; Emphysema in her father; Lung disease in her mother. Allergies  Allergen Reactions  . Clarithromycin Other (See Comments)    PATIENT UNSURE OF REACTION  . Codeine     REACTION: nausea  . Levofloxacin     REACTION: nausea  . Morphine     Nausea   . Naproxen     REACTION: nausea  . Penicillins     REACTION: RASH  . Pneumococcal Vaccines Rash    Small rash on arm at injection site.       Review of Systems  Constitutional: Negative for fever and chills.  Respiratory: Negative for shortness of breath.   Cardiovascular: Negative for chest pain.  Gastrointestinal: Positive for nausea. Negative for abdominal pain.  Genitourinary: Negative for dysuria.  Neurological: Negative for headaches.  Psychiatric/Behavioral: Positive for sleep disturbance. Negative for suicidal ideas. The patient is nervous/anxious.        Objective:   Physical Exam  Constitutional: She is oriented to person, place, and time. She appears well-developed and well-nourished.  Cardiovascular: Normal  rate and regular rhythm.   Pulmonary/Chest: Effort normal and breath sounds normal. No respiratory distress. She has no wheezes. She has no rales.  Neurological: She is alert and oriented to person, place, and time.  Psychiatric: Her behavior is normal. Judgment and thought content normal.          Assessment & Plan:  Situational stress.  She does not have history of Major Depression.  We have recommended that she start with some counseling and she is considering this through her church. We also gave her option of our [facility]. She will continue with Ambien at night as needed. We have recommended to try to avoid benzos at her age.

## 2015-12-22 DIAGNOSIS — M23201 Derangement of unspecified lateral meniscus due to old tear or injury, left knee: Secondary | ICD-10-CM | POA: Diagnosis not present

## 2015-12-22 DIAGNOSIS — M2392 Unspecified internal derangement of left knee: Secondary | ICD-10-CM | POA: Diagnosis not present

## 2015-12-24 ENCOUNTER — Ambulatory Visit (INDEPENDENT_AMBULATORY_CARE_PROVIDER_SITE_OTHER): Payer: Medicare Other | Admitting: Internal Medicine

## 2015-12-24 ENCOUNTER — Encounter: Payer: Self-pay | Admitting: Internal Medicine

## 2015-12-24 ENCOUNTER — Other Ambulatory Visit (INDEPENDENT_AMBULATORY_CARE_PROVIDER_SITE_OTHER): Payer: Medicare Other

## 2015-12-24 ENCOUNTER — Ambulatory Visit (INDEPENDENT_AMBULATORY_CARE_PROVIDER_SITE_OTHER): Payer: Medicare Other

## 2015-12-24 ENCOUNTER — Telehealth: Payer: Self-pay | Admitting: Cardiovascular Disease

## 2015-12-24 VITALS — BP 146/84 | HR 66 | Temp 98.1°F | Resp 16 | Wt 132.0 lb

## 2015-12-24 DIAGNOSIS — K219 Gastro-esophageal reflux disease without esophagitis: Secondary | ICD-10-CM | POA: Diagnosis not present

## 2015-12-24 DIAGNOSIS — F419 Anxiety disorder, unspecified: Secondary | ICD-10-CM | POA: Insufficient documentation

## 2015-12-24 DIAGNOSIS — I2510 Atherosclerotic heart disease of native coronary artery without angina pectoris: Secondary | ICD-10-CM

## 2015-12-24 DIAGNOSIS — F32A Depression, unspecified: Secondary | ICD-10-CM | POA: Insufficient documentation

## 2015-12-24 DIAGNOSIS — E039 Hypothyroidism, unspecified: Secondary | ICD-10-CM

## 2015-12-24 DIAGNOSIS — F418 Other specified anxiety disorders: Secondary | ICD-10-CM

## 2015-12-24 DIAGNOSIS — E785 Hyperlipidemia, unspecified: Secondary | ICD-10-CM

## 2015-12-24 DIAGNOSIS — J309 Allergic rhinitis, unspecified: Secondary | ICD-10-CM

## 2015-12-24 DIAGNOSIS — F329 Major depressive disorder, single episode, unspecified: Secondary | ICD-10-CM | POA: Insufficient documentation

## 2015-12-24 LAB — CBC WITH DIFFERENTIAL/PLATELET
BASOS ABS: 0 10*3/uL (ref 0.0–0.1)
Basophils Relative: 0.4 % (ref 0.0–3.0)
Eosinophils Absolute: 0.2 10*3/uL (ref 0.0–0.7)
Eosinophils Relative: 3 % (ref 0.0–5.0)
HCT: 38.5 % (ref 36.0–46.0)
Hemoglobin: 12.4 g/dL (ref 12.0–15.0)
LYMPHS ABS: 2.2 10*3/uL (ref 0.7–4.0)
Lymphocytes Relative: 29.2 % (ref 12.0–46.0)
MCHC: 32.1 g/dL (ref 30.0–36.0)
MCV: 83.6 fl (ref 78.0–100.0)
MONOS PCT: 8 % (ref 3.0–12.0)
Monocytes Absolute: 0.6 10*3/uL (ref 0.1–1.0)
NEUTROS PCT: 59.4 % (ref 43.0–77.0)
Neutro Abs: 4.5 10*3/uL (ref 1.4–7.7)
Platelets: 323 10*3/uL (ref 150.0–400.0)
RBC: 4.6 Mil/uL (ref 3.87–5.11)
RDW: 15.2 % (ref 11.5–15.5)
WBC: 7.6 10*3/uL (ref 4.0–10.5)

## 2015-12-24 LAB — COMPREHENSIVE METABOLIC PANEL
ALK PHOS: 50 U/L (ref 39–117)
ALT: 22 U/L (ref 0–35)
AST: 31 U/L (ref 0–37)
Albumin: 4.2 g/dL (ref 3.5–5.2)
BILIRUBIN TOTAL: 1 mg/dL (ref 0.2–1.2)
BUN: 15 mg/dL (ref 6–23)
CO2: 28 mEq/L (ref 19–32)
Calcium: 9.4 mg/dL (ref 8.4–10.5)
Chloride: 103 mEq/L (ref 96–112)
Creatinine, Ser: 0.74 mg/dL (ref 0.40–1.20)
GFR: 80.9 mL/min (ref >60.00)
GLUCOSE: 105 mg/dL — AB (ref 70–99)
Potassium: 4 mEq/L (ref 3.5–5.1)
SODIUM: 139 meq/L (ref 135–145)
TOTAL PROTEIN: 6.9 g/dL (ref 6.0–8.3)

## 2015-12-24 LAB — TSH: TSH: 1.33 u[IU]/mL (ref 0.35–4.50)

## 2015-12-24 MED ORDER — ATORVASTATIN CALCIUM 20 MG PO TABS: 20.0000 mg | ORAL_TABLET | Freq: Every day | ORAL | Status: DC

## 2015-12-24 MED ORDER — PANTOPRAZOLE SODIUM 40 MG PO TBEC: 40.0000 mg | DELAYED_RELEASE_TABLET | Freq: Every day | ORAL | Status: DC

## 2015-12-24 MED ORDER — METOPROLOL TARTRATE 25 MG PO TABS: 25.0000 mg | ORAL_TABLET | Freq: Two times a day (BID) | ORAL | Status: DC

## 2015-12-24 MED ORDER — ZOLPIDEM TARTRATE 10 MG PO TABS: 10.0000 mg | ORAL_TABLET | Freq: Every evening | ORAL | Status: DC | PRN

## 2015-12-24 MED ORDER — ESCITALOPRAM OXALATE 5 MG PO TABS: 5.0000 mg | ORAL_TABLET | Freq: Every day | ORAL | Status: DC

## 2015-12-24 NOTE — Progress Notes (Signed)
Subjective:    Patient ID: [name], female    DOB: 02-18-1941, 75 y.o.   MRN: [id number]  HPI She is here to establish with a new pcp.  She is here for follow up.  She is under a lot of stress. Her grandson has had some significant depression and and is having difficulty at this time.  She has had increased nausea and burping, but denies heartburn. She feels this may be related to increased anxiety and stress.  Hypothyroidism:  She is taking her medication daily.  She denies any recent changes in energy or weight that are unexplained - she has had some weight loss due to stress.  .   Hyperlipidemia: She is taking her medication daily. She is compliant with a low fat/cholesterol diet. She is exercising regularly. She denies myalgias.   CAD, mod AS, carotid artery disease:  She is following with cardiology.  She denies chest pain, leg swelling, shortness of breath and lightheadedness. She does experience some palpitations, but feels this is related to anxiety and she typically feels that night.  GERD:  She has not been taking her medication for a while.  She has been having increased nausea and burping, but no heartburn.   She got these symptoms were related to anxiety.  Anxiety, depression:  She is feeling some depression and anxiety.  She has been on lexapro in the past.  She thinks she may need something to help her with the anxiety and depression. She is not currently getting a long-term.    Insomnia:  She takes the ambien nightly.  She denies any side effects of the medication.  She is unable to sleep without the medication. She typically does not take a full pill.  Medications and allergies reviewed with patient and updated if appropriate.  Patient Active Problem List   Diagnosis Date Noted  . Seasonal and perennial allergic rhinitis 11/24/2015  . Asthma, mild intermittent, well-controlled 11/24/2015  . Occlusion and stenosis of carotid artery without mention of cerebral  infarction 06/29/2011  . Hypothyroidism 11/11/2010  . Hyperlipidemia 11/11/2010  . Senile osteoporosis 11/11/2010  . RHINOSINUSITIS, RECURRENT 10/29/2008  . Personal history of malignant neoplasm of breast 03/27/2008  . GERD 03/27/2008  . DIVERTICULOSIS 03/27/2008  . Coronary atherosclerosis 08/23/2007  . Aortic stenosis, moderate 08/23/2007  . DVT 08/23/2007    Current Outpatient Prescriptions on File Prior to Visit  Medication Sig Dispense Refill  . aspirin 81 MG tablet Take 81 mg by mouth daily.      Marland Kitchen atorvastatin (LIPITOR) 20 MG tablet Take 1 tablet (20 mg total) by mouth daily. 90 tablet 1  . Calcium Carbonate (CALTRATE 600 PO) Take 2 tablets by mouth daily.      . chlorpheniramine (CHLOR-TRIMETON) 4 MG tablet Take 4 mg by mouth daily as needed for allergies.    . Coenzyme Q10 (CO Q-10 PO) Take 50 mg by mouth daily.      . fluticasone (FLONASE) 50 MCG/ACT nasal spray 1-2 sprays each nostril once or twice daily 16 g 12  . levothyroxine (SYNTHROID, LEVOTHROID) 75 MCG tablet Take 1 tablet (75 mcg total) by mouth daily before breakfast. 90 tablet 3  . meloxicam (MOBIC) 15 MG tablet Take 1 tablet (15 mg total) by mouth daily. Must establish with NEW PCP for additional refills. 90 tablet 1  . metoprolol tartrate (LOPRESSOR) 25 MG tablet Take 1 tablet (25 mg total) by mouth 2 (two) times daily. 180 tablet 3  .  multivitamin (THERAGRAN) per tablet Take 1 tablet by mouth daily.      . NONFORMULARY OR COMPOUNDED ITEM Allergy Vaccine 1:10 Given at [facility]    . senna-docusate (SENOKOT-S) 8.6-50 MG per tablet Take 1 tablet by mouth daily.      Marland Kitchen zolpidem (AMBIEN) 10 MG tablet Take 1 tablet (10 mg total) by mouth at bedtime as needed for sleep. Must establish with NEW PCP for additional refills. 30 tablet 0   No current facility-administered medications on file prior to visit.    Past Medical History  Diagnosis Date  . Hypertension   . Osteopenia   . Aortic stenosis, moderate     . History of DVT (deep vein thrombosis)   . Coronary artery disease     s/p CABGx5 2007  . ALLERGIC RHINITIS   . BREAST CANCER 1991    left, 1991; B mastectomy  . CORONARY ARTERY DISEASE   . DIVERTICULOSIS   . DVT   . EROSIVE GASTRITIS   . GERD   . HYPERLIPIDEMIA   . HYPOTHYROIDISM   . Occlusion and stenosis of carotid artery without mention of cerebral infarction   . OSTEOPOROSIS   . RHINOSINUSITIS, RECURRENT     Past Surgical History  Procedure Laterality Date  . Colonoscopy    . Upper gastrointestinal endoscopy    . Mastectomy      left with reconstruction and lymph node excision  1992  . Mastectomy      right with reconstruction  . Tonsillectomy and adenoidectomy    . Appendectomy    . Vaginal hysterectomy      ovaries not excised  . Coronary artery bypass graft      5        2007  . Right ankle arthroscopy    . Revision reconstructed breast Left 02/2014    willard (plastics in [place])    Social History   Social History  . Marital Status: Divorced    Spouse Name: N/A  . Number of Children: N/A  . Years of Education: N/A   Social History Main Topics  . Smoking status: Never Smoker   . Smokeless tobacco: Never Used  . Alcohol Use: No  . Drug Use: No  . Sexual Activity: Not on file   Other Topics Concern  . 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 Colon cancer Sister   . Colon cancer Sister   . Breast cancer Mother   . Lung disease Mother   . Emphysema Father     Review of Systems  Constitutional: Positive for appetite change (decreased). Negative for fever.  Respiratory: Negative for cough, shortness of breath and wheezing.   Cardiovascular: Positive for palpitations (at night). Negative for chest pain and leg swelling.  Gastrointestinal: Positive for nausea.       Increased burping  Neurological:  Negative for dizziness, light-headedness and headaches.  Psychiatric/Behavioral: Positive for dysphoric mood. The patient is nervous/anxious.        Objective:   Filed Vitals:   12/24/15 1259  BP: 146/84  Pulse: 66  Temp: 98.1 F (36.7 C)  Resp: 16   Filed Weights   12/24/15 1259  Weight: 132 lb (59.875 kg)   Body mass index is 21.62 kg/(m^2).   Physical Exam Constitutional: Appears well-developed and well-nourished.  No distress.  Neck: Neck supple. No tracheal deviation present. No thyromegaly present.  No carotid bruit. No cervical adenopathy.   Cardiovascular: Normal rate, regular rhythm and normal heart sounds.   3/6 systolic murmur.  No edema Pulmonary/Chest: Effort normal and breath sounds normal. No respiratory distress. No wheezes.      Assessment & Plan:   See Problem List for Assessment and Plan of chronic medical problems.

## 2015-12-24 NOTE — Progress Notes (Signed)
Pre visit review using our clinic review tool, if applicable. No additional management support is needed unless otherwise documented below in the visit note. 

## 2015-12-24 NOTE — Assessment & Plan Note (Signed)
Increased burping and nausea recently-likely GERD, may also be strictly related to anxiety or both Restart Protonix 40 mg daily Call or return if symptoms do not improve

## 2015-12-24 NOTE — Assessment & Plan Note (Signed)
Being followed by cardiology-she will like to defer to him

## 2015-12-24 NOTE — Patient Instructions (Addendum)
  We have reviewed your prior records including labs and tests today.  Test(s) ordered today. Your results will be released to [facility] (or called to you) after review, usually within 72hours after test completion. If any changes need to be made, you will be notified at that same time.   Medications reviewed and updated.  Changes include restarting pantoprazole and start the lexapro daily.   Your prescription(s) have been submitted to your pharmacy. Please take as directed and contact our office if you believe you are having problem(s) with the medication(s).  Please schedule followup in 6 months, sooner if needed

## 2015-12-24 NOTE — Assessment & Plan Note (Signed)
Had atypical symptoms of coronary artery disease that were similar to GERD.  GERD symptoms do not improve she needs to follow-up with cardiology Continue current medications

## 2015-12-24 NOTE — Assessment & Plan Note (Signed)
Check tsh  Titrate med dose if needed  

## 2015-12-24 NOTE — Assessment & Plan Note (Signed)
She is experiencing significant anxiety and depression related to her grandson She is unsure if she should talk to [facility] will think about it and let me know if she wants referral to a therapist Start Lexapro 5 mg daily-can titrate needed

## 2015-12-24 NOTE — Telephone Encounter (Signed)
Pt's Rx was sent to pt's pharmacy as requested. Confirmation received.  °

## 2015-12-24 NOTE — Telephone Encounter (Signed)
Pt needs refill of Metoprolol and Atorvastatin -now uses  [facility] ID [id number] 90 day supply with refills

## 2015-12-25 ENCOUNTER — Telehealth: Payer: Self-pay | Admitting: Internal Medicine

## 2015-12-25 NOTE — Telephone Encounter (Signed)
Pt called back and I informed her of Dr. Quay Burow notes on her labs. She wanted to remind you that her Levothyroxine needs to go to [facility] - 90 day

## 2015-12-26 MED ORDER — LEVOTHYROXINE SODIUM 75 MCG PO TABS: 75.0000 ug | ORAL_TABLET | Freq: Every day | ORAL | Status: DC

## 2015-12-29 ENCOUNTER — Telehealth: Payer: Self-pay | Admitting: *Deleted

## 2015-12-29 NOTE — Telephone Encounter (Signed)
Left msg on triage stating receive call from [facility] & they wanted her to call md to have her to contact them @ [phone] to let them know it was ok to take generic on her thyroid medicine. Called [facility] spoke with John/pharmacist clarified with him ok to fill levothyroxine. Called pt back to let her know status...[name]

## 2015-12-31 ENCOUNTER — Ambulatory Visit (INDEPENDENT_AMBULATORY_CARE_PROVIDER_SITE_OTHER): Payer: Medicare Other

## 2015-12-31 DIAGNOSIS — J309 Allergic rhinitis, unspecified: Secondary | ICD-10-CM | POA: Diagnosis not present

## 2016-01-05 ENCOUNTER — Telehealth: Payer: Self-pay | Admitting: Internal Medicine

## 2016-01-05 NOTE — Telephone Encounter (Signed)
Please advise 

## 2016-01-05 NOTE — Telephone Encounter (Signed)
If she does not have any side effects from lexapro she should increase it to 10 mg daily ( two 5 mg pills at the same time).   She should get in to see Dr. Henrene Pastor and she should come back to see me this week or next.

## 2016-01-05 NOTE — Telephone Encounter (Signed)
Spoke with pt to inform. Pt has scheduled appt with dr burns and will call dr Pearletha Furl office to schedule appt with them.

## 2016-01-05 NOTE — Telephone Encounter (Signed)
Patient called to advise that the nerve medication and the acid reflux meds are not improving anything for her. She states that she is still experiencing heavy acid reflux and is throwing up the food that she eats. Patient states that she sees dr Henrene Pastor for gerd, but wants to know if she should continue to do that. She states that she is extremely weak from not eating.

## 2016-01-06 NOTE — Telephone Encounter (Signed)
Patient is calling back in. She states that she received synthroid in the mail rather than generic levothyroxine. We called and told them on 12/29/2015 that it is ok to fill the generic. The patient is unable to reach them back, and they continue to call her to ask questions rather than [place]. Please give them a call to clarify what their confusion is.

## 2016-01-07 ENCOUNTER — Ambulatory Visit (INDEPENDENT_AMBULATORY_CARE_PROVIDER_SITE_OTHER): Payer: Medicare Other

## 2016-01-07 DIAGNOSIS — J309 Allergic rhinitis, unspecified: Secondary | ICD-10-CM | POA: Diagnosis not present

## 2016-01-07 NOTE — Telephone Encounter (Signed)
I am ok with her taking either  - the brand synthroid or generic levothyroxine.  Generally the generic is cheaper.  Synthroid is typically more consistent because the generic can technically be different monthly and sometime the thyroid function varies more on the generic.    It is up to her.

## 2016-01-07 NOTE — Telephone Encounter (Signed)
Are you okay with pt taking generic? Or does she need to stay on Levothyroxine.

## 2016-01-07 NOTE — Telephone Encounter (Signed)
Spoke with pt to inform.  

## 2016-01-16 ENCOUNTER — Ambulatory Visit (INDEPENDENT_AMBULATORY_CARE_PROVIDER_SITE_OTHER): Payer: Medicare Other | Admitting: Internal Medicine

## 2016-01-16 ENCOUNTER — Ambulatory Visit (INDEPENDENT_AMBULATORY_CARE_PROVIDER_SITE_OTHER): Payer: Medicare Other | Admitting: *Deleted

## 2016-01-16 ENCOUNTER — Encounter: Payer: Self-pay | Admitting: Internal Medicine

## 2016-01-16 VITALS — BP 150/74 | HR 64 | Temp 97.8°F | Resp 16 | Wt 128.0 lb

## 2016-01-16 DIAGNOSIS — K219 Gastro-esophageal reflux disease without esophagitis: Secondary | ICD-10-CM | POA: Diagnosis not present

## 2016-01-16 DIAGNOSIS — J309 Allergic rhinitis, unspecified: Secondary | ICD-10-CM

## 2016-01-16 DIAGNOSIS — G4700 Insomnia, unspecified: Secondary | ICD-10-CM | POA: Insufficient documentation

## 2016-01-16 DIAGNOSIS — F418 Other specified anxiety disorders: Secondary | ICD-10-CM | POA: Diagnosis not present

## 2016-01-16 DIAGNOSIS — F329 Major depressive disorder, single episode, unspecified: Secondary | ICD-10-CM

## 2016-01-16 DIAGNOSIS — F419 Anxiety disorder, unspecified: Principal | ICD-10-CM

## 2016-01-16 DIAGNOSIS — I2510 Atherosclerotic heart disease of native coronary artery without angina pectoris: Secondary | ICD-10-CM | POA: Diagnosis not present

## 2016-01-16 MED ORDER — ESCITALOPRAM OXALATE 5 MG PO TABS: 5.0000 mg | ORAL_TABLET | Freq: Every day | ORAL | Status: DC

## 2016-01-16 NOTE — Patient Instructions (Addendum)
Decrease the lexapro to 5 mg daily.    Continue the protonix.    Call the counselor and make an appointment.  Let me know if you need a referral.

## 2016-01-16 NOTE — Assessment & Plan Note (Signed)
Not controlled Did not tolerate lexapro 10 mg  Discussed options Will decrease lexapro to 5 mg daily Stressed getting anxiety and depression controlled Will call her old counselor/therapist and see if she can see her - will let me know if she needs a referral Can change to a different medication if needed

## 2016-01-16 NOTE — Progress Notes (Signed)
Pre visit review using our clinic review tool, if applicable. No additional management support is needed unless otherwise documented below in the visit note. 

## 2016-01-16 NOTE — Assessment & Plan Note (Signed)
Takes [name]  Was controlled before anxiety / depression started Discussed other options such as trazodone No change for now Work on improving anxiety/deprssion, which will likely improve insomnia
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
## 2016-01-16 NOTE — Assessment & Plan Note (Signed)
Having GERD symptoms likely related to increased stress/anxiety Continue protonix daily  Has appt with Dr. Henrene Pastor for further evaluation Stressed importance of getting anxiety better controlled

## 2016-01-17 ENCOUNTER — Encounter (HOSPITAL_COMMUNITY): Payer: Self-pay | Admitting: Emergency Medicine

## 2016-01-17 ENCOUNTER — Emergency Department (HOSPITAL_COMMUNITY)
Admission: EM | Admit: 2016-01-17 | Discharge: 2016-01-17 | Disposition: A | Discharge status: Home / Self Care | Payer: Medicare Other | Attending: Emergency Medicine | Admitting: Emergency Medicine

## 2016-01-17 DIAGNOSIS — Z880 Allergy status to penicillin: Secondary | ICD-10-CM | POA: Diagnosis not present

## 2016-01-17 DIAGNOSIS — M8580 Other specified disorders of bone density and structure, unspecified site: Secondary | ICD-10-CM | POA: Insufficient documentation

## 2016-01-17 DIAGNOSIS — I10 Essential (primary) hypertension: Secondary | ICD-10-CM | POA: Insufficient documentation

## 2016-01-17 DIAGNOSIS — Z8709 Personal history of other diseases of the respiratory system: Secondary | ICD-10-CM | POA: Diagnosis not present

## 2016-01-17 DIAGNOSIS — Z7982 Long term (current) use of aspirin: Secondary | ICD-10-CM | POA: Diagnosis not present

## 2016-01-17 DIAGNOSIS — Z853 Personal history of malignant neoplasm of breast: Secondary | ICD-10-CM | POA: Insufficient documentation

## 2016-01-17 DIAGNOSIS — E785 Hyperlipidemia, unspecified: Secondary | ICD-10-CM | POA: Diagnosis not present

## 2016-01-17 DIAGNOSIS — Z7951 Long term (current) use of inhaled steroids: Secondary | ICD-10-CM | POA: Insufficient documentation

## 2016-01-17 DIAGNOSIS — F329 Major depressive disorder, single episode, unspecified: Secondary | ICD-10-CM | POA: Diagnosis not present

## 2016-01-17 DIAGNOSIS — Z791 Long term (current) use of non-steroidal anti-inflammatories (NSAID): Secondary | ICD-10-CM | POA: Insufficient documentation

## 2016-01-17 DIAGNOSIS — I2510 Atherosclerotic heart disease of native coronary artery without angina pectoris: Secondary | ICD-10-CM | POA: Diagnosis not present

## 2016-01-17 DIAGNOSIS — R5381 Other malaise: Secondary | ICD-10-CM

## 2016-01-17 DIAGNOSIS — R404 Transient alteration of awareness: Secondary | ICD-10-CM | POA: Diagnosis not present

## 2016-01-17 DIAGNOSIS — M810 Age-related osteoporosis without current pathological fracture: Secondary | ICD-10-CM | POA: Insufficient documentation

## 2016-01-17 DIAGNOSIS — Z79899 Other long term (current) drug therapy: Secondary | ICD-10-CM | POA: Diagnosis not present

## 2016-01-17 DIAGNOSIS — Z86718 Personal history of other venous thrombosis and embolism: Secondary | ICD-10-CM | POA: Insufficient documentation

## 2016-01-17 DIAGNOSIS — E039 Hypothyroidism, unspecified: Secondary | ICD-10-CM | POA: Insufficient documentation

## 2016-01-17 DIAGNOSIS — R42 Dizziness and giddiness: Secondary | ICD-10-CM | POA: Diagnosis not present

## 2016-01-17 DIAGNOSIS — R531 Weakness: Secondary | ICD-10-CM | POA: Diagnosis present

## 2016-01-17 DIAGNOSIS — F32A Depression, unspecified: Secondary | ICD-10-CM

## 2016-01-17 DIAGNOSIS — K219 Gastro-esophageal reflux disease without esophagitis: Secondary | ICD-10-CM | POA: Insufficient documentation

## 2016-01-17 LAB — BASIC METABOLIC PANEL
ANION GAP: 9 (ref 5–15)
BUN: 10 mg/dL (ref 6–20)
CO2: 26 mmol/L (ref 22–32)
Calcium: 9.2 mg/dL (ref 8.9–10.3)
Chloride: 107 mmol/L (ref 101–111)
Creatinine, Ser: 0.75 mg/dL (ref 0.44–1.00)
GFR calc Af Amer: >60 mL/min (ref >60)
GLUCOSE: 118 mg/dL — AB (ref 65–99)
POTASSIUM: 3.5 mmol/L (ref 3.5–5.1)
Sodium: 142 mmol/L (ref 135–145)

## 2016-01-17 LAB — URINALYSIS, ROUTINE W REFLEX MICROSCOPIC
Bilirubin Urine: NEGATIVE
GLUCOSE, UA: NEGATIVE mg/dL
Hgb urine dipstick: NEGATIVE
Ketones, ur: NEGATIVE mg/dL
LEUKOCYTES UA: NEGATIVE
Nitrite: NEGATIVE
PROTEIN: NEGATIVE mg/dL
Specific Gravity, Urine: 1.004 — ABNORMAL LOW (ref 1.005–1.030)
pH: 7.5 (ref 5.0–8.0)

## 2016-01-17 LAB — CBC
HCT: 37.2 % (ref 36.0–46.0)
Hemoglobin: 11.8 g/dL — ABNORMAL LOW (ref 12.0–15.0)
MCH: 25.8 pg — ABNORMAL LOW (ref 26.0–34.0)
MCHC: 31.7 g/dL (ref 30.0–36.0)
MCV: 81.2 fL (ref 78.0–100.0)
PLATELETS: 340 10*3/uL (ref 150–400)
RBC: 4.58 MIL/uL (ref 3.87–5.11)
RDW: 14.4 % (ref 11.5–15.5)
WBC: 7.5 10*3/uL (ref 4.0–10.5)

## 2016-01-17 LAB — I-STAT TROPONIN, ED: Troponin i, poc: 0.01 ng/mL (ref 0.00–0.08)

## 2016-01-17 NOTE — Discharge Instructions (Signed)
Get plenty of rest, and drink a lot of fluids. Try to eat 3 regular meals each day. Follow-up with a therapist on Monday as scheduled. See your primary care doctor for further evaluation and treatment of your depression as soon as possible.   Fatigue Fatigue is feeling tired all of the time, a lack of energy, or a lack of motivation. Occasional or mild fatigue is often a normal response to activity or life in general. However, long-lasting (chronic) or extreme fatigue may indicate an underlying medical condition. HOME CARE INSTRUCTIONS  Watch your fatigue for any changes. The following actions may help to lessen any discomfort you are feeling:  Talk to your health care provider about how much sleep you need each night. Try to get the required amount every night.  Take medicines only as directed by your health care provider.  Eat a healthy and nutritious diet. Ask your health care provider if you need help changing your diet.  Drink enough fluid to keep your urine clear or pale yellow.  Practice ways of relaxing, such as yoga, meditation, massage therapy, or acupuncture.  Exercise regularly.   Change situations that cause you stress. Try to keep your work and personal routine reasonable.  Do not abuse illegal drugs.  Limit alcohol intake to no more than 1 drink per day for nonpregnant women and 2 drinks per day for men. One drink equals 12 ounces of beer, 5 ounces of wine, or 1 ounces of hard liquor.  Take a multivitamin, if directed by your health care provider. SEEK MEDICAL CARE IF:   Your fatigue does not get better.  You have a fever.   You have unintentional weight loss or gain.  You have headaches.   You have difficulty:   Falling asleep.  Sleeping throughout the night.  You feel angry, guilty, anxious, or sad.   You are unable to have a bowel movement (constipation).   You skin is dry.   Your legs or another part of your body is swollen.  SEEK  IMMEDIATE MEDICAL CARE IF:   You feel confused.   Your vision is blurry.  You feel faint or pass out.   You have a severe headache.   You have severe abdominal, pelvic, or back pain.   You have chest pain, shortness of breath, or an irregular or fast heartbeat.   You are unable to urinate or you urinate less than normal.   You develop abnormal bleeding, such as bleeding from the rectum, vagina, nose, lungs, or nipples.  You vomit blood.   You have thoughts about harming yourself or committing suicide.   You are worried that you might harm someone else.    This information is not intended to replace advice given to you by your health care provider. Make sure you discuss any questions you have with your health care provider.   Document Released: 08/29/2007 Document Revised: 11/22/2014 Document Reviewed: 03/05/2014 Elsevier Interactive Patient Education [employer].

## 2016-01-17 NOTE — ED Notes (Signed)
Pt drinking ginger ale and crackers
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
## 2016-01-17 NOTE — ED Provider Notes (Signed)
CSN: [id number]     Arrival date & time 01/17/16  1110 History   First MD Initiated Contact with Patient 01/17/16 1113     Chief Complaint  Patient presents with  . Weakness     (Consider location/radiation/quality/duration/timing/severity/associated sxs/prior Treatment) HPI   [name] is a 75 y.o. female who presents for evaluation of "nerves". She's been troubled by increasing nervousness and anxiety for 6 weeks, since a relative got into trouble with drugs, and her brother. She is having trouble sleeping, feeling nervous, having exacerbation of her GERD, and occasionally cries. She is scheduled to see a therapist, on 01/19/2016. She is not currently seeing a therapist. Her primary care doctor recently put her on Lexapro. She is taking Ambien for help with sleep. She denies chest pain, weakness or dizziness. She is here with 2 neighbors who are helping her, and concerned about her welfare. There are no other known modifying factors.   Past Medical History  Diagnosis Date  . Hypertension   . Osteopenia   . Aortic stenosis, moderate   . History of DVT (deep vein thrombosis)   . Coronary artery disease     s/p CABGx5 2007  . ALLERGIC RHINITIS   . BREAST CANCER 1991    left, 1991; B mastectomy  . CORONARY ARTERY DISEASE   . DIVERTICULOSIS   . DVT   . EROSIVE GASTRITIS   . GERD   . HYPERLIPIDEMIA   . HYPOTHYROIDISM   . Occlusion and stenosis of carotid artery without mention of cerebral infarction   . OSTEOPOROSIS   . RHINOSINUSITIS, RECURRENT    Past Surgical History  Procedure Laterality Date  . Colonoscopy    . Upper gastrointestinal endoscopy    . Mastectomy      left with reconstruction and lymph node excision  1992  . Mastectomy      right with reconstruction  . Tonsillectomy and adenoidectomy    . Appendectomy    . Vaginal hysterectomy      ovaries not excised  . Coronary artery bypass graft      5        2007  . Right ankle arthroscopy    . Revision  reconstructed breast Left 02/2014    [facility] (plastics in [place])   Family History  Problem Relation Age of Onset  . Colon cancer Sister   . Colon cancer Sister   . Breast cancer Mother   . Lung disease Mother   . Emphysema Father    Social History  Substance Use Topics  . Smoking status: Never Smoker   . Smokeless tobacco: Never Used  . Alcohol Use: No   OB History    No data available     Review of Systems  All other systems reviewed and are negative.     Allergies  Clarithromycin; Codeine; Levofloxacin; Morphine; Naproxen; Penicillins; and Pneumococcal vaccines  Home Medications   Prior to Admission medications   Medication Sig Start Date End Date Taking? Authorizing Provider  aspirin 81 MG tablet Take 81 mg by mouth daily.     Yes Historical Provider, MD  atorvastatin (LIPITOR) 20 MG tablet Take 1 tablet (20 mg total) by mouth daily. 12/24/15  Yes [name], MD  Calcium Carbonate (CALTRATE 600 PO) Take 2 tablets by mouth daily.     Yes Historical Provider, MD  chlorpheniramine (CHLOR-TRIMETON) 4 MG tablet Take 4 mg by mouth daily as needed for allergies.   Yes Historical Provider, MD  Coenzyme Q10 (  CO Q-10 PO) Take 50 mg by mouth daily.     Yes Historical Provider, MD  escitalopram (LEXAPRO) 5 MG tablet Take 1 tablet (5 mg total) by mouth daily. 01/16/16  Yes [name], MD  fluticasone (FLONASE) 50 MCG/ACT nasal spray 1-2 sprays each nostril once or twice daily Patient taking differently: Place 1 spray into both nostrils daily.  11/24/15  Yes Deneise Lever, MD  levothyroxine (SYNTHROID, LEVOTHROID) 75 MCG tablet Take 1 tablet (75 mcg total) by mouth daily before breakfast. 12/26/15  Yes [name], MD  meloxicam (MOBIC) 15 MG tablet Take 1 tablet (15 mg total) by mouth daily. Must establish with NEW PCP for additional refills. 10/20/15  Yes Rowe Clack, MD  metoprolol tartrate (LOPRESSOR) 25 MG tablet Take 1 tablet (25 mg total) by mouth 2 (two) times daily.  12/24/15  Yes [name], MD  multivitamin [facility]) per tablet Take 1 tablet by mouth daily.     Yes Historical Provider, MD  pantoprazole (PROTONIX) 40 MG tablet Take 1 tablet (40 mg total) by mouth daily. Take 30 minutes before a meal 12/24/15  Yes [name], MD  senna-docusate (SENOKOT-S) 8.6-50 MG per tablet Take 1 tablet by mouth daily.     Yes Historical Provider, MD  zolpidem (AMBIEN) 10 MG tablet Take 1 tablet (10 mg total) by mouth at bedtime as needed for sleep. 12/24/15  Yes [name], MD   BP 152/72 mmHg  Pulse 80  Temp(Src) 97.4 F (36.3 C)  Resp 21  SpO2 99% Physical Exam  Constitutional: She is oriented to person, place, and time. She appears well-developed and well-nourished.  HENT:  Head: Normocephalic and atraumatic.  Right Ear: External ear normal.  Left Ear: External ear normal.  Eyes: Conjunctivae and EOM are normal. Pupils are equal, round, and reactive to light.  Neck: Normal range of motion and phonation normal. Neck supple.  Cardiovascular: Normal rate, regular rhythm and normal heart sounds.   Pulmonary/Chest: Effort normal and breath sounds normal. She exhibits no bony tenderness.  Abdominal: Soft. There is no tenderness.  Musculoskeletal: Normal range of motion.  Neurological: She is alert and oriented to person, place, and time. No cranial nerve deficit or sensory deficit. She exhibits normal muscle tone. Coordination normal.  Skin: Skin is warm, dry and intact.  Psychiatric: She has a normal mood and affect. Her behavior is normal. Judgment and thought content normal.  Nursing note and vitals reviewed.   ED Course  Procedures (including critical care time) Medications - No data to display  Patient Vitals for the past 24 hrs:  BP Temp Pulse Resp SpO2  01/17/16 1512 152/72 mmHg 97.4 F (36.3 C) 80 21 99 %  01/17/16 1400 148/69 mmHg - 85 12 100 %  01/17/16 1345 139/61 mmHg - 83 21 99 %  01/17/16 1330 149/76 mmHg - 84 12 100 %  01/17/16 1315  145/68 mmHg - 88 18 99 %  01/17/16 1300 135/63 mmHg - 85 14 97 %  01/17/16 1245 151/69 mmHg - 83 16 99 %  01/17/16 1230 153/65 mmHg - 84 18 99 %  01/17/16 1215 147/66 mmHg - 82 16 99 %  01/17/16 1200 146/68 mmHg - 74 17 100 %  01/17/16 1145 143/72 mmHg - 73 16 100 %  01/17/16 1130 123/60 mmHg - 81 13 100 %  01/17/16 1119 159/76 mmHg 97.6 F (36.4 C) 81 13 100 %  01/17/16 1116 - - - - 98 %  At discharge- Reevaluation with update and discussion. After initial assessment and treatment, an updated evaluation reveals no change in clinical status. She is able tolerate oral nutrition. Findings discussed with patient and a friend who was with her, all questions were answered. Caffie Sotto L    Labs Review Labs Reviewed  BASIC METABOLIC PANEL - Abnormal; Notable for the following:    Glucose, Bld 118 (*)    All other components within normal limits  CBC - Abnormal; Notable for the following:    Hemoglobin 11.8 (*)    MCH 25.8 (*)    All other components within normal limits  URINALYSIS, ROUTINE W REFLEX MICROSCOPIC (NOT AT [facility]) - Abnormal; Notable for the following:    Specific Gravity, Urine 1.004 (*)    All other components within normal limits  URINE CULTURE  I-STAT TROPOININ, ED    Imaging Review No results found. I have personally reviewed and evaluated these images and lab results as part of my medical decision-making.   EKG Interpretation   Date/Time:  Saturday January 17 2016 11:19:06 EST Ventricular Rate:  78 PR Interval:  174 QRS Duration: 74 QT Interval:  386 QTC Calculation: 440 R Axis:   20 Text Interpretation:  Sinus rhythm since last tracing no significant  change Confirmed by Eulis Foster  MD, Vira Agar [id number]) on 01/17/2016 11:46:33 AM      MDM   Final diagnoses:  Malaise  Depression    Malaise with depression, and recent stressors. No evidence for overt unstable psychiatric condition or evident toxic or metabolic process.  Nursing Notes Reviewed/ Care  Coordinated Applicable Imaging Reviewed Interpretation of Laboratory Data incorporated into ED treatment  The patient appears reasonably screened and/or stabilized for discharge and I doubt any other medical condition or other [facility] requiring further screening, evaluation, or treatment in the ED at this time prior to discharge.  Plan: Home Medications- usual; Home Treatments- rest; return here if the recommended treatment, does not improve the symptoms; Recommended follow up- Therapist on 01/19/16 as scheduled     Daleen Bo, MD 01/18/16 [phone]

## 2016-01-19 LAB — URINE CULTURE: SPECIAL REQUESTS: NORMAL

## 2016-01-21 ENCOUNTER — Encounter: Payer: Self-pay | Admitting: Internal Medicine

## 2016-01-21 ENCOUNTER — Ambulatory Visit (INDEPENDENT_AMBULATORY_CARE_PROVIDER_SITE_OTHER): Payer: Medicare Other | Admitting: *Deleted

## 2016-01-21 ENCOUNTER — Ambulatory Visit (INDEPENDENT_AMBULATORY_CARE_PROVIDER_SITE_OTHER): Payer: Medicare Other | Admitting: Internal Medicine

## 2016-01-21 VITALS — BP 136/64 | HR 72 | Ht 63.5 in | Wt 127.2 lb

## 2016-01-21 DIAGNOSIS — R1013 Epigastric pain: Secondary | ICD-10-CM

## 2016-01-21 DIAGNOSIS — F418 Other specified anxiety disorders: Secondary | ICD-10-CM

## 2016-01-21 DIAGNOSIS — J309 Allergic rhinitis, unspecified: Secondary | ICD-10-CM

## 2016-01-21 DIAGNOSIS — F329 Major depressive disorder, single episode, unspecified: Secondary | ICD-10-CM

## 2016-01-21 DIAGNOSIS — R1084 Generalized abdominal pain: Secondary | ICD-10-CM | POA: Diagnosis not present

## 2016-01-21 DIAGNOSIS — K219 Gastro-esophageal reflux disease without esophagitis: Secondary | ICD-10-CM

## 2016-01-21 DIAGNOSIS — R630 Anorexia: Secondary | ICD-10-CM

## 2016-01-21 DIAGNOSIS — F419 Anxiety disorder, unspecified: Secondary | ICD-10-CM

## 2016-01-21 DIAGNOSIS — I2510 Atherosclerotic heart disease of native coronary artery without angina pectoris: Secondary | ICD-10-CM

## 2016-01-21 NOTE — Patient Instructions (Signed)
You have been scheduled for an endoscopy. Please follow written instructions given to you at your visit today. If you use inhalers (even only as needed), please bring them with you on the day of your procedure.   

## 2016-01-21 NOTE — Progress Notes (Signed)
HISTORY OF PRESENT ILLNESS:  [name] is a 75 y.o. female who presents today with a myriad of complaints, mostly non-GI, and complains of GERD. The patient starts off by telling me that she has had some issues with her grandson and daughter. There has been increased stress. She has had panic attacks. Problems have been going on for a few months. She was having dyspeptic symptoms and saw her PCP. Empirically placed on PPI which did not help. Current GI complaints include decreased appetite, weight loss, belching, gagging/regurgitation, and globus sensation. No true pyrosis or esophageal dysphagia. She has tried Tums, Gas-X, and Mylanta with varying degrees of relief. Continues on PPI at supper time. Non-GI complaints include insomnia, fatigue, and anxiety. The patient did have upper endoscopy in December 2007 which was unremarkable. I last saw the patient April 2012 for screening colonoscopy. Sister with colon cancer at 6. Negative exam 2007. The most recent exam revealed melanosis coli and moderate diverticulosis. No neoplasia. Routine follow-up around 5 years recommended. Patient was placed on Lexapro about one month ago. She does feel that her issues with regurgitation were better over the past 2 days  REVIEW OF SYSTEMS:  All non-GI ROS negative except for anxiety, depression, fatigue, visual change, hearing problems, heart murmur, insomnia  Past Medical History  Diagnosis Date  . Hypertension   . Osteopenia   . Aortic stenosis, moderate   . History of DVT (deep vein thrombosis)   . Coronary artery disease     s/p CABGx5 2007  . ALLERGIC RHINITIS   . BREAST CANCER 1991    left, 1991; B mastectomy  . CORONARY ARTERY DISEASE   . DIVERTICULOSIS   . DVT   . EROSIVE GASTRITIS   . GERD   . HYPERLIPIDEMIA   . HYPOTHYROIDISM   . Occlusion and stenosis of carotid artery without mention of cerebral infarction   . OSTEOPOROSIS   . RHINOSINUSITIS, RECURRENT     Past Surgical History   Procedure Laterality Date  . Colonoscopy    . Upper gastrointestinal endoscopy    . Mastectomy      left with reconstruction and lymph node excision  1992  . Mastectomy      right with reconstruction  . Tonsillectomy and adenoidectomy    . Appendectomy    . Vaginal hysterectomy      ovaries not excised  . Coronary artery bypass graft      5        2007  . Right ankle arthroscopy    . Revision reconstructed breast Left 02/2014    [facility] (plastics in [place])    Social History [name]  reports that she has never smoked. She has never used smokeless tobacco. She reports that she does not drink alcohol or use illicit drugs.  family history includes Breast cancer in her mother; Colon cancer in her sister and sister; Emphysema in her father; Lung disease in her mother.  Allergies  Allergen Reactions  . Clarithromycin Other (See Comments)    PATIENT UNSURE OF REACTION  . Codeine     REACTION: nausea  . Levofloxacin     REACTION: nausea  . Morphine     Nausea   . Naproxen     REACTION: nausea  . Penicillins     REACTION: RASH  . Pneumococcal Vaccines Rash    Small rash on arm at injection site.        PHYSICAL EXAMINATION: Vital signs: BP 136/64 mmHg  Pulse  72  Ht 5' 3.5" (1.613 m)  Wt 127 lb 4 oz (57.72 kg)  BMI 22.18 kg/m2  Constitutional: generally well-appearing, no acute distress Psychiatric: alert and oriented x3, cooperative. Anxious Eyes: extraocular movements intact, anicteric, conjunctiva pink Mouth: oral pharynx moist, no lesions Neck: supple no lymphadenopathy Cardiovascular: heart regular rate and rhythm, no murmur Lungs: clear to auscultation bilaterally Abdomen: soft, nontender, nondistended, no obvious ascites, no peritoneal signs, normal bowel sounds, no organomegaly Rectal: Omitted Extremities: no clubbing cyanosis or lower extremity edema bilaterally Skin: no lesions on visible extremities Neuro: No focal deficits. No  asterixis.    ASSESSMENT:  #1. Dyspepsia. Almost certainly functional and related to anxiety/depression issues. Doubt GERD #2. Anorexia and weight loss. Likely functional #3. Anxiety and depression. Certainly #4. Family history of colon cancer  PLAN:  #1. [name] discussion with her regarding anxiety and depression and the associated GI symptoms that can occur #2. Schedule upper endoscopy to rule out other causes for dyspepsia, anorexia, and weight loss. If negative provide reassurance. Likely stop PPI if negative.The nature of the procedure, as well as the risks, benefits, and alternatives were carefully and thoroughly reviewed with the patient. Ample time for discussion and questions allowed. The patient understood, was satisfied, and agreed to proceed. #3. Continue to work with Dr. Quay Burow regarding anxiety and depression, as well as insomnia #4. Routine screening colonoscopy sometime this year. Would wait until patient feels better

## 2016-01-22 DIAGNOSIS — F4321 Adjustment disorder with depressed mood: Secondary | ICD-10-CM | POA: Diagnosis not present

## 2016-01-28 ENCOUNTER — Ambulatory Visit (INDEPENDENT_AMBULATORY_CARE_PROVIDER_SITE_OTHER): Payer: Medicare Other

## 2016-01-28 DIAGNOSIS — J309 Allergic rhinitis, unspecified: Secondary | ICD-10-CM

## 2016-01-29 DIAGNOSIS — Z124 Encounter for screening for malignant neoplasm of cervix: Secondary | ICD-10-CM | POA: Diagnosis not present

## 2016-01-29 DIAGNOSIS — Z01419 Encounter for gynecological examination (general) (routine) without abnormal findings: Secondary | ICD-10-CM | POA: Diagnosis not present

## 2016-01-30 ENCOUNTER — Encounter: Payer: Self-pay | Admitting: Internal Medicine

## 2016-02-03 DIAGNOSIS — F4321 Adjustment disorder with depressed mood: Secondary | ICD-10-CM | POA: Diagnosis not present

## 2016-02-04 ENCOUNTER — Ambulatory Visit (INDEPENDENT_AMBULATORY_CARE_PROVIDER_SITE_OTHER): Payer: Medicare Other

## 2016-02-04 DIAGNOSIS — J309 Allergic rhinitis, unspecified: Secondary | ICD-10-CM

## 2016-02-05 ENCOUNTER — Telehealth: Payer: Self-pay | Admitting: Cardiovascular Disease

## 2016-02-05 NOTE — Telephone Encounter (Signed)
Patient calling to get advise from Dr. Burt Knack. Patient is seeing a counselor for depression and anxiety. Patient complaining of some SOB (not sure if this is related to her panic attacks), racing heart at times, weakness, and swelling in BLE. Patient states that these symptoms have been going on for a couple of months, since middle of January.  Counselor suggested patient call her cardiologist. Offered patient an appointment with PA or NP to be evaluated, but patient refused. Patient wants Dr. Burt Knack advisement, due to her valve problems. Will forward to Dr. Burt Knack and his nurse Ander Purpura.

## 2016-02-05 NOTE — Telephone Encounter (Signed)
New Message  Pt refused to disclose details. She states that it is too long to discuss. Please call back. Thanks

## 2016-02-06 MED ORDER — FUROSEMIDE 20 MG PO TABS: 20.0000 mg | ORAL_TABLET | Freq: Every day | ORAL | Status: DC

## 2016-02-06 NOTE — Telephone Encounter (Signed)
I spoke with the pt and made her aware of Dr Antionette Char recommendations. Rx sent into the pharmacy and Echo was moved up to 02/23/16.  I offered earlier appointments for Echo but the pt had other conflicts.  The pt currently has an appointment with Dr Burt Knack 04/05/16. Will leave OV as scheduled and await the pt's response to furosemide and echo results.

## 2016-02-06 NOTE — Telephone Encounter (Signed)
Chart reviewed. Last echo one year ago with mild-moderate AS and diastolic dysfunction. Would repeat echo and give trial of furosemide 20 mg daily. Arrange FOV to review echo and check response to Rx. thx

## 2016-02-06 NOTE — Telephone Encounter (Signed)
Left message on machine for pt to contact the office.   

## 2016-02-10 ENCOUNTER — Ambulatory Visit (AMBULATORY_SURGERY_CENTER): Payer: Medicare Other | Admitting: Internal Medicine

## 2016-02-10 ENCOUNTER — Encounter: Payer: Self-pay | Admitting: Internal Medicine

## 2016-02-10 VITALS — BP 165/83 | HR 64 | Temp 97.7°F | Resp 11 | Ht 63.5 in | Wt 127.0 lb

## 2016-02-10 DIAGNOSIS — Z951 Presence of aortocoronary bypass graft: Secondary | ICD-10-CM | POA: Diagnosis not present

## 2016-02-10 DIAGNOSIS — R1013 Epigastric pain: Secondary | ICD-10-CM

## 2016-02-10 DIAGNOSIS — K219 Gastro-esophageal reflux disease without esophagitis: Secondary | ICD-10-CM | POA: Diagnosis not present

## 2016-02-10 MED ORDER — SODIUM CHLORIDE 0.9 % IV SOLN: 500.0000 mL | INTRAVENOUS | Status: DC

## 2016-02-10 NOTE — Op Note (Signed)
[facility] Patient Name: [name] Procedure Date: 02/10/2016 9:04 AM MRN: [id number] Endoscopist: Docia Chuck. Henrene Pastor , MD Age: 75 Referring MD:  Date of Birth: 02-11-1941 Gender: Female Procedure:                Upper GI endoscopy Indications:              Dyspepsia Medicines:                Monitored Anesthesia Care Procedure:                Pre-Anesthesia Assessment:                           - Prior to the procedure, a History and Physical                            was performed, and patient medications and                            allergies were reviewed. The patient's tolerance of                            previous anesthesia was also reviewed. The risks                            and benefits of the procedure and the sedation                            options and risks were discussed with the patient.                            All questions were answered, and informed consent                            was obtained. Prior Anticoagulants: The patient has                            taken no previous anticoagulant or antiplatelet                            agents. ASA Grade Assessment: II - A patient with                            mild systemic disease. After reviewing the risks                            and benefits, the patient was deemed in                            satisfactory condition to undergo the procedure.                           After obtaining informed consent, the endoscope was  passed under direct vision. Throughout the                            procedure, the patient's blood pressure, pulse, and                            oxygen saturations were monitored continuously. The                            Model GIF-[id number] [id number]) scope was introduced                            through the mouth, and advanced to the second part                            of duodenum. The upper GI endoscopy was   accomplished without difficulty. The patient                            tolerated the procedure well. Scope In: Scope Out: Findings:      The examined esophagus was normal.      The entire examined stomach was normal.      The examined duodenum was normal. Complications:            No immediate complications. Estimated Blood Loss:     Estimated blood loss: none. Impression:               - Normal EGD                           - Functional dyspepsia. Recommendation:           - Patient has a contact number available for                            emergencies. The signs and symptoms of potential                            delayed complications were discussed with the                            patient. Return to normal activities tomorrow.                            Written discharge instructions were provided to the                            patient.                           - Resume previous diet.                           - Continue present medications.                           - Return to referring physician as previously  scheduled for ongoing treatment and monitoring of                            anxiety. Procedure Code(s):        --- Professional ---                           [phone], Esophagogastroduodenoscopy, flexible,                            transoral; diagnostic, including collection of                            specimen(s) by brushing or washing, when performed                            (separate procedure) CPT copyright 2016 [employer] Association. All rights reserved. Docia Chuck. Henrene Pastor, MD 02/10/2016 9:29:59 AM This report has been signed electronically. Number of Addenda: 0 Referring MD:      [name]

## 2016-02-10 NOTE — Patient Instructions (Signed)
YOU HAD AN ENDOSCOPIC PROCEDURE TODAY AT THE [facility]:   Refer to the procedure report that was given to you for any specific questions about what was found during the examination.  If the procedure report does not answer your questions, please call your gastroenterologist to clarify.  If you requested that your care partner not be given the details of your procedure findings, then the procedure report has been included in a sealed envelope for you to review at your convenience later.  YOU SHOULD EXPECT: Some feelings of bloating in the abdomen. Passage of more gas than usual.  Walking can help get rid of the air that was put into your GI tract during the procedure and reduce the bloating. If you had a lower endoscopy (such as a colonoscopy or flexible sigmoidoscopy) you may notice spotting of blood in your stool or on the toilet paper. If you underwent a bowel prep for your procedure, you may not have a normal bowel movement for a few days.  Please Note:  You might notice some irritation and congestion in your nose or some drainage.  This is from the oxygen used during your procedure.  There is no need for concern and it should clear up in a day or so.  SYMPTOMS TO REPORT IMMEDIATELY:    Following upper endoscopy (EGD)  Vomiting of blood or coffee ground material  New chest pain or pain under the shoulder blades  Painful or persistently difficult swallowing  New shortness of breath  Fever of 100F or higher  Black, tarry-looking stools  For urgent or emergent issues, a gastroenterologist can be reached at any hour by calling [phone].   DIET: Your first meal following the procedure should be a small meal and then it is ok to progress to your normal diet. Heavy or fried foods are harder to digest and may make you feel nauseous or bloated.  Likewise, meals heavy in dairy and vegetables can increase bloating.  Drink plenty of fluids but you should avoid alcoholic beverages  for 24 hours.  ACTIVITY:  You should plan to take it easy for the rest of today and you should NOT DRIVE or use heavy machinery until tomorrow (because of the sedation medicines used during the test).    FOLLOW UP: Our staff will call the number listed on your records the next business day following your procedure to check on you and address any questions or concerns that you may have regarding the information given to you following your procedure. If we do not reach you, we will leave a message.  However, if you are feeling well and you are not experiencing any problems, there is no need to return our call.  We will assume that you have returned to your regular daily activities without incident.  If any biopsies were taken you will be contacted by phone or by letter within the next 1-3 weeks.  Please call us at [phone] if you have not heard about the biopsies in 3 weeks.    SIGNATURES/CONFIDENTIALITY: You and/or your care partner have signed paperwork which will be entered into your electronic medical record.  These signatures attest to the fact that that the information above on your After Visit Summary has been reviewed and is understood.  Full responsibility of the confidentiality of this discharge information lies with you and/or your care-partner. 

## 2016-02-10 NOTE — Progress Notes (Signed)
Patient awakening,vss,report to rn 

## 2016-02-11 ENCOUNTER — Ambulatory Visit (INDEPENDENT_AMBULATORY_CARE_PROVIDER_SITE_OTHER): Payer: Medicare Other | Admitting: *Deleted

## 2016-02-11 ENCOUNTER — Telehealth: Payer: Self-pay | Admitting: Internal Medicine

## 2016-02-11 ENCOUNTER — Telehealth: Payer: Self-pay

## 2016-02-11 DIAGNOSIS — J309 Allergic rhinitis, unspecified: Secondary | ICD-10-CM | POA: Diagnosis not present

## 2016-02-11 NOTE — Telephone Encounter (Signed)
  Follow up Call-  Call back number 02/10/2016  Post procedure Call Back phone  # [phone]  Permission to leave phone message Yes     Patient was called for follow up after her procedure on 02/10/2016. No answer at the number given for follow up phone call. A message was left on the answering machine.

## 2016-02-12 NOTE — Telephone Encounter (Signed)
Patient states that the paper she received from our office the day of her office visit says she smokes. I asked patient to tell me exactly where it says that on the paper. I informed her that I looked on her social history and it states she is not a smoker and never has been one. Patient states she cannot find the paper and is not sure where she saw it but if she can find it again she will call back.

## 2016-02-16 ENCOUNTER — Encounter: Payer: Self-pay | Admitting: Internal Medicine

## 2016-02-16 ENCOUNTER — Ambulatory Visit (INDEPENDENT_AMBULATORY_CARE_PROVIDER_SITE_OTHER): Payer: Medicare Other | Admitting: Internal Medicine

## 2016-02-16 VITALS — BP 110/72 | HR 72 | Temp 97.7°F | Resp 16 | Wt 127.0 lb

## 2016-02-16 DIAGNOSIS — F419 Anxiety disorder, unspecified: Secondary | ICD-10-CM

## 2016-02-16 DIAGNOSIS — F418 Other specified anxiety disorders: Secondary | ICD-10-CM | POA: Diagnosis not present

## 2016-02-16 DIAGNOSIS — G4700 Insomnia, unspecified: Secondary | ICD-10-CM | POA: Diagnosis not present

## 2016-02-16 DIAGNOSIS — F32A Depression, unspecified: Secondary | ICD-10-CM

## 2016-02-16 DIAGNOSIS — I2510 Atherosclerotic heart disease of native coronary artery without angina pectoris: Secondary | ICD-10-CM | POA: Diagnosis not present

## 2016-02-16 DIAGNOSIS — F329 Major depressive disorder, single episode, unspecified: Secondary | ICD-10-CM

## 2016-02-16 DIAGNOSIS — K219 Gastro-esophageal reflux disease without esophagitis: Secondary | ICD-10-CM | POA: Diagnosis not present

## 2016-02-16 MED ORDER — FLUOXETINE HCL 10 MG PO CAPS: 10.0000 mg | ORAL_CAPSULE | Freq: Every day | ORAL | Status: DC

## 2016-02-16 NOTE — Patient Instructions (Addendum)
Stop the lexapro.  Start the prozac daily in the morning.  Talk to your pharmacist about the [place].   Take the protonix (stomach medication) every other day until you run out and then stop it.    Follow up in 4 weeks

## 2016-02-16 NOTE — Assessment & Plan Note (Signed)
[name] is not work as well anymore  -- possibly related to a different generic She will discuss with her pharmacist Can consider getting the ambien at a different pharmacy or trying a different medication, such as trazodone Discussed that she can not take more one pill nightly Will attempt to better control her anxiety/depression which will hopefully help

## 2016-02-16 NOTE — Assessment & Plan Note (Signed)
EGD normal - GERD symptoms are functional Will taper her off the protonix

## 2016-02-16 NOTE — Progress Notes (Signed)
Pre visit review using our clinic review tool, if applicable. No additional management support is needed unless otherwise documented below in the visit note. 

## 2016-02-16 NOTE — Progress Notes (Signed)
Subjective:    Patient ID: [name], female    DOB: 1940/12/06, 75 y.o.   MRN: [id number]  HPI She is here for follow-up.  Anxiety, depression: She is taking Lexapro 5 mg daily.  She is seeing a Social worker. She does not feel that the medication is working.  She has very low energy.  She is still nervous.  She skin starts crawling, she feels hot then cold and she can not be still.  She does not want to watch tv and wants to be alone.  She does mention later than she feels better - her appetite is not as bad and she is keeping food down better, so says maybe she is feeling better.   Her feel and legs started to swell and hurt.  She saw her cardiologist and was placed on lasix.  She will be having an echocardiogram.    Insomnia;  She is taking the ambien at night and it used to work well, but recently it has not been working.  She is only getting 5 hours of sleep.  She has been taking 1 pill when she goes to sleep and often 1/4 of a pill later in the night which helps her get an additional 2-3 hours.    GERD, nausea, increased burping: The symptoms started with increased anxiety and depression.  She had an EGD recently, which was normal.  She was diagnosed with functional dyspepsia likely related to anxiety and depression. He also felt her anorexia and weight loss were likely functional. She was advised to stop the PPI if the EGD was normal.  She has still been taking it.  She feels her appetite is slightly better and she is able to keep food down.   Medications and allergies reviewed with patient and updated if appropriate.  Patient Active Problem List   Diagnosis Date Noted  . Insomnia 01/16/2016  . Anxiety and depression 12/24/2015  . Seasonal and perennial allergic rhinitis 11/24/2015  . Asthma, mild intermittent, well-controlled 11/24/2015  . Occlusion and stenosis of carotid artery without mention of cerebral infarction 06/29/2011  . Hypothyroidism 11/11/2010  . Hyperlipidemia  11/11/2010  . Senile osteoporosis 11/11/2010  . RHINOSINUSITIS, RECURRENT 10/29/2008  . Personal history of malignant neoplasm of breast 03/27/2008  . GERD 03/27/2008  . DIVERTICULOSIS 03/27/2008  . Coronary atherosclerosis 08/23/2007  . Aortic stenosis, moderate 08/23/2007  . DVT 08/23/2007    Current Outpatient Prescriptions on File Prior to Visit  Medication Sig Dispense Refill  . aspirin 81 MG tablet Take 81 mg by mouth daily.      Marland Kitchen atorvastatin (LIPITOR) 20 MG tablet Take 1 tablet (20 mg total) by mouth daily. 90 tablet 1  . Calcium Carbonate (CALTRATE 600 PO) Take 2 tablets by mouth daily.      . chlorpheniramine (CHLOR-TRIMETON) 4 MG tablet Take 4 mg by mouth daily as needed for allergies.    . Coenzyme Q10 (CO Q-10 PO) Take 50 mg by mouth daily.      Marland Kitchen escitalopram (LEXAPRO) 5 MG tablet Take 1 tablet (5 mg total) by mouth daily. 30 tablet 5  . fluticasone (FLONASE) 50 MCG/ACT nasal spray 1-2 sprays each nostril once or twice daily (Patient taking differently: Place 1 spray into both nostrils daily. ) 16 g 12  . furosemide (LASIX) 20 MG tablet Take 1 tablet (20 mg total) by mouth daily. 90 tablet 3  . levothyroxine (SYNTHROID, LEVOTHROID) 75 MCG tablet Take 1 tablet (75 mcg total) by  mouth daily before breakfast. 90 tablet 3  . meloxicam (MOBIC) 15 MG tablet Take 1 tablet (15 mg total) by mouth daily. Must establish with NEW PCP for additional refills. 90 tablet 1  . metoprolol tartrate (LOPRESSOR) 25 MG tablet Take 1 tablet (25 mg total) by mouth 2 (two) times daily. 180 tablet 1  . multivitamin (THERAGRAN) per tablet Take 1 tablet by mouth daily.      . pantoprazole (PROTONIX) 40 MG tablet Take 1 tablet (40 mg total) by mouth daily. Take 30 minutes before a meal 30 tablet 3  . senna-docusate (SENOKOT-S) 8.6-50 MG per tablet Take 1 tablet by mouth daily.      Marland Kitchen zolpidem (AMBIEN) 10 MG tablet Take 1 tablet (10 mg total) by mouth at bedtime as needed for sleep. 90 tablet 0   No  current facility-administered medications on file prior to visit.    Past Medical History  Diagnosis Date  . Hypertension   . Osteopenia   . Aortic stenosis, moderate   . History of DVT (deep vein thrombosis)   . Coronary artery disease     s/p CABGx5 2007  . ALLERGIC RHINITIS   . BREAST CANCER 1991    left, 1991; B mastectomy  . CORONARY ARTERY DISEASE   . DIVERTICULOSIS   . DVT   . EROSIVE GASTRITIS   . GERD   . HYPERLIPIDEMIA   . HYPOTHYROIDISM   . Occlusion and stenosis of carotid artery without mention of cerebral infarction   . OSTEOPOROSIS   . RHINOSINUSITIS, RECURRENT   . Allergy     SEASONAL  . Depression   . Blood transfusion without reported diagnosis   . Cataract     BILATERAL-REMOVED  . Heart murmur     Past Surgical History  Procedure Laterality Date  . Colonoscopy    . Upper gastrointestinal endoscopy    . Mastectomy      left with reconstruction and lymph node excision  1992  . Mastectomy      right with reconstruction  . Tonsillectomy and adenoidectomy    . Appendectomy    . Vaginal hysterectomy      ovaries not excised  . Coronary artery bypass graft      5        2007  . Right ankle arthroscopy    . Revision reconstructed breast Left 02/2014    [facility]ard (plastics in [place])    Social History   Social History  . Marital Status: Divorced    Spouse Name: N/A  . Number of Children: N/A  . Years of Education: N/A   Social History Main Topics  . Smoking status: Never Smoker   . Smokeless tobacco: Never Used  . Alcohol Use: No  . Drug Use: No  . Sexual Activity: Not on file   Other Topics Concern  . 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 Colon cancer Sister   . Colon cancer Sister   . Breast cancer Mother   . Lung disease Mother   . Emphysema Father     Review of Systems    Constitutional: Positive for appetite change (decreased, improved) and fatigue. Negative for unexpected weight change.  Cardiovascular: Positive for leg swelling.  Gastrointestinal: Positive for nausea.       Jerrye Bushy  Psychiatric/Behavioral: Positive for sleep disturbance, dysphoric mood and decreased concentration. The patient is nervous/anxious and is hyperactive.        Objective:   Filed Vitals:   02/16/16 1348  BP: 110/72  Pulse: 72  Temp: 97.7 F (36.5 C)  Resp: 16   Filed Weights   02/16/16 1348  Weight: 127 lb (57.607 kg)   Body mass index is 22.14 kg/(m^2).   Physical Exam  Constitutional: She appears well-developed and well-nourished. No distress.  Skin: She is not diaphoretic.  Psychiatric: Her behavior is normal. Judgment and thought content normal.  Slightly anxious, mood not depressed        Assessment & Plan:   See Problem List for Assessment and Plan of chronic medical problems.  Follow up in one month

## 2016-02-16 NOTE — Assessment & Plan Note (Addendum)
She is seeing a therapist Not controlled on lexapro 5 mg daily and did not tolerate the 10 mg dose -- will discontinue.  I think the lexapro was helping slightly, but not enough Will start prozac 10 mg daily Discussed her seeing a psychiatrist for med management but she declined Follow up in 4 weeks

## 2016-02-17 ENCOUNTER — Telehealth: Payer: Self-pay | Admitting: *Deleted

## 2016-02-17 NOTE — Telephone Encounter (Signed)
Received call pt states she check around and [facility] get her Zolpidem  From the [employer] manufacturer  And for #30 it wuld cost $19.95. Pt is wanting rx call into [facility]...[name]

## 2016-02-18 ENCOUNTER — Ambulatory Visit (INDEPENDENT_AMBULATORY_CARE_PROVIDER_SITE_OTHER): Payer: Medicare Other

## 2016-02-18 DIAGNOSIS — J309 Allergic rhinitis, unspecified: Secondary | ICD-10-CM

## 2016-02-18 MED ORDER — ZOLPIDEM TARTRATE 10 MG PO TABS: 10.0000 mg | ORAL_TABLET | Freq: Every evening | ORAL | Status: DC | PRN

## 2016-02-18 NOTE — Telephone Encounter (Signed)
printed

## 2016-02-18 NOTE — Telephone Encounter (Signed)
Called pt no answer LMOM rx fax to [facility]...[name]

## 2016-02-23 ENCOUNTER — Ambulatory Visit (HOSPITAL_COMMUNITY): Payer: Medicare Other | Attending: Cardiovascular Disease

## 2016-02-23 ENCOUNTER — Other Ambulatory Visit: Payer: Self-pay

## 2016-02-23 DIAGNOSIS — I371 Nonrheumatic pulmonary valve insufficiency: Secondary | ICD-10-CM | POA: Diagnosis not present

## 2016-02-23 DIAGNOSIS — I350 Nonrheumatic aortic (valve) stenosis: Secondary | ICD-10-CM | POA: Insufficient documentation

## 2016-02-23 DIAGNOSIS — I2510 Atherosclerotic heart disease of native coronary artery without angina pectoris: Secondary | ICD-10-CM | POA: Insufficient documentation

## 2016-02-23 DIAGNOSIS — I340 Nonrheumatic mitral (valve) insufficiency: Secondary | ICD-10-CM | POA: Diagnosis not present

## 2016-02-23 DIAGNOSIS — E785 Hyperlipidemia, unspecified: Secondary | ICD-10-CM | POA: Insufficient documentation

## 2016-02-23 DIAGNOSIS — I119 Hypertensive heart disease without heart failure: Secondary | ICD-10-CM | POA: Insufficient documentation

## 2016-02-25 ENCOUNTER — Ambulatory Visit (INDEPENDENT_AMBULATORY_CARE_PROVIDER_SITE_OTHER): Payer: Medicare Other | Admitting: *Deleted

## 2016-02-25 DIAGNOSIS — F4321 Adjustment disorder with depressed mood: Secondary | ICD-10-CM | POA: Diagnosis not present

## 2016-02-25 DIAGNOSIS — J309 Allergic rhinitis, unspecified: Secondary | ICD-10-CM

## 2016-03-02 DIAGNOSIS — H04122 Dry eye syndrome of left lacrimal gland: Secondary | ICD-10-CM | POA: Diagnosis not present

## 2016-03-02 DIAGNOSIS — H04121 Dry eye syndrome of right lacrimal gland: Secondary | ICD-10-CM | POA: Diagnosis not present

## 2016-03-03 ENCOUNTER — Ambulatory Visit (INDEPENDENT_AMBULATORY_CARE_PROVIDER_SITE_OTHER): Payer: Medicare Other | Admitting: *Deleted

## 2016-03-03 ENCOUNTER — Telehealth: Payer: Self-pay | Admitting: Cardiovascular Disease

## 2016-03-03 DIAGNOSIS — J309 Allergic rhinitis, unspecified: Secondary | ICD-10-CM

## 2016-03-03 NOTE — Telephone Encounter (Signed)
I spoke with the [name] and she complains of bilateral foot numbness.  The [name] also has swelling in her feet and ankles and complains of pain in left ankle.  Dr Burt Knack did Rx Furosemide 20mg  daily on 02/06/16 due to symptoms of SOB and swelling.  The [name] has been taking this medication as prescribed but she does not feel like it has helped.  I did review preliminary results of echo with [name]. I made her aware that there are a number of things that could cause her lower extremity symptoms (i.e. Venous insufficiency, gout, neuropathy).  I advised the [name] that she should follow-up with PCP at this time. [name] agreed with plan.

## 2016-03-03 NOTE — Telephone Encounter (Signed)
New message    Patient calling - wants advisement from nurse on which direction to go.     Pt C/O medication issue:  1. Name of Medication: patient in her car - does not know the name of medication  - new medication   2. How are you currently taking this medication (dosage and times per day)? Patient does not have information   3. Are you having a reaction (difficulty breathing--STAT)? No   4. What is your medication issue?pain / swelling - concern she has never had swelling before.

## 2016-03-04 ENCOUNTER — Telehealth: Payer: Self-pay | Admitting: Internal Medicine

## 2016-03-04 NOTE — Telephone Encounter (Signed)
Patient Name: [name]  DOB: 1940/11/23    Initial Comment Caller states she has been having swelling in her feet legs and ankles-    Nurse Assessment      Guidelines    Guideline Title Affirmed Question Affirmed Notes       Final Disposition User   FINAL ATTEMPT MADE - message left [name], Therapist, sports, [name]

## 2016-03-04 NOTE — Telephone Encounter (Signed)
Please advise 

## 2016-03-04 NOTE — Telephone Encounter (Signed)
Spoke with pt. Have scheduled appt for 03/05/16

## 2016-03-04 NOTE — Telephone Encounter (Signed)
Should come in for evaluation

## 2016-03-05 ENCOUNTER — Ambulatory Visit (INDEPENDENT_AMBULATORY_CARE_PROVIDER_SITE_OTHER): Payer: Medicare Other | Admitting: Internal Medicine

## 2016-03-05 ENCOUNTER — Encounter: Payer: Self-pay | Admitting: Internal Medicine

## 2016-03-05 VITALS — BP 156/64 | HR 65 | Temp 97.4°F | Resp 16 | Wt 125.0 lb

## 2016-03-05 DIAGNOSIS — M949 Disorder of cartilage, unspecified: Secondary | ICD-10-CM | POA: Diagnosis not present

## 2016-03-05 DIAGNOSIS — I2510 Atherosclerotic heart disease of native coronary artery without angina pectoris: Secondary | ICD-10-CM | POA: Diagnosis not present

## 2016-03-05 DIAGNOSIS — R0789 Other chest pain: Secondary | ICD-10-CM

## 2016-03-05 DIAGNOSIS — R600 Localized edema: Secondary | ICD-10-CM

## 2016-03-05 NOTE — Assessment & Plan Note (Signed)
Likely related to venous insufficiency Decrease sodium - check labels Elevate legs when able Avoid prolonged standing or sitting [place] of veins b/l legs - unlikely dvt but will rule it out - also evaluate for insuffiency Continue lasix 20 mg daily

## 2016-03-05 NOTE — Assessment & Plan Note (Signed)
Likely inflammation Reassured Symptomatic treatment Already taking meloxicam

## 2016-03-05 NOTE — Progress Notes (Signed)
Subjective:    Patient ID: [name], female    DOB: 1941-02-15, 75 y.o.   MRN: [id number]  HPI She is here for an acute visit for feet swelling.  Her feet have been swelling for over one month.  She is taking the lasix daily in the morning and it is not working, although she does note increased urination with the medicaiton.  She has no swelling first thing in the morning, but the swelling accumulates during the day.   She has decreased her sodium intake - when she was having all the problems eating she was eating a lot of soup, but she has stopped that.  She takes 1/4 of a meloxicam daily and that is not new.   She had a recent echo with normal EF.  She is elevating her legs when she sits.     She noticed pain at the bottom of the sternum the other day.  She was just itching the area and noticed it was tender to palpation.  She has not noticed that before.    She is also doing better on the prozac.  Her anxiety if much better.   Medications and allergies reviewed with patient and updated if appropriate.  Patient Active Problem List   Diagnosis Date Noted  . Insomnia 01/16/2016  . Anxiety and depression 12/24/2015  . Seasonal and perennial allergic rhinitis 11/24/2015  . Asthma, mild intermittent, well-controlled 11/24/2015  . Occlusion and stenosis of carotid artery without mention of cerebral infarction 06/29/2011  . Hypothyroidism 11/11/2010  . Hyperlipidemia 11/11/2010  . Senile osteoporosis 11/11/2010  . RHINOSINUSITIS, RECURRENT 10/29/2008  . Personal history of malignant neoplasm of breast 03/27/2008  . GERD 03/27/2008  . DIVERTICULOSIS 03/27/2008  . Coronary atherosclerosis 08/23/2007  . Aortic stenosis, moderate 08/23/2007  . DVT 08/23/2007    Current Outpatient Prescriptions on File Prior to Visit  Medication Sig Dispense Refill  . aspirin 81 MG tablet Take 81 mg by mouth daily.      Marland Kitchen atorvastatin (LIPITOR) 20 MG tablet Take 1 tablet (20 mg total) by mouth  daily. 90 tablet 1  . Calcium Carbonate (CALTRATE 600 PO) Take 2 tablets by mouth daily.      . chlorpheniramine (CHLOR-TRIMETON) 4 MG tablet Take 4 mg by mouth daily as needed for allergies.    . Coenzyme Q10 (CO Q-10 PO) Take 50 mg by mouth daily.      Marland Kitchen FLUoxetine (PROZAC) 10 MG capsule Take 1 capsule (10 mg total) by mouth daily. 30 capsule 3  . fluticasone (FLONASE) 50 MCG/ACT nasal spray 1-2 sprays each nostril once or twice daily (Patient taking differently: Place 1 spray into both nostrils daily. ) 16 g 12  . furosemide (LASIX) 20 MG tablet Take 1 tablet (20 mg total) by mouth daily. 90 tablet 3  . levothyroxine (SYNTHROID, LEVOTHROID) 75 MCG tablet Take 1 tablet (75 mcg total) by mouth daily before breakfast. 90 tablet 3  . meloxicam (MOBIC) 15 MG tablet Take 1 tablet (15 mg total) by mouth daily. Must establish with NEW PCP for additional refills. 90 tablet 1  . metoprolol tartrate (LOPRESSOR) 25 MG tablet Take 1 tablet (25 mg total) by mouth 2 (two) times daily. 180 tablet 1  . multivitamin (THERAGRAN) per tablet Take 1 tablet by mouth daily.      Marland Kitchen senna-docusate (SENOKOT-S) 8.6-50 MG per tablet Take 1 tablet by mouth daily.      Marland Kitchen zolpidem (AMBIEN) 10 MG tablet Take  1 tablet (10 mg total) by mouth at bedtime as needed for sleep. 90 tablet 0   No current facility-administered medications on file prior to visit.    Past Medical History  Diagnosis Date  . Hypertension   . Osteopenia   . Aortic stenosis, moderate   . History of DVT (deep vein thrombosis)   . Coronary artery disease     s/p CABGx5 2007  . ALLERGIC RHINITIS   . BREAST CANCER 1991    left, 1991; B mastectomy  . CORONARY ARTERY DISEASE   . DIVERTICULOSIS   . DVT   . EROSIVE GASTRITIS   . GERD   . HYPERLIPIDEMIA   . HYPOTHYROIDISM   . Occlusion and stenosis of carotid artery without mention of cerebral infarction   . OSTEOPOROSIS   . RHINOSINUSITIS, RECURRENT   . Allergy     SEASONAL  . Depression   .  Blood transfusion without reported diagnosis   . Cataract     BILATERAL-REMOVED  . Heart murmur     Past Surgical History  Procedure Laterality Date  . Colonoscopy    . Upper gastrointestinal endoscopy    . Mastectomy      left with reconstruction and lymph node excision  1992  . Mastectomy      right with reconstruction  . Tonsillectomy and adenoidectomy    . Appendectomy    . Vaginal hysterectomy      ovaries not excised  . Coronary artery bypass graft      5        2007  . Right ankle arthroscopy    . Revision reconstructed breast Left 02/2014    [facility]ard (plastics in [place])    Social History   Social History  . Marital Status: Divorced    Spouse Name: N/A  . Number of Children: N/A  . Years of Education: N/A   Social History Main Topics  . Smoking status: Never Smoker   . Smokeless tobacco: Never Used  . Alcohol Use: No  . Drug Use: No  . Sexual Activity: Not on file   Other Topics Concern  . 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 Colon cancer Sister   . Colon cancer Sister   . Breast cancer Mother   . Lung disease Mother   . Emphysema Father     Review of Systems  Constitutional: Positive for fatigue.  Respiratory: Negative for shortness of breath.   Cardiovascular: Positive for palpitations (anxiety) and leg swelling. Negative for chest pain.  Genitourinary:       Increased urination after lasix  Neurological: Negative for light-headedness.       Objective:   Filed Vitals:   03/05/16 1640  BP: 156/64  Pulse: 65  Temp: 97.4 F (36.3 C)  Resp: 16   Filed Weights   03/05/16 1640  Weight: 125 lb (56.7 kg)   Body mass index is 21.79 kg/(m^2).   Physical Exam Constitutional: Appears well-developed and well-nourished. No distress.   Cardiovascular: Normal rate, regular rhythm and normal  heart sounds.   3/6 systolic and 1/6 diastolic murmur heard. Chest: xiphoid process mildly tender with palpation Pulmonary/Chest: Effort normal and breath sounds normal. No respiratory distress. No wheezes.  Ext/Vascular: mild edema left ankle, no edema right ankle Skin: no LE skin  changes      Assessment & Plan:    See Problem List for Assessment and Plan of chronic medical problems.

## 2016-03-05 NOTE — Progress Notes (Signed)
Pre visit review using our clinic review tool, if applicable. No additional management support is needed unless otherwise documented below in the visit note. 

## 2016-03-05 NOTE — Patient Instructions (Signed)
We will check an ultrasound of your legs to evaluate your veins.    Test(s) ordered today. Your results will be released to [place] (or called to you) after review, usually within 72hours after test completion. If any changes need to be made, you will be notified at that same time.  Decrease sodium in your diet.  Keep active. Elevate your legs when you sit.    Venous Stasis or Chronic Venous Insufficiency Chronic venous insufficiency, also called venous stasis, is a condition that affects the veins in the legs. The condition prevents blood from being pumped through these veins effectively. Blood may no longer be pumped effectively from the legs back to the heart. This condition can range from mild to severe. With proper treatment, you should be able to continue with an active life. CAUSES  Chronic venous insufficiency occurs when the vein walls become stretched, weakened, or damaged or when valves within the vein are damaged. Some common causes of this include:  High blood pressure inside the veins (venous hypertension).  Increased blood pressure in the leg veins from long periods of sitting or standing.  A blood clot that blocks blood flow in a vein (deep vein thrombosis).  Inflammation of a superficial vein (phlebitis) that causes a blood clot to form. RISK FACTORS Various things can make you more likely to develop chronic venous insufficiency, including:  Family history of this condition.  Obesity.  Pregnancy.  Sedentary lifestyle.  Smoking.  Jobs requiring long periods of standing or sitting in one place.  Being a certain age. Women in their 50s and 35s and men in their 6s are more likely to develop this condition. SIGNS AND SYMPTOMS  Symptoms may include:   Varicose veins.  Skin breakdown or ulcers.  Reddened or discolored skin on the leg.  Brown, smooth, tight, and painful skin just above the ankle, usually on the inside surface  (lipodermatosclerosis).  Swelling. DIAGNOSIS  To diagnose this condition, your health care provider will take a medical history and do a physical exam. The following tests may be ordered to confirm the diagnosis:  Duplex ultrasound--A procedure that produces a picture of a blood vessel and nearby organs and also provides information on blood flow through the blood vessel.  Plethysmography--A procedure that tests blood flow.  A venogram, or venography--A procedure used to look at the veins using X-ray and dye. TREATMENT The goals of treatment are to help you return to an active life and to minimize pain or disability. Treatment will depend on the severity of the condition. Medical procedures may be needed for severe cases. Treatment options may include:   Use of compression stockings. These can help with symptoms and lower the chances of the problem getting worse, but they do not cure the problem.  Sclerotherapy--A procedure involving an injection of a material that "dissolves" the damaged veins. Other veins in the network of blood vessels take over the function of the damaged veins.  Surgery to remove the vein or cut off blood flow through the vein (vein stripping or laser ablation surgery).  Surgery to repair a valve. HOME CARE INSTRUCTIONS   Wear compression stockings as directed by your health care provider.  Only take over-the-counter or prescription medicines for pain, discomfort, or fever as directed by your health care provider.  Follow up with your health care provider as directed. SEEK MEDICAL CARE IF:   You have redness, swelling, or increasing pain in the affected area.  You see a red streak or  line that extends up or down from the affected area.  You have a breakdown or loss of skin in the affected area, even if the breakdown is small.  You have an injury to the affected area. SEEK IMMEDIATE MEDICAL CARE IF:   You have an injury and open wound in the affected  area.  Your pain is severe and does not improve with medicine.  You have sudden numbness or weakness in the foot or ankle below the affected area, or you have trouble moving your foot or ankle.  You have a fever or persistent symptoms for more than 2-3 days.  You have a fever and your symptoms suddenly get worse. MAKE SURE YOU:   Understand these instructions.  Will watch your condition.  Will get help right away if you are not doing well or get worse.   This information is not intended to replace advice given to you by your health care provider. Make sure you discuss any questions you have with your health care provider.   Document Released: 03/07/2007 Document Revised: 08/22/2013 Document Reviewed: 07/09/2013 Elsevier Interactive Patient Education [employer].

## 2016-03-08 ENCOUNTER — Encounter: Payer: Self-pay | Admitting: Cardiovascular Disease

## 2016-03-08 NOTE — Telephone Encounter (Signed)
This encounter was created in error - please disregard.

## 2016-03-08 NOTE — Telephone Encounter (Signed)
F/u  Pt returning Rn phone call- test results. Please call back and discuss.

## 2016-03-09 ENCOUNTER — Ambulatory Visit (HOSPITAL_COMMUNITY)
Admission: RE | Admit: 2016-03-09 | Discharge: 2016-03-09 | Disposition: A | Discharge status: Home / Self Care | Payer: Medicare Other | Source: Ambulatory Visit | Attending: Internal Medicine | Admitting: Internal Medicine

## 2016-03-09 DIAGNOSIS — E785 Hyperlipidemia, unspecified: Secondary | ICD-10-CM | POA: Diagnosis not present

## 2016-03-09 DIAGNOSIS — I10 Essential (primary) hypertension: Secondary | ICD-10-CM | POA: Insufficient documentation

## 2016-03-09 DIAGNOSIS — R600 Localized edema: Secondary | ICD-10-CM | POA: Insufficient documentation

## 2016-03-09 DIAGNOSIS — K219 Gastro-esophageal reflux disease without esophagitis: Secondary | ICD-10-CM | POA: Diagnosis not present

## 2016-03-10 ENCOUNTER — Ambulatory Visit (INDEPENDENT_AMBULATORY_CARE_PROVIDER_SITE_OTHER): Payer: Medicare Other | Admitting: *Deleted

## 2016-03-10 DIAGNOSIS — J309 Allergic rhinitis, unspecified: Secondary | ICD-10-CM

## 2016-03-12 ENCOUNTER — Ambulatory Visit: Payer: Medicare Other

## 2016-03-12 ENCOUNTER — Ambulatory Visit: Payer: Medicare Other | Admitting: Internal Medicine

## 2016-03-15 ENCOUNTER — Ambulatory Visit: Payer: Medicare Other | Admitting: Internal Medicine

## 2016-03-17 ENCOUNTER — Ambulatory Visit (INDEPENDENT_AMBULATORY_CARE_PROVIDER_SITE_OTHER): Payer: Medicare Other | Admitting: *Deleted

## 2016-03-17 DIAGNOSIS — J309 Allergic rhinitis, unspecified: Secondary | ICD-10-CM

## 2016-03-24 ENCOUNTER — Ambulatory Visit (INDEPENDENT_AMBULATORY_CARE_PROVIDER_SITE_OTHER): Payer: Medicare Other

## 2016-03-24 DIAGNOSIS — J309 Allergic rhinitis, unspecified: Secondary | ICD-10-CM | POA: Diagnosis not present

## 2016-03-30 ENCOUNTER — Telehealth: Payer: Self-pay | Admitting: Internal Medicine

## 2016-03-30 DIAGNOSIS — R600 Localized edema: Secondary | ICD-10-CM

## 2016-03-30 NOTE — Telephone Encounter (Signed)
Patient states she is still having swelling in her left ankle, foot and leg.  Patient does not know what to do.  Would like a follow up call in regards?

## 2016-03-31 ENCOUNTER — Ambulatory Visit (INDEPENDENT_AMBULATORY_CARE_PROVIDER_SITE_OTHER): Payer: Medicare Other

## 2016-03-31 DIAGNOSIS — J309 Allergic rhinitis, unspecified: Secondary | ICD-10-CM | POA: Diagnosis not present

## 2016-03-31 NOTE — Telephone Encounter (Signed)
Please advise 

## 2016-03-31 NOTE — Telephone Encounter (Signed)
Spoke with pt to inform. Pt would like to have an X-ray done of both feet as she is having pain in her right foot and feels like she may need to see a foot specialist. Labs ordered.

## 2016-03-31 NOTE — Telephone Encounter (Signed)
Is the swelling still going away over night?  She can take two lasix daily for two days then decrease back to one pill daily (20 mg daily)  Have her get blood work done next week to check her kidney function.

## 2016-04-01 ENCOUNTER — Encounter: Payer: Self-pay | Admitting: Internal Medicine

## 2016-04-01 NOTE — Telephone Encounter (Signed)
xrays ordered

## 2016-04-02 ENCOUNTER — Telehealth: Payer: Self-pay | Admitting: Cardiovascular Disease

## 2016-04-02 DIAGNOSIS — R609 Edema, unspecified: Secondary | ICD-10-CM

## 2016-04-02 NOTE — Telephone Encounter (Signed)
I spoke with the pt and her PCP increased Furosemide for 2 days and then advised that she have lab work early next week to check her kidney function.  The pt is scheduled for fasting lab work with our office on 04/05/16.  I will add a BMP to this lab draw.

## 2016-04-02 NOTE — Telephone Encounter (Signed)
New Message  Pt called states that her PCP has upped her dosage. Pt request a call back to discuss if she will be tested for Kidney function to saveher from having to draw blood twice. Please call

## 2016-04-05 ENCOUNTER — Encounter: Payer: Self-pay | Admitting: Cardiovascular Disease

## 2016-04-05 ENCOUNTER — Other Ambulatory Visit (INDEPENDENT_AMBULATORY_CARE_PROVIDER_SITE_OTHER): Payer: Medicare Other

## 2016-04-05 ENCOUNTER — Other Ambulatory Visit (HOSPITAL_COMMUNITY): Payer: Medicare Other

## 2016-04-05 ENCOUNTER — Ambulatory Visit (INDEPENDENT_AMBULATORY_CARE_PROVIDER_SITE_OTHER): Payer: Medicare Other | Admitting: Cardiovascular Disease

## 2016-04-05 VITALS — BP 140/66 | HR 56 | Ht 65.5 in | Wt 126.4 lb

## 2016-04-05 DIAGNOSIS — R609 Edema, unspecified: Secondary | ICD-10-CM

## 2016-04-05 DIAGNOSIS — I359 Nonrheumatic aortic valve disorder, unspecified: Secondary | ICD-10-CM | POA: Diagnosis not present

## 2016-04-05 DIAGNOSIS — I10 Essential (primary) hypertension: Secondary | ICD-10-CM

## 2016-04-05 DIAGNOSIS — I2510 Atherosclerotic heart disease of native coronary artery without angina pectoris: Secondary | ICD-10-CM | POA: Diagnosis not present

## 2016-04-05 DIAGNOSIS — I350 Nonrheumatic aortic (valve) stenosis: Secondary | ICD-10-CM | POA: Diagnosis not present

## 2016-04-05 DIAGNOSIS — E785 Hyperlipidemia, unspecified: Secondary | ICD-10-CM | POA: Diagnosis not present

## 2016-04-05 LAB — LIPID PANEL
CHOLESTEROL: 131 mg/dL (ref 125–200)
HDL: 52 mg/dL (ref >=46)
LDL Cholesterol: 66 mg/dL (ref <130)
Total CHOL/HDL Ratio: 2.5 Ratio (ref <=5.0)
Triglycerides: 65 mg/dL (ref <150)
VLDL: 13 mg/dL (ref <30)

## 2016-04-05 LAB — HEPATIC FUNCTION PANEL
ALT: 21 U/L (ref 6–29)
AST: 26 U/L (ref 10–35)
Albumin: 4 g/dL (ref 3.6–5.1)
Alkaline Phosphatase: 63 U/L (ref 33–130)
BILIRUBIN DIRECT: 0.3 mg/dL — AB (ref <=0.2)
BILIRUBIN INDIRECT: 1.1 mg/dL (ref 0.2–1.2)
Total Bilirubin: 1.4 mg/dL — ABNORMAL HIGH (ref 0.2–1.2)
Total Protein: 6.2 g/dL (ref 6.1–8.1)

## 2016-04-05 LAB — BASIC METABOLIC PANEL
BUN: 17 mg/dL (ref 7–25)
CALCIUM: 8.9 mg/dL (ref 8.6–10.4)
CO2: 27 mmol/L (ref 20–31)
Chloride: 105 mmol/L (ref 98–110)
Creat: 0.7 mg/dL (ref 0.60–0.93)
GLUCOSE: 82 mg/dL (ref 65–99)
Potassium: 4.1 mmol/L (ref 3.5–5.3)
Sodium: 139 mmol/L (ref 135–146)

## 2016-04-05 NOTE — Progress Notes (Signed)
Cardiology Office Note Date:  04/05/2016   ID:  [name], [name] 12-06-1940, MRN [id number]  PCP:  [name], MD  Cardiologist:  [name], MD    Chief Complaint  Patient presents with  . Fatigue   History of Present Illness: [name] is a 75 y.o. female who presents for follow-up evaluation. The patient has coronary artery disease with history of CABG in 2007. She underwent cardiac catheterization in 2011 demonstrating patency of all of her bypass grafts and preserved LV function. Last Myoview scan in March 2016 showed no ischemia.   Her biggest complaint is left leg swelling. She has had a venous duplex scan done which was negative for DVT. She has started on furosemide 20 mg daily without much improvement. Her saphenous vein graft harvest was actually from the right leg. She has tried leg elevation but hasn't noted much improvement. She complains of tenderness in her ankle and foot. She is going to see a podiatrist in the near future. She denies orthopnea, PND, or chest pain. She admits to mild exertional dyspnea but doesn't feel particularly limited. She stays active with no other exertional symptoms.  Past Medical History  Diagnosis Date  . Hypertension   . Osteopenia   . Aortic stenosis, moderate   . History of DVT (deep vein thrombosis)   . Coronary artery disease     s/p CABGx5 2007  . ALLERGIC RHINITIS   . BREAST CANCER 1991    left, 1991; B mastectomy  . CORONARY ARTERY DISEASE   . DIVERTICULOSIS   . DVT   . EROSIVE GASTRITIS   . GERD   . HYPERLIPIDEMIA   . HYPOTHYROIDISM   . Occlusion and stenosis of carotid artery without mention of cerebral infarction   . OSTEOPOROSIS   . RHINOSINUSITIS, RECURRENT   . Allergy     SEASONAL  . Depression   . Blood transfusion without reported diagnosis   . Cataract     BILATERAL-REMOVED  . Heart murmur     Past Surgical History  Procedure Laterality Date  . Colonoscopy    . Upper gastrointestinal  endoscopy    . Mastectomy      left with reconstruction and lymph node excision  1992  . Mastectomy      right with reconstruction  . Tonsillectomy and adenoidectomy    . Appendectomy    . Vaginal hysterectomy      ovaries not excised  . Coronary artery bypass graft      5        2007  . Right ankle arthroscopy    . Revision reconstructed breast Left 02/2014    [facility]ard (plastics in [place])    Current Outpatient Prescriptions  Medication Sig Dispense Refill  . aspirin 81 MG tablet Take 81 mg by mouth daily.      Marland Kitchen atorvastatin (LIPITOR) 20 MG tablet Take 1 tablet (20 mg total) by mouth daily. 90 tablet 1  . Calcium Carbonate (CALTRATE 600 PO) Take 2 tablets by mouth daily.      . chlorpheniramine (CHLOR-TRIMETON) 4 MG tablet Take 4 mg by mouth daily as needed for allergies.    . Coenzyme Q10 (CO Q-10 PO) Take 50 mg by mouth daily.      Marland Kitchen FLUoxetine (PROZAC) 10 MG capsule Take 1 capsule (10 mg total) by mouth daily. 30 capsule 3  . fluticasone (FLONASE) 50 MCG/ACT nasal spray Place 1 spray into both nostrils daily.    Marland Kitchen  furosemide (LASIX) 20 MG tablet Take 1 tablet (20 mg total) by mouth daily. 90 tablet 3  . levothyroxine (SYNTHROID, LEVOTHROID) 75 MCG tablet Take 1 tablet (75 mcg total) by mouth daily before breakfast. 90 tablet 3  . meloxicam (MOBIC) 15 MG tablet Take 1 tablet (15 mg total) by mouth daily. Must establish with NEW PCP for additional refills. 90 tablet 1  . metoprolol tartrate (LOPRESSOR) 25 MG tablet Take 1 tablet (25 mg total) by mouth 2 (two) times daily. 180 tablet 1  . multivitamin (THERAGRAN) per tablet Take 1 tablet by mouth daily.      Marland Kitchen senna-docusate (SENOKOT-S) 8.6-50 MG per tablet Take 1 tablet by mouth daily.      Marland Kitchen zolpidem (AMBIEN) 10 MG tablet Take 1 tablet (10 mg total) by mouth at bedtime as needed for sleep. 90 tablet 0   No current facility-administered medications for this visit.    Allergies:   Clarithromycin; Codeine; Levofloxacin; Morphine;  Naproxen; Penicillins; and Pneumococcal vaccines   Social History:  The patient  reports that she has never smoked. She has never used smokeless tobacco. She reports that she does not drink alcohol or use illicit drugs.   Family History:  The patient's  family history includes Breast cancer in her mother; Colon cancer in her sister and sister; Emphysema in her father; Lung disease in her mother.    ROS:  Please see the history of present illness.  Otherwise, review of systems is positive for Leg swelling, hearing loss, visual disturbance, depression, leg pain, nausea, anxiety, joint swelling.  All other systems are reviewed and negative.    PHYSICAL EXAM: VS:  BP 140/66 mmHg  Pulse 56  Ht 5' 5.5" (1.664 m)  Wt 126 lb 6.4 oz (57.335 kg)  BMI 20.71 kg/m2 , BMI Body mass index is 20.71 kg/(m^2). GEN: Well nourished, well developed, in no acute distress HEENT: normal Neck: no JVD, no masses. Bilateral carotid bruits Cardiac: RRR with grade 2/6 systolic murmur at the LSB                Respiratory:  clear to auscultation bilaterally, normal work of breathing GI: soft, nontender, nondistended, + BS MS: no deformity or atrophy Ext: 1+ left pretibial edema, pedal pulses 2+= bilaterally Skin: warm and dry, no rash Neuro:  Strength and sensation are intact Psych: euthymic mood, full affect  EKG:  EKG is not ordered today.  Recent Labs: 12/24/2015: ALT 22; TSH 1.33 01/17/2016: BUN 10; Creatinine, Ser 0.75; Hemoglobin 11.8*; Platelets 340; Potassium 3.5; Sodium 142   Lipid Panel     Component Value Date/Time   CHOL 133 01/30/2015 1036   TRIG 62.0 01/30/2015 1036   HDL 45.80 01/30/2015 1036   CHOLHDL 3 01/30/2015 1036   VLDL 12.4 01/30/2015 1036   LDLCALC 75 01/30/2015 1036      Wt Readings from Last 3 Encounters:  04/05/16 126 lb 6.4 oz (57.335 kg)  03/05/16 125 lb (56.7 kg)  02/16/16 127 lb (57.607 kg)     Cardiac Studies Reviewed: 2D Echo 02-23-2016: Study Conclusions  -  Left ventricle: The cavity size was normal. Wall thickness was  increased in a pattern of mild LVH. Systolic function was normal.  The estimated ejection fraction was in the range of 60% to 65%.  Wall motion was normal; there were no regional wall motion  abnormalities. Doppler parameters are consistent with a  reversible restrictive pattern, indicative of decreased left  ventricular diastolic compliance and/or increased left  atrial  pressure (grade 3 diastolic dysfunction). - Aortic valve: There was mild stenosis. - Mitral valve: Mildly calcified annulus. There was mild  regurgitation. - Pulmonic valve: There was moderate regurgitation. - Pulmonary arteries: Systolic pressure was mildly increased. PA  peak pressure: 37 mm Hg (S).  Carotid Duplex 02/07/2015: <40% carotid stenosis bilaterally 2 year follow-up recommend  Myoview Scan 02/10/2015: Impression Exercise Capacity: Lexiscan with no exercise. BP Response: Normal blood pressure response. Clinical Symptoms: There is chest tightness and dyspnea ECG Impression: No significant ST segment change suggestive of ischemia. Comparison with Prior Nuclear Study: No images to compare  Overall Impression: Normal stress nuclear study.  LV Ejection Fraction: 81%. LV Wall Motion: NL LV Function; NL Wall Motion  ASSESSMENT AND PLAN: 1.  CAD, native vessel, no angina. Pt is stable. Reviewed meds today and lifestyle issues for secondary prevention. Will see back in 6 months.  2. Aortic stenosis: Recent echo reviewed with mild aortic stenosis. Will repeat an echo in one year. Pt with diastolic dysfunction, treated with furosemide and antihypertensive Rx.   3. HTN, essential: controlled  4. Hyperlipidemia: controlled on current Rx with atorvastatin. Labs drawn today.  5. Carotid stenosis: mild by most recent duplex. Clinical FU recommended.   6. Leg swelling: reviewed lifestyle modification, i.e. Leg elevation, sodium  restriction. Wrote Rx for light compression thigh-high stockings. Suspect venous insufficiency. If continued problems consider [facility]VS referral.   Current medicines are reviewed with the patient today.  The patient does not have concerns regarding medicines.  Labs/ tests ordered today include:  No orders of the defined types were placed in this encounter.   Disposition:   FU 6 months  Signed, [name], MD  04/05/2016 1:19 PM    [facility] [place], [place], [place]  [address] Phone: [phone]; Fax: [phone]

## 2016-04-05 NOTE — Patient Instructions (Signed)
Medication Instructions:  Your physician recommends that you continue on your current medications as directed. Please refer to the Current Medication list given to you today.  Labwork: No new orders.   Testing/Procedures: No new orders.   Follow-Up: Your physician wants you to follow-up in: 6 MONTHS with Dr Burt Knack.  You will receive a reminder letter in the mail two months in advance. If you don't receive a letter, please call our office to schedule the follow-up appointment.   Any Other Special Instructions Will Be Listed Below (If Applicable).  Order for 20-30 mmHg compression stockings given to patient.  Please limit the salt in your diet as this can cause swelling.      If you need a refill on your cardiac medications before your next appointment, please call your pharmacy.

## 2016-04-05 NOTE — Addendum Note (Signed)
Addended by: Stephannie Peters on: 04/05/2016 10:59 AM   Modules accepted: Orders

## 2016-04-07 ENCOUNTER — Telehealth: Payer: Self-pay | Admitting: Cardiovascular Disease

## 2016-04-07 ENCOUNTER — Other Ambulatory Visit: Payer: Self-pay | Admitting: Cardiovascular Disease

## 2016-04-07 ENCOUNTER — Ambulatory Visit (INDEPENDENT_AMBULATORY_CARE_PROVIDER_SITE_OTHER)
Admission: RE | Admit: 2016-04-07 | Discharge: 2016-04-07 | Disposition: A | Discharge status: Home / Self Care | Payer: Medicare Other | Source: Ambulatory Visit | Attending: Internal Medicine | Admitting: Internal Medicine

## 2016-04-07 ENCOUNTER — Ambulatory Visit (INDEPENDENT_AMBULATORY_CARE_PROVIDER_SITE_OTHER): Payer: Medicare Other

## 2016-04-07 DIAGNOSIS — R600 Localized edema: Secondary | ICD-10-CM | POA: Diagnosis not present

## 2016-04-07 DIAGNOSIS — R17 Unspecified jaundice: Secondary | ICD-10-CM

## 2016-04-07 DIAGNOSIS — J309 Allergic rhinitis, unspecified: Secondary | ICD-10-CM

## 2016-04-07 DIAGNOSIS — M7989 Other specified soft tissue disorders: Secondary | ICD-10-CM | POA: Diagnosis not present

## 2016-04-07 NOTE — Telephone Encounter (Signed)
Pt aware of lab results by phone. BMP drawn for PCP.  The pt will have repeat liver function on 05/12/16.

## 2016-04-07 NOTE — Telephone Encounter (Signed)
[name] is returning your call

## 2016-04-07 NOTE — Telephone Encounter (Signed)
I spoke with the pt and she is currently grocery shopping in [employer].  She will call the office when she returns home.

## 2016-04-08 ENCOUNTER — Ambulatory Visit: Payer: Medicare Other | Admitting: Cardiovascular Disease

## 2016-04-09 ENCOUNTER — Telehealth: Payer: Self-pay | Admitting: Internal Medicine

## 2016-04-09 DIAGNOSIS — J309 Allergic rhinitis, unspecified: Secondary | ICD-10-CM

## 2016-04-13 NOTE — Telephone Encounter (Signed)
Allergy Serum Extract Date Mixed: 04/09/16  KCW aware Vial: 2 Strength: 1:10 Here/Mail/Pick Up: here Mixed By: tbs Last OV: 11/24/15 Pending OV: 11/24/16

## 2016-04-14 ENCOUNTER — Ambulatory Visit (INDEPENDENT_AMBULATORY_CARE_PROVIDER_SITE_OTHER): Payer: Medicare Other | Admitting: *Deleted

## 2016-04-14 DIAGNOSIS — J309 Allergic rhinitis, unspecified: Secondary | ICD-10-CM

## 2016-04-16 ENCOUNTER — Telehealth: Payer: Self-pay | Admitting: Internal Medicine

## 2016-04-16 NOTE — Telephone Encounter (Signed)
Spoke with pt. She states she will try taking it every other day and see how she feels.

## 2016-04-16 NOTE — Telephone Encounter (Signed)
Please advise 

## 2016-04-16 NOTE — Telephone Encounter (Signed)
Please follow up with patient.  States its time for prozac to be refilled.  Patient states she is feeling fine and everything has gotten better at home.  Would like to know what Dr. Quay Burow thought about her continuing the medication.

## 2016-04-16 NOTE — Telephone Encounter (Signed)
It is up to her -- she is feeling well and I hate to have her stop the medication too early.  She may want to continue it for another few weeks, but that is up to her.  If she wants to stop it now or in the near future she does not need to taper off of it because she is on a low dose - she can just stop it.

## 2016-04-21 ENCOUNTER — Ambulatory Visit (INDEPENDENT_AMBULATORY_CARE_PROVIDER_SITE_OTHER): Payer: Medicare Other | Admitting: *Deleted

## 2016-04-21 DIAGNOSIS — J309 Allergic rhinitis, unspecified: Secondary | ICD-10-CM | POA: Diagnosis not present

## 2016-04-28 ENCOUNTER — Ambulatory Visit (INDEPENDENT_AMBULATORY_CARE_PROVIDER_SITE_OTHER): Payer: Medicare Other | Admitting: *Deleted

## 2016-04-28 DIAGNOSIS — J309 Allergic rhinitis, unspecified: Secondary | ICD-10-CM

## 2016-05-05 ENCOUNTER — Ambulatory Visit (INDEPENDENT_AMBULATORY_CARE_PROVIDER_SITE_OTHER): Payer: Medicare Other | Admitting: *Deleted

## 2016-05-05 DIAGNOSIS — J309 Allergic rhinitis, unspecified: Secondary | ICD-10-CM

## 2016-05-12 ENCOUNTER — Other Ambulatory Visit (INDEPENDENT_AMBULATORY_CARE_PROVIDER_SITE_OTHER): Payer: Medicare Other | Admitting: *Deleted

## 2016-05-12 ENCOUNTER — Ambulatory Visit (INDEPENDENT_AMBULATORY_CARE_PROVIDER_SITE_OTHER): Payer: Medicare Other

## 2016-05-12 DIAGNOSIS — R17 Unspecified jaundice: Secondary | ICD-10-CM | POA: Diagnosis not present

## 2016-05-12 DIAGNOSIS — J309 Allergic rhinitis, unspecified: Secondary | ICD-10-CM

## 2016-05-12 LAB — HEPATIC FUNCTION PANEL
ALT: 18 U/L (ref 6–29)
AST: 24 U/L (ref 10–35)
Albumin: 3.9 g/dL (ref 3.6–5.1)
Alkaline Phosphatase: 65 U/L (ref 33–130)
BILIRUBIN DIRECT: 0.3 mg/dL — AB (ref <=0.2)
BILIRUBIN INDIRECT: 0.8 mg/dL (ref 0.2–1.2)
TOTAL PROTEIN: 6.1 g/dL (ref 6.1–8.1)
Total Bilirubin: 1.1 mg/dL (ref 0.2–1.2)

## 2016-05-14 ENCOUNTER — Encounter: Payer: Self-pay | Admitting: Cardiovascular Disease

## 2016-05-14 NOTE — Telephone Encounter (Signed)
This encounter was created in error - please disregard.

## 2016-05-14 NOTE — Telephone Encounter (Signed)
Fu  Pt returning RN phone call- lab results. Please call back and discuss.   

## 2016-05-19 ENCOUNTER — Telehealth: Payer: Self-pay | Admitting: Internal Medicine

## 2016-05-19 ENCOUNTER — Ambulatory Visit (INDEPENDENT_AMBULATORY_CARE_PROVIDER_SITE_OTHER): Payer: Medicare Other

## 2016-05-19 DIAGNOSIS — J309 Allergic rhinitis, unspecified: Secondary | ICD-10-CM

## 2016-05-19 DIAGNOSIS — M7989 Other specified soft tissue disorders: Secondary | ICD-10-CM

## 2016-05-19 DIAGNOSIS — M79676 Pain in unspecified toe(s): Secondary | ICD-10-CM

## 2016-05-19 NOTE — Telephone Encounter (Signed)
Spoke with pt to inform.  

## 2016-05-19 NOTE — Telephone Encounter (Signed)
Patient walked in today to let us know that she has not found the right size for the compression hose. She states that the swelling in the leg and foot is still bad. She is asking for a referral to vein and vascular.   Patient states that she is getting a hammer toe on the same left foot and it is very painful she is asking if she needs to go to podiatrist or ortho provider ?

## 2016-05-19 NOTE — Telephone Encounter (Signed)
Referrals ordered for vascular and podiatry

## 2016-05-26 ENCOUNTER — Ambulatory Visit (INDEPENDENT_AMBULATORY_CARE_PROVIDER_SITE_OTHER): Payer: Medicare Other

## 2016-05-26 DIAGNOSIS — J309 Allergic rhinitis, unspecified: Secondary | ICD-10-CM | POA: Diagnosis not present

## 2016-05-31 ENCOUNTER — Ambulatory Visit (INDEPENDENT_AMBULATORY_CARE_PROVIDER_SITE_OTHER): Payer: Medicare Other | Admitting: *Deleted

## 2016-05-31 DIAGNOSIS — J309 Allergic rhinitis, unspecified: Secondary | ICD-10-CM | POA: Diagnosis not present

## 2016-06-08 ENCOUNTER — Other Ambulatory Visit: Payer: Self-pay | Admitting: Vascular Surgery

## 2016-06-08 DIAGNOSIS — M7989 Other specified soft tissue disorders: Secondary | ICD-10-CM

## 2016-06-08 DIAGNOSIS — R609 Edema, unspecified: Secondary | ICD-10-CM

## 2016-06-09 ENCOUNTER — Ambulatory Visit (INDEPENDENT_AMBULATORY_CARE_PROVIDER_SITE_OTHER): Payer: Medicare Other | Admitting: *Deleted

## 2016-06-09 ENCOUNTER — Ambulatory Visit (INDEPENDENT_AMBULATORY_CARE_PROVIDER_SITE_OTHER): Payer: Medicare Other | Admitting: Internal Medicine

## 2016-06-09 ENCOUNTER — Encounter: Payer: Self-pay | Admitting: Internal Medicine

## 2016-06-09 VITALS — BP 130/88 | HR 60 | Temp 97.5°F | Resp 16 | Ht 66.0 in | Wt 127.0 lb

## 2016-06-09 DIAGNOSIS — R600 Localized edema: Secondary | ICD-10-CM

## 2016-06-09 DIAGNOSIS — F329 Major depressive disorder, single episode, unspecified: Secondary | ICD-10-CM

## 2016-06-09 DIAGNOSIS — E785 Hyperlipidemia, unspecified: Secondary | ICD-10-CM

## 2016-06-09 DIAGNOSIS — E039 Hypothyroidism, unspecified: Secondary | ICD-10-CM | POA: Diagnosis not present

## 2016-06-09 DIAGNOSIS — F419 Anxiety disorder, unspecified: Secondary | ICD-10-CM

## 2016-06-09 DIAGNOSIS — Z0000 Encounter for general adult medical examination without abnormal findings: Secondary | ICD-10-CM

## 2016-06-09 DIAGNOSIS — I2510 Atherosclerotic heart disease of native coronary artery without angina pectoris: Secondary | ICD-10-CM

## 2016-06-09 DIAGNOSIS — G4700 Insomnia, unspecified: Secondary | ICD-10-CM | POA: Diagnosis not present

## 2016-06-09 DIAGNOSIS — K219 Gastro-esophageal reflux disease without esophagitis: Secondary | ICD-10-CM

## 2016-06-09 DIAGNOSIS — F32A Depression, unspecified: Secondary | ICD-10-CM

## 2016-06-09 DIAGNOSIS — J309 Allergic rhinitis, unspecified: Secondary | ICD-10-CM

## 2016-06-09 DIAGNOSIS — F418 Other specified anxiety disorders: Secondary | ICD-10-CM

## 2016-06-09 NOTE — Assessment & Plan Note (Signed)
No longer feels depressed or anxious Will stop prozac and see how she feels Can restart if needed

## 2016-06-09 NOTE — Assessment & Plan Note (Signed)
Wearing compression socks daily Taking lasix daily - not sure if it works - try taking only as needed

## 2016-06-09 NOTE — Progress Notes (Signed)
Pre visit review using our clinic review tool, if applicable. No additional management support is needed unless otherwise documented below in the visit note. 

## 2016-06-09 NOTE — Assessment & Plan Note (Signed)
Denies GERD symptoms. 

## 2016-06-09 NOTE — Progress Notes (Signed)
Subjective:    Patient ID: [name], female    DOB: 20-Jan-1941, 75 y.o.   MRN: [id number]  HPI Here for medicare wellness exam.   Ever since wearing the compression socks she has had transient sharp on the lateral aspect of left upper leg.  She denies pain in her back and hip.    She does not feel the lasix works.  She is taking it daily.  She wears compression socks daily.    She feels good and is not sure if she needs the prozac.  She is taking it every other day and feels ok.  She wants to try to stop it.   I have personally reviewed and have noted 1.The patient's medical and social history 2.Their use of alcohol, tobacco or illicit drugs 3.Their current medications and supplements 4.The patient's functional ability including ADL's, fall risks, home safety risks and                 hearing or visual impairment. 5.Diet and physical activities 6.Evidence for depression or mood disorders 7.Care team reviewed and updated - cardiology - Burt Knack,  Derm - Dr. Ronnald Ramp, Allergist - Dr Annamaria Boots   Are there smokers in your home (other than you)? No  Risk Factors Exercise: walks a mile daily, sometimes more Dietary issues discussed: well balanced, avoids sweets  Cardiac risk factors: advanced age, hypertension, hyperlipidemia  Depression Screen  Have you felt down, depressed or hopeless? No  Have you felt little interest or pleasure in doing things?  No  Activities of Daily Living In your present state of health, do you have any difficulty performing the following activities?:  Driving? No Managing money?  No Feeding yourself? No Getting from bed to chair? No Climbing a flight of stairs? No Preparing food and eating?: No Bathing or showering? No Getting dressed: No Getting to/using the toilet? No Moving around from place to place: No In the past year have you fallen or had a near fall?: Yes, tripped   Are you  sexually active?  No  Do you have more than one partner?  N/A  Hearing Difficulties: yes, wears aids Do you often ask people to speak up or repeat themselves? yes Do you experience ringing or noises in your ears? yes Do you have difficulty understanding soft or whispered voices? yes Vision:              Any change in vision: no             Up to date with eye exam:  Up to date  Memory:  Do you feel that you have a problem with memory? Yes, only with recall  Do you often misplace items? No  Do you feel safe at home?  Yes  Cognitive Testing  Alert, Orientated? Yes  Normal Appearance? Yes  Recall of three objects?  Yes  Can perform simple calculations? Yes  Displays appropriate judgment? Yes  Can read the correct time from a watch face? Yes   Advanced Directives have been discussed with the patient? Yes   Medications and allergies reviewed with patient and updated if appropriate.  Patient Active Problem List   Diagnosis Date Noted  . Bilateral leg edema 03/05/2016  . Xiphoid pain 03/05/2016  . Insomnia 01/16/2016  . Anxiety and depression 12/24/2015  . Seasonal and perennial allergic rhinitis 11/24/2015  . Asthma, mild intermittent, well-controlled 11/24/2015  . Occlusion and stenosis of carotid artery without mention of cerebral infarction 06/29/2011  .  Hypothyroidism 11/11/2010  . Hyperlipidemia 11/11/2010  . Senile osteoporosis 11/11/2010  . RHINOSINUSITIS, RECURRENT 10/29/2008  . Personal history of malignant neoplasm of breast 03/27/2008  . GERD 03/27/2008  . DIVERTICULOSIS 03/27/2008  . Coronary atherosclerosis 08/23/2007  . Aortic stenosis, moderate 08/23/2007  . DVT 08/23/2007    Current Outpatient Prescriptions on File Prior to Visit  Medication Sig Dispense Refill  . aspirin 81 MG tablet Take 81 mg by mouth daily.      Marland Kitchen atorvastatin (LIPITOR) 20 MG tablet Take 1 tablet by mouth  daily 90 tablet 3  . Calcium Carbonate (CALTRATE 600 PO) Take 2 tablets by  mouth daily.      . chlorpheniramine (CHLOR-TRIMETON) 4 MG tablet Take 4 mg by mouth daily as needed for allergies.    . Coenzyme Q10 (CO Q-10 PO) Take 50 mg by mouth daily.      Marland Kitchen FLUoxetine (PROZAC) 10 MG capsule Take 1 capsule (10 mg total) by mouth daily. 30 capsule 3  . fluticasone (FLONASE) 50 MCG/ACT nasal spray Place 1 spray into both nostrils daily.    . furosemide (LASIX) 20 MG tablet Take 1 tablet (20 mg total) by mouth daily. 90 tablet 3  . levothyroxine (SYNTHROID, LEVOTHROID) 75 MCG tablet Take 1 tablet (75 mcg total) by mouth daily before breakfast. 90 tablet 3  . meloxicam (MOBIC) 15 MG tablet Take 1 tablet (15 mg total) by mouth daily. Must establish with NEW PCP for additional refills. 90 tablet 1  . metoprolol tartrate (LOPRESSOR) 25 MG tablet Take 1 tablet by mouth two  times daily 180 tablet 3  . multivitamin (THERAGRAN) per tablet Take 1 tablet by mouth daily.      Marland Kitchen senna-docusate (SENOKOT-S) 8.6-50 MG per tablet Take 1 tablet by mouth daily.      Marland Kitchen zolpidem (AMBIEN) 10 MG tablet Take 1 tablet (10 mg total) by mouth at bedtime as needed for sleep. 90 tablet 0   No current facility-administered medications on file prior to visit.     Past Medical History:  Diagnosis Date  . ALLERGIC RHINITIS   . Allergy    SEASONAL  . Aortic stenosis, moderate   . Blood transfusion without reported diagnosis   . BREAST CANCER 1991   left, 1991; B mastectomy  . Cataract    BILATERAL-REMOVED  . Coronary artery disease    s/p CABGx5 2007  . CORONARY ARTERY DISEASE   . Depression   . DIVERTICULOSIS   . DVT   . EROSIVE GASTRITIS   . GERD   . Heart murmur   . History of DVT (deep vein thrombosis)   . HYPERLIPIDEMIA   . Hypertension   . HYPOTHYROIDISM   . Occlusion and stenosis of carotid artery without mention of cerebral infarction   . Osteopenia   . OSTEOPOROSIS   . RHINOSINUSITIS, RECURRENT     Past Surgical History:  Procedure Laterality Date  . APPENDECTOMY    .  COLONOSCOPY    . CORONARY ARTERY BYPASS GRAFT     5        2007  . MASTECTOMY     left with reconstruction and lymph node excision  1992  . MASTECTOMY     right with reconstruction  . REVISION RECONSTRUCTED BREAST Left 02/2014   [facility]ard (plastics in [place])  . right ankle arthroscopy    . TONSILLECTOMY AND ADENOIDECTOMY    . UPPER GASTROINTESTINAL ENDOSCOPY    . VAGINAL HYSTERECTOMY     ovaries not  excised    Social History   Social History  . Marital status: Divorced    Spouse name: N/A  . Number of children: N/A  . Years of education: N/A   Social History Main Topics  . Smoking status: Never Smoker  . Smokeless tobacco: Never Used  . Alcohol use No  . Drug use: No  . Sexual activity: Not on file   Other Topics Concern  . 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 Colon cancer Sister   . Colon cancer Sister   . Breast cancer Mother   . Lung disease Mother   . Emphysema Father     Review of Systems  Constitutional: Negative for chills and fever.  HENT: Positive for hearing loss and tinnitus.   Eyes: Negative for visual disturbance.  Respiratory: Negative for choking, shortness of breath and wheezing.   Cardiovascular: Positive for leg swelling. Negative for chest pain and palpitations.  Gastrointestinal: Negative for abdominal pain, blood in stool, constipation and diarrhea.       No gerd  Genitourinary: Negative for dysuria and hematuria.  Neurological: Negative for dizziness, light-headedness and headaches.  Psychiatric/Behavioral: Negative for dysphoric mood. The patient is not nervous/anxious.        Objective:   Vitals:   06/09/16 1554  BP: 130/88  Pulse: 60  Resp: 16  Temp: 97.5 F (36.4 C)   Filed Weights   06/09/16 1554  Weight: 127 lb (57.6 kg)   Body mass index is 20.5 kg/m.   Physical  Exam Constitutional: She appears well-developed and well-nourished. No distress.  HENT:  Head: Normocephalic and atraumatic.  Right Ear: External ear normal.  Left Ear: External ear normal.   Mouth/Throat: Oropharynx is clear and moist.  Eyes: Conjunctivae normal.  Neck: Neck supple. No tracheal deviation present. No thyromegaly present.  No carotid bruit  Cardiovascular: Normal rate, regular rhythm and normal heart sounds.   2/6 systolic murmur heard.  No edema. Pulmonary/Chest: Effort normal and breath sounds normal. No respiratory distress. She has no wheezes. She has no rales.  Breast: deferred to Gyn Abdominal: Soft. She exhibits no distension. There is no tenderness.  Lymphadenopathy: She has no cervical adenopathy.  Skin: Skin is warm and dry. She is not diaphoretic.  Psychiatric: She has a normal mood and affect. Her behavior is normal.       Assessment & Plan:   Wellness Exam: Immunizations  Up to date  Colonoscopy - due and has discussed with GI - will likely have one in the next 6 months Mammogram - not eligible Dexa - Up to date  Gyn - sees annually Eye exam Up to date  Hearing loss - wears hearing aids Memory concerns/difficulties - none, some recall issues that seem normal for age 79 of ADLs - fully Stressed the importance of regular exercise   Patient received copy of preventative screening tests/immunizations recommended for the next 5-10 years.

## 2016-06-09 NOTE — Assessment & Plan Note (Signed)
tsh has been in the normal range Continue current dose of medication

## 2016-06-09 NOTE — Assessment & Plan Note (Signed)
Continue low dose ambien Tolerates it well without side effects

## 2016-06-09 NOTE — Patient Instructions (Addendum)
  [name] , Thank you for taking time to come for your Medicare Wellness Visit. I appreciate your ongoing commitment to your health goals. Please review the following plan we discussed and let me know if I can assist you in the future.   These are the goals we discussed: Goals    None      This is a list of the screening recommended for you and due dates:  Health Maintenance  Topic Date Due  . Colon Cancer Screening  03/14/2016  . Flu Shot  06/15/2016  . Tetanus Vaccine  05/30/2024  . DEXA scan (bone density measurement)  Completed  . Shingles Vaccine  Completed  . Pneumonia vaccines  Completed     All other Health Maintenance issues reviewed.   All recommended immunizations and age-appropriate screenings are up-to-date or discussed.  No immunizations administered today.   Medications reviewed and updated.  Changes include stopping the prozac.  You can also try stopping the lasix and you can take it as needed for leg swelling.    Please followup in 6 months

## 2016-06-09 NOTE — Assessment & Plan Note (Signed)
No chest pain Continue current medications Following with Dr Burt Knack

## 2016-06-09 NOTE — Assessment & Plan Note (Signed)
Taking lipitor 20 mg daily Lipid panel check recently Continue same

## 2016-06-16 ENCOUNTER — Ambulatory Visit (INDEPENDENT_AMBULATORY_CARE_PROVIDER_SITE_OTHER): Payer: Medicare Other

## 2016-06-16 DIAGNOSIS — J309 Allergic rhinitis, unspecified: Secondary | ICD-10-CM

## 2016-06-17 ENCOUNTER — Encounter: Payer: Medicare Other | Admitting: Vascular Surgery

## 2016-06-17 ENCOUNTER — Encounter (HOSPITAL_COMMUNITY): Payer: Medicare Other

## 2016-06-23 ENCOUNTER — Ambulatory Visit: Payer: Medicare Other | Admitting: Internal Medicine

## 2016-06-23 ENCOUNTER — Ambulatory Visit (INDEPENDENT_AMBULATORY_CARE_PROVIDER_SITE_OTHER): Payer: Medicare Other

## 2016-06-23 DIAGNOSIS — J309 Allergic rhinitis, unspecified: Secondary | ICD-10-CM | POA: Diagnosis not present

## 2016-06-30 ENCOUNTER — Ambulatory Visit (INDEPENDENT_AMBULATORY_CARE_PROVIDER_SITE_OTHER): Payer: Medicare Other | Admitting: *Deleted

## 2016-06-30 DIAGNOSIS — J309 Allergic rhinitis, unspecified: Secondary | ICD-10-CM | POA: Diagnosis not present

## 2016-07-07 ENCOUNTER — Ambulatory Visit (INDEPENDENT_AMBULATORY_CARE_PROVIDER_SITE_OTHER): Payer: Medicare Other

## 2016-07-07 DIAGNOSIS — J309 Allergic rhinitis, unspecified: Secondary | ICD-10-CM | POA: Diagnosis not present

## 2016-07-14 ENCOUNTER — Ambulatory Visit (INDEPENDENT_AMBULATORY_CARE_PROVIDER_SITE_OTHER): Payer: Medicare Other | Admitting: *Deleted

## 2016-07-14 DIAGNOSIS — J309 Allergic rhinitis, unspecified: Secondary | ICD-10-CM | POA: Diagnosis not present

## 2016-07-21 ENCOUNTER — Ambulatory Visit (INDEPENDENT_AMBULATORY_CARE_PROVIDER_SITE_OTHER): Payer: Medicare Other

## 2016-07-21 DIAGNOSIS — J309 Allergic rhinitis, unspecified: Secondary | ICD-10-CM

## 2016-07-21 DIAGNOSIS — Z23 Encounter for immunization: Secondary | ICD-10-CM

## 2016-07-28 ENCOUNTER — Ambulatory Visit (INDEPENDENT_AMBULATORY_CARE_PROVIDER_SITE_OTHER): Payer: Medicare Other | Admitting: *Deleted

## 2016-07-28 DIAGNOSIS — J309 Allergic rhinitis, unspecified: Secondary | ICD-10-CM | POA: Diagnosis not present

## 2016-08-04 ENCOUNTER — Ambulatory Visit (INDEPENDENT_AMBULATORY_CARE_PROVIDER_SITE_OTHER): Payer: Medicare Other | Admitting: *Deleted

## 2016-08-04 DIAGNOSIS — J309 Allergic rhinitis, unspecified: Secondary | ICD-10-CM

## 2016-08-11 ENCOUNTER — Ambulatory Visit (INDEPENDENT_AMBULATORY_CARE_PROVIDER_SITE_OTHER): Payer: Medicare Other | Admitting: *Deleted

## 2016-08-11 DIAGNOSIS — J309 Allergic rhinitis, unspecified: Secondary | ICD-10-CM | POA: Diagnosis not present

## 2016-08-13 ENCOUNTER — Telehealth: Payer: Self-pay | Admitting: Internal Medicine

## 2016-08-13 DIAGNOSIS — J309 Allergic rhinitis, unspecified: Secondary | ICD-10-CM | POA: Diagnosis not present

## 2016-08-13 NOTE — Telephone Encounter (Signed)
Allergy Serum Extract Date Mixed: 08/13/16 Vial: 2 Strength: 1:10 Here/Mail/Pick Up: here Mixed By: tbs Last OV: 11/24/15 Pending OV: 11/24/16

## 2016-08-17 ENCOUNTER — Encounter: Payer: Self-pay | Admitting: Vascular Surgery

## 2016-08-18 ENCOUNTER — Ambulatory Visit (INDEPENDENT_AMBULATORY_CARE_PROVIDER_SITE_OTHER): Payer: Medicare Other | Admitting: *Deleted

## 2016-08-18 DIAGNOSIS — J309 Allergic rhinitis, unspecified: Secondary | ICD-10-CM | POA: Diagnosis not present

## 2016-08-20 ENCOUNTER — Ambulatory Visit (HOSPITAL_COMMUNITY)
Admission: RE | Admit: 2016-08-20 | Discharge: 2016-08-20 | Disposition: A | Discharge status: Home / Self Care | Payer: Medicare Other | Source: Ambulatory Visit | Attending: Vascular Surgery | Admitting: Vascular Surgery

## 2016-08-20 ENCOUNTER — Ambulatory Visit (INDEPENDENT_AMBULATORY_CARE_PROVIDER_SITE_OTHER): Payer: Medicare Other | Admitting: Vascular Surgery

## 2016-08-20 ENCOUNTER — Encounter: Payer: Self-pay | Admitting: Vascular Surgery

## 2016-08-20 DIAGNOSIS — I2510 Atherosclerotic heart disease of native coronary artery without angina pectoris: Secondary | ICD-10-CM | POA: Diagnosis not present

## 2016-08-20 DIAGNOSIS — I872 Venous insufficiency (chronic) (peripheral): Secondary | ICD-10-CM

## 2016-08-20 DIAGNOSIS — M7989 Other specified soft tissue disorders: Secondary | ICD-10-CM | POA: Diagnosis not present

## 2016-08-20 DIAGNOSIS — I8392 Asymptomatic varicose veins of left lower extremity: Secondary | ICD-10-CM | POA: Insufficient documentation

## 2016-08-20 DIAGNOSIS — R609 Edema, unspecified: Secondary | ICD-10-CM | POA: Diagnosis not present

## 2016-08-20 NOTE — Progress Notes (Signed)
Referred by:  [name], MD [place], [place] [address]  Reason for referral: Left leg swelling   History of Present Illness  [name] is a 75 y.o. (Nov 03, 1941) female who presents with chief complaint: left leg swelling.  Patient notes, onset of swelling year ago, associated with no obvious trigger.  The patient's symptoms include: left leg swelling.  The patient has had no history of DVT, known history of pregnancy, known history of varicose vein, no history of venous stasis ulcers, no history of  Lymphedema and no history of skin changes in lower legs.  There is no family history of venous disorders.  The patient has been used thigh high compression stockings in the past.   Past Medical History:  Diagnosis Date  . ALLERGIC RHINITIS   . Allergy    SEASONAL  . Aortic stenosis, moderate   . Blood transfusion without reported diagnosis   . BREAST CANCER 1991   left, 1991; B mastectomy  . Cataract    BILATERAL-REMOVED  . Coronary artery disease    s/p CABGx5 2007  . CORONARY ARTERY DISEASE   . Depression   . DIVERTICULOSIS   . DVT   . EROSIVE GASTRITIS   . GERD   . Heart murmur   . History of DVT (deep vein thrombosis)   . HYPERLIPIDEMIA   . Hypertension   . HYPOTHYROIDISM   . Occlusion and stenosis of carotid artery without mention of cerebral infarction   . Osteopenia   . OSTEOPOROSIS   . RHINOSINUSITIS, RECURRENT     Past Surgical History:  Procedure Laterality Date  . APPENDECTOMY    . COLONOSCOPY    . CORONARY ARTERY BYPASS GRAFT     5        2007  . MASTECTOMY     left with reconstruction and lymph node excision  1992  . MASTECTOMY     right with reconstruction  . REVISION RECONSTRUCTED BREAST Left 02/2014   willard (plastics in [place])  . right ankle arthroscopy    . TONSILLECTOMY AND ADENOIDECTOMY    . UPPER GASTROINTESTINAL ENDOSCOPY    . VAGINAL HYSTERECTOMY     ovaries not excised    Social History   Social History  . Marital  status: Divorced    Spouse name: N/A  . Number of children: N/A  . Years of education: N/A   Occupational History  . Not on file.   Social History Main Topics  . Smoking status: Never Smoker  . Smokeless tobacco: Never Used  . Alcohol use No  . Drug use: No  . Sexual activity: Not on file   Other Topics Concern  . 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 Colon cancer Sister   . Colon cancer Sister   . Breast cancer Mother   . Lung disease Mother   . Emphysema Father     Current Outpatient Prescriptions  Medication Sig Dispense Refill  . aspirin 81 MG tablet Take 81 mg by mouth daily.      Marland Kitchen atorvastatin (LIPITOR) 20 MG tablet Take 1 tablet by mouth  daily 90 tablet 3  . Calcium Carbonate (CALTRATE 600 PO) Take 2 tablets by mouth daily.      . chlorpheniramine (CHLOR-TRIMETON) 4 MG  tablet Take 4 mg by mouth daily as needed for allergies.    . Coenzyme Q10 (CO Q-10 PO) Take 50 mg by mouth daily.      . fluticasone (FLONASE) 50 MCG/ACT nasal spray Place 1 spray into both nostrils daily.    Marland Kitchen levothyroxine (SYNTHROID, LEVOTHROID) 75 MCG tablet Take 1 tablet (75 mcg total) by mouth daily before breakfast. 90 tablet 3  . meloxicam (MOBIC) 15 MG tablet Take 1 tablet (15 mg total) by mouth daily. Must establish with NEW PCP for additional refills. 90 tablet 1  . metoprolol tartrate (LOPRESSOR) 25 MG tablet Take 1 tablet by mouth two  times daily 180 tablet 3  . multivitamin (THERAGRAN) per tablet Take 1 tablet by mouth daily.      Marland Kitchen senna-docusate (SENOKOT-S) 8.6-50 MG per tablet Take 1 tablet by mouth daily.      Marland Kitchen zolpidem (AMBIEN) 10 MG tablet Take 1 tablet (10 mg total) by mouth at bedtime as needed for sleep. 90 tablet 0   No current facility-administered medications for this visit.     Allergies  Allergen Reactions  .  Clarithromycin Other (See Comments)    PATIENT UNSURE OF REACTION  . Codeine     REACTION: nausea  . Levofloxacin     REACTION: nausea  . Morphine     Nausea   . Naproxen     REACTION: nausea  . Penicillins     REACTION: RASH  . Pneumococcal Vaccines Rash    Small rash on arm at injection site.      REVIEW OF SYSTEMS:   Cardiac:  positive for: no symptoms, negative for: Chest pain or chest pressure, Shortness of breath upon exertion and Shortness of breath when lying flat,   Vascular:  positive for: Leg swelling,  negative for: Pain in calf, thigh, or hip brought on by ambulation, Pain in feet at night that wakes you up from your sleep and Blood clot in your veins  Pulmonary:  positive for: no symptoms,  negative for: Oxygen at home, Productive cough and Wheezing  Neurologic:  positive for: Problems with dizziness, negative for: Sudden weakness in arms or legs, Sudden numbness in arms or legs, Sudden onset of difficulty speaking or slurred speech and Temporary loss of vision in one eye  Gastrointestinal:  positive for: no symptoms, negative for: Blood in stool and Vomited blood  Genitourinary:  positive for: no symptoms, negative for: Burning when urinating and Blood in urine  Psychiatric:  positive for: no symptoms,  negative for: Major depression  Hematologic:  positive for: no symptoms,  negative for: negative for: Bleeding problems and Problems with blood clotting too easily  Dermatologic:  positive for: no symptoms, negative for: Rashes or ulcers  Constitutional:  positive for: no symptoms, negative for: Fever or chills  Ear/Nose/Throat:  positive for: no symptoms, negative for: Change in hearing, Nose bleeds and Sore throat  Musculoskeletal:  positive for: no symptoms, negative for: Back pain, Joint pain and Muscle pain   Physical Examination  Vitals:   08/20/16 1452  BP: 136/73  Pulse: 62  Resp: 16  Temp: 97 F (36.1 C)  TempSrc:  Oral  SpO2: 99%  Weight: 135 lb (61.2 kg)  Height: 5\' 6"  (1.676 m)    Body mass index is 21.79 kg/m.  General: Alert, O x 3, WD,NAD  Head: [place]/AT,   Ear/Nose/Throat: Hearing grossly intact, nares without erythema or drainage, oropharynx without Erythema or Exudate , Mallampati score: 3, Dentition intact  Eyes: PERRLA, EOMI,   Neck: Supple, mid-line trachea,    Pulmonary: Sym exp, good B air movt,CTA B  Cardiac: RRR, Nl S1, S2, faint murmur, No rubs, No S3,S4  Vascular: Vessel Right Left  Radial Palpable Palpable  Brachial Palpable Palpable  Carotid Palpable, No Bruit Palpable, No Bruit  Aorta Not palpable N/A  Femoral Palpable Palpable  Popliteal Not palpable Not palpable  PT Palpable Palpable  DP Palpable Palpable   Gastrointestinal: soft, non-distended, non-tender to palpation, No guarding or rebound, no HSM, no masses, no CVAT B, No palpable prominent aortic pulse,    Musculoskeletal: M/S 5/5 throughout  , Extremities without ischemic changes  , Edema in L legs, B spider veins and varicosities present  , No LDS present  Neurologic: CN 2-12 intact , Pain and light touch intact in extremities , Motor exam as listed above  Psychiatric: Judgement intact, Mood & affect appropriate for pt's clinical situation  Dermatologic: See M/S exam for extremity exam, No rashes otherwise noted  Lymph : Palpable lymph nodes: None   Non-Invasive Vascular Imaging  LLE Venous Insufficiency Duplex (Date: 08/20/2016):   LLE:   no DVT and SVT,   + GSV reflux: distal only reflux  no SSV reflux,  + deep venous reflux: CFV, PV   Outside Studies/Documentation 4 pages of outside documents were reviewed including: outpatient PCP chart.   Medical Decision Making  BRIDGETE ARRAZOLA is a 75 y.o. female who presents with: BLE chronic venous insufficiency (C3),    Based on the patient's history and examination, I recommend: compressive therapy.  Given her limited GSV reflux, I doubt  EVLA would provide any significant benefit.  I discussed with the patient the use of her 20-30 mm thigh high compression stockings.  Thank you for allowing [place] to participate in this patient's care.   Adele Barthel, MD [facility] Office: [phone] Pager: [phone]  08/20/2016, 3:29 PM

## 2016-08-23 DIAGNOSIS — Z85828 Personal history of other malignant neoplasm of skin: Secondary | ICD-10-CM | POA: Diagnosis not present

## 2016-08-23 DIAGNOSIS — L659 Nonscarring hair loss, unspecified: Secondary | ICD-10-CM | POA: Diagnosis not present

## 2016-08-23 DIAGNOSIS — L821 Other seborrheic keratosis: Secondary | ICD-10-CM | POA: Diagnosis not present

## 2016-08-23 DIAGNOSIS — L720 Epidermal cyst: Secondary | ICD-10-CM | POA: Diagnosis not present

## 2016-08-23 DIAGNOSIS — D1801 Hemangioma of skin and subcutaneous tissue: Secondary | ICD-10-CM | POA: Diagnosis not present

## 2016-08-25 ENCOUNTER — Ambulatory Visit (INDEPENDENT_AMBULATORY_CARE_PROVIDER_SITE_OTHER): Payer: Medicare Other | Admitting: *Deleted

## 2016-08-25 DIAGNOSIS — J309 Allergic rhinitis, unspecified: Secondary | ICD-10-CM | POA: Diagnosis not present

## 2016-09-01 ENCOUNTER — Encounter: Payer: Self-pay | Admitting: Internal Medicine

## 2016-09-01 ENCOUNTER — Ambulatory Visit (INDEPENDENT_AMBULATORY_CARE_PROVIDER_SITE_OTHER): Payer: Medicare Other | Admitting: Internal Medicine

## 2016-09-01 ENCOUNTER — Ambulatory Visit (INDEPENDENT_AMBULATORY_CARE_PROVIDER_SITE_OTHER): Payer: Medicare Other | Admitting: *Deleted

## 2016-09-01 VITALS — BP 142/64 | HR 66 | Temp 98.2°F | Resp 16 | Wt 135.0 lb

## 2016-09-01 DIAGNOSIS — J309 Allergic rhinitis, unspecified: Secondary | ICD-10-CM | POA: Diagnosis not present

## 2016-09-01 DIAGNOSIS — R079 Chest pain, unspecified: Secondary | ICD-10-CM

## 2016-09-01 DIAGNOSIS — I2510 Atherosclerotic heart disease of native coronary artery without angina pectoris: Secondary | ICD-10-CM | POA: Diagnosis not present

## 2016-09-01 NOTE — Progress Notes (Signed)
Pre visit review using our clinic review tool, if applicable. No additional management support is needed unless otherwise documented below in the visit note. 

## 2016-09-01 NOTE — Progress Notes (Signed)
Subjective:    Patient ID: [name], female    DOB: 1941-04-02, 75 y.o.   MRN: [id number]  HPI She is here for an acute visit.   For two months she has had an intermittent pain in her left ribs.  The pain is sharp in nature and transient.  She feels it is getting worse/happening more often since it started.  She denies any association with activity or breaths.  She denies any injury or accident that caused the pain.  She denies SOB, cough, wheeze, back pain or fevers.  She is concerned because of her history of breast cancer.  She had a mastectomy 25 years ago.     Medications and allergies reviewed with patient and updated if appropriate.  Patient Active Problem List   Diagnosis Date Noted  . Chronic venous insufficiency 08/20/2016  . Bilateral leg edema 03/05/2016  . Xiphoid pain 03/05/2016  . Insomnia 01/16/2016  . Anxiety and depression 12/24/2015  . Seasonal and perennial allergic rhinitis 11/24/2015  . Asthma, mild intermittent, well-controlled 11/24/2015  . Occlusion and stenosis of carotid artery without mention of cerebral infarction 06/29/2011  . Hypothyroidism 11/11/2010  . Hyperlipidemia 11/11/2010  . Senile osteoporosis 11/11/2010  . RHINOSINUSITIS, RECURRENT 10/29/2008  . Personal history of malignant neoplasm of breast 03/27/2008  . GERD 03/27/2008  . DIVERTICULOSIS 03/27/2008  . Coronary atherosclerosis 08/23/2007  . Aortic stenosis, moderate 08/23/2007  . DVT 08/23/2007    Current Outpatient Prescriptions on File Prior to Visit  Medication Sig Dispense Refill  . aspirin 81 MG tablet Take 81 mg by mouth daily.      Marland Kitchen atorvastatin (LIPITOR) 20 MG tablet Take 1 tablet by mouth  daily 90 tablet 3  . Calcium Carbonate (CALTRATE 600 PO) Take 2 tablets by mouth daily.      . chlorpheniramine (CHLOR-TRIMETON) 4 MG tablet Take 4 mg by mouth daily as needed for allergies.    . Coenzyme Q10 (CO Q-10 PO) Take 50 mg by mouth daily.      . fluticasone (FLONASE) 50  MCG/ACT nasal spray Place 1 spray into both nostrils daily.    Marland Kitchen levothyroxine (SYNTHROID, LEVOTHROID) 75 MCG tablet Take 1 tablet (75 mcg total) by mouth daily before breakfast. 90 tablet 3  . meloxicam (MOBIC) 15 MG tablet Take 1 tablet (15 mg total) by mouth daily. Must establish with NEW PCP for additional refills. 90 tablet 1  . metoprolol tartrate (LOPRESSOR) 25 MG tablet Take 1 tablet by mouth two  times daily 180 tablet 3  . multivitamin (THERAGRAN) per tablet Take 1 tablet by mouth daily.      Marland Kitchen senna-docusate (SENOKOT-S) 8.6-50 MG per tablet Take 1 tablet by mouth daily.      Marland Kitchen zolpidem (AMBIEN) 10 MG tablet Take 1 tablet (10 mg total) by mouth at bedtime as needed for sleep. 90 tablet 0   No current facility-administered medications on file prior to visit.     Past Medical History:  Diagnosis Date  . ALLERGIC RHINITIS   . Allergy    SEASONAL  . Aortic stenosis, moderate   . Blood transfusion without reported diagnosis   . BREAST CANCER 1991   left, 1991; B mastectomy  . Cataract    BILATERAL-REMOVED  . Coronary artery disease    s/p CABGx5 2007  . CORONARY ARTERY DISEASE   . Depression   . DIVERTICULOSIS   . DVT   . EROSIVE GASTRITIS   . GERD   .  Heart murmur   . History of DVT (deep vein thrombosis)   . HYPERLIPIDEMIA   . Hypertension   . HYPOTHYROIDISM   . Occlusion and stenosis of carotid artery without mention of cerebral infarction   . Osteopenia   . OSTEOPOROSIS   . RHINOSINUSITIS, RECURRENT     Past Surgical History:  Procedure Laterality Date  . APPENDECTOMY    . COLONOSCOPY    . CORONARY ARTERY BYPASS GRAFT     5        2007  . MASTECTOMY     left with reconstruction and lymph node excision  1992  . MASTECTOMY     right with reconstruction  . REVISION RECONSTRUCTED BREAST Left 02/2014   willard (plastics in [place])  . right ankle arthroscopy    . TONSILLECTOMY AND ADENOIDECTOMY    . UPPER GASTROINTESTINAL ENDOSCOPY    . VAGINAL HYSTERECTOMY      ovaries not excised    Social History   Social History  . Marital status: Divorced    Spouse name: N/A  . Number of children: N/A  . Years of education: N/A   Social History Main Topics  . Smoking status: Never Smoker  . Smokeless tobacco: Never Used  . Alcohol use No  . Drug use: No  . Sexual activity: Not on file   Other Topics Concern  . 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 Colon cancer Sister   . Colon cancer Sister   . Breast cancer Mother   . Lung disease Mother   . Emphysema Father     Review of Systems  Constitutional: Negative for chills and fever.  Respiratory: Negative for cough, shortness of breath and wheezing.   Cardiovascular: Negative for chest pain.  Musculoskeletal: Negative for back pain.  Skin: Negative for rash.  Neurological: Negative for numbness.       Objective:   Vitals:   09/01/16 1631  BP: (!) 142/64  Pulse: 66  Resp: 16  Temp: 98.2 F (36.8 C)   Filed Weights   09/01/16 1631  Weight: 135 lb (61.2 kg)   Body mass index is 21.79 kg/m.   Physical Exam  Constitutional: She appears well-developed and well-nourished. No distress.  HENT:  Head: Normocephalic and atraumatic.  Eyes: Conjunctivae are normal.  Cardiovascular: Normal rate, regular rhythm and normal heart sounds.   No murmur heard. Pulmonary/Chest: Effort normal and breath sounds normal. No respiratory distress. She has no wheezes. She has no rales.  No tenderness with palpation left rib cage  Skin: Skin is warm and dry. No rash noted. She is not diaphoretic. No erythema.          Assessment & Plan:   See Problem List for Assessment and Plan of chronic medical problems.

## 2016-09-01 NOTE — Patient Instructions (Signed)
Have the xrays done tomorrow.  We will call you with the results.

## 2016-09-01 NOTE — Assessment & Plan Note (Signed)
Left sided chest pain - left rib pain Intermittent for two months, transient, sharp pain Non-tender on exam, no pain with breathing, movements Unlikely metastatic from prior breast cancer, but that is her fear - will check CXR, Rib xrays Likely MSK in nature Can try stretching, stay hydrated No evidence of neuritis or shingles symptomatic treatment, monitor

## 2016-09-02 ENCOUNTER — Ambulatory Visit (INDEPENDENT_AMBULATORY_CARE_PROVIDER_SITE_OTHER)
Admission: RE | Admit: 2016-09-02 | Discharge: 2016-09-02 | Disposition: A | Discharge status: Home / Self Care | Payer: Medicare Other | Source: Ambulatory Visit | Attending: Internal Medicine | Admitting: Internal Medicine

## 2016-09-02 DIAGNOSIS — R0781 Pleurodynia: Secondary | ICD-10-CM | POA: Diagnosis not present

## 2016-09-02 DIAGNOSIS — R079 Chest pain, unspecified: Secondary | ICD-10-CM | POA: Diagnosis not present

## 2016-09-07 DIAGNOSIS — H04123 Dry eye syndrome of bilateral lacrimal glands: Secondary | ICD-10-CM | POA: Diagnosis not present

## 2016-09-07 DIAGNOSIS — H52203 Unspecified astigmatism, bilateral: Secondary | ICD-10-CM | POA: Diagnosis not present

## 2016-09-08 ENCOUNTER — Ambulatory Visit (INDEPENDENT_AMBULATORY_CARE_PROVIDER_SITE_OTHER): Payer: Medicare Other | Admitting: *Deleted

## 2016-09-08 DIAGNOSIS — J309 Allergic rhinitis, unspecified: Secondary | ICD-10-CM | POA: Diagnosis not present

## 2016-09-15 ENCOUNTER — Ambulatory Visit (INDEPENDENT_AMBULATORY_CARE_PROVIDER_SITE_OTHER): Payer: Medicare Other | Admitting: *Deleted

## 2016-09-15 DIAGNOSIS — J309 Allergic rhinitis, unspecified: Secondary | ICD-10-CM | POA: Diagnosis not present

## 2016-09-20 ENCOUNTER — Other Ambulatory Visit: Payer: Self-pay | Admitting: Internal Medicine

## 2016-09-20 NOTE — Telephone Encounter (Signed)
RX faxed to [facility] 

## 2016-09-22 ENCOUNTER — Telehealth: Payer: Self-pay | Admitting: Cardiovascular Disease

## 2016-09-22 ENCOUNTER — Ambulatory Visit (INDEPENDENT_AMBULATORY_CARE_PROVIDER_SITE_OTHER): Payer: Medicare Other | Admitting: *Deleted

## 2016-09-22 DIAGNOSIS — J309 Allergic rhinitis, unspecified: Secondary | ICD-10-CM | POA: Diagnosis not present

## 2016-09-22 NOTE — Telephone Encounter (Signed)
New message  Pt called to reschedule, new appt is on 3/16 @ 3:45  Pt wants to know if blood work needs to be done prior to march  Recall was for November, pt did not want to schedule w/PA  Please call back

## 2016-09-22 NOTE — Telephone Encounter (Signed)
Please advised if you would like labs on this patient prior to the 01/2017 appt.  Thanks

## 2016-09-28 NOTE — Telephone Encounter (Signed)
I left a detailed message on the pt's voicemail in regards to lab work with Dr Antionette Char comments.

## 2016-09-28 NOTE — Telephone Encounter (Signed)
Labs are up to date through June 2018. At that time should have CMET/Lipid panel

## 2016-09-29 ENCOUNTER — Ambulatory Visit (INDEPENDENT_AMBULATORY_CARE_PROVIDER_SITE_OTHER): Payer: Medicare Other | Admitting: *Deleted

## 2016-09-29 DIAGNOSIS — J309 Allergic rhinitis, unspecified: Secondary | ICD-10-CM

## 2016-10-06 ENCOUNTER — Ambulatory Visit: Payer: Medicare Other

## 2016-10-07 ENCOUNTER — Other Ambulatory Visit: Payer: Self-pay | Admitting: Internal Medicine

## 2016-10-13 ENCOUNTER — Ambulatory Visit (INDEPENDENT_AMBULATORY_CARE_PROVIDER_SITE_OTHER): Payer: Medicare Other | Admitting: *Deleted

## 2016-10-13 DIAGNOSIS — J309 Allergic rhinitis, unspecified: Secondary | ICD-10-CM | POA: Diagnosis not present

## 2016-10-20 ENCOUNTER — Ambulatory Visit (INDEPENDENT_AMBULATORY_CARE_PROVIDER_SITE_OTHER): Payer: Medicare Other | Admitting: *Deleted

## 2016-10-20 DIAGNOSIS — J309 Allergic rhinitis, unspecified: Secondary | ICD-10-CM

## 2016-10-27 ENCOUNTER — Ambulatory Visit (INDEPENDENT_AMBULATORY_CARE_PROVIDER_SITE_OTHER): Payer: Medicare Other

## 2016-10-27 ENCOUNTER — Ambulatory Visit: Payer: Medicare Other

## 2016-10-27 DIAGNOSIS — J309 Allergic rhinitis, unspecified: Secondary | ICD-10-CM

## 2016-11-03 ENCOUNTER — Ambulatory Visit (INDEPENDENT_AMBULATORY_CARE_PROVIDER_SITE_OTHER): Payer: Medicare Other | Admitting: *Deleted

## 2016-11-03 DIAGNOSIS — J309 Allergic rhinitis, unspecified: Secondary | ICD-10-CM | POA: Diagnosis not present

## 2016-11-04 ENCOUNTER — Ambulatory Visit: Payer: Medicare Other

## 2016-11-10 ENCOUNTER — Ambulatory Visit: Payer: Medicare Other

## 2016-11-11 ENCOUNTER — Ambulatory Visit (INDEPENDENT_AMBULATORY_CARE_PROVIDER_SITE_OTHER): Payer: Medicare Other | Admitting: *Deleted

## 2016-11-11 DIAGNOSIS — J309 Allergic rhinitis, unspecified: Secondary | ICD-10-CM | POA: Diagnosis not present

## 2016-11-24 ENCOUNTER — Ambulatory Visit: Payer: Medicare Other | Admitting: Internal Medicine

## 2016-11-25 ENCOUNTER — Ambulatory Visit: Payer: Medicare Other | Admitting: Internal Medicine

## 2016-12-08 ENCOUNTER — Ambulatory Visit: Payer: Medicare Other | Admitting: Internal Medicine

## 2016-12-15 ENCOUNTER — Other Ambulatory Visit: Payer: Self-pay | Admitting: Internal Medicine

## 2017-01-05 DIAGNOSIS — H1859 Other hereditary corneal dystrophies: Secondary | ICD-10-CM | POA: Diagnosis not present

## 2017-01-05 DIAGNOSIS — Z961 Presence of intraocular lens: Secondary | ICD-10-CM | POA: Diagnosis not present

## 2017-01-28 ENCOUNTER — Encounter (INDEPENDENT_AMBULATORY_CARE_PROVIDER_SITE_OTHER): Payer: Self-pay

## 2017-01-28 ENCOUNTER — Ambulatory Visit (INDEPENDENT_AMBULATORY_CARE_PROVIDER_SITE_OTHER): Payer: Medicare Other | Admitting: Cardiovascular Disease

## 2017-01-28 ENCOUNTER — Encounter: Payer: Self-pay | Admitting: Cardiovascular Disease

## 2017-01-28 VITALS — BP 120/80 | HR 60 | Ht 65.0 in | Wt 140.0 lb

## 2017-01-28 DIAGNOSIS — I10 Essential (primary) hypertension: Secondary | ICD-10-CM | POA: Diagnosis not present

## 2017-01-28 DIAGNOSIS — I350 Nonrheumatic aortic (valve) stenosis: Secondary | ICD-10-CM | POA: Diagnosis not present

## 2017-01-28 DIAGNOSIS — E785 Hyperlipidemia, unspecified: Secondary | ICD-10-CM | POA: Diagnosis not present

## 2017-01-28 NOTE — Progress Notes (Signed)
Cardiology Office Note Date:  01/28/2017   ID:  Angelica, Mullen [birth year], MRN [id number]  PCP:  [name], MD  Cardiologist:  [name], MD    Chief Complaint  Patient presents with  . Aortic Stenosis    follow up   History of Present Illness: [name] is a 76 y.o. female who presents for  follow-up evaluation. The patient has coronary artery disease with history of CABG in 2007. She underwent cardiac catheterization in 2011 demonstrating patency of all of her bypass grafts and preserved LV function. Last Myoview scan in March 2016 showed no ischemia.   She's doing well. She saw Dr Bridgett Larsson for left leg swelling and was found to have venous insufficiency, now wearing compression stockings with some improvement.   She is here alone today. She had syncope after the last time she donated blood. States that she hadn't any problems prior to that. She didn't eat or drink much prior to donating blood and had a prodrome of lightheadedness. She's had no recurrent issues. Denies exertional chest pain or pressure, dyspnea, or lightheadedness. Tolerating her medicines well.  Past Medical History:  Diagnosis Date  . ALLERGIC RHINITIS   . Allergy    SEASONAL  . Aortic stenosis, moderate   . Blood transfusion without reported diagnosis   . BREAST CANCER 1991   left, 1991; B mastectomy  . Cataract    BILATERAL-REMOVED  . Coronary artery disease    s/p CABGx5 2007  . CORONARY ARTERY DISEASE   . Depression   . DIVERTICULOSIS   . DVT   . EROSIVE GASTRITIS   . GERD   . Heart murmur   . History of DVT (deep vein thrombosis)   . HYPERLIPIDEMIA   . Hypertension   . HYPOTHYROIDISM   . Occlusion and stenosis of carotid artery without mention of cerebral infarction   . Osteopenia   . OSTEOPOROSIS   . RHINOSINUSITIS, RECURRENT     Past Surgical History:  Procedure Laterality Date  . APPENDECTOMY    . COLONOSCOPY    . CORONARY ARTERY BYPASS GRAFT     5        2007  .  MASTECTOMY     left with reconstruction and lymph node excision  1992  . MASTECTOMY     right with reconstruction  . REVISION RECONSTRUCTED BREAST Left 02/2014   willard (plastics in [place])  . right ankle arthroscopy    . TONSILLECTOMY AND ADENOIDECTOMY    . UPPER GASTROINTESTINAL ENDOSCOPY    . VAGINAL HYSTERECTOMY     ovaries not excised    Current Outpatient Prescriptions  Medication Sig Dispense Refill  . aspirin 81 MG tablet Take 81 mg by mouth daily.      Marland Kitchen atorvastatin (LIPITOR) 20 MG tablet Take 1 tablet by mouth  daily 90 tablet 3  . Calcium Carbonate (CALTRATE 600 PO) Take 2 tablets by mouth daily.      . chlorpheniramine (CHLOR-TRIMETON) 4 MG tablet Take 4 mg by mouth daily as needed for allergies.    . Coenzyme Q10 (CO Q-10 PO) Take 50 mg by mouth daily.      . fluticasone (FLONASE) 50 MCG/ACT nasal spray INSTILL 1 TO 2 SPRAYS IN EACH NOSTRIL ONCE OR TWICE DAILY 16 g 0  . levothyroxine (SYNTHROID, LEVOTHROID) 75 MCG tablet TAKE 1 TABLET BY MOUTH  DAILY BEFORE BREAKFAST 90 tablet 1  . meloxicam (MOBIC) 15 MG tablet Take 1 tablet (15 mg  total) by mouth daily. Must establish with NEW PCP for additional refills. 90 tablet 1  . metoprolol tartrate (LOPRESSOR) 25 MG tablet Take 1 tablet by mouth two  times daily 180 tablet 3  . multivitamin (THERAGRAN) per tablet Take 1 tablet by mouth daily.      . pantoprazole (PROTONIX) 40 MG tablet Take 40 mg by mouth daily.    Marland Kitchen senna-docusate (SENOKOT-S) 8.6-50 MG per tablet Take 1 tablet by mouth daily.      Marland Kitchen zolpidem (AMBIEN) 10 MG tablet Take 10 mg by mouth at bedtime as needed for sleep.     No current facility-administered medications for this visit.    Allergies:   Clarithromycin; Codeine; Levofloxacin; Morphine; Naproxen; Penicillins; and Pneumococcal vaccines   Social History:  The patient  reports that she has never smoked. She has never used smokeless tobacco. She reports that she does not drink alcohol or use drugs.   Family  History:  The patient's  family history includes Breast cancer in her mother; Colon cancer in her sister and sister; Emphysema in her father; Lung disease in her mother.   ROS:  Please see the history of present illness.  All other systems are reviewed and negative.   PHYSICAL EXAM: VS:  BP 120/80   Pulse 60   Ht 5' 5"  (1.651 m)   Wt 140 lb (63.5 kg)   BMI 23.30 kg/m  , BMI Body mass index is 23.3 kg/m. GEN: Well nourished, well developed, in no acute distress  HEENT: normal  Neck: no JVD, no masses. No carotid bruits Cardiac: RRR with 3/6 harsh mid-peaking systolic murmur at the RUSB                Respiratory:  clear to auscultation bilaterally, normal work of breathing GI: soft, nontender, nondistended, + BS MS: no deformity or atrophy  Ext: no pretibial edema, pedal pulses 2+= bilaterally Skin: warm and dry, no rash Neuro:  Strength and sensation are intact Psych: euthymic mood, full affect  EKG:  EKG is ordered today. The ekg ordered today shows NSR 60 bpm, within normal limits  Recent Labs: 04/05/2016: BUN 17; Creat 0.70; Potassium 4.1; Sodium 139 05/12/2016: ALT 18   Lipid Panel     Component Value Date/Time   CHOL 131 04/05/2016 1059   TRIG 65 04/05/2016 1059   HDL 52 04/05/2016 1059   CHOLHDL 2.5 04/05/2016 1059   VLDL 13 04/05/2016 1059   LDLCALC 66 04/05/2016 1059    Wt Readings from Last 3 Encounters:  01/28/17 140 lb (63.5 kg)  09/01/16 135 lb (61.2 kg)  08/20/16 135 lb (61.2 kg)    Cardiac Studies Reviewed: 2D Echo 02-23-2016: Study Conclusions  - Left ventricle: The cavity size was normal. Wall thickness was  increased in a pattern of mild LVH. Systolic function was normal.  The estimated ejection fraction was in the range of 60% to 65%.  Wall motion was normal; there were no regional wall motion  abnormalities. Doppler parameters are consistent with a  reversible restrictive pattern, indicative of decreased left  ventricular diastolic  compliance and/or increased left atrial  pressure (grade 3 diastolic dysfunction). - Aortic valve: There was mild stenosis. - Mitral valve: Mildly calcified annulus. There was mild  regurgitation. - Pulmonic valve: There was moderate regurgitation. - Pulmonary arteries: Systolic pressure was mildly increased. PA  peak pressure: 37 mm Hg (S).  Carotid Duplex 02/07/2015: <40% carotid stenosis bilaterally 2 year follow-up recommend  Myoview Scan 02/10/2015:  Impression Exercise Capacity: Lexiscan with no exercise. BP Response: Normal blood pressure response. Clinical Symptoms: There is chest tightness and dyspnea ECG Impression: No significant ST segment change suggestive of ischemia. Comparison with Prior Nuclear Study: No images to compare  Overall Impression: Normal stress nuclear study.  LV Ejection Fraction: 81%. LV Wall Motion: NL LV Function; NL Wall Motion   ASSESSMENT AND PLAN: 1.  Coronary artery disease, native vessel, without angina: The patient is doing well in her current medical program continued. I will see her back in 6 months.  2. Mild aortic stenosis, asymptomatic: The patient's heart murmur is more audible and I suspect she is progressing to moderate aortic stenosis. Will check an echo before her next office visit in 6 months. We discussed car no symptoms of aortic stenosis today. She continues to be a little walk 1-1/2-2 miles at her church on a regular basis without symptoms. We discussed the natural history of aortic stenosis and potential treatment options.  3. Essential hypertension: Blood pressure is well controlled on current medicines.  4. Hyperlipidemia: The patient is treated with atorvastatin. Most recent lipids are reviewed and at goal. Will repeat lab work in June.  Current medicines are reviewed with the patient today.  The patient does not have concerns regarding medicines.  Labs/ tests ordered today include:   Orders Placed This  Encounter  Procedures  . Comp Met (CMET)  . Lipid panel  . EKG 12-Lead  . ECHOCARDIOGRAM COMPLETE    Disposition:   FU 6 months with an echo prior to the visit  Signed, [name], MD  01/28/2017 5:28 PM    [address], [place], [place]  [address] Phone: [phone]; Fax: [phone]

## 2017-01-28 NOTE — Patient Instructions (Signed)
Medication Instructions:  Your physician recommends that you continue on your current medications as directed. Please refer to the Current Medication list given to you today.  Labwork: Your physician recommends that you return for a FASTING CMP and LIPID in June--nothing to eat or drink after midnight, lab opens at 7:30 AM.   Testing/Procedures: Your physician has requested that you have an echocardiogram in 6 MONTHS. Echocardiography is a painless test that uses sound waves to create images of your heart. It provides your doctor with information about the size and shape of your heart and how well your heart's chambers and valves are working. This procedure takes approximately one hour. There are no restrictions for this procedure.  Follow-Up: Your physician wants you to follow-up in: 6 MONTHS with Dr Burt Knack.  You will receive a reminder letter in the mail two months in advance. If you don't receive a letter, please call our office to schedule the follow-up appointment.   Any Other Special Instructions Will Be Listed Below (If Applicable).     If you need a refill on your cardiac medications before your next appointment, please call your pharmacy.

## 2017-02-04 DIAGNOSIS — Z124 Encounter for screening for malignant neoplasm of cervix: Secondary | ICD-10-CM | POA: Diagnosis not present

## 2017-02-07 ENCOUNTER — Encounter: Payer: Self-pay | Admitting: Internal Medicine

## 2017-02-09 ENCOUNTER — Other Ambulatory Visit: Payer: Self-pay | Admitting: Obstetrics & Gynecology

## 2017-02-09 DIAGNOSIS — Z9013 Acquired absence of bilateral breasts and nipples: Secondary | ICD-10-CM

## 2017-02-09 DIAGNOSIS — R222 Localized swelling, mass and lump, trunk: Secondary | ICD-10-CM

## 2017-02-10 ENCOUNTER — Ambulatory Visit
Admission: RE | Admit: 2017-02-10 | Discharge: 2017-02-10 | Disposition: A | Discharge status: Home / Self Care | Payer: Medicare Other | Source: Ambulatory Visit | Attending: Obstetrics & Gynecology | Admitting: Obstetrics & Gynecology

## 2017-02-10 DIAGNOSIS — R222 Localized swelling, mass and lump, trunk: Secondary | ICD-10-CM

## 2017-02-10 DIAGNOSIS — Z9013 Acquired absence of bilateral breasts and nipples: Secondary | ICD-10-CM

## 2017-02-10 MED ORDER — IOPAMIDOL (ISOVUE-300) INJECTION 61%
75.0000 mL | Freq: Once | INTRAVENOUS | Status: AC | PRN
  Administered 2017-02-10: 75 mL via INTRAVENOUS

## 2017-02-16 ENCOUNTER — Encounter: Payer: Self-pay | Admitting: Internal Medicine

## 2017-02-16 ENCOUNTER — Ambulatory Visit (INDEPENDENT_AMBULATORY_CARE_PROVIDER_SITE_OTHER): Payer: Medicare Other | Admitting: Internal Medicine

## 2017-02-16 VITALS — BP 148/84 | HR 61 | Temp 97.3°F | Resp 16 | Wt 140.0 lb

## 2017-02-16 DIAGNOSIS — M1990 Unspecified osteoarthritis, unspecified site: Secondary | ICD-10-CM | POA: Insufficient documentation

## 2017-02-16 DIAGNOSIS — R222 Localized swelling, mass and lump, trunk: Secondary | ICD-10-CM | POA: Diagnosis not present

## 2017-02-16 DIAGNOSIS — R32 Unspecified urinary incontinence: Secondary | ICD-10-CM | POA: Diagnosis not present

## 2017-02-16 MED ORDER — MELOXICAM 15 MG PO TABS: 15.0000 mg | ORAL_TABLET | Freq: Every day | ORAL | 1 refills | Status: DC

## 2017-02-16 NOTE — Progress Notes (Signed)
Pre visit review using our clinic review tool, if applicable. No additional management support is needed unless otherwise documented below in the visit note. 

## 2017-02-16 NOTE — Assessment & Plan Note (Signed)
Referral to urology for further evaluation

## 2017-02-16 NOTE — Patient Instructions (Signed)
A referral was ordered for general surgery and urology.

## 2017-02-16 NOTE — Assessment & Plan Note (Signed)
She has arthritis in multiple joints Taking a quarter of mobic as needed -ok to continue - advised not to use regularly

## 2017-02-16 NOTE — Assessment & Plan Note (Signed)
Nodule left chest - palpable, seen on ct scan h/o breast CA s/p b/l mastectomy Refer to general surgery for further evaluation / biopsy/removal

## 2017-02-16 NOTE — Progress Notes (Signed)
Subjective:    Patient ID: [name], female    DOB: [birth year], 76 y.o.   MRN: [id number]  HPI She is here for an acute visit.   She had a tender lump in her left chest wall that she noticed one day when itching her chest.  She had an appointment with Dr Dellis Filbert the next day and she felt it as well.  She had a Ct Scan of her chest on 3/29 and it showed a 6 mm subcutaneous nodule which may represent a LN.  There was mild surrounding edema, possibly representing cellulitis.  She denies any redness in the skin then or now.  The size of the nodule has not changed and is tender intermittently.  She denies any other nodules.    She has been experiencing frequent urination and incontinence and would like to see urology.   She has been taking a fraction of her mobic pill as needed for arthritis and it helps.  She wanted to know if she could continue it.     Medications and allergies reviewed with patient and updated if appropriate.  Patient Active Problem List   Diagnosis Date Noted  . Osteoarthritis 02/16/2017  . Chest pain 09/01/2016  . Chronic venous insufficiency 08/20/2016  . Bilateral leg edema 03/05/2016  . Xiphoid pain 03/05/2016  . Insomnia 01/16/2016  . Anxiety and depression 12/24/2015  . Seasonal and perennial allergic rhinitis 11/24/2015  . Asthma, mild intermittent, well-controlled 11/24/2015  . Occlusion and stenosis of carotid artery without mention of cerebral infarction 06/29/2011  . Hypothyroidism 11/11/2010  . Hyperlipidemia 11/11/2010  . Senile osteoporosis 11/11/2010  . RHINOSINUSITIS, RECURRENT 10/29/2008  . Personal history of malignant neoplasm of breast 03/27/2008  . GERD 03/27/2008  . DIVERTICULOSIS 03/27/2008  . Coronary atherosclerosis 08/23/2007  . Aortic stenosis, moderate 08/23/2007  . DVT 08/23/2007    Current Outpatient Prescriptions on File Prior to Visit  Medication Sig Dispense Refill  . aspirin 81 MG tablet Take 81 mg by mouth daily.        Marland Kitchen atorvastatin (LIPITOR) 20 MG tablet Take 1 tablet by mouth  daily 90 tablet 3  . Calcium Carbonate (CALTRATE 600 PO) Take 2 tablets by mouth daily.      . chlorpheniramine (CHLOR-TRIMETON) 4 MG tablet Take 4 mg by mouth daily as needed for allergies.    . Coenzyme Q10 (CO Q-10 PO) Take 50 mg by mouth daily.      . fluticasone (FLONASE) 50 MCG/ACT nasal spray INSTILL 1 TO 2 SPRAYS IN EACH NOSTRIL ONCE OR TWICE DAILY 16 g 0  . levothyroxine (SYNTHROID, LEVOTHROID) 75 MCG tablet TAKE 1 TABLET BY MOUTH  DAILY BEFORE BREAKFAST 90 tablet 1  . metoprolol tartrate (LOPRESSOR) 25 MG tablet Take 1 tablet by mouth two  times daily 180 tablet 3  . multivitamin (THERAGRAN) per tablet Take 1 tablet by mouth daily.      . pantoprazole (PROTONIX) 40 MG tablet Take 40 mg by mouth daily.    Marland Kitchen senna-docusate (SENOKOT-S) 8.6-50 MG per tablet Take 1 tablet by mouth daily.      Marland Kitchen zolpidem (AMBIEN) 10 MG tablet Take 10 mg by mouth at bedtime as needed for sleep.     No current facility-administered medications on file prior to visit.     Past Medical History:  Diagnosis Date  . ALLERGIC RHINITIS   . Allergy    SEASONAL  . Aortic stenosis, moderate   . Blood transfusion without reported  diagnosis   . BREAST CANCER 1991   left, 1991; B mastectomy  . Cataract    BILATERAL-REMOVED  . Coronary artery disease    s/p CABGx5 2007  . CORONARY ARTERY DISEASE   . Depression   . DIVERTICULOSIS   . DVT   . EROSIVE GASTRITIS   . GERD   . Heart murmur   . History of DVT (deep vein thrombosis)   . HYPERLIPIDEMIA   . Hypertension   . HYPOTHYROIDISM   . Occlusion and stenosis of carotid artery without mention of cerebral infarction   . Osteopenia   . OSTEOPOROSIS   . RHINOSINUSITIS, RECURRENT     Past Surgical History:  Procedure Laterality Date  . APPENDECTOMY    . COLONOSCOPY    . CORONARY ARTERY BYPASS GRAFT     5        2007  . MASTECTOMY     left with reconstruction and lymph node excision  1992   . MASTECTOMY     right with reconstruction  . REVISION RECONSTRUCTED BREAST Left 02/2014   willard (plastics in [place])  . right ankle arthroscopy    . TONSILLECTOMY AND ADENOIDECTOMY    . UPPER GASTROINTESTINAL ENDOSCOPY    . VAGINAL HYSTERECTOMY     ovaries not excised    Social History   Social History  . Marital status: Divorced    Spouse name: N/A  . Number of children: N/A  . Years of education: N/A   Social History Main Topics  . Smoking status: Never Smoker  . Smokeless tobacco: Never Used  . Alcohol use No  . Drug use: No  . Sexual activity: Not Asked   Other Topics Concern  . 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 Colon cancer Sister   . Colon cancer Sister   . Breast cancer Mother   . Lung disease Mother   . Emphysema Father     Review of Systems  Constitutional: Negative for chills and fever.  Respiratory: Negative for cough, shortness of breath and wheezing.   Genitourinary: Positive for frequency. Negative for dysuria and hematuria.  Skin: Negative for color change and rash.       Objective:   Vitals:   02/16/17 1123  BP: (!) 148/84  Pulse: 61  Resp: 16  Temp: 97.3 F (36.3 C)   Filed Weights   02/16/17 1123  Weight: 140 lb (63.5 kg)   Body mass index is 23.3 kg/m.  Wt Readings from Last 3 Encounters:  02/16/17 140 lb (63.5 kg)  01/28/17 140 lb (63.5 kg)  09/01/16 135 lb (61.2 kg)     Physical Exam  Constitutional: She appears well-developed and well-nourished. No distress.  Pulmonary/Chest:  M&M size nodule left upper chest palpable, minimally tender, no other palpable lesions/nodules  Skin: Skin is warm and dry. No rash noted. She is not diaphoretic. No erythema.          Assessment & Plan:   See Problem List for Assessment and Plan of chronic medical problems.

## 2017-02-17 ENCOUNTER — Encounter: Payer: Self-pay | Admitting: Internal Medicine

## 2017-02-23 DIAGNOSIS — Z961 Presence of intraocular lens: Secondary | ICD-10-CM | POA: Diagnosis not present

## 2017-02-23 DIAGNOSIS — H1859 Other hereditary corneal dystrophies: Secondary | ICD-10-CM | POA: Diagnosis not present

## 2017-03-02 ENCOUNTER — Ambulatory Visit (INDEPENDENT_AMBULATORY_CARE_PROVIDER_SITE_OTHER): Payer: Medicare Other | Admitting: Internal Medicine

## 2017-03-02 ENCOUNTER — Encounter: Payer: Self-pay | Admitting: Internal Medicine

## 2017-03-02 ENCOUNTER — Ambulatory Visit (INDEPENDENT_AMBULATORY_CARE_PROVIDER_SITE_OTHER)
Admission: RE | Admit: 2017-03-02 | Discharge: 2017-03-02 | Disposition: A | Discharge status: Home / Self Care | Payer: Medicare Other | Source: Ambulatory Visit | Attending: Internal Medicine | Admitting: Internal Medicine

## 2017-03-02 VITALS — BP 118/74 | HR 61 | Temp 97.7°F | Resp 16 | Wt 139.0 lb

## 2017-03-02 DIAGNOSIS — M545 Low back pain: Secondary | ICD-10-CM | POA: Diagnosis not present

## 2017-03-02 DIAGNOSIS — M546 Pain in thoracic spine: Secondary | ICD-10-CM | POA: Diagnosis not present

## 2017-03-02 DIAGNOSIS — M549 Dorsalgia, unspecified: Secondary | ICD-10-CM

## 2017-03-02 DIAGNOSIS — W19XXXA Unspecified fall, initial encounter: Secondary | ICD-10-CM | POA: Insufficient documentation

## 2017-03-02 DIAGNOSIS — S299XXA Unspecified injury of thorax, initial encounter: Secondary | ICD-10-CM | POA: Diagnosis not present

## 2017-03-02 DIAGNOSIS — S3992XA Unspecified injury of lower back, initial encounter: Secondary | ICD-10-CM | POA: Diagnosis not present

## 2017-03-02 NOTE — Progress Notes (Signed)
Pre visit review using our clinic review tool, if applicable. No additional management support is needed unless otherwise documented below in the visit note. 

## 2017-03-02 NOTE — Assessment & Plan Note (Signed)
She fell last night when getting out of bed in the middle the night Hit her hip and back on her bedroom furniture Has pain, swelling and ecchymosis Will check x-rays Continue Tylenol for pain Call or return if pain is not improved or worsens

## 2017-03-02 NOTE — Assessment & Plan Note (Addendum)
She fell last night in the middle of the night - her feet got tangled up in her bedroom slippers Discussed fall prevention-we will start balance exercises Discussed slowing down to avoid falls Will no longer use slippers Fall prevention information given She deferred PT

## 2017-03-02 NOTE — Progress Notes (Signed)
Subjective:    Patient ID: [name], female    DOB: 28-Aug-1941, 76 y.o.   MRN: [id number]  HPI She is here for an acute visit.   Back pain:  Yesterday she was getting up from bed in the middle of the night and fell.  She was trying to put on her bedroom slippers and feels her feet got tangled. She hit her left hip on the dresser, right middle back on the night stand and fell on the floor.  She took tylenol and went back to bed.  She is sore, but has only taken tylenol.  She denies any other injuries. She denies loss of consciousness. After the fall she felt and denies any numbness, tingling or weakness.  She occasionally feels unbalanced during the day.  She thinks she needs to slow down.  She walks on a regular basis.  Medications and allergies reviewed with patient and updated if appropriate.  Patient Active Problem List   Diagnosis Date Noted  . Osteoarthritis 02/16/2017  . Nodule of chest wall 02/16/2017  . Urinary incontinence 02/16/2017  . Chest pain 09/01/2016  . Chronic venous insufficiency 08/20/2016  . Bilateral leg edema 03/05/2016  . Xiphoid pain 03/05/2016  . Insomnia 01/16/2016  . Anxiety and depression 12/24/2015  . Seasonal and perennial allergic rhinitis 11/24/2015  . Asthma, mild intermittent, well-controlled 11/24/2015  . Occlusion and stenosis of carotid artery without mention of cerebral infarction 06/29/2011  . Hypothyroidism 11/11/2010  . Hyperlipidemia 11/11/2010  . Senile osteoporosis 11/11/2010  . RHINOSINUSITIS, RECURRENT 10/29/2008  . Personal history of malignant neoplasm of breast 03/27/2008  . GERD 03/27/2008  . DIVERTICULOSIS 03/27/2008  . Coronary atherosclerosis 08/23/2007  . Aortic stenosis, moderate 08/23/2007  . DVT 08/23/2007    Current Outpatient Prescriptions on File Prior to Visit  Medication Sig Dispense Refill  . aspirin 81 MG tablet Take 81 mg by mouth daily.      Marland Kitchen atorvastatin (LIPITOR) 20 MG tablet Take 1 tablet by mouth   daily 90 tablet 3  . Calcium Carbonate (CALTRATE 600 PO) Take 2 tablets by mouth daily.      . chlorpheniramine (CHLOR-TRIMETON) 4 MG tablet Take 4 mg by mouth daily as needed for allergies.    . Coenzyme Q10 (CO Q-10 PO) Take 50 mg by mouth daily.      . fluticasone (FLONASE) 50 MCG/ACT nasal spray INSTILL 1 TO 2 SPRAYS IN EACH NOSTRIL ONCE OR TWICE DAILY 16 g 0  . levothyroxine (SYNTHROID, LEVOTHROID) 75 MCG tablet TAKE 1 TABLET BY MOUTH  DAILY BEFORE BREAKFAST 90 tablet 1  . meloxicam (MOBIC) 15 MG tablet Take 1 tablet (15 mg total) by mouth daily. 90 tablet 1  . metoprolol tartrate (LOPRESSOR) 25 MG tablet Take 1 tablet by mouth two  times daily 180 tablet 3  . multivitamin (THERAGRAN) per tablet Take 1 tablet by mouth daily.      Marland Kitchen senna-docusate (SENOKOT-S) 8.6-50 MG per tablet Take 1 tablet by mouth daily.      Marland Kitchen zolpidem (AMBIEN) 10 MG tablet Take 10 mg by mouth at bedtime as needed for sleep.     No current facility-administered medications on file prior to visit.     Past Medical History:  Diagnosis Date  . ALLERGIC RHINITIS   . Allergy    SEASONAL  . Aortic stenosis, moderate   . Blood transfusion without reported diagnosis   . BREAST CANCER 1991   left, 1991; B mastectomy  .  Cataract    BILATERAL-REMOVED  . Coronary artery disease    s/p CABGx5 2007  . CORONARY ARTERY DISEASE   . Depression   . DIVERTICULOSIS   . DVT   . EROSIVE GASTRITIS   . GERD   . Heart murmur   . History of DVT (deep vein thrombosis)   . HYPERLIPIDEMIA   . Hypertension   . HYPOTHYROIDISM   . Occlusion and stenosis of carotid artery without mention of cerebral infarction   . Osteopenia   . OSTEOPOROSIS   . RHINOSINUSITIS, RECURRENT     Past Surgical History:  Procedure Laterality Date  . APPENDECTOMY    . COLONOSCOPY    . CORONARY ARTERY BYPASS GRAFT     5        2007  . MASTECTOMY     left with reconstruction and lymph node excision  1992  . MASTECTOMY     right with  reconstruction  . REVISION RECONSTRUCTED BREAST Left 02/2014   willard (plastics in [place])  . right ankle arthroscopy    . TONSILLECTOMY AND ADENOIDECTOMY    . UPPER GASTROINTESTINAL ENDOSCOPY    . VAGINAL HYSTERECTOMY     ovaries not excised    Social History   Social History  . Marital status: Divorced    Spouse name: N/A  . Number of children: N/A  . Years of education: N/A   Social History Main Topics  . Smoking status: Never Smoker  . Smokeless tobacco: Never Used  . Alcohol use No  . Drug use: No  . Sexual activity: Not on file   Other Topics Concern  . 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 Colon cancer Sister   . Colon cancer Sister   . Breast cancer Mother   . Lung disease Mother   . Emphysema Father     Review of Systems  Respiratory: Negative for shortness of breath.   Cardiovascular: Negative for chest pain and palpitations.  Musculoskeletal: Positive for back pain and gait problem.  Neurological: Negative for weakness, light-headedness, numbness and headaches.       Objective:   Vitals:   03/02/17 1610  BP: 118/74  Pulse: 61  Resp: 16  Temp: 97.7 F (36.5 C)   Filed Weights   03/02/17 1610  Weight: 139 lb (63 kg)   Body mass index is 23.13 kg/m.  Wt Readings from Last 3 Encounters:  03/02/17 139 lb (63 kg)  02/16/17 140 lb (63.5 kg)  01/28/17 140 lb (63.5 kg)     Physical Exam  Constitutional: She appears well-developed and well-nourished. No distress.  HENT:  Head: Normocephalic and atraumatic.  Musculoskeletal: She exhibits no edema.  Tenderness, ecchymosis and mild swelling left lower back extending out to left hip and right mid-lower back just lateral of spine; upper lumbar region tender to palpation  Skin: She is not diaphoretic.  Bruising left lateral, left lower back and  right mid-lower back          Assessment & Plan:   See Problem List for Assessment and Plan of chronic medical problems.

## 2017-03-02 NOTE — Patient Instructions (Addendum)
Have xrays today of your back.   Look up balance exercises - call if you want to do PT.    Fall Prevention in the Home Falls can cause injuries and can affect people from all age groups. There are many simple things that you can do to make your home safe and to help prevent falls. What can I do on the outside of my home?  Regularly repair the edges of walkways and driveways and fix any cracks.  Remove high doorway thresholds.  Trim any shrubbery on the main path into your home.  Use bright outdoor lighting.  Clear walkways of debris and clutter, including tools and rocks.  Regularly check that handrails are securely fastened and in good repair. Both sides of any steps should have handrails.  Install guardrails along the edges of any raised decks or porches.  Have leaves, snow, and ice cleared regularly.  Use sand or salt on walkways during winter months.  In the garage, clean up any spills right away, including grease or oil spills. What can I do in the bathroom?  Use night lights.  Install grab bars by the toilet and in the tub and shower. Do not use towel bars as grab bars.  Use non-skid mats or decals on the floor of the tub or shower.  If you need to sit down while you are in the shower, use a plastic, non-slip stool.  Keep the floor dry. Immediately clean up any water that spills on the floor.  Remove soap buildup in the tub or shower on a regular basis.  Attach bath mats securely with double-sided non-slip rug tape.  Remove throw rugs and other tripping hazards from the floor. What can I do in the bedroom?  Use night lights.  Make sure that a bedside light is easy to reach.  Do not use oversized bedding that drapes onto the floor.  Have a firm chair that has side arms to use for getting dressed.  Remove throw rugs and other tripping hazards from the floor. What can I do in the kitchen?  Clean up any spills right away.  Avoid walking on wet  floors.  Place frequently used items in easy-to-reach places.  If you need to reach for something above you, use a sturdy step stool that has a grab bar.  Keep electrical cables out of the way.  Do not use floor polish or wax that makes floors slippery. If you have to use wax, make sure that it is non-skid floor wax.  Remove throw rugs and other tripping hazards from the floor. What can I do in the stairways?  Do not leave any items on the stairs.  Make sure that there are handrails on both sides of the stairs. Fix handrails that are broken or loose. Make sure that handrails are as long as the stairways.  Check any carpeting to make sure that it is firmly attached to the stairs. Fix any carpet that is loose or worn.  Avoid having throw rugs at the top or bottom of stairways, or secure the rugs with carpet tape to prevent them from moving.  Make sure that you have a light switch at the top of the stairs and the bottom of the stairs. If you do not have them, have them installed. What are some other fall prevention tips?  Wear closed-toe shoes that fit well and support your feet. Wear shoes that have rubber soles or low heels.  When you use a stepladder,  make sure that it is completely opened and that the sides are firmly locked. Have someone hold the ladder while you are using it. Do not climb a closed stepladder.  Add color or contrast paint or tape to grab bars and handrails in your home. Place contrasting color strips on the first and last steps.  Use mobility aids as needed, such as canes, walkers, scooters, and crutches.  Turn on lights if it is dark. Replace any light bulbs that burn out.  Set up furniture so that there are clear paths. Keep the furniture in the same spot.  Fix any uneven floor surfaces.  Choose a carpet design that does not hide the edge of steps of a stairway.  Be aware of any and all pets.  Review your medicines with your healthcare provider. Some  medicines can cause dizziness or changes in blood pressure, which increase your risk of falling. Talk with your health care provider about other ways that you can decrease your risk of falls. This may include working with a physical therapist or trainer to improve your strength, balance, and endurance. This information is not intended to replace advice given to you by your health care provider. Make sure you discuss any questions you have with your health care provider. Document Released: 10/22/2002 Document Revised: 03/30/2016 Document Reviewed: 12/06/2014 Elsevier Interactive Patient Education  2017 [employer].

## 2017-03-17 ENCOUNTER — Ambulatory Visit (INDEPENDENT_AMBULATORY_CARE_PROVIDER_SITE_OTHER): Payer: Medicare Other

## 2017-03-17 ENCOUNTER — Ambulatory Visit (INDEPENDENT_AMBULATORY_CARE_PROVIDER_SITE_OTHER): Payer: Medicare Other | Admitting: Podiatry

## 2017-03-17 ENCOUNTER — Encounter: Payer: Self-pay | Admitting: Podiatry

## 2017-03-17 VITALS — Resp 16 | Ht 64.0 in | Wt 135.0 lb

## 2017-03-17 DIAGNOSIS — M21619 Bunion of unspecified foot: Secondary | ICD-10-CM | POA: Diagnosis not present

## 2017-03-17 DIAGNOSIS — L84 Corns and callosities: Secondary | ICD-10-CM

## 2017-03-17 DIAGNOSIS — M2041 Other hammer toe(s) (acquired), right foot: Secondary | ICD-10-CM

## 2017-03-17 DIAGNOSIS — M2042 Other hammer toe(s) (acquired), left foot: Secondary | ICD-10-CM

## 2017-03-17 NOTE — Progress Notes (Signed)
   Subjective:    Patient ID: [name], female    DOB: 1941-04-04, 76 y.o.   MRN: [id number]  HPI Chief Complaint  Patient presents with  . Nail Problem    Right foot; great toe; pt stated, "My nail is black; hurts to touch nail; feels tender"; x  . Toe Pain    Bilateral; great toes going over 2nd toes; x6 months      Review of Systems  HENT: Positive for hearing loss.   Eyes: Positive for pain, redness, itching and visual disturbance.  Genitourinary: Positive for decreased urine volume and frequency.  Hematological: Bruises/bleeds easily.  All other systems reviewed and are negative.      Objective:   Physical Exam        Assessment & Plan:

## 2017-03-18 NOTE — Progress Notes (Signed)
Subjective:    Patient ID: [name], female   DOB: 76 y.o.   MRN: [id number]   HPI patient presents with several problems including nail disease of the right great toe of several months duration with minimal discomfort and structural changes with bunion deformity bilateral and overlapping second digits which are bothersome with certain shoes    Review of Systems  All other systems reviewed and are negative.       Objective:  Physical Exam  Cardiovascular: Intact distal pulses.   Musculoskeletal: Normal range of motion.  Neurological: She is alert.  Skin: Skin is warm and dry.  Nursing note and vitals reviewed.  neurovascular status found to be mildly diminished but intact with sharp dull and vibratory intact. Patient's found to have structural deformity with bunion deformity bilateral and rotated hallux bilateral with distal lateral keratotic tissue formation and irritation with overlapping second digit bilateral nail on the right hallux has slight discoloration lateral side but it appears to be more of a traumatic orientation with minimal discomfort upon palpation     Assessment:     Significant structural malalignment of both feet with probable trauma to the right hallux nail    Plan:    H&P and I reviewed condition. I do not recommend surgery and less it were to get worse and I would not take this nail off and less it were to become painful or dark and further. I did explain trauma and that the only way Akin sure what it is just pathology but she's comfortable with this and does not want to undertake pathology at the current time

## 2017-03-29 DIAGNOSIS — Z9013 Acquired absence of bilateral breasts and nipples: Secondary | ICD-10-CM | POA: Diagnosis not present

## 2017-03-29 DIAGNOSIS — Z803 Family history of malignant neoplasm of breast: Secondary | ICD-10-CM | POA: Diagnosis not present

## 2017-03-29 DIAGNOSIS — R222 Localized swelling, mass and lump, trunk: Secondary | ICD-10-CM | POA: Diagnosis not present

## 2017-03-29 DIAGNOSIS — Z853 Personal history of malignant neoplasm of breast: Secondary | ICD-10-CM | POA: Diagnosis not present

## 2017-03-29 DIAGNOSIS — Z951 Presence of aortocoronary bypass graft: Secondary | ICD-10-CM | POA: Diagnosis not present

## 2017-03-30 ENCOUNTER — Ambulatory Visit (AMBULATORY_SURGERY_CENTER): Payer: Self-pay

## 2017-03-30 VITALS — Ht 64.0 in | Wt 140.0 lb

## 2017-03-30 DIAGNOSIS — Z800 Family history of malignant neoplasm of digestive organs: Secondary | ICD-10-CM

## 2017-03-30 MED ORDER — NA SULFATE-K SULFATE-MG SULF 17.5-3.13-1.6 GM/177ML PO SOLN: 1.0000 | Freq: Once | ORAL | 0 refills | Status: AC

## 2017-03-30 NOTE — Progress Notes (Signed)
Denies allergies to eggs or soy products. Denies complication of anesthesia or sedation. Denies use of weight loss medication. Denies use of O2.   Emmi instructions declined, patient has seen it.

## 2017-04-01 ENCOUNTER — Telehealth: Payer: Self-pay | Admitting: Internal Medicine

## 2017-04-01 NOTE — Telephone Encounter (Signed)
Pt calling and saying her prep is $46 dollars and she absolutely cannot afford that.   She says that's with 2 insurances and that so expensive for her  Offered sample prep- instructed her to pick this up 4th floor [facility] by Friday next week as her procedure is 04-12-17  Medication Samples have been provided to the patient.  Drug name: suprep            Qty: 1  LOT: [id number]  Exp.Date: 4/20  Dosing instructions: as directed at [facility]   The patient has been instructed regarding the correct time, dose, and frequency of taking this medication, including desired effects and most common side effects.   Hetty Ely Widener 3:58 PM 04/01/2017

## 2017-04-05 DIAGNOSIS — M810 Age-related osteoporosis without current pathological fracture: Secondary | ICD-10-CM | POA: Diagnosis not present

## 2017-04-05 LAB — HM DEXA SCAN

## 2017-04-06 DIAGNOSIS — N3944 Nocturnal enuresis: Secondary | ICD-10-CM | POA: Diagnosis not present

## 2017-04-06 DIAGNOSIS — R351 Nocturia: Secondary | ICD-10-CM | POA: Diagnosis not present

## 2017-04-06 DIAGNOSIS — R350 Frequency of micturition: Secondary | ICD-10-CM | POA: Diagnosis not present

## 2017-04-12 ENCOUNTER — Encounter: Payer: Self-pay | Admitting: Internal Medicine

## 2017-04-12 ENCOUNTER — Ambulatory Visit (AMBULATORY_SURGERY_CENTER): Payer: Medicare Other | Admitting: Internal Medicine

## 2017-04-12 VITALS — BP 141/89 | HR 70 | Temp 97.5°F | Resp 23 | Ht 64.0 in | Wt 140.0 lb

## 2017-04-12 DIAGNOSIS — I10 Essential (primary) hypertension: Secondary | ICD-10-CM | POA: Diagnosis not present

## 2017-04-12 DIAGNOSIS — K5730 Diverticulosis of large intestine without perforation or abscess without bleeding: Secondary | ICD-10-CM | POA: Diagnosis not present

## 2017-04-12 DIAGNOSIS — D123 Benign neoplasm of transverse colon: Secondary | ICD-10-CM | POA: Diagnosis not present

## 2017-04-12 DIAGNOSIS — Z1211 Encounter for screening for malignant neoplasm of colon: Secondary | ICD-10-CM | POA: Diagnosis not present

## 2017-04-12 DIAGNOSIS — Z800 Family history of malignant neoplasm of digestive organs: Secondary | ICD-10-CM

## 2017-04-12 DIAGNOSIS — D122 Benign neoplasm of ascending colon: Secondary | ICD-10-CM

## 2017-04-12 DIAGNOSIS — Z1212 Encounter for screening for malignant neoplasm of rectum: Secondary | ICD-10-CM | POA: Diagnosis not present

## 2017-04-12 DIAGNOSIS — I2510 Atherosclerotic heart disease of native coronary artery without angina pectoris: Secondary | ICD-10-CM | POA: Diagnosis not present

## 2017-04-12 DIAGNOSIS — K635 Polyp of colon: Secondary | ICD-10-CM | POA: Diagnosis not present

## 2017-04-12 DIAGNOSIS — Z951 Presence of aortocoronary bypass graft: Secondary | ICD-10-CM | POA: Diagnosis not present

## 2017-04-12 MED ORDER — SODIUM CHLORIDE 0.9 % IV SOLN: 500.0000 mL | INTRAVENOUS | Status: DC

## 2017-04-12 NOTE — Progress Notes (Signed)
Pt's states no medical or surgical changes since previsit or office visit. 

## 2017-04-12 NOTE — Patient Instructions (Signed)
Discharge instructions given. Handouts on polyps and diverticulosis. Resume previous medications. YOU HAD AN ENDOSCOPIC PROCEDURE TODAY AT THE [facility]:   Refer to the procedure report that was given to you for any specific questions about what was found during the examination.  If the procedure report does not answer your questions, please call your gastroenterologist to clarify.  If you requested that your care partner not be given the details of your procedure findings, then the procedure report has been included in a sealed envelope for you to review at your convenience later.  YOU SHOULD EXPECT: Some feelings of bloating in the abdomen. Passage of more gas than usual.  Walking can help get rid of the air that was put into your GI tract during the procedure and reduce the bloating. If you had a lower endoscopy (such as a colonoscopy or flexible sigmoidoscopy) you may notice spotting of blood in your stool or on the toilet paper. If you underwent a bowel prep for your procedure, you may not have a normal bowel movement for a few days.  Please Note:  You might notice some irritation and congestion in your nose or some drainage.  This is from the oxygen used during your procedure.  There is no need for concern and it should clear up in a day or so.  SYMPTOMS TO REPORT IMMEDIATELY:   Following lower endoscopy (colonoscopy or flexible sigmoidoscopy):  Excessive amounts of blood in the stool  Significant tenderness or worsening of abdominal pains  Swelling of the abdomen that is new, acute  Fever of 100F or higher   For urgent or emergent issues, a gastroenterologist can be reached at any hour by calling [phone].   DIET:  We do recommend a small meal at first, but then you may proceed to your regular diet.  Drink plenty of fluids but you should avoid alcoholic beverages for 24 hours.  ACTIVITY:  You should plan to take it easy for the rest of today and you should NOT  DRIVE or use heavy machinery until tomorrow (because of the sedation medicines used during the test).    FOLLOW UP: Our staff will call the number listed on your records the next business day following your procedure to check on you and address any questions or concerns that you may have regarding the information given to you following your procedure. If we do not reach you, we will leave a message.  However, if you are feeling well and you are not experiencing any problems, there is no need to return our call.  We will assume that you have returned to your regular daily activities without incident.  If any biopsies were taken you will be contacted by phone or by letter within the next 1-3 weeks.  Please call us at [phone] if you have not heard about the biopsies in 3 weeks.    SIGNATURES/CONFIDENTIALITY: You and/or your care partner have signed paperwork which will be entered into your electronic medical record.  These signatures attest to the fact that that the information above on your After Visit Summary has been reviewed and is understood.  Full responsibility of the confidentiality of this discharge information lies with you and/or your care-partner. 

## 2017-04-12 NOTE — Op Note (Signed)
[facility] Patient Name: [name] Procedure Date: 04/12/2017 1:53 PM MRN: [id number] Endoscopist: Docia Chuck. Henrene Pastor , MD Age: 76 Referring MD:  Date of Birth: 1941/10/24 Gender: Female Account #: [id number] Procedure:                Colonoscopy, with cold snare polypectomy x-ray Indications:              Screening patient at increased risk: Family history                            of colorectal cancer in multiple 1st-degree                            relatives Medicines:                Monitored Anesthesia Care Procedure:                Pre-Anesthesia Assessment:                           - Prior to the procedure, a History and Physical                            was performed, and patient medications and                            allergies were reviewed. The patient's tolerance of                            previous anesthesia was also reviewed. The risks                            and benefits of the procedure and the sedation                            options and risks were discussed with the patient.                            All questions were answered, and informed consent                            was obtained. Prior Anticoagulants: The patient has                            taken no previous anticoagulant or antiplatelet                            agents. ASA Grade Assessment: II - A patient with                            mild systemic disease. After reviewing the risks                            and benefits, the patient was deemed in  satisfactory condition to undergo the procedure.                           After obtaining informed consent, the colonoscope                            was passed under direct vision. Throughout the                            procedure, the patient's blood pressure, pulse, and                            oxygen saturations were monitored continuously. The                            Model PCF-H190DL  [id number]) scope was introduced                            through the anus and advanced to the the cecum,                            identified by appendiceal orifice and ileocecal                            valve. The ileocecal valve, appendiceal orifice,                            and rectum were photographed. The quality of the                            bowel preparation was excellent. The colonoscopy                            was performed without difficulty. The patient                            tolerated the procedure well. The bowel preparation                            used was SUPREP. Scope In: 2:08:50 PM Scope Out: 2:30:43 PM Scope Withdrawal Time: 0 hours 15 minutes 52 seconds  Total Procedure Duration: 0 hours 21 minutes 53 seconds  Findings:                 Three polyps were found in the transverse colon and                            ascending colon. The polyps were 2 to 5 mm in size.                            These polyps were removed with a cold snare.                            Resection and retrieval were complete.  An area of moderate melanosis was found in the                            entire colon.                           Multiple diverticula were found in the sigmoid                            colon.                           The exam was otherwise without abnormality on                            direct and retroflexion views. Complications:            No immediate complications. Estimated blood loss:                            None. Estimated Blood Loss:     Estimated blood loss: none. Impression:               - Three 2 to 5 mm polyps in the transverse colon                            and in the ascending colon, removed with a cold                            snare. Resected and retrieved.                           - Melanosis in the colon.                           - Diverticulosis in the sigmoid colon.                            - The examination was otherwise normal on direct                            and retroflexion views. Recommendation:           - Repeat colonoscopy in 5 years for surveillance.                           - Patient has a contact number available for                            emergencies. The signs and symptoms of potential                            delayed complications were discussed with the                            patient. Return to normal activities tomorrow.  Written discharge instructions were provided to the                            patient.                           - Resume previous diet.                           - Continue present medications.                           - Await pathology results. Docia Chuck. Henrene Pastor, MD 04/12/2017 2:37:32 PM This report has been signed electronically.

## 2017-04-12 NOTE — Progress Notes (Signed)
A/ox3 pleased with MAC, report to [facility]

## 2017-04-12 NOTE — Progress Notes (Signed)
Called to room to assist during endoscopic procedure.  Patient ID and intended procedure confirmed with present staff. Received instructions for my participation in the procedure from the performing physician.  

## 2017-04-12 NOTE — Progress Notes (Signed)
Abdomin soft and non distended. Unable to expel air. Denies pain or discomfort. Rectal tube inserted without any air expel. Instructed to move around at home. Hot coffee hot tea. Patient stated have gas ex at home.

## 2017-04-13 ENCOUNTER — Telehealth: Payer: Self-pay | Admitting: *Deleted

## 2017-04-13 NOTE — Telephone Encounter (Signed)
  Follow up Call-  Call back number 04/12/2017 02/10/2016  Post procedure Call Back phone  # [phone]  Permission to leave phone message Yes Yes  Some recent data might be hidden     Patient questions:  Do you have a fever, pain , or abdominal swelling? No. Pain Score  0 *  Have you tolerated food without any problems? Yes.    Have you been able to return to your normal activities? Yes.    Do you have any questions about your discharge instructions: Diet   No. Medications  No. Follow up visit  No.  Do you have questions or concerns about your Care? No.  Actions: * If pain score is 4 or above: No action needed, pain <4.

## 2017-04-16 ENCOUNTER — Telehealth: Payer: Self-pay | Admitting: Internal Medicine

## 2017-04-16 DIAGNOSIS — M810 Age-related osteoporosis without current pathological fracture: Secondary | ICD-10-CM

## 2017-04-16 NOTE — Telephone Encounter (Signed)
Dexa scan shows osteoporosis - we need to consider medication. She should make an appointment to discuss.

## 2017-04-18 ENCOUNTER — Other Ambulatory Visit: Payer: Self-pay | Admitting: General Surgery

## 2017-04-18 ENCOUNTER — Encounter: Payer: Self-pay | Admitting: Internal Medicine

## 2017-04-18 DIAGNOSIS — D235 Other benign neoplasm of skin of trunk: Secondary | ICD-10-CM | POA: Diagnosis not present

## 2017-04-18 DIAGNOSIS — M7989 Other specified soft tissue disorders: Secondary | ICD-10-CM | POA: Diagnosis not present

## 2017-04-18 DIAGNOSIS — Z853 Personal history of malignant neoplasm of breast: Secondary | ICD-10-CM | POA: Diagnosis not present

## 2017-04-19 NOTE — Telephone Encounter (Signed)
Spoke with pt, appt scheduled.  

## 2017-04-20 ENCOUNTER — Other Ambulatory Visit: Payer: Medicare Other | Admitting: *Deleted

## 2017-04-20 ENCOUNTER — Other Ambulatory Visit: Payer: Self-pay | Admitting: Cardiovascular Disease

## 2017-04-20 DIAGNOSIS — E785 Hyperlipidemia, unspecified: Secondary | ICD-10-CM | POA: Diagnosis not present

## 2017-04-20 DIAGNOSIS — I10 Essential (primary) hypertension: Secondary | ICD-10-CM | POA: Diagnosis not present

## 2017-04-20 DIAGNOSIS — I350 Nonrheumatic aortic (valve) stenosis: Secondary | ICD-10-CM

## 2017-04-20 LAB — COMPREHENSIVE METABOLIC PANEL
A/G RATIO: 1.7 (ref 1.2–2.2)
ALBUMIN: 4 g/dL (ref 3.5–4.8)
ALK PHOS: 66 IU/L (ref 39–117)
ALT: 20 IU/L (ref 0–32)
AST: 28 IU/L (ref 0–40)
BILIRUBIN TOTAL: 1.2 mg/dL (ref 0.0–1.2)
BUN / CREAT RATIO: 22 (ref 12–28)
BUN: 15 mg/dL (ref 8–27)
CHLORIDE: 100 mmol/L (ref 96–106)
CO2: 25 mmol/L (ref 18–29)
CREATININE: 0.67 mg/dL (ref 0.57–1.00)
Calcium: 9.7 mg/dL (ref 8.7–10.3)
GFR calc Af Amer: 97 mL/min/{1.73_m2} (ref >59)
GFR calc non Af Amer: 84 mL/min/{1.73_m2} (ref >59)
GLOBULIN, TOTAL: 2.3 g/dL (ref 1.5–4.5)
Glucose: 84 mg/dL (ref 65–99)
Potassium: 4.5 mmol/L (ref 3.5–5.2)
SODIUM: 139 mmol/L (ref 134–144)
Total Protein: 6.3 g/dL (ref 6.0–8.5)

## 2017-04-20 LAB — LIPID PANEL
CHOLESTEROL TOTAL: 125 mg/dL (ref 100–199)
Chol/HDL Ratio: 2.5 ratio (ref 0.0–4.4)
HDL: 51 mg/dL (ref >39)
LDL CALC: 60 mg/dL (ref 0–99)
Triglycerides: 71 mg/dL (ref 0–149)
VLDL Cholesterol Cal: 14 mg/dL (ref 5–40)

## 2017-04-21 ENCOUNTER — Encounter: Payer: Self-pay | Admitting: Internal Medicine

## 2017-04-28 ENCOUNTER — Telehealth: Payer: Self-pay | Admitting: Cardiovascular Disease

## 2017-04-28 NOTE — Telephone Encounter (Signed)
Reviewed lab results with patient who verbalized understanding. She was grateful for the call.

## 2017-04-28 NOTE — Telephone Encounter (Signed)
New message     Pt is returning Lauren call for her lab results

## 2017-05-04 ENCOUNTER — Encounter: Payer: Self-pay | Admitting: Internal Medicine

## 2017-05-04 ENCOUNTER — Ambulatory Visit (INDEPENDENT_AMBULATORY_CARE_PROVIDER_SITE_OTHER): Payer: Medicare Other | Admitting: Internal Medicine

## 2017-05-04 ENCOUNTER — Other Ambulatory Visit (INDEPENDENT_AMBULATORY_CARE_PROVIDER_SITE_OTHER): Payer: Medicare Other

## 2017-05-04 VITALS — BP 144/64 | HR 66 | Temp 97.8°F | Resp 16 | Wt 138.0 lb

## 2017-05-04 DIAGNOSIS — M810 Age-related osteoporosis without current pathological fracture: Secondary | ICD-10-CM

## 2017-05-04 DIAGNOSIS — E039 Hypothyroidism, unspecified: Secondary | ICD-10-CM

## 2017-05-04 LAB — VITAMIN D 25 HYDROXY (VIT D DEFICIENCY, FRACTURES): VITD: 53.35 ng/mL (ref 30.00–100.00)

## 2017-05-04 LAB — TSH: TSH: 1.08 u[IU]/mL (ref 0.35–4.50)

## 2017-05-04 NOTE — Patient Instructions (Signed)
Calcium citrate with vitamin d  - 600 mg of calcium twice a day with food.  Take 1000-2000 units of vitamin d daily.   If your vitamin d level is good - keep taking the same amount of  Vitamin D you are currently taking.     Start doing weights - 2 lbs with your arms a few times a week.   Keep walking.    Osteoporosis Osteoporosis is the thinning and loss of density in the bones. Osteoporosis makes the bones more brittle, fragile, and likely to break (fracture). Over time, osteoporosis can cause the bones to become so weak that they fracture after a simple fall. The bones most likely to fracture are the bones in the hip, wrist, and spine. What are the causes? The exact cause is not known. What increases the risk? Anyone can develop osteoporosis. You may be at greater risk if you have a family history of the condition or have poor nutrition. You may also have a higher risk if you are:  Female.  47 years old or older.  A smoker.  Not physically active.  White or Asian.  Slender.  What are the signs or symptoms? A fracture might be the first sign of the disease, especially if it results from a fall or injury that would not usually cause a bone to break. Other signs and symptoms include:  Low back and neck pain.  Stooped posture.  Height loss.  How is this diagnosed? To make a diagnosis, your health care provider may:  Take a medical history.  Perform a physical exam.  Order tests, such as: ? A bone mineral density test. ? A dual-energy X-ray absorptiometry test.  How is this treated? The goal of osteoporosis treatment is to strengthen your bones to reduce your risk of a fracture. Treatment may involve:  Making lifestyle changes, such as: ? Eating a diet rich in calcium. ? Doing weight-bearing and muscle-strengthening exercises. ? Stopping tobacco use. ? Limiting alcohol intake.  Taking medicine to slow the process of bone loss or to increase bone  density.  Monitoring your levels of calcium and vitamin D.  Follow these instructions at home:  Include calcium and vitamin D in your diet. Calcium is important for bone health, and vitamin D helps the body absorb calcium.  Perform weight-bearing and muscle-strengthening exercises as directed by your health care provider.  Do not use any tobacco products, including cigarettes, chewing tobacco, and electronic cigarettes. If you need help quitting, ask your health care provider.  Limit your alcohol intake.  Take medicines only as directed by your health care provider.  Keep all follow-up visits as directed by your health care provider. This is important.  Take precautions at home to lower your risk of falling, such as: ? Keeping rooms well lit and clutter free. ? Installing safety rails on stairs. ? Using rubber mats in the bathroom and other areas that are often wet or slippery. Get help right away if: You fall or injure yourself. This information is not intended to replace advice given to you by your health care provider. Make sure you discuss any questions you have with your health care provider. Document Released: 08/11/2005 Document Revised: 04/05/2016 Document Reviewed: 04/11/2014 Elsevier Interactive Patient Education  2017 [employer].

## 2017-05-04 NOTE — Assessment & Plan Note (Signed)
Check tsh  Titrate med dose if needed  

## 2017-05-04 NOTE — Progress Notes (Signed)
Subjective:    Patient ID: [name], female    DOB: Apr 08, 1941, 76 y.o.   MRN: [id number]  HPI The patient is here for follow up of osteoporosis.  She takes calcium 1500 mg daily at one time and vitamin d is in that pill.  She is walking 1-2 miles a day.  She does not do any other exercise.  She never smoked. She feels her balance is good overall.    She has had GERD in the past, but none recently.  She has never been on medication for her bones.  This is the first time she was diagnosed with osteoporosis.    Medications and allergies reviewed with patient and updated if appropriate.  Patient Active Problem List   Diagnosis Date Noted  . Acute back pain 03/02/2017  . Fall 03/02/2017  . Osteoarthritis 02/16/2017  . Nodule of chest wall 02/16/2017  . Urinary incontinence 02/16/2017  . Chest pain 09/01/2016  . Chronic venous insufficiency 08/20/2016  . Bilateral leg edema 03/05/2016  . Xiphoid pain 03/05/2016  . Insomnia 01/16/2016  . Anxiety and depression 12/24/2015  . Seasonal and perennial allergic rhinitis 11/24/2015  . Asthma, mild intermittent, well-controlled 11/24/2015  . Occlusion and stenosis of carotid artery without mention of cerebral infarction 06/29/2011  . Hypothyroidism 11/11/2010  . Hyperlipidemia 11/11/2010  . Senile osteoporosis 11/11/2010  . RHINOSINUSITIS, RECURRENT 10/29/2008  . Personal history of malignant neoplasm of breast 03/27/2008  . GERD 03/27/2008  . DIVERTICULOSIS 03/27/2008  . Coronary atherosclerosis 08/23/2007  . Aortic stenosis, moderate 08/23/2007  . DVT 08/23/2007    Current Outpatient Prescriptions on File Prior to Visit  Medication Sig Dispense Refill  . aspirin 81 MG tablet Take 81 mg by mouth daily.      Marland Kitchen atorvastatin (LIPITOR) 20 MG tablet TAKE 1 TABLET BY MOUTH  DAILY 90 tablet 2  . Calcium Carbonate (CALTRATE 600 PO) Take 1 tablet by mouth 2 (two) times daily.      . chlorpheniramine (CHLOR-TRIMETON) 4 MG tablet Take 4  mg by mouth daily as needed for allergies.    . Coenzyme Q10 (CO Q-10 PO) Take 50 mg by mouth daily.      . fluticasone (FLONASE) 50 MCG/ACT nasal spray INSTILL 1 TO 2 SPRAYS IN EACH NOSTRIL ONCE OR TWICE DAILY 16 g 0  . levothyroxine (SYNTHROID, LEVOTHROID) 75 MCG tablet TAKE 1 TABLET BY MOUTH  DAILY BEFORE BREAKFAST 90 tablet 1  . meloxicam (MOBIC) 15 MG tablet Take 1 tablet (15 mg total) by mouth daily. 90 tablet 1  . metoprolol tartrate (LOPRESSOR) 25 MG tablet TAKE 1 TABLET BY MOUTH TWO  TIMES DAILY 180 tablet 2  . multivitamin (THERAGRAN) per tablet Take 1 tablet by mouth daily.      Marland Kitchen senna-docusate (SENOKOT-S) 8.6-50 MG per tablet Take 1 tablet by mouth daily.      Marland Kitchen zolpidem (AMBIEN) 10 MG tablet Take 10 mg by mouth at bedtime as needed for sleep.     No current facility-administered medications on file prior to visit.     Past Medical History:  Diagnosis Date  . ALLERGIC RHINITIS   . Allergy    SEASONAL  . Anxiety   . Aortic stenosis, moderate   . Blood transfusion without reported diagnosis   . BREAST CANCER 1991   left, 1991; B mastectomy  . Cataract    BILATERAL-REMOVED  . Coronary artery disease    s/p CABGx5 2007  . CORONARY ARTERY DISEASE   .  Depression   . DIVERTICULOSIS   . DVT   . EROSIVE GASTRITIS   . GERD   . Heart murmur   . History of DVT (deep vein thrombosis)   . HYPERLIPIDEMIA   . Hypertension   . HYPOTHYROIDISM   . Occlusion and stenosis of carotid artery without mention of cerebral infarction   . Osteopenia   . OSTEOPOROSIS   . RHINOSINUSITIS, RECURRENT     Past Surgical History:  Procedure Laterality Date  . APPENDECTOMY    . COLONOSCOPY    . CORONARY ARTERY BYPASS GRAFT     5        2007  . MASTECTOMY     left with reconstruction and lymph node excision  1992  . MASTECTOMY     right with reconstruction  . REVISION RECONSTRUCTED BREAST Left 02/2014   [facility] (plastics in [place])  . right ankle arthroscopy    . TONSILLECTOMY AND  ADENOIDECTOMY    . UPPER GASTROINTESTINAL ENDOSCOPY    . VAGINAL HYSTERECTOMY     ovaries not excised    Social History   Social History  . Marital status: Divorced    Spouse name: N/A  . Number of children: N/A  . Years of education: N/A   Social History Main Topics  . Smoking status: Never Smoker  . Smokeless tobacco: Never Used  . Alcohol use No  . Drug use: No  . Sexual activity: Not on file   Other Topics Concern  . 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 Colon cancer Sister   . Colon cancer Sister   . Breast cancer Mother   . Lung disease Mother   . Emphysema Father   . Esophageal cancer Neg Hx   . Rectal cancer Neg Hx   . Stomach cancer Neg Hx     Review of Systems     Objective:   Vitals:   05/04/17 1551  BP: (!) 144/64  Pulse: 66  Resp: 16  Temp: 97.8 F (36.6 C)   Wt Readings from Last 3 Encounters:  05/04/17 138 lb (62.6 kg)  04/12/17 140 lb (63.5 kg)  03/30/17 140 lb (63.5 kg)   Body mass index is 23.69 kg/m.   Physical Exam         Assessment & Plan:    20 minutes were spent face-to-face with the patient, over 50% of which was spent counseling regarding her new diagnosis of osteoporosis including causes, treatment options (fosamax, reclast, prolia and reclast) and calcium, vitamin D and the importance of regular exercise.      See Problem List for Assessment and Plan of chronic medical problems.

## 2017-05-04 NOTE — Assessment & Plan Note (Addendum)
Discussed dexa Taking calcium and vitamin D  - separate to twice daily (taking all at once) - 1200 mg of calcium, 1000-2000 units of vitamin d - will adjust based on level Start using weights to improve bone density in arms Continue walking Recheck dexa in one year given changes in lifestyle/vitamins Taking tsh, vitamin D level

## 2017-05-25 ENCOUNTER — Other Ambulatory Visit: Payer: Self-pay | Admitting: Internal Medicine

## 2017-05-26 ENCOUNTER — Other Ambulatory Visit: Payer: Self-pay | Admitting: Internal Medicine

## 2017-05-26 NOTE — Telephone Encounter (Signed)
[place] Controlled Substance Database checked. Okay to fill RX. Last filled #30 on 03/14/17

## 2017-07-07 DIAGNOSIS — R350 Frequency of micturition: Secondary | ICD-10-CM | POA: Diagnosis not present

## 2017-07-07 DIAGNOSIS — N3944 Nocturnal enuresis: Secondary | ICD-10-CM | POA: Diagnosis not present

## 2017-07-12 NOTE — Progress Notes (Signed)
Pre visit review using our clinic review tool, if applicable. No additional management support is needed unless otherwise documented below in the visit note. 

## 2017-07-12 NOTE — Progress Notes (Addendum)
Subjective:   [name] is a 76 y.o. female who presents for Medicare Annual (Subsequent) preventive examination.  Review of Systems:  No ROS.  Medicare Wellness Visit. Additional risk factors are reflected in the social history.  Cardiac Risk Factors include: advanced age (>53men, >44 women) Sleep patterns: feels rested on waking, gets up 1 times nightly to void and sleeps 8-10 hours nightly.    Home Safety/Smoke Alarms: Feels safe in home. Smoke alarms in place.  Living environment; residence and Firearm Safety: 1-story house/ trailer, no firearms . Lives alone, no needs for DME, good support system Seat Belt Safety/Bike Helmet: Wears seat belt.     Objective:     Vitals: BP 127/68   Pulse 60   Resp 20   Ht 5\' 4"  (1.626 m)   Wt 139 lb (63 kg)   SpO2 96%   BMI 23.86 kg/m   Body mass index is 23.86 kg/m.   Tobacco History  Smoking Status  . Never Smoker  Smokeless Tobacco  . Never Used     Counseling given: Not Answered   Past Medical History:  Diagnosis Date  . ALLERGIC RHINITIS   . Allergy    SEASONAL  . Anxiety   . Aortic stenosis, moderate   . Blood transfusion without reported diagnosis   . BREAST CANCER 1991   left, 1991; B mastectomy  . Cataract    BILATERAL-REMOVED  . Coronary artery disease    s/p CABGx5 2007  . CORONARY ARTERY DISEASE   . Depression   . DIVERTICULOSIS   . DVT   . EROSIVE GASTRITIS   . GERD   . Heart murmur   . History of DVT (deep vein thrombosis)   . HYPERLIPIDEMIA   . Hypertension   . HYPOTHYROIDISM   . Occlusion and stenosis of carotid artery without mention of cerebral infarction   . Osteopenia   . OSTEOPOROSIS   . RHINOSINUSITIS, RECURRENT    Past Surgical History:  Procedure Laterality Date  . APPENDECTOMY    . COLONOSCOPY    . CORONARY ARTERY BYPASS GRAFT     5        2007  . MASTECTOMY     left with reconstruction and lymph node excision  1992  . MASTECTOMY     right with reconstruction  .  REVISION RECONSTRUCTED BREAST Left 02/2014   [facility] (plastics in [place])  . right ankle arthroscopy    . TONSILLECTOMY AND ADENOIDECTOMY    . UPPER GASTROINTESTINAL ENDOSCOPY    . VAGINAL HYSTERECTOMY     ovaries not excised   Family History  Problem Relation Age of Onset  . Colon cancer Sister   . Colon cancer Sister   . Breast cancer Mother   . Lung disease Mother   . Emphysema Father   . Esophageal cancer Neg Hx   . Rectal cancer Neg Hx   . Stomach cancer Neg Hx    History  Sexual Activity  . Sexual activity: Not on file    Outpatient Encounter Prescriptions as of 07/13/2017  Medication Sig  . aspirin 81 MG tablet Take 81 mg by mouth daily.    Marland Kitchen atorvastatin (LIPITOR) 20 MG tablet TAKE 1 TABLET BY MOUTH  DAILY  . Calcium Carbonate (CALTRATE 600 PO) Take 1 tablet by mouth 2 (two) times daily.    . chlorpheniramine (CHLOR-TRIMETON) 4 MG tablet Take 4 mg by mouth daily as needed for allergies.  . Coenzyme Q10 (CO Q-10 PO)  Take 50 mg by mouth daily.    . fluticasone (FLONASE) 50 MCG/ACT nasal spray INSTILL 1 TO 2 SPRAYS IN EACH NOSTRIL ONCE OR TWICE DAILY  . levothyroxine (SYNTHROID, LEVOTHROID) 75 MCG tablet TAKE 1 TABLET BY MOUTH  DAILY BEFORE BREAKFAST  . meloxicam (MOBIC) 15 MG tablet Take 1 tablet (15 mg total) by mouth daily.  . metoprolol tartrate (LOPRESSOR) 25 MG tablet TAKE 1 TABLET BY MOUTH TWO  TIMES DAILY  . multivitamin (THERAGRAN) per tablet Take 1 tablet by mouth daily.    Marland Kitchen senna-docusate (SENOKOT-S) 8.6-50 MG per tablet Take 1 tablet by mouth daily.    Marland Kitchen zolpidem (AMBIEN) 10 MG tablet take 1 tablet by mouth at bedtime if needed  . Zoster Vac Recomb Adjuvanted [facility]) injection Inject 0.5 mLs into the muscle once.   No facility-administered encounter medications on file as of 07/13/2017.     Activities of Daily Living In your present state of health, do you have any difficulty performing the following activities: 07/13/2017  Hearing? N  Vision? N  Difficulty  concentrating or making decisions? N  Walking or climbing stairs? N  Dressing or bathing? N  Doing errands, shopping? N  Preparing Food and eating ? N  Using the Toilet? N  In the past six months, have you accidently leaked urine? Y  Comment urine leaks Dr. Darrol Angel  Do you have problems with loss of bowel control? N  Managing your Medications? N  Managing your Finances? N  Housekeeping or managing your Housekeeping? N  Some recent data might be hidden    Patient Care Team: [name], MD as PCP - General (Internal Medicine) [name], MD as Consulting Physician (Cardiology) Deneise Lever, MD as Consulting Physician (Pulmonary Disease) Irene Shipper, MD as Consulting Physician (Gastroenterology) Danella Sensing, MD as Consulting Physician (Dermatology) Loletta Specter, MD (Plastic Surgery) [name], MD (Obstetrics and Gynecology)    Assessment:    Physical assessment deferred to PCP.  Exercise Activities and Dietary recommendations Current Exercise Habits: Home exercise routine, Type of exercise: walking, Time (Minutes): 50, Frequency (Times/Week): 5, Weekly Exercise (Minutes/Week): 250, Intensity: Mild, Exercise limited by: None identified  Diet (meal preparation, eat out, water intake, caffeinated beverages, dairy products, fruits and vegetables): in general, a "healthy" diet  , well balanced,eats a variety of fruits and vegetables daily, limits salt, fat/cholesterol, sugar, caffeine, drinks 6-8 glasses of water daily.   Goals    . <enter goal here>    . maintian current health status          Be as active and as independent as possible. Enjoy life, family, and worship God.      Fall Risk Fall Risk  07/13/2017 09/01/2016 05/29/2015 05/30/2014  Falls in the past year? No Yes No No  Number falls in past yr: - 1 - -  Injury with Fall? - No - -  Risk for fall due to : Impaired balance/gait;Impaired mobility Other (Comment) - -  Risk for fall due to:  Comment - Pt was giving blood and passed out after.  - -   Depression Screen PHQ 2/9 Scores 07/13/2017 09/01/2016 05/29/2015 05/30/2014  PHQ - 2 Score 0 0 0 0  PHQ- 9 Score 0 - - -     Cognitive Function MMSE - Mini Mental State Exam 07/13/2017  Orientation to time 5  Orientation to Place 5  Registration 3  Attention/ Calculation 5  Recall 2  Language- name 2 objects 2  Language- repeat 1  Language- follow 3 step command 3  Language- read & follow direction 1  Write a sentence 1  Copy design 1  Total score 29        Immunization History  Administered Date(s) Administered  . H1N1 11/13/2008  . Influenza Split 08/10/2011, 08/28/2012  . Influenza Whole 08/05/2008, 08/18/2009, 08/05/2010  . Influenza, High Dose Seasonal PF 07/13/2017  . Influenza,inj,Quad PF,6+ Mos 08/28/2013, 12/11/2014, 07/30/2015, 07/21/2016  . Pneumococcal Conjugate-13 09/05/2013, 05/29/2015  . Pneumococcal-Unspecified 09/05/2013  . Tetanus 05/30/2014  . Zoster 10/06/2013   Screening Tests Health Maintenance  Topic Date Due  . INFLUENZA VACCINE  06/15/2017  . DEXA SCAN  04/05/2018  . TETANUS/TDAP  05/30/2024  . PNA vac Low Risk Adult  Completed      Plan:     Continue doing brain stimulating activities (puzzles, reading, adult coloring books, staying active) to keep memory sharp.   Continue to eat heart healthy diet (full of fruits, vegetables, whole grains, lean protein, water--limit salt, fat, and sugar intake) and increase physical activity as tolerated.  I have personally reviewed and noted the following in the patient's chart:   . Medical and social history . Use of alcohol, tobacco or illicit drugs  . Current medications and supplements . Functional ability and status . Nutritional status . Physical activity . Advanced directives . List of other physicians . Vitals . Screenings to include cognitive, depression, and falls . Referrals and appointments  In addition, I have reviewed  and discussed with patient certain preventive protocols, quality metrics, and best practice recommendations. A written personalized care plan for preventive services as well as general preventive health recommendations were provided to patient.     Michiel Cowboy, RN  07/13/2017   Medical screening examination/treatment/procedure(s) were performed by non-physician practitioner and as supervising physician I was immediately available for consultation/collaboration. I agree with above. [name], MD

## 2017-07-13 ENCOUNTER — Ambulatory Visit (INDEPENDENT_AMBULATORY_CARE_PROVIDER_SITE_OTHER): Payer: Medicare Other | Admitting: *Deleted

## 2017-07-13 ENCOUNTER — Other Ambulatory Visit (HOSPITAL_COMMUNITY): Payer: Medicare Other

## 2017-07-13 VITALS — BP 127/68 | HR 60 | Resp 20 | Ht 64.0 in | Wt 139.0 lb

## 2017-07-13 DIAGNOSIS — Z0000 Encounter for general adult medical examination without abnormal findings: Secondary | ICD-10-CM | POA: Diagnosis not present

## 2017-07-13 DIAGNOSIS — Z23 Encounter for immunization: Secondary | ICD-10-CM

## 2017-07-13 MED ORDER — ZOSTER VAC RECOMB ADJUVANTED 50 MCG/0.5ML IM SUSR: 0.5000 mL | Freq: Once | INTRAMUSCULAR | 1 refills | Status: AC

## 2017-07-13 NOTE — Patient Instructions (Addendum)
Continue doing brain stimulating activities (puzzles, reading, adult coloring books, staying active) to keep memory sharp.   Continue to eat heart healthy diet (full of fruits, vegetables, whole grains, lean protein, water--limit salt, fat, and sugar intake) and increase physical activity as tolerated.   [name] , Thank you for taking time to come for your Medicare Wellness Visit. I appreciate your ongoing commitment to your health goals. Please review the following plan we discussed and let me know if I can assist you in the future.   These are the goals we discussed: Goals    . <enter goal here>    . maintian current health status          Be as active and as independent as possible. Enjoy life, family, and worship God.       This is a list of the screening recommended for you and due dates:  Health Maintenance  Topic Date Due  . Flu Shot  06/15/2017  . DEXA scan (bone density measurement)  04/05/2018  . Tetanus Vaccine  05/30/2024  . Pneumonia vaccines  Completed   Influenza Virus Vaccine (Flucelvax) What is this medicine? INFLUENZA VIRUS VACCINE (in floo EN zuh VAHY ruhs vak SEEN) helps to reduce the risk of getting influenza also known as the flu. The vaccine only helps protect you against some strains of the flu. This medicine may be used for other purposes; ask your health care provider or pharmacist if you have questions. COMMON BRAND NAME(S): FLUCELVAX What should I tell my health care provider before I take this medicine? They need to know if you have any of these conditions: -bleeding disorder like hemophilia -fever or infection -Guillain-Barre syndrome or other neurological problems -immune system problems -infection with the human immunodeficiency virus (HIV) or AIDS -low blood platelet counts -multiple sclerosis -an unusual or allergic reaction to influenza virus vaccine, other medicines, foods, dyes or preservatives -pregnant or trying to get  pregnant -breast-feeding How should I use this medicine? This vaccine is for injection into a muscle. It is given by a health care professional. A copy of Vaccine Information Statements will be given before each vaccination. Read this sheet carefully each time. The sheet may change frequently. Talk to your pediatrician regarding the use of this medicine in children. Special care may be needed. Overdosage: If you think you've taken too much of this medicine contact a poison control center or emergency room at once. Overdosage: If you think you have taken too much of this medicine contact a poison control center or emergency room at once. NOTE: This medicine is only for you. Do not share this medicine with others. What if I miss a dose? This does not apply. What may interact with this medicine? -chemotherapy or radiation therapy -medicines that lower your immune system like etanercept, anakinra, infliximab, and adalimumab -medicines that treat or prevent blood clots like warfarin -phenytoin -steroid medicines like prednisone or cortisone -theophylline -vaccines This list may not describe all possible interactions. Give your health care provider a list of all the medicines, herbs, non-prescription drugs, or dietary supplements you use. Also tell them if you smoke, drink alcohol, or use illegal drugs. Some items may interact with your medicine. What should I watch for while using this medicine? Report any side effects that do not go away within 3 days to your doctor or health care professional. Call your health care provider if any unusual symptoms occur within 6 weeks of receiving this vaccine. You may still catch  the flu, but the illness is not usually as bad. You cannot get the flu from the vaccine. The vaccine will not protect against colds or other illnesses that may cause fever. The vaccine is needed every year. What side effects may I notice from receiving this medicine? Side effects that  you should report to your doctor or health care professional as soon as possible: -allergic reactions like skin rash, itching or hives, swelling of the face, lips, or tongue Side effects that usually do not require medical attention (Report these to your doctor or health care professional if they continue or are bothersome.): -fever -headache -muscle aches and pains -pain, tenderness, redness, or swelling at the injection site -tiredness This list may not describe all possible side effects. Call your doctor for medical advice about side effects. You may report side effects to FDA at 1-800-FDA-1088. Where should I keep my medicine? The vaccine will be given by a health care professional in a clinic, pharmacy, doctor's office, or other health care setting. You will not be given vaccine doses to store at home. NOTE: This sheet is a summary. It may not cover all possible information. If you have questions about this medicine, talk to your doctor, pharmacist, or health care provider.  2018 Elsevier/Gold Standard (2011-10-13 14:06:47)

## 2017-07-20 ENCOUNTER — Other Ambulatory Visit: Payer: Self-pay

## 2017-07-20 ENCOUNTER — Ambulatory Visit (HOSPITAL_COMMUNITY): Payer: Medicare Other | Attending: Cardiology

## 2017-07-20 DIAGNOSIS — E785 Hyperlipidemia, unspecified: Secondary | ICD-10-CM | POA: Insufficient documentation

## 2017-07-20 DIAGNOSIS — C50919 Malignant neoplasm of unspecified site of unspecified female breast: Secondary | ICD-10-CM | POA: Insufficient documentation

## 2017-07-20 DIAGNOSIS — I350 Nonrheumatic aortic (valve) stenosis: Secondary | ICD-10-CM | POA: Diagnosis not present

## 2017-07-20 DIAGNOSIS — R609 Edema, unspecified: Secondary | ICD-10-CM | POA: Insufficient documentation

## 2017-07-20 DIAGNOSIS — I6529 Occlusion and stenosis of unspecified carotid artery: Secondary | ICD-10-CM | POA: Diagnosis not present

## 2017-07-20 DIAGNOSIS — Z9889 Other specified postprocedural states: Secondary | ICD-10-CM | POA: Insufficient documentation

## 2017-07-20 DIAGNOSIS — I358 Other nonrheumatic aortic valve disorders: Secondary | ICD-10-CM | POA: Diagnosis not present

## 2017-07-20 DIAGNOSIS — I872 Venous insufficiency (chronic) (peripheral): Secondary | ICD-10-CM | POA: Insufficient documentation

## 2017-07-27 ENCOUNTER — Encounter: Payer: Self-pay | Admitting: Physician Assistant

## 2017-07-27 ENCOUNTER — Telehealth: Payer: Self-pay | Admitting: *Deleted

## 2017-07-27 ENCOUNTER — Ambulatory Visit (INDEPENDENT_AMBULATORY_CARE_PROVIDER_SITE_OTHER): Payer: Medicare Other | Admitting: Physician Assistant

## 2017-07-27 VITALS — BP 140/60 | HR 52 | Ht 65.0 in | Wt 140.0 lb

## 2017-07-27 DIAGNOSIS — R5383 Other fatigue: Secondary | ICD-10-CM | POA: Diagnosis not present

## 2017-07-27 DIAGNOSIS — I2510 Atherosclerotic heart disease of native coronary artery without angina pectoris: Secondary | ICD-10-CM

## 2017-07-27 DIAGNOSIS — E785 Hyperlipidemia, unspecified: Secondary | ICD-10-CM | POA: Diagnosis not present

## 2017-07-27 DIAGNOSIS — I10 Essential (primary) hypertension: Secondary | ICD-10-CM | POA: Insufficient documentation

## 2017-07-27 DIAGNOSIS — I350 Nonrheumatic aortic (valve) stenosis: Secondary | ICD-10-CM

## 2017-07-27 DIAGNOSIS — I519 Heart disease, unspecified: Secondary | ICD-10-CM

## 2017-07-27 DIAGNOSIS — R600 Localized edema: Secondary | ICD-10-CM | POA: Diagnosis not present

## 2017-07-27 LAB — BASIC METABOLIC PANEL
BUN / CREAT RATIO: 18 (ref 12–28)
BUN: 14 mg/dL (ref 8–27)
CO2: 24 mmol/L (ref 20–29)
CREATININE: 0.76 mg/dL (ref 0.57–1.00)
Calcium: 9.3 mg/dL (ref 8.7–10.3)
Chloride: 102 mmol/L (ref 96–106)
GFR calc Af Amer: 87 mL/min/{1.73_m2} (ref >59)
GFR, EST NON AFRICAN AMERICAN: 75 mL/min/{1.73_m2} (ref >59)
GLUCOSE: 85 mg/dL (ref 65–99)
POTASSIUM: 4.7 mmol/L (ref 3.5–5.2)
Sodium: 140 mmol/L (ref 134–144)

## 2017-07-27 LAB — CBC
Hematocrit: 38.5 % (ref 34.0–46.6)
Hemoglobin: 12.9 g/dL (ref 11.1–15.9)
MCH: 29.3 pg (ref 26.6–33.0)
MCHC: 33.5 g/dL (ref 31.5–35.7)
MCV: 88 fL (ref 79–97)
Platelets: 311 10*3/uL (ref 150–379)
RBC: 4.4 x10E6/uL (ref 3.77–5.28)
RDW: 15 % (ref 12.3–15.4)
WBC: 7.3 10*3/uL (ref 3.4–10.8)

## 2017-07-27 LAB — PRO B NATRIURETIC PEPTIDE: NT-PRO BNP: 692 pg/mL (ref 0–738)

## 2017-07-27 NOTE — Telephone Encounter (Signed)
Lmtcb to go over her lab results

## 2017-07-27 NOTE — Telephone Encounter (Signed)
-----   Message from Liliane Shi, [place] sent at 07/27/2017  4:50 PM EDT ----- Please call the patient Kidney function, hemoglobin, BNP normal. Continue with current treatment plan. Richardson Dopp, PA-C   07/27/2017 4:49 PM

## 2017-07-27 NOTE — Patient Instructions (Signed)
Medication Instructions:  1. Your physician recommends that you continue on your current medications as directed. Please refer to the Current Medication list given to you today.   Labwork: 1. TODAY BMET, CBC, PRO BNP  Testing/Procedures: 1. Your physician has requested that you have a lexiscan myoview. For further information please visit [email or web address]. Please follow instruction sheet, as given.    Follow-Up: Your physician wants you to follow-up in: 6 MONTHS WITH DR. Emelda Fear will receive a reminder letter in the mail two months in advance. If you don't receive a letter, please call our office to schedule the follow-up appointment.   Any Other Special Instructions Will Be Listed Below (If Applicable).     If you need a refill on your cardiac medications before your next appointment, please call your pharmacy.

## 2017-07-27 NOTE — Progress Notes (Signed)
Cardiology Office Note:    Date:  07/27/2017   ID:  [name], DOB [birth year], MRN [id number]  PCP:  [name], MD  Cardiologist:  Dr. [name]    Referring MD: [name], MD   Chief Complaint  Patient presents with  . Follow-up    CAD, aortic stenosis    History of Present Illness:    [name] is a 76 y.o. female with a hx of CAD status post non-STEMI followed by CABG in 2007, cardiac catheterization in 2011 demonstrating patent bypass grafts and nonischemic Myoview study in 3/16. Last seen by Dr. Burt Knack 3/18. Most recent echocardiogram 07/20/17 demonstrates mild to moderate aortic stenosis with a mean gradient of 17 mmHg.  [name] returns for follow-up. She's here alone. Since last seen, she has gained some weight. She walks 1-2 miles per day. She does note that she gets tired at times. She also notes some pain in her shoulders and upper back that only occurs if she eats prior to going for a walk. She denies shortness of breath, syncope, PND. She has chronic LE edema on the left without significant change.  Prior CV studies:   The following studies were reviewed today:  Echocardiogram 07/20/17  EF 55-60, Gr 2 DD, mild to moderate aortic stenosis (mean 17, peak 28) mildly enlarged ascending aorta (37 mm), trivial MR, mild LAE, PASP 36, trivial pericardial effusion  Echocardiogram 02/23/16 Mild LVH, EF 60-65, normal wall motion, grade 3 diastolic dysfunction, mild aortic stenosis (mean 14, peak 26), MAC, mild MR, moderate PI, PASP 37  Nuclear stress test 02/11/15 No ischemia, EF 81, normal study  Carotid US 3/16 Bilateral ICA 1-39-follow-up 2 years  Cardiac Catheterization 10/2010 EF 65 LM patent LAD proximal 95  LCx proximal 90, 80 RCA proximal 99 SVG-DX/LAD okay SVG-PDA/PLB okay  LIMA-OM2 okay  Past Medical History:  Diagnosis Date  . ALLERGIC RHINITIS   . Allergy    SEASONAL  . Anxiety   . Aortic stenosis, moderate   . Blood transfusion  without reported diagnosis   . BREAST CANCER 1991   left, 1991; B mastectomy  . Cataract    BILATERAL-REMOVED  . Coronary artery disease    s/p CABGx5 2007  . CORONARY ARTERY DISEASE   . Depression   . DIVERTICULOSIS   . DVT   . EROSIVE GASTRITIS   . GERD   . Heart murmur   . History of DVT (deep vein thrombosis)   . HYPERLIPIDEMIA   . Hypertension   . HYPOTHYROIDISM   . Occlusion and stenosis of carotid artery without mention of cerebral infarction   . Osteopenia   . OSTEOPOROSIS   . RHINOSINUSITIS, RECURRENT     Past Surgical History:  Procedure Laterality Date  . APPENDECTOMY    . COLONOSCOPY    . CORONARY ARTERY BYPASS GRAFT     5        2007  . MASTECTOMY     left with reconstruction and lymph node excision  1992  . MASTECTOMY     right with reconstruction  . REVISION RECONSTRUCTED BREAST Left 02/2014   willard (plastics in [place])  . right ankle arthroscopy    . TONSILLECTOMY AND ADENOIDECTOMY    . UPPER GASTROINTESTINAL ENDOSCOPY    . VAGINAL HYSTERECTOMY     ovaries not excised    Current Medications: Current Meds  Medication Sig  . aspirin 81 MG tablet Take 81 mg by mouth daily.    Marland Kitchen  atorvastatin (LIPITOR) 20 MG tablet TAKE 1 TABLET BY MOUTH  DAILY  . Calcium Carbonate (CALTRATE 600 PO) Take 1 tablet by mouth 2 (two) times daily.    . chlorpheniramine (CHLOR-TRIMETON) 4 MG tablet Take 4 mg by mouth daily as needed for allergies.  . Coenzyme Q10 (CO Q-10 PO) Take 50 mg by mouth daily.    . fluticasone (FLONASE) 50 MCG/ACT nasal spray INSTILL 1 TO 2 SPRAYS IN EACH NOSTRIL ONCE OR TWICE DAILY  . levothyroxine (SYNTHROID, LEVOTHROID) 75 MCG tablet TAKE 1 TABLET BY MOUTH  DAILY BEFORE BREAKFAST  . meloxicam (MOBIC) 15 MG tablet Take 1 tablet (15 mg total) by mouth daily.  . metoprolol tartrate (LOPRESSOR) 25 MG tablet TAKE 1 TABLET BY MOUTH TWO  TIMES DAILY  . multivitamin (THERAGRAN) per tablet Take 1 tablet by mouth daily.    Marland Kitchen senna-docusate (SENOKOT-S)  8.6-50 MG per tablet Take 1 tablet by mouth daily.    Marland Kitchen zolpidem (AMBIEN) 10 MG tablet take 1 tablet by mouth at bedtime if needed     Allergies:   Clarithromycin; Codeine; Levofloxacin; Morphine; Naproxen; Penicillins; and Pneumococcal vaccines   Social History   Social History  . Marital status: Divorced    Spouse name: N/A  . Number of children: N/A  . Years of education: N/A   Social History Main Topics  . Smoking status: Never Smoker  . Smokeless tobacco: Never Used  . Alcohol use No  . Drug use: No  . Sexual activity: Not Asked   Other Topics Concern  . 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 There is no history of Esophageal cancer, Rectal cancer, or Stomach cancer.  ROS:   Please see the history of present illness.    Review of Systems  Constitution: Positive for weight gain.  HENT: Positive for hearing loss.   Eyes: Positive for visual disturbance.  Cardiovascular: Positive for leg swelling.  Hematologic/Lymphatic: Bruises/bleeds easily.   All other systems reviewed and are negative.   EKGs/Labs/Other Test Reviewed:    EKG:  EKG is  ordered today.  The ekg ordered today demonstrates Sinus bradycardia, HR 52, normal axis, anterior Q waves, QTc 396 ms, no change from prior tracing  Recent Labs: 04/20/2017: ALT 20 05/04/2017: TSH 1.08 07/27/2017: BUN 14; Creatinine, Ser 0.76; Hemoglobin 12.9; NT-Pro BNP 692; Platelets 311; Potassium 4.7; Sodium 140   Recent Lipid Panel Lab Results  Component Value Date/Time   CHOL 125 04/20/2017 10:33 AM   TRIG 71 04/20/2017 10:33 AM   HDL 51 04/20/2017 10:33 AM   CHOLHDL 2.5 04/20/2017 10:33 AM   CHOLHDL 2.5 04/05/2016 10:59 AM   LDLCALC 60 04/20/2017 10:33 AM      Physical Exam:    VS:  BP 140/60   Pulse (!) 52   Ht _0  (1.651 m)   Wt 140 lb (63.5 kg)   SpO2 96%   BMI 23.30 kg/m     Wt Readings from Last 3 Encounters:  07/27/17 140 lb (63.5 kg)  07/13/17 139 lb (63 kg)  05/04/17 138 lb (62.6 kg)     Physical Exam  Constitutional: She is oriented to person, place, and time. She appears well-developed and well-nourished.  No distress.  HENT:  Head: Normocephalic and atraumatic.  Eyes: No scleral icterus.  Neck: No JVD present.  Cardiovascular: Normal rate and regular rhythm.   Murmur heard.  Crescendo-decrescendo systolic murmur is present with a grade of 3/6  at the upper right sternal border Pulmonary/Chest: Effort normal. She has no rales.  Abdominal: Soft. There is no tenderness.  Musculoskeletal: She exhibits edema (trace R LE edema).  Neurological: She is alert and oriented to person, place, and time.  Skin: Skin is warm and dry.  Psychiatric: She has a normal mood and affect.    ASSESSMENT:    1. Other fatigue   2. Coronary artery disease involving native coronary artery of native heart without angina pectoris   3. Aortic stenosis, moderate   4. Left ventricular diastolic dysfunction   5. Essential hypertension   6. Hyperlipidemia, unspecified hyperlipidemia type    PLAN:    In order of problems listed above:  1. Other fatigue  Etiology not entirely clear. She does have some shoulder pain that occurs if she eats prior to going for a walk. It does not occur if she does not eat prior to walking. She thinks that she may have had some shoulder pain prior to bypass. It has been a little over 2 years since her last stress test. Question if her symptoms are anginal equivalent. I will arrange a nuclear stress test. Also obtain CBC, BMET, BNP.  2. Coronary artery disease involving native coronary artery of native heart without angina pectoris As noted, I question if her shoulder pain is an anginal equivalent. Arrange  nuclear stress test. Continue aspirin, statin, beta blocker.  3. Aortic stenosis, moderate Moderate by recent echocardiogram. No red flag symptoms. Continue current therapy and plan on follow-up echocardiogram in 9/19.  4. Left ventricular diastolic dysfunction  She does not appear to be volume overloaded on exam. Question if her fatigue may be actually shortness of breath. She does have significant diastolic dysfunction on her echocardiogram. Obtain BNP. If her BNP is significantly elevated, consider adding Lasix.  5. Essential hypertension Blood pressure borderline elevated. Continue to monitor.  6. Hyperlipidemia, unspecified hyperlipidemia type Continue statin.   Dispo:  Return in about 6 months (around 01/24/2018) for Routine Follow Up, w/ Dr. Burt Knack.   Medication Adjustments/Labs and Tests Ordered: Current medicines are reviewed at length with the patient today.  Concerns regarding medicines are outlined above.  Tests Ordered: Orders Placed This Encounter  Procedures  . Basic Metabolic Panel (BMET)  . CBC  . Pro b natriuretic peptide  . Myocardial Perfusion Imaging  . EKG 12-Lead   Medication Changes: No orders of the defined types were placed in this encounter.   Signed, Richardson Dopp, PA-C  07/27/2017 5:46 PM    [facility] [address], [place], [place]  [address] Phone: [phone]; Fax: [phone]

## 2017-07-28 NOTE — Telephone Encounter (Signed)
Pt has been notified of lab results by phone with verbal understanding. Pt thanked me for my call today.   

## 2017-07-28 NOTE — Telephone Encounter (Signed)
-----   Message from Liliane Shi, [place] sent at 07/27/2017  4:50 PM EDT ----- Please call the patient Kidney function, hemoglobin, BNP normal. Continue with current treatment plan. Richardson Dopp, PA-C   07/27/2017 4:49 PM

## 2017-07-29 ENCOUNTER — Telehealth: Payer: Self-pay

## 2017-07-29 DIAGNOSIS — I350 Nonrheumatic aortic (valve) stenosis: Secondary | ICD-10-CM

## 2017-07-29 NOTE — Telephone Encounter (Signed)
Reviewed with SW 9/12.  Repeat echo ordered to be scheduled in 1 year.

## 2017-07-29 NOTE — Telephone Encounter (Signed)
-----   Message from [name], MD sent at 07/20/2017  9:44 PM EDT ----- Will review at upcoming office visit. Likely repeat echo next year.

## 2017-08-05 ENCOUNTER — Emergency Department (HOSPITAL_COMMUNITY): Payer: Medicare Other

## 2017-08-05 ENCOUNTER — Emergency Department (HOSPITAL_COMMUNITY)
Admission: EM | Admit: 2017-08-05 | Discharge: 2017-08-05 | Disposition: A | Discharge status: Home / Self Care | Payer: Medicare Other | Attending: Emergency Medicine | Admitting: Emergency Medicine

## 2017-08-05 ENCOUNTER — Telehealth: Payer: Self-pay | Admitting: Physician Assistant

## 2017-08-05 ENCOUNTER — Encounter (HOSPITAL_COMMUNITY): Payer: Medicare Other

## 2017-08-05 DIAGNOSIS — M25511 Pain in right shoulder: Secondary | ICD-10-CM | POA: Diagnosis not present

## 2017-08-05 DIAGNOSIS — M791 Myalgia: Secondary | ICD-10-CM | POA: Insufficient documentation

## 2017-08-05 DIAGNOSIS — I2510 Atherosclerotic heart disease of native coronary artery without angina pectoris: Secondary | ICD-10-CM | POA: Diagnosis not present

## 2017-08-05 DIAGNOSIS — S0083XA Contusion of other part of head, initial encounter: Secondary | ICD-10-CM | POA: Diagnosis not present

## 2017-08-05 DIAGNOSIS — Y92007 Garden or yard of unspecified non-institutional (private) residence as the place of occurrence of the external cause: Secondary | ICD-10-CM | POA: Diagnosis not present

## 2017-08-05 DIAGNOSIS — W0110XA Fall on same level from slipping, tripping and stumbling with subsequent striking against unspecified object, initial encounter: Secondary | ICD-10-CM | POA: Insufficient documentation

## 2017-08-05 DIAGNOSIS — R0789 Other chest pain: Secondary | ICD-10-CM | POA: Diagnosis not present

## 2017-08-05 DIAGNOSIS — S199XXA Unspecified injury of neck, initial encounter: Secondary | ICD-10-CM | POA: Diagnosis not present

## 2017-08-05 DIAGNOSIS — Z79899 Other long term (current) drug therapy: Secondary | ICD-10-CM | POA: Insufficient documentation

## 2017-08-05 DIAGNOSIS — S299XXA Unspecified injury of thorax, initial encounter: Secondary | ICD-10-CM | POA: Diagnosis not present

## 2017-08-05 DIAGNOSIS — Z7982 Long term (current) use of aspirin: Secondary | ICD-10-CM | POA: Diagnosis not present

## 2017-08-05 DIAGNOSIS — Y93H2 Activity, gardening and landscaping: Secondary | ICD-10-CM | POA: Insufficient documentation

## 2017-08-05 DIAGNOSIS — S098XXA Other specified injuries of head, initial encounter: Secondary | ICD-10-CM | POA: Diagnosis not present

## 2017-08-05 DIAGNOSIS — S4991XA Unspecified injury of right shoulder and upper arm, initial encounter: Secondary | ICD-10-CM | POA: Diagnosis not present

## 2017-08-05 DIAGNOSIS — S79911A Unspecified injury of right hip, initial encounter: Secondary | ICD-10-CM | POA: Diagnosis not present

## 2017-08-05 DIAGNOSIS — Y998 Other external cause status: Secondary | ICD-10-CM | POA: Diagnosis not present

## 2017-08-05 DIAGNOSIS — E785 Hyperlipidemia, unspecified: Secondary | ICD-10-CM | POA: Insufficient documentation

## 2017-08-05 DIAGNOSIS — W19XXXA Unspecified fall, initial encounter: Secondary | ICD-10-CM

## 2017-08-05 DIAGNOSIS — M7918 Myalgia, other site: Secondary | ICD-10-CM

## 2017-08-05 DIAGNOSIS — S0990XA Unspecified injury of head, initial encounter: Secondary | ICD-10-CM | POA: Insufficient documentation

## 2017-08-05 DIAGNOSIS — S20211A Contusion of right front wall of thorax, initial encounter: Secondary | ICD-10-CM | POA: Diagnosis not present

## 2017-08-05 DIAGNOSIS — Z86718 Personal history of other venous thrombosis and embolism: Secondary | ICD-10-CM | POA: Diagnosis not present

## 2017-08-05 DIAGNOSIS — I10 Essential (primary) hypertension: Secondary | ICD-10-CM | POA: Diagnosis not present

## 2017-08-05 DIAGNOSIS — S51811A Laceration without foreign body of right forearm, initial encounter: Secondary | ICD-10-CM | POA: Insufficient documentation

## 2017-08-05 DIAGNOSIS — S0181XA Laceration without foreign body of other part of head, initial encounter: Secondary | ICD-10-CM | POA: Diagnosis not present

## 2017-08-05 DIAGNOSIS — R9431 Abnormal electrocardiogram [ECG] [EKG]: Secondary | ICD-10-CM | POA: Diagnosis not present

## 2017-08-05 DIAGNOSIS — Z951 Presence of aortocoronary bypass graft: Secondary | ICD-10-CM | POA: Insufficient documentation

## 2017-08-05 DIAGNOSIS — S0993XA Unspecified injury of face, initial encounter: Secondary | ICD-10-CM | POA: Diagnosis not present

## 2017-08-05 MED ORDER — HYDROCODONE-ACETAMINOPHEN 5-325 MG PO TABS: 1.0000 | ORAL_TABLET | Freq: Four times a day (QID) | ORAL | 0 refills | Status: DC | PRN

## 2017-08-05 MED ORDER — ACETAMINOPHEN 325 MG PO TABS
650.0000 mg | ORAL_TABLET | Freq: Once | ORAL | Status: AC
  Administered 2017-08-05: 650 mg via ORAL
  Filled 2017-08-05: qty 2

## 2017-08-05 NOTE — ED Notes (Signed)
Pt fell over stump while in garden. Facial swelling right side, right arm skin tear mid-forearm, c/o pain ride side chest wall.

## 2017-08-05 NOTE — ED Notes (Signed)
Patient transported to X-ray 

## 2017-08-05 NOTE — ED Triage Notes (Signed)
Pt arrived via [facility] after tripping over a stump and falling while gardening. Per EMS, pt struck head on concrete, striking the right side of her head and face. Pt c/o associated site pain. Skin tear noted to right arm,bandaged by EMS. Pt did not bite tongue, teeth are intact. Per EMS, Friend of pt informed them the pt has droop to the left side of her mouth that was not noted prior to fall. Pt stated she feels "fuzzy" after fall. EMS found Pupils to be equal, round, and reactive. Pt was scheduled to have cardiac stress test today for existing valve condition. Pt argued against transport to ED but was persuaded by friends to be checked out. Pt is Alert and oriented x4. EMS VS 156/77, HR 57, RR 18, cbg 103.

## 2017-08-05 NOTE — Telephone Encounter (Signed)
Will route to Triage for further evaluation 

## 2017-08-05 NOTE — Discharge Instructions (Signed)
Alternate between Ibuprofen and Tylenol for pain. Get plenty of rest, use ice on your head and any of the areas of soreness.  Stay in a quiet, not simulating, dark environment until headaches improve. No TV, computer use, video games, or cell phone use until headache is resolved completely.  Use the incentive spirometer every hour or two while awake, in order to keep your lungs fully open and make sure you're taking full deep breaths, to avoid getting pneumonia.  Keep wound clean with mild soap and water. Keep area covered with a topical antibiotic ointment and bandage, keep bandage dry. Ice and elevate for additional pain and swelling relief. Follow up with your primary care doctor or the [facility] in approximately 3-5 days for wound recheck and recheck of symptoms. Monitor skin tear area for signs of infection to include, but not limited to: increasing pain, spreading redness, drainage/pus, worsening swelling, or fevers. Return to emergency department for emergent changing or worsening symptoms.

## 2017-08-05 NOTE — ED Notes (Signed)
Pt. Stated she was "light headed" while being escorted from the bathroom.

## 2017-08-05 NOTE — ED Notes (Signed)
PA in with patient at this time.

## 2017-08-05 NOTE — Telephone Encounter (Signed)
New message   Pt fell this morning and states she took a tumble and wants to know if she should still come in for the myocardial perfusion for [facility] today. Requests a call back

## 2017-08-05 NOTE — Telephone Encounter (Signed)
I advised nuclear dept pt will not be coming in for her Myoview today due to pt fell and is on her way to the ED via ambulance.  appt has been cancelled for Myoview.

## 2017-08-05 NOTE — Telephone Encounter (Signed)
[place] this morning on steps and said she hit her head. Patient said the ambulance is there at her house now. Patient c/o pain on the top of head and right arm. Patient sounded tearful on the phone. Patient wanted to know if she should still come for her stress test today. Patient advised that she should go to the ED for an evaluation and that she can call our office back and reschedule her stress test appt for today.

## 2017-08-05 NOTE — ED Provider Notes (Signed)
[facility] DEPT Provider Note   CSN: [id number] Arrival date & time: 08/05/17  1121     History   Chief Complaint Chief Complaint  Patient presents with  . Fall    HPI [name] is a 76 y.o. female with a PMHx of CAD, carotid artery stenosis s/p CABG x5 in 2007, aortic stenosis, remote breast CA s/p mastectomy, GERD, remote DVT, hypothyroidism, HTN, HLD, and other conditions listed below, who presents to the ED with complaints of mechanical fall around 10:30 AM approximately 1 hour prior to arrival. Patient states that she was moving some outdoor artwork back onto her lawn when she accidentally tripped over a tree stump, causing her to fall on her right side, hitting the cement steps with her right cheek, right shoulder, and right side of her body. She denies any prodromal lightheadedness, palpitations, or any other prodromal symptoms. Denies LOC. She is not on any blood thinners, takes a baby aspirin daily. She complains of neck pain, right rib pain, right shoulder pain, right cheek/head pain, bruising and swelling around the right cheek and eyelid, and a small skin tear on the right forearm. Bleeding has been controlled with bandage. She describes her right cheek pain as 5/10 intermittent pressure-like pain in the right cheek and temple, nonradiating, worse with palpation to the area, and with no treatments given or tried prior to arrival. She is compliant with all of her medications, and took her blood pressure medications this morning. She was actually on her way to have a Myoview stress test this morning before she fell, but her appointment is now canceled because of the fall and ED visit.   She denies any back pain, lightheadedness, dizziness, shortness of breath, palpitations, abdominal pain, nausea, vomiting, incontinence of urine or stool, saddle anesthesia or cauda equina symptoms, vision changes, numbness, tingling, focal weakness, or any other injuries or complaints. Denies any  recent fevers, chills, diarrhea, constipation, dysuria, hematuria, or any other preceding illness/complaints.  Last Tdap was 05/30/14.    The history is provided by the patient and medical records. No language interpreter was used.  Fall  This is a new problem. The current episode started less than 1 hour ago. The problem occurs rarely. The problem has not changed since onset.Associated symptoms include headaches. Pertinent negatives include no chest pain, no abdominal pain and no shortness of breath. Nothing aggravates the symptoms. Nothing relieves the symptoms. She has tried nothing for the symptoms. The treatment provided no relief.    Past Medical History:  Diagnosis Date  . ALLERGIC RHINITIS   . Allergy    SEASONAL  . Anxiety   . Aortic stenosis, moderate   . Blood transfusion without reported diagnosis   . BREAST CANCER 1991   left, 1991; B mastectomy  . Cataract    BILATERAL-REMOVED  . Coronary artery disease    s/p CABGx5 2007  . CORONARY ARTERY DISEASE   . Depression   . DIVERTICULOSIS   . DVT   . EROSIVE GASTRITIS   . GERD   . Heart murmur   . History of DVT (deep vein thrombosis)   . HYPERLIPIDEMIA   . Hypertension   . HYPOTHYROIDISM   . Occlusion and stenosis of carotid artery without mention of cerebral infarction   . Osteopenia   . OSTEOPOROSIS   . RHINOSINUSITIS, RECURRENT     Patient Active Problem List   Diagnosis Date Noted  . Left ventricular diastolic dysfunction 64/33/2951  . Essential hypertension 07/27/2017  .  Fall 03/02/2017  . Osteoarthritis 02/16/2017  . Urinary incontinence 02/16/2017  . Chest pain 09/01/2016  . Chronic venous insufficiency 08/20/2016  . Bilateral leg edema 03/05/2016  . Xiphoid pain 03/05/2016  . Insomnia 01/16/2016  . Anxiety and depression 12/24/2015  . Seasonal and perennial allergic rhinitis 11/24/2015  . Asthma, mild intermittent, well-controlled 11/24/2015  . Carotid artery disease ([place]) 06/29/2011  .  Hypothyroidism 11/11/2010  . Hyperlipidemia 11/11/2010  . Senile osteoporosis 11/11/2010  . RHINOSINUSITIS, RECURRENT 10/29/2008  . Personal history of malignant neoplasm of breast 03/27/2008  . GERD 03/27/2008  . DIVERTICULOSIS 03/27/2008  . CAD (coronary artery disease) 08/23/2007  . Aortic stenosis, moderate 08/23/2007  . DVT 08/23/2007    Past Surgical History:  Procedure Laterality Date  . APPENDECTOMY    . COLONOSCOPY    . CORONARY ARTERY BYPASS GRAFT     5        2007  . MASTECTOMY     left with reconstruction and lymph node excision  1992  . MASTECTOMY     right with reconstruction  . REVISION RECONSTRUCTED BREAST Left 02/2014   willard (plastics in [place])  . right ankle arthroscopy    . TONSILLECTOMY AND ADENOIDECTOMY    . UPPER GASTROINTESTINAL ENDOSCOPY    . VAGINAL HYSTERECTOMY     ovaries not excised    OB History    No data available       Home Medications    Prior to Admission medications   Medication Sig Start Date End Date Taking? Authorizing Provider  aspirin 81 MG tablet Take 81 mg by mouth daily.      [provider]  atorvastatin (LIPITOR) 20 MG tablet TAKE 1 TABLET BY MOUTH  DAILY 04/20/17   [name], MD  Calcium Carbonate (CALTRATE 600 PO) Take 1 tablet by mouth 2 (two) times daily.      [provider]  chlorpheniramine (CHLOR-TRIMETON) 4 MG tablet Take 4 mg by mouth daily as needed for allergies.    [provider]  Coenzyme Q10 (CO Q-10 PO) Take 50 mg by mouth daily.      [provider]  fluticasone (FLONASE) 50 MCG/ACT nasal spray INSTILL 1 TO 2 SPRAYS IN EACH NOSTRIL ONCE OR TWICE DAILY 12/16/16   Baird Lyons D, MD  levothyroxine (SYNTHROID, LEVOTHROID) 75 MCG tablet TAKE 1 TABLET BY MOUTH  DAILY BEFORE BREAKFAST 05/25/17   [name], MD  meloxicam (MOBIC) 15 MG tablet Take 1 tablet (15 mg total) by mouth daily. 02/16/17   [name], MD  metoprolol tartrate (LOPRESSOR) 25 MG tablet TAKE 1 TABLET  BY MOUTH TWO  TIMES DAILY 04/20/17   [name], MD  multivitamin [facility]) per tablet Take 1 tablet by mouth daily.      [provider]  senna-docusate (SENOKOT-S) 8.6-50 MG per tablet Take 1 tablet by mouth daily.      [provider]  zolpidem (AMBIEN) 10 MG tablet take 1 tablet by mouth at bedtime if needed 05/26/17   [name], MD    Family History Family History  Problem Relation Age of Onset  . Colon cancer Sister   . Colon cancer Sister   . Breast cancer Mother   . Lung disease Mother   . Emphysema Father   . Esophageal cancer Neg Hx   . Rectal cancer Neg Hx   . Stomach cancer Neg Hx     Social History Social History  Substance Use Topics  .  Smoking status: Never Smoker  . Smokeless tobacco: Never Used  . Alcohol use No     Allergies   Clarithromycin; Codeine; Levofloxacin; Morphine; Naproxen; Penicillins; and Pneumococcal vaccines   Review of Systems Review of Systems  Constitutional: Negative for chills and fever.  Eyes: Negative for visual disturbance.  Respiratory: Negative for shortness of breath.   Cardiovascular: Negative for chest pain and palpitations.  Gastrointestinal: Negative for abdominal pain, constipation, diarrhea, nausea and vomiting.  Genitourinary: Negative for difficulty urinating (no incontinence), dysuria and hematuria.  Musculoskeletal: Positive for arthralgias, myalgias and neck pain. Negative for back pain.  Skin: Positive for color change and wound.  Allergic/Immunologic: Negative for immunocompromised state.  Neurological: Positive for headaches. Negative for syncope, weakness, light-headedness and numbness.  Hematological: Does not bruise/bleed easily.  Psychiatric/Behavioral: Negative for confusion.   All other systems reviewed and are negative for acute change except as noted in the HPI.    Physical Exam Updated Vital Signs BP (!) 171/92   Pulse 60   Temp 98.6 F (37 C) (Oral)   Resp 17   SpO2  100%     Physical Exam  Constitutional: She is oriented to person, place, and time. Vital signs are normal. She appears well-developed and well-nourished.  Non-toxic appearance. No distress.  Afebrile, nontoxic, NAD, mildly hypertensive likely due to pain  HENT:  Head: Normocephalic. Head is with contusion. Head is without raccoon's eyes, without Battle's sign, without abrasion and without laceration.  Nose: Nose normal.  Mouth/Throat: Uvula is midline, oropharynx is clear and moist and mucous membranes are normal. No trismus in the jaw. No uvula swelling.  Small hematoma to R upper eyelid laterally, and to R cheek; mild TTP in these areas, no crepitus or deformity. No racoon eyes or battle's sign, no abrasions. No other scalp or facial tenderness, no skull depression. No malocclusion  Eyes: Pupils are equal, round, and reactive to light. Conjunctivae and EOM are normal. Right eye exhibits no discharge. Left eye exhibits no discharge.  PERRL, EOMI, no nystagmus, IOLs visible through pupils bilaterally  Neck: Normal range of motion. Neck supple. Spinous process tenderness and muscular tenderness present. No neck rigidity. Normal range of motion present.  Towel loosely wrapped around neck providing minimal support; C-spine with FROM intact with mild diffuse midline spinous process TTP and diffuse paraspinous muscle TTP, no bony stepoffs or deformities, mild muscle spasms in b/l trapezius muscles. No rigidity or meningeal signs. No bruising or swelling.   Cardiovascular: Normal rate, regular rhythm and intact distal pulses.  Exam reveals no gallop and no friction rub.   Murmur heard.  Systolic murmur is present  RRR, nl O8/C1, systolic murmur best heard at upper sternal borders consistent with aortic stenosis murmur, no rubs/gallops, distal pulses intact, no pedal edema   Pulmonary/Chest: Effort normal and breath sounds normal. No respiratory distress. She has no decreased breath sounds. She has no  wheezes. She has no rhonchi. She has no rales. She exhibits tenderness. She exhibits no crepitus, no deformity and no retraction.    CTAB in all lung fields, no w/r/r, no hypoxia or increased WOB, speaking in full sentences, SpO2 100% on RA Chest wall with mild R lateral side TTP without crepitus, deformities, or retractions, no bruising or abrasions  Abdominal: Soft. Normal appearance and bowel sounds are normal. She exhibits no distension. There is no tenderness. There is no rigidity, no rebound, no guarding, no CVA tenderness, no tenderness at McBurney's point and negative Murphy's sign.  Musculoskeletal:  Normal range of motion.  R shoulder with FROM intact, mild diffuse joint line TTP, no crepitus or deformity, no bruising or swelling.  Small skin tear to R forearm, no tenderness to elbow/wrist/forearm/hand, no crepitus or deformities. SEE PICTURE BELOW R hip with neg log roll testing, mild lateral joint line TTP, no crepitus or deformity, no swelling or bruising noted, no abnormal rotation or leg length discrepancy C-spine as above, all other spinal levels nonTTP without bony stepoffs or deformities Remainder of extremities without evidence of trauma or areas of tenderness MAE x4 Strength and sensation grossly intact in all extremities Distal pulses intact  Neurological: She is alert and oriented to person, place, and time. She has normal strength. No cranial nerve deficit or sensory deficit. Coordination normal. GCS eye subscore is 4. GCS verbal subscore is 5. GCS motor subscore is 6.  No focal neuro deficits on exam A&O x4 Coordination WNL  Skin: Skin is warm and dry. Abrasion and bruising noted. No rash noted.  R eyelid/cheek hematoma as mentioned above R forearm skin tear as mentioned above and pictured below  Psychiatric: She has a normal mood and affect.  Nursing note and vitals reviewed.      ED Treatments / Results  Labs (all labs ordered are listed, but only abnormal  results are displayed) Labs Reviewed - No data to display  EKG  EKG Interpretation  Date/Time:  Friday August 05 2017 11:36:53 EDT Ventricular Rate:  57 PR Interval:    QRS Duration: 84 QT Interval:  428 QTC Calculation: 417 R Axis:   29 Text Interpretation:  Sinus rhythm No significant change was found Confirmed by Jola Schmidt [phone]) on 08/05/2017 12:56:21 PM       Radiology Dg Ribs Unilateral W/chest Right  Result Date: 08/05/2017 CLINICAL DATA:  Fall.  Pain right chest wall. EXAM: RIGHT RIBS AND CHEST - 3+ VIEW COMPARISON:  03/02/2017.  CT 02/10/2017. FINDINGS: Mediastinum and hilar structures are normal. Prior CABG. Cardiomegaly with normal pulmonary vascularity. No focal infiltrate. No pleural effusion or pneumothorax. Thoracolumbar spine scoliosis and degenerative change. Surgical clips left axilla. IMPRESSION: No acute abnormality. Electronically Signed   By: Marcello Moores  Register   On: 08/05/2017 13:39   Dg Shoulder Right  Result Date: 08/05/2017 CLINICAL DATA:  Fall.  Right-sided pain. EXAM: RIGHT SHOULDER - 2+ VIEW COMPARISON:  CT 02/10/2017. FINDINGS: Acromioclavicular glenohumeral degenerative change. No evidence of fracture dislocation. Surgical clips noted over the right chest. Prior median sternotomy. IMPRESSION: No acute soft tissue bony abnormality. Electronically Signed   By: Marcello Moores  Register   On: 08/05/2017 13:43   Ct Head Wo Contrast  Result Date: 08/05/2017 CLINICAL DATA:  [place] on cement step, Hit head. Denies LOC. Pt has bruising to right forehead. EXAM: CT HEAD WITHOUT CONTRAST CT MAXILLOFACIAL WITHOUT CONTRAST CT CERVICAL SPINE WITHOUT CONTRAST TECHNIQUE: Multidetector CT imaging of the head, cervical spine, and maxillofacial structures were performed using the standard protocol without intravenous contrast. Multiplanar CT image reconstructions of the cervical spine and maxillofacial structures were also generated. COMPARISON:  None. FINDINGS: CT HEAD FINDINGS  Brain: Mild generalized age related parenchymal atrophy with commensurate dilatation of the ventricles and sulci. Mild chronic small vessel ischemic change within the deep periventricular white matter. No mass, hemorrhage, edema or other evidence of acute parenchymal abnormality. No extra-axial hemorrhage. Vascular: There are chronic calcified atherosclerotic changes of the large vessels at the skull base. No unexpected hyperdense vessel. Skull: Normal. Negative for fracture or focal lesion. Other: None. CT MAXILLOFACIAL  FINDINGS Osseous: Lower frontal bones are intact. No displaced nasal bone fracture. Osseous structures about the orbits appear intact and normally aligned bilaterally. Bilateral zygoma and pterygoid plates are intact. No mandible fracture or displacement seen. Orbits: Negative. No traumatic or inflammatory finding. Sinuses: Mild mucosal thickening within the maxillary sinuses and ethmoid air cells, likely chronic. Soft tissues: Mild soft tissue edema overlying the right zygoma/maxilla and right lower frontal bone. No underlying fracture. CT CERVICAL SPINE FINDINGS Alignment: Moderate levoscoliosis, possibly accentuated by patient positioning. No evidence of acute vertebral body subluxation. Skull base and vertebrae: No fracture line or displaced fracture fragment seen. Facet joints appear intact and normally aligned throughout. Soft tissues and spinal canal: No prevertebral fluid or swelling. No visible canal hematoma. Disc levels: Degenerative changes throughout the mid and lower cervical spine, mild to moderate in degree. Associated mild disc bulges at each level, resulting in mild to moderate central canal stenoses. Additional degenerative hypertrophy of the uncovertebral and facet joints causing moderate to severe right and moderate left neural foramen stenosis is C4-5, moderate to severe bilateral neural foramen stenoses at C5-6 and moderate bilateral neural foramen stenoses at C6-7. There may  be associated nerve root impingement at 1 or more levels. Upper chest: No acute findings. Biapical pleural-parenchymal scarring/fibrosis. Other: Carotid atherosclerosis. IMPRESSION: 1. Soft tissue edema overlying the right zygoma/maxilla and lower right frontal bone. No underlying fracture. 2. No facial bone fracture or displacement. 3. No acute intracranial abnormality. No intracranial mass, hemorrhage or edema. No skull fracture. 4. No fracture or acute subluxation within the cervical spine. Degenerative changes of the cervical spine, as detailed above. 5. Carotid atherosclerosis. Electronically Signed   By: Franki Cabot M.D.   On: 08/05/2017 14:02   Ct Cervical Spine Wo Contrast  Result Date: 08/05/2017 CLINICAL DATA:  [place] on cement step, Hit head. Denies LOC. Pt has bruising to right forehead. EXAM: CT HEAD WITHOUT CONTRAST CT MAXILLOFACIAL WITHOUT CONTRAST CT CERVICAL SPINE WITHOUT CONTRAST TECHNIQUE: Multidetector CT imaging of the head, cervical spine, and maxillofacial structures were performed using the standard protocol without intravenous contrast. Multiplanar CT image reconstructions of the cervical spine and maxillofacial structures were also generated. COMPARISON:  None. FINDINGS: CT HEAD FINDINGS Brain: Mild generalized age related parenchymal atrophy with commensurate dilatation of the ventricles and sulci. Mild chronic small vessel ischemic change within the deep periventricular white matter. No mass, hemorrhage, edema or other evidence of acute parenchymal abnormality. No extra-axial hemorrhage. Vascular: There are chronic calcified atherosclerotic changes of the large vessels at the skull base. No unexpected hyperdense vessel. Skull: Normal. Negative for fracture or focal lesion. Other: None. CT MAXILLOFACIAL FINDINGS Osseous: Lower frontal bones are intact. No displaced nasal bone fracture. Osseous structures about the orbits appear intact and normally aligned bilaterally. Bilateral zygoma  and pterygoid plates are intact. No mandible fracture or displacement seen. Orbits: Negative. No traumatic or inflammatory finding. Sinuses: Mild mucosal thickening within the maxillary sinuses and ethmoid air cells, likely chronic. Soft tissues: Mild soft tissue edema overlying the right zygoma/maxilla and right lower frontal bone. No underlying fracture. CT CERVICAL SPINE FINDINGS Alignment: Moderate levoscoliosis, possibly accentuated by patient positioning. No evidence of acute vertebral body subluxation. Skull base and vertebrae: No fracture line or displaced fracture fragment seen. Facet joints appear intact and normally aligned throughout. Soft tissues and spinal canal: No prevertebral fluid or swelling. No visible canal hematoma. Disc levels: Degenerative changes throughout the mid and lower cervical spine, mild to moderate in degree. Associated mild disc  bulges at each level, resulting in mild to moderate central canal stenoses. Additional degenerative hypertrophy of the uncovertebral and facet joints causing moderate to severe right and moderate left neural foramen stenosis is C4-5, moderate to severe bilateral neural foramen stenoses at C5-6 and moderate bilateral neural foramen stenoses at C6-7. There may be associated nerve root impingement at 1 or more levels. Upper chest: No acute findings. Biapical pleural-parenchymal scarring/fibrosis. Other: Carotid atherosclerosis. IMPRESSION: 1. Soft tissue edema overlying the right zygoma/maxilla and lower right frontal bone. No underlying fracture. 2. No facial bone fracture or displacement. 3. No acute intracranial abnormality. No intracranial mass, hemorrhage or edema. No skull fracture. 4. No fracture or acute subluxation within the cervical spine. Degenerative changes of the cervical spine, as detailed above. 5. Carotid atherosclerosis. Electronically Signed   By: Franki Cabot M.D.   On: 08/05/2017 14:02   Dg Hip Unilat W Or Wo Pelvis 2-3 Views  Right  Result Date: 08/05/2017 CLINICAL DATA:  Fall. EXAM: DG HIP (WITH OR WITHOUT PELVIS) 2-3V RIGHT COMPARISON:  No recent prior. FINDINGS: Degenerative changes lumbar spine and both hips. No evidence of fracture or dislocation. Diffuse osteopenia. Surgical clips right groin. Aortoiliac atherosclerotic vascular calcification . IMPRESSION: Degenerative changes lumbar spine and both hips. No acute abnormality identified. Electronically Signed   By: Marcello Moores  Register   On: 08/05/2017 13:36   Ct Maxillofacial Wo Contrast  Result Date: 08/05/2017 CLINICAL DATA:  [place] on cement step, Hit head. Denies LOC. Pt has bruising to right forehead. EXAM: CT HEAD WITHOUT CONTRAST CT MAXILLOFACIAL WITHOUT CONTRAST CT CERVICAL SPINE WITHOUT CONTRAST TECHNIQUE: Multidetector CT imaging of the head, cervical spine, and maxillofacial structures were performed using the standard protocol without intravenous contrast. Multiplanar CT image reconstructions of the cervical spine and maxillofacial structures were also generated. COMPARISON:  None. FINDINGS: CT HEAD FINDINGS Brain: Mild generalized age related parenchymal atrophy with commensurate dilatation of the ventricles and sulci. Mild chronic small vessel ischemic change within the deep periventricular white matter. No mass, hemorrhage, edema or other evidence of acute parenchymal abnormality. No extra-axial hemorrhage. Vascular: There are chronic calcified atherosclerotic changes of the large vessels at the skull base. No unexpected hyperdense vessel. Skull: Normal. Negative for fracture or focal lesion. Other: None. CT MAXILLOFACIAL FINDINGS Osseous: Lower frontal bones are intact. No displaced nasal bone fracture. Osseous structures about the orbits appear intact and normally aligned bilaterally. Bilateral zygoma and pterygoid plates are intact. No mandible fracture or displacement seen. Orbits: Negative. No traumatic or inflammatory finding. Sinuses: Mild mucosal thickening  within the maxillary sinuses and ethmoid air cells, likely chronic. Soft tissues: Mild soft tissue edema overlying the right zygoma/maxilla and right lower frontal bone. No underlying fracture. CT CERVICAL SPINE FINDINGS Alignment: Moderate levoscoliosis, possibly accentuated by patient positioning. No evidence of acute vertebral body subluxation. Skull base and vertebrae: No fracture line or displaced fracture fragment seen. Facet joints appear intact and normally aligned throughout. Soft tissues and spinal canal: No prevertebral fluid or swelling. No visible canal hematoma. Disc levels: Degenerative changes throughout the mid and lower cervical spine, mild to moderate in degree. Associated mild disc bulges at each level, resulting in mild to moderate central canal stenoses. Additional degenerative hypertrophy of the uncovertebral and facet joints causing moderate to severe right and moderate left neural foramen stenosis is C4-5, moderate to severe bilateral neural foramen stenoses at C5-6 and moderate bilateral neural foramen stenoses at C6-7. There may be associated nerve root impingement at 1 or more levels. Upper  chest: No acute findings. Biapical pleural-parenchymal scarring/fibrosis. Other: Carotid atherosclerosis. IMPRESSION: 1. Soft tissue edema overlying the right zygoma/maxilla and lower right frontal bone. No underlying fracture. 2. No facial bone fracture or displacement. 3. No acute intracranial abnormality. No intracranial mass, hemorrhage or edema. No skull fracture. 4. No fracture or acute subluxation within the cervical spine. Degenerative changes of the cervical spine, as detailed above. 5. Carotid atherosclerosis. Electronically Signed   By: Franki Cabot M.D.   On: 08/05/2017 14:02    Procedures Procedures (including critical care time)  Medications Ordered in ED Medications  acetaminophen (TYLENOL) tablet 650 mg (650 mg Oral Given 08/05/17 1229)     Initial Impression / Assessment  and Plan / ED Course  I have reviewed the triage vital signs and the nursing notes.  Pertinent labs & imaging results that were available during my care of the patient were reviewed by me and considered in my medical decision making (see chart for details).     76 y.o. female here with mechanical fall after tripping over tree stump in her yard, landed on R side onto cement steps, mostly on R cheek/temple area, but also has pain in R shoulder, neck, and R rib cage. Skin tear to R forearm but no tenderness there. Small bruise/hematoma to R upper eyelid area and cheek, no crepitus/deformity, mild tenderness. Mild c-spine tenderness diffusely. No other midline spinal TTP to remainder of spine. Mild R hip tenderness and R rib cage tenderness, no deformities or crepitus. NVI in all extremities, strength and sensation grossly intact. Doubt need for labs, will get CT head/face/neck, R rib xray, R shoulder xray, and R hip xray. Pt requesting tylenol. Of note, EKG done in triage, which is nonischemic with no acute findings; doubt need for labs. Will reassess shortly. Discussed case with my attending Dr. Venora Maples who agrees with plan.  2:24 PM CT head/face/neck with no acute findings. R rib xray negative. R shoulder xray negative. R hip xray negative. Pt ambulatory with steady gait. Advised head injury/concussion guidelines, discussed use of ice/heat to areas of pain, tylenol/motrin for pain, and proper wound care advised. Doubt need for ppx abx. Wound cleansed and dressed here. Incentive spirometer given to encourage full deep breaths to avoid PNA given rib contusionAdvised f/up with PCP in 3-5 days for recheck of symptoms and of wound. I explained the diagnosis and have given explicit precautions to return to the ER including for any other new or worsening symptoms. The patient understands and accepts the medical plan as it's been dictated and I have answered their questions. Discharge instructions concerning home  care and prescriptions have been given. The patient is STABLE and is discharged to home in good condition.    Final Clinical Impressions(s) / ED Diagnoses   Final diagnoses:  Fall, initial encounter  Contusion of face, initial encounter  Skin tear of right forearm without complication, initial encounter  Musculoskeletal pain  Rib contusion, right, initial encounter    New Prescriptions New Prescriptions   No medications on [address], [place], [place] 08/05/17 [place], MD 08/05/17 1430

## 2017-08-05 NOTE — ED Notes (Signed)
Pt discharged to family member. Pt aaox4, VS stable

## 2017-08-17 ENCOUNTER — Ambulatory Visit (INDEPENDENT_AMBULATORY_CARE_PROVIDER_SITE_OTHER): Payer: Medicare Other | Admitting: Internal Medicine

## 2017-08-17 ENCOUNTER — Encounter: Payer: Self-pay | Admitting: Internal Medicine

## 2017-08-17 DIAGNOSIS — S060X9A Concussion with loss of consciousness of unspecified duration, initial encounter: Secondary | ICD-10-CM | POA: Insufficient documentation

## 2017-08-17 DIAGNOSIS — I2510 Atherosclerotic heart disease of native coronary artery without angina pectoris: Secondary | ICD-10-CM

## 2017-08-17 DIAGNOSIS — S060X0D Concussion without loss of consciousness, subsequent encounter: Secondary | ICD-10-CM

## 2017-08-17 DIAGNOSIS — R0781 Pleurodynia: Secondary | ICD-10-CM | POA: Diagnosis not present

## 2017-08-17 DIAGNOSIS — S060XAA Concussion with loss of consciousness status unknown, initial encounter: Secondary | ICD-10-CM | POA: Insufficient documentation

## 2017-08-17 DIAGNOSIS — S51811S Laceration without foreign body of right forearm, sequela: Secondary | ICD-10-CM | POA: Diagnosis not present

## 2017-08-17 NOTE — Assessment & Plan Note (Signed)
No fractured ribs on xray Pain improving Continue tylenol as needed

## 2017-08-17 NOTE — Patient Instructions (Signed)
Take tylenol as needed.  Apply vaseline or aquaphor to the wound on your right forearm.  Call if your pain and wound do not continue to improve or with any questions.

## 2017-08-17 NOTE — Assessment & Plan Note (Signed)
Intermittent headaches - mild - relieved with tylenol No other concerning symptoms Discussed symptoms of a concussion Continue tylenol as needed  Decrease activities if headaches increase or she has other symptoms

## 2017-08-17 NOTE — Progress Notes (Signed)
Subjective:    Patient ID: [name], female    DOB: 1941/07/02, 76 y.o.   MRN: [id number]  HPI The patient is here for follow up from the ED.  She went to the ED 9/21 after a fall at home.  She fell in the morning at home when she tripped over a tree stump while moving something and fell on her right side.  She hit the cement steps with her right cheek, right shoulder and right side of her body.  She denies any prodromal symptoms including lightheadedness, palpitationsOr chest pain. There is no loss of consciousness. In the emergency room she complained of neck pain, right rib pain, right shoulder pain, right cheek/head pain and a skin tear on the right forearm. The pain in her right cheek and temple area was at an intensity of 5/10 intermittent pressure-like pain. EKG in the emergency room shows sinus rhythm with no significant changes. She received Tylenol. CT of the head/face/neck showed no acute findings. Right rib x-ray was negative for fracture. Right shoulder x-ray was negative for fracture or dislocation. Right forearm skin tear did not require stitches. She was ambulating. She was advised of an injury/concussion guidelines, advised ice/heat, Tylenol/Motrin for pain and wound care for her right forearm. She was discharged home.  Her right ribs are still sore.  Her right orbit is still sore.    She has had intermittent headaches.  Tylenol helps. She denies lightheadedness and dizziness.  She denies changes in her vision or nausea.    Her right forearm wound is not healing.  She denies discharge, but has some bleeding intermittently.  She put on an antibacterial ointment for a while and has not put on anything recently.    Medications and allergies reviewed with patient and updated if appropriate.  Patient Active Problem List   Diagnosis Date Noted  . Left ventricular diastolic dysfunction 42/59/5638  . Essential hypertension 07/27/2017  . Fall 03/02/2017  . Osteoarthritis  02/16/2017  . Urinary incontinence 02/16/2017  . Chest pain 09/01/2016  . Chronic venous insufficiency 08/20/2016  . Bilateral leg edema 03/05/2016  . Xiphoid pain 03/05/2016  . Insomnia 01/16/2016  . Anxiety and depression 12/24/2015  . Seasonal and perennial allergic rhinitis 11/24/2015  . Asthma, mild intermittent, well-controlled 11/24/2015  . Carotid artery disease ([place]) 06/29/2011  . Hypothyroidism 11/11/2010  . Hyperlipidemia 11/11/2010  . Senile osteoporosis 11/11/2010  . RHINOSINUSITIS, RECURRENT 10/29/2008  . Personal history of malignant neoplasm of breast 03/27/2008  . GERD 03/27/2008  . DIVERTICULOSIS 03/27/2008  . CAD (coronary artery disease) 08/23/2007  . Aortic stenosis, moderate 08/23/2007  . DVT 08/23/2007    Current Outpatient Prescriptions on File Prior to Visit  Medication Sig Dispense Refill  . aspirin 81 MG tablet Take 81 mg by mouth daily.      Marland Kitchen atorvastatin (LIPITOR) 20 MG tablet TAKE 1 TABLET BY MOUTH  DAILY 90 tablet 2  . Calcium Carbonate (CALTRATE 600 PO) Take 1 tablet by mouth 2 (two) times daily.      . chlorpheniramine (CHLOR-TRIMETON) 4 MG tablet Take 4 mg by mouth daily as needed for allergies.    . Coenzyme Q10 (CO Q-10 PO) Take 50 mg by mouth daily.      . fluticasone (FLONASE) 50 MCG/ACT nasal spray INSTILL 1 TO 2 SPRAYS IN EACH NOSTRIL ONCE OR TWICE DAILY 16 g 0  . HYDROcodone-acetaminophen (NORCO/VICODIN) 5-325 MG tablet Take 1 tablet by mouth every 6 (six) hours as needed  for moderate pain. 8 tablet 0  . levothyroxine (SYNTHROID, LEVOTHROID) 75 MCG tablet TAKE 1 TABLET BY MOUTH  DAILY BEFORE BREAKFAST 90 tablet 1  . meloxicam (MOBIC) 15 MG tablet Take 1 tablet (15 mg total) by mouth daily. 90 tablet 1  . metoprolol tartrate (LOPRESSOR) 25 MG tablet TAKE 1 TABLET BY MOUTH TWO  TIMES DAILY 180 tablet 2  . multivitamin (THERAGRAN) per tablet Take 1 tablet by mouth daily.      Marland Kitchen senna-docusate (SENOKOT-S) 8.6-50 MG per tablet Take 1 tablet by  mouth daily.      Marland Kitchen zolpidem (AMBIEN) 10 MG tablet take 1 tablet by mouth at bedtime if needed 30 tablet 0   No current facility-administered medications on file prior to visit.     Past Medical History:  Diagnosis Date  . ALLERGIC RHINITIS   . Allergy    SEASONAL  . Anxiety   . Aortic stenosis, moderate   . Blood transfusion without reported diagnosis   . BREAST CANCER 1991   left, 1991; B mastectomy  . Cataract    BILATERAL-REMOVED  . Coronary artery disease    s/p CABGx5 2007  . CORONARY ARTERY DISEASE   . Depression   . DIVERTICULOSIS   . DVT   . EROSIVE GASTRITIS   . GERD   . Heart murmur   . History of DVT (deep vein thrombosis)   . HYPERLIPIDEMIA   . Hypertension   . HYPOTHYROIDISM   . Occlusion and stenosis of carotid artery without mention of cerebral infarction   . Osteopenia   . OSTEOPOROSIS   . RHINOSINUSITIS, RECURRENT     Past Surgical History:  Procedure Laterality Date  . APPENDECTOMY    . COLONOSCOPY    . CORONARY ARTERY BYPASS GRAFT     5        2007  . MASTECTOMY     left with reconstruction and lymph node excision  1992  . MASTECTOMY     right with reconstruction  . REVISION RECONSTRUCTED BREAST Left 02/2014   willard (plastics in [place])  . right ankle arthroscopy    . TONSILLECTOMY AND ADENOIDECTOMY    . UPPER GASTROINTESTINAL ENDOSCOPY    . VAGINAL HYSTERECTOMY     ovaries not excised    Social History   Social History  . Marital status: Divorced    Spouse name: N/A  . Number of children: N/A  . Years of education: N/A   Social History Main Topics  . Smoking status: Never Smoker  . Smokeless tobacco: Never Used  . Alcohol use No  . Drug use: No  . Sexual activity: Not on file   Other Topics Concern  . 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 Colon cancer Sister   . Colon  cancer Sister   . Breast cancer Mother   . Lung disease Mother   . Emphysema Father   . Esophageal cancer Neg Hx   . Rectal cancer Neg Hx   . Stomach cancer Neg Hx     Review of Systems  Constitutional: Negative for chills and fever.  Eyes: Negative for visual disturbance.  Respiratory: Negative for cough, shortness of breath and wheezing.        Right sided rib pain  Cardiovascular: Negative for chest pain.  Gastrointestinal: Negative for nausea.  Neurological: Positive for headaches (intemrittent). Negative for dizziness and light-headedness.  Psychiatric/Behavioral: Negative for confusion.       Objective:   Vitals:   08/17/17 1311  BP: (!) 150/72  Pulse: 71  Resp: 16  Temp: 97.7 F (36.5 C)  SpO2: 98%   Wt Readings from Last 3 Encounters:  08/17/17 140 lb (63.5 kg)  07/27/17 140 lb (63.5 kg)  07/13/17 139 lb (63 kg)   Body mass index is 23.3 kg/m.   Physical Exam  Constitutional: She is oriented to person, place, and time. She appears well-developed and well-nourished. No distress.  HENT:  Head: Normocephalic and atraumatic.  Cardiovascular: Normal rate and regular rhythm.   Murmur (3/6 systolic) heard. Pulmonary/Chest: Effort normal and breath sounds normal. No respiratory distress. She has no wheezes. She has no rales. She exhibits tenderness (right lateral and posterior ribs).  Musculoskeletal: She exhibits no edema (wearing compression socks).  Neurological: She is alert and oriented to person, place, and time.  Skin: Skin is warm and dry. She is not diaphoretic.  Right forearm healing abrasion - no discharge or bleeding, mild erythema surrounding that appears to be normal healing, minimal pain  Psychiatric: She has a normal mood and affect. Her behavior is normal.           Assessment & Plan:    See Problem List for Assessment and Plan of chronic medical problems.

## 2017-08-17 NOTE — Assessment & Plan Note (Signed)
No evidence of infection Healing slowly Advised not to use H2O2 Apply vaseline or aquaphor Cover when out, ok to leave uncovered at home Call if no improvement

## 2017-08-29 DIAGNOSIS — L570 Actinic keratosis: Secondary | ICD-10-CM | POA: Diagnosis not present

## 2017-08-29 DIAGNOSIS — D2272 Melanocytic nevi of left lower limb, including hip: Secondary | ICD-10-CM | POA: Diagnosis not present

## 2017-08-29 DIAGNOSIS — D2271 Melanocytic nevi of right lower limb, including hip: Secondary | ICD-10-CM | POA: Diagnosis not present

## 2017-08-29 DIAGNOSIS — D1801 Hemangioma of skin and subcutaneous tissue: Secondary | ICD-10-CM | POA: Diagnosis not present

## 2017-08-29 DIAGNOSIS — Z85828 Personal history of other malignant neoplasm of skin: Secondary | ICD-10-CM | POA: Diagnosis not present

## 2017-08-29 DIAGNOSIS — L814 Other melanin hyperpigmentation: Secondary | ICD-10-CM | POA: Diagnosis not present

## 2017-08-29 DIAGNOSIS — L821 Other seborrheic keratosis: Secondary | ICD-10-CM | POA: Diagnosis not present

## 2017-08-30 ENCOUNTER — Telehealth (HOSPITAL_COMMUNITY): Payer: Self-pay | Admitting: *Deleted

## 2017-08-30 NOTE — Telephone Encounter (Signed)
Patient given detailed instructions per Myocardial Perfusion Study Information Sheet for the test on 09/02/17 at 1130. Patient notified to arrive 15 minutes early and that it is imperative to arrive on time for appointment to keep from having the test rescheduled.  If you need to cancel or reschedule your appointment, please call the office within 24 hours of your appointment. . Patient verbalized understanding.Shalia Bartko, Ranae Palms

## 2017-09-02 ENCOUNTER — Ambulatory Visit (HOSPITAL_COMMUNITY): Payer: Medicare Other | Attending: Cardiovascular Disease

## 2017-09-02 ENCOUNTER — Encounter: Payer: Self-pay | Admitting: Physician Assistant

## 2017-09-02 DIAGNOSIS — R5383 Other fatigue: Secondary | ICD-10-CM

## 2017-09-02 DIAGNOSIS — R110 Nausea: Secondary | ICD-10-CM

## 2017-09-02 DIAGNOSIS — I2510 Atherosclerotic heart disease of native coronary artery without angina pectoris: Secondary | ICD-10-CM | POA: Diagnosis not present

## 2017-09-02 LAB — MYOCARDIAL PERFUSION IMAGING
LHR: 0.25
LVDIAVOL: 66 mL (ref 46–106)
LVSYSVOL: 16 mL
Peak HR: 80 {beats}/min
Rest HR: 55 {beats}/min
SDS: 0
SRS: 6
SSS: 6
TID: 1.05

## 2017-09-02 MED ORDER — TECHNETIUM TC 99M TETROFOSMIN IV KIT
31.6000 | PACK | Freq: Once | INTRAVENOUS | Status: AC | PRN
  Administered 2017-09-02: 31.6 via INTRAVENOUS
  Filled 2017-09-02: qty 32

## 2017-09-02 MED ORDER — TECHNETIUM TC 99M TETROFOSMIN IV KIT
9.8000 | PACK | Freq: Once | INTRAVENOUS | Status: AC | PRN
  Administered 2017-09-02: 9.8 via INTRAVENOUS
  Filled 2017-09-02: qty 10

## 2017-09-02 MED ORDER — REGADENOSON 0.4 MG/5ML IV SOLN
0.4000 mg | Freq: Once | INTRAVENOUS | Status: AC
  Administered 2017-09-02: 0.4 mg via INTRAVENOUS

## 2017-09-02 MED ORDER — AMINOPHYLLINE 25 MG/ML IV SOLN
150.0000 mg | Freq: Once | INTRAVENOUS | Status: AC
  Administered 2017-09-02: 150 mg via INTRAVENOUS

## 2017-09-28 DIAGNOSIS — H5310 Unspecified subjective visual disturbances: Secondary | ICD-10-CM | POA: Diagnosis not present

## 2017-09-28 DIAGNOSIS — H524 Presbyopia: Secondary | ICD-10-CM | POA: Diagnosis not present

## 2017-09-28 DIAGNOSIS — H16213 Exposure keratoconjunctivitis, bilateral: Secondary | ICD-10-CM | POA: Diagnosis not present

## 2017-09-28 DIAGNOSIS — H1789 Other corneal scars and opacities: Secondary | ICD-10-CM | POA: Diagnosis not present

## 2017-09-29 ENCOUNTER — Other Ambulatory Visit: Payer: Self-pay | Admitting: Internal Medicine

## 2017-09-29 NOTE — Telephone Encounter (Signed)
[place] Controlled Substance Database checked. Last filled on 05/26/17

## 2017-10-03 MED ORDER — ZOLPIDEM TARTRATE 10 MG PO TABS: 10.0000 mg | ORAL_TABLET | Freq: Every day | ORAL | 0 refills | Status: DC

## 2017-10-03 NOTE — Telephone Encounter (Signed)
Spoke with pt, states that she can only use the [facility] and [facility] did not fill the medication correctly and used a [facility] representative. Pt would like to pick up a paper RX and take it to another pharmacy that uses that manufacture.

## 2017-10-03 NOTE — Telephone Encounter (Signed)
RX placed up front for pickup

## 2017-10-03 NOTE — Telephone Encounter (Signed)
Pt called regarding this she is having an issue with this Rx at the drug store, I did not understand her situation,  Please advise and call back

## 2017-10-03 NOTE — Addendum Note (Signed)
Addended by: Terence Lux B on: 10/03/2017 11:00 AM   Modules accepted: Orders

## 2017-11-10 ENCOUNTER — Ambulatory Visit: Payer: Self-pay

## 2017-11-10 NOTE — Telephone Encounter (Signed)
   Reason for Disposition . [1] Continuous (nonstop) coughing interferes with work or school AND [2] no improvement using cough treatment per protocol  Answer Assessment - Initial Assessment Questions 1. ONSET: "When did the cough begin?"      Started 11/08/17 2. SEVERITY: "How bad is the cough today?"      Moderate 3. RESPIRATORY DISTRESS: "Describe your breathing."      Some shortness of breath 4. FEVER: "Do you have a fever?" If so, ask: "What is your temperature, how was it measured, and when did it start?"     No 5. HEMOPTYSIS: "Are you coughing up any blood?" If so ask: "How much?" (flecks, streaks, tablespoons, etc.)     No 6. TREATMENT: "What have you done so far to treat the cough?" (e.g., meds, fluids, humidifier)     OTC - NOT HELPING 7. CARDIAC HISTORY: "Do you have any history of heart disease?" (e.g., heart attack, congestive heart failure)      CABG 10 years ago 8. LUNG HISTORY: "Do you have any history of lung disease?"  (e.g., pulmonary embolus, asthma, emphysema)     Asthma 9. PE RISK FACTORS: "Do you have a history of blood clots?" (or: recent major surgery, recent prolonged travel, bedridden )     No 10. OTHER SYMPTOMS: "Do you have any other symptoms? (e.g., runny nose, wheezing, chest pain)       Sinus pressure with clear drainage. 11. PREGNANCY: "Is there any chance you are pregnant?" "When was your last menstrual period?"       No 12. TRAVEL: "Have you traveled out of the country in the last month?" (e.g., travel history, exposures)       No  Protocols used: COUGH - ACUTE NON-PRODUCTIVE-A-AH  Pt. States she is afraid with her history of asthma she could have "bronchitis." States has lethargy as well. Will continue OTC and home remedies. Appointment made.

## 2017-11-11 ENCOUNTER — Ambulatory Visit (INDEPENDENT_AMBULATORY_CARE_PROVIDER_SITE_OTHER): Payer: Medicare Other | Admitting: Family

## 2017-11-11 ENCOUNTER — Encounter: Payer: Self-pay | Admitting: Family

## 2017-11-11 VITALS — BP 146/82 | HR 72 | Temp 97.9°F | Resp 16 | Ht 65.0 in | Wt 140.0 lb

## 2017-11-11 DIAGNOSIS — J019 Acute sinusitis, unspecified: Secondary | ICD-10-CM

## 2017-11-11 MED ORDER — PROMETHAZINE-DM 6.25-15 MG/5ML PO SYRP: 5.0000 mL | ORAL_SOLUTION | Freq: Four times a day (QID) | ORAL | 0 refills | Status: DC | PRN

## 2017-11-11 MED ORDER — FLUTICASONE PROPIONATE 50 MCG/ACT NA SUSP: 2.0000 | Freq: Every day | NASAL | 6 refills | Status: DC

## 2017-11-11 MED ORDER — DOXYCYCLINE HYCLATE 100 MG PO TABS: 100.0000 mg | ORAL_TABLET | Freq: Two times a day (BID) | ORAL | 0 refills | Status: DC

## 2017-11-11 NOTE — Progress Notes (Signed)
[name] is a 76 y.o. female with the following history as recorded in EpicCare:  Patient Active Problem List   Diagnosis Date Noted  . Rib pain on right side 08/17/2017  . Mild concussion 08/17/2017  . Laceration of skin of forearm, right, sequela 08/17/2017  . Left ventricular diastolic dysfunction 78/93/8101  . Essential hypertension 07/27/2017  . Fall 03/02/2017  . Osteoarthritis 02/16/2017  . Urinary incontinence 02/16/2017  . Chest pain 09/01/2016  . Chronic venous insufficiency 08/20/2016  . Bilateral leg edema 03/05/2016  . Xiphoid pain 03/05/2016  . Insomnia 01/16/2016  . Anxiety and depression 12/24/2015  . Seasonal and perennial allergic rhinitis 11/24/2015  . Asthma, mild intermittent, well-controlled 11/24/2015  . Carotid artery disease ([place]) 06/29/2011  . Hypothyroidism 11/11/2010  . Hyperlipidemia 11/11/2010  . Senile osteoporosis 11/11/2010  . RHINOSINUSITIS, RECURRENT 10/29/2008  . Personal history of malignant neoplasm of breast 03/27/2008  . GERD 03/27/2008  . DIVERTICULOSIS 03/27/2008  . CAD (coronary artery disease) 08/23/2007  . Aortic stenosis, moderate 08/23/2007  . DVT 08/23/2007    Current Outpatient Medications  Medication Sig Dispense Refill  . aspirin 81 MG tablet Take 81 mg by mouth daily.      Marland Kitchen atorvastatin (LIPITOR) 20 MG tablet TAKE 1 TABLET BY MOUTH  DAILY 90 tablet 2  . Calcium Carbonate (CALTRATE 600 PO) Take 1 tablet by mouth 2 (two) times daily.      . chlorpheniramine (CHLOR-TRIMETON) 4 MG tablet Take 4 mg by mouth daily as needed for allergies.    . Coenzyme Q10 (CO Q-10 PO) Take 50 mg by mouth daily.      . fluticasone (FLONASE) 50 MCG/ACT nasal spray INSTILL 1 TO 2 SPRAYS IN EACH NOSTRIL ONCE OR TWICE DAILY 16 g 0  . levothyroxine (SYNTHROID, LEVOTHROID) 75 MCG tablet TAKE 1 TABLET BY MOUTH  DAILY BEFORE BREAKFAST 90 tablet 1  . meloxicam (MOBIC) 15 MG tablet Take 1 tablet (15 mg total) by mouth daily. 90 tablet 1  .  metoprolol tartrate (LOPRESSOR) 25 MG tablet TAKE 1 TABLET BY MOUTH TWO  TIMES DAILY 180 tablet 2  . multivitamin (THERAGRAN) per tablet Take 1 tablet by mouth daily.      Marland Kitchen senna-docusate (SENOKOT-S) 8.6-50 MG per tablet Take 1 tablet by mouth daily.      Marland Kitchen zolpidem (AMBIEN) 10 MG tablet Take 1 tablet (10 mg total) at bedtime by mouth. 30 tablet 0  . doxycycline (VIBRA-TABS) 100 MG tablet Take 1 tablet (100 mg total) by mouth 2 (two) times daily. 20 tablet 0  . fluticasone (FLONASE) 50 MCG/ACT nasal spray Place 2 sprays into both nostrils daily. 16 g 6  . promethazine-dextromethorphan (PROMETHAZINE-DM) 6.25-15 MG/5ML syrup Take 5 mLs by mouth 4 (four) times daily as needed for cough. 118 mL 0   No current facility-administered medications for this visit.     Allergies: Clarithromycin; Codeine; Levofloxacin; Morphine; Naproxen; Penicillins; and Pneumococcal vaccines  Past Medical History:  Diagnosis Date  . ALLERGIC RHINITIS   . Allergy    SEASONAL  . Anxiety   . Aortic stenosis, moderate   . Blood transfusion without reported diagnosis   . BREAST CANCER 1991   left, 1991; B mastectomy  . Cataract    BILATERAL-REMOVED  . Coronary artery disease    s/p CABGx5 2007 //Nuclear stress test 10/18: EF 76, no ischemia or infarction, low risk  . Depression   . DIVERTICULOSIS   . DVT   . EROSIVE GASTRITIS   .  GERD   . Heart murmur   . History of DVT (deep vein thrombosis)   . HYPERLIPIDEMIA   . Hypertension   . HYPOTHYROIDISM   . Occlusion and stenosis of carotid artery without mention of cerebral infarction   . Osteopenia   . OSTEOPOROSIS   . RHINOSINUSITIS, RECURRENT     Past Surgical History:  Procedure Laterality Date  . APPENDECTOMY    . COLONOSCOPY    . CORONARY ARTERY BYPASS GRAFT     5        2007  . MASTECTOMY     left with reconstruction and lymph node excision  1992  . MASTECTOMY     right with reconstruction  . REVISION RECONSTRUCTED BREAST Left 02/2014   willard  (plastics in [place])  . right ankle arthroscopy    . TONSILLECTOMY AND ADENOIDECTOMY    . UPPER GASTROINTESTINAL ENDOSCOPY    . VAGINAL HYSTERECTOMY     ovaries not excised    Family History  Problem Relation Age of Onset  . Colon cancer Sister   . Colon cancer Sister   . Breast cancer Mother   . Lung disease Mother   . Emphysema Father   . Esophageal cancer Neg Hx   . Rectal cancer Neg Hx   . Stomach cancer Neg Hx     Social History   Tobacco Use  . Smoking status: Never Smoker  . Smokeless tobacco: Never Used  Substance Use Topics  . Alcohol use: No    Alcohol/week: 0.0 oz    Subjective:  Cough/ congestion x 5 days; prone to sinus infection/ bronchitis; history of immunotherapy- last injection x 1 year ago; + facial pressure/ + teeth pain; + sore throat; denies any chest pain or shortness of breath;   Objective:  Vitals:   11/11/17 1400  BP: (!) 146/82  Pulse: 72  Resp: 16  Temp: 97.9 F (36.6 C)  TempSrc: Oral  SpO2: 97%  Weight: 140 lb (63.5 kg)  Height: 5\' 5"  (1.651 m)    General: Well developed, well nourished, in no acute distress  Skin : Warm and dry.  Head: Normocephalic and atraumatic  Eyes: Sclera and conjunctiva clear; pupils round and reactive to light; extraocular movements intact  Ears: External normal; canals clear; tympanic membranes normal  Oropharynx: Pink, supple. No suspicious lesions  Neck: Supple without thyromegaly, adenopathy  Lungs: Respirations unlabored; clear to auscultation bilaterally without wheeze, rales, rhonchi  CVS exam: normal rate and regular rhythm.  Neurologic: Alert and oriented; speech intact; face symmetrical; moves all extremities well; CNII-XII intact without focal deficit   Assessment:  1. Acute sinusitis, recurrence not specified, unspecified location     Plan:  Rx for Doxycycline and Flonase; patient requests cough syrup but has codeine and morphine listed as allergies- trial of Promethazine DM; increase fluids,  rest and follow-up worse, no better.   No Follow-up on file.  No orders of the defined types were placed in this encounter.   Requested Prescriptions   Signed Prescriptions Disp Refills  . fluticasone (FLONASE) 50 MCG/ACT nasal spray 16 g 6    Sig: Place 2 sprays into both nostrils daily.  Marland Kitchen doxycycline (VIBRA-TABS) 100 MG tablet 20 tablet 0    Sig: Take 1 tablet (100 mg total) by mouth 2 (two) times daily.  . promethazine-dextromethorphan (PROMETHAZINE-DM) 6.25-15 MG/5ML syrup 118 mL 0    Sig: Take 5 mLs by mouth 4 (four) times daily as needed for cough.

## 2017-11-16 ENCOUNTER — Other Ambulatory Visit: Payer: Self-pay | Admitting: Internal Medicine

## 2017-12-05 ENCOUNTER — Other Ambulatory Visit: Payer: Self-pay | Admitting: Cardiovascular Disease

## 2017-12-21 DIAGNOSIS — Z961 Presence of intraocular lens: Secondary | ICD-10-CM | POA: Diagnosis not present

## 2017-12-21 DIAGNOSIS — H1859 Other hereditary corneal dystrophies: Secondary | ICD-10-CM | POA: Diagnosis not present

## 2017-12-21 DIAGNOSIS — H04123 Dry eye syndrome of bilateral lacrimal glands: Secondary | ICD-10-CM | POA: Diagnosis not present

## 2017-12-28 ENCOUNTER — Other Ambulatory Visit: Payer: Self-pay | Admitting: Internal Medicine

## 2017-12-29 NOTE — Telephone Encounter (Signed)
[place] Controlled Substance Database checked. Last filled on 10/03/18

## 2018-01-20 ENCOUNTER — Telehealth: Payer: Self-pay | Admitting: Internal Medicine

## 2018-01-20 MED ORDER — ZOSTER VAC RECOMB ADJUVANTED 50 MCG/0.5ML IM SUSR: 0.5000 mL | Freq: Once | INTRAMUSCULAR | 1 refills | Status: AC

## 2018-01-20 NOTE — Telephone Encounter (Signed)
Spoke with pt to inform RX was sent to [facility].

## 2018-01-20 NOTE — Telephone Encounter (Signed)
Copied from [place] [id number]. Topic: Quick Communication - See Telephone Encounter >> Jan 20, 2018  1:15 PM Aurelio Brash B wrote: CRM for notification. See Telephone encounter for:  Pt is requesting [name] send rx for 1rst shingles vaccine (she states its the new one)  to [place] for her  01/20/18.

## 2018-01-23 DIAGNOSIS — H04123 Dry eye syndrome of bilateral lacrimal glands: Secondary | ICD-10-CM | POA: Diagnosis not present

## 2018-01-23 DIAGNOSIS — H02209 Unspecified lagophthalmos unspecified eye, unspecified eyelid: Secondary | ICD-10-CM | POA: Diagnosis not present

## 2018-01-23 DIAGNOSIS — H1859 Other hereditary corneal dystrophies: Secondary | ICD-10-CM | POA: Diagnosis not present

## 2018-01-23 DIAGNOSIS — Z961 Presence of intraocular lens: Secondary | ICD-10-CM | POA: Diagnosis not present

## 2018-01-30 ENCOUNTER — Encounter: Payer: Self-pay | Admitting: Family Medicine

## 2018-01-30 ENCOUNTER — Telehealth: Payer: Self-pay | Admitting: Emergency Medicine

## 2018-01-30 ENCOUNTER — Ambulatory Visit (INDEPENDENT_AMBULATORY_CARE_PROVIDER_SITE_OTHER): Payer: Medicare Other | Admitting: Family Medicine

## 2018-01-30 VITALS — BP 142/90 | HR 60 | Temp 97.9°F | Wt 139.0 lb

## 2018-01-30 DIAGNOSIS — J018 Other acute sinusitis: Secondary | ICD-10-CM | POA: Diagnosis not present

## 2018-01-30 MED ORDER — CEFUROXIME AXETIL 500 MG PO TABS: 500.0000 mg | ORAL_TABLET | Freq: Two times a day (BID) | ORAL | 0 refills | Status: DC

## 2018-01-30 MED ORDER — PROMETHAZINE-DM 6.25-15 MG/5ML PO SYRP: 5.0000 mL | ORAL_SOLUTION | Freq: Four times a day (QID) | ORAL | 0 refills | Status: DC | PRN

## 2018-01-30 NOTE — Telephone Encounter (Signed)
Copied from [place]. Topic: General - Other >> Jan 27, 2018 12:47 PM Yvette Rack wrote: Reason for CRM: pt states that she has a sinus infection again and would like a cough syrup and antibiotic sent to the [facility] on [address]  >> Jan 30, 2018 11:28 AM Marin Olp L wrote: Patient is upset no one contacted her Friday to let her know if the medication would be sent to pharm.

## 2018-01-30 NOTE — Telephone Encounter (Signed)
Looks like she has an appt today at [facility].

## 2018-01-30 NOTE — Telephone Encounter (Signed)
Awaiting response from MD.  

## 2018-01-30 NOTE — Telephone Encounter (Signed)
Copied from [place]. Topic: General - Other >> Jan 27, 2018 12:47 PM [name] wrote: Reason for CRM: pt states that she has a sinus infection again and would like a cough syrup and antibiotic sent to the [facility] on [address]

## 2018-01-30 NOTE — Progress Notes (Signed)
   Subjective:    Patient ID: [name], female    DOB: [birth year], 77 y.o.   MRN: [id number]  HPI Here for one week of sinus pressure, PND, ST and a dry cough. No fever. She has taken 7 doses of Doxycycline with no benefit.    Review of Systems  Constitutional: Negative.   HENT: Positive for congestion, postnasal drip, sinus pressure, sinus pain and sore throat.   Eyes: Negative.   Respiratory: Positive for cough.        Objective:   Physical Exam  Constitutional: She appears well-developed and well-nourished.  HENT:  Right Ear: External ear normal.  Left Ear: External ear normal.  Nose: Nose normal.  Mouth/Throat: Oropharynx is clear and moist.  Eyes: Conjunctivae are normal.  Neck: No thyromegaly present.  Pulmonary/Chest: Effort normal and breath sounds normal. No respiratory distress. She has no wheezes. She has no rales. She exhibits no tenderness.  Lymphadenopathy:    She has no cervical adenopathy.          Assessment & Plan:  Sinusitis, treat with Ceftin.  Alysia Penna, MD

## 2018-02-06 ENCOUNTER — Encounter: Payer: Self-pay | Admitting: Cardiovascular Disease

## 2018-02-06 ENCOUNTER — Ambulatory Visit (INDEPENDENT_AMBULATORY_CARE_PROVIDER_SITE_OTHER): Payer: Medicare Other | Admitting: Cardiovascular Disease

## 2018-02-06 VITALS — BP 128/82 | HR 62 | Ht 65.0 in | Wt 139.0 lb

## 2018-02-06 DIAGNOSIS — I350 Nonrheumatic aortic (valve) stenosis: Secondary | ICD-10-CM | POA: Diagnosis not present

## 2018-02-06 DIAGNOSIS — I2510 Atherosclerotic heart disease of native coronary artery without angina pectoris: Secondary | ICD-10-CM

## 2018-02-06 DIAGNOSIS — I5032 Chronic diastolic (congestive) heart failure: Secondary | ICD-10-CM | POA: Diagnosis not present

## 2018-02-06 NOTE — Patient Instructions (Addendum)
Medication Instructions:  Your provider recommends that you continue on your current medications as directed. Please refer to the Current Medication list given to you today.    Labwork:  You have an appointment for fasting blood work on Friday, September 13th. You may come any time between 7:45AM and 4:45PM as long as you're fasting.  Testing/Procedures: Your provider has requested that you have an echocardiogram in 6 months, on the same day you see Dr. Burt Knack.. Echocardiography is a painless test that uses sound waves to create images of your heart. It provides your doctor with information about the size and shape of your heart and how well your heart's chambers and valves are working. This procedure takes approximately one hour. There are no restrictions for this procedure.    Follow-Up: Your provider wants you to follow-up in: 6 months with Dr. Burt Knack. You will receive a reminder letter in the mail two months in advance. If you don't receive a letter, please call our office to schedule the follow-up appointment.    Any Other Special Instructions Will Be Listed Below (If Applicable).     If you need a refill on your cardiac medications before your next appointment, please call your pharmacy.

## 2018-02-06 NOTE — Progress Notes (Signed)
Cardiology Office Note Date:  02/06/2018   ID:  [name], [name] 06/30/1941, MRN [id number]  PCP:  [name], MD  Cardiologist:  [name], MD    Chief Complaint  Patient presents with  . Shortness of Breath     History of Present Illness: [name] is a 77 y.o. female who presents for follow-up of coronary artery disease, aortic stenosis, and chronic diastolic heart failure.  The patient underwent multivessel CABG in 2007.  Cardiac catheterization in 2011 demonstrated patency of all of her bypass grafts.  She has had mild to moderate aortic stenosis by serial echo studies, most recently in September 2018 when her mean transvalvular gradient was 17 mmHg.  The patient is here alone today.  She reports stable symptoms of shortness of breath with activity.  She is able to function in a good level and is not limited in her daily routine, but does have some shortness of breath when she walks at a brisk pace or long distance.  No chest pain, chest pressure, orthopnea, PND, heart palpitations, lightheadedness, or syncope.   Past Medical History:  Diagnosis Date  . ALLERGIC RHINITIS   . Allergy    SEASONAL  . Anxiety   . Aortic stenosis, moderate   . Blood transfusion without reported diagnosis   . BREAST CANCER 1991   left, 1991; B mastectomy  . Cataract    BILATERAL-REMOVED  . Coronary artery disease    s/p CABGx5 2007 //Nuclear stress test 10/18: EF 76, no ischemia or infarction, low risk  . Depression   . DIVERTICULOSIS   . DVT   . EROSIVE GASTRITIS   . GERD   . Heart murmur   . History of DVT (deep vein thrombosis)   . HYPERLIPIDEMIA   . Hypertension   . HYPOTHYROIDISM   . Occlusion and stenosis of carotid artery without mention of cerebral infarction   . Osteopenia   . OSTEOPOROSIS   . RHINOSINUSITIS, RECURRENT     Past Surgical History:  Procedure Laterality Date  . APPENDECTOMY    . COLONOSCOPY    . CORONARY ARTERY BYPASS GRAFT     5        2007    . MASTECTOMY     left with reconstruction and lymph node excision  1992  . MASTECTOMY     right with reconstruction  . REVISION RECONSTRUCTED BREAST Left 02/2014   willard (plastics in [place])  . right ankle arthroscopy    . TONSILLECTOMY AND ADENOIDECTOMY    . UPPER GASTROINTESTINAL ENDOSCOPY    . VAGINAL HYSTERECTOMY     ovaries not excised    Current Outpatient Medications  Medication Sig Dispense Refill  . aspirin 81 MG tablet Take 81 mg by mouth daily.      Marland Kitchen atorvastatin (LIPITOR) 20 MG tablet TAKE 1 TABLET BY MOUTH  DAILY 90 tablet 1  . Calcium Carbonate (CALTRATE 600 PO) Take 1 tablet by mouth 2 (two) times daily.      . cefUROXime (CEFTIN) 500 MG tablet Take 1 tablet (500 mg total) by mouth 2 (two) times daily with a meal. 20 tablet 0  . chlorpheniramine (CHLOR-TRIMETON) 4 MG tablet Take 4 mg by mouth daily as needed for allergies.    . Coenzyme Q10 (CO Q-10 PO) Take 50 mg by mouth daily.      . fluticasone (FLONASE) 50 MCG/ACT nasal spray INSTILL 1 TO 2 SPRAYS IN EACH NOSTRIL ONCE OR TWICE DAILY 16  g 0  . levothyroxine (SYNTHROID, LEVOTHROID) 75 MCG tablet TAKE 1 TABLET BY MOUTH  DAILY BEFORE BREAKFAST 90 tablet 1  . meloxicam (MOBIC) 15 MG tablet Take 1 tablet (15 mg total) by mouth daily. 90 tablet 1  . metoprolol tartrate (LOPRESSOR) 25 MG tablet TAKE 1 TABLET BY MOUTH TWO  TIMES DAILY 180 tablet 1  . multivitamin (THERAGRAN) per tablet Take 1 tablet by mouth daily.      . promethazine-dextromethorphan (PROMETHAZINE-DM) 6.25-15 MG/5ML syrup Take 5 mLs by mouth 4 (four) times daily as needed for cough. 240 mL 0  . PROPYLENE GLYCOL OP Place 1 drop into both eyes 4 (four) times daily.    Marland Kitchen senna-docusate (SENOKOT-S) 8.6-50 MG per tablet Take 1 tablet by mouth daily.      Marland Kitchen zolpidem (AMBIEN) 10 MG tablet TAKE 1 TABLET AT BEDTIME 30 tablet 0   No current facility-administered medications for this visit.     Allergies:   Clarithromycin; Codeine; Levofloxacin; Morphine; Naproxen;  Penicillins; and Pneumococcal vaccines   Social History:  The patient  reports that she has never smoked. She has never used smokeless tobacco. She reports that she does not drink alcohol or use drugs.   Family History:  The patient's  family history includes Breast cancer in her mother; Colon cancer in her sister and sister; Emphysema in her father; Lung disease in her mother.    ROS:  Please see the history of present illness.  Otherwise, review of systems is positive for cough, headache, shortness of breath.  All other systems are reviewed and negative.    PHYSICAL EXAM: VS:  BP 128/82   Pulse 62   Ht 5\' 5"  (1.651 m)   Wt 139 lb (63 kg)   SpO2 97%   BMI 23.13 kg/m  , BMI Body mass index is 23.13 kg/m. GEN: Well nourished, well developed, in no acute distress  HEENT: normal  Neck: no JVD, no masses.  Cardiac: RRR with a 2/6 systolic murmur at the RUSB, no diastolic murmur                Respiratory:  clear to auscultation bilaterally, normal work of breathing GI: soft, nontender, nondistended, + BS MS: no deformity or atrophy  Ext: no pretibial edema, pedal pulses 2+= bilaterally Skin: warm and dry, no rash Neuro:  Strength and sensation are intact Psych: euthymic mood, full affect  EKG:  EKG is ordered today. The ekg ordered today shows normal sinus rhythm 62 bpm, cannot rule out age-indeterminate anteroseptal infarct, otherwise within normal limits.  Recent Labs: 04/20/2017: ALT 20 05/04/2017: TSH 1.08 07/27/2017: BUN 14; Creatinine, Ser 0.76; Hemoglobin 12.9; NT-Pro BNP 692; Platelets 311; Potassium 4.7; Sodium 140   Lipid Panel     Component Value Date/Time   CHOL 125 04/20/2017 1033   TRIG 71 04/20/2017 1033   HDL 51 04/20/2017 1033   CHOLHDL 2.5 04/20/2017 1033   CHOLHDL 2.5 04/05/2016 1059   VLDL 13 04/05/2016 1059   LDLCALC 60 04/20/2017 1033      Wt Readings from Last 3 Encounters:  02/06/18 139 lb (63 kg)  01/30/18 139 lb (63 kg)  11/11/17 140 lb (63.5  kg)     Cardiac Studies Reviewed: Myocardial Perfusion Scan 09-02-2017: Study Highlights     Nuclear stress EF: 76%.  The left ventricular ejection fraction is hyperdynamic (>65%).  There was no ST segment deviation noted during stress.  The study is normal.  This is a low risk study.  Normal stress nuclear study with no ischemia or infarction; EF 76 with normal wall motion.   Echocardiogram 07-20-2017: Left ventricle:  The cavity size was normal. Wall thickness was normal. Systolic function was normal. The estimated ejection fraction was in the range of 55% to 60%. Although no diagnostic regional wall motion abnormality was identified, this possibility cannot be completely excluded on the basis of this study. Features are consistent with a pseudonormal left ventricular filling pattern, with concomitant abnormal relaxation and increased filling pressure (grade 2 diastolic dysfunction).  ------------------------------------------------------------------- Aortic valve:   Trileaflet; moderately calcified leaflets. Doppler:   There was mild to moderate stenosis.   There was no regurgitation.    VTI ratio of LVOT to aortic valve: 0.4. Valve area (VTI): 1.38 cm^2. Indexed valve area (VTI): 0.81 cm^2/m^2. Peak velocity ratio of LVOT to aortic valve: 0.36. Valve area (Vmax): 1.23 cm^2. Indexed valve area (Vmax): 0.73 cm^2/m^2. Mean velocity ratio of LVOT to aortic valve: 0.36. Valve area (Vmean): 1.24 cm^2. Indexed valve area (Vmean): 0.73 cm^2/m^2.    Mean gradient (S): 17 mm Hg. Peak gradient (S): 28 mm Hg.  ------------------------------------------------------------------- Aorta:  Aortic root: The aortic root was normal in size.  ------------------------------------------------------------------- Mitral valve:   Mildly calcified leaflets .  Doppler:   There was no evidence for stenosis.   There was trivial regurgitation. Peak gradient (D): 6 mm  Hg.  ------------------------------------------------------------------- Left atrium:  The atrium was mildly dilated.  ------------------------------------------------------------------- Right ventricle:  The cavity size was normal. Systolic function was normal.  ------------------------------------------------------------------- Pulmonic valve:    Structurally normal valve.   Cusp separation was normal.  Doppler:  Transvalvular velocity was within the normal range. There was trivial regurgitation.  ------------------------------------------------------------------- Tricuspid valve:   Doppler:  There was trivial regurgitation.   ------------------------------------------------------------------- Right atrium:  The atrium was normal in size.  ------------------------------------------------------------------- Pericardium:  A trivial pericardial effusion was identified.   ASSESSMENT AND PLAN: 1.  Coronary artery disease, native vessel, without angina: The patient is stable on her current medical regimen.  Her recent nuclear scan is reviewed demonstrating no ischemia.  She continues on aspirin for antiplatelet therapy, a beta-blocker, and a statin drug.  2.  Aortic stenosis, mild to moderate: Continue with surveillance echo and clinical follow-up.  I will see her back in 6 months with an echocardiogram.  Cardinal symptoms of aortic stenosis are reviewed with the patient today.  3.  Hypertension: Blood pressure is well controlled on her current regimen.  4.  Hyperlipidemia: Most recent lipids demonstrating a cholesterol of 125, HDL 51, LDL 60.  5.  Chronic diastolic heart failure, New York Heart Association functional class II symptoms: Patient has a degree of diastolic dysfunction.  Her BNP was elevated last year when checked.  She is not currently requiring diuretics.  She does not appear volume overloaded on exam.  Continue with risk reduction measures, sodium avoidance, and  good blood pressure control.  Current medicines are reviewed with the patient today.  The patient does not have concerns regarding medicines.  Labs/ tests ordered today include:  No orders of the defined types were placed in this encounter.   Disposition:   FU 6 months  Signed, [name], MD  02/06/2018 3:02 PM    [facility] [address], [place], [place]  [address] Phone: [phone]; Fax: [phone]

## 2018-02-08 ENCOUNTER — Ambulatory Visit (INDEPENDENT_AMBULATORY_CARE_PROVIDER_SITE_OTHER): Payer: Medicare Other | Admitting: Obstetrics & Gynecology

## 2018-02-08 ENCOUNTER — Encounter: Payer: Self-pay | Admitting: Obstetrics & Gynecology

## 2018-02-08 VITALS — BP 116/70 | Ht 63.75 in | Wt 140.0 lb

## 2018-02-08 DIAGNOSIS — Z90710 Acquired absence of both cervix and uterus: Secondary | ICD-10-CM

## 2018-02-08 DIAGNOSIS — Z1272 Encounter for screening for malignant neoplasm of vagina: Secondary | ICD-10-CM

## 2018-02-08 DIAGNOSIS — Z01411 Encounter for gynecological examination (general) (routine) with abnormal findings: Secondary | ICD-10-CM | POA: Diagnosis not present

## 2018-02-08 DIAGNOSIS — Z780 Asymptomatic menopausal state: Secondary | ICD-10-CM

## 2018-02-08 DIAGNOSIS — M810 Age-related osteoporosis without current pathological fracture: Secondary | ICD-10-CM

## 2018-02-08 DIAGNOSIS — Z853 Personal history of malignant neoplasm of breast: Secondary | ICD-10-CM

## 2018-02-08 NOTE — Progress Notes (Signed)
[name] 17-Apr-1941 [id number]   History:    77 y.o. G2P2L2 Married.  Has Grand-children.  RP:  Established patient presenting for annual gyn exam   HPI: S/P Total Hysterectomy.  No pelvic pain.  Normal vaginal secretions.  Not sexually active.  H/O Left Breast Ca with positive LNs in 1992. S/P Bilateral Mastectomy with reconstruction.  Small nodule on the left chest wall, excised by Dr Dalbert Batman 04/2017, patho benign, fat necrosis with fibrosis.  Urine and bowel movements normal.  Body mass index 24.22.  Physically active.  Health labs with family physician.  Past medical history,surgical history, family history and social history were all reviewed and documented in the EPIC chart.  Gynecologic History No LMP recorded. Patient has had a hysterectomy. Contraception: status post hysterectomy Last Pap: March 2017. Results were: Negative Last mammogram: Status post bilateral mastectomy with reconstruction.  No mammogram since 2013. Bone Density: Osteoporosis at the left arm per bone density on May 2018 Colonoscopy: May 2018  Obstetric History OB History  Gravida Para Term Preterm AB Living  2 2       2   SAB TAB Ectopic Multiple Live Births               # Outcome Date GA Lbr Len/2nd Weight Sex Delivery Anes PTL Lv  2 Para           1 Para              ROS: A ROS was performed and pertinent positives and negatives are included in the history.  GENERAL: No fevers or chills. HEENT: No change in vision, no earache, sore throat or sinus congestion. NECK: No pain or stiffness. CARDIOVASCULAR: No chest pain or pressure. No palpitations. PULMONARY: No shortness of breath, cough or wheeze. GASTROINTESTINAL: No abdominal pain, nausea, vomiting or diarrhea, melena or bright red blood per rectum. GENITOURINARY: No urinary frequency, urgency, hesitancy or dysuria. MUSCULOSKELETAL: No joint or muscle pain, no back pain, no recent trauma. DERMATOLOGIC: No rash, no itching, no lesions. ENDOCRINE:  No polyuria, polydipsia, no heat or cold intolerance. No recent change in weight. HEMATOLOGICAL: No anemia or easy bruising or bleeding. NEUROLOGIC: No headache, seizures, numbness, tingling or weakness. PSYCHIATRIC: No depression, no loss of interest in normal activity or change in sleep pattern.     Exam:   BP 116/70   Ht 5' 3.75" (1.619 m)   Wt 140 lb (63.5 kg)   BMI 24.22 kg/m   Body mass index is 24.22 kg/m.  General appearance : Well developed well nourished female. No acute distress HEENT: Eyes: no retinal hemorrhage or exudates,  Neck supple, trachea midline, no carotid bruits, no thyroidmegaly Lungs: Clear to auscultation, no rhonchi or wheezes, or rib retractions  Heart: Regular rate and rhythm, no murmurs or gallops Breast:Examined in sitting and supine position were symmetrical in appearance, no palpable masses or tenderness,  no skin retraction, no nipple inversion, no nipple discharge, no skin discoloration, no axillary or supraclavicular lymphadenopathy Abdomen: no palpable masses or tenderness, no rebound or guarding Extremities: no edema or skin discoloration or tenderness  Pelvic: Vulva: Normal             Vagina: No gross lesions or discharge.  Pap reflex done.  Cervix/Uterus Absent  Adnexa  Without masses or tenderness  Anus: Normal   Assessment/Plan:  77 y.o. female for annual exam   1. Encounter for gynecological examination with abnormal finding Gynecologic exam status post total hysterectomy.  Pap reflex done.  Breast exam normal status post bilateral mastectomy with reconstruction.  Colonoscopy May 2018.  Health labs with family physician.  2. History of total hysterectomy  3. Menopause present Well on no hormone replacement therapy.  4. Personal history of malignant neoplasm of left breast Status post bilateral mastectomy with reconstruction.  5. Age-related osteoporosis without current pathological fracture Vitamin D supplements, calcium rich  nutrition and regular weightbearing physical activity recommended.  Given the results showing osteoporosis a year ago without any medical treatment since then, decision made to repeat the bone density at 1 year May 2019 here.  Will have patient follow-up after to discuss results and start on therapy if still in the osteoporotic range. - DG Bone Density; Future  Counseling on above issues more than 50% for 10 minutes.  [name] MD, 11:12 AM 02/08/2018 r

## 2018-02-08 NOTE — Patient Instructions (Signed)
1. Encounter for gynecological examination with abnormal finding Gynecologic exam status post total hysterectomy.  Pap reflex done.  Breast exam normal status post bilateral mastectomy with reconstruction.  Colonoscopy May 2018.  Health labs with family physician.  2. History of total hysterectomy  3. Menopause present Well on no hormone replacement therapy.  4. Personal history of malignant neoplasm of left breast Status post bilateral mastectomy with reconstruction.  5. Age-related osteoporosis without current pathological fracture Vitamin D supplements, calcium rich nutrition and regular weightbearing physical activity recommended.  Given the results showing osteoporosis a year ago without any medical treatment since then, decision made to repeat the bone density at 1 year May 2019 here.  Will have patient follow-up after to discuss results and start on therapy if still in the osteoporotic range. - DG Bone Density; Future  [name], it was a pleasure seeing you today!  I will inform you of your results as soon as they are available.   Osteoporosis Osteoporosis is the thinning and loss of density in the bones. Osteoporosis makes the bones more brittle, fragile, and likely to break (fracture). Over time, osteoporosis can cause the bones to become so weak that they fracture after a simple fall. The bones most likely to fracture are the bones in the hip, wrist, and spine. What are the causes? The exact cause is not known. What increases the risk? Anyone can develop osteoporosis. You may be at greater risk if you have a family history of the condition or have poor nutrition. You may also have a higher risk if you are:  Female.  72 years old or older.  A smoker.  Not physically active.  White or Asian.  Slender.  What are the signs or symptoms? A fracture might be the first sign of the disease, especially if it results from a fall or injury that would not usually cause a bone to  break. Other signs and symptoms include:  Low back and neck pain.  Stooped posture.  Height loss.  How is this diagnosed? To make a diagnosis, your health care provider may:  Take a medical history.  Perform a physical exam.  Order tests, such as: ? A bone mineral density test. ? A dual-energy X-ray absorptiometry test.  How is this treated? The goal of osteoporosis treatment is to strengthen your bones to reduce your risk of a fracture. Treatment may involve:  Making lifestyle changes, such as: ? Eating a diet rich in calcium. ? Doing weight-bearing and muscle-strengthening exercises. ? Stopping tobacco use. ? Limiting alcohol intake.  Taking medicine to slow the process of bone loss or to increase bone density.  Monitoring your levels of calcium and vitamin D.  Follow these instructions at home:  Include calcium and vitamin D in your diet. Calcium is important for bone health, and vitamin D helps the body absorb calcium.  Perform weight-bearing and muscle-strengthening exercises as directed by your health care provider.  Do not use any tobacco products, including cigarettes, chewing tobacco, and electronic cigarettes. If you need help quitting, ask your health care provider.  Limit your alcohol intake.  Take medicines only as directed by your health care provider.  Keep all follow-up visits as directed by your health care provider. This is important.  Take precautions at home to lower your risk of falling, such as: ? Keeping rooms well lit and clutter free. ? Installing safety rails on stairs. ? Using rubber mats in the bathroom and other areas that are often wet  or slippery. Get help right away if: You fall or injure yourself. This information is not intended to replace advice given to you by your health care provider. Make sure you discuss any questions you have with your health care provider. Document Released: 08/11/2005 Document Revised: 04/05/2016 Document  Reviewed: 04/11/2014 Elsevier Interactive Patient Education  [employer].

## 2018-02-09 LAB — PAP IG W/ RFLX HPV ASCU

## 2018-02-09 NOTE — Addendum Note (Signed)
Addended by: Alen Blew on: 02/09/2018 04:21 PM   Modules accepted: Level of Service

## 2018-03-20 ENCOUNTER — Other Ambulatory Visit: Payer: Self-pay | Admitting: Internal Medicine

## 2018-03-20 ENCOUNTER — Telehealth: Payer: Self-pay | Admitting: *Deleted

## 2018-03-20 NOTE — Telephone Encounter (Signed)
Patient was told to schedule bone density in May 2019, pt said she is very worried about the billing part as medicare only pays every 2 years. I did tell patient Dr.Lavoie would code with correct diagnosis and it should not be an issue, but I can't promise. I'm not sure what else to tell patient. Anything else you can recommend?

## 2018-03-21 NOTE — Telephone Encounter (Signed)
[place] Controlled Substance Database checked. Last filled on 12/29/17

## 2018-03-22 NOTE — Telephone Encounter (Signed)
I looked into Medicare guidelines for coverage of a Bone Mass Measurement.  For a "qualified individual" Medicare covers this test every 2 years - Estrogen deficient, vertebral abnormalities, receiving glucocorticoid therapy for 3 months, Dx of primary Hyperparathyroidism and being monitored to assess response to FDA approved osteoporosis medication. Medicare may cover the BMM in more frequent screenings when medically necessary examples include but not limited to - long term use of glucocorticoids greater than 3 months or confirming baseline BMM's to permit monitoring. No other examples given for more frequent sceenings to be covered.   I also read information on coverage provided by [employer] that states Medicare will not reimburse BMM tests performed by a second provider when the test has been performed within the defined coverage period unless it is unsuccessful to obtaining the records. (which isnt our case).   The patient is not high risk based on documentation, is not on medication and doesn't fall under examples given for more frequent screenings. I did see on most recent BMM that [name] has a history of a fracture.  I left pt a message to advise her of the above and to obtain her fracture information. KW CMA

## 2018-03-27 DIAGNOSIS — L821 Other seborrheic keratosis: Secondary | ICD-10-CM | POA: Diagnosis not present

## 2018-03-27 DIAGNOSIS — Z85828 Personal history of other malignant neoplasm of skin: Secondary | ICD-10-CM | POA: Diagnosis not present

## 2018-03-29 NOTE — Telephone Encounter (Signed)
Pt didn't return call.  She left a message in the billing VM stating we could disregard the question. KW CMA

## 2018-05-10 ENCOUNTER — Ambulatory Visit (INDEPENDENT_AMBULATORY_CARE_PROVIDER_SITE_OTHER): Payer: Medicare Other | Admitting: Internal Medicine

## 2018-05-10 ENCOUNTER — Encounter: Payer: Self-pay | Admitting: Internal Medicine

## 2018-05-10 VITALS — BP 134/66 | HR 62 | Resp 16 | Wt 139.0 lb

## 2018-05-10 DIAGNOSIS — I10 Essential (primary) hypertension: Secondary | ICD-10-CM | POA: Diagnosis not present

## 2018-05-10 DIAGNOSIS — I2510 Atherosclerotic heart disease of native coronary artery without angina pectoris: Secondary | ICD-10-CM | POA: Diagnosis not present

## 2018-05-10 DIAGNOSIS — E785 Hyperlipidemia, unspecified: Secondary | ICD-10-CM | POA: Diagnosis not present

## 2018-05-10 DIAGNOSIS — R2689 Other abnormalities of gait and mobility: Secondary | ICD-10-CM

## 2018-05-10 DIAGNOSIS — G4700 Insomnia, unspecified: Secondary | ICD-10-CM | POA: Diagnosis not present

## 2018-05-10 DIAGNOSIS — E039 Hypothyroidism, unspecified: Secondary | ICD-10-CM | POA: Diagnosis not present

## 2018-05-10 DIAGNOSIS — W19XXXS Unspecified fall, sequela: Secondary | ICD-10-CM

## 2018-05-10 MED ORDER — ZOLPIDEM TARTRATE 10 MG PO TABS: 10.0000 mg | ORAL_TABLET | Freq: Every day | ORAL | 0 refills | Status: DC

## 2018-05-10 NOTE — Assessment & Plan Note (Signed)
Clinically euthyroid Check tsh  Titrate med dose if needed  

## 2018-05-10 NOTE — Progress Notes (Signed)
Subjective:    Patient ID: [name], female    DOB: July 13, 1941, 77 y.o.   MRN: [id number]  HPI The patient is here for follow up.  Falling:  She is falling a lot.  One time it occurs after turning-she lost her balance and fell backwards.  Another time she turned to get into the car and lost her balance and fell.  Sometimes she feels her balance is not good.  She has difficulty getting up from low toilets.  She is exercising regularly-walking.  Hyperlipidemia: She is taking her medication daily. She is fairly compliant with a low fat/cholesterol diet. She is exercising regularly. She denies myalgias.   Hypertension: She is taking her medication daily. She is compliant with a low sodium diet.  She denies chest pain, palpitations, edema, shortness of breath and regular headaches. She is exercising regularly.     Hypothyroidism:  She is taking her medication daily.  She denies any recent changes in energy or weight that are unexplained.   Insomnia:  She takes Ambien nightly.  She only takes a quarter of a pill at night, but does find that helps.  She denies any side effects.  She has been on the medication for years.  Medications and allergies reviewed with patient and updated if appropriate.  Patient Active Problem List   Diagnosis Date Noted  . Poor balance 05/10/2018  . Left ventricular diastolic dysfunction 14/48/1856  . Essential hypertension 07/27/2017  . Fall 03/02/2017  . Osteoarthritis 02/16/2017  . Urinary incontinence 02/16/2017  . Chest pain 09/01/2016  . Chronic venous insufficiency 08/20/2016  . Bilateral leg edema 03/05/2016  . Xiphoid pain 03/05/2016  . Insomnia 01/16/2016  . Anxiety and depression 12/24/2015  . Seasonal and perennial allergic rhinitis 11/24/2015  . Asthma, mild intermittent, well-controlled 11/24/2015  . Carotid artery disease ([place]) 06/29/2011  . Hypothyroidism 11/11/2010  . Hyperlipidemia 11/11/2010  . Senile osteoporosis 11/11/2010  .  RHINOSINUSITIS, RECURRENT 10/29/2008  . Personal history of malignant neoplasm of breast 03/27/2008  . GERD 03/27/2008  . DIVERTICULOSIS 03/27/2008  . CAD (coronary artery disease) 08/23/2007  . Aortic stenosis, moderate 08/23/2007  . DVT 08/23/2007    Current Outpatient Medications on File Prior to Visit  Medication Sig Dispense Refill  . aspirin 81 MG tablet Take 81 mg by mouth daily.      Marland Kitchen atorvastatin (LIPITOR) 20 MG tablet TAKE 1 TABLET BY MOUTH  DAILY 90 tablet 1  . Calcium Carbonate (CALTRATE 600 PO) Take 1 tablet by mouth 2 (two) times daily.      . chlorpheniramine (CHLOR-TRIMETON) 4 MG tablet Take 4 mg by mouth daily as needed for allergies.    . Coenzyme Q10 (CO Q-10 PO) Take 50 mg by mouth daily.      . fluticasone (FLONASE) 50 MCG/ACT nasal spray INSTILL 1 TO 2 SPRAYS IN EACH NOSTRIL ONCE OR TWICE DAILY 16 g 0  . levothyroxine (SYNTHROID, LEVOTHROID) 75 MCG tablet Take 1 tablet (75 mcg total) by mouth daily before breakfast. -- Office visit needed for further refills 90 tablet 0  . meloxicam (MOBIC) 15 MG tablet Take 1 tablet (15 mg total) by mouth daily. 90 tablet 1  . metoprolol tartrate (LOPRESSOR) 25 MG tablet TAKE 1 TABLET BY MOUTH TWO  TIMES DAILY 180 tablet 1  . multivitamin (THERAGRAN) per tablet Take 1 tablet by mouth daily.      Marland Kitchen PROPYLENE GLYCOL OP Place 1 drop into both eyes 4 (four) times daily.    Marland Kitchen  senna-docusate (SENOKOT-S) 8.6-50 MG per tablet Take 1 tablet by mouth daily.      Marland Kitchen zolpidem (AMBIEN) 10 MG tablet Take 1 tablet (10 mg total) by mouth at bedtime. -- Office visit needed for further refills 30 tablet 0   No current facility-administered medications on file prior to visit.     Past Medical History:  Diagnosis Date  . ALLERGIC RHINITIS   . Allergy    SEASONAL  . Anxiety   . Aortic stenosis, moderate   . Blood transfusion without reported diagnosis   . BREAST CANCER 1991   left, 1991; B mastectomy  . Cataract    BILATERAL-REMOVED  .  Coronary artery disease    s/p CABGx5 2007 //Nuclear stress test 10/18: EF 76, no ischemia or infarction, low risk  . Depression   . DIVERTICULOSIS   . DVT   . EROSIVE GASTRITIS   . GERD   . Heart murmur   . History of DVT (deep vein thrombosis)   . HYPERLIPIDEMIA   . Hypertension   . HYPOTHYROIDISM   . Occlusion and stenosis of carotid artery without mention of cerebral infarction   . Osteopenia   . OSTEOPOROSIS   . RHINOSINUSITIS, RECURRENT     Past Surgical History:  Procedure Laterality Date  . APPENDECTOMY    . COLONOSCOPY    . CORONARY ARTERY BYPASS GRAFT     5        2007  . MASTECTOMY     left with reconstruction and lymph node excision  1992  . MASTECTOMY     right with reconstruction  . REVISION RECONSTRUCTED BREAST Left 02/2014   [facility]ard (plastics in [place])  . right ankle arthroscopy    . TONSILLECTOMY AND ADENOIDECTOMY    . UPPER GASTROINTESTINAL ENDOSCOPY    . VAGINAL HYSTERECTOMY     ovaries not excised    Social History   Socioeconomic History  . Marital status: Divorced    Spouse name: Not on file  . Number of children: Not on file  . Years of education: Not on file  . Highest education level: Not on file  Occupational History  . Not on file  Social Needs  . Financial resource strain: Not on file  . Food insecurity:    Worry: Not on file    Inability: Not on file  . Transportation needs:    Medical: Not on file    Non-medical: Not on file  Tobacco Use  . Smoking status: Never Smoker  . Smokeless tobacco: Never Used  Substance and Sexual Activity  . Alcohol use: No    Alcohol/week: 0.0 oz  . Drug use: No  . Sexual activity: Not Currently  Lifestyle  . Physical activity:    Days per week: Not on file    Minutes per session: Not on file  . Stress: Not on file  Relationships  . Social connections:    Talks on phone: Not on file    Gets together: Not on file    Attends religious service: Not on file    Active member of club or  organization: Not on file    Attends meetings of clubs or organizations: Not on file    Relationship status: Not on file  Other Topics Concern  . 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 Colon cancer Sister   . Colon cancer Sister   . Breast cancer Mother   . Lung disease Mother   . Emphysema Father   . Esophageal cancer Neg Hx   . Rectal cancer Neg Hx   . Stomach cancer Neg Hx     Review of Systems  Constitutional: Negative for chills and fever.  Respiratory: Negative for cough, shortness of breath and wheezing.   Cardiovascular: Negative for chest pain, palpitations and leg swelling.  Neurological: Negative for light-headedness and headaches.       Objective:   Vitals:   05/10/18 1547  BP: 134/66  Pulse: 62  Resp: 16  SpO2: 95%   BP Readings from Last 3 Encounters:  05/10/18 134/66  02/08/18 116/70  02/06/18 128/82   Wt Readings from Last 3 Encounters:  05/10/18 139 lb (63 kg)  02/08/18 140 lb (63.5 kg)  02/06/18 139 lb (63 kg)   Body mass index is 24.05 kg/m.   Physical Exam    Constitutional: Appears well-developed and well-nourished. No distress.  HENT:  Head: Normocephalic and atraumatic.  Neck: Neck supple. No tracheal deviation present. No thyromegaly present.  No cervical lymphadenopathy Cardiovascular: Normal rate, regular rhythm and normal heart sounds.   No murmur heard. No carotid bruit .  No edema Pulmonary/Chest: Effort normal and breath sounds normal. No respiratory distress. No has no wheezes. No rales.  Skin: Skin is warm and dry. Not diaphoretic.  Psychiatric: Normal mood and affect. Behavior is normal.      Assessment & Plan:    See Problem List for Assessment and Plan of chronic medical problems.

## 2018-05-10 NOTE — Assessment & Plan Note (Signed)
Balance is not good, especially when making a quick movement or turning Has had a few falls Will refer for physical therapy

## 2018-05-10 NOTE — Patient Instructions (Addendum)
  Test(s) ordered today. Your results will be released to [place] (or called to you) after review, usually within 72hours after test completion. If any changes need to be made, you will be notified at that same time.  Medications reviewed and updated.  CNo changes recommended at this time.  Your prescription(s) have been submitted to your pharmacy. Please take as directed and contact our office if you believe you are having problem(s) with the medication(s).  A referral was ordered for physical therapy  Please followup in 6 months

## 2018-05-10 NOTE — Assessment & Plan Note (Signed)
She is taking a low dose Ambien nightly No side effects  Has been on it for years Will continue - refilled today

## 2018-05-10 NOTE — Assessment & Plan Note (Signed)
Has had several falls, especially when turning quickly Fall precautions Will refer to physical therapy

## 2018-05-10 NOTE — Assessment & Plan Note (Signed)
Check lipid panel  Continue daily statin Regular exercise and healthy diet encouraged  

## 2018-05-10 NOTE — Assessment & Plan Note (Signed)
BP well controlled Current regimen effective and well tolerated Continue current medications at current doses cmp  

## 2018-05-23 ENCOUNTER — Other Ambulatory Visit: Payer: Self-pay | Admitting: Internal Medicine

## 2018-05-23 DIAGNOSIS — M1990 Unspecified osteoarthritis, unspecified site: Secondary | ICD-10-CM

## 2018-05-23 NOTE — Telephone Encounter (Signed)
Copied from [place] [id number]. Topic: Quick Communication - Rx Refill/Question >> May 23, 2018  1:34 PM Bea Graff, NT wrote: Medication: meloxicam (MOBIC) 15 MG tablet   Has the patient contacted their pharmacy? Yes.   (Agent: If no, request that the patient contact the pharmacy for the refill.) (Agent: If yes, when and what did the pharmacy advise?)  Preferred Pharmacy (with phone number or street name): [id number] - [place], [address] [place]. [address] [place] [phone] (Phone) [phone] (Fax)      Agent: Please be advised that RX refills may take up to 3 business days. We ask that you follow-up with your pharmacy.

## 2018-05-23 NOTE — Telephone Encounter (Signed)
Meloxicam refill Last Refill:02/16/17 #90 with 1 refill Last OV: 05/10/18 PCP: Dr. Quay Burow [facility]   [address]

## 2018-05-24 MED ORDER — MELOXICAM 15 MG PO TABS: 15.0000 mg | ORAL_TABLET | Freq: Every day | ORAL | 0 refills | Status: DC

## 2018-06-20 DIAGNOSIS — R278 Other lack of coordination: Secondary | ICD-10-CM | POA: Diagnosis not present

## 2018-06-20 DIAGNOSIS — R296 Repeated falls: Secondary | ICD-10-CM | POA: Diagnosis not present

## 2018-06-20 DIAGNOSIS — M6281 Muscle weakness (generalized): Secondary | ICD-10-CM | POA: Diagnosis not present

## 2018-06-20 DIAGNOSIS — R262 Difficulty in walking, not elsewhere classified: Secondary | ICD-10-CM | POA: Diagnosis not present

## 2018-06-22 DIAGNOSIS — R296 Repeated falls: Secondary | ICD-10-CM | POA: Diagnosis not present

## 2018-06-22 DIAGNOSIS — R262 Difficulty in walking, not elsewhere classified: Secondary | ICD-10-CM | POA: Diagnosis not present

## 2018-06-22 DIAGNOSIS — R278 Other lack of coordination: Secondary | ICD-10-CM | POA: Diagnosis not present

## 2018-06-22 DIAGNOSIS — M6281 Muscle weakness (generalized): Secondary | ICD-10-CM | POA: Diagnosis not present

## 2018-06-27 DIAGNOSIS — M6281 Muscle weakness (generalized): Secondary | ICD-10-CM | POA: Diagnosis not present

## 2018-06-27 DIAGNOSIS — R296 Repeated falls: Secondary | ICD-10-CM | POA: Diagnosis not present

## 2018-06-27 DIAGNOSIS — R262 Difficulty in walking, not elsewhere classified: Secondary | ICD-10-CM | POA: Diagnosis not present

## 2018-06-27 DIAGNOSIS — R278 Other lack of coordination: Secondary | ICD-10-CM | POA: Diagnosis not present

## 2018-06-29 DIAGNOSIS — R278 Other lack of coordination: Secondary | ICD-10-CM | POA: Diagnosis not present

## 2018-06-29 DIAGNOSIS — R262 Difficulty in walking, not elsewhere classified: Secondary | ICD-10-CM | POA: Diagnosis not present

## 2018-06-29 DIAGNOSIS — M6281 Muscle weakness (generalized): Secondary | ICD-10-CM | POA: Diagnosis not present

## 2018-06-29 DIAGNOSIS — R296 Repeated falls: Secondary | ICD-10-CM | POA: Diagnosis not present

## 2018-07-06 DIAGNOSIS — M6281 Muscle weakness (generalized): Secondary | ICD-10-CM | POA: Diagnosis not present

## 2018-07-06 DIAGNOSIS — R262 Difficulty in walking, not elsewhere classified: Secondary | ICD-10-CM | POA: Diagnosis not present

## 2018-07-06 DIAGNOSIS — R278 Other lack of coordination: Secondary | ICD-10-CM | POA: Diagnosis not present

## 2018-07-06 DIAGNOSIS — R296 Repeated falls: Secondary | ICD-10-CM | POA: Diagnosis not present

## 2018-07-14 ENCOUNTER — Ambulatory Visit: Payer: Medicare Other

## 2018-07-18 DIAGNOSIS — R278 Other lack of coordination: Secondary | ICD-10-CM | POA: Diagnosis not present

## 2018-07-18 DIAGNOSIS — R262 Difficulty in walking, not elsewhere classified: Secondary | ICD-10-CM | POA: Diagnosis not present

## 2018-07-18 DIAGNOSIS — M6281 Muscle weakness (generalized): Secondary | ICD-10-CM | POA: Diagnosis not present

## 2018-07-18 DIAGNOSIS — R296 Repeated falls: Secondary | ICD-10-CM | POA: Diagnosis not present

## 2018-07-18 NOTE — Progress Notes (Addendum)
Subjective:   [name] is a 77 y.o. female who presents for Medicare Annual (Subsequent) preventive examination.  Review of Systems:  No ROS.  Medicare Wellness Visit. Additional risk factors are reflected in the social history.  Cardiac Risk Factors include: advanced age (>66men, >64 women);dyslipidemia;hypertension Sleep patterns: has frequent nighttime awakenings, gets up 2-3 times nightly to void and sleeps 6-7 hours nightly.    Home Safety/Smoke Alarms: Feels safe in home. Smoke alarms in place.  Living environment; residence and Firearm Safety: 1-story house/ trailer, no firearms Lives alone, no needs for DME, good support system. Seat Belt Safety/Bike Helmet: Wears seat belt.      Objective:     Vitals: BP 112/71   Pulse (!) 55   Resp 18   Ht 5\' 4"  (1.610 m)   Wt 137 lb (62.1 kg)   SpO2 95%   BMI 23.52 kg/m   Body mass index is 23.52 kg/m.  Advanced Directives 07/19/2018 07/13/2017 03/30/2017 08/20/2016 01/17/2016  Does Patient Have a Medical Advance Directive? Yes Yes Yes Yes No  Type of Paramedic of [place];Living will [place];Living will [place];Living will [place];Living will -  Copy of [place] in Chart? No - copy requested No - copy requested - - -  Would patient like information on creating a medical advance directive? - - - - No - patient declined information    Tobacco Social History   Tobacco Use  Smoking Status Never Smoker  Smokeless Tobacco Never Used     Counseling given: Not Answered   Past Medical History:  Diagnosis Date  . ALLERGIC RHINITIS   . Allergy    SEASONAL  . Anxiety   . Aortic stenosis, moderate   . Blood transfusion without reported diagnosis   . BREAST CANCER 1991   left, 1991; B mastectomy  . Cataract    BILATERAL-REMOVED  . Coronary artery disease    s/p CABGx5 2007 //Nuclear stress test 10/18: EF 76, no ischemia  or infarction, low risk  . Depression   . DIVERTICULOSIS   . DVT   . EROSIVE GASTRITIS   . GERD   . Heart murmur   . History of DVT (deep vein thrombosis)   . HYPERLIPIDEMIA   . Hypertension   . HYPOTHYROIDISM   . Occlusion and stenosis of carotid artery without mention of cerebral infarction   . Osteopenia   . OSTEOPOROSIS   . RHINOSINUSITIS, RECURRENT    Past Surgical History:  Procedure Laterality Date  . APPENDECTOMY    . COLONOSCOPY    . CORONARY ARTERY BYPASS GRAFT     5        2007  . MASTECTOMY     left with reconstruction and lymph node excision  1992  . MASTECTOMY     right with reconstruction  . REVISION RECONSTRUCTED BREAST Left 02/2014   [facility]ard (plastics in [place])  . right ankle arthroscopy    . TONSILLECTOMY AND ADENOIDECTOMY    . UPPER GASTROINTESTINAL ENDOSCOPY    . VAGINAL HYSTERECTOMY     ovaries not excised   Family History  Problem Relation Age of Onset  . Colon cancer Sister   . Colon cancer Sister   . Breast cancer Mother   . Lung disease Mother   . Emphysema Father   . Esophageal cancer Neg Hx   . Rectal cancer Neg Hx   . Stomach cancer Neg Hx  Social History   Socioeconomic History  . Marital status: Divorced    Spouse name: Not on file  . Number of children: Not on file  . Years of education: Not on file  . Highest education level: Not on file  Occupational History  . Not on file  Social Needs  . Financial resource strain: Not hard at all  . Food insecurity:    Worry: Never true    Inability: Never true  . Transportation needs:    Medical: No    Non-medical: No  Tobacco Use  . Smoking status: Never Smoker  . Smokeless tobacco: Never Used  Substance and Sexual Activity  . Alcohol use: No    Alcohol/week: 0.0 standard drinks  . Drug use: No  . Sexual activity: Not Currently  Lifestyle  . Physical activity:    Days per week: 5 days    Minutes per session: 50 min  . Stress: Not at all  Relationships  . Social  connections:    Talks on phone: More than three times a week    Gets together: More than three times a week    Attends religious service: More than 4 times per year    Active member of club or organization: Yes    Attends meetings of clubs or organizations: More than 4 times per year    Relationship status: Divorced  Other Topics Concern  . 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 aspirin 81 MG tablet Take 81 mg by mouth daily.    Marland Kitchen atorvastatin (LIPITOR) 20 MG tablet Take 1 tablet (20 mg total) by mouth daily.  . Calcium Carbonate (CALTRATE 600 PO) Take 1 tablet by mouth 2 (two) times daily.    . chlorpheniramine (CHLOR-TRIMETON) 4 MG tablet Take 4 mg by mouth daily as needed for allergies.  . Coenzyme Q10 (CO Q-10 PO) Take 50 mg by mouth daily.    . fluticasone (FLONASE) 50 MCG/ACT nasal spray INSTILL 1 TO 2 SPRAYS IN EACH NOSTRIL ONCE OR TWICE DAILY  . levothyroxine (SYNTHROID, LEVOTHROID) 75 MCG tablet Take 1 tablet (75 mcg total) by mouth daily before breakfast. -- Office visit needed for further refills  . meloxicam (MOBIC) 15 MG tablet Take 1 tablet (15 mg total) by mouth daily.  . metoprolol tartrate (LOPRESSOR) 25 MG tablet Take 1 tablet (25 mg total) by mouth 2 (two) times daily.  . multivitamin (THERAGRAN) per tablet Take 1 tablet by mouth daily.    Marland Kitchen PROPYLENE GLYCOL OP Place 1 drop into both eyes 4 (four) times daily.  Marland Kitchen senna-docusate (SENOKOT-S) 8.6-50 MG per tablet Take 1 tablet by mouth daily.    Marland Kitchen zolpidem (AMBIEN) 10 MG tablet Take 1 tablet (10 mg total) by mouth at bedtime.  . [DISCONTINUED] atorvastatin (LIPITOR) 20 MG tablet TAKE 1 TABLET BY MOUTH  DAILY  . [DISCONTINUED] metoprolol tartrate (LOPRESSOR) 25 MG tablet TAKE 1 TABLET BY MOUTH TWO  TIMES DAILY   No facility-administered encounter  medications on file as of 07/19/2018.     Activities of Daily Living In your present state of health, do you have any difficulty performing the following activities: 07/19/2018  Hearing? Y  Vision? N  Difficulty concentrating or making decisions? N  Walking or climbing stairs? N  Dressing or bathing? N  Doing errands, shopping? N  Preparing Food and eating ? N  Using the Toilet? N  In the past six months, have you accidently leaked urine? Y  Do you have problems with loss of bowel control? N  Managing your Medications? N  Managing your Finances? N  Housekeeping or managing your Housekeeping? N  Some recent data might be hidden    Patient Care Team: [name], MD as PCP - General (Internal Medicine) [name], MD as Consulting Physician (Cardiology) Deneise Lever, MD as Consulting Physician (Pulmonary Disease) Irene Shipper, MD as Consulting Physician (Gastroenterology) Danella Sensing, MD as Consulting Physician (Dermatology) Loletta Specter, MD (Plastic Surgery) [name], MD (Obstetrics and Gynecology)    Assessment:   This is a routine wellness examination for [name]. Physical assessment deferred to PCP.   Exercise Activities and Dietary recommendations Current Exercise Habits: Home exercise routine, Type of exercise: walking, Time (Minutes): 50, Frequency (Times/Week): 5, Weekly Exercise (Minutes/Week): 250, Intensity: Mild, Exercise limited by: None identified  Diet (meal preparation, eat out, water intake, caffeinated beverages, dairy products, fruits and vegetables): in general, a "healthy" diet  , well balanced.   Reviewed heart healthy and diabetic diet. Encouraged patient to increase daily water and healthy fluid intake.    Goals    . <enter goal here>    . maintian current health status     Be as active and as independent as possible. Enjoy life, family, and worship God.    . Patient Stated     Continue to be socially active and enjoy my  friends, Psychologist, occupational, stay active in the church. Take time for me to do relaxing things like read, garden and do my devotions.        Fall Risk Fall Risk  07/19/2018 07/13/2017 09/01/2016 05/29/2015 05/30/2014  Falls in the past year? No No Yes No No  Number falls in past yr: - - 1 - -  Injury with Fall? - - No - -  Risk for fall due to : Impaired balance/gait;Impaired mobility Impaired balance/gait;Impaired mobility Other (Comment) - -  Risk for fall due to: Comment - - Pt was giving blood and passed out after.  - -    Depression Screen PHQ 2/9 Scores 07/19/2018 07/13/2017 09/01/2016 05/29/2015  PHQ - 2 Score 1 0 0 0  PHQ- 9 Score 4 0 - -     Cognitive Function MMSE - Mini Mental State Exam 07/19/2018 07/13/2017  Orientation to time 5 5  Orientation to Place 5 5  Registration 3 3  Attention/ Calculation 5 5  Recall 1 2  Language- name 2 objects 2 2  Language- repeat 1 1  Language- follow 3 step command 3 3  Language- read & follow direction 1 1  Write a sentence 1 1  Copy design 1 1  Total score 28 29        Immunization History  Administered Date(s) Administered  . H1N1 11/13/2008  . Influenza Split 08/10/2011, 08/28/2012  . Influenza Whole 08/05/2008, 08/18/2009, 08/05/2010  . Influenza, High Dose Seasonal PF 07/13/2017  . Influenza,inj,Quad PF,6+ Mos 08/28/2013, 12/11/2014, 07/30/2015, 07/21/2016  . Pneumococcal Conjugate-13 09/05/2013, 05/29/2015  . Pneumococcal-Unspecified 09/05/2013  . Tetanus 05/30/2014  . Zoster 10/06/2013  . Zoster Recombinat (Shingrix) 03/08/2018, 05/08/2018   Screening Tests Health Maintenance  Topic Date Due  . DEXA SCAN  04/05/2018  . INFLUENZA VACCINE  06/15/2018  . TETANUS/TDAP  05/30/2024  . PNA vac Low Risk Adult  Completed  Plan:     Continue doing brain stimulating activities (puzzles, reading, adult coloring books, staying active) to keep memory sharp.   Continue to eat heart healthy diet (full of fruits, vegetables, whole  grains, lean protein, water--limit salt, fat, and sugar intake) and increase physical activity as tolerated.   I have personally reviewed and noted the following in the patient's chart:   . Medical and social history . Use of alcohol, tobacco or illicit drugs  . Current medications and supplements . Functional ability and status . Nutritional status . Physical activity . Advanced directives . List of other physicians . Vitals . Screenings to include cognitive, depression, and falls . Referrals and appointments  In addition, I have reviewed and discussed with patient certain preventive protocols, quality metrics, and best practice recommendations. A written personalized care plan for preventive services as well as general preventive health recommendations were provided to patient.     Michiel Cowboy, RN  07/19/2018    Medical screening examination/treatment/procedure(s) were performed by non-physician practitioner and as supervising physician I was immediately available for consultation/collaboration. I agree with above. [name], MD

## 2018-07-19 ENCOUNTER — Ambulatory Visit (INDEPENDENT_AMBULATORY_CARE_PROVIDER_SITE_OTHER): Payer: Medicare Other | Admitting: *Deleted

## 2018-07-19 VITALS — BP 112/71 | HR 55 | Resp 18 | Ht 64.0 in | Wt 137.0 lb

## 2018-07-19 DIAGNOSIS — Z23 Encounter for immunization: Secondary | ICD-10-CM | POA: Diagnosis not present

## 2018-07-19 DIAGNOSIS — Z0000 Encounter for general adult medical examination without abnormal findings: Secondary | ICD-10-CM | POA: Diagnosis not present

## 2018-07-19 MED ORDER — METOPROLOL TARTRATE 25 MG PO TABS: 25.0000 mg | ORAL_TABLET | Freq: Two times a day (BID) | ORAL | 1 refills | Status: DC

## 2018-07-19 MED ORDER — ATORVASTATIN CALCIUM 20 MG PO TABS: 20.0000 mg | ORAL_TABLET | Freq: Every day | ORAL | 1 refills | Status: DC

## 2018-07-19 NOTE — Patient Instructions (Addendum)
Continue doing brain stimulating activities (puzzles, reading, adult coloring books, staying active) to keep memory sharp.   Continue to eat heart healthy diet (full of fruits, vegetables, whole grains, lean protein, water--limit salt, fat, and sugar intake) and increase physical activity as tolerated.   [name] , Thank you for taking time to come for your Medicare Wellness Visit. I appreciate your ongoing commitment to your health goals. Please review the following plan we discussed and let me know if I can assist you in the future.   These are the goals we discussed: Goals    . <enter goal here>    . maintian current health status     Be as active and as independent as possible. Enjoy life, family, and worship God.    . Patient Stated     Continue to be socially active and enjoy my friends, Psychologist, occupational, stay active in the church. Take time for me to do relaxing things like read, garden and do my devotions.        This is a list of the screening recommended for you and due dates:  Health Maintenance  Topic Date Due  . DEXA scan (bone density measurement)  04/05/2018  . Flu Shot  06/15/2018  . Tetanus Vaccine  05/30/2024  . Pneumonia vaccines  Completed   Influenza Virus Vaccine injection What is this medicine? INFLUENZA VIRUS VACCINE (in floo EN zuh VAHY ruhs vak SEEN) helps to reduce the risk of getting influenza also known as the flu. The vaccine only helps protect you against some strains of the flu. This medicine may be used for other purposes; ask your health care provider or pharmacist if you have questions. COMMON BRAND NAME(S): Afluria, Agriflu, Alfuria, FLUAD, Fluarix, Fluarix Quadrivalent, Flublok, Flublok Quadrivalent, FLUCELVAX, Flulaval, Fluvirin, Fluzone, Fluzone High-Dose, Fluzone Intradermal What should I tell my health care provider before I take this medicine? They need to know if you have any of these conditions: -bleeding disorder like hemophilia -fever or  infection -Guillain-Barre syndrome or other neurological problems -immune system problems -infection with the human immunodeficiency virus (HIV) or AIDS -low blood platelet counts -multiple sclerosis -an unusual or allergic reaction to influenza virus vaccine, latex, other medicines, foods, dyes, or preservatives. Different brands of vaccines contain different allergens. Some may contain latex or eggs. Talk to your doctor about your allergies to make sure that you get the right vaccine. -pregnant or trying to get pregnant -breast-feeding How should I use this medicine? This vaccine is for injection into a muscle or under the skin. It is given by a health care professional. A copy of Vaccine Information Statements will be given before each vaccination. Read this sheet carefully each time. The sheet may change frequently. Talk to your healthcare provider to see which vaccines are right for you. Some vaccines should not be used in all age groups. Overdosage: If you think you have taken too much of this medicine contact a poison control center or emergency room at once. NOTE: This medicine is only for you. Do not share this medicine with others. What if I miss a dose? This does not apply. What may interact with this medicine? -chemotherapy or radiation therapy -medicines that lower your immune system like etanercept, anakinra, infliximab, and adalimumab -medicines that treat or prevent blood clots like warfarin -phenytoin -steroid medicines like prednisone or cortisone -theophylline -vaccines This list may not describe all possible interactions. Give your health care provider a list of all the medicines, herbs, non-prescription drugs, or  dietary supplements you use. Also tell them if you smoke, drink alcohol, or use illegal drugs. Some items may interact with your medicine. What should I watch for while using this medicine? Report any side effects that do not go away within 3 days to your  doctor or health care professional. Call your health care provider if any unusual symptoms occur within 6 weeks of receiving this vaccine. You may still catch the flu, but the illness is not usually as bad. You cannot get the flu from the vaccine. The vaccine will not protect against colds or other illnesses that may cause fever. The vaccine is needed every year. What side effects may I notice from receiving this medicine? Side effects that you should report to your doctor or health care professional as soon as possible: -allergic reactions like skin rash, itching or hives, swelling of the face, lips, or tongue Side effects that usually do not require medical attention (report to your doctor or health care professional if they continue or are bothersome): -fever -headache -muscle aches and pains -pain, tenderness, redness, or swelling at the injection site -tiredness This list may not describe all possible side effects. Call your doctor for medical advice about side effects. You may report side effects to FDA at 1-800-FDA-1088. Where should I keep my medicine? The vaccine will be given by a health care professional in a clinic, pharmacy, doctor's office, or other health care setting. You will not be given vaccine doses to store at home. NOTE: This sheet is a summary. It may not cover all possible information. If you have questions about this medicine, talk to your doctor, pharmacist, or health care provider.  2018 Elsevier/Gold Standard (2015-05-23 10:07:28)  It is important to avoid accidents which may result in broken bones.  Here are a few ideas on how to make your home safer so you will be less likely to trip or fall.  1. Use nonskid mats or non slip strips in your shower or tub, on your bathroom floor and around sinks.  If you know that you have spilled water, wipe it up! 2. In the bathroom, it is important to have properly installed grab bars on the walls or on the edge of the tub.  Towel  racks are NOT strong enough for you to hold onto or to pull on for support. 3. Stairs and hallways should have enough light.  Add lamps or night lights if you need ore light. 4. It is good to have handrails on both sides of the stairs if possible.  Always fix broken handrails right away. 5. It is important to see the edges of steps.  Paint the edges of outdoor steps white so you can see them better.  Put colored tape on the edge of inside steps. 6. Throw-rugs are dangerous because they can slide.  Removing the rugs is the best idea, but if they must stay, add adhesive carpet tape to prevent slipping. 7. Do not keep things on stairs or in the halls.  Remove small furniture that blocks the halls as it may cause you to trip.  Keep telephone and electrical cords out of the way where you walk. 8. Always were sturdy, rubber-soled shoes for good support.  Never wear just socks, especially on the stairs.  Socks may cause you to slip or fall.  Do not wear full-length housecoats as you can easily trip on the bottom.  9. Place the things you use the most on the shelves that are  the easiest to reach.  If you use a stepstool, make sure it is in good condition.  If you feel unsteady, DO NOT climb, ask for help. 10. If a health professional advises you to use a cane or walker, do not be ashamed.  These items can keep you from falling and breaking your bones.

## 2018-07-21 ENCOUNTER — Other Ambulatory Visit (INDEPENDENT_AMBULATORY_CARE_PROVIDER_SITE_OTHER): Payer: Medicare Other

## 2018-07-21 ENCOUNTER — Other Ambulatory Visit: Payer: Self-pay | Admitting: Internal Medicine

## 2018-07-21 DIAGNOSIS — E039 Hypothyroidism, unspecified: Secondary | ICD-10-CM

## 2018-07-21 DIAGNOSIS — M1990 Unspecified osteoarthritis, unspecified site: Secondary | ICD-10-CM

## 2018-07-21 DIAGNOSIS — E785 Hyperlipidemia, unspecified: Secondary | ICD-10-CM

## 2018-07-21 DIAGNOSIS — I10 Essential (primary) hypertension: Secondary | ICD-10-CM | POA: Diagnosis not present

## 2018-07-21 LAB — CBC WITH DIFFERENTIAL/PLATELET
BASOS PCT: 0.8 % (ref 0.0–3.0)
Basophils Absolute: 0 10*3/uL (ref 0.0–0.1)
EOS PCT: 5 % (ref 0.0–5.0)
Eosinophils Absolute: 0.3 10*3/uL (ref 0.0–0.7)
HEMATOCRIT: 39.8 % (ref 36.0–46.0)
HEMOGLOBIN: 13.3 g/dL (ref 12.0–15.0)
LYMPHS PCT: 33.4 % (ref 12.0–46.0)
Lymphs Abs: 1.8 10*3/uL (ref 0.7–4.0)
MCHC: 33.3 g/dL (ref 30.0–36.0)
MCV: 88.9 fl (ref 78.0–100.0)
MONO ABS: 0.6 10*3/uL (ref 0.1–1.0)
MONOS PCT: 10.4 % (ref 3.0–12.0)
Neutro Abs: 2.7 10*3/uL (ref 1.4–7.7)
Neutrophils Relative %: 50.4 % (ref 43.0–77.0)
Platelets: 223 10*3/uL (ref 150.0–400.0)
RBC: 4.48 Mil/uL (ref 3.87–5.11)
RDW: 14 % (ref 11.5–15.5)
WBC: 5.4 10*3/uL (ref 4.0–10.5)

## 2018-07-21 LAB — TSH: TSH: 0.95 u[IU]/mL (ref 0.35–4.50)

## 2018-07-25 ENCOUNTER — Telehealth: Payer: Self-pay | Admitting: Internal Medicine

## 2018-07-25 DIAGNOSIS — M6281 Muscle weakness (generalized): Secondary | ICD-10-CM | POA: Diagnosis not present

## 2018-07-25 DIAGNOSIS — R262 Difficulty in walking, not elsewhere classified: Secondary | ICD-10-CM | POA: Diagnosis not present

## 2018-07-25 DIAGNOSIS — R296 Repeated falls: Secondary | ICD-10-CM | POA: Diagnosis not present

## 2018-07-25 DIAGNOSIS — R278 Other lack of coordination: Secondary | ICD-10-CM | POA: Diagnosis not present

## 2018-07-25 NOTE — Telephone Encounter (Signed)
Caren Griffins from the lab called to let you know the reason CMP and Lipid panel was not collected is because the patient wanted Dr. Burt Knack her cardiologists to draw these for her.  You can call Caren Griffins back at ext [phone]

## 2018-07-28 ENCOUNTER — Other Ambulatory Visit: Payer: Medicare Other | Admitting: *Deleted

## 2018-07-28 DIAGNOSIS — I2510 Atherosclerotic heart disease of native coronary artery without angina pectoris: Secondary | ICD-10-CM | POA: Diagnosis not present

## 2018-07-28 DIAGNOSIS — I350 Nonrheumatic aortic (valve) stenosis: Secondary | ICD-10-CM

## 2018-07-29 LAB — COMPREHENSIVE METABOLIC PANEL
ALK PHOS: 72 IU/L (ref 39–117)
ALT: 21 IU/L (ref 0–32)
AST: 29 IU/L (ref 0–40)
Albumin/Globulin Ratio: 2 (ref 1.2–2.2)
Albumin: 4.3 g/dL (ref 3.5–4.8)
BUN/Creatinine Ratio: 27 (ref 12–28)
BUN: 19 mg/dL (ref 8–27)
Bilirubin Total: 0.8 mg/dL (ref 0.0–1.2)
CALCIUM: 9.4 mg/dL (ref 8.7–10.3)
CO2: 25 mmol/L (ref 20–29)
CREATININE: 0.71 mg/dL (ref 0.57–1.00)
Chloride: 100 mmol/L (ref 96–106)
GFR calc Af Amer: 94 mL/min/{1.73_m2} (ref >59)
GFR, EST NON AFRICAN AMERICAN: 81 mL/min/{1.73_m2} (ref >59)
GLUCOSE: 93 mg/dL (ref 65–99)
Globulin, Total: 2.1 g/dL (ref 1.5–4.5)
Potassium: 4.4 mmol/L (ref 3.5–5.2)
SODIUM: 140 mmol/L (ref 134–144)
Total Protein: 6.4 g/dL (ref 6.0–8.5)

## 2018-07-29 LAB — LIPID PANEL
CHOL/HDL RATIO: 2.6 ratio (ref 0.0–4.4)
CHOLESTEROL TOTAL: 134 mg/dL (ref 100–199)
HDL: 52 mg/dL (ref >39)
LDL CALC: 71 mg/dL (ref 0–99)
TRIGLYCERIDES: 56 mg/dL (ref 0–149)
VLDL Cholesterol Cal: 11 mg/dL (ref 5–40)

## 2018-07-31 ENCOUNTER — Encounter: Payer: Self-pay | Admitting: Cardiovascular Disease

## 2018-07-31 ENCOUNTER — Other Ambulatory Visit: Payer: Self-pay

## 2018-07-31 ENCOUNTER — Ambulatory Visit (HOSPITAL_COMMUNITY): Payer: Medicare Other | Attending: Cardiology

## 2018-07-31 ENCOUNTER — Ambulatory Visit (INDEPENDENT_AMBULATORY_CARE_PROVIDER_SITE_OTHER): Payer: Medicare Other | Admitting: Cardiovascular Disease

## 2018-07-31 VITALS — BP 132/82 | HR 60 | Ht 64.0 in | Wt 139.5 lb

## 2018-07-31 DIAGNOSIS — I5032 Chronic diastolic (congestive) heart failure: Secondary | ICD-10-CM | POA: Diagnosis not present

## 2018-07-31 DIAGNOSIS — Z951 Presence of aortocoronary bypass graft: Secondary | ICD-10-CM | POA: Insufficient documentation

## 2018-07-31 DIAGNOSIS — I779 Disorder of arteries and arterioles, unspecified: Secondary | ICD-10-CM | POA: Diagnosis not present

## 2018-07-31 DIAGNOSIS — R011 Cardiac murmur, unspecified: Secondary | ICD-10-CM | POA: Diagnosis not present

## 2018-07-31 DIAGNOSIS — I083 Combined rheumatic disorders of mitral, aortic and tricuspid valves: Secondary | ICD-10-CM | POA: Diagnosis not present

## 2018-07-31 DIAGNOSIS — I739 Peripheral vascular disease, unspecified: Secondary | ICD-10-CM

## 2018-07-31 DIAGNOSIS — I2510 Atherosclerotic heart disease of native coronary artery without angina pectoris: Secondary | ICD-10-CM | POA: Diagnosis not present

## 2018-07-31 DIAGNOSIS — E039 Hypothyroidism, unspecified: Secondary | ICD-10-CM | POA: Diagnosis not present

## 2018-07-31 DIAGNOSIS — I350 Nonrheumatic aortic (valve) stenosis: Secondary | ICD-10-CM | POA: Diagnosis not present

## 2018-07-31 DIAGNOSIS — I088 Other rheumatic multiple valve diseases: Secondary | ICD-10-CM | POA: Insufficient documentation

## 2018-07-31 DIAGNOSIS — Z86718 Personal history of other venous thrombosis and embolism: Secondary | ICD-10-CM | POA: Insufficient documentation

## 2018-07-31 NOTE — Progress Notes (Signed)
Cardiology Office Note:    Date:  07/31/2018   ID:  [name], DOB December 19, 1940, MRN [id number]  PCP:  [name], MD  Cardiologist:  [name], MD  Electrophysiologist:  None   Referring MD: [name], MD   Chief Complaint  Patient presents with  . Shortness of Breath    History of Present Illness:    [name] is a 77 y.o. female with a hx of CAD and aortic stenosis, presenting for follow-up evaluation. The patient underwent multivessel CABG in 2007.  Cardiac catheterization in 2011 demonstrated patency of all of her bypass grafts.  She has had mild to moderate aortic stenosis by serial echo studies.  The patient is here alone today.  She is doing well from a cardiac perspective.  She has mild shortness of breath with exertion.  This is unchanged over time.  She denies orthopnea, PND, or leg swelling.  She denies chest pain, chest pressure, or heart palpitations.  She is had some problems with gait instability and has had a few falls when she is turned quickly.  She is working with physical therapy on this.  Past Medical History:  Diagnosis Date  . ALLERGIC RHINITIS   . Allergy    SEASONAL  . Anxiety   . Aortic stenosis, moderate   . Blood transfusion without reported diagnosis   . BREAST CANCER 1991   left, 1991; B mastectomy  . Cataract    BILATERAL-REMOVED  . Coronary artery disease    s/p CABGx5 2007 //Nuclear stress test 10/18: EF 76, no ischemia or infarction, low risk  . Depression   . DIVERTICULOSIS   . DVT   . EROSIVE GASTRITIS   . GERD   . Heart murmur   . History of DVT (deep vein thrombosis)   . HYPERLIPIDEMIA   . Hypertension   . HYPOTHYROIDISM   . Occlusion and stenosis of carotid artery without mention of cerebral infarction   . Osteopenia   . OSTEOPOROSIS   . RHINOSINUSITIS, RECURRENT     Past Surgical History:  Procedure Laterality Date  . APPENDECTOMY    . COLONOSCOPY    . CORONARY ARTERY BYPASS GRAFT     5        2007    . MASTECTOMY     left with reconstruction and lymph node excision  1992  . MASTECTOMY     right with reconstruction  . REVISION RECONSTRUCTED BREAST Left 02/2014   willard (plastics in [place])  . right ankle arthroscopy    . TONSILLECTOMY AND ADENOIDECTOMY    . UPPER GASTROINTESTINAL ENDOSCOPY    . VAGINAL HYSTERECTOMY     ovaries not excised    Current Medications: Current Meds  Medication Sig  . aspirin 81 MG tablet Take 81 mg by mouth daily.    Marland Kitchen atorvastatin (LIPITOR) 20 MG tablet Take 1 tablet (20 mg total) by mouth daily.  . Calcium Carbonate (CALTRATE 600 PO) Take 1 tablet by mouth 2 (two) times daily.    . chlorpheniramine (CHLOR-TRIMETON) 4 MG tablet Take 4 mg by mouth daily as needed for allergies.  . Coenzyme Q10 (CO Q-10 PO) Take 50 mg by mouth daily.    . fluticasone (FLONASE) 50 MCG/ACT nasal spray INSTILL 1 TO 2 SPRAYS IN EACH NOSTRIL ONCE OR TWICE DAILY  . levothyroxine (SYNTHROID, LEVOTHROID) 75 MCG tablet Take 1 tablet (75 mcg total) by mouth daily before breakfast. -- Office visit needed for further refills  .  meloxicam (MOBIC) 15 MG tablet Take 1 tablet (15 mg total) by mouth daily.  . metoprolol tartrate (LOPRESSOR) 25 MG tablet Take 1 tablet (25 mg total) by mouth 2 (two) times daily.  . multivitamin (THERAGRAN) per tablet Take 1 tablet by mouth daily.    Marland Kitchen PROPYLENE GLYCOL OP Place 1 drop into both eyes 4 (four) times daily.  Marland Kitchen senna-docusate (SENOKOT-S) 8.6-50 MG per tablet Take 1 tablet by mouth daily.    Marland Kitchen zolpidem (AMBIEN) 10 MG tablet Take 1 tablet (10 mg total) by mouth at bedtime.     Allergies:   Clarithromycin; Codeine; Levofloxacin; Morphine; Naproxen; Penicillins; and Pneumococcal vaccines   Social History   Socioeconomic History  . Marital status: Divorced    Spouse name: Not on file  . Number of children: Not on file  . Years of education: Not on file  . Highest education level: Not on file  Occupational History  . Not on file  Social Needs   . Financial resource strain: Not hard at all  . Food insecurity:    Worry: Never true    Inability: Never true  . Transportation needs:    Medical: No    Non-medical: No  Tobacco Use  . Smoking status: Never Smoker  . Smokeless tobacco: Never Used  Substance and Sexual Activity  . Alcohol use: No    Alcohol/week: 0.0 standard drinks  . Drug use: No  . Sexual activity: Not Currently  Lifestyle  . Physical activity:    Days per week: 5 days    Minutes per session: 50 min  . Stress: Not at all  Relationships  . Social connections:    Talks on phone: More than three times a week    Gets together: More than three times a week    Attends religious service: More than 4 times per year    Active member of club or organization: Yes    Attends meetings of clubs or organizations: More than 4 times per year    Relationship status: Divorced  Other Topics Concern  . 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 There is no history of Esophageal cancer, Rectal cancer, or Stomach cancer.  ROS:   Please see the history of present illness.    All other systems reviewed and are negative.  EKGs/Labs/Other Studies Reviewed:    The following studies were reviewed today: Today's echo is reviewed and demonstrates vigorous LV systolic function.  The aortic valve is moderately calcified and restricted with hemodynamic findings suggestive of moderate aortic stenosis with a mean gradient of 18 mmHg.  EKG:  EKG is not ordered today.   Recent Labs: 07/21/2018: Hemoglobin 13.3; Platelets 223.0; TSH 0.95 07/28/2018: ALT 21; BUN 19; Creatinine, Ser 0.71; Potassium 4.4; Sodium 140  Recent Lipid Panel    Component Value Date/Time    CHOL 134 07/28/2018 1153   TRIG 56 07/28/2018 1153   HDL 52 07/28/2018 1153   CHOLHDL 2.6 07/28/2018 1153   CHOLHDL 2.5 04/05/2016 1059   VLDL 13 04/05/2016 1059   LDLCALC 71 07/28/2018 1153    Physical Exam:    VS:  BP 132/82   Pulse 60   Ht 5'  4" (1.626 m)   Wt 139 lb 8 oz (63.3 kg)   SpO2 98%   BMI 23.95 kg/m     Wt Readings from Last 3 Encounters:  07/31/18 139 lb 8 oz (63.3 kg)  07/19/18 137 lb (62.1 kg)  05/10/18 139 lb (63 kg)     GEN:  Well nourished, well developed in no acute distress HEENT: Normal NECK: No JVD; No carotid bruits LYMPHATICS: No lymphadenopathy CARDIAC: RRR, there is a 3/6 harsh mid peaking systolic murmur with preserved A2.  This is best heard at the right upper sternal border.   RESPIRATORY:  Clear to auscultation without rales, wheezing or rhonchi  ABDOMEN: Soft, non-tender, non-distended MUSCULOSKELETAL:  No edema; No deformity  SKIN: Warm and dry NEUROLOGIC:  Alert and oriented x 3 PSYCHIATRIC:  Normal affect   ASSESSMENT:    1. Coronary artery disease involving native coronary artery of native heart without angina pectoris   2. Chronic diastolic heart failure ([place])   3. Aortic stenosis, moderate   4. Carotid artery disease, unspecified laterality, unspecified type ([place])    PLAN:    In order of problems listed above:  1. The patient is stable on a combination of aspirin and atorvastatin.  She is tolerating a beta-blocker. 2. The patient has New York Heart Association functional class II symptoms.  There is no evidence of volume overload on exam.  She is not requiring diuretic therapy. 3. The patient's exam and echo findings are consistent with moderate aortic stenosis.  She should continue with yearly surveillance. 4. Reviewed last carotid duplex from 2016. Will update her duplex exam as recommended.   Medication Adjustments/Labs and Tests Ordered: Current medicines are reviewed at length with the patient today.  Concerns regarding  medicines are outlined above.  No orders of the defined types were placed in this encounter.  No orders of the defined types were placed in this encounter.   Patient Instructions  Medication Instructions:  Your provider recommends that you continue on your current medications as directed. Please refer to the Current Medication list given to you today.    Labwork: None  Testing/Procedures: None  Follow-Up: Your provider wants you to follow-up in: 6 months with Dr. Burt Knack. You will receive a reminder letter in the mail two months in advance. If you don't receive a letter, please call our office to schedule the follow-up appointment.    Any Other Special Instructions Will Be Listed Below (If Applicable).     If you need a refill on your cardiac medications before your next appointment, please call your pharmacy.      Signed, [name], MD  07/31/2018 5:06 PM    [place]

## 2018-07-31 NOTE — Patient Instructions (Signed)

## 2018-08-01 DIAGNOSIS — R262 Difficulty in walking, not elsewhere classified: Secondary | ICD-10-CM | POA: Diagnosis not present

## 2018-08-01 DIAGNOSIS — R278 Other lack of coordination: Secondary | ICD-10-CM | POA: Diagnosis not present

## 2018-08-01 DIAGNOSIS — R296 Repeated falls: Secondary | ICD-10-CM | POA: Diagnosis not present

## 2018-08-01 DIAGNOSIS — M6281 Muscle weakness (generalized): Secondary | ICD-10-CM | POA: Diagnosis not present

## 2018-08-03 DIAGNOSIS — R262 Difficulty in walking, not elsewhere classified: Secondary | ICD-10-CM | POA: Diagnosis not present

## 2018-08-03 DIAGNOSIS — R278 Other lack of coordination: Secondary | ICD-10-CM | POA: Diagnosis not present

## 2018-08-03 DIAGNOSIS — M6281 Muscle weakness (generalized): Secondary | ICD-10-CM | POA: Diagnosis not present

## 2018-08-03 DIAGNOSIS — R296 Repeated falls: Secondary | ICD-10-CM | POA: Diagnosis not present

## 2018-08-08 DIAGNOSIS — R278 Other lack of coordination: Secondary | ICD-10-CM | POA: Diagnosis not present

## 2018-08-08 DIAGNOSIS — R296 Repeated falls: Secondary | ICD-10-CM | POA: Diagnosis not present

## 2018-08-08 DIAGNOSIS — R262 Difficulty in walking, not elsewhere classified: Secondary | ICD-10-CM | POA: Diagnosis not present

## 2018-08-08 DIAGNOSIS — M6281 Muscle weakness (generalized): Secondary | ICD-10-CM | POA: Diagnosis not present

## 2018-08-10 DIAGNOSIS — R296 Repeated falls: Secondary | ICD-10-CM | POA: Diagnosis not present

## 2018-08-10 DIAGNOSIS — M6281 Muscle weakness (generalized): Secondary | ICD-10-CM | POA: Diagnosis not present

## 2018-08-10 DIAGNOSIS — R278 Other lack of coordination: Secondary | ICD-10-CM | POA: Diagnosis not present

## 2018-08-10 DIAGNOSIS — R262 Difficulty in walking, not elsewhere classified: Secondary | ICD-10-CM | POA: Diagnosis not present

## 2018-08-11 ENCOUNTER — Encounter (HOSPITAL_COMMUNITY): Payer: Medicare Other

## 2018-08-11 ENCOUNTER — Ambulatory Visit (HOSPITAL_COMMUNITY)
Admission: RE | Admit: 2018-08-11 | Discharge: 2018-08-11 | Disposition: A | Discharge status: Home / Self Care | Payer: Medicare Other | Source: Ambulatory Visit | Attending: Cardiology | Admitting: Cardiology

## 2018-08-11 DIAGNOSIS — I739 Peripheral vascular disease, unspecified: Secondary | ICD-10-CM

## 2018-08-11 DIAGNOSIS — I779 Disorder of arteries and arterioles, unspecified: Secondary | ICD-10-CM

## 2018-08-13 ENCOUNTER — Other Ambulatory Visit: Payer: Self-pay | Admitting: Internal Medicine

## 2018-08-29 DIAGNOSIS — L570 Actinic keratosis: Secondary | ICD-10-CM | POA: Diagnosis not present

## 2018-08-29 DIAGNOSIS — Z85828 Personal history of other malignant neoplasm of skin: Secondary | ICD-10-CM | POA: Diagnosis not present

## 2018-08-29 DIAGNOSIS — C44519 Basal cell carcinoma of skin of other part of trunk: Secondary | ICD-10-CM | POA: Diagnosis not present

## 2018-08-29 DIAGNOSIS — L821 Other seborrheic keratosis: Secondary | ICD-10-CM | POA: Diagnosis not present

## 2018-08-29 DIAGNOSIS — D485 Neoplasm of uncertain behavior of skin: Secondary | ICD-10-CM | POA: Diagnosis not present

## 2018-09-08 ENCOUNTER — Other Ambulatory Visit: Payer: Self-pay | Admitting: Internal Medicine

## 2018-09-08 NOTE — Telephone Encounter (Signed)
Check [place] registry last filled 05/10/2018.Marland KitchenJohny Mullen

## 2018-09-22 DIAGNOSIS — H04552 Acquired stenosis of left nasolacrimal duct: Secondary | ICD-10-CM | POA: Diagnosis not present

## 2018-09-28 ENCOUNTER — Telehealth: Payer: Self-pay | Admitting: Internal Medicine

## 2018-09-28 MED ORDER — LEVOTHYROXINE SODIUM 75 MCG PO TABS: ORAL_TABLET | ORAL | 0 refills | Status: DC

## 2018-09-28 NOTE — Telephone Encounter (Signed)
Medication left for pt to return call to office regarding medication refill of Levothyroxine. Medication was previously sent to [facility] mail order service on 08/14/18 #90 with 1 refill  and wanted to confirm the pt wanted to use local pharmacy instead of mail order pharmacy.

## 2018-09-28 NOTE — Addendum Note (Signed)
Addended by: Dimple Nanas on: 09/28/2018 01:09 PM   Modules accepted: Orders

## 2018-09-28 NOTE — Telephone Encounter (Signed)
Copied from [place] [id number]. Topic: Quick Communication - See Telephone Encounter >> Sep 28, 2018 12:59 PM Vernona Rieger wrote: CRM for notification. See Telephone encounter for: 09/28/18.  Patient said she would like the nurse to know that she would like to get her medication for her thyroid now at [id number] - [place], [place]. AT [address]. [address] [place] [address]  levothyroxine (SYNTHROID, LEVOTHROID) 75 MCG tablet, 90 day supply.

## 2018-10-11 DIAGNOSIS — H04123 Dry eye syndrome of bilateral lacrimal glands: Secondary | ICD-10-CM | POA: Diagnosis not present

## 2018-10-11 DIAGNOSIS — H02532 Eyelid retraction right lower eyelid: Secondary | ICD-10-CM | POA: Diagnosis not present

## 2018-10-11 DIAGNOSIS — H04553 Acquired stenosis of bilateral nasolacrimal duct: Secondary | ICD-10-CM | POA: Diagnosis not present

## 2018-10-11 DIAGNOSIS — H04223 Epiphora due to insufficient drainage, bilateral lacrimal glands: Secondary | ICD-10-CM | POA: Diagnosis not present

## 2018-10-11 DIAGNOSIS — H02135 Senile ectropion of left lower eyelid: Secondary | ICD-10-CM | POA: Diagnosis not present

## 2018-10-11 DIAGNOSIS — H02142 Spastic ectropion of right lower eyelid: Secondary | ICD-10-CM | POA: Diagnosis not present

## 2018-10-11 DIAGNOSIS — H16212 Exposure keratoconjunctivitis, left eye: Secondary | ICD-10-CM | POA: Diagnosis not present

## 2018-10-11 DIAGNOSIS — H02132 Senile ectropion of right lower eyelid: Secondary | ICD-10-CM | POA: Diagnosis not present

## 2018-10-11 DIAGNOSIS — H02145 Spastic ectropion of left lower eyelid: Secondary | ICD-10-CM | POA: Diagnosis not present

## 2018-10-11 DIAGNOSIS — H04412 Chronic dacryocystitis of left lacrimal passage: Secondary | ICD-10-CM | POA: Diagnosis not present

## 2018-10-11 DIAGNOSIS — H16213 Exposure keratoconjunctivitis, bilateral: Secondary | ICD-10-CM | POA: Diagnosis not present

## 2018-10-11 DIAGNOSIS — H16211 Exposure keratoconjunctivitis, right eye: Secondary | ICD-10-CM | POA: Diagnosis not present

## 2018-12-07 ENCOUNTER — Other Ambulatory Visit: Payer: Self-pay | Admitting: Internal Medicine

## 2018-12-08 NOTE — Telephone Encounter (Signed)
Last refill was 09/08/2018. Last OV 05/11/15 Next OV not made

## 2018-12-26 ENCOUNTER — Other Ambulatory Visit: Payer: Self-pay | Admitting: Internal Medicine

## 2018-12-27 ENCOUNTER — Other Ambulatory Visit: Payer: Self-pay

## 2018-12-27 MED ORDER — LEVOTHYROXINE SODIUM 75 MCG PO TABS: ORAL_TABLET | ORAL | 0 refills | Status: DC

## 2018-12-28 ENCOUNTER — Telehealth: Payer: Self-pay

## 2018-12-28 NOTE — Telephone Encounter (Signed)
Rx sent in for 90 days. Pt is aware.

## 2018-12-28 NOTE — Telephone Encounter (Signed)
Copied from [place] [id number]. Topic: General - Other >> Dec 27, 2018  4:12 PM Leward Quan A wrote: [facility] but there was only a thirty day supply and she is used to having a 90 day supply so she said she will not pick that up because of the inconvenience. Patient say that she have 5 days worth left and will do without until the appointment scheduled because she need her 90 day supply. Patient would like a call back please with an answer please advise. Ph# [phone]

## 2019-01-09 DIAGNOSIS — H04123 Dry eye syndrome of bilateral lacrimal glands: Secondary | ICD-10-CM | POA: Diagnosis not present

## 2019-01-11 NOTE — Progress Notes (Signed)
Subjective:    Patient ID: [name], female    DOB: [birth year], 78 y.o.   MRN: [id number]  HPI The patient is here for follow up.   Hyperlipidemia: She is taking her medication daily. She is compliant with a low fat/cholesterol diet. She is exercising regularly. She denies myalgias.   Hypertension: She is taking her medication daily. She is compliant with a low sodium diet.  She denies chest pain, palpitations, edema, shortness of breath and regular headaches. She is exercising regularly.  She does not monitor her blood pressure at home.    Hypothyroidism:  She is taking her medication daily.  She denies any recent changes in energy or weight that are unexplained.   Insomnia:  She takes ambien nightly.  She takes 1/4 of a pill.  She denies no side effects.    Left breast started to hurt the other night - 4 days ago.  She had reast cancer in that breast 28 years ago.  It was tnder.  It is no longer tender.  It moves and it hard.    S/o left mastectomy and prohyl on right one year later.    Osteoporosis: She is almost due for her bone density scan.  She is doing some exercise.  She is taking calcium and vitamin D on a daily basis.  Medications and allergies reviewed with patient and updated if appropriate.  Patient Active Problem List   Diagnosis Date Noted  . Poor balance 05/10/2018  . Left ventricular diastolic dysfunction 59/16/3846  . Essential hypertension 07/27/2017  . Fall 03/02/2017  . Osteoarthritis 02/16/2017  . Urinary incontinence 02/16/2017  . Chest pain 09/01/2016  . Chronic venous insufficiency 08/20/2016  . Xiphoid pain 03/05/2016  . Insomnia 01/16/2016  . Anxiety and depression 12/24/2015  . Seasonal and perennial allergic rhinitis 11/24/2015  . Asthma, mild intermittent, well-controlled 11/24/2015  . Carotid artery disease ([place]) 06/29/2011  . Hypothyroidism 11/11/2010  . Hyperlipidemia 11/11/2010  . Senile osteoporosis 11/11/2010  . RHINOSINUSITIS,  RECURRENT 10/29/2008  . Personal history of malignant neoplasm of breast 03/27/2008  . GERD 03/27/2008  . DIVERTICULOSIS 03/27/2008  . CAD (coronary artery disease) 08/23/2007  . Aortic stenosis, moderate 08/23/2007  . DVT 08/23/2007    Current Outpatient Medications on File Prior to Visit  Medication Sig Dispense Refill  . aspirin 81 MG tablet Take 81 mg by mouth daily.      Marland Kitchen atorvastatin (LIPITOR) 20 MG tablet Take 1 tablet (20 mg total) by mouth daily. 90 tablet 1  . Calcium Carbonate (CALTRATE 600 PO) Take 1 tablet by mouth 2 (two) times daily.      . chlorpheniramine (CHLOR-TRIMETON) 4 MG tablet Take 4 mg by mouth daily as needed for allergies.    . Coenzyme Q10 (CO Q-10 PO) Take 50 mg by mouth daily.      . fluticasone (FLONASE) 50 MCG/ACT nasal spray INSTILL 1 TO 2 SPRAYS IN EACH NOSTRIL ONCE OR TWICE DAILY 16 g 0  . levothyroxine (SYNTHROID, LEVOTHROID) 75 MCG tablet TAKE 1 TABLET BY MOUTH  DAILY BEFORE BREAKFAST. Need office visit for more refills. 90 tablet 0  . meloxicam (MOBIC) 15 MG tablet Take 1 tablet (15 mg total) by mouth daily. 90 tablet 0  . metoprolol tartrate (LOPRESSOR) 25 MG tablet Take 1 tablet (25 mg total) by mouth 2 (two) times daily. 180 tablet 1  . multivitamin (THERAGRAN) per tablet Take 1 tablet by mouth daily.      Marland Kitchen  PROPYLENE GLYCOL OP Place 1 drop into both eyes 4 (four) times daily.    Marland Kitchen senna-docusate (SENOKOT-S) 8.6-50 MG per tablet Take 1 tablet by mouth daily.      Marland Kitchen zolpidem (AMBIEN) 10 MG tablet Take 1 tablet by mouth daily at bedtime. Need office visit for more refills. 30 tablet 0   No current facility-administered medications on file prior to visit.     Past Medical History:  Diagnosis Date  . ALLERGIC RHINITIS   . Allergy    SEASONAL  . Anxiety   . Aortic stenosis, moderate   . Blood transfusion without reported diagnosis   . BREAST CANCER 1991   left, 1991; B mastectomy  . Cataract    BILATERAL-REMOVED  . Coronary artery disease      s/p CABGx5 2007 //Nuclear stress test 10/18: EF 76, no ischemia or infarction, low risk  . Depression   . DIVERTICULOSIS   . DVT   . EROSIVE GASTRITIS   . GERD   . Heart murmur   . History of DVT (deep vein thrombosis)   . HYPERLIPIDEMIA   . Hypertension   . HYPOTHYROIDISM   . Occlusion and stenosis of carotid artery without mention of cerebral infarction   . Osteopenia   . OSTEOPOROSIS   . RHINOSINUSITIS, RECURRENT     Past Surgical History:  Procedure Laterality Date  . APPENDECTOMY    . COLONOSCOPY    . CORONARY ARTERY BYPASS GRAFT     5        2007  . MASTECTOMY     left with reconstruction and lymph node excision  1992  . MASTECTOMY     right with reconstruction  . REVISION RECONSTRUCTED BREAST Left 02/2014   willard (plastics in [place])  . right ankle arthroscopy    . TONSILLECTOMY AND ADENOIDECTOMY    . UPPER GASTROINTESTINAL ENDOSCOPY    . VAGINAL HYSTERECTOMY     ovaries not excised    Social History   Socioeconomic History  . Marital status: Divorced    Spouse name: Not on file  . Number of children: Not on file  . Years of education: Not on file  . Highest education level: Not on file  Occupational History  . Not on file  Social Needs  . Financial resource strain: Not hard at all  . Food insecurity:    Worry: Never true    Inability: Never true  . Transportation needs:    Medical: No    Non-medical: No  Tobacco Use  . Smoking status: Never Smoker  . Smokeless tobacco: Never Used  Substance and Sexual Activity  . Alcohol use: No    Alcohol/week: 0.0 standard drinks  . Drug use: No  . Sexual activity: Not Currently  Lifestyle  . Physical activity:    Days per week: 5 days    Minutes per session: 50 min  . Stress: Not at all  Relationships  . Social connections:    Talks on phone: More than three times a week    Gets together: More than three times a week    Attends religious service: More than 4 times per year    Active member of club or  organization: Yes    Attends meetings of clubs or organizations: More than 4 times per year    Relationship status: Divorced  Other Topics Concern  . 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 Colon cancer Sister   . Colon cancer Sister   . Breast cancer Mother   . Lung disease Mother   . Emphysema Father   . Esophageal cancer Neg Hx   . Rectal cancer Neg Hx   . Stomach cancer Neg Hx     Review of Systems  Constitutional: Negative for chills and fever.  Respiratory: Negative for cough, shortness of breath and wheezing.   Cardiovascular: Negative for chest pain, palpitations and leg swelling.  Neurological: Negative for light-headedness and headaches.       Objective:   Vitals:   01/12/19 1504  BP: 140/66  Pulse: 61  Resp: 16  Temp: 97.6 F (36.4 C)  SpO2: 99%   BP Readings from Last 3 Encounters:  01/12/19 140/66  07/31/18 132/82  07/19/18 112/71   Wt Readings from Last 3 Encounters:  01/12/19 138 lb (62.6 kg)  07/31/18 139 lb 8 oz (63.3 kg)  07/19/18 137 lb (62.1 kg)   Body mass index is 23.69 kg/m.   Physical Exam    Constitutional: Appears well-developed and well-nourished. No distress.  HENT:  Head: Normocephalic and atraumatic.  Neck: Neck supple. No tracheal deviation present. No thyromegaly present.  No cervical lymphadenopathy Cardiovascular: Normal rate, regular rhythm and normal heart sounds.   No murmur heard. No carotid bruit .  No edema Pulmonary/Chest: Effort normal and breath sounds normal. No respiratory distress. No has no wheezes. No rales. Breast, left: Status post mastectomy-implant in place with scar across central breast, in the middle of the breast there is a small pea-sized lump that is nontender that is just underneath the scar-she is unsure if this was there since surgery do  not-nontender Skin: Skin is warm and dry. Not diaphoretic.  Psychiatric: Normal mood and affect. Behavior is normal.      Assessment & Plan:    See Problem List for Assessment and Plan of chronic medical problems.

## 2019-01-12 ENCOUNTER — Ambulatory Visit (INDEPENDENT_AMBULATORY_CARE_PROVIDER_SITE_OTHER): Payer: Medicare Other | Admitting: Internal Medicine

## 2019-01-12 ENCOUNTER — Encounter: Payer: Self-pay | Admitting: Internal Medicine

## 2019-01-12 VITALS — BP 140/66 | HR 61 | Temp 97.6°F | Resp 16 | Ht 64.0 in | Wt 138.0 lb

## 2019-01-12 DIAGNOSIS — E039 Hypothyroidism, unspecified: Secondary | ICD-10-CM | POA: Diagnosis not present

## 2019-01-12 DIAGNOSIS — E785 Hyperlipidemia, unspecified: Secondary | ICD-10-CM

## 2019-01-12 DIAGNOSIS — G4700 Insomnia, unspecified: Secondary | ICD-10-CM

## 2019-01-12 DIAGNOSIS — N644 Mastodynia: Secondary | ICD-10-CM | POA: Diagnosis not present

## 2019-01-12 DIAGNOSIS — M810 Age-related osteoporosis without current pathological fracture: Secondary | ICD-10-CM

## 2019-01-12 DIAGNOSIS — I10 Essential (primary) hypertension: Secondary | ICD-10-CM | POA: Diagnosis not present

## 2019-01-12 MED ORDER — ATORVASTATIN CALCIUM 20 MG PO TABS: 20.0000 mg | ORAL_TABLET | Freq: Every day | ORAL | 1 refills | Status: DC

## 2019-01-12 MED ORDER — METOPROLOL TARTRATE 25 MG PO TABS: 25.0000 mg | ORAL_TABLET | Freq: Two times a day (BID) | ORAL | 1 refills | Status: DC

## 2019-01-12 NOTE — Assessment & Plan Note (Signed)
BP well controlled Current regimen effective and well tolerated Continue current medications at current doses cmp  

## 2019-01-12 NOTE — Patient Instructions (Addendum)
   Medications reviewed and updated.  Changes include :   none  Your prescription(s) have been submitted to your pharmacy. Please take as directed and contact our office if you believe you are having problem(s) with the medication(s).    Please followup in 6 months   

## 2019-01-13 DIAGNOSIS — N644 Mastodynia: Secondary | ICD-10-CM | POA: Insufficient documentation

## 2019-01-13 NOTE — Assessment & Plan Note (Signed)
Check lipid panel  Continue daily statin Regular exercise and healthy diet encouraged  

## 2019-01-13 NOTE — Assessment & Plan Note (Signed)
Clinically euthyroid Check tsh  Titrate med dose if needed  

## 2019-01-13 NOTE — Assessment & Plan Note (Signed)
The other night had pain in her left breast that was at an area where there was a small bump-it was tender to palpation No longer tender History of left-sided breast cancer 28 years ago status post left mastectomy, right prophylactic mastectomy Has implants There is a small bump that is not tender-just under the scare, which may have been there since surgery She is no longer having tenderness Plans on seeing her gynecologist soon so we will have her palpate Consider evaluation by 1 of her surgeons if pain recurs or bump persists

## 2019-01-13 NOTE — Assessment & Plan Note (Signed)
DEXA due in a couple of months-we will order Continue calcium and vitamin D daily Stressed the importance of continuing regular exercise

## 2019-01-13 NOTE — Assessment & Plan Note (Signed)
Takes a quarter of a pill of Ambien at night Effective, no side effects Has been on it for years We will continue

## 2019-01-17 ENCOUNTER — Ambulatory Visit (INDEPENDENT_AMBULATORY_CARE_PROVIDER_SITE_OTHER): Payer: Medicare Other | Admitting: Obstetrics & Gynecology

## 2019-01-17 ENCOUNTER — Encounter: Payer: Self-pay | Admitting: Obstetrics & Gynecology

## 2019-01-17 ENCOUNTER — Telehealth: Payer: Self-pay | Admitting: *Deleted

## 2019-01-17 VITALS — BP 128/80

## 2019-01-17 DIAGNOSIS — N6320 Unspecified lump in the left breast, unspecified quadrant: Secondary | ICD-10-CM | POA: Diagnosis not present

## 2019-01-17 DIAGNOSIS — Z9013 Acquired absence of bilateral breasts and nipples: Secondary | ICD-10-CM

## 2019-01-17 DIAGNOSIS — Z853 Personal history of malignant neoplasm of breast: Secondary | ICD-10-CM

## 2019-01-17 NOTE — Progress Notes (Signed)
    [name] 1941/06/28 [id number]        77 y.o.  G2P2L2   RP: Left breast lump noticed x 1 1/2 week  HPI:  Small hard lump on left breast noticed 1 1/2 week ago.  No breast pain currently.  H/O Left Breast Ca with positive LNs in 1992.  S/P Bilateral Mastectomy with reconstruction.  Small nodule on the left chest wall, excised by Dr Dalbert Batman 04/2017, patho benign, fat necrosis with fibrosis.     OB History  Gravida Para Term Preterm AB Living  2 2       2   SAB TAB Ectopic Multiple Live Births               # Outcome Date GA Lbr Len/2nd Weight Sex Delivery Anes PTL Lv  2 Para           1 Para             Past medical history,surgical history, problem list, medications, allergies, family history and social history were all reviewed and documented in the EPIC chart.   Directed ROS with pertinent positives and negatives documented in the history of present illness/assessment and plan.  Exam:  Vitals:   01/17/19 1436  BP: 128/80   General appearance:  Normal  Breast exam:  Bilateral mastectomy with reconstruction.  Rt breast no nodule/mass felt.  Left axilla normal.  Lt breast NT small hard nodule 0.5 x 0.5 cm mobile, at 11-12 O'clock close to nipple at scar line.     Assessment/Plan:  78 y.o. G2P2   1. Left breast lump Probably scar tissue, small hard nodule 0.5 x 0.5 at 11-12 o'clock close to the nipple at the scar line, but given the history of a left breast cancer, will investigate with a left diagnostic mammogram and ultrasound.  2. H/O bilateral mastectomy History of bilateral mastectomy with reconstruction.  3. Personal history of left malignant neoplasm of breast  Counseling on above issues and coordination of care more than 50% for 15 minutes.  Angelica Bruins MD, 2:42 PM 01/17/2019

## 2019-01-17 NOTE — Telephone Encounter (Signed)
-----   Message from [name], MD sent at 01/17/2019  3:01 PM EST ----- Regarding: Left breast Dx mammo/US H/O Left Breast Ca.  S/P Bilateral mastectomy.  Left breast small hard nodule at 11-12 O'clock.  Probable scar tissue.

## 2019-01-17 NOTE — Telephone Encounter (Signed)
Patient scheduled at [facility] 01/23/19 @ 1:30pm, order faxed, left detailed message on cell

## 2019-01-18 ENCOUNTER — Other Ambulatory Visit: Payer: Self-pay | Admitting: Internal Medicine

## 2019-01-18 ENCOUNTER — Encounter: Payer: Self-pay | Admitting: Obstetrics & Gynecology

## 2019-01-18 NOTE — Patient Instructions (Signed)
1. Left breast lump Probably scar tissue, small hard nodule 0.5 x 0.5 at 11-12 o'clock close to the nipple at the scar line, but given the history of a left breast cancer, will investigate with a left diagnostic mammogram and ultrasound.  2. H/O bilateral mastectomy History of bilateral mastectomy with reconstruction.  3. Personal history of left malignant neoplasm of breast  Natha, it was a pleasure seeing you today!

## 2019-01-23 DIAGNOSIS — Z853 Personal history of malignant neoplasm of breast: Secondary | ICD-10-CM | POA: Diagnosis not present

## 2019-01-23 DIAGNOSIS — N6325 Unspecified lump in the left breast, overlapping quadrants: Secondary | ICD-10-CM | POA: Diagnosis not present

## 2019-01-29 ENCOUNTER — Telehealth: Payer: Self-pay

## 2019-01-29 NOTE — Telephone Encounter (Signed)
Copied from [place] [id number]. Topic: General - Other >> Jan 25, 2019  4:44 PM Wynetta Emery, [place] C wrote: Reason for CRM: pt called in to be advised. Pt says that she was told by pharmacy that they only received a month supply of her BP medication metoprolol tartrate (LOPRESSOR) 25 MG tablet. Advised pt that I am showing that a full supply was sent in. Pt would like to know if someone could assist her with this

## 2019-01-29 NOTE — Telephone Encounter (Signed)
LVM letting pt know a 90 day supply was sent on 01/12/79. Asked pt to call back and let me know if needing another refill.

## 2019-02-04 IMAGING — DX DG THORACIC SPINE 3V
3 series · 3 of 3 positions shown · non-contrast
Comparison: CT 02/10/2017.  Chest x-ray 09/02/2016.

CLINICAL DATA: Fall.  Pain.

EXAM:
THORACIC SPINE - 3 VIEWS

[t-spine ap]
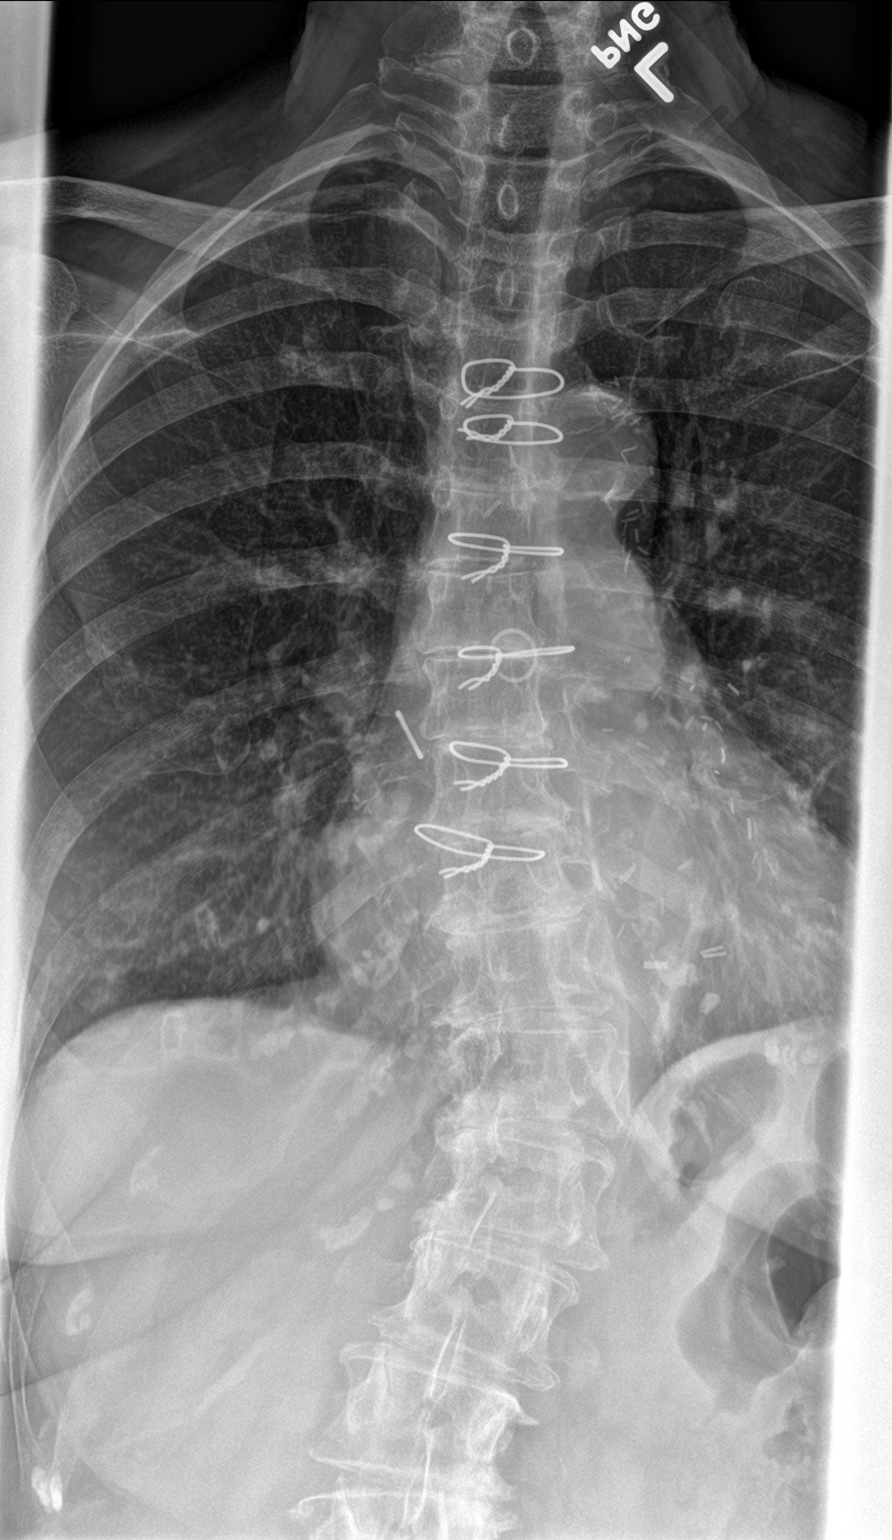

[t-spine lat]
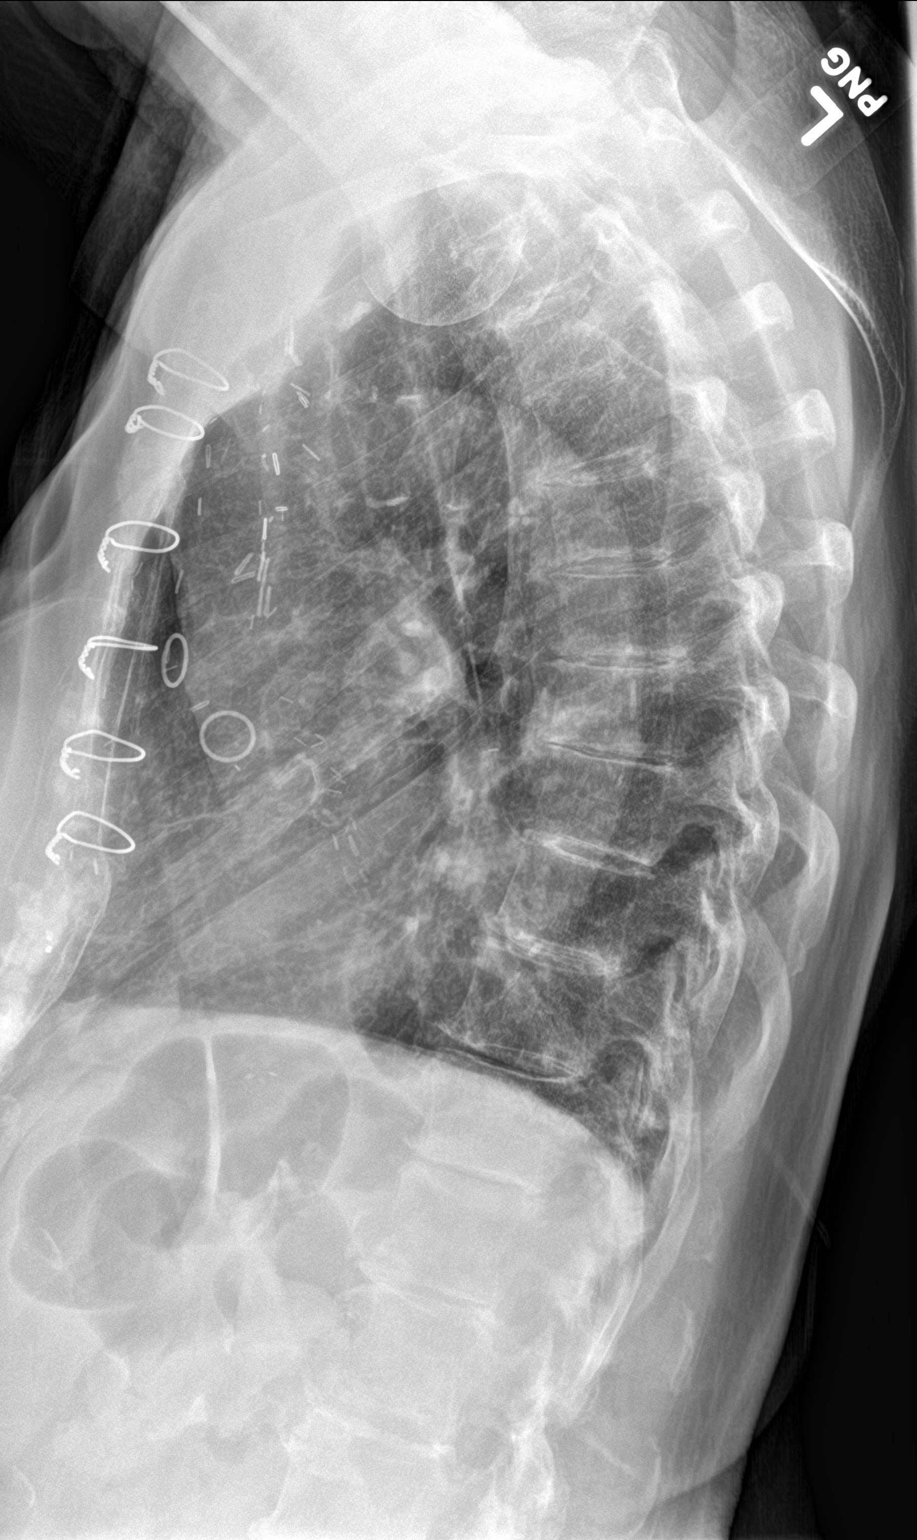

[swimmer]
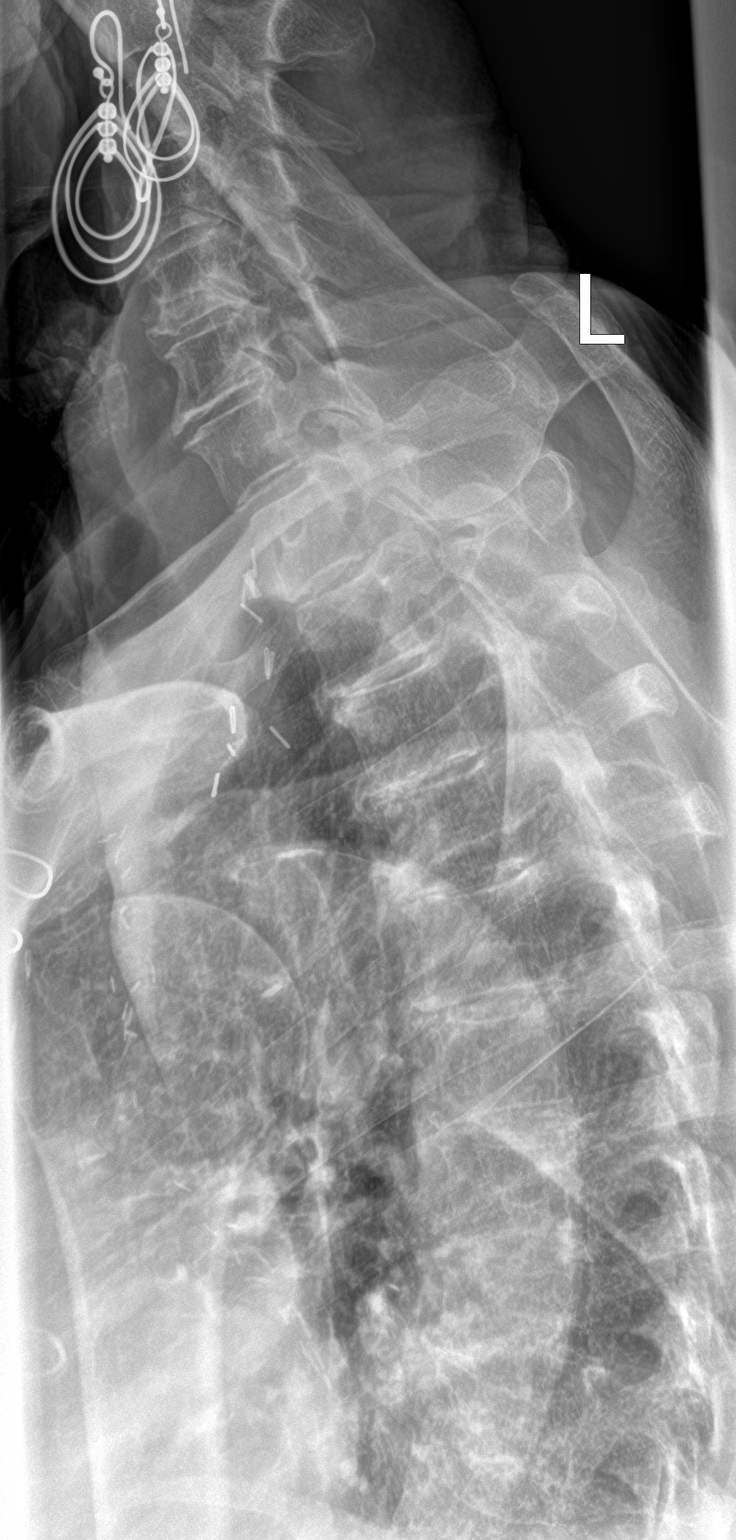

[3 of 3 positions shown; findings below may reference images not displayed]

FINDINGS: Diffuse thoracic spine scoliosis and degenerative change. No acute
bony abnormality identified. No evidence of fracture. Prior CABG.
IMPRESSION: Diffuse thoracic spine scoliosis and degenerative change. No acute
bony abnormality identified .

## 2019-02-28 ENCOUNTER — Telehealth: Payer: Self-pay | Admitting: Cardiovascular Disease

## 2019-02-28 ENCOUNTER — Other Ambulatory Visit: Payer: Self-pay | Admitting: Internal Medicine

## 2019-02-28 NOTE — Telephone Encounter (Signed)
Patient set up for MyChart? NO-PATIENT DECLINED   Is patient using Smartphone/computer/tablet NO  Did audio/video work?N/A  Does patient need telephone visit? YES   Best phone number to use? [phone]  Special Instructions? PATIENT DOES NOT HAVE BP MACHINE, SHE WILL HAVE CURRENT WEIGHT, AND MEDICATION LIST READY,    PATIENT GAVE VERBAL CONSENT FOR VISIT 02/28/2019      Virtual Visit Pre-Appointment Phone Call  Steps For Call:  1. Confirm consent - "In the setting of the current Covid19 crisis, you are scheduled for a (phone or video) visit with your provider on (date) at (time).  Just as we do with many in-office visits, in order for you to participate in this visit, we must obtain consent.  If you'd like, I can send this to your mychart (if signed up) or email for you to review.  Otherwise, I can obtain your verbal consent now.  All virtual visits are billed to your insurance company just like a normal visit would be.  By agreeing to a virtual visit, we'd like you to understand that the technology does not allow for your provider to perform an examination, and thus may limit your provider's ability to fully assess your condition.  Finally, though the technology is pretty good, we cannot assure that it will always work on either your or our end, and in the setting of a video visit, we may have to convert it to a phone-only visit.  In either situation, we cannot ensure that we have a secure connection.  Are you willing to proceed?" STAFF: Did the patient verbally acknowledge consent to telehealth visit? Document YES/NO here: YES   2. Confirm the BEST phone number to call the day of the visit by including in appointment notes  3. Give patient instructions for WebEx/MyChart download to smartphone as below or [email or web address] if video visit (depending on what platform provider is using)  4. Advise patient to be prepared with their blood pressure, heart rate, weight, any heart rhythm  information, their current medicines, and a piece of paper and pen handy for any instructions they may receive the day of their visit  5. Inform patient they will receive a phone call 15 minutes prior to their appointment time (may be from unknown caller ID) so they should be prepared to answer  6. Confirm that appointment type is correct in Epic appointment notes (VIDEO vs PHONE)     TELEPHONE CALL NOTE  TANIAH REINECKE has been deemed a candidate for a follow-up tele-health visit to limit community exposure during the Covid-19 pandemic. I spoke with the patient via phone to ensure availability of phone/video source, confirm preferred email & phone number, and discuss instructions and expectations.  I reminded KENYETTE GUNDY to be prepared with any vital sign and/or heart rhythm information that could potentially be obtained via home monitoring, at the time of her visit. I reminded VAUGHN FRIEZE to expect a phone call at the time of her visit if her visit.  Howie Ill 02/28/2019 2:24 PM        FULL LENGTH CONSENT FOR TELE-HEALTH VISIT   I hereby voluntarily request, consent and authorize [facility] and its employed or contracted physicians, physician assistants, nurse practitioners or other licensed health care professionals (the Practitioner), to provide me with telemedicine health care services (the Services") as deemed necessary by the treating Practitioner. I acknowledge and consent to receive the Services by the Practitioner via telemedicine. I understand that the telemedicine visit  will involve communicating with the Practitioner through live audiovisual communication technology and the disclosure of certain medical information by electronic transmission. I acknowledge that I have been given the opportunity to request an in-person assessment or other available alternative prior to the telemedicine visit and am voluntarily participating in the telemedicine visit.  I understand  that I have the right to withhold or withdraw my consent to the use of telemedicine in the course of my care at any time, without affecting my right to future care or treatment, and that the Practitioner or I may terminate the telemedicine visit at any time. I understand that I have the right to inspect all information obtained and/or recorded in the course of the telemedicine visit and may receive copies of available information for a reasonable fee.  I understand that some of the potential risks of receiving the Services via telemedicine include:   Delay or interruption in medical evaluation due to technological equipment failure or disruption;  Information transmitted may not be sufficient (e.g. poor resolution of images) to allow for appropriate medical decision making by the Practitioner; and/or   In rare instances, security protocols could fail, causing a breach of personal health information.  Furthermore, I acknowledge that it is my responsibility to provide information about my medical history, conditions and care that is complete and accurate to the best of my ability. I acknowledge that Practitioner's advice, recommendations, and/or decision may be based on factors not within their control, such as incomplete or inaccurate data provided by me or distortions of diagnostic images or specimens that may result from electronic transmissions. I understand that the practice of medicine is not an exact science and that Practitioner makes no warranties or guarantees regarding treatment outcomes. I acknowledge that I will receive a copy of this consent concurrently upon execution via email to the email address I last provided but may also request a printed copy by calling the office of [place].    I understand that my insurance will be billed for this visit.   I have read or had this consent read to me.  I understand the contents of this consent, which adequately explains the benefits and risks  of the Services being provided via telemedicine.   I have been provided ample opportunity to ask questions regarding this consent and the Services and have had my questions answered to my satisfaction.  I give my informed consent for the services to be provided through the use of telemedicine in my medical care  By participating in this telemedicine visit I agree to the above.

## 2019-03-01 NOTE — Telephone Encounter (Signed)
[place] Controlled Database Checked Last filled: 12/08/18 # 30 LOV w/you: 01/12/19  Next appt w/you: 07/10/19

## 2019-03-05 ENCOUNTER — Other Ambulatory Visit: Payer: Self-pay

## 2019-03-05 ENCOUNTER — Telehealth (INDEPENDENT_AMBULATORY_CARE_PROVIDER_SITE_OTHER): Payer: Medicare Other | Admitting: Cardiovascular Disease

## 2019-03-05 ENCOUNTER — Encounter: Payer: Self-pay | Admitting: Cardiovascular Disease

## 2019-03-05 VITALS — Ht 65.5 in | Wt 132.0 lb

## 2019-03-05 DIAGNOSIS — E782 Mixed hyperlipidemia: Secondary | ICD-10-CM

## 2019-03-05 DIAGNOSIS — I2510 Atherosclerotic heart disease of native coronary artery without angina pectoris: Secondary | ICD-10-CM | POA: Diagnosis not present

## 2019-03-05 DIAGNOSIS — I350 Nonrheumatic aortic (valve) stenosis: Secondary | ICD-10-CM

## 2019-03-05 DIAGNOSIS — I5032 Chronic diastolic (congestive) heart failure: Secondary | ICD-10-CM

## 2019-03-05 DIAGNOSIS — I10 Essential (primary) hypertension: Secondary | ICD-10-CM

## 2019-03-05 NOTE — Progress Notes (Signed)
Virtual Visit via Telephone Note   This visit type was conducted due to national recommendations for restrictions regarding the COVID-19 Pandemic (e.g. social distancing) in an effort to limit this patient's exposure and mitigate transmission in our community.  Due to her co-morbid illnesses, this patient is at least at moderate risk for complications without adequate follow up.  This format is felt to be most appropriate for this patient at this time.  The patient did not have access to video technology/had technical difficulties with video requiring transitioning to audio format only (telephone).  All issues noted in this document were discussed and addressed.  No physical exam could be performed with this format.  Please refer to the patient's chart for her  consent to telehealth for [facility].   Evaluation Performed:  Follow-up visit  Date:  03/05/2019   ID:  Aryn, Safran April 18, 1941, MRN [id number]  Patient Location: Home Provider Location: Home  PCP:  [name], MD  Cardiologist:  [name], MD  Electrophysiologist:  None   Chief Complaint:  CAD follow-up  History of Present Illness:    RAJVI ARMENTOR is a 78 y.o. female with history of coronary artery disease status post CABG in 2007.  The patient is also followed for mild to moderate aortic stenosis.  She underwent cardiac catheterization in 2011 when she developed recurrent symptoms of chest discomfort, but this demonstrated wide patency of all of her bypass grafts.  The patient does not have symptoms concerning for COVID-19 infection (fever, chills, cough, or new shortness of breath).   The patient is doing well at present.  She is interviewed today over the telephone because of the COVID-19 pandemic.  Video conferencing was not possible because of lack of appropriate A/V equipment by the patient.  The patient denies symptoms of chest pain, chest pressure, shortness of breath, heart palpitations, leg swelling,  orthopnea, or PND.  She has not been doing too much, but has no symptoms with her current level of physical exertion.  She has been practicing social distancing and overall feels that she is doing just fine.   Past Medical History:  Diagnosis Date   ALLERGIC RHINITIS    Allergy    SEASONAL   Anxiety    Aortic stenosis, moderate    Blood transfusion without reported diagnosis    BREAST CANCER 1991   left, 1991; B mastectomy   Cataract    BILATERAL-REMOVED   Coronary artery disease    s/p CABGx5 2007 //Nuclear stress test 10/18: EF 76, no ischemia or infarction, low risk   Depression    DIVERTICULOSIS    DVT    EROSIVE GASTRITIS    GERD    Heart murmur    History of DVT (deep vein thrombosis)    HYPERLIPIDEMIA    Hypertension    HYPOTHYROIDISM    Occlusion and stenosis of carotid artery without mention of cerebral infarction    Osteopenia    OSTEOPOROSIS    RHINOSINUSITIS, RECURRENT    Past Surgical History:  Procedure Laterality Date   APPENDECTOMY     COLONOSCOPY     CORONARY ARTERY BYPASS GRAFT     5        2007   MASTECTOMY     left with reconstruction and lymph node excision  1992   MASTECTOMY     right with reconstruction   REVISION RECONSTRUCTED BREAST Left 02/2014   willard (plastics in [place])   right ankle arthroscopy  TONSILLECTOMY AND ADENOIDECTOMY     UPPER GASTROINTESTINAL ENDOSCOPY     VAGINAL HYSTERECTOMY     ovaries not excised     Current Meds  Medication Sig   aspirin 81 MG tablet Take 81 mg by mouth daily.     atorvastatin (LIPITOR) 20 MG tablet Take 1 tablet (20 mg total) by mouth daily.   Calcium Carbonate (CALTRATE 600 PO) Take 1 tablet by mouth 2 (two) times daily.     chlorpheniramine (CHLOR-TRIMETON) 4 MG tablet Take 4 mg by mouth daily as needed for allergies.   Coenzyme Q10 (CO Q-10 PO) Take 50 mg by mouth daily.     fluticasone (FLONASE) 50 MCG/ACT nasal spray INSTILL 1 TO 2 SPRAYS IN EACH NOSTRIL  ONCE OR TWICE DAILY   levothyroxine (SYNTHROID, LEVOTHROID) 75 MCG tablet TAKE 1 TABLET BY MOUTH EVERY DAY BEFORE BREAKFAST.   meloxicam (MOBIC) 15 MG tablet Take 1 tablet (15 mg total) by mouth daily.   metoprolol tartrate (LOPRESSOR) 25 MG tablet Take 1 tablet (25 mg total) by mouth 2 (two) times daily.   multivitamin (THERAGRAN) per tablet Take 1 tablet by mouth daily.     PROPYLENE GLYCOL OP Place 1 drop into both eyes 4 (four) times daily.   senna-docusate (SENOKOT-S) 8.6-50 MG per tablet Take 1 tablet by mouth daily.     zolpidem (AMBIEN) 10 MG tablet TAKE 1 TABLET BY MOUTH DAILY AT BEDTIME     Allergies:   Clarithromycin; Codeine; Levofloxacin; Morphine; Naproxen; Penicillins; and Pneumococcal vaccines   Social History   Tobacco Use   Smoking status: Never Smoker   Smokeless tobacco: Never Used  Substance Use Topics   Alcohol use: No    Alcohol/week: 0.0 standard drinks   Drug use: No     Family Hx: The patient's family history includes Breast cancer in her mother; Colon cancer in her sister and sister; Emphysema in her father; Lung disease in her mother. There is no history of Esophageal cancer, Rectal cancer, or Stomach cancer.  ROS:   Please see the history of present illness.    All other systems reviewed and are negative.   Prior CV studies:   The following studies were reviewed today:  Echo 07/31/2018: Study Conclusions  - Left ventricle: The cavity size was normal. Systolic function was   normal. The estimated ejection fraction was in the range of 55%   to 60%. Wall motion was normal; there were no regional wall   motion abnormalities. Doppler parameters are consistent with a   reversible restrictive pattern, indicative of decreased left   ventricular diastolic compliance and/or increased left atrial   pressure (grade 3 diastolic dysfunction). - Aortic valve: There was mild to moderate stenosis. There was mild   regurgitation. Mean gradient (S):  17 mm Hg. Peak gradient (S): 29   mm Hg. Valve area (VTI): 1.11 cm^2. - Mitral valve: Calcified annulus. Mildly thickened leaflets .   There was mild regurgitation. - Left atrium: The atrium was moderately dilated. - Right ventricle: Systolic function was normal. - Atrial septum: No defect or patent foramen ovale was identified. - Tricuspid valve: There was mild regurgitation. - Pulmonic valve: There was moderate regurgitation. - Pulmonary arteries: Systolic pressure was mildly to moderately   increased.  Impressions:  - Normal LV EF, grade 3 diastolic dysfunction, mild to moderate   aortic stenosis.  Labs/Other Tests and Data Reviewed:    EKG:  An ECG dated 02/06/2018 was personally reviewed today and demonstrated:  NSR, age-indeterminate anterior infarct, otherwise no significant changes  Recent Labs: 07/21/2018: Hemoglobin 13.3; Platelets 223.0; TSH 0.95 07/28/2018: ALT 21; BUN 19; Creatinine, Ser 0.71; Potassium 4.4; Sodium 140   Recent Lipid Panel Lab Results  Component Value Date/Time   CHOL 134 07/28/2018 11:53 AM   TRIG 56 07/28/2018 11:53 AM   HDL 52 07/28/2018 11:53 AM   CHOLHDL 2.6 07/28/2018 11:53 AM   CHOLHDL 2.5 04/05/2016 10:59 AM   LDLCALC 71 07/28/2018 11:53 AM    Wt Readings from Last 3 Encounters:  03/05/19 132 lb (59.9 kg)  01/12/19 138 lb (62.6 kg)  07/31/18 139 lb 8 oz (63.3 kg)     Objective:    Vital Signs:  Ht 5' 5.5" (1.664 m)    Wt 132 lb (59.9 kg)    BMI 21.63 kg/m    VITAL SIGNS:  reviewed  The patient is speaking comfortably with no shortness of breath or distress. Further aspects of the physical exam not performed today as this is a telephone meeting. ASSESSMENT & PLAN:    1. Coronary artery disease, native vessel, without angina.  The patient appears stable on her current medical regimen.  No changes are made today.  I did review her medications as outlined above. 2. Moderate aortic stenosis: No cardinal symptoms at this point.  I  reviewed her most recent echocardiogram from September 2019.  I would like her to return for follow-up in 6 months with an echocardiogram at that time. 3. Mixed hyperlipidemia: Treated with a statin drug.  Labs from last year are reviewed.  Will arrange repeat laboratory testing when she returns for follow-up in 6 months. 4. Carotid stenosis without history of stroke or TIA: The patient's most recent carotid duplex scan is reviewed from 2019 and this demonstrates less than 40% carotid stenosis bilaterally.  Clinical follow-up and medical therapy recommended.  Overall I think the patient is doing very well.  I did not make any changes in her medical regimen today.  I would like her to return for follow-up in 6 months with an echocardiogram for aortic stenosis surveillance and lab work to include a CBC, complete metabolic panel, TSH, and lipid panel.  COVID-19 Education: The signs and symptoms of COVID-19 were discussed with the patient and how to seek care for testing (follow up with PCP or arrange E-visit).  The importance of social distancing was discussed today.  Time:   Today, I have spent 15 minutes with the patient with telehealth technology discussing the above problems.     Medication Adjustments/Labs and Tests Ordered: Current medicines are reviewed at length with the patient today.  Concerns regarding medicines are outlined above.   Tests Ordered: No orders of the defined types were placed in this encounter.   Medication Changes: No orders of the defined types were placed in this encounter.   Disposition:  Follow up in 6 month(s)  Signed, [name], MD  03/05/2019 2:33 PM    [place]

## 2019-03-21 ENCOUNTER — Other Ambulatory Visit: Payer: Self-pay | Admitting: Internal Medicine

## 2019-04-18 ENCOUNTER — Other Ambulatory Visit: Payer: Self-pay

## 2019-04-19 ENCOUNTER — Encounter: Payer: Self-pay | Admitting: Obstetrics & Gynecology

## 2019-04-19 ENCOUNTER — Ambulatory Visit (INDEPENDENT_AMBULATORY_CARE_PROVIDER_SITE_OTHER): Payer: Medicare Other | Admitting: Obstetrics & Gynecology

## 2019-04-19 VITALS — BP 140/80 | Ht 63.0 in | Wt 133.6 lb

## 2019-04-19 DIAGNOSIS — Z780 Asymptomatic menopausal state: Secondary | ICD-10-CM | POA: Diagnosis not present

## 2019-04-19 DIAGNOSIS — M810 Age-related osteoporosis without current pathological fracture: Secondary | ICD-10-CM

## 2019-04-19 DIAGNOSIS — Z01419 Encounter for gynecological examination (general) (routine) without abnormal findings: Secondary | ICD-10-CM

## 2019-04-19 DIAGNOSIS — R351 Nocturia: Secondary | ICD-10-CM | POA: Diagnosis not present

## 2019-04-19 DIAGNOSIS — Z853 Personal history of malignant neoplasm of breast: Secondary | ICD-10-CM | POA: Diagnosis not present

## 2019-04-19 DIAGNOSIS — Z90710 Acquired absence of both cervix and uterus: Secondary | ICD-10-CM

## 2019-04-19 NOTE — Progress Notes (Signed)
[name] 1941/09/02 [id number]   History:    78 y.o. G2P2L2 Married.  Has grand-children  RP:  Established patient presenting for annual gyn exam   HPI:  S/P Total Hysterectomy.  No pelvic pain.  Normal vaginal secretions.  Not sexually active.  H/O Left Breast Ca with positive LNs in 1992. S/P Bilateral Mastectomy with reconstruction.  Small nodule on the left chest wall, excised by Dr Dalbert Batman 04/2017, patho benign, fat necrosis with fibrosis.  Felt a small nodule in the same area in 01/2019.  Will obtain the Lt Dx mammo/US from [place].  Urine and bowel movements normal. Body mass index 23.67.  Not walking as much recently.  Health labs with family physician.  Past medical history,surgical history, family history and social history were all reviewed and documented in the EPIC chart.  Gynecologic History No LMP recorded. Patient has had a hysterectomy. Contraception: status post hysterectomy Last Pap: 01/2018. Results were: Negative Last mammogram: Will obtain from [facility] Bone Density: 03/2017 Osteoporosis 1/3 Left Radius T-Score -2.7 Colonoscopy: 2018  Obstetric History OB History  Gravida Para Term Preterm AB Living  2 2       2   SAB TAB Ectopic Multiple Live Births               # Outcome Date GA Lbr Len/2nd Weight Sex Delivery Anes PTL Lv  2 Para           1 Para              ROS: A ROS was performed and pertinent positives and negatives are included in the history.  GENERAL: No fevers or chills. HEENT: No change in vision, no earache, sore throat or sinus congestion. NECK: No pain or stiffness. CARDIOVASCULAR: No chest pain or pressure. No palpitations. PULMONARY: No shortness of breath, cough or wheeze. GASTROINTESTINAL: No abdominal pain, nausea, vomiting or diarrhea, melena or bright red blood per rectum. GENITOURINARY: No urinary frequency, urgency, hesitancy or dysuria. MUSCULOSKELETAL: No joint or muscle pain, no back pain, no recent trauma. DERMATOLOGIC: No rash, no  itching, no lesions. ENDOCRINE: No polyuria, polydipsia, no heat or cold intolerance. No recent change in weight. HEMATOLOGICAL: No anemia or easy bruising or bleeding. NEUROLOGIC: No headache, seizures, numbness, tingling or weakness. PSYCHIATRIC: No depression, no loss of interest in normal activity or change in sleep pattern.     Exam:   BP 140/80   Ht 5\' 3"  (1.6 m)   Wt 133 lb 9.6 oz (60.6 kg)   BMI 23.67 kg/m   Body mass index is 23.67 kg/m.  General appearance : Well developed well nourished female. No acute distress HEENT: Eyes: no retinal hemorrhage or exudates,  Neck supple, trachea midline, no carotid bruits, no thyroidmegaly Lungs: Clear to auscultation, no rhonchi or wheezes, or rib retractions  Heart: Regular rate and rhythm, no murmurs or gallops Breast:Examined in sitting and supine position.  Bilateral mastectomy with reconstruction.  Scarring present, but no new palpable nodules/masses or tenderness.  No axillary or supraclavicular lymphadenopathy Abdomen: no palpable masses or tenderness, no rebound or guarding Extremities: no edema or skin discoloration or tenderness  Pelvic: Vulva: Normal             Vagina: No gross lesions or discharge  Cervix/Uterus absent  Adnexa  Without masses or tenderness  Anus: Normal   Assessment/Plan:  78 y.o. female for annual exam   1. Well female exam with routine gynecological exam Gynecologic exam status post total  hysterectomy and menopause.  Pap test March 2019 was negative, no indication to repeat a Pap test.  Breast exam status post bilateral mastectomy with reconstruction.  Good body mass index at 23.67.  Encouraged to walk more frequently.  Health labs with family physician.  Colonoscopy 2018.  2. S/P total hysterectomy  3. Postmenopause Well on no hormone replacement therapy.  4. Age-related osteoporosis without current pathological fracture Osteoporosis with a T score of -2.7 at the third left radius per bone  density in May 2018.  Per patient, she will repeat a bone density, through her family physician, at [facility].  Recommend to continue with vitamin D supplements and calcium intake of 1200 mg daily.  Recommend regular weightbearing physical activities.  5. Personal history of malignant neoplasm of breast Status post bilateral mastectomy with reconstruction.  6. Nocturia Seen by urology previously.  Recommend to stop all caffeine products including her tea at dinnertime.  If no improvement, will reschedule a visit with urology.  Counseling on above issues and coordination of care more than 50% for 10 minutes.  [name] MD, 2:17 PM 04/19/2019

## 2019-04-19 NOTE — Patient Instructions (Signed)
1. Well female exam with routine gynecological exam Gynecologic exam status post total hysterectomy and menopause.  Pap test March 2019 was negative, no indication to repeat a Pap test.  Breast exam status post bilateral mastectomy with reconstruction.  Good body mass index at 23.67.  Encouraged to walk more frequently.  Health labs with family physician.  Colonoscopy 2018.  2. S/P total hysterectomy  3. Postmenopause Well on no hormone replacement therapy.  4. Age-related osteoporosis without current pathological fracture Osteoporosis with a T score of -2.7 at the third left radius per bone density in May 2018.  Per patient, she will repeat a bone density, through her family physician, at [facility].  Recommend to continue with vitamin D supplements and calcium intake of 1200 mg daily.  Recommend regular weightbearing physical activities.  5. Personal history of malignant neoplasm of breast Status post bilateral mastectomy with reconstruction.  6. Nocturia Seen by urology previously.  Recommend to stop all caffeine products including her tea at dinnertime.  If no improvement, will reschedule a visit with urology.  Dasha, it was a pleasure seeing you today!

## 2019-04-20 ENCOUNTER — Telehealth: Payer: Self-pay | Admitting: Internal Medicine

## 2019-04-20 DIAGNOSIS — M1990 Unspecified osteoarthritis, unspecified site: Secondary | ICD-10-CM

## 2019-04-20 MED ORDER — MELOXICAM 15 MG PO TABS: 15.0000 mg | ORAL_TABLET | Freq: Every day | ORAL | 0 refills | Status: DC

## 2019-04-20 NOTE — Telephone Encounter (Signed)
Copied from [place] [phone]. Topic: General - Other >> Apr 20, 2019 10:34 AM [name] wrote: Reason for CRM: Pt called and is requesting to know if PCP would like to deal with bone density testing or if she would like pts OBGYN to take care of it. Pt states if PCP does decide to handle the testing that when the appt is set at [facility] she would like it to be in the afternoon. Pt is also requesting [facility] refill on her meloxicam (MOBIC) 15 MG tablet. Please advise.  [id number] - [place], [place] - [address] AT [address] [place] [address] Phone: [phone] Fax: [phone] Not [facility] 24 hour pharmacy; exact hours not known.

## 2019-04-20 NOTE — Telephone Encounter (Signed)
Meloxicam sent to pharmacy.  DEXA scan was ordered a few months ago-she can call [facility] directly to schedule an appointment.  They have the referral.

## 2019-04-23 NOTE — Telephone Encounter (Signed)
Pt aware of response.  

## 2019-04-25 ENCOUNTER — Encounter: Payer: Self-pay | Admitting: Internal Medicine

## 2019-04-25 DIAGNOSIS — Z90710 Acquired absence of both cervix and uterus: Secondary | ICD-10-CM | POA: Diagnosis not present

## 2019-04-25 DIAGNOSIS — M810 Age-related osteoporosis without current pathological fracture: Secondary | ICD-10-CM | POA: Diagnosis not present

## 2019-04-25 DIAGNOSIS — Z853 Personal history of malignant neoplasm of breast: Secondary | ICD-10-CM | POA: Diagnosis not present

## 2019-04-28 ENCOUNTER — Telehealth: Payer: Self-pay | Admitting: Internal Medicine

## 2019-04-28 NOTE — Telephone Encounter (Signed)
Her bone density in her spine and hips may be a little better, but it is slightly worse in her left arm.  She needs to continue with the calcium and vitamin d, try to walk regularly and to some weights for her arms.  I would not recommend medication at this time.  Repeat dexa in 2 years.

## 2019-04-30 NOTE — Telephone Encounter (Signed)
Pt aware of results 

## 2019-05-24 ENCOUNTER — Other Ambulatory Visit: Payer: Self-pay | Admitting: Internal Medicine

## 2019-05-25 ENCOUNTER — Other Ambulatory Visit: Payer: Self-pay

## 2019-05-25 DIAGNOSIS — I350 Nonrheumatic aortic (valve) stenosis: Secondary | ICD-10-CM

## 2019-05-25 NOTE — Telephone Encounter (Signed)
Last OV 01/12/19 Next OV 07/10/19 Last RF 03/01/19

## 2019-06-30 ENCOUNTER — Other Ambulatory Visit: Payer: Self-pay | Admitting: Internal Medicine

## 2019-06-30 DIAGNOSIS — M1990 Unspecified osteoarthritis, unspecified site: Secondary | ICD-10-CM

## 2019-07-09 ENCOUNTER — Other Ambulatory Visit: Payer: Self-pay | Admitting: Internal Medicine

## 2019-07-10 ENCOUNTER — Ambulatory Visit: Payer: Medicare Other | Admitting: Internal Medicine

## 2019-07-13 ENCOUNTER — Other Ambulatory Visit: Payer: Self-pay | Admitting: Internal Medicine

## 2019-07-24 NOTE — Progress Notes (Signed)
Subjective:    Patient ID: [name], female    DOB: 02/23/1941, 78 y.o.   MRN: [id number]  HPI The patient is here for follow up.  She is exercising regularly - walks 2 miles a day.    Hyperlipidemia: She is taking her medication daily. She is compliant with a low fat/cholesterol diet. She denies myalgias.   Hypertension: She is taking her medication daily. She is compliant with a low sodium diet.  She denies chest pain, palpitations, edema, shortness of breath and regular headaches.  Hypothyroidism:  She is taking her medication daily.  She denies any recent changes in energy or weight that are unexplained.   Insomnia:  She is taking ambien nightly.  She only takes approximately a quarter of a pill.  Her sleep is good.    Osteoporosis:  She is walking regularly.  Her dexa is up to date.   She is taking calcium and vitamin d daily.    Medications and allergies reviewed with patient and updated if appropriate.  Patient Active Problem List   Diagnosis Date Noted  . Breast pain, left 01/13/2019  . Poor balance 05/10/2018  . Left ventricular diastolic dysfunction [id number]  . Essential hypertension 07/27/2017  . Fall 03/02/2017  . Osteoarthritis 02/16/2017  . Urinary incontinence 02/16/2017  . Chest pain 09/01/2016  . Chronic venous insufficiency 08/20/2016  . Xiphoid pain 03/05/2016  . Insomnia 01/16/2016  . Anxiety and depression 12/24/2015  . Seasonal and perennial allergic rhinitis 11/24/2015  . Asthma, mild intermittent, well-controlled 11/24/2015  . Carotid artery disease ([place]) 06/29/2011  . Hypothyroidism 11/11/2010  . Hyperlipidemia 11/11/2010  . Senile osteoporosis 11/11/2010  . RHINOSINUSITIS, RECURRENT 10/29/2008  . Personal history of malignant neoplasm of breast 03/27/2008  . GERD 03/27/2008  . DIVERTICULOSIS 03/27/2008  . CAD (coronary artery disease) 08/23/2007  . Aortic stenosis, moderate 08/23/2007  . DVT 08/23/2007    Current Outpatient  Medications on File Prior to Visit  Medication Sig Dispense Refill  . aspirin 81 MG tablet Take 81 mg by mouth daily.      Marland Kitchen atorvastatin (LIPITOR) 20 MG tablet TAKE 1 TABLET BY MOUTH DAILY. NEED OFFICE VISIT FOR MORE REFILLS 30 tablet 0  . Calcium Carbonate (CALTRATE 600 PO) Take 1 tablet by mouth 2 (two) times daily.      . chlorpheniramine (CHLOR-TRIMETON) 4 MG tablet Take 4 mg by mouth daily as needed for allergies.    . Coenzyme Q10 (CO Q-10 PO) Take 50 mg by mouth daily.      . fluticasone (FLONASE) 50 MCG/ACT nasal spray INSTILL 1 TO 2 SPRAYS IN EACH NOSTRIL ONCE OR TWICE DAILY 16 g 0  . levothyroxine (SYNTHROID) 75 MCG tablet TAKE 1 TABLET BY MOUTH EVERY DAY BEFORE BREAKFAST. 90 tablet 1  . meloxicam (MOBIC) 15 MG tablet TAKE 1 TABLET BY MOUTH EVERY DAY 90 tablet 0  . metoprolol tartrate (LOPRESSOR) 25 MG tablet Take 1 tablet (25 mg total) by mouth 2 (two) times daily. Need office visit for more refills. 60 tablet 0  . multivitamin (THERAGRAN) per tablet Take 1 tablet by mouth daily.      Marland Kitchen PROPYLENE GLYCOL OP Place 1 drop into both eyes 4 (four) times daily.    Marland Kitchen senna-docusate (SENOKOT-S) 8.6-50 MG per tablet Take 1 tablet by mouth daily.      Marland Kitchen VITAMIN D, CHOLECALCIFEROL, PO Take by mouth.    . zolpidem (AMBIEN) 10 MG tablet TAKE 1 TABLET BY MOUTH EVERYDAY  AT BEDTIME 30 tablet 0   No current facility-administered medications on file prior to visit.     Past Medical History:  Diagnosis Date  . ALLERGIC RHINITIS   . Allergy    SEASONAL  . Anxiety   . Aortic stenosis, moderate   . Blood transfusion without reported diagnosis   . BREAST CANCER 1991   left, 1991; B mastectomy  . Cataract    BILATERAL-REMOVED  . Coronary artery disease    s/p CABGx5 2007 //Nuclear stress test 10/18: EF 76, no ischemia or infarction, low risk  . Depression   . DIVERTICULOSIS   . DVT   . EROSIVE GASTRITIS   . GERD   . Heart murmur   . History of DVT (deep vein thrombosis)   .  HYPERLIPIDEMIA   . Hypertension   . HYPOTHYROIDISM   . Occlusion and stenosis of carotid artery without mention of cerebral infarction   . Osteopenia   . OSTEOPOROSIS   . RHINOSINUSITIS, RECURRENT     Past Surgical History:  Procedure Laterality Date  . APPENDECTOMY    . COLONOSCOPY    . CORONARY ARTERY BYPASS GRAFT     5        2007  . MASTECTOMY     left with reconstruction and lymph node excision  1992  . MASTECTOMY     right with reconstruction  . REVISION RECONSTRUCTED BREAST Left 02/2014   willard (plastics in [place])  . right ankle arthroscopy    . TONSILLECTOMY AND ADENOIDECTOMY    . UPPER GASTROINTESTINAL ENDOSCOPY    . VAGINAL HYSTERECTOMY     ovaries not excised    Social History   Socioeconomic History  . Marital status: Divorced    Spouse name: Not on file  . Number of children: Not on file  . Years of education: Not on file  . Highest education level: Not on file  Occupational History  . Not on file  Social Needs  . Financial resource strain: Not hard at all  . Food insecurity    Worry: Never true    Inability: Never true  . Transportation needs    Medical: No    Non-medical: No  Tobacco Use  . Smoking status: Never Smoker  . Smokeless tobacco: Never Used  Substance and Sexual Activity  . Alcohol use: No    Alcohol/week: 0.0 standard drinks  . Drug use: No  . Sexual activity: Not Currently  Lifestyle  . Physical activity    Days per week: 5 days    Minutes per session: 50 min  . Stress: Not at all  Relationships  . Social connections    Talks on phone: More than three times a week    Gets together: More than three times a week    Attends religious service: More than 4 times per year    Active member of club or organization: Yes    Attends meetings of clubs or organizations: More than 4 times per year    Relationship status: Divorced  Other Topics Concern  . 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 Colon cancer Sister   . Colon cancer Sister   . Breast cancer Mother   . Lung disease Mother   . Emphysema Father   . Esophageal cancer Neg  Hx   . Rectal cancer Neg Hx   . Stomach cancer Neg Hx     Review of Systems  Constitutional: Negative for chills, fatigue and fever.  Respiratory: Negative for cough, shortness of breath and wheezing.   Cardiovascular: Negative for chest pain, palpitations and leg swelling.  Neurological: Negative for light-headedness and headaches.       Objective:   Vitals:   07/25/19 1344  BP: (!) 152/72  Pulse: (!) 52  Resp: 16  Temp: 98 F (36.7 C)  SpO2: 97%   BP Readings from Last 3 Encounters:  07/25/19 (!) 152/72  04/19/19 140/80  01/17/19 128/80   Wt Readings from Last 3 Encounters:  07/25/19 133 lb (60.3 kg)  04/19/19 133 lb 9.6 oz (60.6 kg)  03/05/19 132 lb (59.9 kg)   Body mass index is 23.56 kg/m.   Physical Exam    Constitutional: Appears well-developed and well-nourished. No distress.  HENT:  Head: Normocephalic and atraumatic.  Neck: Neck supple. No tracheal deviation present. No thyromegaly present.  No cervical lymphadenopathy Cardiovascular: Normal rate, regular rhythm and normal heart sounds.   2/6 systolic  murmur heard. No carotid bruit .  No edema Pulmonary/Chest: Effort normal and breath sounds normal. No respiratory distress. No has no wheezes. No rales.  Abdomen: soft, NT, ND Skin: Skin is warm and dry. Not diaphoretic.  Psychiatric: Normal mood and affect. Behavior is normal.      Assessment & Plan:    See Problem List for Assessment and Plan of chronic medical problems.

## 2019-07-24 NOTE — Progress Notes (Deleted)
Subjective:   [name] is a 78 y.o. female who presents for Medicare Annual (Subsequent) preventive examination.  Review of Systems:  ***       Objective:     Vitals: There were no vitals taken for this visit.  There is no height or weight on file to calculate BMI.  Advanced Directives 07/19/2018 07/13/2017 03/30/2017 08/20/2016 01/17/2016  Does Patient Have a Medical Advance Directive? Yes Yes Yes Yes No  Type of Paramedic of [place];Living will [place];Living will [place];Living will [place];Living will -  Copy of [place] in Chart? No - copy requested No - copy requested - - -  Would patient like information on creating a medical advance directive? - - - - No - patient declined information    Tobacco Social History   Tobacco Use  Smoking Status Never Smoker  Smokeless Tobacco Never Used     Counseling given: Not Answered   Clinical Intake:                       Past Medical History:  Diagnosis Date  . ALLERGIC RHINITIS   . Allergy    SEASONAL  . Anxiety   . Aortic stenosis, moderate   . Blood transfusion without reported diagnosis   . BREAST CANCER 1991   left, 1991; B mastectomy  . Cataract    BILATERAL-REMOVED  . Coronary artery disease    s/p CABGx5 2007 //Nuclear stress test 10/18: EF 76, no ischemia or infarction, low risk  . Depression   . DIVERTICULOSIS   . DVT   . EROSIVE GASTRITIS   . GERD   . Heart murmur   . History of DVT (deep vein thrombosis)   . HYPERLIPIDEMIA   . Hypertension   . HYPOTHYROIDISM   . Occlusion and stenosis of carotid artery without mention of cerebral infarction   . Osteopenia   . OSTEOPOROSIS   . RHINOSINUSITIS, RECURRENT    Past Surgical History:  Procedure Laterality Date  . APPENDECTOMY    . COLONOSCOPY    . CORONARY ARTERY BYPASS GRAFT     5        2007  . MASTECTOMY     left with  reconstruction and lymph node excision  1992  . MASTECTOMY     right with reconstruction  . REVISION RECONSTRUCTED BREAST Left 02/2014   willard (plastics in [place])  . right ankle arthroscopy    . TONSILLECTOMY AND ADENOIDECTOMY    . UPPER GASTROINTESTINAL ENDOSCOPY    . VAGINAL HYSTERECTOMY     ovaries not excised   Family History  Problem Relation Age of Onset  . Colon cancer Sister   . Colon cancer Sister   . Breast cancer Mother   . Lung disease Mother   . Emphysema Father   . Esophageal cancer Neg Hx   . Rectal cancer Neg Hx   . Stomach cancer Neg Hx    Social History   Socioeconomic History  . Marital status: Divorced    Spouse name: Not on file  . Number of children: Not on file  . Years of education: Not on file  . Highest education level: Not on file  Occupational History  . Not on file  Social Needs  . Financial resource strain: Not hard at all  . Food insecurity    Worry: Never true    Inability: Never  true  . Transportation needs    Medical: No    Non-medical: No  Tobacco Use  . Smoking status: Never Smoker  . Smokeless tobacco: Never Used  Substance and Sexual Activity  . Alcohol use: No    Alcohol/week: 0.0 standard drinks  . Drug use: No  . Sexual activity: Not Currently  Lifestyle  . Physical activity    Days per week: 5 days    Minutes per session: 50 min  . Stress: Not at all  Relationships  . Social connections    Talks on phone: More than three times a week    Gets together: More than three times a week    Attends religious service: More than 4 times per year    Active member of club or organization: Yes    Attends meetings of clubs or organizations: More than 4 times per year    Relationship status: Divorced  Other Topics Concern  . 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 aspirin 81 MG tablet Take 81 mg by mouth daily.    Marland Kitchen atorvastatin (LIPITOR) 20 MG tablet TAKE 1 TABLET BY MOUTH DAILY. NEED OFFICE VISIT FOR MORE REFILLS  . Calcium Carbonate (CALTRATE 600 PO) Take 1 tablet by mouth 2 (two) times daily.    . chlorpheniramine (CHLOR-TRIMETON) 4 MG tablet Take 4 mg by mouth daily as needed for allergies.  . Coenzyme Q10 (CO Q-10 PO) Take 50 mg by mouth daily.    . fluticasone (FLONASE) 50 MCG/ACT nasal spray INSTILL 1 TO 2 SPRAYS IN EACH NOSTRIL ONCE OR TWICE DAILY  . levothyroxine (SYNTHROID) 75 MCG tablet TAKE 1 TABLET BY MOUTH EVERY DAY BEFORE BREAKFAST.  . meloxicam (MOBIC) 15 MG tablet TAKE 1 TABLET BY MOUTH EVERY DAY  . metoprolol tartrate (LOPRESSOR) 25 MG tablet Take 1 tablet (25 mg total) by mouth 2 (two) times daily. Need office visit for more refills.  . multivitamin (THERAGRAN) per tablet Take 1 tablet by mouth daily.    Marland Kitchen PROPYLENE GLYCOL OP Place 1 drop into both eyes 4 (four) times daily.  Marland Kitchen senna-docusate (SENOKOT-S) 8.6-50 MG per tablet Take 1 tablet by mouth daily.    Marland Kitchen zolpidem (AMBIEN) 10 MG tablet TAKE 1 TABLET BY MOUTH EVERYDAY AT BEDTIME   No facility-administered encounter medications on file as of 07/25/2019.     Activities of Daily Living No flowsheet data found.  Patient Care Team: [name], MD as PCP - General (Internal Medicine) [name], MD as PCP - Cardiology (Cardiology) [name], MD as Consulting Physician (Cardiology) Deneise Lever, MD as Consulting Physician (Pulmonary Disease) Irene Shipper, MD as Consulting Physician (Gastroenterology) Danella Sensing, MD as Consulting Physician (Dermatology) Loletta Specter, MD (Plastic Surgery) [name], MD (Obstetrics and Gynecology)    Assessment:   This is a routine wellness examination for [name].  Exercise Activities and Dietary recommendations    Goals    . <enter goal here>    . maintian current health status     Be as active and as  independent as possible. Enjoy life, family, and worship God.    . Patient Stated     Continue to be socially active and enjoy my friends, Psychologist, occupational, stay active in the church. Take time for me to do  relaxing things like read, garden and do my devotions.        Fall Risk Fall Risk  07/19/2018 07/13/2017 09/01/2016 05/29/2015 05/30/2014  Falls in the past year? No No Yes No No  Number falls in past yr: - - 1 - -  Injury with Fall? - - No - -  Risk for fall due to : Impaired balance/gait;Impaired mobility Impaired balance/gait;Impaired mobility Other (Comment) - -  Risk for fall due to: Comment - - Pt was giving blood and passed out after.  - -   Is the patient's home free of loose throw rugs in walkways, pet beds, electrical cords, etc?   {Blank single:19197::"yes","no"}      Grab bars in the bathroom? {Blank single:19197::"yes","no"}      Handrails on the stairs?   {Blank single:19197::"yes","no"}      Adequate lighting?   {Blank single:19197::"yes","no"}  Timed Get Up and Go performed: ***  Depression Screen PHQ 2/9 Scores 07/19/2018 07/13/2017 09/01/2016 05/29/2015  PHQ - 2 Score 1 0 0 0  PHQ- 9 Score 4 0 - -     Cognitive Function MMSE - Mini Mental State Exam 07/19/2018 07/13/2017  Orientation to time 5 5  Orientation to Place 5 5  Registration 3 3  Attention/ Calculation 5 5  Recall 1 2  Language- name 2 objects 2 2  Language- repeat 1 1  Language- follow 3 step command 3 3  Language- read & follow direction 1 1  Write a sentence 1 1  Copy design 1 1  Total score 28 29        Immunization History  Administered Date(s) Administered  . H1N1 11/13/2008  . Influenza Split 08/10/2011, 08/28/2012  . Influenza Whole 08/05/2008, 08/18/2009, 08/05/2010  . Influenza, High Dose Seasonal PF 07/13/2017, 07/19/2018  . Influenza,inj,Quad PF,6+ Mos 08/28/2013, 12/11/2014, 07/30/2015, 07/21/2016  . Pneumococcal Conjugate-13 09/05/2013, 05/29/2015  . Pneumococcal-Unspecified 09/05/2013   . Tetanus 05/30/2014  . Zoster 10/06/2013  . Zoster Recombinat (Shingrix) 03/08/2018, 05/08/2018    Qualifies for Shingles Vaccine?***  Screening Tests Health Maintenance  Topic Date Due  . INFLUENZA VACCINE  06/16/2019  . DEXA SCAN  04/24/2021  . TETANUS/TDAP  05/30/2024  . PNA vac Low Risk Adult  Completed    Cancer Screenings: Lung: Low Dose CT Chest recommended if Age 41-80 years, 30 pack-year currently smoking OR have quit w/in 15years. Patient {DOES NOT does:27190::"does not"} qualify. Breast:  Up to date on Mammogram? {Yes/No:30480221}   Up to date of Bone Density/Dexa? {Yes/No:30480221} Colorectal: ***  Additional Screenings: ***: Hepatitis C Screening:      Plan:   ***   I have personally reviewed and noted the following in the patient's chart:   . Medical and social history . Use of alcohol, tobacco or illicit drugs  . Current medications and supplements . Functional ability and status . Nutritional status . Physical activity . Advanced directives . List of other physicians . Hospitalizations, surgeries, and ER visits in previous 12 months . Vitals . Screenings to include cognitive, depression, and falls . Referrals and appointments  In addition, I have reviewed and discussed with patient certain preventive protocols, quality metrics, and best practice recommendations. A written personalized care plan for preventive services as well as general preventive health recommendations were provided to patient.     Michiel Cowboy, RN  07/24/2019

## 2019-07-25 ENCOUNTER — Encounter: Payer: Self-pay | Admitting: Internal Medicine

## 2019-07-25 ENCOUNTER — Ambulatory Visit (INDEPENDENT_AMBULATORY_CARE_PROVIDER_SITE_OTHER): Payer: Medicare Other | Admitting: Internal Medicine

## 2019-07-25 ENCOUNTER — Ambulatory Visit: Payer: Medicare Other

## 2019-07-25 ENCOUNTER — Other Ambulatory Visit (INDEPENDENT_AMBULATORY_CARE_PROVIDER_SITE_OTHER): Payer: Medicare Other

## 2019-07-25 ENCOUNTER — Other Ambulatory Visit: Payer: Self-pay

## 2019-07-25 VITALS — BP 152/72 | HR 52 | Temp 98.0°F | Resp 16 | Ht 63.0 in | Wt 133.0 lb

## 2019-07-25 DIAGNOSIS — E785 Hyperlipidemia, unspecified: Secondary | ICD-10-CM | POA: Diagnosis not present

## 2019-07-25 DIAGNOSIS — Z23 Encounter for immunization: Secondary | ICD-10-CM | POA: Diagnosis not present

## 2019-07-25 DIAGNOSIS — I2510 Atherosclerotic heart disease of native coronary artery without angina pectoris: Secondary | ICD-10-CM

## 2019-07-25 DIAGNOSIS — M810 Age-related osteoporosis without current pathological fracture: Secondary | ICD-10-CM

## 2019-07-25 DIAGNOSIS — I10 Essential (primary) hypertension: Secondary | ICD-10-CM

## 2019-07-25 DIAGNOSIS — G4700 Insomnia, unspecified: Secondary | ICD-10-CM

## 2019-07-25 DIAGNOSIS — E039 Hypothyroidism, unspecified: Secondary | ICD-10-CM | POA: Diagnosis not present

## 2019-07-25 DIAGNOSIS — I350 Nonrheumatic aortic (valve) stenosis: Secondary | ICD-10-CM | POA: Diagnosis not present

## 2019-07-25 LAB — CBC WITH DIFFERENTIAL/PLATELET
Basophils Absolute: 0.1 10*3/uL (ref 0.0–0.1)
Basophils Relative: 0.9 % (ref 0.0–3.0)
Eosinophils Absolute: 0.3 10*3/uL (ref 0.0–0.7)
Eosinophils Relative: 4.8 % (ref 0.0–5.0)
HCT: 41.3 % (ref 36.0–46.0)
Hemoglobin: 13.7 g/dL (ref 12.0–15.0)
Lymphocytes Relative: 35.4 % (ref 12.0–46.0)
Lymphs Abs: 2.2 10*3/uL (ref 0.7–4.0)
MCHC: 33.2 g/dL (ref 30.0–36.0)
MCV: 90.8 fl (ref 78.0–100.0)
Monocytes Absolute: 0.5 10*3/uL (ref 0.1–1.0)
Monocytes Relative: 8.2 % (ref 3.0–12.0)
Neutro Abs: 3.2 10*3/uL (ref 1.4–7.7)
Neutrophils Relative %: 50.7 % (ref 43.0–77.0)
Platelets: 248 10*3/uL (ref 150.0–400.0)
RBC: 4.55 Mil/uL (ref 3.87–5.11)
RDW: 14 % (ref 11.5–15.5)
WBC: 6.3 10*3/uL (ref 4.0–10.5)

## 2019-07-25 LAB — COMPREHENSIVE METABOLIC PANEL
ALT: 18 U/L (ref 0–35)
AST: 24 U/L (ref 0–37)
Albumin: 4.2 g/dL (ref 3.5–5.2)
Alkaline Phosphatase: 60 U/L (ref 39–117)
BUN: 16 mg/dL (ref 6–23)
CO2: 29 mEq/L (ref 19–32)
Calcium: 9.7 mg/dL (ref 8.4–10.5)
Chloride: 103 mEq/L (ref 96–112)
Creatinine, Ser: 0.7 mg/dL (ref 0.40–1.20)
GFR: 80.41 mL/min (ref >60.00)
Glucose, Bld: 82 mg/dL (ref 70–99)
Potassium: 4.1 mEq/L (ref 3.5–5.1)
Sodium: 139 mEq/L (ref 135–145)
Total Bilirubin: 1.2 mg/dL (ref 0.2–1.2)
Total Protein: 6.9 g/dL (ref 6.0–8.3)

## 2019-07-25 LAB — LIPID PANEL
Cholesterol: 132 mg/dL (ref 0–200)
HDL: 48.9 mg/dL (ref >39.00)
LDL Cholesterol: 67 mg/dL (ref 0–99)
NonHDL: 82.73
Total CHOL/HDL Ratio: 3
Triglycerides: 78 mg/dL (ref 0.0–149.0)
VLDL: 15.6 mg/dL (ref 0.0–40.0)

## 2019-07-25 LAB — TSH: TSH: 2.48 u[IU]/mL (ref 0.35–4.50)

## 2019-07-25 NOTE — Patient Instructions (Signed)
  Tests ordered today. Your results will be released to MyChart (or called to you) after review.  If any changes need to be made, you will be notified at that same time.  Flu immunization administered today.    Medications reviewed and updated.  Changes include :  none      Please followup in 6 months   

## 2019-07-25 NOTE — Assessment & Plan Note (Signed)
Check lipid panel, TSH, CMP Continue statin Continue regular exercise and healthy diet

## 2019-07-25 NOTE — Assessment & Plan Note (Signed)
Asymptomatic. Following with cardiology.

## 2019-07-25 NOTE — Assessment & Plan Note (Signed)
Sleep is controlled with 1/4 of a tab of ambien  No side effects, controlled continue

## 2019-07-25 NOTE — Assessment & Plan Note (Signed)
DEXA up-to-date Walking regularly Taking calcium and vitamin D Continue above

## 2019-07-25 NOTE — Assessment & Plan Note (Signed)
Clinically euthyroid Check tsh  Titrate med dose if needed  

## 2019-07-25 NOTE — Assessment & Plan Note (Signed)
Blood pressure slightly elevated here today, typically better controlled We will continue current medications at current doses CMP

## 2019-07-26 ENCOUNTER — Telehealth: Payer: Self-pay | Admitting: Internal Medicine

## 2019-07-26 NOTE — Telephone Encounter (Signed)
Noted  

## 2019-07-26 NOTE — Telephone Encounter (Signed)
Patient called back.  I have gave patient Dr. Quay Burow response to recent labs.  Patient understood.

## 2019-07-30 ENCOUNTER — Ambulatory Visit (INDEPENDENT_AMBULATORY_CARE_PROVIDER_SITE_OTHER): Payer: Medicare Other | Admitting: *Deleted

## 2019-07-30 DIAGNOSIS — Z0000 Encounter for general adult medical examination without abnormal findings: Secondary | ICD-10-CM | POA: Diagnosis not present

## 2019-07-30 NOTE — Progress Notes (Addendum)
Subjective:   [name] is a 78 y.o. female who presents for Medicare Annual (Subsequent) preventive examination. I connected with patient by a telephone and verified that I am speaking with the correct person using two identifiers. Patient stated full name and DOB. Patient gave permission to continue with telephonic visit. Patient's location was at home and Nurse's location was at [place] office.   Review of Systems:   Cardiac Risk Factors include: advanced age (>40men, >7 women);dyslipidemia;hypertension Sleep patterns: feels rested on waking, gets up 2-3 times nightly to void and sleeps 7-8 hours nightly.    Home Safety/Smoke Alarms: Feels safe in home. Smoke alarms in place.  Living environment; residence and Firearm Safety: 1-story house/ trailer. Lives alone, no needs for DME, good support system Seat Belt Safety/Bike Helmet: Wears seat belt.     Objective:     Vitals: There were no vitals taken for this visit.  There is no height or weight on file to calculate BMI.  Advanced Directives 07/19/2018 07/13/2017 03/30/2017 08/20/2016 01/17/2016  Does Patient Have a Medical Advance Directive? Yes Yes Yes Yes No  Type of Paramedic of [place];Living will [place];Living will [place];Living will [place];Living will -  Copy of [place] in Chart? No - copy requested No - copy requested - - -  Would patient like information on creating a medical advance directive? - - - - No - patient declined information    Tobacco Social History   Tobacco Use  Smoking Status Never Smoker  Smokeless Tobacco Never Used     Counseling given: Not Answered  Past Medical History:  Diagnosis Date  . ALLERGIC RHINITIS   . Allergy    SEASONAL  . Anxiety   . Aortic stenosis, moderate   . Blood transfusion without reported diagnosis   . BREAST CANCER 1991   left, 1991; B mastectomy  . Cataract     BILATERAL-REMOVED  . Coronary artery disease    s/p CABGx5 2007 //Nuclear stress test 10/18: EF 76, no ischemia or infarction, low risk  . Depression   . DIVERTICULOSIS   . DVT   . EROSIVE GASTRITIS   . GERD   . Heart murmur   . History of DVT (deep vein thrombosis)   . HYPERLIPIDEMIA   . Hypertension   . HYPOTHYROIDISM   . Occlusion and stenosis of carotid artery without mention of cerebral infarction   . Osteopenia   . OSTEOPOROSIS   . RHINOSINUSITIS, RECURRENT    Past Surgical History:  Procedure Laterality Date  . APPENDECTOMY    . COLONOSCOPY    . CORONARY ARTERY BYPASS GRAFT     5        2007  . MASTECTOMY     left with reconstruction and lymph node excision  1992  . MASTECTOMY     right with reconstruction  . REVISION RECONSTRUCTED BREAST Left 02/2014   willard (plastics in [place])  . right ankle arthroscopy    . TONSILLECTOMY AND ADENOIDECTOMY    . UPPER GASTROINTESTINAL ENDOSCOPY    . VAGINAL HYSTERECTOMY     ovaries not excised   Family History  Problem Relation Age of Onset  . Colon cancer Sister   . Colon cancer Sister   . Breast cancer Mother   . Lung disease Mother   . Emphysema Father   . Esophageal cancer Neg Hx   . Rectal cancer Neg Hx   .  Stomach cancer Neg Hx    Social History   Socioeconomic History  . Marital status: Divorced    Spouse name: Not on file  . Number of children: 2  . Years of education: Not on file  . Highest education level: Not on file  Occupational History  . Occupation: retired  Scientific laboratory technician  . Financial resource strain: Not hard at all  . Food insecurity    Worry: Never true    Inability: Never true  . Transportation needs    Medical: No    Non-medical: No  Tobacco Use  . Smoking status: Never Smoker  . Smokeless tobacco: Never Used  Substance and Sexual Activity  . Alcohol use: No    Alcohol/week: 0.0 standard drinks  . Drug use: No  . Sexual activity: Not Currently  Lifestyle  . Physical activity     Days per week: 5 days    Minutes per session: 50 min  . Stress: Not at all  Relationships  . Social connections    Talks on phone: More than three times a week    Gets together: More than three times a week    Attends religious service: More than 4 times per year    Active member of club or organization: Yes    Attends meetings of clubs or organizations: More than 4 times per year    Relationship status: Divorced  Other Topics Concern  . 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 aspirin 81 MG tablet Take 81 mg by mouth daily.    Marland Kitchen atorvastatin (LIPITOR) 20 MG tablet TAKE 1 TABLET BY MOUTH DAILY. NEED OFFICE VISIT FOR MORE REFILLS  . Calcium Carbonate (CALTRATE 600 PO) Take 1 tablet by mouth 2 (two) times daily.    . chlorpheniramine (CHLOR-TRIMETON) 4 MG tablet Take 4 mg by mouth daily as needed for allergies.  . Coenzyme Q10 (CO Q-10 PO) Take 50 mg by mouth daily.    . fluticasone (FLONASE) 50 MCG/ACT nasal spray INSTILL 1 TO 2 SPRAYS IN EACH NOSTRIL ONCE OR TWICE DAILY  . levothyroxine (SYNTHROID) 75 MCG tablet TAKE 1 TABLET BY MOUTH EVERY DAY BEFORE BREAKFAST.  . meloxicam (MOBIC) 15 MG tablet TAKE 1 TABLET BY MOUTH EVERY DAY  . metoprolol tartrate (LOPRESSOR) 25 MG tablet Take 1 tablet (25 mg total) by mouth 2 (two) times daily. Need office visit for more refills.  . multivitamin (THERAGRAN) per tablet Take 1 tablet by mouth daily.    Marland Kitchen PROPYLENE GLYCOL OP Place 1 drop into both eyes 4 (four) times daily.  Marland Kitchen senna-docusate (SENOKOT-S) 8.6-50 MG per tablet Take 1 tablet by mouth daily.    Marland Kitchen VITAMIN D, CHOLECALCIFEROL, PO Take by mouth.  . zolpidem (AMBIEN) 10 MG tablet TAKE 1 TABLET BY MOUTH EVERYDAY AT BEDTIME   No facility-administered encounter medications on file as of 07/30/2019.     Activities of Daily Living  In your present state of health, do you have any difficulty performing the following activities: 07/30/2019  Hearing? N  Vision? N  Difficulty concentrating or making decisions? N  Walking or climbing stairs? N  Dressing or bathing? N  Doing errands, shopping? N  Preparing Food and eating ? N  Using the Toilet? N  In the past six months, have you accidently  leaked urine? N  Do you have problems with loss of bowel control? N  Managing your Medications? N  Managing your Finances? N  Housekeeping or managing your Housekeeping? N  Some recent data might be hidden    Patient Care Team: [name], MD as PCP - General (Internal Medicine) [name], MD as PCP - Cardiology (Cardiology) [name], MD as Consulting Physician (Cardiology) Deneise Lever, MD as Consulting Physician (Pulmonary Disease) Irene Shipper, MD as Consulting Physician (Gastroenterology) Danella Sensing, MD as Consulting Physician (Dermatology) Loletta Specter, MD (Plastic Surgery) [name], MD (Obstetrics and Gynecology)    Assessment:   This is a routine wellness examination for Janiel. Physical assessment deferred to PCP.  Exercise Activities and Dietary recommendations Current Exercise Habits: Home exercise routine, Type of exercise: walking, Time (Minutes): 40, Frequency (Times/Week): 5, Weekly Exercise (Minutes/Week): 200, Intensity: Mild, Exercise limited by: orthopedic condition(s)  Diet (meal preparation, eat out, water intake, caffeinated beverages, dairy products, fruits and vegetables): in general, a "healthy" diet  , well balanced   Reviewed heart healthy and diabetic diet. Encouraged patient to increase daily water and healthy fluid intake.  Goals    . <enter goal here>    . maintian current health status     Be as active and as independent as possible. Enjoy life, family, and worship God.    . Patient Stated     Continue to be socially active and enjoy my friends,  Psychologist, occupational, stay active in the church. Take time for me to do relaxing things like read, garden and do my devotions.        Fall Risk Fall Risk  07/30/2019 07/19/2018 07/13/2017 09/01/2016 05/29/2015  Falls in the past year? 0 No No Yes No  Number falls in past yr: 0 - - 1 -  Injury with Fall? 0 - - No -  Risk for fall due to : Impaired balance/gait Impaired balance/gait;Impaired mobility Impaired balance/gait;Impaired mobility Other (Comment) -  Risk for fall due to: Comment - - - Pt was giving blood and passed out after.  -    Depression Screen PHQ 2/9 Scores 07/30/2019 07/19/2018 07/13/2017 09/01/2016  PHQ - 2 Score 0 1 0 0  PHQ- 9 Score - 4 0 -     Cognitive Function MMSE - Mini Mental State Exam 07/19/2018 07/13/2017  Orientation to time 5 5  Orientation to Place 5 5  Registration 3 3  Attention/ Calculation 5 5  Recall 1 2  Language- name 2 objects 2 2  Language- repeat 1 1  Language- follow 3 step command 3 3  Language- read & follow direction 1 1  Write a sentence 1 1  Copy design 1 1  Total score 28 29       Ad8 score reviewed for issues:  Issues making decisions: no  Less interest in hobbies / activities: no  Repeats questions, stories (family complaining): no  Trouble using ordinary gadgets (microwave, computer, phone):no  Forgets the month or year: no  Mismanaging finances: no  Remembering appts: no  Daily problems with thinking and/or memory: no Ad8 score is= 0  Immunization History  Administered Date(s) Administered  . Fluad Quad(high Dose 65+) 07/25/2019  . H1N1 11/13/2008  . Influenza Split 08/10/2011, 08/28/2012  . Influenza Whole 08/05/2008, 08/18/2009, 08/05/2010  . Influenza, High Dose Seasonal PF 07/13/2017, 07/19/2018  . Influenza,inj,Quad PF,6+ Mos 08/28/2013, 12/11/2014, 07/30/2015, 07/21/2016  . Pneumococcal Conjugate-13 09/05/2013, 05/29/2015  . Pneumococcal-Unspecified 09/05/2013  .  Tetanus 05/30/2014  . Zoster 10/06/2013  . Zoster  Recombinat (Shingrix) 03/08/2018, 05/08/2018   Screening Tests Health Maintenance  Topic Date Due  . DEXA SCAN  04/24/2021  . TETANUS/TDAP  05/30/2024  . INFLUENZA VACCINE  Completed  . PNA vac Low Risk Adult  Completed      Plan:     Reviewed health maintenance screenings with patient today and relevant education, vaccines, and/or referrals were provided.   Continue to eat heart healthy diet (full of fruits, vegetables, whole grains, lean protein, water--limit salt, fat, and sugar intake) and increase physical activity as tolerated.  Continue doing brain stimulating activities (puzzles, reading, adult coloring books, staying active) to keep memory sharp.   I have personally reviewed and noted the following in the patient's chart:   . Medical and social history . Use of alcohol, tobacco or illicit drugs  . Current medications and supplements . Functional ability and status . Nutritional status . Physical activity . Advanced directives . List of other physicians . Screenings to include cognitive, depression, and falls . Referrals and appointments  In addition, I have reviewed and discussed with patient certain preventive protocols, quality metrics, and best practice recommendations. A written personalized care plan for preventive services as well as general preventive health recommendations were provided to patient.     Michiel Cowboy, RN  07/30/2019   Medical screening examination/treatment/procedure(s) were performed by non-physician practitioner and as supervising physician I was immediately available for consultation/collaboration. I agree with above. [name], MD

## 2019-08-03 ENCOUNTER — Other Ambulatory Visit: Payer: Self-pay | Admitting: Internal Medicine

## 2019-08-14 ENCOUNTER — Other Ambulatory Visit: Payer: Self-pay | Admitting: Internal Medicine

## 2019-08-15 NOTE — Telephone Encounter (Signed)
Last OV 07/25/19 Next OV 01/23/20 Last RF 05/25/19

## 2019-08-27 ENCOUNTER — Other Ambulatory Visit: Payer: Self-pay | Admitting: Internal Medicine

## 2019-09-05 ENCOUNTER — Telehealth (HOSPITAL_COMMUNITY): Payer: Self-pay

## 2019-09-05 NOTE — Telephone Encounter (Signed)
New message    Call the patient to R/s echocardiogram.   The patient has questions regarding labs and would like the nurse to call her back today if possible.

## 2019-09-05 NOTE — Telephone Encounter (Signed)
The patient's echo was moved to next week due to echo technician scheduling conflict. Offered to reschedule echo and OV to another day, but the patient declined. She wishes to keep OV tomorrow and echo next week and to be called with results. She understands lab work was drawn at PCP last month and is available for Dr. Burt Knack to review. She was grateful for assistance.

## 2019-09-06 ENCOUNTER — Encounter: Payer: Self-pay | Admitting: Cardiovascular Disease

## 2019-09-06 ENCOUNTER — Other Ambulatory Visit: Payer: Self-pay

## 2019-09-06 ENCOUNTER — Other Ambulatory Visit (HOSPITAL_COMMUNITY): Payer: Medicare Other

## 2019-09-06 ENCOUNTER — Ambulatory Visit (INDEPENDENT_AMBULATORY_CARE_PROVIDER_SITE_OTHER): Payer: Medicare Other | Admitting: Cardiovascular Disease

## 2019-09-06 VITALS — BP 126/78 | HR 53 | Ht 63.0 in | Wt 133.0 lb

## 2019-09-06 DIAGNOSIS — I5032 Chronic diastolic (congestive) heart failure: Secondary | ICD-10-CM | POA: Diagnosis not present

## 2019-09-06 DIAGNOSIS — E782 Mixed hyperlipidemia: Secondary | ICD-10-CM | POA: Diagnosis not present

## 2019-09-06 DIAGNOSIS — I2510 Atherosclerotic heart disease of native coronary artery without angina pectoris: Secondary | ICD-10-CM | POA: Diagnosis not present

## 2019-09-06 DIAGNOSIS — I350 Nonrheumatic aortic (valve) stenosis: Secondary | ICD-10-CM | POA: Diagnosis not present

## 2019-09-06 NOTE — Patient Instructions (Signed)
Medication Instructions:  Your provider recommends that you continue on your current medications as directed. Please refer to the Current Medication list given to you today.   *If you need a refill on your cardiac medications before your next appointment, please call your pharmacy*  Testing/Procedures: Please keep your appointment for your echocardiogram next week.  Follow-Up: At [facility], you and your health needs are our priority.  As part of our continuing mission to provide you with exceptional heart care, we have created designated Provider Care Teams.  These Care Teams include your primary Cardiologist (physician) and Advanced Practice Providers (APPs -  Physician Assistants and Nurse Practitioners) who all work together to provide you with the care you need, when you need it. Your next appointment:   6 months The format for your next appointment:   In Person Provider:   You may see [name], MD or one of the following Advanced Practice Providers on your designated Care Team:    Richardson Dopp, PA-C  Vin Goddard, [place]  Daune Perch, [place]

## 2019-09-06 NOTE — Progress Notes (Signed)
Cardiology Office Note:    Date:  09/06/2019   ID:  Angelica, Mullen Nov 02, 1941, MRN [id number]  PCP:  [name], MD  Cardiologist:  [name], MD  Electrophysiologist:  None   Referring MD: [name], MD   Chief Complaint  Patient presents with  . Coronary Artery Disease    History of Present Illness:    [name] is a 78 y.o. female with a hx of coronary artery disease and aortic stenosis presenting for follow-up evaluation.  The patient underwent CABG in 2007.  She had repeat heart catheterization in 2011 when she developed recurrent symptoms of chest discomfort and this demonstrated continued patency of all of her bypass grafts.  She has developed mild to moderate aortic stenosis and has been followed with serial echo and exams.  She is here alone today. She walked 2 miles before the visit. Today, she denies symptoms of palpitations, chest pain, shortness of breath, orthopnea, PND, lower extremity edema, dizziness, or syncope. Notes that she walks at a slow pace, but brisk enough to 'break a sweat.'   Past Medical History:  Diagnosis Date  . ALLERGIC RHINITIS   . Allergy    SEASONAL  . Anxiety   . Aortic stenosis, moderate   . Blood transfusion without reported diagnosis   . BREAST CANCER 1991   left, 1991; B mastectomy  . Cataract    BILATERAL-REMOVED  . Coronary artery disease    s/p CABGx5 2007 //Nuclear stress test 10/18: EF 76, no ischemia or infarction, low risk  . Depression   . DIVERTICULOSIS   . DVT   . EROSIVE GASTRITIS   . GERD   . Heart murmur   . History of DVT (deep vein thrombosis)   . HYPERLIPIDEMIA   . Hypertension   . HYPOTHYROIDISM   . Occlusion and stenosis of carotid artery without mention of cerebral infarction   . Osteopenia   . OSTEOPOROSIS   . RHINOSINUSITIS, RECURRENT     Past Surgical History:  Procedure Laterality Date  . APPENDECTOMY    . COLONOSCOPY    . CORONARY ARTERY BYPASS GRAFT     5        2007  .  MASTECTOMY     left with reconstruction and lymph node excision  1992  . MASTECTOMY     right with reconstruction  . REVISION RECONSTRUCTED BREAST Left 02/2014   willard (plastics in [place])  . right ankle arthroscopy    . TONSILLECTOMY AND ADENOIDECTOMY    . UPPER GASTROINTESTINAL ENDOSCOPY    . VAGINAL HYSTERECTOMY     ovaries not excised    Current Medications: Current Meds  Medication Sig  . aspirin 81 MG tablet Take 81 mg by mouth daily.    Marland Kitchen atorvastatin (LIPITOR) 20 MG tablet Take 1 tablet by mouth daily.  . Calcium Carbonate (CALTRATE 600 PO) Take 1 tablet by mouth 2 (two) times daily.    . chlorpheniramine (CHLOR-TRIMETON) 4 MG tablet Take 4 mg by mouth daily as needed for allergies.  . Coenzyme Q10 (CO Q-10 PO) Take 50 mg by mouth daily.    . fluticasone (FLONASE) 50 MCG/ACT nasal spray INSTILL 1 TO 2 SPRAYS IN EACH NOSTRIL ONCE OR TWICE DAILY  . levothyroxine (SYNTHROID) 75 MCG tablet TAKE 1 TABLET BY MOUTH EVERY DAY BEFORE BREAKFAST.  . meloxicam (MOBIC) 15 MG tablet TAKE 1 TABLET BY MOUTH EVERY DAY  . metoprolol tartrate (LOPRESSOR) 25 MG tablet Take 1  tablet by mouth 2 times daily.  . multivitamin (THERAGRAN) per tablet Take 1 tablet by mouth daily.    Marland Kitchen PROPYLENE GLYCOL OP Place 1 drop into both eyes 4 (four) times daily.  Marland Kitchen senna-docusate (SENOKOT-S) 8.6-50 MG per tablet Take 1 tablet by mouth daily.    Marland Kitchen VITAMIN D, CHOLECALCIFEROL, PO Take by mouth.  . zolpidem (AMBIEN) 10 MG tablet TAKE 1 TABLET BY MOUTH EVERYDAY AT BEDTIME     Allergies:   Clarithromycin, Codeine, Levofloxacin, Morphine, Naproxen, Penicillins, and Pneumococcal vaccines   Social History   Socioeconomic History  . Marital status: Divorced    Spouse name: Not on file  . Number of children: 2  . Years of education: Not on file  . Highest education level: Not on file  Occupational History  . Occupation: retired  Scientific laboratory technician  . Financial resource strain: Not hard at all  . Food insecurity     Worry: Never true    Inability: Never true  . Transportation needs    Medical: No    Non-medical: No  Tobacco Use  . Smoking status: Never Smoker  . Smokeless tobacco: Never Used  Substance and Sexual Activity  . Alcohol use: No    Alcohol/week: 0.0 standard drinks  . Drug use: No  . Sexual activity: Not Currently  Lifestyle  . Physical activity    Days per week: 5 days    Minutes per session: 50 min  . Stress: Not at all  Relationships  . Social connections    Talks on phone: More than three times a week    Gets together: More than three times a week    Attends religious service: More than 4 times per year    Active member of club or organization: Yes    Attends meetings of clubs or organizations: More than 4 times per year    Relationship status: Divorced  Other Topics Concern  . 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 There is no history of Esophageal cancer, Rectal cancer, or Stomach cancer.  ROS:   Please see the history of present illness.    All other systems reviewed and are negative.  EKGs/Labs/Other Studies Reviewed:    The following studies were reviewed today: Echo 07-31-2018: Study Conclusions  - Left ventricle: The cavity size was normal. Systolic function was   normal. The estimated ejection fraction was in the range of 55%   to 60%. Wall motion was normal; there were no regional wall   motion abnormalities. Doppler parameters are consistent with a   reversible restrictive pattern, indicative of decreased left   ventricular diastolic compliance and/or increased left atrial   pressure (grade 3 diastolic dysfunction). - Aortic valve: There was mild to moderate stenosis. There was  mild   regurgitation. Mean gradient (S): 17 mm Hg. Peak gradient (S): 29   mm Hg. Valve area (VTI): 1.11 cm^2. - Mitral valve: Calcified annulus. Mildly thickened leaflets .   There was mild regurgitation. - Left atrium: The atrium was moderately dilated. - Right ventricle: Systolic function was normal. - Atrial septum: No defect or patent foramen ovale was identified. - Tricuspid valve: There was mild regurgitation. - Pulmonic  valve: There was moderate regurgitation. - Pulmonary arteries: Systolic pressure was mildly to moderately   increased.  Impressions:  - Normal LV EF, grade 3 diastolic dysfunction, mild to moderate   aortic stenosis.  Left ventricle:  The cavity size was normal. Systolic function was normal. The estimated ejection fraction was in the range of 55% to 60%. Wall motion was normal; there were no regional wall motion abnormalities. Doppler parameters are consistent with a reversible restrictive pattern, indicative of decreased left ventricular diastolic compliance and/or increased left atrial pressure (grade 3 diastolic dysfunction).  ------------------------------------------------------------------- Aortic valve:   Trileaflet; moderately thickened, moderately calcified leaflets. Mobility was not restricted.  Doppler:   There was mild to moderate stenosis.   There was mild regurgitation. VTI ratio of LVOT to aortic valve: 0.39. Valve area (VTI): 1.11 cm^2. Indexed valve area (VTI): 0.66 cm^2/m^2. Peak velocity ratio of LVOT to aortic valve: 0.34. Valve area (Vmax): 0.98 cm^2. Indexed valve area (Vmax): 0.58 cm^2/m^2. Mean velocity ratio of LVOT to aortic valve: 0.41. Valve area (Vmean): 1.16 cm^2. Indexed valve area (Vmean): 0.69 cm^2/m^2.    Mean gradient (S): 17 mm Hg. Peak gradient (S): 29 mm Hg.  ------------------------------------------------------------------- Aorta:  Aortic root: The aortic root was normal in size.   ------------------------------------------------------------------- Mitral valve:   Calcified annulus. Mildly thickened leaflets . Mobility was not restricted.  Doppler:  Transvalvular velocity was within the normal range. There was no evidence for stenosis. There was mild regurgitation.    Peak gradient (D): 8 mm Hg.  ------------------------------------------------------------------- Left atrium:  The atrium was moderately dilated.  ------------------------------------------------------------------- Atrial septum:  No defect or patent foramen ovale was identified.   ------------------------------------------------------------------- Right ventricle:  The cavity size was normal. Wall thickness was normal. Systolic function was normal.  ------------------------------------------------------------------- Pulmonic valve:    The valve appears to be grossly normal. Doppler:  Transvalvular velocity was within the normal range. There was no evidence for stenosis. There was moderate regurgitation.  ------------------------------------------------------------------- Tricuspid valve:   Structurally normal valve.    Doppler: Transvalvular velocity was within the normal range. There was mild regurgitation.  ------------------------------------------------------------------- Pulmonary artery:   The main pulmonary artery was normal-sized. Systolic pressure was mildly to moderately increased.  ------------------------------------------------------------------- Right atrium:  The atrium was normal in size.  ------------------------------------------------------------------- Pericardium:  There was no pericardial effusion.  ------------------------------------------------------------------- Systemic veins: Inferior vena cava: The vessel was normal in size. The respirophasic diameter changes were in the normal range (>= 50%), consistent with normal central venous pressure.   Myocardial Perfusion Study 09-02-2017: Study Highlights    Nuclear stress EF: 76%.  The left ventricular ejection fraction is hyperdynamic (>65%).  There was no ST segment deviation noted during stress.  The study is normal.  This is a low risk study.   Normal stress nuclear study with no ischemia or infarction; EF 76 with normal wall motion.    EKG:  EKG is ordered today.  The ekg ordered today demonstrates sinus brady 53 bpm, cannot rule out age-indeterminate anterior infarct  Recent Labs: 07/25/2019: ALT 18; BUN 16; Creatinine, Ser 0.70; Hemoglobin 13.7; Platelets 248.0; Potassium 4.1; Sodium 139; TSH 2.48  Recent Lipid Panel    Component Value Date/Time   CHOL 132 07/25/2019 1432   CHOL 134 07/28/2018 1153   TRIG 78.0 07/25/2019 1432   HDL 48.90 07/25/2019 1432   HDL 52 07/28/2018 1153   CHOLHDL 3 07/25/2019 1432   VLDL 15.6 07/25/2019 1432   LDLCALC 67 07/25/2019 1432   LDLCALC 71  07/28/2018 1153    Physical Exam:    VS:  BP 126/78   Pulse (!) 53   Ht 5\' 3"  (1.6 m)   Wt 133 lb (60.3 kg)   SpO2 97%   BMI 23.56 kg/m     Wt Readings from Last 3 Encounters:  09/06/19 133 lb (60.3 kg)  07/25/19 133 lb (60.3 kg)  04/19/19 133 lb 9.6 oz (60.6 kg)     GEN: Well nourished, well developed in no acute distress HEENT: Normal NECK: No JVD; No carotid bruits LYMPHATICS: No lymphadenopathy CARDIAC: RRR, 2/6 harsh mid peaking systolic murmur at the right upper sternal border RESPIRATORY:  Clear to auscultation without rales, wheezing or rhonchi  ABDOMEN: Soft, non-tender, non-distended MUSCULOSKELETAL:  No edema; No deformity  SKIN: Warm and dry NEUROLOGIC:  Alert and oriented x 3 PSYCHIATRIC:  Normal affect   ASSESSMENT:    1. Coronary artery disease involving native coronary artery of native heart without angina pectoris   2. Aortic stenosis, moderate   3. Mixed hyperlipidemia   4. Chronic diastolic heart failure (HCC)    PLAN:    In order of problems  listed above:  1. The patient is stable without symptoms of angina.  She will continue on aspirin, atorvastatin, and metoprolol. 2. Exam remains consistent with moderate aortic stenosis.  She has an upcoming echo for continued surveillance.  We again discussed the natural history of aortic stenosis and potential symptoms that can arise with this. 3. Lipids are reviewed with cholesterol 132, HDL 49, LDL 67.  At goal on atorvastatin 20 mg daily. 4. Appears stable with no significant functional limitation.  No evidence of volume overload on exam.  I think she is doing very well.  I will see her back in 6 months.   Medication Adjustments/Labs and Tests Ordered: Current medicines are reviewed at length with the patient today.  Concerns regarding medicines are outlined above.  Orders Placed This Encounter  Procedures  . EKG 12-Lead   No orders of the defined types were placed in this encounter.   There are no Patient Instructions on file for this visit.   Signed, [name], MD  09/06/2019 2:36 PM    [place]

## 2019-09-10 ENCOUNTER — Other Ambulatory Visit: Payer: Self-pay | Admitting: Internal Medicine

## 2019-09-11 ENCOUNTER — Ambulatory Visit (HOSPITAL_COMMUNITY): Payer: Medicare Other | Attending: Cardiology

## 2019-09-11 ENCOUNTER — Other Ambulatory Visit: Payer: Self-pay

## 2019-09-11 DIAGNOSIS — I350 Nonrheumatic aortic (valve) stenosis: Secondary | ICD-10-CM | POA: Diagnosis not present

## 2019-09-12 DIAGNOSIS — Z85828 Personal history of other malignant neoplasm of skin: Secondary | ICD-10-CM | POA: Diagnosis not present

## 2019-09-12 DIAGNOSIS — D1801 Hemangioma of skin and subcutaneous tissue: Secondary | ICD-10-CM | POA: Diagnosis not present

## 2019-09-12 DIAGNOSIS — L570 Actinic keratosis: Secondary | ICD-10-CM | POA: Diagnosis not present

## 2019-09-12 DIAGNOSIS — L821 Other seborrheic keratosis: Secondary | ICD-10-CM | POA: Diagnosis not present

## 2019-09-12 DIAGNOSIS — L820 Inflamed seborrheic keratosis: Secondary | ICD-10-CM | POA: Diagnosis not present

## 2019-09-27 ENCOUNTER — Other Ambulatory Visit: Payer: Self-pay | Admitting: Internal Medicine

## 2019-10-08 DIAGNOSIS — H04123 Dry eye syndrome of bilateral lacrimal glands: Secondary | ICD-10-CM | POA: Diagnosis not present

## 2019-10-18 DIAGNOSIS — H04552 Acquired stenosis of left nasolacrimal duct: Secondary | ICD-10-CM | POA: Diagnosis not present

## 2019-10-24 ENCOUNTER — Other Ambulatory Visit: Payer: Self-pay | Admitting: Internal Medicine

## 2019-11-23 ENCOUNTER — Telehealth: Payer: Self-pay | Admitting: Cardiovascular Disease

## 2019-11-23 NOTE — Telephone Encounter (Signed)
We are recommending the COVID-19 vaccine to all of our patients. Cardiac medications (including blood thinners) should not deter anyone from being vaccinated and there is no need to hold any of those medications prior to vaccine administration.     Currently, there is a hotline to call (active 11/23/19) to schedule vaccination appointments as no walk-ins will be accepted.   Number: [phone]    If you have further questions or concerns about the vaccine process, please visit [email or web address] or contact your primary care physician.  I have explain the instructions to the patient.

## 2019-12-05 ENCOUNTER — Ambulatory Visit: Payer: Medicare Other | Attending: Internal Medicine

## 2019-12-05 DIAGNOSIS — Z23 Encounter for immunization: Secondary | ICD-10-CM | POA: Diagnosis not present

## 2019-12-05 NOTE — Progress Notes (Signed)
   Covid-19 Vaccination Clinic  Name:  [name]    MRN: [id number] DOB: 03-12-1941  12/05/2019  [name] was observed post Covid-19 immunization for 15 minutes without incidence. She was provided with Vaccine Information Sheet and instruction to access the V-Safe system.   [name] was instructed to call 911 with any severe reactions post vaccine: Marland Kitchen Difficulty breathing  . Swelling of your face and throat  . A fast heartbeat  . A bad rash all over your body  . Dizziness and weakness    Immunizations Administered    Name Date Dose VIS Date Route   Pfizer COVID-19 Vaccine 12/05/2019 12:12 PM 0.3 mL 10/26/2019 Intramuscular   Manufacturer: [place]   Lot: [id number]   [place]: [id number]

## 2019-12-17 DIAGNOSIS — H04123 Dry eye syndrome of bilateral lacrimal glands: Secondary | ICD-10-CM | POA: Diagnosis not present

## 2019-12-17 DIAGNOSIS — H02132 Senile ectropion of right lower eyelid: Secondary | ICD-10-CM | POA: Diagnosis not present

## 2019-12-17 DIAGNOSIS — H04223 Epiphora due to insufficient drainage, bilateral lacrimal glands: Secondary | ICD-10-CM | POA: Diagnosis not present

## 2019-12-17 DIAGNOSIS — H02135 Senile ectropion of left lower eyelid: Secondary | ICD-10-CM | POA: Diagnosis not present

## 2019-12-17 DIAGNOSIS — H04322 Acute dacryocystitis of left lacrimal passage: Secondary | ICD-10-CM | POA: Diagnosis not present

## 2019-12-17 DIAGNOSIS — H04553 Acquired stenosis of bilateral nasolacrimal duct: Secondary | ICD-10-CM | POA: Diagnosis not present

## 2019-12-17 DIAGNOSIS — H02535 Eyelid retraction left lower eyelid: Secondary | ICD-10-CM | POA: Diagnosis not present

## 2019-12-17 DIAGNOSIS — H02532 Eyelid retraction right lower eyelid: Secondary | ICD-10-CM | POA: Diagnosis not present

## 2019-12-17 DIAGNOSIS — Z8639 Personal history of other endocrine, nutritional and metabolic disease: Secondary | ICD-10-CM | POA: Diagnosis not present

## 2019-12-26 ENCOUNTER — Ambulatory Visit: Payer: Medicare Other | Attending: Internal Medicine

## 2019-12-26 DIAGNOSIS — Z23 Encounter for immunization: Secondary | ICD-10-CM | POA: Insufficient documentation

## 2019-12-26 NOTE — Progress Notes (Signed)
   Covid-19 Vaccination Clinic  Name:  [name]    MRN: [id number] DOB: [birth year]  12/26/2019  [name] was observed post Covid-19 immunization for 15 minutes without incidence. She was provided with Vaccine Information Sheet and instruction to access the V-Safe system.   [name] was instructed to call 911 with any severe reactions post vaccine: Marland Kitchen Difficulty breathing  . Swelling of your face and throat  . A fast heartbeat  . A bad rash all over your body  . Dizziness and weakness    Immunizations Administered    Name Date Dose VIS Date Route   Pfizer COVID-19 Vaccine 12/26/2019  4:58 PM 0.3 mL 10/26/2019 Intramuscular   Manufacturer: [place]   Lot: [id number]   [place]: [id number]

## 2020-01-02 ENCOUNTER — Other Ambulatory Visit: Payer: Self-pay | Admitting: Internal Medicine

## 2020-01-08 DIAGNOSIS — H04123 Dry eye syndrome of bilateral lacrimal glands: Secondary | ICD-10-CM | POA: Diagnosis not present

## 2020-01-08 DIAGNOSIS — Z8639 Personal history of other endocrine, nutritional and metabolic disease: Secondary | ICD-10-CM | POA: Diagnosis not present

## 2020-01-08 DIAGNOSIS — H04222 Epiphora due to insufficient drainage, left lacrimal gland: Secondary | ICD-10-CM | POA: Diagnosis not present

## 2020-01-08 DIAGNOSIS — H02132 Senile ectropion of right lower eyelid: Secondary | ICD-10-CM | POA: Diagnosis not present

## 2020-01-08 DIAGNOSIS — H04322 Acute dacryocystitis of left lacrimal passage: Secondary | ICD-10-CM | POA: Diagnosis not present

## 2020-01-08 DIAGNOSIS — H02135 Senile ectropion of left lower eyelid: Secondary | ICD-10-CM | POA: Diagnosis not present

## 2020-01-08 DIAGNOSIS — H04553 Acquired stenosis of bilateral nasolacrimal duct: Secondary | ICD-10-CM | POA: Diagnosis not present

## 2020-01-08 DIAGNOSIS — H02535 Eyelid retraction left lower eyelid: Secondary | ICD-10-CM | POA: Diagnosis not present

## 2020-01-08 DIAGNOSIS — H02532 Eyelid retraction right lower eyelid: Secondary | ICD-10-CM | POA: Diagnosis not present

## 2020-01-10 ENCOUNTER — Telehealth: Payer: Self-pay | Admitting: *Deleted

## 2020-01-10 NOTE — Telephone Encounter (Signed)
   [facility] Pre-operative Risk Assessment    Request for surgical clearance:  1. What type of surgery is being performed? LEFT ENDOSCOPIC DACRYOCYSTORHINOSTOMY w/SILICONE STENT  2. When is this surgery scheduled? 01/15/20   3. What type of clearance is required (medical clearance vs. Pharmacy clearance to hold med vs. Both)? MEDICAL  4. Are there any medications that need to be held prior to surgery and how long? ASA   5. Practice name and name of physician performing surgery? [facility]; DR. Elayne Snare Z[place]   6. What is your office phone number [phone]    7.   What is your office fax number [phone]  8.   Anesthesia type (None, local, MAC, general) ? MAC   [name] 01/10/2020, 3:06 PM  _________________________________________________________________   (provider comments below)

## 2020-01-10 NOTE — Telephone Encounter (Signed)
   Primary Cardiologist: [name], MD  Chart reviewed as part of pre-operative protocol coverage. Patient was contacted 01/10/2020 in reference to pre-operative risk assessment for pending surgery as outlined below.  [name] was last seen on 09/06/19 by Dr. Burt Knack. Since that day, [name] has done well. Okay to aspirin for 5-7 days prior.   Therefore, based on ACC/AHA guidelines, the patient would be at acceptable risk for the planned procedure without further cardiovascular testing.   I will route this recommendation to the requesting party via Epic fax function and remove from pre-op pool.  Please call with questions.  [place], [place] 01/10/2020, 3:53 PM

## 2020-01-11 ENCOUNTER — Ambulatory Visit: Payer: Medicare Other

## 2020-01-16 ENCOUNTER — Telehealth: Payer: Self-pay | Admitting: Internal Medicine

## 2020-01-16 ENCOUNTER — Other Ambulatory Visit: Payer: Medicare Other

## 2020-01-16 NOTE — Telephone Encounter (Signed)
Error

## 2020-01-17 NOTE — Telephone Encounter (Signed)
Our office received another fax from [place] for pre op clearance. Pt was cleared back on 01/10/20. I tried to reach their office and was not able to reach a person. Our office cleared pt on 01/10/20 and clearance was sent at that time. I will re-fax the clearance.

## 2020-01-21 ENCOUNTER — Other Ambulatory Visit: Payer: Medicare Other

## 2020-01-21 ENCOUNTER — Telehealth: Payer: Self-pay | Admitting: Cardiovascular Disease

## 2020-01-21 ENCOUNTER — Telehealth: Payer: Self-pay | Admitting: Internal Medicine

## 2020-01-21 DIAGNOSIS — Z20828 Contact with and (suspected) exposure to other viral communicable diseases: Secondary | ICD-10-CM | POA: Diagnosis not present

## 2020-01-21 NOTE — Telephone Encounter (Signed)
    Patient requesting when her Covid test results are available they be faxed to [facility] at [phone] attn: Fraser Din  Advised patient to follow up with her surgeon at [facility] to have them obtain the results

## 2020-01-21 NOTE — Telephone Encounter (Signed)
Patient calling in because she has a tear duct stent procedure scheduled for 01/24/20. I told her that we would need that office to call in so we could ask them a set of questions for preop clearance. Patient states that noone from that office has contacted her about needing surgical clearance but she just wants to be on the safe side. She didn't remember the name of the facility or dr performing the surgery at the time when I asked. Please advise.

## 2020-01-21 NOTE — Telephone Encounter (Signed)
Pt called our office because she wanted to be sure how long she was supposed to be holding her ASA. She states she has not heard anything from Dr. Francoise Schaumann office. I informed the pt that we cleared her back on 01/10/20 and re-sent clearance on 01/17/20. I advised the pt per Cardiologist she is to hold the ASA 5-7 days prior with the exact number of days to TBD by Dr. Lorina Rabon. Pt states she took ASA this morning and her procedure is Thursday 01/24/20. I advised the pt she needs to reach out to Dr. Linton Rump office and make sure that she is still ok to proceed with her surgery. Pt thanked me for the help. I will send notes to Dr. Lorina Rabon.

## 2020-01-22 NOTE — Patient Instructions (Addendum)
  Blood work was ordered.     Medications reviewed and updated.  Changes include :   None     Please followup in 6 months   

## 2020-01-22 NOTE — Progress Notes (Signed)
Subjective:    Patient ID: [name], female    DOB: 06/22/1941, 79 y.o.   MRN: [id number]  HPI The patient is here for follow up of their chronic medical problems, including hypertension, hyperlipidemia, hypothyroidism, osteoporosis, insomnia.  She is taking all of her medications as prescribed.   She is exercising regularly - walking 2 miles a day.     She needs to have surgery on her left tear duct tomorrow.  She had a Covid test yesterday and is waiting for the results to come back.  This is causing some stress.  She otherwise feels well and has no concerns.  Medications and allergies reviewed with patient and updated if appropriate.  Patient Active Problem List   Diagnosis Date Noted  . Breast pain, left 01/13/2019  . Poor balance 05/10/2018  . Left ventricular diastolic dysfunction [id number]  . Essential hypertension 07/27/2017  . Fall 03/02/2017  . Osteoarthritis 02/16/2017  . Urinary incontinence 02/16/2017  . Chest pain 09/01/2016  . Chronic venous insufficiency 08/20/2016  . Xiphoid pain 03/05/2016  . Insomnia 01/16/2016  . Seasonal and perennial allergic rhinitis 11/24/2015  . Asthma, mild intermittent, well-controlled 11/24/2015  . Carotid artery disease ([place]) 06/29/2011  . Hypothyroidism 11/11/2010  . Hyperlipidemia 11/11/2010  . Senile osteoporosis 11/11/2010  . RHINOSINUSITIS, RECURRENT 10/29/2008  . Personal history of malignant neoplasm of breast 03/27/2008  . DIVERTICULOSIS 03/27/2008  . CAD (coronary artery disease) 08/23/2007  . Aortic stenosis, moderate 08/23/2007  . DVT 08/23/2007    Current Outpatient Medications on File Prior to Visit  Medication Sig Dispense Refill  . aspirin 81 MG tablet Take 81 mg by mouth daily.      Marland Kitchen atorvastatin (LIPITOR) 20 MG tablet Take 1 tablet by mouth daily. 90 tablet 1  . Calcium Carbonate (CALTRATE 600 PO) Take 1 tablet by mouth 2 (two) times daily.      . chlorpheniramine (CHLOR-TRIMETON) 4 MG tablet  Take 4 mg by mouth daily as needed for allergies.    . clindamycin (CLEOCIN) 300 MG capsule Take 300 mg by mouth 3 (three) times daily.    . Coenzyme Q10 (CO Q-10 PO) Take 50 mg by mouth daily.      Marland Kitchen doxycycline (VIBRA-TABS) 100 MG tablet doxycycline hyclate 100 mg tablet  TAKE 1 TABLET BY MOUTH TWICE A DAY FOR 7 DAYS    . fluticasone (FLONASE) 50 MCG/ACT nasal spray SPRAY 2 SPRAYS INTO EACH NOSTRIL EVERY DAY 16 mL 6  . levothyroxine (SYNTHROID) 75 MCG tablet TAKE 1 TABLET BY MOUTH EVERY DAY BEFORE BREAKFAST 90 tablet 1  . meloxicam (MOBIC) 15 MG tablet TAKE 1 TABLET BY MOUTH EVERY DAY 90 tablet 0  . metoprolol tartrate (LOPRESSOR) 25 MG tablet Take 1 tablet by mouth 2 times daily. 180 tablet 1  . moxifloxacin (VIGAMOX) 0.5 % ophthalmic solution moxifloxacin 0.5 % eye drops  PLACE 1 DROP INTO LEFT EYE 4 TIMES A DAY    . multivitamin (THERAGRAN) per tablet Take 1 tablet by mouth daily.      Marland Kitchen neomycin-polymyxin-dexamethasone (MAXITROL) 0.1 % ophthalmic suspension neomycin-polymyxin-dexameth 3.5 mg/mL-10,000 unit/mL-0.1% eye drops  INSTILL 1 DROP INTO OPERATIVE EYE THREE TIMES PER DAY FOR 10 DAYS THEN STOP    . PROPYLENE GLYCOL OP Place 1 drop into both eyes 4 (four) times daily.    Marland Kitchen senna-docusate (SENOKOT-S) 8.6-50 MG per tablet Take 1 tablet by mouth daily.      . sodium chloride (OCEAN) 0.65 % nasal spray  sodium chloride 0.65 % nasal spray aerosol  USE 3-4 TIMES PER DAY STARTING DAY AFTER SURGERY AND CONTINUE FOR ONE MONTH    . trimethoprim-polymyxin b (POLYTRIM) ophthalmic solution polymyxin B sulfate 10,000 unit-trimethoprim 1 mg/mL eye drops  INSTILL 1 DROP IN THE LEFT EYE 4 TIMES PER DAY. [facility] DR. ZALDIVAR    . VITAMIN D, CHOLECALCIFEROL, PO Take by mouth.    . zolpidem (AMBIEN) 10 MG tablet TAKE 1 TABLET BY MOUTH EVERYDAY AT BEDTIME 30 tablet 0   No current facility-administered medications on file prior to visit.    Past Medical History:  Diagnosis  Date  . ALLERGIC RHINITIS   . Allergy    SEASONAL  . Anxiety   . Aortic stenosis, moderate   . Blood transfusion without reported diagnosis   . BREAST CANCER 1991   left, 1991; B mastectomy  . Cataract    BILATERAL-REMOVED  . Coronary artery disease    s/p CABGx5 2007 //Nuclear stress test 10/18: EF 76, no ischemia or infarction, low risk  . Depression   . DIVERTICULOSIS   . DVT   . EROSIVE GASTRITIS   . GERD   . Heart murmur   . History of DVT (deep vein thrombosis)   . HYPERLIPIDEMIA   . Hypertension   . HYPOTHYROIDISM   . Occlusion and stenosis of carotid artery without mention of cerebral infarction   . Osteopenia   . OSTEOPOROSIS   . RHINOSINUSITIS, RECURRENT     Past Surgical History:  Procedure Laterality Date  . APPENDECTOMY    . COLONOSCOPY    . CORONARY ARTERY BYPASS GRAFT     5        2007  . MASTECTOMY     left with reconstruction and lymph node excision  1992  . MASTECTOMY     right with reconstruction  . REVISION RECONSTRUCTED BREAST Left 02/2014   [facility]ard (plastics in [place])  . right ankle arthroscopy    . TONSILLECTOMY AND ADENOIDECTOMY    . UPPER GASTROINTESTINAL ENDOSCOPY    . VAGINAL HYSTERECTOMY     ovaries not excised    Social History   Socioeconomic History  . Marital status: Divorced    Spouse name: Not on file  . Number of children: 2  . Years of education: Not on file  . Highest education level: Not on file  Occupational History  . Occupation: retired  Tobacco Use  . Smoking status: Never Smoker  . Smokeless tobacco: Never Used  Substance and Sexual Activity  . Alcohol use: No    Alcohol/week: 0.0 standard drinks  . Drug use: No  . Sexual activity: Not Currently  Other Topics Concern  . 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 Difficulty of Paying Living Expenses:  Not on file  Food Insecurity:   . Worried About Charity fundraiser in the Last Year: Not on file  . Ran Out of Food in the Last Year: Not on file  Transportation Needs:   . Lack of Transportation (Medical): Not on file  . Lack of Transportation (Non-Medical): Not on file  Physical Activity:   . Days of Exercise per Week: Not on file  . Minutes of Exercise per Session: Not on file  Stress:   . Feeling  of Stress : Not on file  Social Connections:   . Frequency of Communication with Friends and Family: Not on file  . Frequency of Social Gatherings with Friends and Family: Not on file  . Attends Religious Services: Not on file  . Active Member of Clubs or Organizations: Not on file  . Attends Archivist Meetings: Not on file  . Marital Status: Not on file    Family History  Problem Relation Age of Onset  . Colon cancer Sister   . Colon cancer Sister   . Breast cancer Mother   . Lung disease Mother   . Emphysema Father   . Esophageal cancer Neg Hx   . Rectal cancer Neg Hx   . Stomach cancer Neg Hx     Review of Systems  Constitutional: Negative for chills and fever.  Respiratory: Negative for cough, shortness of breath and wheezing.   Cardiovascular: Negative for chest pain, palpitations and leg swelling.  Neurological: Negative for dizziness and headaches.  Psychiatric/Behavioral: Positive for sleep disturbance (controlled). Negative for dysphoric mood. The patient is not nervous/anxious.        Objective:   Vitals:   01/23/20 1402  BP: (!) 142/82  Pulse: (!) 56  Temp: 97.7 F (36.5 C)  SpO2: 97%   BP Readings from Last 3 Encounters:  01/23/20 (!) 142/82  09/06/19 126/78  07/25/19 (!) 152/72   Wt Readings from Last 3 Encounters:  01/23/20 133 lb 8 oz (60.6 kg)  09/06/19 133 lb (60.3 kg)  07/25/19 133 lb (60.3 kg)   Body mass index is 23.65 kg/m.   Physical Exam    Constitutional: Appears well-developed and well-nourished. No distress.  HENT:    Head: Normocephalic and atraumatic.  Neck: Neck supple. No tracheal deviation present. No thyromegaly present.  No cervical lymphadenopathy Cardiovascular: Normal rate, regular rhythm and normal heart sounds.   2/6 systolic murmur heard. No carotid bruit .  No edema Pulmonary/Chest: Effort normal and breath sounds normal. No respiratory distress. No has no wheezes. No rales.  Skin: Skin is warm and dry. Not diaphoretic.  Psychiatric: Normal mood and affect. Behavior is normal.      Assessment & Plan:    See Problem List for Assessment and Plan of chronic medical problems.    This visit occurred during the SARS-CoV-2 public health emergency.  Safety protocols were in place, including screening questions prior to the visit, additional usage of staff PPE, and extensive cleaning of exam room while observing appropriate contact time as indicated for disinfecting solutions.

## 2020-01-22 NOTE — Assessment & Plan Note (Addendum)
Chronic dexa up to date Walking regularly Taking calcium and vitamin D

## 2020-01-23 ENCOUNTER — Encounter: Payer: Self-pay | Admitting: Internal Medicine

## 2020-01-23 ENCOUNTER — Other Ambulatory Visit: Payer: Self-pay

## 2020-01-23 ENCOUNTER — Ambulatory Visit (INDEPENDENT_AMBULATORY_CARE_PROVIDER_SITE_OTHER): Payer: Medicare Other | Admitting: Internal Medicine

## 2020-01-23 VITALS — BP 142/82 | HR 56 | Temp 97.7°F | Ht 63.0 in | Wt 133.5 lb

## 2020-01-23 DIAGNOSIS — G4700 Insomnia, unspecified: Secondary | ICD-10-CM | POA: Diagnosis not present

## 2020-01-23 DIAGNOSIS — M810 Age-related osteoporosis without current pathological fracture: Secondary | ICD-10-CM

## 2020-01-23 DIAGNOSIS — E785 Hyperlipidemia, unspecified: Secondary | ICD-10-CM

## 2020-01-23 DIAGNOSIS — I10 Essential (primary) hypertension: Secondary | ICD-10-CM

## 2020-01-23 DIAGNOSIS — E039 Hypothyroidism, unspecified: Secondary | ICD-10-CM | POA: Diagnosis not present

## 2020-01-23 LAB — COMPREHENSIVE METABOLIC PANEL
ALT: 19 U/L (ref 0–35)
AST: 25 U/L (ref 0–37)
Albumin: 3.9 g/dL (ref 3.5–5.2)
Alkaline Phosphatase: 61 U/L (ref 39–117)
BUN: 21 mg/dL (ref 6–23)
CO2: 31 mEq/L (ref 19–32)
Calcium: 9.1 mg/dL (ref 8.4–10.5)
Chloride: 104 mEq/L (ref 96–112)
Creatinine, Ser: 0.76 mg/dL (ref 0.40–1.20)
GFR: 73.04 mL/min (ref >60.00)
Glucose, Bld: 94 mg/dL (ref 70–99)
Potassium: 4 mEq/L (ref 3.5–5.1)
Sodium: 138 mEq/L (ref 135–145)
Total Bilirubin: 1 mg/dL (ref 0.2–1.2)
Total Protein: 6.5 g/dL (ref 6.0–8.3)

## 2020-01-23 LAB — LIPID PANEL
Cholesterol: 126 mg/dL (ref 0–200)
HDL: 49.6 mg/dL (ref >39.00)
LDL Cholesterol: 62 mg/dL (ref 0–99)
NonHDL: 76.44
Total CHOL/HDL Ratio: 3
Triglycerides: 72 mg/dL (ref 0.0–149.0)
VLDL: 14.4 mg/dL (ref 0.0–40.0)

## 2020-01-23 LAB — CBC WITH DIFFERENTIAL/PLATELET
Basophils Absolute: 0 10*3/uL (ref 0.0–0.1)
Basophils Relative: 0.9 % (ref 0.0–3.0)
Eosinophils Absolute: 0.2 10*3/uL (ref 0.0–0.7)
Eosinophils Relative: 4.3 % (ref 0.0–5.0)
HCT: 39.6 % (ref 36.0–46.0)
Hemoglobin: 13.2 g/dL (ref 12.0–15.0)
Lymphocytes Relative: 35 % (ref 12.0–46.0)
Lymphs Abs: 1.9 10*3/uL (ref 0.7–4.0)
MCHC: 33.4 g/dL (ref 30.0–36.0)
MCV: 90.3 fl (ref 78.0–100.0)
Monocytes Absolute: 0.5 10*3/uL (ref 0.1–1.0)
Monocytes Relative: 9.5 % (ref 3.0–12.0)
Neutro Abs: 2.7 10*3/uL (ref 1.4–7.7)
Neutrophils Relative %: 50.3 % (ref 43.0–77.0)
Platelets: 225 10*3/uL (ref 150.0–400.0)
RBC: 4.38 Mil/uL (ref 3.87–5.11)
RDW: 14.1 % (ref 11.5–15.5)
WBC: 5.3 10*3/uL (ref 4.0–10.5)

## 2020-01-23 LAB — TSH: TSH: 1.38 u[IU]/mL (ref 0.35–4.50)

## 2020-01-23 NOTE — Assessment & Plan Note (Signed)
Chronic BP well controlled Current regimen effective and well tolerated Continue current medications at current doses cmp  

## 2020-01-23 NOTE — Assessment & Plan Note (Signed)
Chronic Taking 1/4 of an ambien nightly ambien effective and no side effects Will continue

## 2020-01-23 NOTE — Telephone Encounter (Signed)
Spoke with pt in regards. We do not have results of COVID testing. She will need to reach out to [facility] where she was tested. Pt expressed understanding.

## 2020-01-23 NOTE — Assessment & Plan Note (Signed)
Chronic Check lipid panel  Continue daily statin Regular exercise and healthy diet encouraged  

## 2020-01-23 NOTE — Assessment & Plan Note (Signed)
Chronic  Clinically euthyroid Check tsh  Titrate med dose if needed  

## 2020-01-24 ENCOUNTER — Encounter: Payer: Self-pay | Admitting: Internal Medicine

## 2020-01-24 DIAGNOSIS — H04202 Unspecified epiphora, left lacrimal gland: Secondary | ICD-10-CM | POA: Diagnosis not present

## 2020-01-24 DIAGNOSIS — H04223 Epiphora due to insufficient drainage, bilateral lacrimal glands: Secondary | ICD-10-CM | POA: Diagnosis not present

## 2020-01-24 DIAGNOSIS — I2510 Atherosclerotic heart disease of native coronary artery without angina pectoris: Secondary | ICD-10-CM | POA: Diagnosis not present

## 2020-01-24 DIAGNOSIS — Z79899 Other long term (current) drug therapy: Secondary | ICD-10-CM | POA: Diagnosis not present

## 2020-01-24 DIAGNOSIS — I10 Essential (primary) hypertension: Secondary | ICD-10-CM | POA: Diagnosis not present

## 2020-01-24 DIAGNOSIS — Z8639 Personal history of other endocrine, nutritional and metabolic disease: Secondary | ICD-10-CM | POA: Diagnosis not present

## 2020-01-24 DIAGNOSIS — H04552 Acquired stenosis of left nasolacrimal duct: Secondary | ICD-10-CM | POA: Diagnosis not present

## 2020-01-24 DIAGNOSIS — H02535 Eyelid retraction left lower eyelid: Secondary | ICD-10-CM | POA: Diagnosis not present

## 2020-01-24 DIAGNOSIS — H02532 Eyelid retraction right lower eyelid: Secondary | ICD-10-CM | POA: Diagnosis not present

## 2020-01-24 DIAGNOSIS — Z7982 Long term (current) use of aspirin: Secondary | ICD-10-CM | POA: Diagnosis not present

## 2020-01-24 DIAGNOSIS — H02135 Senile ectropion of left lower eyelid: Secondary | ICD-10-CM | POA: Diagnosis not present

## 2020-01-24 DIAGNOSIS — H04553 Acquired stenosis of bilateral nasolacrimal duct: Secondary | ICD-10-CM | POA: Diagnosis not present

## 2020-01-24 DIAGNOSIS — H04322 Acute dacryocystitis of left lacrimal passage: Secondary | ICD-10-CM | POA: Diagnosis not present

## 2020-01-24 DIAGNOSIS — H04222 Epiphora due to insufficient drainage, left lacrimal gland: Secondary | ICD-10-CM | POA: Diagnosis not present

## 2020-01-24 DIAGNOSIS — H04123 Dry eye syndrome of bilateral lacrimal glands: Secondary | ICD-10-CM | POA: Diagnosis not present

## 2020-01-24 DIAGNOSIS — G8918 Other acute postprocedural pain: Secondary | ICD-10-CM | POA: Diagnosis not present

## 2020-01-24 DIAGNOSIS — E785 Hyperlipidemia, unspecified: Secondary | ICD-10-CM | POA: Diagnosis not present

## 2020-01-24 DIAGNOSIS — K219 Gastro-esophageal reflux disease without esophagitis: Secondary | ICD-10-CM | POA: Diagnosis not present

## 2020-01-24 DIAGNOSIS — E039 Hypothyroidism, unspecified: Secondary | ICD-10-CM | POA: Diagnosis not present

## 2020-01-24 DIAGNOSIS — H02132 Senile ectropion of right lower eyelid: Secondary | ICD-10-CM | POA: Diagnosis not present

## 2020-01-25 DIAGNOSIS — H04422 Chronic lacrimal canaliculitis of left lacrimal passage: Secondary | ICD-10-CM | POA: Diagnosis not present

## 2020-01-25 DIAGNOSIS — H0489 Other disorders of lacrimal system: Secondary | ICD-10-CM | POA: Diagnosis not present

## 2020-02-07 DIAGNOSIS — H04322 Acute dacryocystitis of left lacrimal passage: Secondary | ICD-10-CM | POA: Diagnosis not present

## 2020-02-07 DIAGNOSIS — H04553 Acquired stenosis of bilateral nasolacrimal duct: Secondary | ICD-10-CM | POA: Diagnosis not present

## 2020-02-07 DIAGNOSIS — H04223 Epiphora due to insufficient drainage, bilateral lacrimal glands: Secondary | ICD-10-CM | POA: Diagnosis not present

## 2020-02-14 ENCOUNTER — Other Ambulatory Visit: Payer: Self-pay | Admitting: Internal Medicine

## 2020-02-29 DIAGNOSIS — H1131 Conjunctival hemorrhage, right eye: Secondary | ICD-10-CM | POA: Diagnosis not present

## 2020-03-03 ENCOUNTER — Ambulatory Visit (INDEPENDENT_AMBULATORY_CARE_PROVIDER_SITE_OTHER): Payer: Medicare Other | Admitting: Cardiovascular Disease

## 2020-03-03 ENCOUNTER — Other Ambulatory Visit: Payer: Self-pay

## 2020-03-03 ENCOUNTER — Encounter: Payer: Self-pay | Admitting: Cardiovascular Disease

## 2020-03-03 VITALS — BP 146/76 | HR 50 | Ht 63.0 in | Wt 133.0 lb

## 2020-03-03 DIAGNOSIS — I350 Nonrheumatic aortic (valve) stenosis: Secondary | ICD-10-CM

## 2020-03-03 DIAGNOSIS — I5032 Chronic diastolic (congestive) heart failure: Secondary | ICD-10-CM | POA: Diagnosis not present

## 2020-03-03 DIAGNOSIS — I2510 Atherosclerotic heart disease of native coronary artery without angina pectoris: Secondary | ICD-10-CM | POA: Diagnosis not present

## 2020-03-03 DIAGNOSIS — E782 Mixed hyperlipidemia: Secondary | ICD-10-CM

## 2020-03-03 DIAGNOSIS — I10 Essential (primary) hypertension: Secondary | ICD-10-CM

## 2020-03-03 NOTE — Progress Notes (Signed)
Cardiology Office Note:    Date:  03/03/2020   ID:  [name], DOB 04/21/1941, MRN [id number]  PCP:  [name], MD  Cardiologist:  [name], MD  Electrophysiologist:  None   Referring MD: [name], MD   Chief Complaint  Patient presents with  . Coronary Artery Disease    History of Present Illness:    [name] is a 79 y.o. female with a hx of coronary artery disease and aortic stenosis presenting for follow-up evaluation.  The patient underwent CABG in 2007.  She had repeat heart catheterization in 2011 when she developed recurrent symptoms of chest discomfort and this demonstrated continued patency of all of her bypass grafts.  She has developed mild to moderate aortic stenosis and has been followed with serial echo and exams.   She is here alone today. She continues to walk about 2 miles per day at a slow pace and has no symptoms associated with this level of activity. Today, she denies symptoms of palpitations, chest pain, shortness of breath, orthopnea, PND, lower extremity edema, dizziness, or syncope.   Past Medical History:  Diagnosis Date  . ALLERGIC RHINITIS   . Allergy    SEASONAL  . Anxiety   . Aortic stenosis, moderate   . Blood transfusion without reported diagnosis   . BREAST CANCER 1991   left, 1991; B mastectomy  . Cataract    BILATERAL-REMOVED  . Coronary artery disease    s/p CABGx5 2007 //Nuclear stress test 10/18: EF 76, no ischemia or infarction, low risk  . Depression   . DIVERTICULOSIS   . DVT   . EROSIVE GASTRITIS   . GERD   . Heart murmur   . History of DVT (deep vein thrombosis)   . HYPERLIPIDEMIA   . Hypertension   . HYPOTHYROIDISM   . Occlusion and stenosis of carotid artery without mention of cerebral infarction   . Osteopenia   . OSTEOPOROSIS   . RHINOSINUSITIS, RECURRENT     Past Surgical History:  Procedure Laterality Date  . APPENDECTOMY    . COLONOSCOPY    . CORONARY ARTERY BYPASS GRAFT     5         2007  . MASTECTOMY     left with reconstruction and lymph node excision  1992  . MASTECTOMY     right with reconstruction  . REVISION RECONSTRUCTED BREAST Left 02/2014   willard (plastics in [place])  . right ankle arthroscopy    . TONSILLECTOMY AND ADENOIDECTOMY    . UPPER GASTROINTESTINAL ENDOSCOPY    . VAGINAL HYSTERECTOMY     ovaries not excised    Current Medications: Current Meds  Medication Sig  . aspirin 81 MG tablet Take 81 mg by mouth daily.    Marland Kitchen atorvastatin (LIPITOR) 20 MG tablet Take 1 tablet by mouth daily.  . Calcium Carbonate (CALTRATE 600 PO) Take 1 tablet by mouth 2 (two) times daily.    . chlorpheniramine (CHLOR-TRIMETON) 4 MG tablet Take 4 mg by mouth daily as needed for allergies.  . Coenzyme Q10 (CO Q-10 PO) Take 50 mg by mouth daily.    . fluticasone (FLONASE) 50 MCG/ACT nasal spray SPRAY 2 SPRAYS INTO EACH NOSTRIL EVERY DAY  . levothyroxine (SYNTHROID) 75 MCG tablet TAKE 1 TABLET BY MOUTH EVERY DAY BEFORE BREAKFAST  . meloxicam (MOBIC) 15 MG tablet TAKE 1 TABLET BY MOUTH EVERY DAY  . metoprolol tartrate (LOPRESSOR) 25 MG tablet TAKE 1 TABLET BY  MOUTH TWICE A DAY  . senna-docusate (SENOKOT-S) 8.6-50 MG per tablet Take 1 tablet by mouth daily.    . sodium chloride (OCEAN) 0.65 % nasal spray sodium chloride 0.65 % nasal spray aerosol  USE 3-4 TIMES PER DAY STARTING DAY AFTER SURGERY AND CONTINUE FOR ONE MONTH  . VITAMIN D, CHOLECALCIFEROL, PO Take by mouth.  . zolpidem (AMBIEN) 10 MG tablet TAKE 1 TABLET BY MOUTH EVERYDAY AT BEDTIME     Allergies:   Clarithromycin, Clindamycin, Codeine, Levofloxacin, Morphine, Naproxen, Penicillins, and Pneumococcal vaccines   Social History   Socioeconomic History  . Marital status: Divorced    Spouse name: Not on file  . Number of children: 2  . Years of education: Not on file  . Highest education level: Not on file  Occupational History  . Occupation: retired  Tobacco Use  . Smoking status: Never Smoker  . Smokeless  tobacco: Never Used  Substance and Sexual Activity  . Alcohol use: No    Alcohol/week: 0.0 standard drinks  . Drug use: No  . Sexual activity: Not Currently  Other Topics Concern  . 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 Difficulty of Paying Living Expenses:   Food Insecurity:   . Worried About Charity fundraiser in the Last Year:   . Arboriculturist in the Last Year:   Transportation Needs:   . Film/video editor (Medical):   Marland Kitchen Lack of Transportation (Non-Medical):   Physical Activity:   . Days of Exercise per Week:   . Minutes of Exercise per Session:   Stress:   . Feeling of Stress :   Social Connections:   . Frequency of Communication with Friends and Family:   . Frequency of Social Gatherings with Friends and Family:   . Attends Religious Services:   . Active Member of Clubs or Organizations:   . Attends Archivist Meetings:   Marland Kitchen Marital Status:      Family History: The patient's family history includes Breast cancer in her mother; Colon cancer in her sister and sister; Emphysema in her father; Lung disease in her mother. There is no history of Esophageal cancer, Rectal cancer, or Stomach cancer.  ROS:   Please see the history of present illness.    All other systems reviewed and are negative.  EKGs/Labs/Other Studies Reviewed:    The following studies were reviewed today: Echo 09-11-2019: IMPRESSIONS    1. Left ventricular ejection fraction, by visual estimation, is 60 to  65%. The left ventricle has normal function. There is no left ventricular  hypertrophy.  2. Left ventricular diastolic Doppler parameters are consistent with  restrictive filling pattern of LV diastolic filling.  3. Global right ventricle has normal systolic function.The right  ventricular size is normal. No  increase in right ventricular wall  thickness.  4. Left atrial size was mild-moderately dilated.  5. Right atrial size was normal.  6. Mild mitral annular calcification.  7. The mitral valve is normal in structure. Mild mitral valve  regurgitation.  8. The tricuspid valve is normal in structure. Tricuspid valve  regurgitation is mild.  9. The aortic valve is abnormal Aortic valve regurgitation is mild by  color flow Doppler. Mild to moderate aortic valve stenosis.  10. There is Mild thickening of  the aortic valve.  11. There is Moderate calcification of the aortic valve.  12. The pulmonic valve was grossly normal. Pulmonic valve regurgitation is  mild to moderate by color flow Doppler.  13. Normal pulmonary artery systolic pressure.  14. The inferior vena cava is normal in size with greater than 50%  respiratory variability, suggesting right atrial pressure of 3 mmHg.   In comparison to the previous echocardiogram(s): Prior examinations were  reviewed in a side by side comparison of images. No significant change to  aortic stenosis.  FINDINGS  Left Ventricle: Left ventricular ejection fraction, by visual estimation,  is 60 to 65%. The left ventricle has normal function. No evidence of left  ventricular regional wall motion abnormalities. There is no left  ventricular hypertrophy. Spectral  Doppler shows Left ventricular diastolic Doppler parameters are consistent  with restrictive filling pattern of LV diastolic filling.   Right Ventricle: The right ventricular size is normal. No increase in  right ventricular wall thickness. Global RV systolic function is has  normal systolic function. The tricuspid regurgitant velocity is 2.38 m/s,  and with an assumed right atrial pressure  of 3 mmHg, the estimated right ventricular systolic pressure is normal at  25.8 mmHg.   Left Atrium: Left atrial size was mild-moderately dilated.   Right Atrium: Right atrial size was normal in  size   Pericardium: There is no evidence of pericardial effusion.   Mitral Valve: The mitral valve is normal in structure. There is mild  thickening of the mitral valve leaflet(s). There is mild calcification of  the mitral valve leaflet(s). Mild mitral annular calcification. Mild  mitral valve regurgitation.   Tricuspid Valve: The tricuspid valve is normal in structure. Tricuspid  valve regurgitation is mild by color flow Doppler.   Aortic Valve: The aortic valve is abnormal. There is Mild thickening and  Moderate calcification of the aortic valve. Aortic valve regurgitation is  mild by color flow Doppler. Aortic regurgitation PHT measures 650 msec.  Mild to moderate aortic stenosis is  present. There is Mild thickening of the aortic valve. Moderate  calcification. Aortic valve mean gradient measures 18.7 mmHg. Aortic valve  peak gradient measures 29.4 mmHg. Aortic valve area, by VTI measures 1.17  cm.   Pulmonic Valve: The pulmonic valve was grossly normal. Pulmonic valve  regurgitation is mild to moderate by color flow Doppler.   Aorta: The aortic root, ascending aorta and aortic arch are all  structurally normal, with no evidence of dilitation or obstruction.   Pulmonary Artery: The pulmonary artery is not well seen.   Venous: The inferior vena cava is normal in size with greater than 50%  respiratory variability, suggesting right atrial pressure of 3 mmHg.   IAS/Shunts: No atrial level shunt detected by color flow Doppler.      LEFT VENTRICLE  PLAX 2D  LVIDd:     3.20 cm Diastology  LVIDs:     2.00 cm LV e' lateral:  6.01 cm/s  LV PW:     1.10 cm LV E/e' lateral: 23.0  LV IVS:    1.10 cm LV e' medial:  5.35 cm/s  LVOT diam:   1.90 cm LV E/e' medial: 25.8  LV SV:     28 ml  LV SV Index:  17.16  LVOT Area:   2.84 cm     RIGHT VENTRICLE  RV Basal diam: 3.30 cm  RV S prime:   13.05 cm/s  TAPSE (M-mode): 2.0 cm   LEFT  ATRIUM       Index    RIGHT ATRIUM      Index  LA diam:    4.00 cm 2.46 cm/m RA Area:   13.70 cm  LA Vol (A2C):  57.3 ml 35.24 ml/m RA Volume:  37.50 ml 23.07 ml/m  LA Vol (A4C):  36.2 ml 22.27 ml/m  LA Biplane Vol: 47.0 ml 28.91 ml/m  AORTIC VALVE  AV Area (Vmax):  1.06 cm  AV Area (Vmean):  1.07 cm  AV Area (VTI):   1.17 cm  AV Vmax:      271.33 cm/s  AV Vmean:     206.333 cm/s  AV VTI:      0.736 m  AV Peak Grad:   29.4 mmHg  AV Mean Grad:   18.7 mmHg  LVOT Vmax:     101.00 cm/s  LVOT Vmean:    77.600 cm/s  LVOT VTI:     0.305 m  LVOT/AV VTI ratio: 0.41  AI PHT:      650 msec    AORTA  Ao Root diam: 2.90 cm  Ao Asc diam: 3.10 cm   MITRAL VALVE             TRICUSPID VALVE  MV Area (PHT): 2.73 cm       TR Peak grad:  22.8 mmHg  MV PHT:    80.62 msec      TR Vmax:    261.00 cm/s  MV Decel Time: 278 msec  MV E velocity: 138.00 cm/s 103 cm/s SHUNTS  MV A velocity: 59.50 cm/s 70.3 cm/s Systemic VTI: 0.30 m  MV E/A ratio: 2.32    1.5    Systemic Diam: 1.90 cm   EKG:  EKG is not ordered today.    Recent Labs: 01/23/2020: ALT 19; BUN 21; Creatinine, Ser 0.76; Hemoglobin 13.2; Platelets 225.0; Potassium 4.0; Sodium 138; TSH 1.38  Recent Lipid Panel    Component Value Date/Time   CHOL 126 01/23/2020 1502   CHOL 134 07/28/2018 1153   TRIG 72.0 01/23/2020 1502   HDL 49.60 01/23/2020 1502   HDL 52 07/28/2018 1153   CHOLHDL 3 01/23/2020 1502   VLDL 14.4 01/23/2020 1502   LDLCALC 62 01/23/2020 1502   LDLCALC 71 07/28/2018 1153    Physical Exam:    VS:  BP (!) 146/76   Pulse (!) 50   Ht 5\' 3"  (1.6 m)   Wt 133 lb (60.3 kg)   SpO2 96%   BMI 23.56 kg/m     Wt Readings from Last 3 Encounters:  03/03/20 133 lb (60.3 kg)  01/23/20 133 lb 8 oz (60.6 kg)  09/06/19 133 lb (60.3 kg)     GEN:  Well nourished, well developed in no acute  distress HEENT: Normal NECK: No JVD; No carotid bruits LYMPHATICS: No lymphadenopathy CARDIAC: RRR, 3/6 harsh early to mid peaking systolic murmur at the RUSB RESPIRATORY:  Clear to auscultation without rales, wheezing or rhonchi  ABDOMEN: Soft, non-tender, non-distended MUSCULOSKELETAL:  No edema; No deformity  SKIN: Warm and dry NEUROLOGIC:  Alert and oriented x 3 PSYCHIATRIC:  Normal affect   ASSESSMENT:    1. Coronary artery disease involving native coronary artery of native heart without angina pectoris   2. Aortic stenosis, moderate   3. Chronic diastolic heart failure ([place])   4. Essential hypertension   5. Mixed hyperlipidemia    PLAN:    In order of problems listed above:  1. Stable on a regimen  of ASA, atorvastatin, and metoprolol. 2. Reviewed most recent echo. Exam and echo show findings consistent with moderate AS.  Recommend echocardiogram at time of her 58-month follow-up visit. 3. LVEF normal - echo reviewed. No cardiac limitations at her current workload. Continue regular exercise, medical therapy. 4. BP controlled on current Rx.  Most recent labs reviewed with a creatinine 0.76, potassium 4.0. 5. Treated with atorvastatin. Recent labs reviewed. Chol 126, HDL 50, LDL 62 mg/dL.    Medication Adjustments/Labs and Tests Ordered: Current medicines are reviewed at length with the patient today.  Concerns regarding medicines are outlined above.  Orders Placed This Encounter  Procedures  . ECHOCARDIOGRAM COMPLETE   No orders of the defined types were placed in this encounter.   Patient Instructions  Medication Instructions:  Your provider recommends that you continue on your current medications as directed. Please refer to the Current Medication list given to you today.   *If you need a refill on your cardiac medications before your next appointment, please call your pharmacy*  Testing/Procedures: Your provider has requested that you have an echocardiogram in 6  months. Echocardiography is a painless test that uses sound waves to create images of your heart. It provides your doctor with information about the size and shape of your heart and how well your heart's chambers and valves are working. This procedure takes approximately one hour. There are no restrictions for this procedure.  Follow-Up: You will be called to arrange your 6 month echo and office visit when Dr. Antionette Char October schedule is available.     Signed, [name], MD  03/03/2020 12:54 PM    [place]

## 2020-03-03 NOTE — Patient Instructions (Signed)
Medication Instructions:  Your provider recommends that you continue on your current medications as directed. Please refer to the Current Medication list given to you today.   *If you need a refill on your cardiac medications before your next appointment, please call your pharmacy*  Testing/Procedures: Your provider has requested that you have an echocardiogram in 6 months. Echocardiography is a painless test that uses sound waves to create images of your heart. It provides your doctor with information about the size and shape of your heart and how well your heart's chambers and valves are working. This procedure takes approximately one hour. There are no restrictions for this procedure.  Follow-Up: You will be called to arrange your 6 month echo and office visit when Dr. Antionette Char October schedule is available.

## 2020-03-21 ENCOUNTER — Other Ambulatory Visit: Payer: Self-pay | Admitting: Internal Medicine

## 2020-03-21 DIAGNOSIS — H04559 Acquired stenosis of unspecified nasolacrimal duct: Secondary | ICD-10-CM | POA: Diagnosis not present

## 2020-03-21 DIAGNOSIS — H04222 Epiphora due to insufficient drainage, left lacrimal gland: Secondary | ICD-10-CM | POA: Diagnosis not present

## 2020-03-24 ENCOUNTER — Other Ambulatory Visit: Payer: Self-pay | Admitting: Internal Medicine

## 2020-03-31 DIAGNOSIS — Z8639 Personal history of other endocrine, nutritional and metabolic disease: Secondary | ICD-10-CM | POA: Diagnosis not present

## 2020-03-31 DIAGNOSIS — H02532 Eyelid retraction right lower eyelid: Secondary | ICD-10-CM | POA: Diagnosis not present

## 2020-03-31 DIAGNOSIS — H04222 Epiphora due to insufficient drainage, left lacrimal gland: Secondary | ICD-10-CM | POA: Diagnosis not present

## 2020-03-31 DIAGNOSIS — H04552 Acquired stenosis of left nasolacrimal duct: Secondary | ICD-10-CM | POA: Diagnosis not present

## 2020-03-31 DIAGNOSIS — H02535 Eyelid retraction left lower eyelid: Secondary | ICD-10-CM | POA: Diagnosis not present

## 2020-04-09 ENCOUNTER — Other Ambulatory Visit: Payer: Self-pay | Admitting: Internal Medicine

## 2020-04-10 NOTE — Telephone Encounter (Signed)
Last OV 01/23/20 Next OV 07/30/20 Last RF 01/03/20

## 2020-04-18 ENCOUNTER — Other Ambulatory Visit: Payer: Self-pay

## 2020-04-18 DIAGNOSIS — H5203 Hypermetropia, bilateral: Secondary | ICD-10-CM | POA: Diagnosis not present

## 2020-04-18 DIAGNOSIS — Z961 Presence of intraocular lens: Secondary | ICD-10-CM | POA: Diagnosis not present

## 2020-04-22 ENCOUNTER — Other Ambulatory Visit: Payer: Self-pay

## 2020-04-22 ENCOUNTER — Encounter: Payer: Self-pay | Admitting: Obstetrics & Gynecology

## 2020-04-22 ENCOUNTER — Encounter: Payer: Medicare Other | Admitting: Obstetrics & Gynecology

## 2020-04-22 ENCOUNTER — Ambulatory Visit (INDEPENDENT_AMBULATORY_CARE_PROVIDER_SITE_OTHER): Payer: Medicare Other | Admitting: Obstetrics & Gynecology

## 2020-04-22 VITALS — BP 122/70 | Ht 63.0 in | Wt 135.6 lb

## 2020-04-22 DIAGNOSIS — M810 Age-related osteoporosis without current pathological fracture: Secondary | ICD-10-CM | POA: Diagnosis not present

## 2020-04-22 DIAGNOSIS — R351 Nocturia: Secondary | ICD-10-CM

## 2020-04-22 DIAGNOSIS — Z9289 Personal history of other medical treatment: Secondary | ICD-10-CM

## 2020-04-22 DIAGNOSIS — Z853 Personal history of malignant neoplasm of breast: Secondary | ICD-10-CM

## 2020-04-22 DIAGNOSIS — Z780 Asymptomatic menopausal state: Secondary | ICD-10-CM | POA: Diagnosis not present

## 2020-04-22 DIAGNOSIS — Z90710 Acquired absence of both cervix and uterus: Secondary | ICD-10-CM

## 2020-04-22 DIAGNOSIS — Z9013 Acquired absence of bilateral breasts and nipples: Secondary | ICD-10-CM

## 2020-04-22 DIAGNOSIS — Z01419 Encounter for gynecological examination (general) (routine) without abnormal findings: Secondary | ICD-10-CM

## 2020-04-22 NOTE — Progress Notes (Signed)
[name] [birth year] [id number]   History:    79 y.o. G2P2L2 Married.  Has grand-children  RP:  Established patient presenting for annual gyn exam   HPI: S/P Total Hysterectomy.No pelvic pain. Normal vaginal secretions. Not sexually active. H/O Left Breast Ca with positive LNs in 1992. S/P Bilateral Mastectomy with reconstruction. Small nodule on the leftchest wall, excised by Dr Dalbert Batman 04/2017, patho benign, fat necrosis with fibrosis.  Urine and bowel movements normal.  Nocturia.  Body mass index 24.02. Walking every day. Health labs with family physician.   Past medical history,surgical history, family history and social history were all reviewed and documented in the EPIC chart.  Gynecologic History No LMP recorded. Patient has had a hysterectomy.   Obstetric History OB History  Gravida Para Term Preterm AB Living  2 2       2   SAB TAB Ectopic Multiple Live Births               # Outcome Date GA Lbr Len/2nd Weight Sex Delivery Anes PTL Lv  2 Para           1 Para              ROS: A ROS was performed and pertinent positives and negatives are included in the history.  GENERAL: No fevers or chills. HEENT: No change in vision, no earache, sore throat or sinus congestion. NECK: No pain or stiffness. CARDIOVASCULAR: No chest pain or pressure. No palpitations. PULMONARY: No shortness of breath, cough or wheeze. GASTROINTESTINAL: No abdominal pain, nausea, vomiting or diarrhea, melena or bright red blood per rectum. GENITOURINARY: No urinary frequency, urgency, hesitancy or dysuria. MUSCULOSKELETAL: No joint or muscle pain, no back pain, no recent trauma. DERMATOLOGIC: No rash, no itching, no lesions. ENDOCRINE: No polyuria, polydipsia, no heat or cold intolerance. No recent change in weight. HEMATOLOGICAL: No anemia or easy bruising or bleeding. NEUROLOGIC: No headache, seizures, numbness, tingling or weakness. PSYCHIATRIC: No depression, no loss of interest in normal  activity or change in sleep pattern.     Exam:   BP 122/70   Ht 5\' 3"  (1.6 m)   Wt 135 lb 9.6 oz (61.5 kg)   BMI 24.02 kg/m   Body mass index is 24.02 kg/m.  General appearance : Well developed well nourished female. No acute distress HEENT: Eyes: no retinal hemorrhage or exudates,  Neck supple, trachea midline, no carotid bruits, no thyroidmegaly Lungs: Clear to auscultation, no rhonchi or wheezes, or rib retractions  Heart: Regular rate and rhythm, no murmurs or gallops Breast:Examined in sitting and supine position were symmetrical in appearance, no palpable masses or tenderness,  no skin retraction, no nipple inversion, no nipple discharge, no skin discoloration, no axillary or supraclavicular lymphadenopathy Abdomen: no palpable masses or tenderness, no rebound or guarding Extremities: no edema or skin discoloration or tenderness  Pelvic: Vulva: Normal             Vagina: No gross lesions or discharge  Cervix/Uterus absent  Adnexa  Without masses or tenderness  Anus: Normal   Assessment/Plan:  79 y.o. female for annual exam   1. Well female exam with routine gynecological exam Gynecologic exam status post total hysterectomy and menopause.  No indication to repeat Pap tests.  History of breast cancer status post bilateral mastectomy with reconstruction.  Colonoscopy 2018.  Health labs with family physician.  Good body mass index at 24.02.  Continue with fitness and healthy nutrition.  2. S/P total  hysterectomy  3. Postmenopause Well on no hormone replacement therapy.  4. Nocturia No symptom of cystitis.  Recommend avoiding hydration in the evening.  Recommend wearing an incontinence protection during the night, such as Depends or a Silhouette.  5. Age-related osteoporosis without current pathological fracture Osteoporosis on bone density June 2020 at the left third radius with a T score of -2.9.  The other sites were improved possibly due to degenerative disease.  Will  continue on vitamin D supplements, calcium intake of 1200 mg daily and regular weightbearing physical activities.  Repeat a bone density in June 2022.  6. Personal history of malignant neoplasm of breast Status post bilateral mastectomy with reconstruction.  7. H/O bilateral mastectomy Status post bilateral mastectomy with reconstruction.  [name] MD, 2:56 PM 04/22/2020

## 2020-04-27 ENCOUNTER — Encounter: Payer: Self-pay | Admitting: Obstetrics & Gynecology

## 2020-04-27 NOTE — Patient Instructions (Signed)
1. Well female exam with routine gynecological exam Gynecologic exam status post total hysterectomy and menopause.  No indication to repeat Pap tests.  History of breast cancer status post bilateral mastectomy with reconstruction.  Colonoscopy 2018.  Health labs with family physician.  Good body mass index at 24.02.  Continue with fitness and healthy nutrition.  2. S/P total hysterectomy  3. Postmenopause Well on no hormone replacement therapy.  4. Nocturia No symptom of cystitis.  Recommend avoiding hydration in the evening.  Recommend wearing an incontinence protection during the night, such as Depends or a Silhouette.  5. Age-related osteoporosis without current pathological fracture Osteoporosis on bone density June 2020 at the left third radius with a T score of -2.9.  The other sites were improved possibly due to degenerative disease.  Will continue on vitamin D supplements, calcium intake of 1200 mg daily and regular weightbearing physical activities.  Repeat a bone density in June 2022.  6. Personal history of malignant neoplasm of breast Status post bilateral mastectomy with reconstruction.  7. H/O bilateral mastectomy Status post bilateral mastectomy with reconstruction.  Blayke, it was a pleasure seeing you today!

## 2020-04-28 DIAGNOSIS — H04222 Epiphora due to insufficient drainage, left lacrimal gland: Secondary | ICD-10-CM | POA: Diagnosis not present

## 2020-04-28 DIAGNOSIS — H02532 Eyelid retraction right lower eyelid: Secondary | ICD-10-CM | POA: Diagnosis not present

## 2020-04-28 DIAGNOSIS — H02535 Eyelid retraction left lower eyelid: Secondary | ICD-10-CM | POA: Diagnosis not present

## 2020-04-28 DIAGNOSIS — H04552 Acquired stenosis of left nasolacrimal duct: Secondary | ICD-10-CM | POA: Diagnosis not present

## 2020-04-28 DIAGNOSIS — Z8639 Personal history of other endocrine, nutritional and metabolic disease: Secondary | ICD-10-CM | POA: Diagnosis not present

## 2020-05-01 ENCOUNTER — Ambulatory Visit (INDEPENDENT_AMBULATORY_CARE_PROVIDER_SITE_OTHER): Payer: Medicare Other | Admitting: Sports Medicine

## 2020-05-01 ENCOUNTER — Other Ambulatory Visit: Payer: Self-pay

## 2020-05-01 DIAGNOSIS — I2510 Atherosclerotic heart disease of native coronary artery without angina pectoris: Secondary | ICD-10-CM

## 2020-05-01 DIAGNOSIS — Q667 Congenital pes cavus, unspecified foot: Secondary | ICD-10-CM | POA: Diagnosis not present

## 2020-05-01 NOTE — Assessment & Plan Note (Signed)
I added heel cushion to both orthotics This returned her gait to neutral She walks without pain Gait slightly supinated  Orthotics are worn We will try to set up a time to make new ones for her to continue use

## 2020-05-01 NOTE — Progress Notes (Signed)
CC: RT foot pain  Patient with an equinovarus cavus foot deformity bilateral and worse on RT I put her in orthotics 6 years ago Since then she can walk and stand with much less foot pain Will not go without them  Now RT orthotic seems more worn and not as supportive She comes to see if I can help repair this  ROS No foot swelling Pain in less than 2 hours without orthotic  PE Pleasant elderly F BP 120/70   Ht 5\' 3"  (1.6 m)   Wt 130 lb (59 kg)   BMI 23.03 kg/m   Severe equinovarus deformity RT great than left Early bunion change on both feet Stance and gait is in supination Good ankle motion No pain to palpation  RT heel is worn down on orthotics but both are still functional

## 2020-05-12 ENCOUNTER — Telehealth: Payer: Self-pay | Admitting: *Deleted

## 2020-05-12 DIAGNOSIS — E041 Nontoxic single thyroid nodule: Secondary | ICD-10-CM

## 2020-05-12 NOTE — Telephone Encounter (Signed)
Patient called back stating she never heard back from the office about a referral to endocrinology. Reports she was seen for annual exam on 04/22/20 and you felt a thyroid nodule on neck and recommend referral. I didn't see anything in chart regarding this. Please advise

## 2020-05-13 NOTE — Telephone Encounter (Signed)
Agree, refer to Endocrinology for Thyroid evaluation for thyroid nodule on exam.

## 2020-05-13 NOTE — Telephone Encounter (Signed)
Patient informed, referral placed at [facility], they will call to schedule.

## 2020-05-15 NOTE — Telephone Encounter (Signed)
Patient 06/23/20 with Dr.Elllison

## 2020-06-23 ENCOUNTER — Ambulatory Visit: Payer: Medicare Other | Admitting: Endocrinology

## 2020-06-25 ENCOUNTER — Other Ambulatory Visit: Payer: Self-pay | Admitting: Internal Medicine

## 2020-06-30 ENCOUNTER — Ambulatory Visit (INDEPENDENT_AMBULATORY_CARE_PROVIDER_SITE_OTHER): Payer: Medicare Other | Admitting: Endocrinology

## 2020-06-30 ENCOUNTER — Encounter: Payer: Self-pay | Admitting: Endocrinology

## 2020-06-30 ENCOUNTER — Other Ambulatory Visit: Payer: Self-pay | Admitting: Endocrinology

## 2020-06-30 ENCOUNTER — Other Ambulatory Visit: Payer: Self-pay

## 2020-06-30 VITALS — BP 124/80 | HR 57 | Ht 63.0 in | Wt 136.0 lb

## 2020-06-30 DIAGNOSIS — E039 Hypothyroidism, unspecified: Secondary | ICD-10-CM

## 2020-06-30 DIAGNOSIS — I2510 Atherosclerotic heart disease of native coronary artery without angina pectoris: Secondary | ICD-10-CM

## 2020-06-30 NOTE — Patient Instructions (Signed)
Let's check the ultrasound.  you will receive a phone call, about a day and time for an appointment.   I would be happy to see you back here as needed.

## 2020-06-30 NOTE — Progress Notes (Signed)
Subjective:    Patient ID: [name], female    DOB: [birth year], 79 y.o.   MRN: [id number]  HPI Pt is referred by Dr Dellis Filbert, for thyroid abnormality.  Pt was noted to have swelling at the ant neck in 2021.  She has had hypothyroidism x many years.  she has no h/o XRT or surgery to the neck.  pt states she feels well in general, except for cold intolerance.   Past Medical History:  Diagnosis Date  . ALLERGIC RHINITIS   . Allergy    SEASONAL  . Anxiety   . Aortic stenosis, moderate   . Blood transfusion without reported diagnosis   . BREAST CANCER 1991   left, 1991; B mastectomy  . Cataract    BILATERAL-REMOVED  . Coronary artery disease    s/p CABGx5 2007 //Nuclear stress test 10/18: EF 76, no ischemia or infarction, low risk  . Depression   . DIVERTICULOSIS   . DVT   . EROSIVE GASTRITIS   . GERD   . Heart murmur   . History of DVT (deep vein thrombosis)   . HYPERLIPIDEMIA   . Hypertension   . HYPOTHYROIDISM   . Occlusion and stenosis of carotid artery without mention of cerebral infarction   . Osteopenia   . OSTEOPOROSIS   . RHINOSINUSITIS, RECURRENT     Past Surgical History:  Procedure Laterality Date  . APPENDECTOMY    . COLONOSCOPY    . CORONARY ARTERY BYPASS GRAFT     5        2007  . MASTECTOMY     left with reconstruction and lymph node excision  1992  . MASTECTOMY     right with reconstruction  . REVISION RECONSTRUCTED BREAST Left 02/2014   willard (plastics in [place])  . right ankle arthroscopy    . TEAR DUCT CANAL    . TONSILLECTOMY AND ADENOIDECTOMY    . UPPER GASTROINTESTINAL ENDOSCOPY    . VAGINAL HYSTERECTOMY     ovaries not excised    Social History   Socioeconomic History  . Marital status: Divorced    Spouse name: Not on file  . Number of children: 2  . Years of education: Not on file  . Highest education level: Not on file  Occupational History  . Occupation: retired  Tobacco Use  . Smoking status: Never Smoker  . Smokeless  tobacco: Never Used  Vaping Use  . Vaping Use: Never used  Substance and Sexual Activity  . Alcohol use: No    Alcohol/week: 0.0 standard drinks  . Drug use: No  . Sexual activity: Not Currently  Other Topics Concern  . 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 Difficulty of Paying Living Expenses: Not on file  Food Insecurity:   . Worried About Charity fundraiser in the Last Year: Not on file  . Ran Out of Food in the Last Year: Not on file  Transportation Needs:   . Lack of Transportation (Medical): Not on file  . Lack of Transportation (Non-Medical): Not on file  Physical Activity:   . Days of Exercise per Week: Not on file  . Minutes of Exercise per Session: Not on file  Stress:   . Feeling of Stress : Not on file  Social Connections:   .  Frequency of Communication with Friends and Family: Not on file  . Frequency of Social Gatherings with Friends and Family: Not on file  . Attends Religious Services: Not on file  . Active Member of Clubs or Organizations: Not on file  . Attends Archivist Meetings: Not on file  . Marital Status: Not on file  Intimate Partner Violence:   . Fear of Current or Ex-Partner: Not on file  . Emotionally Abused: Not on file  . Physically Abused: Not on file  . Sexually Abused: Not on file    Current Outpatient Medications on File Prior to Visit  Medication Sig Dispense Refill  . aspirin 81 MG tablet Take 81 mg by mouth daily.      Marland Kitchen atorvastatin (LIPITOR) 20 MG tablet TAKE 1 TABLET BY MOUTH EVERY DAY 90 tablet 1  . Calcium Carbonate (CALTRATE 600 PO) Take 1 tablet by mouth 2 (two) times daily.      . Coenzyme Q10 (CO Q-10 PO) Take 50 mg by mouth daily.      Marland Kitchen levothyroxine (SYNTHROID) 75 MCG tablet TAKE 1 TABLET BY MOUTH EVERY DAY BEFORE BREAKFAST 90 tablet 1  .  meloxicam (MOBIC) 15 MG tablet TAKE 1 TABLET BY MOUTH EVERY DAY (Patient taking differently: Take 3.75 mg by mouth daily. ) 90 tablet 0  . metoprolol tartrate (LOPRESSOR) 25 MG tablet TAKE 1 TABLET BY MOUTH TWICE A DAY 180 tablet 1  . MULTIPLE VITAMIN PO Take 1 tablet by mouth daily.    . pantoprazole (PROTONIX) 40 MG tablet Take 40 mg by mouth daily.    Marland Kitchen senna-docusate (SENOKOT-S) 8.6-50 MG per tablet Take 1 tablet by mouth daily.      Marland Kitchen VITAMIN D, CHOLECALCIFEROL, PO Take by mouth.    . zolpidem (AMBIEN) 10 MG tablet TAKE 1 TABLET BY MOUTH EVERYDAY AT BEDTIME (Patient taking differently: Take 5 mg by mouth at bedtime. ) 30 tablet 0   No current facility-administered medications on file prior to visit.    Allergies  Allergen Reactions  . Clarithromycin Other (See Comments)    PATIENT UNSURE OF REACTION  . Clindamycin Other (See Comments)  . Codeine     REACTION: nausea  . Levofloxacin     REACTION: nausea  . Morphine Other (See Comments)    Nausea  Nausea   . Naproxen     REACTION: nausea  . Penicillins Other (See Comments) and Rash    REACTION: RASH REACTION: RASH   . Pneumococcal Vaccines Rash    Small rash on arm at injection site.     Family History  Problem Relation Age of Onset  . Colon cancer Sister   . Colon cancer Sister   . Breast cancer Mother   . Lung disease Mother   . Emphysema Father   . Esophageal cancer Neg Hx   . Rectal cancer Neg Hx   . Stomach cancer Neg Hx   . Thyroid disease Neg Hx     BP 124/80   Pulse (!) 57   Ht 5\' 3"  (1.6 m)   Wt 136 lb (61.7 kg)   SpO2 98%   BMI 24.09 kg/m    Review of Systems denies depression, hair loss, muscle cramps, weight gain, memory loss, constipation, numbness, and dry skin.      Objective:   Physical Exam VITAL SIGNS:  See vs page GENERAL: no distress NECK: There is no palpable thyroid enlargement.  No thyroid nodule is palpable.  No palpable lymphadenopathy  at the anterior neck.    Lab Results   Component Value Date   TSH 1.38 01/23/2020   I have reviewed outside records, and summarized:  Pt was noted to have goiter, and referred here.  Wellness, nocturia, and osteoporosis were also addressed.       Assessment & Plan:  Goiter, new to me, uncertain etiology.  Chronic primary hypothyroidism: well-replaced.  Please continue the same medication.    Patient Instructions  Let's check the ultrasound.  you will receive a phone call, about a day and time for an appointment.   I would be happy to see you back here as needed.

## 2020-07-29 ENCOUNTER — Other Ambulatory Visit: Payer: Medicare Other

## 2020-07-30 ENCOUNTER — Other Ambulatory Visit: Payer: Self-pay

## 2020-07-30 ENCOUNTER — Encounter: Payer: Self-pay | Admitting: Internal Medicine

## 2020-07-30 ENCOUNTER — Ambulatory Visit (INDEPENDENT_AMBULATORY_CARE_PROVIDER_SITE_OTHER): Payer: Medicare Other | Admitting: Internal Medicine

## 2020-07-30 VITALS — BP 158/82 | HR 61 | Temp 97.6°F | Wt 135.8 lb

## 2020-07-30 DIAGNOSIS — Z23 Encounter for immunization: Secondary | ICD-10-CM | POA: Diagnosis not present

## 2020-07-30 DIAGNOSIS — M545 Low back pain, unspecified: Secondary | ICD-10-CM | POA: Insufficient documentation

## 2020-07-30 DIAGNOSIS — M810 Age-related osteoporosis without current pathological fracture: Secondary | ICD-10-CM

## 2020-07-30 DIAGNOSIS — E039 Hypothyroidism, unspecified: Secondary | ICD-10-CM

## 2020-07-30 DIAGNOSIS — E7849 Other hyperlipidemia: Secondary | ICD-10-CM

## 2020-07-30 DIAGNOSIS — G4709 Other insomnia: Secondary | ICD-10-CM

## 2020-07-30 DIAGNOSIS — I2510 Atherosclerotic heart disease of native coronary artery without angina pectoris: Secondary | ICD-10-CM

## 2020-07-30 DIAGNOSIS — I10 Essential (primary) hypertension: Secondary | ICD-10-CM

## 2020-07-30 NOTE — Patient Instructions (Addendum)
°  Blood work was ordered.  An xray was ordered.  Flu immunization administered today.    Medications reviewed and updated.  Changes include :   none   A referral was ordered for physical therapy.     Someone from their office will call you to schedule an appointment.    Please followup in 6 months

## 2020-07-30 NOTE — Assessment & Plan Note (Signed)
Chronic Check lipid panel  Continue daily statin Regular exercise and healthy diet encouraged  

## 2020-07-30 NOTE — Assessment & Plan Note (Signed)
Chronic Taking 1/4 of an ambien Controlled continue above

## 2020-07-30 NOTE — Assessment & Plan Note (Signed)
Chronic DEXA due next summer She does walk regularly-not currently walking because of back pain, but typically walks 2 miles a day Continue calcium and vitamin D

## 2020-07-30 NOTE — Assessment & Plan Note (Signed)
Acute Started about 1 week ago without injury Pain localized to left SI joint Continue Tylenol, heat, ice Will refer for PT X-ray today

## 2020-07-30 NOTE — Assessment & Plan Note (Signed)
Chronic  Clinically euthyroid Check tsh  Titrate med dose if needed  

## 2020-07-30 NOTE — Assessment & Plan Note (Signed)
Chronic Blood pressure elevated here today, but she had a lot of stress finding the building-typically better controlled Continue current medications CMP

## 2020-07-30 NOTE — Progress Notes (Signed)
Subjective:    Patient ID: [name], female    DOB: Jul 03, 1941, 79 y.o.   MRN: [id number]  HPI The patient is here for follow up of their chronic medical problems, including htn, hyperlipidemia, hypothyroidism, OP, insomnia  She is taking all of her medications as prescribed.    She walks regularly.  She walks 2 miles a day.    For the past week she had back pain.  She has not been walking.  She has been using a heating pad.  She denies injury.  Increased pain with sitting.  Pain is in Left lower back.  No pain in leg or N/T. She is taking tylenol and it helps.      Medications and allergies reviewed with patient and updated if appropriate.  Patient Active Problem List   Diagnosis Date Noted  . Cavus deformity of foot 05/01/2020  . Poor balance 05/10/2018  . Left ventricular diastolic dysfunction 18/56/3149  . Essential hypertension 07/27/2017  . Fall 03/02/2017  . Osteoarthritis 02/16/2017  . Urinary incontinence 02/16/2017  . Chronic venous insufficiency 08/20/2016  . Xiphoid pain 03/05/2016  . Insomnia 01/16/2016  . Seasonal and perennial allergic rhinitis 11/24/2015  . Asthma, mild intermittent, well-controlled 11/24/2015  . Carotid artery disease ([place]) 06/29/2011  . Hypothyroidism 11/11/2010  . Hyperlipidemia 11/11/2010  . Senile osteoporosis 11/11/2010  . RHINOSINUSITIS, RECURRENT 10/29/2008  . Personal history of malignant neoplasm of breast 03/27/2008  . DIVERTICULOSIS 03/27/2008  . CAD (coronary artery disease) 08/23/2007  . Aortic stenosis, moderate 08/23/2007  . DVT 08/23/2007    Current Outpatient Medications on File Prior to Visit  Medication Sig Dispense Refill  . aspirin 81 MG tablet Take 81 mg by mouth daily.      Marland Kitchen atorvastatin (LIPITOR) 20 MG tablet TAKE 1 TABLET BY MOUTH EVERY DAY 90 tablet 1  . Calcium Carbonate (CALTRATE 600 PO) Take 1 tablet by mouth 2 (two) times daily.      . Coenzyme Q10 (CO Q-10 PO) Take 50 mg by mouth daily.      Marland Kitchen  levothyroxine (SYNTHROID) 75 MCG tablet TAKE 1 TABLET BY MOUTH EVERY DAY BEFORE BREAKFAST 90 tablet 1  . meloxicam (MOBIC) 15 MG tablet TAKE 1 TABLET BY MOUTH EVERY DAY (Patient taking differently: Take 3.75 mg by mouth daily. ) 90 tablet 0  . metoprolol tartrate (LOPRESSOR) 25 MG tablet TAKE 1 TABLET BY MOUTH TWICE A DAY 180 tablet 1  . MULTIPLE VITAMIN PO Take 1 tablet by mouth daily.    Marland Kitchen senna-docusate (SENOKOT-S) 8.6-50 MG per tablet Take 1 tablet by mouth daily.      Marland Kitchen VITAMIN D, CHOLECALCIFEROL, PO Take by mouth.    . zolpidem (AMBIEN) 10 MG tablet TAKE 1 TABLET BY MOUTH EVERYDAY AT BEDTIME (Patient taking differently: Take 5 mg by mouth at bedtime. ) 30 tablet 0   No current facility-administered medications on file prior to visit.    Past Medical History:  Diagnosis Date  . ALLERGIC RHINITIS   . Allergy    SEASONAL  . Anxiety   . Aortic stenosis, moderate   . Blood transfusion without reported diagnosis   . BREAST CANCER 1991   left, 1991; B mastectomy  . Cataract    BILATERAL-REMOVED  . Coronary artery disease    s/p CABGx5 2007 //Nuclear stress test 10/18: EF 76, no ischemia or infarction, low risk  . Depression   . DIVERTICULOSIS   . DVT   . EROSIVE GASTRITIS   .  GERD   . Heart murmur   . History of DVT (deep vein thrombosis)   . HYPERLIPIDEMIA   . Hypertension   . HYPOTHYROIDISM   . Occlusion and stenosis of carotid artery without mention of cerebral infarction   . Osteopenia   . OSTEOPOROSIS   . RHINOSINUSITIS, RECURRENT     Past Surgical History:  Procedure Laterality Date  . APPENDECTOMY    . COLONOSCOPY    . CORONARY ARTERY BYPASS GRAFT     5        2007  . MASTECTOMY     left with reconstruction and lymph node excision  1992  . MASTECTOMY     right with reconstruction  . REVISION RECONSTRUCTED BREAST Left 02/2014   willard (plastics in [place])  . right ankle arthroscopy    . TEAR DUCT CANAL    . TONSILLECTOMY AND ADENOIDECTOMY    . UPPER  GASTROINTESTINAL ENDOSCOPY    . VAGINAL HYSTERECTOMY     ovaries not excised    Social History   Socioeconomic History  . Marital status: Divorced    Spouse name: Not on file  . Number of children: 2  . Years of education: Not on file  . Highest education level: Not on file  Occupational History  . Occupation: retired  Tobacco Use  . Smoking status: Never Smoker  . Smokeless tobacco: Never Used  Vaping Use  . Vaping Use: Never used  Substance and Sexual Activity  . Alcohol use: No    Alcohol/week: 0.0 standard drinks  . Drug use: No  . Sexual activity: Not Currently  Other Topics Concern  . 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 Difficulty of Paying Living Expenses: Not on file  Food Insecurity:   . Worried About Charity fundraiser in the Last Year: Not on file  . Ran Out of Food in the Last Year: Not on file  Transportation Needs:   . Lack of Transportation (Medical): Not on file  . Lack of Transportation (Non-Medical): Not on file  Physical Activity:   . Days of Exercise per Week: Not on file  . Minutes of Exercise per Session: Not on file  Stress:   . Feeling of Stress : Not on file  Social Connections:   . Frequency of Communication with Friends and Family: Not on file  . Frequency of Social Gatherings with Friends and Family: Not on file  . Attends Religious Services: Not on file  . Active Member of Clubs or Organizations: Not on file  . Attends Archivist Meetings: Not on file  . Marital Status: Not on file    Family History  Problem Relation Age of Onset  . Colon cancer Sister   . Colon cancer Sister   . Breast cancer Mother   . Lung disease Mother   . Emphysema Father   . Esophageal cancer Neg Hx   . Rectal cancer Neg Hx   . Stomach cancer Neg Hx   . Thyroid disease Neg Hx       Review of Systems  Constitutional: Negative for chills and fever.  Respiratory: Negative for cough, shortness of breath and wheezing.   Cardiovascular: Negative for chest pain, palpitations and leg swelling.  Gastrointestinal: Negative for abdominal pain and nausea.  Genitourinary:  Positive for frequency.  Neurological: Negative for light-headedness and headaches.       Objective:   Vitals:   07/30/20 1544  BP: (!) 158/82  Pulse: 61  Temp: 97.6 F (36.4 C)  SpO2: 97%   BP Readings from Last 3 Encounters:  07/30/20 (!) 158/82  06/30/20 124/80  05/01/20 120/70   Wt Readings from Last 3 Encounters:  07/30/20 135 lb 12.8 oz (61.6 kg)  06/30/20 136 lb (61.7 kg)  05/01/20 130 lb (59 kg)   Body mass index is 24.06 kg/m.   Physical Exam    Constitutional: Appears well-developed and well-nourished. No distress.  HENT:  Head: Normocephalic and atraumatic.  Neck: Neck supple. No tracheal deviation present. No thyromegaly present.  No cervical lymphadenopathy Cardiovascular: Normal rate, regular rhythm and normal heart sounds.   2/6 systolic murmur heard. No carotid bruit .  No edema Pulmonary/Chest: Effort normal and breath sounds normal. No respiratory distress. No has no wheezes. No rales. Musculoskeletal: No lumbar spine or thoracic spine tenderness with palpation.  Tenderness with palpation of left SI joint and slightly above and psoas region Neurological: Grossly normal sensation and strength in lower extremities Skin: Skin is warm and dry. Not diaphoretic.  Psychiatric: Normal mood and affect. Behavior is normal.      Assessment & Plan:    See Problem List for Assessment and Plan of chronic medical problems.    This visit occurred during the SARS-CoV-2 public health emergency.  Safety protocols were in place, including screening questions prior to the visit, additional usage of staff PPE, and extensive cleaning of exam room while observing appropriate contact  time as indicated for disinfecting solutions.

## 2020-07-31 ENCOUNTER — Telehealth: Payer: Self-pay | Admitting: Internal Medicine

## 2020-07-31 ENCOUNTER — Ambulatory Visit: Payer: Self-pay

## 2020-07-31 ENCOUNTER — Ambulatory Visit (INDEPENDENT_AMBULATORY_CARE_PROVIDER_SITE_OTHER)
Admission: RE | Admit: 2020-07-31 | Discharge: 2020-07-31 | Disposition: A | Discharge status: Home / Self Care | Payer: Medicare Other | Source: Ambulatory Visit | Attending: Internal Medicine | Admitting: Internal Medicine

## 2020-07-31 ENCOUNTER — Encounter: Payer: Self-pay | Admitting: Internal Medicine

## 2020-07-31 DIAGNOSIS — M545 Low back pain, unspecified: Secondary | ICD-10-CM

## 2020-07-31 DIAGNOSIS — M533 Sacrococcygeal disorders, not elsewhere classified: Secondary | ICD-10-CM | POA: Diagnosis not present

## 2020-07-31 DIAGNOSIS — Q7649 Other congenital malformations of spine, not associated with scoliosis: Secondary | ICD-10-CM | POA: Diagnosis not present

## 2020-07-31 LAB — COMPLETE METABOLIC PANEL WITH GFR
AG Ratio: 1.8 (calc) (ref 1.0–2.5)
ALT: 17 U/L (ref 6–29)
AST: 23 U/L (ref 10–35)
Albumin: 4.1 g/dL (ref 3.6–5.1)
Alkaline phosphatase (APISO): 69 U/L (ref 37–153)
BUN: 19 mg/dL (ref 7–25)
CO2: 28 mmol/L (ref 20–32)
Calcium: 9.3 mg/dL (ref 8.6–10.4)
Chloride: 104 mmol/L (ref 98–110)
Creat: 0.68 mg/dL (ref 0.60–0.88)
GFR, Est African American: 95 mL/min/{1.73_m2} (ref >=60)
GFR, Est Non African American: 82 mL/min/{1.73_m2} (ref >=60)
Globulin: 2.3 g/dL (calc) (ref 1.9–3.7)
Glucose, Bld: 99 mg/dL (ref 65–99)
Potassium: 4.3 mmol/L (ref 3.5–5.3)
Sodium: 138 mmol/L (ref 135–146)
Total Bilirubin: 0.8 mg/dL (ref 0.2–1.2)
Total Protein: 6.4 g/dL (ref 6.1–8.1)

## 2020-07-31 LAB — LIPID PANEL
Cholesterol: 144 mg/dL (ref <200)
HDL: 53 mg/dL (ref >=50)
LDL Cholesterol (Calc): 71 mg/dL (calc)
Non-HDL Cholesterol (Calc): 91 mg/dL (calc) (ref <130)
Total CHOL/HDL Ratio: 2.7 (calc) (ref <5.0)
Triglycerides: 114 mg/dL (ref <150)

## 2020-07-31 LAB — TSH: TSH: 2.29 mIU/L (ref 0.40–4.50)

## 2020-07-31 LAB — VITAMIN D 25 HYDROXY (VIT D DEFICIENCY, FRACTURES): Vit D, 25-Hydroxy: 56 ng/mL (ref 30–100)

## 2020-07-31 MED ORDER — TRAMADOL HCL 50 MG PO TABS: 50.0000 mg | ORAL_TABLET | Freq: Three times a day (TID) | ORAL | 0 refills | Status: DC | PRN

## 2020-07-31 NOTE — Telephone Encounter (Signed)
Patient called back and has been informed of information below.

## 2020-07-31 NOTE — Telephone Encounter (Signed)
Patient is calling back and now requesting a strong pain medication to be called in.  [id number] - [place], [place] - [address] [place] Phone:  [phone]  Fax:  [phone]

## 2020-07-31 NOTE — Telephone Encounter (Signed)
Lets try tramadol 50 mg three times daily if needed.  She should take 1000 mg of tylenol with each dose of tramadol.  rx sent to pharmacy.

## 2020-07-31 NOTE — Telephone Encounter (Signed)
Pt calling wanting to know dosing schedule for Tylenol.  Instructed that she can take Tylenol 1000mg  q6hrs as needed; also advised to alternate use of heat & ice for 54mins each vs using heating pad for long periods of time.  Pt informed that she has order in place for x-ray & is encouraged to go to [place] to have that done today as it will help [name] in recommending next steps (if any additional).  Pt verb understanding.

## 2020-07-31 NOTE — Addendum Note (Signed)
Addended by: [name] on: 07/31/2020 03:57 PM   Modules accepted: Orders

## 2020-08-01 ENCOUNTER — Encounter: Payer: Self-pay | Admitting: Internal Medicine

## 2020-08-01 DIAGNOSIS — M545 Low back pain, unspecified: Secondary | ICD-10-CM

## 2020-08-04 ENCOUNTER — Telehealth: Payer: Self-pay | Admitting: Internal Medicine

## 2020-08-04 NOTE — Telephone Encounter (Signed)
    Please call to discuss imaging results

## 2020-08-06 NOTE — Telephone Encounter (Signed)
Patient prefers to PT with Noel Gerold at [place] [address] [phone]

## 2020-08-07 NOTE — Telephone Encounter (Signed)
I will not have time to call her today or tomorrow. Can she send me a note via mychart?

## 2020-08-08 NOTE — Telephone Encounter (Signed)
Called pt gave MD your response. She states she actually got your msg off of mychart concerning the xrays, and she called bck to let MD know that she would like the PT referral, and want to see Noel Gerold. Inform pt MD has entered the referral and once it get set up someone will call to inform her of appt date and time.Marland KitchenJohny Chess

## 2020-08-13 ENCOUNTER — Other Ambulatory Visit: Payer: Self-pay | Admitting: Internal Medicine

## 2020-08-13 ENCOUNTER — Ambulatory Visit
Admission: RE | Admit: 2020-08-13 | Discharge: 2020-08-13 | Disposition: A | Discharge status: Home / Self Care | Payer: Medicare Other | Source: Ambulatory Visit | Attending: Endocrinology | Admitting: Endocrinology

## 2020-08-13 DIAGNOSIS — E039 Hypothyroidism, unspecified: Secondary | ICD-10-CM | POA: Diagnosis not present

## 2020-08-19 DIAGNOSIS — Z23 Encounter for immunization: Secondary | ICD-10-CM | POA: Diagnosis not present

## 2020-08-27 ENCOUNTER — Other Ambulatory Visit: Payer: Self-pay

## 2020-08-27 ENCOUNTER — Ambulatory Visit (INDEPENDENT_AMBULATORY_CARE_PROVIDER_SITE_OTHER): Payer: Medicare Other

## 2020-08-27 VITALS — BP 124/70 | HR 58 | Temp 98.0°F | Resp 16 | Ht 63.0 in | Wt 134.0 lb

## 2020-08-27 DIAGNOSIS — Z0000 Encounter for general adult medical examination without abnormal findings: Secondary | ICD-10-CM

## 2020-08-27 NOTE — Patient Instructions (Signed)
[name] , Thank you for taking time to come for your Medicare Wellness Visit. I appreciate your ongoing commitment to your health goals. Please review the following plan we discussed and let me know if I can assist you in the future.   Screening recommendations/referrals: Colonoscopy: Not a candidate due to age 79: Not a candidate due to age  Bone Density: 04/25/2019; due every 2 years Recommended yearly ophthalmology/optometry visit for glaucoma screening and checkup Recommended yearly dental visit for hygiene and checkup  Vaccinations: Influenza vaccine: 07/30/2020 Pneumococcal vaccine: completed Tdap vaccine: never done Shingles vaccine: completed   Covid-19: completed  Advanced directives: Please bring a copy of your health care power of attorney and living will to the office at your convenience.  Conditions/risks identified: Yes; Reviewed health maintenance screenings with patient today and relevant education, vaccines, and/or referrals were provided. Please continue to do your personal lifestyle choices by: daily care of teeth and gums, regular physical activity (goal should be 5 days a week for 30 minutes), eat a healthy diet, avoid tobacco and drug use, limiting any alcohol intake, taking a low-dose aspirin (if not allergic or have been advised by your provider otherwise) and taking vitamins and minerals as recommended by your provider. Continue doing brain stimulating activities (puzzles, reading, adult coloring books, staying active) to keep memory sharp. Continue to eat heart healthy diet (full of fruits, vegetables, whole grains, lean protein, water--limit salt, fat, and sugar intake) and increase physical activity as tolerated.  Next appointment: Please schedule your next Medicare Wellness Visit in 1 year.   Preventive Care 28 Years and Older, Female Preventive care refers to lifestyle choices and visits with your health care provider that can promote health and  wellness. What does preventive care include?  A yearly physical exam. This is also called an annual well check.  Dental exams once or twice a year.  Routine eye exams. Ask your health care provider how often you should have your eyes checked.  Personal lifestyle choices, including:  Daily care of your teeth and gums.  Regular physical activity.  Eating a healthy diet.  Avoiding tobacco and drug use.  Limiting alcohol use.  Practicing safe sex.  Taking low-dose aspirin every day.  Taking vitamin and mineral supplements as recommended by your health care provider. What happens during an annual well check? The services and screenings done by your health care provider during your annual well check will depend on your age, overall health, lifestyle risk factors, and family history of disease. Counseling  Your health care provider may ask you questions about your:  Alcohol use.  Tobacco use.  Drug use.  Emotional well-being.  Home and relationship well-being.  Sexual activity.  Eating habits.  History of falls.  Memory and ability to understand (cognition).  Work and work Statistician.  Reproductive health. Screening  You may have the following tests or measurements:  Height, weight, and BMI.  Blood pressure.  Lipid and cholesterol levels. These may be checked every 5 years, or more frequently if you are over 68 years old.  Skin check.  Lung cancer screening. You may have this screening every year starting at age 24 if you have a 30-pack-year history of smoking and currently smoke or have quit within the past 15 years.  Fecal occult blood test (FOBT) of the stool. You may have this test every year starting at age 35.  Flexible sigmoidoscopy or colonoscopy. You may have a sigmoidoscopy every 5 years or a colonoscopy every  10 years starting at age 47.  Hepatitis C blood test.  Hepatitis B blood test.  Sexually transmitted disease (STD)  testing.  Diabetes screening. This is done by checking your blood sugar (glucose) after you have not eaten for a while (fasting). You may have this done every 1-3 years.  Bone density scan. This is done to screen for osteoporosis. You may have this done starting at age 98.  Mammogram. This may be done every 1-2 years. Talk to your health care provider about how often you should have regular mammograms. Talk with your health care provider about your test results, treatment options, and if necessary, the need for more tests. Vaccines  Your health care provider may recommend certain vaccines, such as:  Influenza vaccine. This is recommended every year.  Tetanus, diphtheria, and acellular pertussis (Tdap, Td) vaccine. You may need a Td booster every 10 years.  Zoster vaccine. You may need this after age 11.  Pneumococcal 13-valent conjugate (PCV13) vaccine. One dose is recommended after age 20.  Pneumococcal polysaccharide (PPSV23) vaccine. One dose is recommended after age 13. Talk to your health care provider about which screenings and vaccines you need and how often you need them. This information is not intended to replace advice given to you by your health care provider. Make sure you discuss any questions you have with your health care provider. Document Released: 11/28/2015 Document Revised: 07/21/2016 Document Reviewed: 09/02/2015 Elsevier Interactive Patient Education  2017 [place] Prevention in the Home Falls can cause injuries. They can happen to people of all ages. There are many things you can do to make your home safe and to help prevent falls. What can I do on the outside of my home?  Regularly fix the edges of walkways and driveways and fix any cracks.  Remove anything that might make you trip as you walk through a door, such as a raised step or threshold.  Trim any bushes or trees on the path to your home.  Use bright outdoor lighting.  Clear any walking  paths of anything that might make someone trip, such as rocks or tools.  Regularly check to see if handrails are loose or broken. Make sure that both sides of any steps have handrails.  Any raised decks and porches should have guardrails on the edges.  Have any leaves, snow, or ice cleared regularly.  Use sand or salt on walking paths during winter.  Clean up any spills in your garage right away. This includes oil or grease spills. What can I do in the bathroom?  Use night lights.  Install grab bars by the toilet and in the tub and shower. Do not use towel bars as grab bars.  Use non-skid mats or decals in the tub or shower.  If you need to sit down in the shower, use a plastic, non-slip stool.  Keep the floor dry. Clean up any water that spills on the floor as soon as it happens.  Remove soap buildup in the tub or shower regularly.  Attach bath mats securely with double-sided non-slip rug tape.  Do not have throw rugs and other things on the floor that can make you trip. What can I do in the bedroom?  Use night lights.  Make sure that you have a light by your bed that is easy to reach.  Do not use any sheets or blankets that are too big for your bed. They should not hang down onto the floor.  Have a firm chair that has side arms. You can use this for support while you get dressed.  Do not have throw rugs and other things on the floor that can make you trip. What can I do in the kitchen?  Clean up any spills right away.  Avoid walking on wet floors.  Keep items that you use a lot in easy-to-reach places.  If you need to reach something above you, use a strong step stool that has a grab bar.  Keep electrical cords out of the way.  Do not use floor polish or wax that makes floors slippery. If you must use wax, use non-skid floor wax.  Do not have throw rugs and other things on the floor that can make you trip. What can I do with my stairs?  Do not leave any items  on the stairs.  Make sure that there are handrails on both sides of the stairs and use them. Fix handrails that are broken or loose. Make sure that handrails are as long as the stairways.  Check any carpeting to make sure that it is firmly attached to the stairs. Fix any carpet that is loose or worn.  Avoid having throw rugs at the top or bottom of the stairs. If you do have throw rugs, attach them to the floor with carpet tape.  Make sure that you have a light switch at the top of the stairs and the bottom of the stairs. If you do not have them, ask someone to add them for you. What else can I do to help prevent falls?  Wear shoes that:  Do not have high heels.  Have rubber bottoms.  Are comfortable and fit you well.  Are closed at the toe. Do not wear sandals.  If you use a stepladder:  Make sure that it is fully opened. Do not climb a closed stepladder.  Make sure that both sides of the stepladder are locked into place.  Ask someone to hold it for you, if possible.  Clearly mark and make sure that you can see:  Any grab bars or handrails.  First and last steps.  Where the edge of each step is.  Use tools that help you move around (mobility aids) if they are needed. These include:  Canes.  Walkers.  Scooters.  Crutches.  Turn on the lights when you go into a dark area. Replace any light bulbs as soon as they burn out.  Set up your furniture so you have a clear path. Avoid moving your furniture around.  If any of your floors are uneven, fix them.  If there are any pets around you, be aware of where they are.  Review your medicines with your doctor. Some medicines can make you feel dizzy. This can increase your chance of falling. Ask your doctor what other things that you can do to help prevent falls. This information is not intended to replace advice given to you by your health care provider. Make sure you discuss any questions you have with your health care  provider. Document Released: 08/28/2009 Document Revised: 04/08/2016 Document Reviewed: 12/06/2014 Elsevier Interactive Patient Education  2017 [employer].

## 2020-08-27 NOTE — Progress Notes (Signed)
Subjective:   [name] is a 79 y.o. female who presents for Medicare Annual (Subsequent) preventive examination.  Review of Systems    NO ROS. Medicare Wellness Visit. Cardiac Risk Factors include: advanced age (>75men, >38 women);dyslipidemia;hypertension     Objective:    Today's Vitals   08/27/20 1451  BP: 124/70  Pulse: (!) 58  Resp: 16  Temp: 98 F (36.7 C)  SpO2: 97%  Weight: 134 lb (60.8 kg)  Height: 5\' 3"  (1.6 m)  PainSc: 0-No pain   Body mass index is 23.74 kg/m.  Advanced Directives 08/27/2020 07/30/2019 07/19/2018 07/13/2017 03/30/2017 08/20/2016 01/17/2016  Does Patient Have a Medical Advance Directive? Yes Yes Yes Yes Yes Yes No  Type of Paramedic of [place];Living will [place];Living will [place];Living will [place];Living will [place];Living will [place];Living will -  Does patient want to make changes to medical advance directive? No - Patient declined - - - - - -  Copy of [place] in Chart? No - copy requested No - copy requested No - copy requested No - copy requested - - -  Would patient like information on creating a medical advance directive? - - - - - - No - patient declined information    Current Medications (verified) Outpatient Encounter Medications as of 08/27/2020  Medication Sig  . aspirin 81 MG tablet Take 81 mg by mouth daily.    Marland Kitchen atorvastatin (LIPITOR) 20 MG tablet TAKE 1 TABLET BY MOUTH EVERY DAY  . Calcium Carbonate (CALTRATE 600 PO) Take 1 tablet by mouth 2 (two) times daily.    . Coenzyme Q10 (CO Q-10 PO) Take 50 mg by mouth daily.    . meloxicam (MOBIC) 15 MG tablet TAKE 1 TABLET BY MOUTH EVERY DAY (Patient taking differently: Take 3.75 mg by mouth daily. )  . metoprolol tartrate (LOPRESSOR) 25 MG tablet TAKE 1 TABLET BY MOUTH TWICE A DAY  . MULTIPLE VITAMIN PO Take 1 tablet by mouth  daily.  Marland Kitchen senna-docusate (SENOKOT-S) 8.6-50 MG per tablet Take 1 tablet by mouth daily.    Marland Kitchen VITAMIN D, CHOLECALCIFEROL, PO Take by mouth.  . zolpidem (AMBIEN) 10 MG tablet TAKE 1 TABLET BY MOUTH EVERYDAY AT BEDTIME (Patient taking differently: Take 5 mg by mouth at bedtime. )  . levothyroxine (SYNTHROID) 75 MCG tablet TAKE 1 TABLET BY MOUTH EVERY DAY BEFORE BREAKFAST  . traMADol (ULTRAM) 50 MG tablet Take 1 tablet (50 mg total) by mouth every 8 (eight) hours as needed. (Patient not taking: Reported on 08/27/2020)   No facility-administered encounter medications on file as of 08/27/2020.    Allergies (verified) Clarithromycin, Clindamycin, Codeine, Levofloxacin, Morphine, Naproxen, Penicillins, and Pneumococcal vaccines   History: Past Medical History:  Diagnosis Date  . ALLERGIC RHINITIS   . Allergy    SEASONAL  . Anxiety   . Aortic stenosis, moderate   . Blood transfusion without reported diagnosis   . BREAST CANCER 1991   left, 1991; B mastectomy  . Cataract    BILATERAL-REMOVED  . Coronary artery disease    s/p CABGx5 2007 //Nuclear stress test 10/18: EF 76, no ischemia or infarction, low risk  . Depression   . DIVERTICULOSIS   . DVT   . EROSIVE GASTRITIS   . GERD   . Heart murmur   . History of DVT (deep vein thrombosis)   . HYPERLIPIDEMIA   .  Hypertension   . HYPOTHYROIDISM   . Occlusion and stenosis of carotid artery without mention of cerebral infarction   . Osteopenia   . OSTEOPOROSIS   . RHINOSINUSITIS, RECURRENT    Past Surgical History:  Procedure Laterality Date  . APPENDECTOMY    . COLONOSCOPY    . CORONARY ARTERY BYPASS GRAFT     5        2007  . MASTECTOMY     left with reconstruction and lymph node excision  1992  . MASTECTOMY     right with reconstruction  . REVISION RECONSTRUCTED BREAST Left 02/2014   willard (plastics in [place])  . right ankle arthroscopy    . TEAR DUCT CANAL    . TONSILLECTOMY AND ADENOIDECTOMY    . UPPER GASTROINTESTINAL  ENDOSCOPY    . VAGINAL HYSTERECTOMY     ovaries not excised   Family History  Problem Relation Age of Onset  . Colon cancer Sister   . Colon cancer Sister   . Breast cancer Mother   . Lung disease Mother   . Emphysema Father   . Esophageal cancer Neg Hx   . Rectal cancer Neg Hx   . Stomach cancer Neg Hx   . Thyroid disease Neg Hx    Social History   Socioeconomic History  . Marital status: Divorced    Spouse name: Not on file  . Number of children: 2  . Years of education: Not on file  . Highest education level: Not on file  Occupational History  . Occupation: retired  Tobacco Use  . Smoking status: Never Smoker  . Smokeless tobacco: Never Used  Vaping Use  . Vaping Use: Never used  Substance and Sexual Activity  . Alcohol use: No    Alcohol/week: 0.0 standard drinks  . Drug use: No  . Sexual activity: Not Currently  Other Topics Concern  . 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 Difficulty of Paying Living Expenses: Not hard at all  Food Insecurity: No Food Insecurity  . Worried About Charity fundraiser in the Last Year: Never true  . Ran Out of Food in the Last Year: Never true  Transportation Needs: No Transportation Needs  . Lack of Transportation (Medical): No  . Lack of Transportation (Non-Medical): No  Physical Activity: Sufficiently Active  . Days of Exercise per Week: 7 days  . Minutes of Exercise per Session: 60 min  Stress: No Stress Concern Present  . Feeling of Stress : Not at all  Social Connections: Moderately Integrated  . Frequency of Communication with Friends and Family: More than three times a week  . Frequency of Social Gatherings with Friends and Family: More than three times a week  . Attends Religious Services: More than 4 times per year  . Active Member of Clubs  or Organizations: Yes  . Attends Archivist Meetings: More than 4 times per year  . Marital Status: Divorced    Tobacco Counseling Counseling given: Not Answered   Clinical Intake:  Pre-visit preparation completed: Yes  Pain : No/denies pain Pain Score: 0-No pain     BMI - recorded: 23.74 Nutritional Status: BMI of 19-24  Normal Nutritional Risks: None Diabetes: No  How often do you need to have someone help you  when you read instructions, pamphlets, or other written materials from your doctor or pharmacy?: 1 - Never What is the last grade level you completed in school?: 1 year of college  Diabetic? no  Interpreter Needed?: No  Information entered by :: Lisette Abu, LPN   Activities of Daily Living In your present state of health, do you have any difficulty performing the following activities: 08/27/2020  Hearing? Y  Comment wears hearing aids  Vision? N  Difficulty concentrating or making decisions? N  Walking or climbing stairs? N  Dressing or bathing? N  Doing errands, shopping? N  Preparing Food and eating ? N  Using the Toilet? N  In the past six months, have you accidently leaked urine? Y  Comment wears Depends for protection  Do you have problems with loss of bowel control? Y  Comment wears Depends for protection  Managing your Medications? N  Managing your Finances? N  Housekeeping or managing your Housekeeping? N  Some recent data might be hidden    Patient Care Team: [name], MD as PCP - General (Internal Medicine) [name], MD as PCP - Cardiology (Cardiology) [name], MD as Consulting Physician (Cardiology) Deneise Lever, MD as Consulting Physician (Pulmonary Disease) Irene Shipper, MD as Consulting Physician (Gastroenterology) Danella Sensing, MD as Consulting Physician (Dermatology) Loletta Specter, MD (Plastic Surgery) [name], MD (Obstetrics and Gynecology)  Indicate any recent Medical  Services you may have received from other than [facility] providers in the past year (date may be approximate).     Assessment:   This is a routine wellness examination for [place].  Hearing/Vision screen No exam data present  Dietary issues and exercise activities discussed: Current Exercise Habits: Home exercise routine, Type of exercise: walking, Time (Minutes): 60 (walks 2 miles everyday), Frequency (Times/Week): 7, Weekly Exercise (Minutes/Week): 420, Exercise limited by: None identified  Goals    . <enter goal here>    . maintian current health status     Be as active and as independent as possible. Enjoy life, family, and worship God.    . Patient Stated     Continue to be socially active and enjoy my friends, Psychologist, occupational, stay active in the church. Take time for me to do relaxing things like read, garden and do my devotions.     . Patient Stated     To maintain my current health status by continuing to eat healthy, stay physically active and socially active.      Depression Screen PHQ 2/9 Scores 08/27/2020 07/30/2019 07/19/2018 07/13/2017 09/01/2016 05/29/2015 05/30/2014  PHQ - 2 Score 0 0 1 0 0 0 0  PHQ- 9 Score - - 4 0 - - -    Fall Risk Fall Risk  08/27/2020 01/23/2020 07/30/2019 07/19/2018 07/13/2017  Falls in the past year? 0 1 0 No No  Number falls in past yr: 0 1 0 - -  Injury with Fall? 0 0 0 - -  Risk for fall due to : No Fall Risks - Impaired balance/gait Impaired balance/gait;Impaired mobility Impaired balance/gait;Impaired mobility  Risk for fall due to: Comment - - - - -  Follow up Falls evaluation completed;Education provided - - - -    Any stairs in or around the home? No  If so, are there any without handrails? No  Home free of loose throw rugs in walkways, pet beds, electrical cords, etc? Yes  Adequate lighting in your home to reduce risk of falls? Yes  ASSISTIVE DEVICES UTILIZED TO PREVENT FALLS:  Life alert? No  Use of a cane, walker or w/c? No  Grab bars in  the bathroom? No  Shower chair or bench in shower? No  Elevated toilet seat or a handicapped toilet? No   TIMED UP AND GO:  Was the test performed? No .  Length of time to ambulate 10 feet: 0 sec.   Gait steady and fast without use of assistive device  Cognitive Function: MMSE - Mini Mental State Exam 07/19/2018 07/13/2017  Orientation to time 5 5  Orientation to Place 5 5  Registration 3 3  Attention/ Calculation 5 5  Recall 1 2  Language- name 2 objects 2 2  Language- repeat 1 1  Language- follow 3 step command 3 3  Language- read & follow direction 1 1  Write a sentence 1 1  Copy design 1 1  Total score 28 29     6CIT Screen 08/27/2020  What Year? 0 points  What month? 0 points  What time? 0 points  Count back from 20 0 points  Months in reverse 0 points  Repeat phrase 0 points  Total Score 0    Immunizations Immunization History  Administered Date(s) Administered  . Fluad Quad(high Dose 65+) 07/25/2019, 07/30/2020  . H1N1 11/13/2008  . Influenza Split 08/10/2011, 08/28/2012  . Influenza Whole 08/05/2008, 08/18/2009, 08/05/2010  . Influenza, High Dose Seasonal PF 07/13/2017, 07/19/2018  . Influenza,inj,Quad PF,6+ Mos 08/28/2013, 12/11/2014, 07/30/2015, 07/21/2016  . PFIZER SARS-COV-2 Vaccination 12/05/2019, 12/26/2019, 08/19/2020  . Pneumococcal Conjugate-13 09/05/2013, 05/29/2015  . Pneumococcal-Unspecified 09/05/2013  . Tetanus 05/30/2014  . Zoster 10/06/2013  . Zoster Recombinat (Shingrix) 03/08/2018, 05/08/2018    TDAP status: Due, Education has been provided regarding the importance of this vaccine. Advised may receive this vaccine at local pharmacy or Health Dept. Aware to provide a copy of the vaccination record if obtained from local pharmacy or Health Dept. Verbalized acceptance and understanding. Flu Vaccine status: Up to date Pneumococcal vaccine status: Up to date Covid-19 vaccine status: Completed vaccines  Qualifies for Shingles Vaccine? Yes     Zostavax completed Yes   Shingrix Completed?: Yes  Screening Tests Health Maintenance  Topic Date Due  . PNA vac Low Risk Adult (2 of 2 - PPSV23) 05/28/2016  . DEXA SCAN  04/24/2021  . TETANUS/TDAP  05/30/2024  . INFLUENZA VACCINE  Completed  . COVID-19 Vaccine  Completed    Health Maintenance  Health Maintenance Due  Topic Date Due  . PNA vac Low Risk Adult (2 of 2 - PPSV23) 05/28/2016    Colorectal cancer screening: Completed 04/12/2017. Repeat every 5 years Mammogram status: Completed 01/23/2019. Repeat every year Bone Density status: Completed 04/25/2019. Results reflect: Bone density results: OSTEOPOROSIS. Repeat every 2 years.  Lung Cancer Screening: (Low Dose CT Chest recommended if Age 54-80 years, 30 pack-year currently smoking OR have quit w/in 15years.) does not qualify.   Lung Cancer Screening Referral: no  Additional Screening:  Hepatitis C Screening: does not qualify; Completed no  Vision Screening: Recommended annual ophthalmology exams for early detection of glaucoma and other disorders of the eye. Is the patient up to date with their annual eye exam?  Yes  Who is the provider or what is the name of the office in which the patient attends annual eye exams? Jola Schmidt, MD  If pt is not established with a provider, would they like to be referred to a provider to establish care? No .  Dental Screening: Recommended annual dental exams for proper oral hygiene  Community Resource Referral / Chronic Care Management: CRR required this visit?  No   CCM required this visit?  No      Plan:     I have personally reviewed and noted the following in the patient's chart:   . Medical and social history . Use of alcohol, tobacco or illicit drugs  . Current medications and supplements . Functional ability and status . Nutritional status . Physical activity . Advanced directives . List of other physicians . Hospitalizations, surgeries, and ER visits in  previous 12 months . Vitals . Screenings to include cognitive, depression, and falls . Referrals and appointments  In addition, I have reviewed and discussed with patient certain preventive protocols, quality metrics, and best practice recommendations. A written personalized care plan for preventive services as well as general preventive health recommendations were provided to patient.     Sheral Flow, LPN   60/16/5800   Nurse Notes: n/a

## 2020-09-08 ENCOUNTER — Ambulatory Visit (HOSPITAL_COMMUNITY): Payer: Medicare Other | Attending: Cardiovascular Disease

## 2020-09-08 ENCOUNTER — Other Ambulatory Visit: Payer: Self-pay

## 2020-09-08 ENCOUNTER — Encounter: Payer: Self-pay | Admitting: Cardiovascular Disease

## 2020-09-08 ENCOUNTER — Ambulatory Visit (INDEPENDENT_AMBULATORY_CARE_PROVIDER_SITE_OTHER): Payer: Medicare Other | Admitting: Cardiovascular Disease

## 2020-09-08 VITALS — BP 146/70 | HR 56 | Ht 63.0 in | Wt 134.0 lb

## 2020-09-08 DIAGNOSIS — E782 Mixed hyperlipidemia: Secondary | ICD-10-CM

## 2020-09-08 DIAGNOSIS — I350 Nonrheumatic aortic (valve) stenosis: Secondary | ICD-10-CM | POA: Diagnosis not present

## 2020-09-08 DIAGNOSIS — I10 Essential (primary) hypertension: Secondary | ICD-10-CM

## 2020-09-08 DIAGNOSIS — I2510 Atherosclerotic heart disease of native coronary artery without angina pectoris: Secondary | ICD-10-CM | POA: Diagnosis not present

## 2020-09-08 LAB — ECHOCARDIOGRAM COMPLETE
AR max vel: 0.86 cm2
AV Area VTI: 0.87 cm2
AV Area mean vel: 1.02 cm2
AV Mean grad: 20 mmHg
AV Peak grad: 33.2 mmHg
Ao pk vel: 2.88 m/s
Area-P 1/2: 1.93 cm2
S' Lateral: 1.8 cm

## 2020-09-08 NOTE — Patient Instructions (Signed)

## 2020-09-08 NOTE — Progress Notes (Signed)
Cardiology Office Note:    Date:  09/08/2020   ID:  [name], DOB [birth year], MRN [id number]  PCP:  [name], MD  [place] Cardiologist:  [name], MD  [place] Electrophysiologist:  None   Referring MD: [name], MD   Chief Complaint  Patient presents with  . Coronary Artery Disease    History of Present Illness:    [name] is a 79 y.o. female with a hx of coronary artery disease and aortic stenosis presenting for follow-up evaluation. The patient underwent CABG in 2007. She had repeat heart catheterization in 2011 when she developed recurrent symptoms of chest discomfort and this demonstrated continued patency of all of her bypass grafts. She has developed mild to moderate aortic stenosis and has been followed with serial echo and exams, most recently today.   She is here alone today. She walks 2 miles daily. She states that sometimes she has to slow her pace because she's afraid she would be short of breath if she kept going. No chest pain, pressure, orthopnea, PND, or palpitations. No lightheadedness or syncope. Complains of minimal leg swelling on the left leg.   Past Medical History:  Diagnosis Date  . ALLERGIC RHINITIS   . Allergy    SEASONAL  . Anxiety   . Aortic stenosis, moderate   . Blood transfusion without reported diagnosis   . BREAST CANCER 1991   left, 1991; B mastectomy  . Cataract    BILATERAL-REMOVED  . Coronary artery disease    s/p CABGx5 2007 //Nuclear stress test 10/18: EF 76, no ischemia or infarction, low risk  . Depression   . DIVERTICULOSIS   . DVT   . EROSIVE GASTRITIS   . GERD   . Heart murmur   . History of DVT (deep vein thrombosis)   . HYPERLIPIDEMIA   . Hypertension   . HYPOTHYROIDISM   . Occlusion and stenosis of carotid artery without mention of cerebral infarction   . Osteopenia   . OSTEOPOROSIS   . RHINOSINUSITIS, RECURRENT     Past Surgical History:  Procedure Laterality Date  .  APPENDECTOMY    . COLONOSCOPY    . CORONARY ARTERY BYPASS GRAFT     5        2007  . MASTECTOMY     left with reconstruction and lymph node excision  1992  . MASTECTOMY     right with reconstruction  . REVISION RECONSTRUCTED BREAST Left 02/2014   willard (plastics in [place])  . right ankle arthroscopy    . TEAR DUCT CANAL    . TONSILLECTOMY AND ADENOIDECTOMY    . UPPER GASTROINTESTINAL ENDOSCOPY    . VAGINAL HYSTERECTOMY     ovaries not excised    Current Medications: Current Meds  Medication Sig  . aspirin 81 MG tablet Take 81 mg by mouth daily.    Marland Kitchen atorvastatin (LIPITOR) 20 MG tablet TAKE 1 TABLET BY MOUTH EVERY DAY  . Calcium Carbonate (CALTRATE 600 PO) Take 1 tablet by mouth 2 (two) times daily.    . Coenzyme Q10 (CO Q-10 PO) Take 50 mg by mouth daily.    Marland Kitchen levothyroxine (SYNTHROID) 75 MCG tablet Take 75 mcg by mouth daily before breakfast.  . meloxicam (MOBIC) 15 MG tablet TAKE 1 TABLET BY MOUTH EVERY DAY (Patient taking differently: Take 3.75 mg by mouth daily. )  . metoprolol tartrate (LOPRESSOR) 25 MG tablet TAKE 1 TABLET BY MOUTH TWICE A DAY  .  MULTIPLE VITAMIN PO Take 1 tablet by mouth daily.  Marland Kitchen senna-docusate (SENOKOT-S) 8.6-50 MG per tablet Take 1 tablet by mouth daily.    Marland Kitchen VITAMIN D, CHOLECALCIFEROL, PO Take by mouth.  . zolpidem (AMBIEN) 10 MG tablet TAKE 1 TABLET BY MOUTH EVERYDAY AT BEDTIME (Patient taking differently: Take 5 mg by mouth at bedtime. )     Allergies:   Clarithromycin, Clindamycin, Codeine, Levofloxacin, Morphine, Naproxen, Penicillins, and Pneumococcal vaccines   Social History   Socioeconomic History  . Marital status: Divorced    Spouse name: Not on file  . Number of children: 2  . Years of education: Not on file  . Highest education level: Not on file  Occupational History  . Occupation: retired  Tobacco Use  . Smoking status: Never Smoker  . Smokeless tobacco: Never Used  Vaping Use  . Vaping Use: Never used  Substance and Sexual  Activity  . Alcohol use: No    Alcohol/week: 0.0 standard drinks  . Drug use: No  . Sexual activity: Not Currently  Other Topics Concern  . 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 Difficulty of Paying Living Expenses: Not hard at all  Food Insecurity: No Food Insecurity  . Worried About Charity fundraiser in the Last Year: Never true  . Ran Out of Food in the Last Year: Never true  Transportation Needs: No Transportation Needs  . Lack of Transportation (Medical): No  . Lack of Transportation (Non-Medical): No  Physical Activity: Sufficiently Active  . Days of Exercise per Week: 7 days  . Minutes of Exercise per Session: 60 min  Stress: No Stress Concern Present  . Feeling of Stress : Not at all  Social Connections: Moderately Integrated  . Frequency of Communication with Friends and Family: More than three times a week  . Frequency of Social Gatherings with Friends and Family: More than three times a week  . Attends Religious Services: More than 4 times per year  . Active Member of Clubs or Organizations: Yes  . Attends Archivist Meetings: More than 4 times per year  . Marital Status: Divorced     Family History: The patient's family history includes Breast cancer in her mother; Colon cancer in her sister and sister; Emphysema in her father; Lung disease in her mother. There is no history of Esophageal cancer, Rectal cancer, Stomach cancer, or Thyroid disease.  ROS:   Please see the history of present illness.    All other systems reviewed and are negative.  EKGs/Labs/Other Studies Reviewed:    The following studies were reviewed today: Echo images personally reviewed (see A/P below)  EKG:  EKG is ordered today.  The ekg ordered today demonstrates sinus bradycardia 53 bpm, otherwise  normal  Recent Labs: 01/23/2020: Hemoglobin 13.2; Platelets 225.0 07/30/2020: ALT 17; BUN 19; Creat 0.68; Potassium 4.3; Sodium 138; TSH 2.29  Recent Lipid Panel    Component Value Date/Time   CHOL 144 07/30/2020 1631   CHOL 134 07/28/2018 1153   TRIG 114 07/30/2020 1631   HDL 53 07/30/2020 1631   HDL 52 07/28/2018 1153   CHOLHDL 2.7 07/30/2020 1631   VLDL 14.4 01/23/2020 1502   LDLCALC 71 07/30/2020 1631     Risk Assessment/Calculations:  Physical Exam:    VS:  BP (!) 146/70   Pulse (!) 56   Ht 5\' 3"  (1.6 m)   Wt 134 lb (60.8 kg)   SpO2 99%   BMI 23.74 kg/m     Wt Readings from Last 3 Encounters:  09/08/20 134 lb (60.8 kg)  08/27/20 134 lb (60.8 kg)  07/30/20 135 lb 12.8 oz (61.6 kg)     GEN:  Well nourished, well developed in no acute distress HEENT: Normal NECK: No JVD; BL carotid bruits LYMPHATICS: No lymphadenopathy CARDIAC: RRR, 2/6 harsh early-mid peaking systolic murmur at the RUSB RESPIRATORY:  Clear to auscultation without rales, wheezing or rhonchi  ABDOMEN: Soft, non-tender, non-distended MUSCULOSKELETAL:  No edema; No deformity  SKIN: Warm and dry NEUROLOGIC:  Alert and oriented x 3 PSYCHIATRIC:  Normal affect   ASSESSMENT:    1. Coronary artery disease involving native coronary artery of native heart without angina pectoris   2. Aortic stenosis, moderate   3. Mixed hyperlipidemia   4. Essential hypertension    PLAN:    In order of problems listed above:  1. The patient remained stable without symptoms of angina.  Her history as outlined above with overall well CABG in 2018 followed by cardiac catheterization in 2011 demonstrating patency of all of her grafts.  She will continue on aspirin, a statin drug, and a beta-blocker. 2. The patient has moderate aortic stenosis.  I reviewed her echo today which shows stability for aortic stenosis with peak and mean gradients of 31 and 20 mmHg, respectively.  This is essentially unchanged from her  2019 study when her peak and mean gradients were 29 and 43mmHg, respectively.  We discussed the natural history of aortic stenosis again today.  I will see her back in clinical follow-up in 6 months. 3. Treated with a statin drug.  LDL cholesterol 71 mg/dL on recent labs.  ALT 17. 4. Blood pressure well controlled on current treatment.  Most recent creatinine is 0.68 and potassium 4.3.    Medication Adjustments/Labs and Tests Ordered: Current medicines are reviewed at length with the patient today.  Concerns regarding medicines are outlined above.  Orders Placed This Encounter  Procedures  . EKG 12-Lead   No orders of the defined types were placed in this encounter.   Patient Instructions  Medication Instructions:  Your provider recommends that you continue on your current medications as directed. Please refer to the Current Medication list given to you today.   *If you need a refill on your cardiac medications before your next appointment, please call your pharmacy*  Follow-Up: At [facility], you and your health needs are our priority.  As part of our continuing mission to provide you with exceptional heart care, we have created designated Provider Care Teams.  These Care Teams include your primary Cardiologist (physician) and Advanced Practice Providers (APPs -  Physician Assistants and Nurse Practitioners) who all work together to provide you with the care you need, when you need it. Your next appointment:   6 month(s) The format for your next appointment:   In Person Provider:   You may see [name], MD or one of the following Advanced Practice Providers on your designated Care Team:    Richardson Dopp, PA-C  Robbie Lis, [place]      Signed, [name], MD  09/08/2020 5:19 PM    [place]

## 2020-09-10 ENCOUNTER — Other Ambulatory Visit: Payer: Self-pay | Admitting: Internal Medicine

## 2020-09-12 ENCOUNTER — Other Ambulatory Visit: Payer: Self-pay | Admitting: Internal Medicine

## 2020-09-15 DIAGNOSIS — M278 Other specified diseases of jaws: Secondary | ICD-10-CM | POA: Diagnosis not present

## 2020-09-15 DIAGNOSIS — Z98818 Other dental procedure status: Secondary | ICD-10-CM | POA: Diagnosis not present

## 2020-09-29 ENCOUNTER — Other Ambulatory Visit: Payer: Self-pay | Admitting: Internal Medicine

## 2020-10-06 ENCOUNTER — Other Ambulatory Visit: Payer: Self-pay | Admitting: Internal Medicine

## 2020-10-07 ENCOUNTER — Telehealth: Payer: Self-pay | Admitting: Internal Medicine

## 2020-10-07 MED ORDER — ZOLPIDEM TARTRATE 10 MG PO TABS
5.0000 mg | ORAL_TABLET | Freq: Every day | ORAL | 0 refills | Status: DC
Start: 2020-10-07 — End: 2021-01-15

## 2020-10-07 NOTE — Telephone Encounter (Signed)
Patient is requesting a med refill for zolpidem (AMBIEN) 10 MG tablet  It can be sent to [id number] - [place], [place] - [address]. AT [address]

## 2020-10-27 ENCOUNTER — Ambulatory Visit (INDEPENDENT_AMBULATORY_CARE_PROVIDER_SITE_OTHER): Payer: Medicare Other | Admitting: Podiatry

## 2020-10-27 ENCOUNTER — Ambulatory Visit (INDEPENDENT_AMBULATORY_CARE_PROVIDER_SITE_OTHER): Payer: Medicare Other

## 2020-10-27 ENCOUNTER — Other Ambulatory Visit: Payer: Self-pay

## 2020-10-27 ENCOUNTER — Encounter: Payer: Self-pay | Admitting: Podiatry

## 2020-10-27 DIAGNOSIS — M21619 Bunion of unspecified foot: Secondary | ICD-10-CM | POA: Diagnosis not present

## 2020-10-27 DIAGNOSIS — M216X9 Other acquired deformities of unspecified foot: Secondary | ICD-10-CM

## 2020-10-27 DIAGNOSIS — L84 Corns and callosities: Secondary | ICD-10-CM | POA: Diagnosis not present

## 2020-10-27 DIAGNOSIS — I2510 Atherosclerotic heart disease of native coronary artery without angina pectoris: Secondary | ICD-10-CM

## 2020-10-27 DIAGNOSIS — M21612 Bunion of left foot: Secondary | ICD-10-CM | POA: Diagnosis not present

## 2020-10-29 NOTE — Progress Notes (Signed)
Subjective:   Patient ID: [name], female   DOB: 79 y.o.   MRN: [id number]   HPI Patient presents stating that she is developing increased bunion deformity left over right with lesion formation and pain.  Patient states that she is not sure if the bunion should be corrected or what might help her with her keratotic tissue formation.  Patient does not smoke likes to be active   Review of Systems  All other systems reviewed and are negative.       Objective:  Physical Exam Vitals and nursing note reviewed.  Constitutional:      Appearance: She is well-developed and well-nourished.  Cardiovascular:     Pulses: Intact distal pulses.  Pulmonary:     Effort: Pulmonary effort is normal.  Musculoskeletal:        General: Normal range of motion.  Skin:    General: Skin is warm.  Neurological:     Mental Status: She is alert.     Neurovascular status intact muscle strength was found to be adequate range of motion adequate.  Patient is found to have structural deformity with deviation of the hallux left over right with keratotic tissue formation.  Patient does have good digital perfusion well oriented x3     Assessment:  Lesion formation secondary to structure of the toe joint creating increased pressure in this area     Plan:  H&P education reviewed x-ray discussed and at this point I went ahead and I discussed debridement technique which was done discussed bunion correction educating her on recovery and what would be necessary and would like to try to hold off on that due to health history and vascular issues.  Patient agrees will be seen back as needed and hopefully this will help her and I also encouraged her to get pedicures  X-rays indicate that there is moderate structural deformity noted with keratotic tissue formation

## 2020-11-05 DIAGNOSIS — D1801 Hemangioma of skin and subcutaneous tissue: Secondary | ICD-10-CM | POA: Diagnosis not present

## 2020-11-05 DIAGNOSIS — Z85828 Personal history of other malignant neoplasm of skin: Secondary | ICD-10-CM | POA: Diagnosis not present

## 2020-11-05 DIAGNOSIS — L821 Other seborrheic keratosis: Secondary | ICD-10-CM | POA: Diagnosis not present

## 2020-11-05 DIAGNOSIS — L570 Actinic keratosis: Secondary | ICD-10-CM | POA: Diagnosis not present

## 2020-11-06 ENCOUNTER — Ambulatory Visit (INDEPENDENT_AMBULATORY_CARE_PROVIDER_SITE_OTHER): Payer: Medicare Other | Admitting: Sports Medicine

## 2020-11-06 ENCOUNTER — Other Ambulatory Visit: Payer: Self-pay

## 2020-11-06 DIAGNOSIS — Q667 Congenital pes cavus, unspecified foot: Secondary | ICD-10-CM

## 2020-11-06 NOTE — Assessment & Plan Note (Signed)
Patient's orthotics from 2015 are breaking down and she needs additional support.  In addition we need to compensate for her increased supination of her rear foot  Patient was fitted for a : standard, cushioned, semi-rigid orthotic. The orthotic was heated and afterward the patient stood on the orthotic blank positioned on the orthotic stand. The patient was positioned in subtalar neutral position and 10 degrees of ankle dorsiflexion in a weight bearing stance. After completion of molding, a stable base was applied to the orthotic blank. The blank was ground to a stable position for weight bearing. Size: 7 red med. EVA Base: blue med. Density EVA Posting: RT lateral heel wedge/ none on left Additional orthotic padding: none  At completion of the orthotics the patient was walking better with less supination on the right She had good comfort We will continue these indefinitely

## 2020-11-06 NOTE — Progress Notes (Signed)
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 We first made her custom orthotics in 2015 and those have really helped her She will not walk or stand for very long time without using her orthotics 9 months ago she came back with more right foot pain and we repadded her orthotics These have helped but she feels that she is getting some increased pain on the outside of the right foot  Review of systems No foot swelling No numbness in the feet  Physical exam Pleasant elderly white female who is well oriented and in no acute distress BP 132/60   Ht 5\' 3"  (1.6 m)   Wt 128 lb (58.1 kg)   BMI 22.67 kg/m   Foot evaluation shows that her calcaneal varus has increased substantially over time She has a very high cavus arch on both the right and left In a standing position she has a neutral cavus foot on the left On the right she has significant calcaneal varus and has rear foot supination on standing On walking gait she has excessive supination and lack of normal pronation on the right On the left she has a somewhat supinated gait but essentially a neutral foot strike  Early MTP valgus shift bilaterally

## 2020-12-04 DIAGNOSIS — K098 Other cysts of oral region, not elsewhere classified: Secondary | ICD-10-CM | POA: Diagnosis not present

## 2021-01-14 ENCOUNTER — Other Ambulatory Visit: Payer: Self-pay | Admitting: Internal Medicine

## 2021-01-27 NOTE — Patient Instructions (Addendum)
°  Blood work was ordered.      Medications changes include :   none    Please followup in 6 months

## 2021-01-27 NOTE — Progress Notes (Signed)
Subjective:    Patient ID: [name], female    DOB: [birth year], 80 y.o.   MRN: [id number]  HPI The patient is here for follow up of their chronic medical problems, including htn, hyperlipidemia, hypothyroidism, OP, insomnia  She walks, but only in the nice weather.    She is taking all of her medications as prescribed.    She feels like she has wax in her ears - hearing is not as good even with the hearing aids.    Medications and allergies reviewed with patient and updated if appropriate.  Patient Active Problem List   Diagnosis Date Noted  . Acute left-sided low back pain without sciatica 07/30/2020  . Cavus deformity of foot 05/01/2020  . Poor balance 05/10/2018  . Left ventricular diastolic dysfunction 89/21/1943  . Essential hypertension 07/27/2017  . Fall 03/02/2017  . Osteoarthritis 02/16/2017  . Urinary incontinence 02/16/2017  . Chronic venous insufficiency 08/20/2016  . Xiphoid pain 03/05/2016  . Insomnia 01/16/2016  . Seasonal and perennial allergic rhinitis 11/24/2015  . Asthma, mild intermittent, well-controlled 11/24/2015  . Carotid artery disease ([place]) 06/29/2011  . Hypothyroidism 11/11/2010  . Hyperlipidemia 11/11/2010  . Senile osteoporosis 11/11/2010  . RHINOSINUSITIS, RECURRENT 10/29/2008  . Personal history of malignant neoplasm of breast 03/27/2008  . DIVERTICULOSIS 03/27/2008  . CAD (coronary artery disease) 08/23/2007  . Aortic stenosis, moderate 08/23/2007  . DVT 08/23/2007    Current Outpatient Medications on File Prior to Visit  Medication Sig Dispense Refill  . aspirin 81 MG tablet Take 81 mg by mouth daily.      Marland Kitchen atorvastatin (LIPITOR) 20 MG tablet TAKE 1 TABLET BY MOUTH EVERY DAY 90 tablet 1  . Calcium Carbonate (CALTRATE 600 PO) Take 1 tablet by mouth 2 (two) times daily.      . Coenzyme Q10 (CO Q-10 PO) Take 50 mg by mouth daily.      Marland Kitchen levothyroxine (SYNTHROID) 75 MCG tablet TAKE 1 TABLET BY MOUTH EVERY DAY BEFORE BREAKFAST 90  tablet 1  . meloxicam (MOBIC) 15 MG tablet TAKE 1 TABLET BY MOUTH EVERY DAY (Patient taking differently: Take 3.75 mg by mouth daily.) 90 tablet 0  . metoprolol tartrate (LOPRESSOR) 25 MG tablet TAKE 1 TABLET BY MOUTH TWICE A DAY 180 tablet 1  . MULTIPLE VITAMIN PO Take 1 tablet by mouth daily.    Marland Kitchen senna-docusate (SENOKOT-S) 8.6-50 MG per tablet Take 1 tablet by mouth daily.      Marland Kitchen VITAMIN D, CHOLECALCIFEROL, PO Take by mouth.    . zolpidem (AMBIEN) 10 MG tablet TAKE 1/2 TABLET AT BEDTIME 15 tablet 1   No current facility-administered medications on file prior to visit.    Past Medical History:  Diagnosis Date  . ALLERGIC RHINITIS   . Allergy    SEASONAL  . Anxiety   . Aortic stenosis, moderate   . Blood transfusion without reported diagnosis   . BREAST CANCER 1991   left, 1991; B mastectomy  . Cataract    BILATERAL-REMOVED  . Coronary artery disease    s/p CABGx5 2007 //Nuclear stress test 10/18: EF 76, no ischemia or infarction, low risk  . Depression   . DIVERTICULOSIS   . DVT   . EROSIVE GASTRITIS   . GERD   . Heart murmur   . History of DVT (deep vein thrombosis)   . HYPERLIPIDEMIA   . Hypertension   . HYPOTHYROIDISM   . Occlusion and stenosis of carotid artery without mention of  cerebral infarction   . Osteopenia   . OSTEOPOROSIS   . RHINOSINUSITIS, RECURRENT     Past Surgical History:  Procedure Laterality Date  . APPENDECTOMY    . COLONOSCOPY    . CORONARY ARTERY BYPASS GRAFT     5        2007  . MASTECTOMY     left with reconstruction and lymph node excision  1992  . MASTECTOMY     right with reconstruction  . REVISION RECONSTRUCTED BREAST Left 02/2014   willard (plastics in [place])  . right ankle arthroscopy    . TEAR DUCT CANAL    . TONSILLECTOMY AND ADENOIDECTOMY    . UPPER GASTROINTESTINAL ENDOSCOPY    . VAGINAL HYSTERECTOMY     ovaries not excised    Social History   Socioeconomic History  . Marital status: Divorced    Spouse name: Not on  file  . Number of children: 2  . Years of education: Not on file  . Highest education level: Not on file  Occupational History  . Occupation: retired  Tobacco Use  . Smoking status: Never Smoker  . Smokeless tobacco: Never Used  Vaping Use  . Vaping Use: Never used  Substance and Sexual Activity  . Alcohol use: No    Alcohol/week: 0.0 standard drinks  . Drug use: No  . Sexual activity: Not Currently  Other Topics Concern  . 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 Difficulty of Paying Living Expenses: Not hard at all  Food Insecurity: No Food Insecurity  . Worried About Charity fundraiser in the Last Year: Never true  . Ran Out of Food in the Last Year: Never true  Transportation Needs: No Transportation Needs  . Lack of Transportation (Medical): No  . Lack of Transportation (Non-Medical): No  Physical Activity: Sufficiently Active  . Days of Exercise per Week: 7 days  . Minutes of Exercise per Session: 60 min  Stress: No Stress Concern Present  . Feeling of Stress : Not at all  Social Connections: Moderately Integrated  . Frequency of Communication with Friends and Family: More than three times a week  . Frequency of Social Gatherings with Friends and Family: More than three times a week  . Attends Religious Services: More than 4 times per year  . Active Member of Clubs or Organizations: Yes  . Attends Archivist Meetings: More than 4 times per year  . Marital Status: Divorced    Family History  Problem Relation Age of Onset  . Colon cancer Sister   . Colon cancer Sister   . Breast cancer Mother   . Lung disease Mother   . Emphysema Father   . Esophageal cancer Neg Hx   . Rectal cancer Neg Hx   . Stomach cancer Neg Hx   . Thyroid disease Neg Hx     Review of Systems   Constitutional: Negative for chills and fever.  Respiratory: Negative for cough, shortness of breath and wheezing.   Cardiovascular: Negative for chest pain, palpitations and leg swelling.  Neurological: Negative for light-headedness and headaches.       Objective:   Vitals:   01/28/21 1539  BP: 122/68  Pulse: 63  Temp: 98.1 F (36.7 C)  SpO2: 98%  BP Readings from Last 3 Encounters:  01/28/21 122/68  11/06/20 132/60  09/08/20 (!) 146/70   Wt Readings from Last 3 Encounters:  01/28/21 133 lb (60.3 kg)  11/06/20 128 lb (58.1 kg)  09/08/20 134 lb (60.8 kg)   Body mass index is 23.56 kg/m.   Physical Exam    Constitutional: Appears well-developed and well-nourished. No distress.  HENT:  Head: Normocephalic and atraumatic.  Ears - b/l ear canals and TMs normal, no excessive cerumen Neck: Neck supple. No tracheal deviation present. No thyromegaly present.  No cervical lymphadenopathy Cardiovascular: Normal rate, regular rhythm and normal heart sounds.   3/6 systolic murmur heard. No carotid bruit .  No edema Pulmonary/Chest: Effort normal and breath sounds normal. No respiratory distress. No has no wheezes. No rales.  Skin: Skin is warm and dry. Not diaphoretic.  Psychiatric: Normal mood and affect. Behavior is normal.      Assessment & Plan:    See Problem List for Assessment and Plan of chronic medical problems.    This visit occurred during the SARS-CoV-2 public health emergency.  Safety protocols were in place, including screening questions prior to the visit, additional usage of staff PPE, and extensive cleaning of exam room while observing appropriate contact time as indicated for disinfecting solutions.

## 2021-01-28 ENCOUNTER — Other Ambulatory Visit: Payer: Self-pay

## 2021-01-28 ENCOUNTER — Ambulatory Visit (INDEPENDENT_AMBULATORY_CARE_PROVIDER_SITE_OTHER): Payer: Medicare Other | Admitting: Internal Medicine

## 2021-01-28 ENCOUNTER — Encounter: Payer: Self-pay | Admitting: Internal Medicine

## 2021-01-28 VITALS — BP 122/68 | HR 63 | Temp 98.1°F | Ht 63.0 in | Wt 133.0 lb

## 2021-01-28 DIAGNOSIS — E7849 Other hyperlipidemia: Secondary | ICD-10-CM | POA: Diagnosis not present

## 2021-01-28 DIAGNOSIS — M810 Age-related osteoporosis without current pathological fracture: Secondary | ICD-10-CM

## 2021-01-28 DIAGNOSIS — G4709 Other insomnia: Secondary | ICD-10-CM

## 2021-01-28 DIAGNOSIS — I10 Essential (primary) hypertension: Secondary | ICD-10-CM | POA: Diagnosis not present

## 2021-01-28 DIAGNOSIS — E039 Hypothyroidism, unspecified: Secondary | ICD-10-CM | POA: Diagnosis not present

## 2021-01-28 NOTE — Assessment & Plan Note (Signed)
Chronic  Clinically euthyroid Currently taking levothyroxine 75 mcg Check tsh  Titrate med dose if needed

## 2021-01-28 NOTE — Assessment & Plan Note (Signed)
Chronic dexa up to date Stressed regular walking Continue calcium and vitamin d daily

## 2021-01-28 NOTE — Assessment & Plan Note (Signed)
Chronic BP well controlled Continue metoprolol 25 mg bid cmp  

## 2021-01-28 NOTE — Assessment & Plan Note (Signed)
Chronic Controlled, stable Continue ambien 1/4 of a pill nightly

## 2021-01-28 NOTE — Assessment & Plan Note (Signed)
Chronic Check lipid panel  Continue atorvastatin 20 mg daily Regular exercise and healthy diet encouraged  

## 2021-01-29 LAB — COMPREHENSIVE METABOLIC PANEL
ALT: 17 U/L (ref 0–35)
AST: 24 U/L (ref 0–37)
Albumin: 4.1 g/dL (ref 3.5–5.2)
Alkaline Phosphatase: 63 U/L (ref 39–117)
BUN: 19 mg/dL (ref 6–23)
CO2: 29 mEq/L (ref 19–32)
Calcium: 9.5 mg/dL (ref 8.4–10.5)
Chloride: 102 mEq/L (ref 96–112)
Creatinine, Ser: 0.74 mg/dL (ref 0.40–1.20)
GFR: 75.55 mL/min (ref >60.00)
Glucose, Bld: 66 mg/dL — ABNORMAL LOW (ref 70–99)
Potassium: 4.1 mEq/L (ref 3.5–5.1)
Sodium: 138 mEq/L (ref 135–145)
Total Bilirubin: 1 mg/dL (ref 0.2–1.2)
Total Protein: 6.8 g/dL (ref 6.0–8.3)

## 2021-01-29 LAB — CBC WITH DIFFERENTIAL/PLATELET
Basophils Absolute: 0 10*3/uL (ref 0.0–0.1)
Basophils Relative: 0.6 % (ref 0.0–3.0)
Eosinophils Absolute: 0.2 10*3/uL (ref 0.0–0.7)
Eosinophils Relative: 3.6 % (ref 0.0–5.0)
HCT: 41.2 % (ref 36.0–46.0)
Hemoglobin: 13.5 g/dL (ref 12.0–15.0)
Lymphocytes Relative: 34 % (ref 12.0–46.0)
Lymphs Abs: 2.3 10*3/uL (ref 0.7–4.0)
MCHC: 32.8 g/dL (ref 30.0–36.0)
MCV: 91.1 fl (ref 78.0–100.0)
Monocytes Absolute: 0.7 10*3/uL (ref 0.1–1.0)
Monocytes Relative: 10.1 % (ref 3.0–12.0)
Neutro Abs: 3.5 10*3/uL (ref 1.4–7.7)
Neutrophils Relative %: 51.7 % (ref 43.0–77.0)
Platelets: 259 10*3/uL (ref 150.0–400.0)
RBC: 4.52 Mil/uL (ref 3.87–5.11)
RDW: 13.9 % (ref 11.5–15.5)
WBC: 6.9 10*3/uL (ref 4.0–10.5)

## 2021-01-29 LAB — LIPID PANEL
Cholesterol: 136 mg/dL (ref 0–200)
HDL: 59.7 mg/dL (ref >39.00)
LDL Cholesterol: 61 mg/dL (ref 0–99)
NonHDL: 76.48
Total CHOL/HDL Ratio: 2
Triglycerides: 77 mg/dL (ref 0.0–149.0)
VLDL: 15.4 mg/dL (ref 0.0–40.0)

## 2021-01-29 LAB — TSH: TSH: 1.82 u[IU]/mL (ref 0.35–4.50)

## 2021-02-02 ENCOUNTER — Other Ambulatory Visit: Payer: Self-pay | Admitting: Internal Medicine

## 2021-02-24 DIAGNOSIS — Z23 Encounter for immunization: Secondary | ICD-10-CM | POA: Diagnosis not present

## 2021-03-06 ENCOUNTER — Other Ambulatory Visit: Payer: Self-pay | Admitting: Internal Medicine

## 2021-03-10 ENCOUNTER — Other Ambulatory Visit: Payer: Self-pay | Admitting: Internal Medicine

## 2021-04-02 DIAGNOSIS — Z85828 Personal history of other malignant neoplasm of skin: Secondary | ICD-10-CM | POA: Diagnosis not present

## 2021-04-02 DIAGNOSIS — L821 Other seborrheic keratosis: Secondary | ICD-10-CM | POA: Diagnosis not present

## 2021-04-02 DIAGNOSIS — L820 Inflamed seborrheic keratosis: Secondary | ICD-10-CM | POA: Diagnosis not present

## 2021-04-17 ENCOUNTER — Telehealth: Payer: Self-pay | Admitting: Cardiovascular Disease

## 2021-04-17 NOTE — Telephone Encounter (Signed)
The patient reports L ankle/foot swelling for 2-3 weeks.  The swelling goes down at night and worsens with activity as the day progresses.  She did not injure the ankle. She denies falls/trauma.  She denies redness, pain, tightness, rash, and heat in the affected ankle/foot. Her sneaker still fits but she does have to loosen it throughout the day. She has been walking two miles daily and is very active. She does not sit for extended periods of time at all and has not travelled.  She does not add salt to her food. She has limited time in the day to elevate her leg. She is not on a diuretic.  Instructed her to take it easy today and rest. Reiterated the importance of elevating the L foot when sitting.   She understands she will be called with Dr. Antionette Char recommendations.

## 2021-04-17 NOTE — Telephone Encounter (Signed)
Pt c/o swelling: STAT is pt has developed SOB within 24 hours  1) How much weight have you gained and in what time span? no  2) If swelling, where is the swelling located? Left ankle and foot  3) Are you currently taking a fluid pill? no  4) Are you currently SOB? No   5) Do you have a log of your daily weights (if so, list)? no  6) Have you gained 3 pounds in a day or 5 pounds in a week? no  7) Have you traveled recently? No  Patient called to say that for the last couple of weeks she has been having a lot of swallowing in her life ankle and foot. The more she is on her feet it swells even more.

## 2021-04-20 ENCOUNTER — Other Ambulatory Visit: Payer: Self-pay | Admitting: Internal Medicine

## 2021-04-21 DIAGNOSIS — H04123 Dry eye syndrome of bilateral lacrimal glands: Secondary | ICD-10-CM | POA: Diagnosis not present

## 2021-04-21 DIAGNOSIS — H5203 Hypermetropia, bilateral: Secondary | ICD-10-CM | POA: Diagnosis not present

## 2021-04-21 MED ORDER — FUROSEMIDE 20 MG PO TABS: 20.0000 mg | ORAL_TABLET | Freq: Every day | ORAL | 3 refills | Status: DC | PRN

## 2021-04-21 NOTE — Telephone Encounter (Signed)
Agree with recommendations. I would be ok with lasix 20 mg daily as needed for swelling. thanks

## 2021-04-21 NOTE — Telephone Encounter (Signed)
Instructed the patient to START LASIX 20 mg one daily as needed for swelling. Scheduled the patient for overdue visit with Dr. Burt Knack 06/08/21. She understands to call prior to that time is swelling does not improve. She was grateful for call and agrees with plan.

## 2021-04-23 ENCOUNTER — Encounter: Payer: Self-pay | Admitting: Obstetrics & Gynecology

## 2021-04-23 ENCOUNTER — Ambulatory Visit (INDEPENDENT_AMBULATORY_CARE_PROVIDER_SITE_OTHER): Payer: Medicare Other | Admitting: Obstetrics & Gynecology

## 2021-04-23 ENCOUNTER — Other Ambulatory Visit: Payer: Self-pay

## 2021-04-23 VITALS — BP 126/68 | Ht 63.0 in | Wt 131.0 lb

## 2021-04-23 DIAGNOSIS — Z780 Asymptomatic menopausal state: Secondary | ICD-10-CM

## 2021-04-23 DIAGNOSIS — Z01419 Encounter for gynecological examination (general) (routine) without abnormal findings: Secondary | ICD-10-CM | POA: Diagnosis not present

## 2021-04-23 DIAGNOSIS — M810 Age-related osteoporosis without current pathological fracture: Secondary | ICD-10-CM

## 2021-04-23 DIAGNOSIS — Z90710 Acquired absence of both cervix and uterus: Secondary | ICD-10-CM

## 2021-04-23 NOTE — Progress Notes (Signed)
[name] [birth year] [id number]   History:    80 y.o. G2P2L2 Married.  Has grand-children   RP:  Established patient presenting for annual gyn exam   HPI:  S/P Total Hysterectomy.  No pelvic pain.  Normal vaginal secretions.  Not sexually active.  H/O Left Breast Ca with positive LNs in 1992. S/P Bilateral Mastectomy with reconstruction.  Small nodule on the left chest wall, excised by Dr Dalbert Batman 04/2017, patho benign, fat necrosis with fibrosis.  Urine and bowel movements normal.  Nocturia.  Body mass index 23.21                              .  Walking every day.  Health labs with family physician.  [place] 03/2017.  BD Osteoporosis 04/2019 at [place].  Past medical history,surgical history, family history and social history were all reviewed and documented in the EPIC chart.  Gynecologic History No LMP recorded. Patient has had a hysterectomy.  Obstetric History OB History  Gravida Para Term Preterm AB Living  2 2       2   SAB IAB Ectopic Multiple Live Births               # Outcome Date GA Lbr Len/2nd Weight Sex Delivery Anes PTL Lv  2 Para           1 Para              ROS: A ROS was performed and pertinent positives and negatives are included in the history.  GENERAL: No fevers or chills. HEENT: No change in vision, no earache, sore throat or sinus congestion. NECK: No pain or stiffness. CARDIOVASCULAR: No chest pain or pressure. No palpitations. PULMONARY: No shortness of breath, cough or wheeze. GASTROINTESTINAL: No abdominal pain, nausea, vomiting or diarrhea, melena or bright red blood per rectum. GENITOURINARY: No urinary frequency, urgency, hesitancy or dysuria. MUSCULOSKELETAL: No joint or muscle pain, no back pain, no recent trauma. DERMATOLOGIC: No rash, no itching, no lesions. ENDOCRINE: No polyuria, polydipsia, no heat or cold intolerance. No recent change in weight. HEMATOLOGICAL: No anemia or easy bruising or bleeding. NEUROLOGIC: No headache, seizures, numbness,  tingling or weakness. PSYCHIATRIC: No depression, no loss of interest in normal activity or change in sleep pattern.     Exam:   BP 126/68 (BP Location: Right Arm, Patient Position: Sitting, Cuff Size: Normal)   Ht 5\' 3"  (1.6 m)   Wt 131 lb (59.4 kg)   BMI 23.21 kg/m   Body mass index is 23.21 kg/m.  General appearance : Well developed well nourished female. No acute distress HEENT: Eyes: no retinal hemorrhage or exudates,  Neck supple, trachea midline, no carotid bruits, no thyroidmegaly Lungs: Clear to auscultation, no rhonchi or wheezes, or rib retractions  Heart: Regular rate and rhythm, no murmurs or gallops Breast:Examined in sitting and supine position were symmetrical in appearance, no palpable masses or tenderness,  no skin retraction, no nipple inversion, no nipple discharge, no skin discoloration, no axillary or supraclavicular lymphadenopathy Abdomen: no palpable masses or tenderness, no rebound or guarding Extremities: no edema or skin discoloration or tenderness  Pelvic: Vulva: Normal             Vagina: No gross lesions or discharge  Cervix/Uterus absent  Adnexa  Without masses or tenderness  Anus: Normal   Assessment/Plan:  80 y.o. female for annual exam   1. Well female exam with  routine gynecological exam Gynecologic exam status post total hysterectomy and menopause.  No indication to do a Pap test at this time.  Breast exam status post bilateral mastectomy and reconstruction for left breast cancer.  Colonoscopy May 2018.  Body mass index 23.21.  Health labs with family physician.  2. S/P total hysterectomy  3. Postmenopause Well on no HRT.  4. Age-related osteoporosis without current pathological fracture  Recommend scheduling a [facility] Density at [facility] now.  Continue Vit D supplements, Ca++ 1.5 g/d total and regular weight bearing physical activities.  [name] MD, 2:38 PM 04/23/2021

## 2021-04-27 ENCOUNTER — Telehealth: Payer: Self-pay | Admitting: Internal Medicine

## 2021-04-27 ENCOUNTER — Other Ambulatory Visit: Payer: Self-pay

## 2021-04-27 MED ORDER — MELOXICAM 7.5 MG PO TABS: 3.7500 mg | ORAL_TABLET | Freq: Every day | ORAL | 0 refills | Status: DC

## 2021-04-27 NOTE — Telephone Encounter (Signed)
1.Medication Requested: meloxicam (MOBIC) 15 MG tablet   2. Pharmacy (Name, [place]): [id number] - [place], [place] [place]. AT [address]  3. On Med List: yes   4. Last Visit with PCP: 01-28-21  5. Next visit date with PCP: 08-04-21   Agent: Please be advised that RX refills may take up to 3 business days. We ask that you follow-up with your pharmacy.

## 2021-06-08 ENCOUNTER — Ambulatory Visit (INDEPENDENT_AMBULATORY_CARE_PROVIDER_SITE_OTHER): Payer: Medicare Other | Admitting: Cardiovascular Disease

## 2021-06-08 ENCOUNTER — Encounter: Payer: Self-pay | Admitting: Cardiovascular Disease

## 2021-06-08 ENCOUNTER — Other Ambulatory Visit: Payer: Self-pay

## 2021-06-08 VITALS — BP 140/80 | HR 57 | Ht 63.0 in | Wt 135.8 lb

## 2021-06-08 DIAGNOSIS — E782 Mixed hyperlipidemia: Secondary | ICD-10-CM

## 2021-06-08 DIAGNOSIS — I350 Nonrheumatic aortic (valve) stenosis: Secondary | ICD-10-CM | POA: Diagnosis not present

## 2021-06-08 DIAGNOSIS — I10 Essential (primary) hypertension: Secondary | ICD-10-CM

## 2021-06-08 DIAGNOSIS — R600 Localized edema: Secondary | ICD-10-CM | POA: Diagnosis not present

## 2021-06-08 DIAGNOSIS — I2510 Atherosclerotic heart disease of native coronary artery without angina pectoris: Secondary | ICD-10-CM | POA: Diagnosis not present

## 2021-06-08 NOTE — Patient Instructions (Addendum)
Medication Instructions:  Your physician recommends that you continue on your current medications as directed. Please refer to the Current Medication list given to you today.  *If you need a refill on your cardiac medications before your next appointment, please call your pharmacy*   Lab Work: None If you have labs (blood work) drawn today and your tests are completely normal, you will receive your results only by: [place] (if you have MyChart) OR A paper copy in the mail If you have any lab test that is abnormal or we need to change your treatment, we will call you to review the results.   Testing/Procedures: Your physician has requested that you have an echocardiogram 1-2 weeks prior to seeing Dr. Burt Knack back in 6 months. Echocardiography is a painless test that uses sound waves to create images of your heart. It provides your doctor with information about the size and shape of your heart and how well your heart's chambers and valves are working. This procedure takes approximately one hour. There are no restrictions for this procedure.  Your physician has requested that you have a lower or upper extremity venous duplex. This test is an ultrasound of the veins in the legs or arms. It looks at venous blood flow that carries blood from the heart to the legs or arms. Allow one hour for a Lower Venous exam. Allow thirty minutes for an Upper Venous exam. There are no restrictions or special instructions.    Follow-Up: At [facility], you and your health needs are our priority.  As part of our continuing mission to provide you with exceptional heart care, we have created designated Provider Care Teams.  These Care Teams include your primary Cardiologist (physician) and Advanced Practice Providers (APPs -  Physician Assistants and Nurse Practitioners) who all work together to provide you with the care you need, when you need it.  We recommend signing up for the patient portal called  "MyChart".  Sign up information is provided on this After Visit Summary.  MyChart is used to connect with patients for Virtual Visits (Telemedicine).  Patients are able to view lab/test results, encounter notes, upcoming appointments, etc.  Non-urgent messages can be sent to your provider as well.   To learn more about what you can do with MyChart, go to [email or web address].    Your next appointment:   6 month(s)  The format for your next appointment:   In Person  Provider:   You may see [name], MD or one of the following Advanced Practice Providers on your designated Care Team:   Richardson Dopp, PA-C Robbie Lis, [place]   Other Instructions

## 2021-06-08 NOTE — Progress Notes (Signed)
Cardiology Office Note:    Date:  06/08/2021   ID:  [name] [birth year], MRN [id number]  PCP:  [name], MD   [facility] Providers Cardiologist:  [name], MD     Referring MD: [name], MD   Chief Complaint  Patient presents with   Leg Swelling     History of Present Illness:    [name] is a 80 y.o. female with a hx of coronary artery disease and aortic stenosis, presenting for follow-up evaluation.  The patient has multivessel CAD and was treated with CABG in 2007.  Cardiac catheterization in 2011 demonstrated patency of all of her bypass grafts.  She has been more recently followed for moderate aortic stenosis with serial echo studies.  The patient called in with leg swelling and was treated with diuretic therapy.  She is here alone today, presenting for follow-up.  The patient has been doing pretty well.  She has had some problems with swelling in her left ankle.  She tried taking furosemide but it really did not help much and she had a lot of problems with urinary frequency and incontinence.  She otherwise reports no significant symptoms.  She still walks for exercise on a regular basis, up to 2 miles.  She denies exertional chest pain, chest pressure, or shortness of breath.  No orthopnea or PND.  The patient is compliant with her medications.  Her ankle swelling is much better in the morning after she wakes up but it worsens over the course of the day when she is up on her feet.  Past Medical History:  Diagnosis Date   ALLERGIC RHINITIS    Allergy    SEASONAL   Anxiety    Aortic stenosis, moderate    Blood transfusion without reported diagnosis    BREAST CANCER 1991   left, 1991; B mastectomy   Cataract    BILATERAL-REMOVED   Coronary artery disease    s/p CABGx5 2007 //Nuclear stress test 10/18: EF 76, no ischemia or infarction, low risk   Depression    DIVERTICULOSIS    DVT    EROSIVE GASTRITIS    GERD    Heart murmur     History of DVT (deep vein thrombosis)    HYPERLIPIDEMIA    Hypertension    HYPOTHYROIDISM    Occlusion and stenosis of carotid artery without mention of cerebral infarction    Osteopenia    OSTEOPOROSIS    RHINOSINUSITIS, RECURRENT     Past Surgical History:  Procedure Laterality Date   APPENDECTOMY     COLONOSCOPY     CORONARY ARTERY BYPASS GRAFT     5        2007   MASTECTOMY     left with reconstruction and lymph node excision  1992   MASTECTOMY     right with reconstruction   REVISION RECONSTRUCTED BREAST Left 02/2014   [facility]ard (plastics in [place])   right ankle arthroscopy     TEAR DUCT CANAL     TONSILLECTOMY AND ADENOIDECTOMY     UPPER GASTROINTESTINAL ENDOSCOPY     VAGINAL HYSTERECTOMY     ovaries not excised    Current Medications: Current Meds  Medication Sig   aspirin 81 MG tablet Take 81 mg by mouth daily.     atorvastatin (LIPITOR) 20 MG tablet TAKE 1 TABLET BY MOUTH EVERY DAY   Calcium Carbonate (CALTRATE 600 PO) Take 1 tablet by mouth 2 (two) times daily.  Coenzyme Q10 (CO Q-10 PO) Take 50 mg by mouth daily.     furosemide (LASIX) 20 MG tablet Take 20 mg by mouth. Per patient taking 1/2 tab of 20 mg tablet   levothyroxine (SYNTHROID) 75 MCG tablet TAKE 1 TABLET BY MOUTH EVERY DAY BEFORE BREAKFAST   meloxicam (MOBIC) 7.5 MG tablet Take 0.5-1 tablets (3.75-7.5 mg total) by mouth daily.   metoprolol tartrate (LOPRESSOR) 25 MG tablet TAKE 1 TABLET BY MOUTH TWICE A DAY   MULTIPLE VITAMIN PO Take 1 tablet by mouth daily.   senna-docusate (SENOKOT-S) 8.6-50 MG per tablet Take 1 tablet by mouth daily.     VITAMIN D, CHOLECALCIFEROL, PO Take by mouth.   zolpidem (AMBIEN) 10 MG tablet TAKE 1/2 TABLET BY MOUTH AT BEDTIME     Allergies:   Clarithromycin, Clindamycin, Codeine, Levofloxacin, Morphine, Naproxen, Penicillins, and Pneumococcal vaccines   Social History   Socioeconomic History   Marital status: Divorced    Spouse name: Not on file   Number of  children: 2   Years of education: Not on file   Highest education level: Not on file  Occupational History   Occupation: retired  Tobacco Use   Smoking status: Never   Smokeless tobacco: Never  Vaping Use   Vaping Use: Never used  Substance and Sexual Activity   Alcohol use: No    Alcohol/week: 0.0 standard drinks   Drug use: No   Sexual activity: Not Currently  Other Topics Concern   Not on file  Social History Narrative   Divorced x 3, lives alone   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, Teaching laboratory technician   Social Determinants of Radio broadcast assistant Strain: Low Risk    Difficulty of Paying Living Expenses: Not hard at all  Food Insecurity: No Food Insecurity   Worried About Charity fundraiser in the Last Year: Never true   Arboriculturist in the Last Year: Never true  Transportation Needs: No Transportation Needs   Lack of Transportation (Medical): No   Lack of Transportation (Non-Medical): No  Physical Activity: Sufficiently Active   Days of Exercise per Week: 7 days   Minutes of Exercise per Session: 60 min  Stress: No Stress Concern Present   Feeling of Stress : Not at all  Social Connections: Moderately Integrated   Frequency of Communication with Friends and Family: More than three times a week   Frequency of Social Gatherings with Friends and Family: More than three times a week   Attends Religious Services: More than 4 times per year   Active Member of [employer] or Organizations: Yes   Attends Music therapist: More than 4 times per year   Marital Status: Divorced     Family History: The patient's family history includes Breast cancer in her mother; Colon cancer in her sister and sister; Emphysema in her father; Lung disease in her mother. There is no history of Esophageal cancer, Rectal cancer, Stomach cancer, or Thyroid disease.  ROS:   Please see the history of present illness.    All other systems reviewed and are  negative.  EKGs/Labs/Other Studies Reviewed:    The following studies were reviewed today: 2D echocardiogram 09/08/2020:  Study Result      ECHOCARDIOGRAM REPORT         Patient Name:   Angelica PARDE Date of Exam: 09/08/2020  Medical Rec #:  [id number]     Height:  63.0 in  Accession #:    [id number]    Weight:       134.0 lb  Date of Birth:  Dec 09, 1940      BSA:          1.631 m  Patient Age:    64 years      BP:           120/75 mmHg  Patient Gender: F             HR:           59 bpm.  Exam Location:  [place]   Procedure: 2D Echo, Cardiac Doppler and Color Doppler   Indications:    I35.0 Aortic stenosis     History:        Patient has prior history of Echocardiogram examinations,  most                  recent 09/11/2019. Diastolic CHF, CAD, Prior CABG, Breast                  cancer. Status post left mastectomy., Moderate AS; Risk                  Factors:Hypertension and Dyslipidemia. Prevo.     Sonographer:    Lenard Galloway BA, RDCS  Referring Phys: [place]      Sonographer Comments: Suboptimal apical window. Image acquisition  challenging due to breast implants.  IMPRESSIONS     1. Left ventricular ejection fraction, by estimation, is 70 to 75%. The  left ventricle has hyperdynamic function. The left ventricle has no  regional wall motion abnormalities. There is moderate concentric left  ventricular hypertrophy. Left ventricular  diastolic parameters are consistent with Grade II diastolic dysfunction  (pseudonormalization). Elevated left atrial pressure.   2. Right ventricular systolic function is normal. The right ventricular  size is normal. There is normal pulmonary artery systolic pressure. The  estimated right ventricular systolic pressure is 99991111 mmHg.   3. The mitral valve is normal in structure. Mild to moderate mitral valve  regurgitation. No evidence of mitral stenosis.   4. The aortic valve is normal in structure. There is  severe calcifcation  of the aortic valve. There is severe thickening of the aortic valve.  Aortic valve regurgitation is mild. Moderate aortic valve stenosis. Aortic  valve mean gradient measures 20.0  mmHg.   5. The inferior vena cava is normal in size with greater than 50%  respiratory variability, suggesting right atrial pressure of 3 mmHg.     EKG:  EKG is ordered today.  The ekg ordered today demonstrates sinus bradycardia 57 bpm, within normal limits.  Recent Labs: 01/28/2021: ALT 17; BUN 19; Creatinine, Ser 0.74; Hemoglobin 13.5; Platelets 259.0; Potassium 4.1; Sodium 138; TSH 1.82  Recent Lipid Panel    Component Value Date/Time   CHOL 136 01/28/2021 1624   CHOL 134 07/28/2018 1153   TRIG 77.0 01/28/2021 1624   HDL 59.70 01/28/2021 1624   HDL 52 07/28/2018 1153   CHOLHDL 2 01/28/2021 1624   VLDL 15.4 01/28/2021 1624   LDLCALC 61 01/28/2021 1624   LDLCALC 71 07/30/2020 1631     Risk Assessment/Calculations:           Physical Exam:    VS:  BP 140/80   Pulse (!) 57   Ht '5\' 3"'$  (1.6 m)   Wt 135 lb 12.8 oz (61.6 kg)   SpO2 97%   BMI  24.06 kg/m     Wt Readings from Last 3 Encounters:  06/08/21 135 lb 12.8 oz (61.6 kg)  04/23/21 131 lb (59.4 kg)  01/28/21 133 lb (60.3 kg)     GEN:  Well nourished, well developed in no acute distress HEENT: Normal NECK: No JVD; No carotid bruits LYMPHATICS: No lymphadenopathy CARDIAC: RRR, 2/6 harsh mid peaking crescendo decrescendo murmur at the right upper sternal border RESPIRATORY:  Clear to auscultation without rales, wheezing or rhonchi  ABDOMEN: Soft, non-tender, non-distended MUSCULOSKELETAL: 1+ left ankle edema; no edema on the right.  No deformity  SKIN: Warm and dry NEUROLOGIC:  Alert and oriented x 3 PSYCHIATRIC:  Normal affect   ASSESSMENT:    1. Coronary artery disease involving native coronary artery of native heart without angina pectoris   2. Aortic stenosis, moderate   3. Mixed hyperlipidemia   4.  Essential hypertension   5. Leg edema, left    PLAN:    In order of problems listed above:  Stable with no angina.  Continue aspirin, atorvastatin, and metoprolol. Physical exam is unchanged.  Repeat echocardiogram in 6 months when she returns for follow-up.  Discussed symptoms of aortic stenosis today.  Reviewed most recent echocardiogram.  Mean gradient has been stable in the moderate range. Lipids are excellent with an LDL of 61 mg/dL and an HDL of 60 mg/dL.  LFTs have been normal.  Continue atorvastatin. Blood pressure is controlled on current medical therapy which includes metoprolol. Suspect venous insufficiency, but will rule out DVT with lower extremity venous duplex of the left leg   Medication Adjustments/Labs and Tests Ordered: Current medicines are reviewed at length with the patient today.  Concerns regarding medicines are outlined above.  Orders Placed This Encounter  Procedures   EKG 12-Lead   ECHOCARDIOGRAM COMPLETE    No orders of the defined types were placed in this encounter.   Patient Instructions  Medication Instructions:  Your physician recommends that you continue on your current medications as directed. Please refer to the Current Medication list given to you today.  *If you need a refill on your cardiac medications before your next appointment, please call your pharmacy*   Lab Work: None If you have labs (blood work) drawn today and your tests are completely normal, you will receive your results only by: [place] (if you have MyChart) OR A paper copy in the mail If you have any lab test that is abnormal or we need to change your treatment, we will call you to review the results.   Testing/Procedures: Your physician has requested that you have an echocardiogram 1-2 weeks prior to seeing Dr. Burt Knack back in 6 months. Echocardiography is a painless test that uses sound waves to create images of your heart. It provides your doctor with information  about the size and shape of your heart and how well your heart's chambers and valves are working. This procedure takes approximately one hour. There are no restrictions for this procedure.    Follow-Up: At [facility], you and your health needs are our priority.  As part of our continuing mission to provide you with exceptional heart care, we have created designated Provider Care Teams.  These Care Teams include your primary Cardiologist (physician) and Advanced Practice Providers (APPs -  Physician Assistants and Nurse Practitioners) who all work together to provide you with the care you need, when you need it.  We recommend signing up for the patient portal called "MyChart".  Sign up information  is provided on this After Visit Summary.  MyChart is used to connect with patients for Virtual Visits (Telemedicine).  Patients are able to view lab/test results, encounter notes, upcoming appointments, etc.  Non-urgent messages can be sent to your provider as well.   To learn more about what you can do with MyChart, go to [email or web address].    Your next appointment:   6 month(s)  The format for your next appointment:   In Person  Provider:   You may see [name], MD or one of the following Advanced Practice Providers on your designated Care Team:   Richardson Dopp, PA-C Robbie Lis, [place]   Other Instructions     Signed, [name], MD  06/08/2021 2:35 PM    [place]

## 2021-06-12 ENCOUNTER — Ambulatory Visit (HOSPITAL_COMMUNITY)
Admission: RE | Admit: 2021-06-12 | Discharge: 2021-06-12 | Disposition: A | Discharge status: Home / Self Care | Payer: Medicare Other | Source: Ambulatory Visit | Attending: Cardiovascular Disease | Admitting: Cardiovascular Disease

## 2021-06-12 ENCOUNTER — Other Ambulatory Visit: Payer: Self-pay

## 2021-06-12 ENCOUNTER — Telehealth: Payer: Self-pay | Admitting: Cardiovascular Disease

## 2021-06-12 DIAGNOSIS — R600 Localized edema: Secondary | ICD-10-CM | POA: Insufficient documentation

## 2021-06-12 NOTE — Telephone Encounter (Signed)
Vascular tech called to report pts left venous ultrasound showed no evidence of Baker's cyst or DVT. She will forward result to Dr. Burt Knack for review. Pt advised and told her that Dr. Burt Knack still has to review the report.

## 2021-06-20 ENCOUNTER — Other Ambulatory Visit: Payer: Self-pay | Admitting: Internal Medicine

## 2021-08-03 NOTE — Patient Instructions (Addendum)
  Blood work was ordered.     Flu immunization administered today.     Medications changes include :   none  Your prescription(s) have been submitted to your pharmacy. Please take as directed and contact our office if you believe you are having problem(s) with the medication(s).   Please followup in 6 months

## 2021-08-03 NOTE — Progress Notes (Signed)
Subjective:    Patient ID: [name], female    DOB: [birth year], 80 y.o.   MRN: [id number]  This visit occurred during the SARS-CoV-2 public health emergency.  Safety protocols were in place, including screening questions prior to the visit, additional usage of staff PPE, and extensive cleaning of exam room while observing appropriate contact time as indicated for disinfecting solutions.     HPI The patient is here for follow up of their chronic medical problems, including htn, hichol, hypothyroid, OP, insomnia  2-3 weeks used a q-tip and the end had no cotton on it when she took it out and she is not sure if the tip had cotton on it  or not.  She denied any pain then and no pain now.  No change in hearing.   She is exercising regularly.     Medications and allergies reviewed with patient and updated if appropriate.  Patient Active Problem List   Diagnosis Date Noted   Mitral regurgitation, mild-mod 08/04/2021   Acute left-sided low back pain without sciatica 07/30/2020   Cavus deformity of foot 05/01/2020   Poor balance 05/10/2018   Left ventricular diastolic dysfunction [id number]   Essential hypertension 07/27/2017   Fall 03/02/2017   Osteoarthritis 02/16/2017   Urinary incontinence 02/16/2017   Chronic venous insufficiency 08/20/2016   Xiphoid pain 03/05/2016   Insomnia 01/16/2016   Seasonal and perennial allergic rhinitis 11/24/2015   Asthma, mild intermittent, well-controlled 11/24/2015   Carotid artery disease ([place]) 06/29/2011   Hypothyroidism 11/11/2010   Hyperlipidemia 11/11/2010   Senile osteoporosis 11/11/2010   RHINOSINUSITIS, RECURRENT 10/29/2008   Personal history of malignant neoplasm of breast 03/27/2008   DIVERTICULOSIS 03/27/2008   CAD (coronary artery disease) 08/23/2007   Aortic stenosis, moderate 08/23/2007   DVT 08/23/2007    Current Outpatient Medications on File Prior to Visit  Medication Sig Dispense Refill   aspirin 81 MG tablet Take  81 mg by mouth daily.       atorvastatin (LIPITOR) 20 MG tablet TAKE 1 TABLET BY MOUTH EVERY DAY 90 tablet 1   Calcium Carbonate (CALTRATE 600 PO) Take 1 tablet by mouth 2 (two) times daily.       Coenzyme Q10 (CO Q-10 PO) Take 50 mg by mouth daily.       furosemide (LASIX) 20 MG tablet Take 20 mg by mouth. Per patient taking 1/2 tab of 20 mg tablet     levothyroxine (SYNTHROID) 75 MCG tablet TAKE 1 TABLET BY MOUTH EVERY DAY BEFORE BREAKFAST 90 tablet 1   meloxicam (MOBIC) 7.5 MG tablet TAKE 1/2-1 TABLETS (3.75-7.5 MG TOTAL) BY MOUTH DAILY. 90 tablet 0   metoprolol tartrate (LOPRESSOR) 25 MG tablet TAKE 1 TABLET BY MOUTH TWICE A DAY 180 tablet 1   MULTIPLE VITAMIN PO Take 1 tablet by mouth daily.     senna-docusate (SENOKOT-S) 8.6-50 MG per tablet Take 1 tablet by mouth daily.       VITAMIN D, CHOLECALCIFEROL, PO Take by mouth.     zolpidem (AMBIEN) 10 MG tablet TAKE 1/2 TABLET BY MOUTH AT BEDTIME 15 tablet 1   No current facility-administered medications on file prior to visit.    Past Medical History:  Diagnosis Date   ALLERGIC RHINITIS    Allergy    SEASONAL   Anxiety    Aortic stenosis, moderate    Blood transfusion without reported diagnosis    BREAST CANCER 1991   left, 1991; B mastectomy   Cataract  BILATERAL-REMOVED   Coronary artery disease    s/p CABGx5 2007 //Nuclear stress test 10/18: EF 76, no ischemia or infarction, low risk   Depression    DIVERTICULOSIS    DVT    EROSIVE GASTRITIS    GERD    Heart murmur    History of DVT (deep vein thrombosis)    HYPERLIPIDEMIA    Hypertension    HYPOTHYROIDISM    Occlusion and stenosis of carotid artery without mention of cerebral infarction    Osteopenia    OSTEOPOROSIS    RHINOSINUSITIS, RECURRENT     Past Surgical History:  Procedure Laterality Date   APPENDECTOMY     COLONOSCOPY     CORONARY ARTERY BYPASS GRAFT     5        2007   MASTECTOMY     left with reconstruction and lymph node excision  1992    MASTECTOMY     right with reconstruction   REVISION RECONSTRUCTED BREAST Left 02/2014   [facility]ard (plastics in [place])   right ankle arthroscopy     TEAR DUCT CANAL     TONSILLECTOMY AND ADENOIDECTOMY     UPPER GASTROINTESTINAL ENDOSCOPY     VAGINAL HYSTERECTOMY     ovaries not excised    Social History   Socioeconomic History   Marital status: Divorced    Spouse name: Not on file   Number of children: 2   Years of education: Not on file   Highest education level: Not on file  Occupational History   Occupation: retired  Tobacco Use   Smoking status: Never   Smokeless tobacco: Never  Vaping Use   Vaping Use: Never used  Substance and Sexual Activity   Alcohol use: No    Alcohol/week: 0.0 standard drinks   Drug use: No   Sexual activity: Not Currently  Other Topics Concern   Not on file  Social History Narrative   Divorced x 3, lives alone   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, Teaching laboratory technician   Social Determinants of Radio broadcast assistant Strain: Low Risk    Difficulty of Paying Living Expenses: Not hard at all  Food Insecurity: No Food Insecurity   Worried About Charity fundraiser in the Last Year: Never true   Arboriculturist in the Last Year: Never true  Transportation Needs: No Transportation Needs   Lack of Transportation (Medical): No   Lack of Transportation (Non-Medical): No  Physical Activity: Sufficiently Active   Days of Exercise per Week: 7 days   Minutes of Exercise per Session: 60 min  Stress: No Stress Concern Present   Feeling of Stress : Not at all  Social Connections: Moderately Integrated   Frequency of Communication with Friends and Family: More than three times a week   Frequency of Social Gatherings with Friends and Family: More than three times a week   Attends Religious Services: More than 4 times per year   Active Member of [employer] or Organizations: Yes   Attends Music therapist: More than 4 times per year    Marital Status: Divorced    Family History  Problem Relation Age of Onset   Colon cancer Sister    Colon cancer Sister    Breast cancer Mother    Lung disease Mother    Emphysema Father    Esophageal cancer Neg Hx    Rectal cancer Neg Hx    Stomach cancer Neg Hx  Thyroid disease Neg Hx     Review of Systems  Constitutional:  Negative for fever.  HENT:  Negative for ear pain.   Respiratory:  Positive for shortness of breath (if she eats a light breakfast and then walks). Negative for cough and wheezing.   Cardiovascular:  Positive for leg swelling. Negative for chest pain and palpitations.  Neurological:  Negative for light-headedness and headaches.      Objective:   Vitals:   08/04/21 1345  BP: 120/74  Pulse: 60  Temp: 98.2 F (36.8 C)  SpO2: 96%   BP Readings from Last 3 Encounters:  08/04/21 120/74  06/08/21 140/80  04/23/21 126/68   Wt Readings from Last 3 Encounters:  08/04/21 133 lb (60.3 kg)  06/08/21 135 lb 12.8 oz (61.6 kg)  04/23/21 131 lb (59.4 kg)   Body mass index is 23.56 kg/m.   Physical Exam    Constitutional: Appears well-developed and well-nourished. No distress.  HENT:  Head: Normocephalic and atraumatic.  Ears: b/l ear canals with scant cerumen and w/o foreign body, normal TM's b/l Neck: Neck supple. No tracheal deviation present. No thyromegaly present.  No cervical lymphadenopathy Cardiovascular: Normal rate, regular rhythm and normal heart sounds.   3/6 sys  murmur heard. No carotid bruit .  No edema Pulmonary/Chest: Effort normal and breath sounds normal. No respiratory distress. No has no wheezes. No rales.  Skin: Skin is warm and dry. Not diaphoretic.  Psychiatric: Normal mood and affect. Behavior is normal.      Assessment & Plan:    See Problem List for Assessment and Plan of chronic medical problems.

## 2021-08-04 ENCOUNTER — Encounter: Payer: Self-pay | Admitting: Internal Medicine

## 2021-08-04 ENCOUNTER — Other Ambulatory Visit: Payer: Self-pay

## 2021-08-04 ENCOUNTER — Ambulatory Visit (INDEPENDENT_AMBULATORY_CARE_PROVIDER_SITE_OTHER): Payer: Medicare Other | Admitting: Internal Medicine

## 2021-08-04 VITALS — BP 120/74 | HR 60 | Temp 98.2°F | Ht 63.0 in | Wt 133.0 lb

## 2021-08-04 DIAGNOSIS — Z23 Encounter for immunization: Secondary | ICD-10-CM | POA: Diagnosis not present

## 2021-08-04 DIAGNOSIS — I10 Essential (primary) hypertension: Secondary | ICD-10-CM | POA: Diagnosis not present

## 2021-08-04 DIAGNOSIS — E039 Hypothyroidism, unspecified: Secondary | ICD-10-CM

## 2021-08-04 DIAGNOSIS — M150 Primary generalized (osteo)arthritis: Secondary | ICD-10-CM

## 2021-08-04 DIAGNOSIS — E7849 Other hyperlipidemia: Secondary | ICD-10-CM

## 2021-08-04 DIAGNOSIS — G4709 Other insomnia: Secondary | ICD-10-CM | POA: Diagnosis not present

## 2021-08-04 DIAGNOSIS — M159 Polyosteoarthritis, unspecified: Secondary | ICD-10-CM

## 2021-08-04 DIAGNOSIS — M810 Age-related osteoporosis without current pathological fracture: Secondary | ICD-10-CM | POA: Diagnosis not present

## 2021-08-04 DIAGNOSIS — I2510 Atherosclerotic heart disease of native coronary artery without angina pectoris: Secondary | ICD-10-CM

## 2021-08-04 DIAGNOSIS — I340 Nonrheumatic mitral (valve) insufficiency: Secondary | ICD-10-CM | POA: Insufficient documentation

## 2021-08-04 DIAGNOSIS — M8949 Other hypertrophic osteoarthropathy, multiple sites: Secondary | ICD-10-CM | POA: Diagnosis not present

## 2021-08-04 LAB — CBC WITH DIFFERENTIAL/PLATELET
Basophils Absolute: 0 10*3/uL (ref 0.0–0.1)
Basophils Relative: 0.6 % (ref 0.0–3.0)
Eosinophils Absolute: 0.3 10*3/uL (ref 0.0–0.7)
Eosinophils Relative: 4.2 % (ref 0.0–5.0)
HCT: 40.1 % (ref 36.0–46.0)
Hemoglobin: 13.1 g/dL (ref 12.0–15.0)
Lymphocytes Relative: 31.2 % (ref 12.0–46.0)
Lymphs Abs: 1.9 10*3/uL (ref 0.7–4.0)
MCHC: 32.5 g/dL (ref 30.0–36.0)
MCV: 91 fl (ref 78.0–100.0)
Monocytes Absolute: 0.5 10*3/uL (ref 0.1–1.0)
Monocytes Relative: 8.7 % (ref 3.0–12.0)
Neutro Abs: 3.3 10*3/uL (ref 1.4–7.7)
Neutrophils Relative %: 55.3 % (ref 43.0–77.0)
Platelets: 249 10*3/uL (ref 150.0–400.0)
RBC: 4.41 Mil/uL (ref 3.87–5.11)
RDW: 14.4 % (ref 11.5–15.5)
WBC: 6 10*3/uL (ref 4.0–10.5)

## 2021-08-04 LAB — COMPREHENSIVE METABOLIC PANEL
ALT: 19 U/L (ref 0–35)
AST: 26 U/L (ref 0–37)
Albumin: 4 g/dL (ref 3.5–5.2)
Alkaline Phosphatase: 65 U/L (ref 39–117)
BUN: 13 mg/dL (ref 6–23)
CO2: 28 mEq/L (ref 19–32)
Calcium: 9.2 mg/dL (ref 8.4–10.5)
Chloride: 104 mEq/L (ref 96–112)
Creatinine, Ser: 0.77 mg/dL (ref 0.40–1.20)
GFR: 71.78 mL/min (ref >60.00)
Glucose, Bld: 77 mg/dL (ref 70–99)
Potassium: 4.1 mEq/L (ref 3.5–5.1)
Sodium: 139 mEq/L (ref 135–145)
Total Bilirubin: 1.2 mg/dL (ref 0.2–1.2)
Total Protein: 6.7 g/dL (ref 6.0–8.3)

## 2021-08-04 LAB — LIPID PANEL
Cholesterol: 126 mg/dL (ref 0–200)
HDL: 60.1 mg/dL (ref >39.00)
LDL Cholesterol: 52 mg/dL (ref 0–99)
NonHDL: 65.67
Total CHOL/HDL Ratio: 2
Triglycerides: 69 mg/dL (ref 0.0–149.0)
VLDL: 13.8 mg/dL (ref 0.0–40.0)

## 2021-08-04 LAB — TSH: TSH: 0.81 u[IU]/mL (ref 0.35–5.50)

## 2021-08-04 MED ORDER — ZOLPIDEM TARTRATE 10 MG PO TABS: 5.0000 mg | ORAL_TABLET | Freq: Every day | ORAL | 5 refills | Status: DC

## 2021-08-04 NOTE — Assessment & Plan Note (Signed)
Chronic Controlled, stable Continue ambien 5 mg nightly

## 2021-08-04 NOTE — Assessment & Plan Note (Signed)
Chronic  Clinically euthyroid Currently taking levothyroxine 75 mcg qd Check tsh  Titrate med dose if needed

## 2021-08-04 NOTE — Assessment & Plan Note (Signed)
Chronic Multiple joints Taking meloxicam 7.5 mg  - take 1/2 tab daily  controlled

## 2021-08-04 NOTE — Addendum Note (Signed)
Addended by: Boris Lown B on: 08/04/2021 02:45 PM   Modules accepted: Orders

## 2021-08-04 NOTE — Assessment & Plan Note (Signed)
Chronic BP well controlled Continue metoprolol 25 mg bid cmp  

## 2021-08-04 NOTE — Addendum Note (Signed)
Addended by: Marcina Millard on: 08/04/2021 02:43 PM   Modules accepted: Orders

## 2021-08-04 NOTE — Assessment & Plan Note (Signed)
Chronic Check lipid panel  Continue atorvastatin 20 mg daily Regular exercise and healthy diet encouraged  

## 2021-08-04 NOTE — Assessment & Plan Note (Signed)
Chronic dexa due - ordered Continue regular exercise Continue calcium and vitamin d daily

## 2021-08-07 ENCOUNTER — Other Ambulatory Visit: Payer: Self-pay | Admitting: Internal Medicine

## 2021-08-31 ENCOUNTER — Ambulatory Visit (INDEPENDENT_AMBULATORY_CARE_PROVIDER_SITE_OTHER): Payer: Medicare Other

## 2021-08-31 ENCOUNTER — Other Ambulatory Visit: Payer: Self-pay

## 2021-08-31 VITALS — BP 120/74 | HR 63 | Temp 98.1°F | Resp 16 | Ht 63.0 in | Wt 131.2 lb

## 2021-08-31 DIAGNOSIS — M810 Age-related osteoporosis without current pathological fracture: Secondary | ICD-10-CM

## 2021-08-31 DIAGNOSIS — Z0000 Encounter for general adult medical examination without abnormal findings: Secondary | ICD-10-CM

## 2021-08-31 NOTE — Progress Notes (Signed)
Subjective:   [name] is a 80 y.o. female who presents for Medicare Annual (Subsequent) preventive examination.  Review of Systems     Cardiac Risk Factors include: advanced age (>70men, >57 women);dyslipidemia     Objective:    Today's Vitals   08/31/21 1713  BP: 120/74  Pulse: 63  Resp: 16  Temp: 98.1 F (36.7 C)  SpO2: 98%  Weight: 131 lb 3.2 oz (59.5 kg)  Height: 5\' 3"  (1.6 m)  PainSc: 0-No pain   Body mass index is 23.24 kg/m.  Advanced Directives 08/31/2021 08/27/2020 07/30/2019 07/19/2018 07/13/2017 03/30/2017 08/20/2016  Does Patient Have a Medical Advance Directive? Yes Yes Yes Yes Yes Yes Yes  Type of Advance Directive Living will;Healthcare Power of [place];Living will [place];Living will [place];Living will [place];Living will [place];Living will [place];Living will  Does patient want to make changes to medical advance directive? No - Patient declined No - Patient declined - - - - -  Copy of [place] in Chart? No - copy requested No - copy requested No - copy requested No - copy requested No - copy requested - -  Would patient like information on creating a medical advance directive? - - - - - - -    Current Medications (verified) Outpatient Encounter Medications as of 08/31/2021  Medication Sig   aspirin 81 MG tablet Take 81 mg by mouth daily.     atorvastatin (LIPITOR) 20 MG tablet TAKE 1 TABLET BY MOUTH EVERY DAY   Calcium Carbonate (CALTRATE 600 PO) Take 1 tablet by mouth 2 (two) times daily.     Coenzyme Q10 (CO Q-10 PO) Take 50 mg by mouth daily.     furosemide (LASIX) 20 MG tablet Take 20 mg by mouth. Per patient taking 1/2 tab of 20 mg tablet   levothyroxine (SYNTHROID) 75 MCG tablet TAKE 1 TABLET BY MOUTH EVERY DAY BEFORE BREAKFAST   meloxicam (MOBIC) 7.5 MG tablet TAKE 1/2-1 TABLETS (3.75-7.5 MG  TOTAL) BY MOUTH DAILY.   metoprolol tartrate (LOPRESSOR) 25 MG tablet TAKE 1 TABLET BY MOUTH TWICE A DAY   MULTIPLE VITAMIN PO Take 1 tablet by mouth daily.   senna-docusate (SENOKOT-S) 8.6-50 MG per tablet Take 1 tablet by mouth daily.     VITAMIN D, CHOLECALCIFEROL, PO Take by mouth.   zolpidem (AMBIEN) 10 MG tablet Take 0.5 tablets (5 mg total) by mouth at bedtime.   No facility-administered encounter medications on file as of 08/31/2021.    Allergies (verified) Clarithromycin, Clindamycin, Codeine, Levofloxacin, Morphine, Naproxen, Penicillins, and Pneumococcal vaccines   History: Past Medical History:  Diagnosis Date   ALLERGIC RHINITIS    Allergy    SEASONAL   Anxiety    Aortic stenosis, moderate    Blood transfusion without reported diagnosis    BREAST CANCER 1991   left, 1991; B mastectomy   Cataract    BILATERAL-REMOVED   Coronary artery disease    s/p CABGx5 2007 //Nuclear stress test 10/18: EF 76, no ischemia or infarction, low risk   Depression    DIVERTICULOSIS    DVT    EROSIVE GASTRITIS    GERD    Heart murmur    History of DVT (deep vein thrombosis)    HYPERLIPIDEMIA    Hypertension    HYPOTHYROIDISM    Occlusion and stenosis of carotid artery without mention of cerebral infarction  Osteopenia    OSTEOPOROSIS    RHINOSINUSITIS, RECURRENT    Past Surgical History:  Procedure Laterality Date   APPENDECTOMY     COLONOSCOPY     CORONARY ARTERY BYPASS GRAFT     5        2007   MASTECTOMY     left with reconstruction and lymph node excision  1992   MASTECTOMY     right with reconstruction   REVISION RECONSTRUCTED BREAST Left 02/2014   willard (plastics in [place])   right ankle arthroscopy     TEAR DUCT CANAL     TONSILLECTOMY AND ADENOIDECTOMY     UPPER GASTROINTESTINAL ENDOSCOPY     VAGINAL HYSTERECTOMY     ovaries not excised   Family History  Problem Relation Age of Onset   Colon cancer Sister    Colon cancer Sister    Breast cancer Mother     Lung disease Mother    Emphysema Father    Esophageal cancer Neg Hx    Rectal cancer Neg Hx    Stomach cancer Neg Hx    Thyroid disease Neg Hx    Social History   Socioeconomic History   Marital status: Divorced    Spouse name: Not on file   Number of children: 2   Years of education: Not on file   Highest education level: Not on file  Occupational History   Occupation: retired  Tobacco Use   Smoking status: Never   Smokeless tobacco: Never  Vaping Use   Vaping Use: Never used  Substance and Sexual Activity   Alcohol use: No    Alcohol/week: 0.0 standard drinks   Drug use: No   Sexual activity: Not Currently  Other Topics Concern   Not on file  Social History Narrative   Divorced x 3, lives alone   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, Teaching laboratory technician   Social Determinants of Radio broadcast assistant Strain: Low Risk    Difficulty of Paying Living Expenses: Not hard at all  Food Insecurity: No Food Insecurity   Worried About Charity fundraiser in the Last Year: Never true   Arboriculturist in the Last Year: Never true  Transportation Needs: No Transportation Needs   Lack of Transportation (Medical): No   Lack of Transportation (Non-Medical): No  Physical Activity: Sufficiently Active   Days of Exercise per Week: 7 days   Minutes of Exercise per Session: 30 min  Stress: No Stress Concern Present   Feeling of Stress : Not at all  Social Connections: Moderately Integrated   Frequency of Communication with Friends and Family: More than three times a week   Frequency of Social Gatherings with Friends and Family: More than three times a week   Attends Religious Services: More than 4 times per year   Active Member of [employer] or Organizations: Yes   Attends Music therapist: More than 4 times per year   Marital Status: Divorced    Tobacco Counseling Counseling given: Not Answered   Clinical Intake:  Pre-visit preparation  completed: Yes  Pain : No/denies pain Pain Score: 0-No pain     BMI - recorded: 23.24 Nutritional Status: BMI of 19-24  Normal Nutritional Risks: None Diabetes: No  How often do you need to have someone help you when you read instructions, pamphlets, or other written materials from your doctor or pharmacy?: 1 - Never What is the last grade level you  completed in school?: 1 year of college  Diabetic? no  Interpreter Needed?: No  Information entered by :: Lisette Abu, LPN   Activities of Daily Living In your present state of health, do you have any difficulty performing the following activities: 08/31/2021  Hearing? N  Vision? N  Difficulty concentrating or making decisions? N  Walking or climbing stairs? N  Dressing or bathing? N  Doing errands, shopping? N  Preparing Food and eating ? N  Using the Toilet? N  In the past six months, have you accidently leaked urine? Y  Do you have problems with loss of bowel control? N  Managing your Medications? N  Managing your Finances? N  Housekeeping or managing your Housekeeping? N  Some recent data might be hidden    Patient Care Team: [name], MD as PCP - General (Internal Medicine) [name], MD as PCP - Cardiology (Cardiology) [name], MD as Consulting Physician (Cardiology) Deneise Lever, MD as Consulting Physician (Pulmonary Disease) Irene Shipper, MD as Consulting Physician (Gastroenterology) Danella Sensing, MD as Consulting Physician (Dermatology) Loletta Specter, MD (Plastic Surgery) [name], MD (Obstetrics and Gynecology) Jola Schmidt, MD as Consulting Physician (Ophthalmology)  Indicate any recent Medical Services you may have received from other than [facility] providers in the past year (date may be approximate).     Assessment:   This is a routine wellness examination for [name].  Hearing/Vision screen Hearing Screening - Comments:: Patient wears hearing aids. Vision  Screening - Comments:: Patient wears corrective glasses/contacts.  Eye exam done annually by: Jola Schmidt, MD.  Dietary issues and exercise activities discussed: Current Exercise Habits: Home exercise routine, Type of exercise: walking, Time (Minutes): 30, Frequency (Times/Week): 7, Weekly Exercise (Minutes/Week): 210, Intensity: Moderate, Exercise limited by: respiratory conditions(s);cardiac condition(s);orthopedic condition(s)   Goals Addressed   None   Depression Screen PHQ 2/9 Scores 08/31/2021 08/27/2020 07/30/2019 07/19/2018 07/13/2017 09/01/2016 05/29/2015  PHQ - 2 Score 0 0 0 1 0 0 0  PHQ- 9 Score - - - 4 0 - -    Fall Risk Fall Risk  08/31/2021 08/27/2020 01/23/2020 07/30/2019 07/19/2018  Falls in the past year? 0 0 1 0 No  Number falls in past yr: 0 0 1 0 -  Injury with Fall? 0 0 0 0 -  Risk for fall due to : No Fall Risks No Fall Risks - Impaired balance/gait Impaired balance/gait;Impaired mobility  Risk for fall due to: Comment - - - - -  Follow up Falls evaluation completed Falls evaluation completed;Education provided - - -    FALL RISK PREVENTION PERTAINING TO THE HOME:  Any stairs in or around the home? No  If so, are there any without handrails? No  Home free of loose throw rugs in walkways, pet beds, electrical cords, etc? Yes  Adequate lighting in your home to reduce risk of falls? Yes   ASSISTIVE DEVICES UTILIZED TO PREVENT FALLS:  Life alert? No  Use of a cane, walker or w/c? No  Grab bars in the bathroom? No  Shower chair or bench in shower? No  Elevated toilet seat or a handicapped toilet? No   TIMED UP AND GO:  Was the test performed? Yes .  Length of time to ambulate 10 feet: 7 sec.   Gait steady and fast without use of assistive device  Cognitive Function: Normal cognitive status assessed by direct observation by this Nurse Health Advisor. No abnormalities found.   MMSE - Mini Mental State  Exam 07/19/2018 07/13/2017  Orientation to time 5 5   Orientation to Place 5 5  Registration 3 3  Attention/ Calculation 5 5  Recall 1 2  Language- name 2 objects 2 2  Language- repeat 1 1  Language- follow 3 step command 3 3  Language- read & follow direction 1 1  Write a sentence 1 1  Copy design 1 1  Total score 28 29     6CIT Screen 08/27/2020  What Year? 0 points  What month? 0 points  What time? 0 points  Count back from 20 0 points  Months in reverse 0 points  Repeat phrase 0 points  Total Score 0    Immunizations Immunization History  Administered Date(s) Administered   Fluad Quad(high Dose 65+) 07/25/2019, 07/30/2020, 08/04/2021   H1N1 11/13/2008   Influenza Split 08/10/2011, 08/28/2012   Influenza Whole 08/05/2008, 08/18/2009, 08/05/2010   Influenza, High Dose Seasonal PF 07/13/2017, 07/19/2018   Influenza,inj,Quad PF,6+ Mos 08/28/2013, 12/11/2014, 07/30/2015, 07/21/2016   PFIZER(Purple Top)SARS-COV-2 Vaccination 12/05/2019, 12/26/2019, 08/19/2020   Pfizer Covid-19 Vaccine Bivalent Booster 93yrs & up 08/11/2021   Pneumococcal Conjugate-13 09/05/2013, 05/29/2015   Pneumococcal-Unspecified 09/05/2013   Tetanus 05/30/2014   Zoster Recombinat (Shingrix) 03/08/2018, 05/08/2018   Zoster, Live 10/06/2013    TDAP status: Due, Education has been provided regarding the importance of this vaccine. Advised may receive this vaccine at local pharmacy or Health Dept. Aware to provide a copy of the vaccination record if obtained from local pharmacy or Health Dept. Verbalized acceptance and understanding.  Flu Vaccine status: Up to date  Pneumococcal vaccine status: Up to date  Covid-19 vaccine status: Completed vaccines  Qualifies for Shingles Vaccine? Yes   Zostavax completed Yes   Shingrix Completed?: Yes  Screening Tests Health Maintenance  Topic Date Due   COVID-19 Vaccine (4 - Booster for [place] series) 11/11/2020   DEXA SCAN  08/04/2022 (Originally 04/24/2021)   TETANUS/TDAP  05/30/2024   INFLUENZA VACCINE   Completed   Zoster Vaccines- Shingrix  Completed   HPV VACCINES  Aged Out    Health Maintenance  Health Maintenance Due  Topic Date Due   COVID-19 Vaccine (4 - Booster for Pfizer series) 11/11/2020    Colorectal cancer screening: No longer required.   Mammogram status: No longer required due to not a candidate for screening.  Bone Density status: Ordered 08/31/2021. Pt provided with contact info and advised to call to schedule appt.  Lung Cancer Screening: (Low Dose CT Chest recommended if Age 46-80 years, 30 pack-year currently smoking OR have quit w/in 15years.) does not qualify.   Lung Cancer Screening Referral: no  Additional Screening:  Hepatitis C Screening: does not qualify; Completed no  Vision Screening: Recommended annual ophthalmology exams for early detection of glaucoma and other disorders of the eye. Is the patient up to date with their annual eye exam?  Yes  Who is the provider or what is the name of the office in which the patient attends annual eye exams? Jola Schmidt, MD. If pt is not established with a provider, would they like to be referred to a provider to establish care? No .   Dental Screening: Recommended annual dental exams for proper oral hygiene  Community Resource Referral / Chronic Care Management: CRR required this visit?  No   CCM required this visit?  No      Plan:     I have personally reviewed and noted the following in the patient's chart:   Medical and  social history Use of alcohol, tobacco or illicit drugs  Current medications and supplements including opioid prescriptions.  Functional ability and status Nutritional status Physical activity Advanced directives List of other physicians Hospitalizations, surgeries, and ER visits in previous 12 months Vitals Screenings to include cognitive, depression, and falls Referrals and appointments  In addition, I have reviewed and discussed with patient certain preventive protocols,  quality metrics, and best practice recommendations. A written personalized care plan for preventive services as well as general preventive health recommendations were provided to patient.     Sheral Flow, LPN   [id number]   Nurse Notes:  Hearing Screening - Comments:: Patient wears hearing aids. Vision Screening - Comments:: Patient wears corrective glasses/contacts.  Eye exam done annually by: Jola Schmidt, MD.

## 2021-08-31 NOTE — Patient Instructions (Signed)
[name] , Thank you for taking time to come for your Medicare Wellness Visit. I appreciate your ongoing commitment to your health goals. Please review the following plan we discussed and let me know if I can assist you in the future.   Screening recommendations/referrals: Colonoscopy: Not a candidate for screening due to age 80: Not a candidate for screening due to age Bone Density: 04/25/2019; due every 2 years Recommended yearly ophthalmology/optometry visit for glaucoma screening and checkup Recommended yearly dental visit for hygiene and checkup  Vaccinations: Influenza vaccine: 08/04/2021 Pneumococcal vaccine: 09/05/2013, 05/29/2015 Tdap vaccine: never done Shingles vaccine: 03/08/2018, 05/08/2018   Covid-19: 12/05/2019, 12/26/2019, 08/19/2020, 08/11/2021  Advanced directives: Please bring a copy of your health care power of attorney and living will to the office at your convenience.   Conditions/risks identified: Yes; Client understands the importance of follow-up with providers by attending scheduled visits and discussed goals to eat healthier, increase physical activity, exercise the brain, socialize more, get enough sleep and make time for laughter.  Next appointment: Please schedule your next Medicare Wellness Visit with your Nurse Health Advisor in 1 year by calling [phone].   Preventive Care 21 Years and Older, Female Preventive care refers to lifestyle choices and visits with your health care provider that can promote health and wellness. What does preventive care include? A yearly physical exam. This is also called an annual well check. Dental exams once or twice a year. Routine eye exams. Ask your health care provider how often you should have your eyes checked. Personal lifestyle choices, including: Daily care of your teeth and gums. Regular physical activity. Eating a healthy diet. Avoiding tobacco and drug use. Limiting alcohol use. Practicing safe  sex. Taking low-dose aspirin every day. Taking vitamin and mineral supplements as recommended by your health care provider. What happens during an annual well check? The services and screenings done by your health care provider during your annual well check will depend on your age, overall health, lifestyle risk factors, and family history of disease. Counseling  Your health care provider may ask you questions about your: Alcohol use. Tobacco use. Drug use. Emotional well-being. Home and relationship well-being. Sexual activity. Eating habits. History of falls. Memory and ability to understand (cognition). Work and work Statistician. Reproductive health. Screening  You may have the following tests or measurements: Height, weight, and BMI. Blood pressure. Lipid and cholesterol levels. These may be checked every 5 years, or more frequently if you are over 38 years old. Skin check. Lung cancer screening. You may have this screening every year starting at age 31 if you have a 30-pack-year history of smoking and currently smoke or have quit within the past 15 years. Fecal occult blood test (FOBT) of the stool. You may have this test every year starting at age 40. Flexible sigmoidoscopy or colonoscopy. You may have a sigmoidoscopy every 5 years or a colonoscopy every 10 years starting at age 28. Hepatitis C blood test. Hepatitis B blood test. Sexually transmitted disease (STD) testing. Diabetes screening. This is done by checking your blood sugar (glucose) after you have not eaten for a while (fasting). You may have this done every 1-3 years. Bone density scan. This is done to screen for osteoporosis. You may have this done starting at age 36. Mammogram. This may be done every 1-2 years. Talk to your health care provider about how often you should have regular mammograms. Talk with your health care provider about your test results, treatment options, and if  necessary, the need for more  tests. Vaccines  Your health care provider may recommend certain vaccines, such as: Influenza vaccine. This is recommended every year. Tetanus, diphtheria, and acellular pertussis (Tdap, Td) vaccine. You may need a Td booster every 10 years. Zoster vaccine. You may need this after age 27. Pneumococcal 13-valent conjugate (PCV13) vaccine. One dose is recommended after age 68. Pneumococcal polysaccharide (PPSV23) vaccine. One dose is recommended after age 47. Talk to your health care provider about which screenings and vaccines you need and how often you need them. This information is not intended to replace advice given to you by your health care provider. Make sure you discuss any questions you have with your health care provider. Document Released: 11/28/2015 Document Revised: 07/21/2016 Document Reviewed: 09/02/2015 Elsevier Interactive Patient Education  2017 [place] Prevention in the Home Falls can cause injuries. They can happen to people of all ages. There are many things you can do to make your home safe and to help prevent falls. What can I do on the outside of my home? Regularly fix the edges of walkways and driveways and fix any cracks. Remove anything that might make you trip as you walk through a door, such as a raised step or threshold. Trim any bushes or trees on the path to your home. Use bright outdoor lighting. Clear any walking paths of anything that might make someone trip, such as rocks or tools. Regularly check to see if handrails are loose or broken. Make sure that both sides of any steps have handrails. Any raised decks and porches should have guardrails on the edges. Have any leaves, snow, or ice cleared regularly. Use sand or salt on walking paths during winter. Clean up any spills in your garage right away. This includes oil or grease spills. What can I do in the bathroom? Use night lights. Install grab bars by the toilet and in the tub and shower.  Do not use towel bars as grab bars. Use non-skid mats or decals in the tub or shower. If you need to sit down in the shower, use a plastic, non-slip stool. Keep the floor dry. Clean up any water that spills on the floor as soon as it happens. Remove soap buildup in the tub or shower regularly. Attach bath mats securely with double-sided non-slip rug tape. Do not have throw rugs and other things on the floor that can make you trip. What can I do in the bedroom? Use night lights. Make sure that you have a light by your bed that is easy to reach. Do not use any sheets or blankets that are too big for your bed. They should not hang down onto the floor. Have a firm chair that has side arms. You can use this for support while you get dressed. Do not have throw rugs and other things on the floor that can make you trip. What can I do in the kitchen? Clean up any spills right away. Avoid walking on wet floors. Keep items that you use a lot in easy-to-reach places. If you need to reach something above you, use a strong step stool that has a grab bar. Keep electrical cords out of the way. Do not use floor polish or wax that makes floors slippery. If you must use wax, use non-skid floor wax. Do not have throw rugs and other things on the floor that can make you trip. What can I do with my stairs? Do not leave any items  on the stairs. Make sure that there are handrails on both sides of the stairs and use them. Fix handrails that are broken or loose. Make sure that handrails are as long as the stairways. Check any carpeting to make sure that it is firmly attached to the stairs. Fix any carpet that is loose or worn. Avoid having throw rugs at the top or bottom of the stairs. If you do have throw rugs, attach them to the floor with carpet tape. Make sure that you have a light switch at the top of the stairs and the bottom of the stairs. If you do not have them, ask someone to add them for you. What else  can I do to help prevent falls? Wear shoes that: Do not have high heels. Have rubber bottoms. Are comfortable and fit you well. Are closed at the toe. Do not wear sandals. If you use a stepladder: Make sure that it is fully opened. Do not climb a closed stepladder. Make sure that both sides of the stepladder are locked into place. Ask someone to hold it for you, if possible. Clearly mark and make sure that you can see: Any grab bars or handrails. First and last steps. Where the edge of each step is. Use tools that help you move around (mobility aids) if they are needed. These include: Canes. Walkers. Scooters. Crutches. Turn on the lights when you go into a dark area. Replace any light bulbs as soon as they burn out. Set up your furniture so you have a clear path. Avoid moving your furniture around. If any of your floors are uneven, fix them. If there are any pets around you, be aware of where they are. Review your medicines with your doctor. Some medicines can make you feel dizzy. This can increase your chance of falling. Ask your doctor what other things that you can do to help prevent falls. This information is not intended to replace advice given to you by your health care provider. Make sure you discuss any questions you have with your health care provider. Document Released: 08/28/2009 Document Revised: 04/08/2016 Document Reviewed: 12/06/2014 Elsevier Interactive Patient Education  2017 [employer].

## 2021-09-13 ENCOUNTER — Other Ambulatory Visit: Payer: Self-pay | Admitting: Internal Medicine

## 2021-09-15 DIAGNOSIS — K099 Cyst of oral region, unspecified: Secondary | ICD-10-CM | POA: Diagnosis not present

## 2021-10-01 ENCOUNTER — Telehealth: Payer: Self-pay | Admitting: Internal Medicine

## 2021-10-01 DIAGNOSIS — M810 Age-related osteoporosis without current pathological fracture: Secondary | ICD-10-CM

## 2021-10-01 NOTE — Telephone Encounter (Signed)
[facility] called patient has a called asking for a referal for a bone density scan. Fax number is [phone]

## 2021-10-01 NOTE — Telephone Encounter (Signed)
Referral ordered-I am not sure how long it takes [place] to send it to them.

## 2021-10-02 ENCOUNTER — Encounter: Payer: Self-pay | Admitting: Internal Medicine

## 2021-10-02 DIAGNOSIS — Z780 Asymptomatic menopausal state: Secondary | ICD-10-CM | POA: Diagnosis not present

## 2021-10-02 DIAGNOSIS — M85852 Other specified disorders of bone density and structure, left thigh: Secondary | ICD-10-CM | POA: Diagnosis not present

## 2021-10-02 DIAGNOSIS — M810 Age-related osteoporosis without current pathological fracture: Secondary | ICD-10-CM | POA: Diagnosis not present

## 2021-10-02 DIAGNOSIS — M85851 Other specified disorders of bone density and structure, right thigh: Secondary | ICD-10-CM | POA: Diagnosis not present

## 2021-11-05 DIAGNOSIS — L820 Inflamed seborrheic keratosis: Secondary | ICD-10-CM | POA: Diagnosis not present

## 2021-11-05 DIAGNOSIS — L858 Other specified epidermal thickening: Secondary | ICD-10-CM | POA: Diagnosis not present

## 2021-11-05 DIAGNOSIS — L853 Xerosis cutis: Secondary | ICD-10-CM | POA: Diagnosis not present

## 2021-11-05 DIAGNOSIS — D225 Melanocytic nevi of trunk: Secondary | ICD-10-CM | POA: Diagnosis not present

## 2021-11-05 DIAGNOSIS — L821 Other seborrheic keratosis: Secondary | ICD-10-CM | POA: Diagnosis not present

## 2021-11-05 DIAGNOSIS — Z85828 Personal history of other malignant neoplasm of skin: Secondary | ICD-10-CM | POA: Diagnosis not present

## 2021-11-24 ENCOUNTER — Ambulatory Visit (HOSPITAL_COMMUNITY): Payer: Medicare Other | Attending: Cardiology

## 2021-11-24 ENCOUNTER — Other Ambulatory Visit: Payer: Self-pay

## 2021-11-24 DIAGNOSIS — I350 Nonrheumatic aortic (valve) stenosis: Secondary | ICD-10-CM

## 2021-11-24 LAB — ECHOCARDIOGRAM COMPLETE
AR max vel: 0.86 cm2
AV Area VTI: 0.96 cm2
AV Area mean vel: 0.95 cm2
AV Mean grad: 19 mmHg
AV Peak grad: 30.9 mmHg
Ao pk vel: 2.78 m/s
Area-P 1/2: 2.72 cm2
S' Lateral: 2.2 cm

## 2021-11-30 ENCOUNTER — Other Ambulatory Visit: Payer: Self-pay

## 2021-11-30 ENCOUNTER — Telehealth: Payer: Self-pay | Admitting: Cardiovascular Disease

## 2021-11-30 ENCOUNTER — Telehealth: Payer: Self-pay

## 2021-11-30 MED ORDER — FLUOXETINE HCL 10 MG PO CAPS: 10.0000 mg | ORAL_CAPSULE | Freq: Every day | ORAL | 0 refills | Status: DC

## 2021-11-30 NOTE — Telephone Encounter (Signed)
Pt is calling in requesting a Prescription for FLUoxetine (PROZAC) 10 MG capsule  Be sent in before her appt on 1/20.  It is on the d/c medication list.  Starting 11/19/21 pt states that she is falling apart due to granddaughter is taking a job out of state. Pt also states that she is unable to sleep due to her heating unit being so loud. Pt states that she has lost 5 lbs in 2 weeks.  Pt is showing possible signs of depression. She states that she isn't wanting to harm herself or anyone else.  Pt scheduled for appt 12/04/21.

## 2021-11-30 NOTE — Telephone Encounter (Signed)
Sent in today for patient. 

## 2021-11-30 NOTE — Telephone Encounter (Signed)
The patient has been notified of the result and verbalized understanding.  All questions (if any) were answered. Molli Barrows, RN 11/30/2021 12:20 PM

## 2021-11-30 NOTE — Telephone Encounter (Signed)
-----   Message from [name], MD sent at 11/29/2021  9:28 AM EST ----- Stable findings of diastolic dysfunction and mild/moderate aortic stenosis. Overall good result with no significant change from prior studies. Continue current Rx and planned outpatient follow-up.

## 2021-11-30 NOTE — Telephone Encounter (Signed)
Patient is returning call to discuss echo results. °

## 2021-12-03 NOTE — Progress Notes (Signed)
Subjective:    Patient ID: [name], female    DOB: [birth year], 81 y.o.   MRN: [id number]  This visit occurred during the SARS-CoV-2 public health emergency.  Safety protocols were in place, including screening questions prior to the visit, additional usage of staff PPE, and extensive cleaning of exam room while observing appropriate contact time as indicated for disinfecting solutions.    HPI The patient is here for an acute visit.   She is experiencing some increased anxiety and depression.  She called in a few days ago and asked to restart the fluoxetine and she just restarted that.  Her furnace was replaced, her daughters mother in law died, a family member lost her job.  The furnace was louder.  Her granddaughter is moving to [place].  She thinks her daughter drinks too much wine.  After the incident with her furnace she got very upset - she cried, got very upset and lost it.  She got all worked up.  She has multiple things that have upset her recently.   She can not stop crying.  She is not hungry and not eating.  She has a lot of nausea, burping and gagging - she takes a lot of gas-ex, tums. She is going cold to hot.  She was worried about her heart because at 1 point she did have burping and that was when she needed to have heart surgery.  She sees cardiology on Monday.  She has lost 5lb by her scale.    From the knees down it feels like they are going to blow out.  Denies pain.  No swelling.  She also has skin crawling feeling intermittently.  She is lethargic.     She is not exercising  Medications and allergies reviewed with patient and updated if appropriate.  Patient Active Problem List   Diagnosis Date Noted   Mitral regurgitation, mild-mod 08/04/2021   Acute left-sided low back pain without sciatica 07/30/2020   Cavus deformity of foot 05/01/2020   Poor balance 05/10/2018   Left ventricular diastolic dysfunction 70/11/7492   Essential hypertension 07/27/2017    Fall 03/02/2017   Osteoarthritis 02/16/2017   Urinary incontinence 02/16/2017   Chronic venous insufficiency 08/20/2016   Xiphoid pain 03/05/2016   Insomnia 01/16/2016   Seasonal and perennial allergic rhinitis 11/24/2015   Asthma, mild intermittent, well-controlled 11/24/2015   Carotid artery disease ([place]) 06/29/2011   Hypothyroidism 11/11/2010   Hyperlipidemia 11/11/2010   Senile osteoporosis 11/11/2010   RHINOSINUSITIS, RECURRENT 10/29/2008   Personal history of malignant neoplasm of breast 03/27/2008   GERD (gastroesophageal reflux disease) 03/27/2008   DIVERTICULOSIS 03/27/2008   CAD (coronary artery disease) 08/23/2007   Aortic stenosis, moderate 08/23/2007   DVT 08/23/2007    Current Outpatient Medications on File Prior to Visit  Medication Sig Dispense Refill   aspirin 81 MG tablet Take 81 mg by mouth daily.       atorvastatin (LIPITOR) 20 MG tablet TAKE 1 TABLET BY MOUTH EVERY DAY 90 tablet 1   Calcium Carbonate (CALTRATE 600 PO) Take 1 tablet by mouth 2 (two) times daily.       Coenzyme Q10 (CO Q-10 PO) Take 50 mg by mouth daily.       FLUoxetine (PROZAC) 10 MG capsule Take 1 capsule (10 mg total) by mouth daily. 30 capsule 0   furosemide (LASIX) 20 MG tablet Take 20 mg by mouth. Per patient taking 1/2 tab of 20 mg tablet  levothyroxine (SYNTHROID) 75 MCG tablet TAKE 1 TABLET BY MOUTH EVERY DAY BEFORE BREAKFAST 90 tablet 1   meloxicam (MOBIC) 7.5 MG tablet TAKE 1/2-1 TABLETS (3.75-7.5 MG TOTAL) BY MOUTH DAILY. 90 tablet 0   metoprolol tartrate (LOPRESSOR) 25 MG tablet TAKE 1 TABLET BY MOUTH TWICE A DAY 180 tablet 1   MULTIPLE VITAMIN PO Take 1 tablet by mouth daily.     senna-docusate (SENOKOT-S) 8.6-50 MG per tablet Take 1 tablet by mouth daily.       VITAMIN D, CHOLECALCIFEROL, PO Take by mouth.     zolpidem (AMBIEN) 10 MG tablet Take 0.5 tablets (5 mg total) by mouth at bedtime. 15 tablet 5   No current facility-administered medications on file prior to visit.     Past Medical History:  Diagnosis Date   ALLERGIC RHINITIS    Allergy    SEASONAL   Anxiety    Aortic stenosis, moderate    Blood transfusion without reported diagnosis    BREAST CANCER 1991   left, 1991; B mastectomy   Cataract    BILATERAL-REMOVED   Coronary artery disease    s/p CABGx5 2007 //Nuclear stress test 10/18: EF 76, no ischemia or infarction, low risk   Depression    DIVERTICULOSIS    DVT    EROSIVE GASTRITIS    GERD    Heart murmur    History of DVT (deep vein thrombosis)    HYPERLIPIDEMIA    Hypertension    HYPOTHYROIDISM    Occlusion and stenosis of carotid artery without mention of cerebral infarction    Osteopenia    OSTEOPOROSIS    RHINOSINUSITIS, RECURRENT     Past Surgical History:  Procedure Laterality Date   APPENDECTOMY     COLONOSCOPY     CORONARY ARTERY BYPASS GRAFT     5        2007   MASTECTOMY     left with reconstruction and lymph node excision  1992   MASTECTOMY     right with reconstruction   REVISION RECONSTRUCTED BREAST Left 02/2014   willard (plastics in [place])   right ankle arthroscopy     TEAR DUCT CANAL     TONSILLECTOMY AND ADENOIDECTOMY     UPPER GASTROINTESTINAL ENDOSCOPY     VAGINAL HYSTERECTOMY     ovaries not excised    Social History   Socioeconomic History   Marital status: Divorced    Spouse name: Not on file   Number of children: 2   Years of education: Not on file   Highest education level: Not on file  Occupational History   Occupation: retired  Tobacco Use   Smoking status: Never   Smokeless tobacco: Never  Vaping Use   Vaping Use: Never used  Substance and Sexual Activity   Alcohol use: No    Alcohol/week: 0.0 standard drinks   Drug use: No   Sexual activity: Not Currently  Other Topics Concern   Not on file  Social History Narrative   Divorced x 3, lives alone   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, Teaching laboratory technician   Social Determinants of Radio broadcast assistant  Strain: Low Risk    Difficulty of Paying Living Expenses: Not hard at all  Food Insecurity: No Food Insecurity   Worried About Charity fundraiser in the Last Year: Never true   Arboriculturist in the Last Year: Never true  Transportation Needs: No Transportation Needs  Lack of Transportation (Medical): No   Lack of Transportation (Non-Medical): No  Physical Activity: Sufficiently Active   Days of Exercise per Week: 7 days   Minutes of Exercise per Session: 30 min  Stress: No Stress Concern Present   Feeling of Stress : Not at all  Social Connections: Moderately Integrated   Frequency of Communication with Friends and Family: More than three times a week   Frequency of Social Gatherings with Friends and Family: More than three times a week   Attends Religious Services: More than 4 times per year   Active Member of [employer] or Organizations: Yes   Attends Music therapist: More than 4 times per year   Marital Status: Divorced    Family History  Problem Relation Age of Onset   Colon cancer Sister    Colon cancer Sister    Breast cancer Mother    Lung disease Mother    Emphysema Father    Esophageal cancer Neg Hx    Rectal cancer Neg Hx    Stomach cancer Neg Hx    Thyroid disease Neg Hx     Review of Systems  Constitutional:  Positive for fatigue and fever.  Respiratory:  Negative for cough, shortness of breath and wheezing.   Cardiovascular:  Negative for chest pain, palpitations and leg swelling.  Gastrointestinal:  Positive for nausea. Negative for abdominal pain, blood in stool, constipation and diarrhea.       Burping, gaggping,  no reflux  Genitourinary:  Positive for frequency (nocutuia 3-4 times). Negative for dysuria and hematuria.  Neurological:  Positive for light-headedness. Negative for headaches.      Objective:   Vitals:   12/04/21 1317  BP: 130/78  Pulse: (!) 51  Temp: 98 F (36.7 C)  SpO2: 98%   BP Readings from Last 3 Encounters:   12/04/21 130/78  08/31/21 120/74  08/04/21 120/74   Wt Readings from Last 3 Encounters:  12/04/21 129 lb (58.5 kg)  08/31/21 131 lb 3.2 oz (59.5 kg)  08/04/21 133 lb (60.3 kg)   Body mass index is 22.85 kg/m.   Physical Exam Constitutional:      General: She is not in acute distress.    Appearance: Normal appearance. She is not ill-appearing.  HENT:     Head: Normocephalic and atraumatic.  Eyes:     Conjunctiva/sclera: Conjunctivae normal.  Cardiovascular:     Rate and Rhythm: Normal rate and regular rhythm.     Heart sounds: Murmur (3/6 systolic) heard.  Pulmonary:     Effort: Pulmonary effort is normal. No respiratory distress.     Breath sounds: No wheezing or rales.  Musculoskeletal:     Cervical back: Neck supple. No tenderness.     Right lower leg: No edema.     Left lower leg: No edema.  Lymphadenopathy:     Cervical: No cervical adenopathy.  Skin:    General: Skin is warm and dry.  Neurological:     Mental Status: She is alert.           Assessment & Plan:    See Problem List for Assessment and Plan of chronic medical problems.

## 2021-12-04 ENCOUNTER — Encounter: Payer: Self-pay | Admitting: Internal Medicine

## 2021-12-04 ENCOUNTER — Ambulatory Visit (INDEPENDENT_AMBULATORY_CARE_PROVIDER_SITE_OTHER): Payer: Medicare Other | Admitting: Internal Medicine

## 2021-12-04 ENCOUNTER — Ambulatory Visit: Payer: Medicare Other | Admitting: Internal Medicine

## 2021-12-04 ENCOUNTER — Other Ambulatory Visit: Payer: Self-pay

## 2021-12-04 VITALS — BP 130/78 | HR 51 | Temp 98.0°F | Ht 63.0 in | Wt 129.0 lb

## 2021-12-04 DIAGNOSIS — F419 Anxiety disorder, unspecified: Secondary | ICD-10-CM | POA: Diagnosis not present

## 2021-12-04 DIAGNOSIS — R350 Frequency of micturition: Secondary | ICD-10-CM

## 2021-12-04 DIAGNOSIS — F32A Depression, unspecified: Secondary | ICD-10-CM

## 2021-12-04 DIAGNOSIS — K219 Gastro-esophageal reflux disease without esophagitis: Secondary | ICD-10-CM | POA: Diagnosis not present

## 2021-12-04 DIAGNOSIS — R5383 Other fatigue: Secondary | ICD-10-CM

## 2021-12-04 LAB — CBC WITH DIFFERENTIAL/PLATELET
Basophils Absolute: 0 10*3/uL (ref 0.0–0.1)
Basophils Relative: 0.4 % (ref 0.0–3.0)
Eosinophils Absolute: 0.2 10*3/uL (ref 0.0–0.7)
Eosinophils Relative: 2.6 % (ref 0.0–5.0)
HCT: 41.5 % (ref 36.0–46.0)
Hemoglobin: 13.7 g/dL (ref 12.0–15.0)
Lymphocytes Relative: 29.2 % (ref 12.0–46.0)
Lymphs Abs: 1.8 10*3/uL (ref 0.7–4.0)
MCHC: 33.1 g/dL (ref 30.0–36.0)
MCV: 91.2 fl (ref 78.0–100.0)
Monocytes Absolute: 0.5 10*3/uL (ref 0.1–1.0)
Monocytes Relative: 8.4 % (ref 3.0–12.0)
Neutro Abs: 3.7 10*3/uL (ref 1.4–7.7)
Neutrophils Relative %: 59.4 % (ref 43.0–77.0)
Platelets: 247 10*3/uL (ref 150.0–400.0)
RBC: 4.54 Mil/uL (ref 3.87–5.11)
RDW: 14 % (ref 11.5–15.5)
WBC: 6.2 10*3/uL (ref 4.0–10.5)

## 2021-12-04 LAB — COMPREHENSIVE METABOLIC PANEL
ALT: 20 U/L (ref 0–35)
AST: 25 U/L (ref 0–37)
Albumin: 4.2 g/dL (ref 3.5–5.2)
Alkaline Phosphatase: 53 U/L (ref 39–117)
BUN: 15 mg/dL (ref 6–23)
CO2: 31 mEq/L (ref 19–32)
Calcium: 9.4 mg/dL (ref 8.4–10.5)
Chloride: 99 mEq/L (ref 96–112)
Creatinine, Ser: 0.74 mg/dL (ref 0.40–1.20)
GFR: 75.1 mL/min (ref >60.00)
Glucose, Bld: 101 mg/dL — ABNORMAL HIGH (ref 70–99)
Potassium: 3.8 mEq/L (ref 3.5–5.1)
Sodium: 136 mEq/L (ref 135–145)
Total Bilirubin: 1.1 mg/dL (ref 0.2–1.2)
Total Protein: 6.6 g/dL (ref 6.0–8.3)

## 2021-12-04 LAB — URINALYSIS, ROUTINE W REFLEX MICROSCOPIC
Bilirubin Urine: NEGATIVE
Hgb urine dipstick: NEGATIVE
Ketones, ur: 15 — AB
Leukocytes,Ua: NEGATIVE
Nitrite: NEGATIVE
Specific Gravity, Urine: 1.02 (ref 1.000–1.030)
Total Protein, Urine: NEGATIVE
Urine Glucose: NEGATIVE
Urobilinogen, UA: 0.2 (ref 0.0–1.0)
pH: 6 (ref 5.0–8.0)

## 2021-12-04 LAB — TSH: TSH: 1.39 u[IU]/mL (ref 0.35–5.50)

## 2021-12-04 MED ORDER — FAMOTIDINE 40 MG PO TABS: 40.0000 mg | ORAL_TABLET | Freq: Every day | ORAL | 1 refills | Status: DC

## 2021-12-04 NOTE — Assessment & Plan Note (Signed)
Acute Has a history of anxiety and depression, but recently has been experiencing increased anxiety and depression related to numerous events that have made her very upset and on edge She did call the other day and we have restarted fluoxetine 10 mg daily, which is what she was on before We will continue this on a daily basis and have her follow-up in 4 weeks, sooner if needed Discussed that we can increase the dose if needed

## 2021-12-04 NOTE — Assessment & Plan Note (Addendum)
Acute Having nausea, burping and gagging, which is likely all related to GERD She did have some GI symptoms in the past when she needed heart surgery, but does see cardiology on Monday and will discuss with him Will start Pepcid 40 mg daily Follow-up in 4 weeks

## 2021-12-04 NOTE — Patient Instructions (Addendum)
°  Blood work was ordered.     Medications changes include :   start famotidine 40 mg daily for your stomach. Continue the fluoxetine - you can switch this to the morning.    Your prescription(s) have been submitted to your pharmacy. Please take as directed and contact our office if you believe you are having problem(s) with the medication(s).    Please followup in 4 weeks

## 2021-12-07 ENCOUNTER — Other Ambulatory Visit: Payer: Self-pay | Admitting: Internal Medicine

## 2021-12-07 ENCOUNTER — Encounter: Payer: Self-pay | Admitting: Cardiovascular Disease

## 2021-12-07 ENCOUNTER — Ambulatory Visit (INDEPENDENT_AMBULATORY_CARE_PROVIDER_SITE_OTHER): Payer: Medicare Other | Admitting: Cardiovascular Disease

## 2021-12-07 VITALS — BP 150/74 | HR 54 | Ht 63.0 in | Wt 129.2 lb

## 2021-12-07 DIAGNOSIS — I350 Nonrheumatic aortic (valve) stenosis: Secondary | ICD-10-CM | POA: Diagnosis not present

## 2021-12-07 DIAGNOSIS — I5032 Chronic diastolic (congestive) heart failure: Secondary | ICD-10-CM

## 2021-12-07 DIAGNOSIS — I10 Essential (primary) hypertension: Secondary | ICD-10-CM | POA: Diagnosis not present

## 2021-12-07 DIAGNOSIS — I25119 Atherosclerotic heart disease of native coronary artery with unspecified angina pectoris: Secondary | ICD-10-CM

## 2021-12-07 DIAGNOSIS — E782 Mixed hyperlipidemia: Secondary | ICD-10-CM | POA: Diagnosis not present

## 2021-12-07 MED ORDER — AMLODIPINE BESYLATE 5 MG PO TABS: 5.0000 mg | ORAL_TABLET | Freq: Every day | ORAL | 3 refills | Status: DC

## 2021-12-07 NOTE — Patient Instructions (Signed)
Medication Instructions:  START Amlodipine (Norvasc) 5mg  daily *If you need a refill on your cardiac medications before your next appointment, please call your pharmacy*   Lab Work: NONE If you have labs (blood work) drawn today and your tests are completely normal, you will receive your results only by: [place] (if you have MyChart) OR A paper copy in the mail If you have any lab test that is abnormal or we need to change your treatment, we will call you to review the results.   Testing/Procedures: Myoview Stress test Your physician has requested that you have a lexiscan myoview. For further information please visit [email or web address]. Please follow instruction sheet, as given.    Follow-Up: At [facility], you and your health needs are our priority.  As part of our continuing mission to provide you with exceptional heart care, we have created designated Provider Care Teams.  These Care Teams include your primary Cardiologist (physician) and Advanced Practice Providers (APPs -  Physician Assistants and Nurse Practitioners) who all work together to provide you with the care you need, when you need it.   Your next appointment:   6 month(s)  The format for your next appointment:   In Person  Provider:   [name], MD

## 2021-12-07 NOTE — Progress Notes (Signed)
Cardiology Office Note:    Date:  12/07/2021   ID:  Aman, Batley 08-14-1941, MRN [id number]  PCP:  [name], MD   [facility] Providers Cardiologist:  [name], MD     Referring MD: [name], MD   Chief Complaint  Patient presents with   Coronary Artery Disease    History of Present Illness:    [name] is a 81 y.o. female with a hx of coronary artery disease and aortic stenosis, presenting for follow-up evaluation.  The patient has multivessel CAD and was treated with CABG in 2007.  Cardiac catheterization in 2011 demonstrated patency of all of her bypass grafts.  She has been more recently followed for moderate aortic stenosis with serial echo studies.    The patient is here alone today.  She has noted more burping and belching of late.  She also felt like she had a panic attack last week.  Some of the symptoms were present when she had crescendo angina predating her bypass surgery many years ago.  She denies any chest pain or pressure at this time.  She has exertional dyspnea but reports no recent change in this.  She has not been as active as in the past.  No orthopnea, PND, leg swelling.  Past Medical History:  Diagnosis Date   ALLERGIC RHINITIS    Allergy    SEASONAL   Anxiety    Aortic stenosis, moderate    Blood transfusion without reported diagnosis    BREAST CANCER 1991   left, 1991; B mastectomy   Cataract    BILATERAL-REMOVED   Coronary artery disease    s/p CABGx5 2007 //Nuclear stress test 10/18: EF 76, no ischemia or infarction, low risk   Depression    DIVERTICULOSIS    DVT    EROSIVE GASTRITIS    GERD    Heart murmur    History of DVT (deep vein thrombosis)    HYPERLIPIDEMIA    Hypertension    HYPOTHYROIDISM    Occlusion and stenosis of carotid artery without mention of cerebral infarction    Osteopenia    OSTEOPOROSIS    RHINOSINUSITIS, RECURRENT     Past Surgical History:  Procedure Laterality Date   APPENDECTOMY      COLONOSCOPY     CORONARY ARTERY BYPASS GRAFT     5        2007   MASTECTOMY     left with reconstruction and lymph node excision  1992   MASTECTOMY     right with reconstruction   REVISION RECONSTRUCTED BREAST Left 02/2014   willard (plastics in [place])   right ankle arthroscopy     TEAR DUCT CANAL     TONSILLECTOMY AND ADENOIDECTOMY     UPPER GASTROINTESTINAL ENDOSCOPY     VAGINAL HYSTERECTOMY     ovaries not excised    Current Medications: Current Meds  Medication Sig   amLODipine (NORVASC) 5 MG tablet Take 1 tablet (5 mg total) by mouth daily.   aspirin 81 MG tablet Take 81 mg by mouth daily.     atorvastatin (LIPITOR) 20 MG tablet TAKE 1 TABLET BY MOUTH EVERY DAY   Calcium Carbonate (CALTRATE 600 PO) Take 1 tablet by mouth 2 (two) times daily.     Coenzyme Q10 (CO Q-10 PO) Take 50 mg by mouth daily.     famotidine (PEPCID) 40 MG tablet Take 1 tablet (40 mg total) by mouth daily.   FLUoxetine (PROZAC)  10 MG capsule Take 1 capsule (10 mg total) by mouth daily.   levothyroxine (SYNTHROID) 75 MCG tablet TAKE 1 TABLET BY MOUTH EVERY DAY BEFORE BREAKFAST   meloxicam (MOBIC) 7.5 MG tablet TAKE 1/2-1 TABLETS (3.75-7.5 MG TOTAL) BY MOUTH DAILY.   metoprolol tartrate (LOPRESSOR) 25 MG tablet TAKE 1 TABLET BY MOUTH TWICE A DAY   MULTIPLE VITAMIN PO Take 1 tablet by mouth daily.   senna-docusate (SENOKOT-S) 8.6-50 MG per tablet Take 1 tablet by mouth daily.     VITAMIN D, CHOLECALCIFEROL, PO Take by mouth.   zolpidem (AMBIEN) 10 MG tablet Take 0.5 tablets (5 mg total) by mouth at bedtime.     Allergies:   Clarithromycin, Clindamycin, Codeine, Levofloxacin, Morphine, Naproxen, Penicillins, and Pneumococcal vaccines   Social History   Socioeconomic History   Marital status: Divorced    Spouse name: Not on file   Number of children: 2   Years of education: Not on file   Highest education level: Not on file  Occupational History   Occupation: retired  Tobacco Use   Smoking  status: Never   Smokeless tobacco: Never  Vaping Use   Vaping Use: Never used  Substance and Sexual Activity   Alcohol use: No    Alcohol/week: 0.0 standard drinks   Drug use: No   Sexual activity: Not Currently  Other Topics Concern   Not on file  Social History Narrative   Divorced x 3, lives alone   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, Teaching laboratory technician   Social Determinants of Radio broadcast assistant Strain: Low Risk    Difficulty of Paying Living Expenses: Not hard at all  Food Insecurity: No Food Insecurity   Worried About Charity fundraiser in the Last Year: Never true   Arboriculturist in the Last Year: Never true  Transportation Needs: No Transportation Needs   Lack of Transportation (Medical): No   Lack of Transportation (Non-Medical): No  Physical Activity: Sufficiently Active   Days of Exercise per Week: 7 days   Minutes of Exercise per Session: 30 min  Stress: No Stress Concern Present   Feeling of Stress : Not at all  Social Connections: Moderately Integrated   Frequency of Communication with Friends and Family: More than three times a week   Frequency of Social Gatherings with Friends and Family: More than three times a week   Attends Religious Services: More than 4 times per year   Active Member of [employer] or Organizations: Yes   Attends Music therapist: More than 4 times per year   Marital Status: Divorced     Family History: The patient's family history includes Breast cancer in her mother; Colon cancer in her sister and sister; Emphysema in her father; Lung disease in her mother. There is no history of Esophageal cancer, Rectal cancer, Stomach cancer, or Thyroid disease.  ROS:   Please see the history of present illness.    All other systems reviewed and are negative.  EKGs/Labs/Other Studies Reviewed:    The following studies were reviewed today: Echo 11/24/21:  1. Left ventricular ejection fraction, by estimation, is  60 to 65%. The  left ventricle has normal function. The left ventricle has no regional  wall motion abnormalities. There is moderate asymmetric left ventricular  hypertrophy of the basal-septal  segment. Left ventricular diastolic parameters are consistent with Grade  II diastolic dysfunction (pseudonormalization).   2. Right ventricular systolic function is normal.  The right ventricular  size is normal. There is normal pulmonary artery systolic pressure.   3. The mitral valve is normal in structure. No evidence of mitral valve  regurgitation. No evidence of mitral stenosis.   4. The aortic valve is calcified. There is mild calcification of the  aortic valve. There is mild thickening of the aortic valve. Aortic valve  regurgitation is trivial. Mild to moderate aortic valve stenosis. Aortic  valve mean gradient measures 19.0  mmHg. Aortic valve Vmax measures 2.78 m/s.   5. Pulmonic valve regurgitation is moderate.   6. The inferior vena cava is normal in size with greater than 50%  respiratory variability, suggesting right atrial pressure of 3 mmHg.   Comparison(s): No significant change from prior study. Prior images  reviewed side by side. AV mean gradient prior 20 mmHg.   EKG:  EKG is ordered today.  The ekg ordered today demonstrates sinus bradycardia 54 bpm, within normal limits  Recent Labs: 12/04/2021: ALT 20; BUN 15; Creatinine, Ser 0.74; Hemoglobin 13.7; Platelets 247.0; Potassium 3.8; Sodium 136; TSH 1.39  Recent Lipid Panel    Component Value Date/Time   CHOL 126 08/04/2021 1445   CHOL 134 07/28/2018 1153   TRIG 69.0 08/04/2021 1445   HDL 60.10 08/04/2021 1445   HDL 52 07/28/2018 1153   CHOLHDL 2 08/04/2021 1445   VLDL 13.8 08/04/2021 1445   LDLCALC 52 08/04/2021 1445   LDLCALC 71 07/30/2020 1631     Risk Assessment/Calculations:         Physical Exam:    VS:  BP (!) 150/74    Pulse (!) 54    Ht 5\' 3"  (1.6 m)    Wt 129 lb 3.2 oz (58.6 kg)    SpO2 99%    BMI  22.89 kg/m     Wt Readings from Last 3 Encounters:  12/07/21 129 lb 3.2 oz (58.6 kg)  12/04/21 129 lb (58.5 kg)  08/31/21 131 lb 3.2 oz (59.5 kg)     GEN:  Well nourished, well developed in no acute distress HEENT: Normal NECK: No JVD; Bilateral carotid bruits LYMPHATICS: No lymphadenopathy CARDIAC: RRR, 2/6 harsh early peaking systolic murmur at the RUSB RESPIRATORY:  Clear to auscultation without rales, wheezing or rhonchi  ABDOMEN: Soft, non-tender, non-distended MUSCULOSKELETAL:  No edema; No deformity  SKIN: Warm and dry NEUROLOGIC:  Alert and oriented x 3 PSYCHIATRIC:  Normal affect   ASSESSMENT:    1. Coronary artery disease involving native coronary artery of native heart with angina pectoris ([place])   2. Aortic stenosis, moderate   3. Essential hypertension   4. Mixed hyperlipidemia   5. Chronic diastolic heart failure (HCC)    PLAN:    In order of problems listed above:  The patient does not have symptoms of typical angina with chest pain or pressure.  However, she describes increased belching and burping.  She recalls the same symptoms she had prior to her emergent CABG surgery.  I went back and reviewed those notes and she in fact did present with similar symptoms at that time.  I have recommended a Lexiscan Myoview stress test for further ischemic evaluation.  She was found to have patent bypass grafts at time of her last heart catheterization, but this was 12 years ago.  She otherwise will continue her current medical program which includes aspirin for antiplatelet therapy, beta-blocker, and high intensity statin drug. Recent echo reviewed.  Mean transaortic gradient is 20 mmHg.  This demonstrates stable mild  to moderate aortic stenosis and she will be continued on her current surveillance program with annual echo studies. Blood pressure is elevated.  On my recheck her blood pressure is 155/70 mmHg.  I have recommended adding amlodipine 5 mg daily. Treated with  atorvastatin.  LDL cholesterol is 52 mg/dL, HDL of 60.  Transaminases are normal with an AST of 19. Patient with chronic dyspnea, New York Heart Association functional class II symptoms.  No clear evidence of volume overload on exam.  Continue current management, control blood pressure as above.  Noted to have grade 2 diastolic dysfunction on her echo study.      Shared Decision Making/Informed Consent The risks [chest pain, shortness of breath, cardiac arrhythmias, dizziness, blood pressure fluctuations, myocardial infarction, stroke/transient ischemic attack, nausea, vomiting, allergic reaction, radiation exposure, metallic taste sensation and life-threatening complications (estimated to be 1 in 10,000)], benefits (risk stratification, diagnosing coronary artery disease, treatment guidance) and alternatives of a nuclear stress test were discussed in detail with [name] and she agrees to proceed.    Medication Adjustments/Labs and Tests Ordered: Current medicines are reviewed at length with the patient today.  Concerns regarding medicines are outlined above.  Orders Placed This Encounter  Procedures   Cardiac Stress Test: Informed Consent Details: Physician/Practitioner Attestation; Transcribe to consent form and obtain patient signature   MYOCARDIAL PERFUSION IMAGING   EKG 12-Lead   Meds ordered this encounter  Medications   amLODipine (NORVASC) 5 MG tablet    Sig: Take 1 tablet (5 mg total) by mouth daily.    Dispense:  90 tablet    Refill:  3    Patient Instructions  Medication Instructions:  START Amlodipine (Norvasc) 5mg  daily *If you need a refill on your cardiac medications before your next appointment, please call your pharmacy*   Lab Work: NONE If you have labs (blood work) drawn today and your tests are completely normal, you will receive your results only by: [place] (if you have MyChart) OR A paper copy in the mail If you have any lab test that is abnormal or  we need to change your treatment, we will call you to review the results.   Testing/Procedures: Myoview Stress test Your physician has requested that you have a lexiscan myoview. For further information please visit [email or web address]. Please follow instruction sheet, as given.    Follow-Up: At [facility], you and your health needs are our priority.  As part of our continuing mission to provide you with exceptional heart care, we have created designated Provider Care Teams.  These Care Teams include your primary Cardiologist (physician) and Advanced Practice Providers (APPs -  Physician Assistants and Nurse Practitioners) who all work together to provide you with the care you need, when you need it.   Your next appointment:   6 month(s)  The format for your next appointment:   In Person  Provider:   [name], MD    Signed, [name], MD  12/07/2021 4:59 PM    [place]

## 2021-12-10 ENCOUNTER — Emergency Department (HOSPITAL_BASED_OUTPATIENT_CLINIC_OR_DEPARTMENT_OTHER): Payer: Medicare Other | Admitting: Radiology

## 2021-12-10 ENCOUNTER — Encounter (HOSPITAL_BASED_OUTPATIENT_CLINIC_OR_DEPARTMENT_OTHER): Payer: Self-pay

## 2021-12-10 ENCOUNTER — Telehealth: Payer: Self-pay | Admitting: Cardiology

## 2021-12-10 ENCOUNTER — Telehealth: Payer: Self-pay | Admitting: Cardiovascular Disease

## 2021-12-10 ENCOUNTER — Emergency Department (HOSPITAL_BASED_OUTPATIENT_CLINIC_OR_DEPARTMENT_OTHER)
Admission: EM | Admit: 2021-12-10 | Discharge: 2021-12-11 | Disposition: A | Discharge status: Home / Self Care | Payer: Medicare Other | Attending: Emergency Medicine | Admitting: Emergency Medicine

## 2021-12-10 ENCOUNTER — Other Ambulatory Visit: Payer: Self-pay

## 2021-12-10 DIAGNOSIS — Z7982 Long term (current) use of aspirin: Secondary | ICD-10-CM | POA: Diagnosis not present

## 2021-12-10 DIAGNOSIS — E871 Hypo-osmolality and hyponatremia: Secondary | ICD-10-CM | POA: Diagnosis not present

## 2021-12-10 DIAGNOSIS — R0789 Other chest pain: Secondary | ICD-10-CM | POA: Diagnosis not present

## 2021-12-10 DIAGNOSIS — I2510 Atherosclerotic heart disease of native coronary artery without angina pectoris: Secondary | ICD-10-CM | POA: Diagnosis not present

## 2021-12-10 DIAGNOSIS — Z79899 Other long term (current) drug therapy: Secondary | ICD-10-CM | POA: Diagnosis not present

## 2021-12-10 DIAGNOSIS — I10 Essential (primary) hypertension: Secondary | ICD-10-CM

## 2021-12-10 LAB — BASIC METABOLIC PANEL
Anion gap: 10 (ref 5–15)
BUN: 15 mg/dL (ref 8–23)
CO2: 25 mmol/L (ref 22–32)
Calcium: 9.9 mg/dL (ref 8.9–10.3)
Chloride: 98 mmol/L (ref 98–111)
Creatinine, Ser: 0.64 mg/dL (ref 0.44–1.00)
GFR, Estimated: >60 mL/min (ref >60)
Glucose, Bld: 111 mg/dL — ABNORMAL HIGH (ref 70–99)
Potassium: 3.7 mmol/L (ref 3.5–5.1)
Sodium: 133 mmol/L — ABNORMAL LOW (ref 135–145)

## 2021-12-10 LAB — CBC
HCT: 44.8 % (ref 36.0–46.0)
Hemoglobin: 14.7 g/dL (ref 12.0–15.0)
MCH: 29.9 pg (ref 26.0–34.0)
MCHC: 32.8 g/dL (ref 30.0–36.0)
MCV: 91.2 fL (ref 80.0–100.0)
Platelets: 304 10*3/uL (ref 150–400)
RBC: 4.91 MIL/uL (ref 3.87–5.11)
RDW: 13.2 % (ref 11.5–15.5)
WBC: 7.5 10*3/uL (ref 4.0–10.5)
nRBC: 0 % (ref 0.0–0.2)

## 2021-12-10 LAB — TROPONIN I (HIGH SENSITIVITY): Troponin I (High Sensitivity): 11 ng/L (ref <18)

## 2021-12-10 NOTE — Discharge Instructions (Signed)
Please check your blood pressure once a day.  Keep a record of it and take that record with you when you see your primary care provider or your cardiologist.  If your blood pressure is very elevated at home, recheck it in 30-60 minutes.  At any point, if you are having any concerning symptoms, please return to the emergency department to be reevaluated.  Please keep your appointment for the [facility] test.

## 2021-12-10 NOTE — Telephone Encounter (Signed)
Patient called in to the on-call line reporting that her blood pressures remain significantly elevated.  Most recent 197/76, 65 bpm.  She continues to have belching, anxiety, some discomfort in the chest.  She is quite concerned that despite taking her blood pressure medications her BP has not improved.  I advised for her to take an extra amlodipine 5 mg to total 10 mg as well as continue her metoprolol 25 mg twice daily.  Given her concern that this is similar to her presentation when she required bypass surgery I advised that she be seen in the ED for further evaluation.  Patient was agreeable to take additional blood pressure medication and be seen this evening.  She thanked me for follow-up call

## 2021-12-10 NOTE — Telephone Encounter (Signed)
Spoke with the patient who reports that her blood pressure is elevated. She was started on amlodipine 5 mg daily on 1/23. She has been taking it nightly. She reports blood pressures of 161/67 and 173/74. She reports that she is fatigued. She denies chest pain or dizziness. She does have shortness of breath with exertion. Patient also complains of nausea. She states that she did throw up her coffee this morning. Her primary care started her on Pepcid but she does not think that it is helping. Advised patient to make sure that she is staying hydrated. She is scheduled for a stress test 12/17/21. Patient takes Lopressor BID, she has not taken her evening dose which I advised her to go ahead and take. She will continue to monitor and let us know if blood pressure does not improve or symptoms worsen.

## 2021-12-10 NOTE — ED Notes (Signed)
Pt ambulated self to bathroom to provide urine specimen. Pt's gait steady. Urine specimen obtained and sent to lab. Pt returned to room and was placed back on cardiac monitor.

## 2021-12-10 NOTE — Telephone Encounter (Signed)
Pt c/o BP issue: STAT if pt c/o blurred vision, one-sided weakness or slurred speech  1. What are your last 5 BP readings? 161/67 at 3:30 later it was 173/74  2. Are you having any other symptoms (ex. Dizziness, headache, blurred vision, passed out)? No, get a little short of breath and  throwing up some  and no energy  3. What is your BP issue?  High blood pressure     New Message:  Dr Burt Knack she said was concerned about her blood pressure on Monday being at 150. She said she called back to let him know itis even higher now.

## 2021-12-10 NOTE — ED Triage Notes (Signed)
Patient here POV from Home with Friend for HTN.  Patient states she was seen by PCP for Burping recently on 12/04/21. Patient concerned regarding several BP Assessments of 197/80 this PM.  Patient scheduled for Stress Test in February.   Norvasc at 2015 today. SOB this PM.  NAD Noted during Triage. A&Ox4. GCS 15. Ambulatory.

## 2021-12-10 NOTE — ED Provider Notes (Signed)
[facility] EMERGENCY DEPT Provider Note   CSN: [id number] Arrival date & time: 12/10/21  2035     History  Chief Complaint  Patient presents with   Hypertension    [name] is a 81 y.o. female.  The history is provided by the patient.  Hypertension She has history of hypertension, aortic stenosis, coronary artery disease, DVT not currently on anticoagulants comes in because of elevated blood pressure at home.  She had a friend bring over a blood pressure machine and it read 197/80.  She was concerned about this, so called her cardiologist.  She has been having belching with concerned that it was an anginal equivalent as she had similar symptoms before bypass surgery, so she was told to come to the ED to be evaluated for possible ACS.  She is scheduled for an outpatient Lexiscan Myoview test.  She denies dyspnea, nausea, diaphoresis.   Home Medications Prior to Admission medications   Medication Sig Start Date End Date Taking? Authorizing Provider  amLODipine (NORVASC) 5 MG tablet Take 1 tablet (5 mg total) by mouth daily. 12/07/21   [name], MD  aspirin 81 MG tablet Take 81 mg by mouth daily.      [provider]  atorvastatin (LIPITOR) 20 MG tablet TAKE 1 TABLET BY MOUTH EVERY DAY 08/10/21   [name], MD  Calcium Carbonate (CALTRATE 600 PO) Take 1 tablet by mouth 2 (two) times daily.      [provider]  Coenzyme Q10 (CO Q-10 PO) Take 50 mg by mouth daily.      [provider]  famotidine (PEPCID) 40 MG tablet Take 1 tablet (40 mg total) by mouth daily. 12/04/21   [name], MD  FLUoxetine (PROZAC) 10 MG capsule Take 1 capsule (10 mg total) by mouth daily. 11/30/21   [name], MD  furosemide (LASIX) 20 MG tablet Take 20 mg by mouth. Per patient taking 1/2 tab of 20 mg tablet Patient not taking: Reported on 12/07/2021    [provider]  levothyroxine (SYNTHROID) 75 MCG tablet TAKE 1 TABLET BY MOUTH EVERY  DAY BEFORE BREAKFAST 09/14/21   Burns, Claudina Lick, MD  meloxicam (MOBIC) 7.5 MG tablet TAKE 1/2-1 TABLETS (3.75-7.5 MG TOTAL) BY MOUTH DAILY. 06/22/21   [name], MD  metoprolol tartrate (LOPRESSOR) 25 MG tablet TAKE 1 TABLET BY MOUTH TWICE A DAY 12/08/21   [name], MD  MULTIPLE VITAMIN PO Take 1 tablet by mouth daily.    [provider]  senna-docusate (SENOKOT-S) 8.6-50 MG per tablet Take 1 tablet by mouth daily.      [provider]  VITAMIN D, CHOLECALCIFEROL, PO Take by mouth.    [provider]  zolpidem (AMBIEN) 10 MG tablet Take 0.5 tablets (5 mg total) by mouth at bedtime. 08/04/21   [name], MD      Allergies    Clarithromycin, Clindamycin, Codeine, Levofloxacin, Morphine, Naproxen, Penicillins, and Pneumococcal vaccines    Review of Systems   Review of Systems  All other systems reviewed and are negative.  Physical Exam Updated Vital Signs BP (!) 151/79    Pulse 70    Temp 97.6 F (36.4 C) (Oral)    Resp 18    Ht 5\' 3"  (1.6 m)    Wt 58.6 kg    SpO2 99%    BMI 22.88 kg/m  Physical Exam Vitals reviewed.  81 year old female, resting comfortably and in no  acute distress. Vital signs are significant for elevated blood pressure. Oxygen saturation is 99%, which is normal. Head is normocephalic and atraumatic. PERRLA, EOMI. Oropharynx is clear. Neck is nontender and supple without adenopathy or JVD. Back is nontender and there is no CVA tenderness. Lungs are clear without rales, wheezes, or rhonchi. Chest is nontender. Heart has regular rate and rhythm with 2/6 harsh systolic murmur heard at the aortic area consistent with aortic stenosis. Abdomen is soft, flat, nontender. Extremities have no cyanosis or edema, full range of motion is present. Skin is warm and dry without rash. Neurologic: Mental status is normal, cranial nerves are intact, moves all extremities equally.  ED Results / Procedures / Treatments   Labs (all labs ordered are  listed, but only abnormal results are displayed) Labs Reviewed  BASIC METABOLIC PANEL - Abnormal; Notable for the following components:      Result Value   Sodium 133 (*)    Glucose, Bld 111 (*)    All other components within normal limits  CBC  TROPONIN I (HIGH SENSITIVITY)  TROPONIN I (HIGH SENSITIVITY)    EKG EKG Interpretation  Date/Time:  Thursday December 10 2021 20:56:24 EST Ventricular Rate:  62 PR Interval:  166 QRS Duration: 76 QT Interval:  418 QTC Calculation: 424 R Axis:   41 Text Interpretation: Normal sinus rhythm Anterior infarct , age undetermined Abnormal ECG When compared with ECG of 05-Aug-2017 11:36, No significant change was found Confirmed by Delora Fuel ([id number]) on 12/10/2021 11:29:47 PM  Radiology DG Chest 2 View  Result Date: 12/10/2021 CLINICAL DATA:  Chest tightness. EXAM: CHEST - 2 VIEW COMPARISON:  Chest x-ray 08/05/2017. FINDINGS: Sternotomy wires and mediastinal clips are again seen. Left axillary surgical clips are again seen. The cardiomediastinal silhouette is within normal limits. There is no focal lung consolidation, pleural effusion or pneumothorax identified. No acute fractures are seen. IMPRESSION: No active cardiopulmonary disease. Electronically Signed   By: Ronney Asters M.D.   On: 12/10/2021 21:25    Procedures Procedures  Cardiac monitor shows normal sinus rhythm per my interpretation.  Medications Ordered in ED Medications - No data to display  ED Course/ Medical Decision Making/ A&P                           Medical Decision Making Amount and/or Complexity of Data Reviewed Labs: ordered. Radiology: ordered.   Elevated blood pressure with much more reasonable readings in the emergency department.  Readings in the emergency department are still elevated, she likely will need to have her antihypertensive regimen adjusted.  Belching, possible angina equivalent.  ECG in the ED shows no ST or T changes concerning for ischemia.   Troponin is normal.  Other labs are significant only for mild hyponatremia which is not felt to be clinically significant.  Chest x-ray shows no acute cardiopulmonary process.  I have independently viewed the images and agree with the radiologist's interpretation.  Old records are reviewed confirming recent cardiology visit with concerned that belching is an anginal equivalent and scheduled for outpatient Lexiscan Myoview test.  At this point, I have told the patient that she needs to check her blood pressure at home on a regular basis, ideally daily.  She needs to follow through with getting the outpatient [place] test.  She is discharged with strict return precautions.        Final Clinical Impression(s) / ED Diagnoses Final diagnoses:  Elevated blood pressure reading with  diagnosis of hypertension  Hyponatremia    Rx / DC Orders ED Discharge Orders     None         Delora Fuel, MD 78/67/67 2352

## 2021-12-14 ENCOUNTER — Telehealth (HOSPITAL_COMMUNITY): Payer: Self-pay

## 2021-12-14 ENCOUNTER — Telehealth: Payer: Self-pay | Admitting: Cardiology

## 2021-12-14 MED ORDER — AMLODIPINE BESYLATE 10 MG PO TABS: 10.0000 mg | ORAL_TABLET | Freq: Every day | ORAL | 3 refills | Status: DC

## 2021-12-14 NOTE — Telephone Encounter (Signed)
Follow Up:     Patient is returning Carlyle's call from today 

## 2021-12-14 NOTE — Telephone Encounter (Signed)
Reviewed ED notes. Would go ahead and increase amlodipine to 10 mg daily. Follow-up with stress test as planned. thanks

## 2021-12-14 NOTE — Telephone Encounter (Signed)
Detailed instructions left on the patient's answering machine. Asked to call back with any questions. S.Keila Turan EMTP 

## 2021-12-14 NOTE — Telephone Encounter (Signed)
Pt called not feeling any better after taking a total of 10 mg amlodipine.  She keeps burping and does not have much of an appetite.   I asked her to take an extra pepcid now then resume one daily.  She has no way to check her BP.  I also asked her to eat some food, having an empty stomach with meds may be causing issue.  If no better she should call back and if worse to go back to ER.  She was thinking the burping was symptom prior to her CABG.  She is for nuc study on Thursday.

## 2021-12-14 NOTE — Telephone Encounter (Signed)
Pt advised and verbalized understanding.

## 2021-12-14 NOTE — Telephone Encounter (Signed)
Left message for patient to call back  

## 2021-12-16 ENCOUNTER — Telehealth: Payer: Self-pay | Admitting: Internal Medicine

## 2021-12-16 ENCOUNTER — Telehealth: Payer: Self-pay | Admitting: Cardiovascular Disease

## 2021-12-16 MED ORDER — OMEPRAZOLE 40 MG PO CPDR: 40.0000 mg | DELAYED_RELEASE_CAPSULE | Freq: Every day | ORAL | 5 refills | Status: DC

## 2021-12-16 NOTE — Telephone Encounter (Signed)
Informed pt of provider's recommendations, pt expressed understanding and agreed  *see below*

## 2021-12-16 NOTE — Telephone Encounter (Signed)
Patient calling in  Says rx famotidine (PEPCID) 40 MG tablet is not working.. she is still experiencing acid reflux  Wants cb [phone]

## 2021-12-16 NOTE — Telephone Encounter (Signed)
Can stop the famotidine for now-she can hang onto it we may switch back to this once we get her symptoms controlled.  Start omeprazole 40 mg daily-prescription sent to pharmacy.  She should take this 30 minutes prior to meal.

## 2021-12-16 NOTE — Telephone Encounter (Signed)
° °  Pt said, she is have nerves and acid reflux. She is burping and gagging all the time, she wanted to ask Dr. Burt Knack if she needs to get the stress test tomorrow with her condition

## 2021-12-16 NOTE — Telephone Encounter (Signed)
Returned call to Pt.  She is concerned that she is burping and gagging and has her stress test scheduled for tomorrow.  Per review of Dr. Antionette Char note-this was her symptoms prior to her CABG.  Encouraged Pt to come to appointment.  Advised it was important to make sure symptoms were not related to her heart.  She agrees.

## 2021-12-16 NOTE — Telephone Encounter (Signed)
Message left for patient to return call to clinic. If she calls back okay to give her Dr. Quay Burow recommendations.

## 2021-12-17 ENCOUNTER — Ambulatory Visit (HOSPITAL_COMMUNITY): Payer: Medicare Other | Attending: Cardiovascular Disease

## 2021-12-17 ENCOUNTER — Telehealth: Payer: Self-pay | Admitting: Cardiovascular Disease

## 2021-12-17 ENCOUNTER — Other Ambulatory Visit: Payer: Self-pay

## 2021-12-17 DIAGNOSIS — I25119 Atherosclerotic heart disease of native coronary artery with unspecified angina pectoris: Secondary | ICD-10-CM | POA: Insufficient documentation

## 2021-12-17 LAB — MYOCARDIAL PERFUSION IMAGING
LV dias vol: 48 mL (ref 46–106)
LV sys vol: 14 mL
Nuc Stress EF: 71 %
Peak HR: 82 {beats}/min
Rest HR: 61 {beats}/min
Rest Nuclear Isotope Dose: 11 mCi
SDS: 2
SRS: 4
SSS: 6
ST Depression (mm): 0 mm
Stress Nuclear Isotope Dose: 32.9 mCi
TID: 1.19

## 2021-12-17 MED ORDER — TECHNETIUM TC 99M TETROFOSMIN IV KIT
11.0000 | PACK | Freq: Once | INTRAVENOUS | Status: AC | PRN
  Administered 2021-12-17: 11 via INTRAVENOUS
  Filled 2021-12-17: qty 11

## 2021-12-17 MED ORDER — REGADENOSON 0.4 MG/5ML IV SOLN
0.4000 mg | Freq: Once | INTRAVENOUS | Status: AC
  Administered 2021-12-17: 0.4 mg via INTRAVENOUS

## 2021-12-17 MED ORDER — TECHNETIUM TC 99M TETROFOSMIN IV KIT
32.9000 | PACK | Freq: Once | INTRAVENOUS | Status: AC | PRN
  Administered 2021-12-17: 32.9 via INTRAVENOUS
  Filled 2021-12-17: qty 33

## 2021-12-17 NOTE — Telephone Encounter (Signed)
Spoke with pt, blood pressure readings given to the patient. Questions answered to patients satisfaction. My chart deactivated per her request.

## 2021-12-17 NOTE — Telephone Encounter (Signed)
Patient was calling to get the BP readings from her stress test that was done today. She was told during the test they were good but she did not write them down.  Please advise

## 2021-12-18 ENCOUNTER — Telehealth: Payer: Self-pay | Admitting: Internal Medicine

## 2021-12-18 ENCOUNTER — Telehealth: Payer: Self-pay | Admitting: Cardiovascular Disease

## 2021-12-18 NOTE — Telephone Encounter (Signed)
Results discussed with patient. Advised patient, per Dr. Burt Knack, to follow-up with PCP regarding heartburn symptoms (may need GI referral). Patient verbalized understanding.

## 2021-12-18 NOTE — Telephone Encounter (Signed)
Patient calling in  Says she believes she is having some side effects from the   Says she is feeling nervous.. jittery.. & thinks it may be too strong  Wants to know if she can stop taking & provider prescribed something else  Please call [phone]

## 2021-12-18 NOTE — Telephone Encounter (Signed)
Medication is omeprazole (PRILOSEC) 40 MG capsule

## 2021-12-18 NOTE — Telephone Encounter (Signed)
-----   Message from [name], MD sent at 12/18/2021  8:06 AM EST ----- This is an excellent result.  There is normal cardiac function and normal blood flow to the heart.  This suggest that the patient's symptoms of heartburn are likely GI-related rather than cardiac-related.  Would follow-up with her PCP.  May need GI referral.  Thank you

## 2021-12-18 NOTE — Telephone Encounter (Signed)
Patient is returning call to discuss stress test results. 

## 2021-12-20 MED ORDER — PANTOPRAZOLE SODIUM 40 MG PO TBEC: 40.0000 mg | DELAYED_RELEASE_TABLET | Freq: Every day | ORAL | 3 refills | Status: DC

## 2021-12-20 NOTE — Telephone Encounter (Signed)
Stop omeprazole.   Start pantoprazole 40 mg daily - she may have the same side effect but hopefully not

## 2021-12-21 ENCOUNTER — Telehealth: Payer: Self-pay

## 2021-12-21 MED ORDER — FLUOXETINE HCL 20 MG PO TABS: 20.0000 mg | ORAL_TABLET | Freq: Every day | ORAL | 1 refills | Status: DC

## 2021-12-21 NOTE — Telephone Encounter (Signed)
Spoke with patient today and info given. 

## 2021-12-21 NOTE — Telephone Encounter (Signed)
Increase to 20 mg -- new rx sent to [facility]

## 2021-12-21 NOTE — Telephone Encounter (Signed)
Spoke with patient today. 

## 2021-12-21 NOTE — Telephone Encounter (Signed)
Pt is calling to request a stronger dosage of: FLUoxetine (PROZAC) 10 MG capsule  Pt states that she cannot get over the nervousness and she doesn't feel any different which is why she wants to try something stronger.  Pt CB [phone]

## 2021-12-23 ENCOUNTER — Ambulatory Visit (INDEPENDENT_AMBULATORY_CARE_PROVIDER_SITE_OTHER): Payer: Medicare Other

## 2021-12-23 ENCOUNTER — Encounter (HOSPITAL_COMMUNITY): Payer: Self-pay | Admitting: Emergency Medicine

## 2021-12-23 ENCOUNTER — Other Ambulatory Visit: Payer: Self-pay

## 2021-12-23 ENCOUNTER — Ambulatory Visit (HOSPITAL_COMMUNITY)
Admission: EM | Admit: 2021-12-23 | Discharge: 2021-12-23 | Disposition: A | Discharge status: Home / Self Care | Payer: Medicare Other | Attending: Emergency Medicine | Admitting: Emergency Medicine

## 2021-12-23 DIAGNOSIS — Z20822 Contact with and (suspected) exposure to covid-19: Secondary | ICD-10-CM | POA: Diagnosis not present

## 2021-12-23 DIAGNOSIS — I509 Heart failure, unspecified: Secondary | ICD-10-CM | POA: Diagnosis not present

## 2021-12-23 DIAGNOSIS — R609 Edema, unspecified: Secondary | ICD-10-CM | POA: Insufficient documentation

## 2021-12-23 DIAGNOSIS — R0600 Dyspnea, unspecified: Secondary | ICD-10-CM

## 2021-12-23 DIAGNOSIS — R21 Rash and other nonspecific skin eruption: Secondary | ICD-10-CM | POA: Diagnosis not present

## 2021-12-23 DIAGNOSIS — Z951 Presence of aortocoronary bypass graft: Secondary | ICD-10-CM | POA: Insufficient documentation

## 2021-12-23 DIAGNOSIS — J449 Chronic obstructive pulmonary disease, unspecified: Secondary | ICD-10-CM | POA: Diagnosis not present

## 2021-12-23 DIAGNOSIS — R6883 Chills (without fever): Secondary | ICD-10-CM | POA: Insufficient documentation

## 2021-12-23 DIAGNOSIS — T50905A Adverse effect of unspecified drugs, medicaments and biological substances, initial encounter: Secondary | ICD-10-CM | POA: Insufficient documentation

## 2021-12-23 DIAGNOSIS — R0602 Shortness of breath: Secondary | ICD-10-CM | POA: Insufficient documentation

## 2021-12-23 NOTE — Discharge Instructions (Signed)
You will get a call if tests are positive, you will not get a call if tests are negative but you can check results in MyChart if you have a MyChart account.   If your shortness of breath gets worse, please call 911 and go to the ER. Otherwise, please call Dr. Antionette Char office in the morning to discuss your symptoms.

## 2021-12-23 NOTE — ED Provider Notes (Signed)
[facility]    CSN: [id number] Arrival date & time: 12/23/21  [place]      History   Chief Complaint Chief Complaint  Patient presents with   Medication Reaction   Rash    HPI [name] is a 81 y.o. female.  Patient is a poor historian.  Patient reports excess burping which she is worried is related to heart problem as she had excess burping prior to her CABG.  However, review of records shows that she has been burping for a while now and has seen both her PCP and her cardiologist for this.  Her cardiologist office notes indicate they think it is a GI problem.  Her PCP started her on a PPI.  Patient reported it made her feel jittery and so PCP recommended changing from omeprazole to pantoprazole but patient has not done this.  She wonders if the omeprazole can be the cause of her symptoms.  Denies rash.  Is here tonight with reports of shortness of breath for the last few days (patient's not certain how many days).  She also has chills tonight and reports feeling very cold alternating with feeling very warm.  She reports that she feels ill.  She has not tested herself for COVID at home.  Patient reports she always has peripheral edema in the left lower extremity but does not typically have edema in the right lower extremity.  Review of records shows she recently saw her cardiologist, had a stable echo, and had an unremarkable stress test.  Patient denies chest pressure or chest pain.   Rash Associated symptoms: shortness of breath   Associated symptoms: no sore throat    Past Medical History:  Diagnosis Date   ALLERGIC RHINITIS    Allergy    SEASONAL   Anxiety    Aortic stenosis, moderate    Blood transfusion without reported diagnosis    BREAST CANCER 1991   left, 1991; B mastectomy   Cataract    BILATERAL-REMOVED   Coronary artery disease    s/p CABGx5 2007 //Nuclear stress test 10/18: EF 76, no ischemia or infarction, low risk   Depression    DIVERTICULOSIS     DVT    EROSIVE GASTRITIS    GERD    Heart murmur    History of DVT (deep vein thrombosis)    HYPERLIPIDEMIA    Hypertension    HYPOTHYROIDISM    Occlusion and stenosis of carotid artery without mention of cerebral infarction    Osteopenia    OSTEOPOROSIS    RHINOSINUSITIS, RECURRENT     Patient Active Problem List   Diagnosis Date Noted   Mitral regurgitation, mild-mod 08/04/2021   Acute left-sided low back pain without sciatica 07/30/2020   Cavus deformity of foot 05/01/2020   Poor balance 05/10/2018   Left ventricular diastolic dysfunction 30/86/5784   Essential hypertension 07/27/2017   Fall 03/02/2017   Osteoarthritis 02/16/2017   Urinary incontinence 02/16/2017   Chronic venous insufficiency 08/20/2016   Xiphoid pain 03/05/2016   Insomnia 01/16/2016   Anxiety and depression 12/24/2015   Seasonal and perennial allergic rhinitis 11/24/2015   Asthma, mild intermittent, well-controlled 11/24/2015   Carotid artery disease ([place]) 06/29/2011   Hypothyroidism 11/11/2010   Hyperlipidemia 11/11/2010   Senile osteoporosis 11/11/2010   RHINOSINUSITIS, RECURRENT 10/29/2008   Personal history of malignant neoplasm of breast 03/27/2008   GERD (gastroesophageal reflux disease) 03/27/2008   DIVERTICULOSIS 03/27/2008   CAD (coronary artery disease) 08/23/2007   Aortic stenosis, moderate  08/23/2007   DVT 08/23/2007    Past Surgical History:  Procedure Laterality Date   APPENDECTOMY     COLONOSCOPY     CORONARY ARTERY BYPASS GRAFT     5        2007   MASTECTOMY     left with reconstruction and lymph node excision  1992   MASTECTOMY     right with reconstruction   REVISION RECONSTRUCTED BREAST Left 02/2014   [facility]ard (plastics in [place])   right ankle arthroscopy     TEAR DUCT CANAL     TONSILLECTOMY AND ADENOIDECTOMY     UPPER GASTROINTESTINAL ENDOSCOPY     VAGINAL HYSTERECTOMY     ovaries not excised    OB History     Gravida  2   Para  2   Term      Preterm       AB      Living  2      SAB      IAB      Ectopic      Multiple      Live Births               Home Medications    Prior to Admission medications   Medication Sig Start Date End Date Taking? Authorizing Provider  amLODipine (NORVASC) 10 MG tablet Take 1 tablet (10 mg total) by mouth daily. 12/14/21   [name], MD  aspirin 81 MG tablet Take 81 mg by mouth daily.      [provider]  atorvastatin (LIPITOR) 20 MG tablet TAKE 1 TABLET BY MOUTH EVERY DAY 08/10/21   [name], MD  Calcium Carbonate (CALTRATE 600 PO) Take 1 tablet by mouth 2 (two) times daily.      [provider]  Coenzyme Q10 (CO Q-10 PO) Take 50 mg by mouth daily.      [provider]  famotidine (PEPCID) 40 MG tablet Take 1 tablet (40 mg total) by mouth daily. 12/04/21   [name], MD  FLUoxetine (PROZAC) 20 MG tablet Take 1 tablet (20 mg total) by mouth daily. 12/21/21   [name], MD  furosemide (LASIX) 20 MG tablet Take 20 mg by mouth. Per patient taking 1/2 tab of 20 mg tablet Patient not taking: Reported on 12/07/2021    [provider]  levothyroxine (SYNTHROID) 75 MCG tablet TAKE 1 TABLET BY MOUTH EVERY DAY BEFORE BREAKFAST 09/14/21   Burns, Claudina Lick, MD  meloxicam (MOBIC) 7.5 MG tablet TAKE 1/2-1 TABLETS (3.75-7.5 MG TOTAL) BY MOUTH DAILY. 06/22/21   [name], MD  metoprolol tartrate (LOPRESSOR) 25 MG tablet TAKE 1 TABLET BY MOUTH TWICE A DAY 12/08/21   [name], MD  MULTIPLE VITAMIN PO Take 1 tablet by mouth daily.    [provider]  pantoprazole (PROTONIX) 40 MG tablet Take 1 tablet (40 mg total) by mouth daily. 12/20/21   Burns, Claudina Lick, MD  senna-docusate (SENOKOT-S) 8.6-50 MG per tablet Take 1 tablet by mouth daily.      [provider]  VITAMIN D, CHOLECALCIFEROL, PO Take by mouth.    [provider]  zolpidem (AMBIEN) 10 MG tablet Take 0.5 tablets (5 mg total) by mouth at bedtime. 08/04/21   [name], MD     Family History Family History  Problem Relation Age of Onset   Colon cancer Sister    Colon cancer Sister    Breast cancer Mother  Lung disease Mother    Emphysema Father    Esophageal cancer Neg Hx    Rectal cancer Neg Hx    Stomach cancer Neg Hx    Thyroid disease Neg Hx     Social History Social History   Tobacco Use   Smoking status: Never   Smokeless tobacco: Never  Vaping Use   Vaping Use: Never used  Substance Use Topics   Alcohol use: No    Alcohol/week: 0.0 standard drinks   Drug use: No     Allergies   Clarithromycin, Clindamycin, Codeine, Levofloxacin, Morphine, Naproxen, Omeprazole, Penicillins, and Pneumococcal vaccines   Review of Systems Review of Systems  HENT:  Positive for postnasal drip. Negative for congestion and sore throat.   Respiratory:  Positive for shortness of breath. Negative for cough and chest tightness.   Cardiovascular:  Positive for leg swelling. Negative for chest pain.  Skin:  Positive for rash.    Physical Exam Triage Vital Signs ED Triage Vitals  Enc Vitals Group     BP 12/23/21 1905 (!) 166/69     Pulse Rate 12/23/21 1905 65     Resp 12/23/21 1905 18     Temp 12/23/21 1905 97.6 F (36.4 C)     Temp Source 12/23/21 1905 Oral     SpO2 12/23/21 1905 100 %     Weight 12/23/21 1907 128 lb 15.5 oz (58.5 kg)     Height 12/23/21 1907 5\' 3"  (1.6 m)     Head Circumference --      Peak Flow --      Pain Score 12/23/21 1907 0     Pain Loc --      Pain Edu? --      Excl. in [place]? --    No data found.  Updated Vital Signs BP (!) 169/85 (BP Location: Right Arm)    Pulse 70    Temp 97.9 F (36.6 C) (Oral)    Resp 18    Ht 5\' 3"  (1.6 m)    Wt 128 lb 15.5 oz (58.5 kg)    SpO2 95%    BMI 22.85 kg/m   Visual Acuity Right Eye Distance:   Left Eye Distance:   Bilateral Distance:    Right Eye Near:   Left Eye Near:    Bilateral Near:     Physical Exam Constitutional:      General: She is not in acute distress.     Appearance: Normal appearance. She is not ill-appearing.  Cardiovascular:     Rate and Rhythm: Normal rate and regular rhythm.     Heart sounds: Murmur heard.  Systolic murmur is present with a grade of 2/6.  Pulmonary:     Effort: Pulmonary effort is normal.     Breath sounds: Normal breath sounds.  Musculoskeletal:     Right lower leg: 1+ Pitting Edema present.     Left lower leg: 2+ Pitting Edema present.  Neurological:     Mental Status: She is alert.     UC Treatments / Results  Labs (all labs ordered are listed, but only abnormal results are displayed) Labs Reviewed  SARS CORONAVIRUS 2 (TAT 6-24 HRS)    EKG EKG: normal EKG, normal sinus rhythm, unchanged from previous tracings, PAC's noted. Anterior infarct, age undetermined noted on prior EKGs. No acute changes.    Radiology DG Chest 2 View  Result Date: 12/23/2021 CLINICAL DATA:  Dyspnea, lower extremity edema, CHF EXAM: CHEST - 2 VIEW COMPARISON:  None. FINDINGS: The lungs are mildly hyperinflated in keeping with changes of underlying COPD. Lungs are clear. No pneumothorax or pleural effusion. Coronary artery bypass grafting has been performed. Cardiac size within normal limits. Breast implants noted. Left axillary lymph node dissection has been performed. No acute bone abnormality. IMPRESSION: No active cardiopulmonary disease.  COPD. Electronically Signed   By: Fidela Salisbury M.D.   On: 12/23/2021 20:22    Procedures Procedures (including critical care time)  Medications Ordered in UC Medications - No data to display  Initial Impression / Assessment and Plan / UC Course  I have reviewed the triage vital signs and the nursing notes.  Pertinent labs & imaging results that were available during my care of the patient were reviewed by me and considered in my medical decision making (see chart for details).    I advised patient to go to the ER for further evaluation of her shortness of breath symptoms as she also has  2+ pitting edema on the left side and 1+ pitting edema on her right side in her legs, given her medical history.  Patient adamantly does not want to go to the emergency room.  EKG is stable compared to prior.  Chest x-ray does not show acute changes.  Discussed with patient reasons for calling 911 and seeking an emergent higher level of care.  Patient agrees to seek emergent care if her symptoms change or become more severe.  She also agrees to call her cardiologist office in the morning to discuss her symptoms with them.  Final Clinical Impressions(s) / UC Diagnoses   Final diagnoses:  Dyspnea, unspecified type  Chills     Discharge Instructions      You will get a call if tests are positive, you will not get a call if tests are negative but you can check results in MyChart if you have a MyChart account.   If your shortness of breath gets worse, please call 911 and go to the ER. Otherwise, please call Dr. Antionette Char office in the morning to discuss your symptoms.      ED Prescriptions   None    PDMP not reviewed this encounter.   Carvel Getting, NP 12/24/21 [phone]

## 2021-12-23 NOTE — ED Triage Notes (Signed)
Pt reports shortness of breath and rash x 7 days. Pt states she started taking a new medication (Omeprazole 40 mg) and symptoms began.

## 2021-12-24 ENCOUNTER — Telehealth: Payer: Self-pay | Admitting: Cardiovascular Disease

## 2021-12-24 ENCOUNTER — Telehealth: Payer: Self-pay | Admitting: Internal Medicine

## 2021-12-24 LAB — SARS CORONAVIRUS 2 (TAT 6-24 HRS): SARS Coronavirus 2: NEGATIVE

## 2021-12-24 NOTE — Telephone Encounter (Signed)
°  Pt said she went to urgent care last night, she was told by the doctor to follow up with Dr. Copper to check the test results she had yesterday, she said, the doctor also concerned about her swelling

## 2021-12-24 NOTE — Telephone Encounter (Signed)
Connected to [facility] 2.8.2023.   Caller states she had been on famotidine in January for n/v but states she feels it was not improving her symptoms. She was changed to Omeprazole 40mg  on 12/16/21, instead and states she is having improvement in her gagging and belching symptoms have improved. She states she is having worsening symptoms of nervousness and fatigue. She then states she was also placed on Prozac 10mg  on 12/01/21 for anxiety but she was increased to Prozac 20mg  12/21/21. She states her symptoms of nervousness and fatigue have been present since the beginning of January. She is wanting to know if those symptoms can be caused from her medications.   Advised to go to ED.

## 2021-12-24 NOTE — Telephone Encounter (Signed)
Returned pt call.  She begins by saying that the urgent care MD told her she needed to follow up with her cardiologist. States that something was on her CXR that he should review. Informed pt that urgent care NP note states that there is no acute changes to CXR. She then begins to say that well maybe it didn't or I didn't understand.  When asked about her leg swelling pt reports that it is only her ankles.   States this is ongoing for  a long time.  When asked what long time means pt could not tell me.  Reviewed chart that showed she was referred to follow  up with cardiology in June of last year d/t leg swelling.  LE venous doppler ordered/completed. She does report that left ankle is greater than right. Also asked pt who was following this continued swelling and she said Dr. Burt Knack.  I informed pt that I am not sure he is as his last note does not reflect anything about swelling and OV note states "no orthopnea, PND, leg swelling". Aware that I am not sure that cardiology is following this. Pt very anxious on the phone and reports that her nerves are getting to her with this conversation, states my BP is going up because of talking about this. She does report that she is not walking much at all, says she lives in a small place and that she is just weak and tired.  Aware I will forward to Dr. Burt Knack for his review of urgent care visit. Aware office will call once he reviews/advises.

## 2021-12-28 NOTE — Telephone Encounter (Signed)
Pt aware still awaiting MD review, but informed that it is still in his things to review. But reminded pt that I didn't think concerned issues are r/t or followed by cardiology. Aware we will call once he review/advises. Pt understands, agreeable to plan.

## 2021-12-28 NOTE — Telephone Encounter (Signed)
Patient is following up. Would like to know if Dr. Burt Knack has had an opportunity to review. Please advise.

## 2021-12-29 ENCOUNTER — Telehealth: Payer: Self-pay | Admitting: Cardiovascular Disease

## 2021-12-29 NOTE — Telephone Encounter (Signed)
Lm to call back ./cy 

## 2021-12-29 NOTE — Telephone Encounter (Signed)
I reviewed the patient's labs and x-ray results from her urgent care evaluation.  I do not see any concerning findings.  Would reassure her about her cardiac status after her recent stress test which was normal.  Overall I think things look okay.  If symptoms are worsening, would arrange APP visit next available APP visit or I would be happy to see her as an add-on as needed.

## 2021-12-29 NOTE — Telephone Encounter (Signed)
?  Transferred call to RN ?

## 2021-12-29 NOTE — Telephone Encounter (Signed)
Reviewed notes - agree her swelling is most likely side effect of amlodipine. Would stop amlodipine and start losartan 50 mg daily for BP. Swelling should improve within one week.

## 2021-12-29 NOTE — Telephone Encounter (Signed)
Spoke with pt and has concerns re significant swelling to left leg can hardly get shoe on Per pt also notes some swelling in right but not as much Reviewed chart and appears in January nothing was mentioned about leg swelling and pt can't remember how long this has been going on but pt was started on Amlodipine 5 mg on 12/07/21 this is side effect Pt has f/u with PCP on Friday Will forward to Dr Burt Knack for recommendations .Adonis Housekeeper

## 2021-12-30 MED ORDER — LOSARTAN POTASSIUM 50 MG PO TABS: 50.0000 mg | ORAL_TABLET | Freq: Every day | ORAL | 3 refills | Status: DC

## 2021-12-30 NOTE — Telephone Encounter (Signed)
Left detailed message (per DPR) and sent Losartan into pharmacy on file. Advised pt that per Burt Knack, swelling should subside in a week, but in meantime elevate legs as much as possible and wear compression socks.

## 2021-12-31 NOTE — Progress Notes (Signed)
Subjective:    Patient ID: [name], female    DOB: 22-Apr-1941, 81 y.o.   MRN: [id number]  This visit occurred during the SARS-CoV-2 public health emergency.  Safety protocols were in place, including screening questions prior to the visit, additional usage of staff PPE, and extensive cleaning of exam room while observing appropriate contact time as indicated for disinfecting solutions.     HPI The patient is here for follow up of their chronic medical problems, including GERD and anxiety/depression  GERD - 4 weeks ago added famotidine to her regimen for uncontrolled gerd.  She did not tolerate the famotidine and that was discontinued.  She also felt like the omeprazole was causing symptoms and we changed that to pantoprazole.  She has been to the emergency room twice for belching concerned it was possibly her heart.  Today she states she is taking the famotidine 40 mg daily and she thinks is helping.  She still has some mild symptoms and is taking Gas-X and Pepcid.   Anxiety, depression - was not controlled - restarted fluoxetine about 4 weeks ago and and we increased this to 20 mg daily at her visit because her anxiety and depression was so significant.  She did increase the dose, but did not tolerate it and went back to the 10 mg daily, which she has been taking daily.  She thinks she had been on this for years, but she was not on this until just before her last visit 4 months ago-we had discontinued it because she was doing so well.  She feels like her anxiety and depression are not controlled and the medication is not helping much.  Amlodipine was discontinued by cardiology and she is already on losartan 50 mg daily because she was experiencing leg swelling.  She states left leg swelling has been going on for a while.  Her right leg is not swollen.  She denies any leg pain.  Has lot of PND.  She is taking a generic allergy medication, but not sure which it is.  She is having  difficulty sleeping.  She has been on Ambien for years-typically taking 2.5 mg nightly.  She gets 10 mg pill and she cuts it in fourths.  That has always worked well for her.  She went to get a refill of the medication recently and the pharmacist advised her it was too early and she had to wait at least another couple of weeks.  She does not recall what happened to the medication-she states she may have thrown it out -she really does not know.    Medications and allergies reviewed with patient and updated if appropriate.  Patient Active Problem List   Diagnosis Date Noted   Mitral regurgitation, mild-mod 08/04/2021   Acute left-sided low back pain without sciatica 07/30/2020   Cavus deformity of foot 05/01/2020   Poor balance 05/10/2018   Left ventricular diastolic dysfunction 17/49/4496   Essential hypertension 07/27/2017   Fall 03/02/2017   Osteoarthritis 02/16/2017   Urinary incontinence 02/16/2017   Chronic venous insufficiency 08/20/2016   Xiphoid pain 03/05/2016   Insomnia 01/16/2016   Anxiety and depression 12/24/2015   Seasonal and perennial allergic rhinitis 11/24/2015   Asthma, mild intermittent, well-controlled 11/24/2015   Carotid artery disease ([place]) 06/29/2011   Hypothyroidism 11/11/2010   Hyperlipidemia 11/11/2010   Senile osteoporosis 11/11/2010   RHINOSINUSITIS, RECURRENT 10/29/2008   Personal history of malignant neoplasm of breast 03/27/2008   GERD (gastroesophageal reflux  disease) 03/27/2008   DIVERTICULOSIS 03/27/2008   CAD (coronary artery disease) 08/23/2007   Aortic stenosis, moderate 08/23/2007   DVT 08/23/2007    Current Outpatient Medications on File Prior to Visit  Medication Sig Dispense Refill   aspirin 81 MG tablet Take 81 mg by mouth daily.       atorvastatin (LIPITOR) 20 MG tablet TAKE 1 TABLET BY MOUTH EVERY DAY 90 tablet 1   Calcium Carbonate (CALTRATE 600 PO) Take 1 tablet by mouth 2 (two) times daily.       Coenzyme Q10 (CO Q-10 PO) Take  50 mg by mouth daily.       levothyroxine (SYNTHROID) 75 MCG tablet TAKE 1 TABLET BY MOUTH EVERY DAY BEFORE BREAKFAST 90 tablet 1   losartan (COZAAR) 50 MG tablet Take 1 tablet (50 mg total) by mouth daily. 90 tablet 3   meloxicam (MOBIC) 7.5 MG tablet TAKE 1/2-1 TABLETS (3.75-7.5 MG TOTAL) BY MOUTH DAILY. 90 tablet 0   metoprolol tartrate (LOPRESSOR) 25 MG tablet TAKE 1 TABLET BY MOUTH TWICE A DAY 180 tablet 1   MULTIPLE VITAMIN PO Take 1 tablet by mouth daily.     senna-docusate (SENOKOT-S) 8.6-50 MG per tablet Take 1 tablet by mouth daily.       VITAMIN D, CHOLECALCIFEROL, PO Take by mouth.     zolpidem (AMBIEN) 10 MG tablet Take 0.5 tablets (5 mg total) by mouth at bedtime. 15 tablet 5   No current facility-administered medications on file prior to visit.    Past Medical History:  Diagnosis Date   ALLERGIC RHINITIS    Allergy    SEASONAL   Anxiety    Aortic stenosis, moderate    Blood transfusion without reported diagnosis    BREAST CANCER 1991   left, 1991; B mastectomy   Cataract    BILATERAL-REMOVED   Coronary artery disease    s/p CABGx5 2007 //Nuclear stress test 10/18: EF 76, no ischemia or infarction, low risk   Depression    DIVERTICULOSIS    DVT    EROSIVE GASTRITIS    GERD    Heart murmur    History of DVT (deep vein thrombosis)    HYPERLIPIDEMIA    Hypertension    HYPOTHYROIDISM    Occlusion and stenosis of carotid artery without mention of cerebral infarction    Osteopenia    OSTEOPOROSIS    RHINOSINUSITIS, RECURRENT     Past Surgical History:  Procedure Laterality Date   APPENDECTOMY     COLONOSCOPY     CORONARY ARTERY BYPASS GRAFT     5        2007   MASTECTOMY     left with reconstruction and lymph node excision  1992   MASTECTOMY     right with reconstruction   REVISION RECONSTRUCTED BREAST Left 02/2014   willard (plastics in [place])   right ankle arthroscopy     TEAR DUCT CANAL     TONSILLECTOMY AND ADENOIDECTOMY     UPPER GASTROINTESTINAL  ENDOSCOPY     VAGINAL HYSTERECTOMY     ovaries not excised    Social History   Socioeconomic History   Marital status: Divorced    Spouse name: Not on file   Number of children: 2   Years of education: Not on file   Highest education level: Not on file  Occupational History   Occupation: retired  Tobacco Use   Smoking status: Never   Smokeless tobacco: Never  Media planner  Vaping Use: Never used  Substance and Sexual Activity   Alcohol use: No    Alcohol/week: 0.0 standard drinks   Drug use: No   Sexual activity: Not Currently  Other Topics Concern   Not on file  Social History Narrative   Divorced x 3, lives alone   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, Teaching laboratory technician   Social Determinants of Radio broadcast assistant Strain: Low Risk    Difficulty of Paying Living Expenses: Not hard at all  Food Insecurity: No Food Insecurity   Worried About Charity fundraiser in the Last Year: Never true   Arboriculturist in the Last Year: Never true  Transportation Needs: No Transportation Needs   Lack of Transportation (Medical): No   Lack of Transportation (Non-Medical): No  Physical Activity: Sufficiently Active   Days of Exercise per Week: 7 days   Minutes of Exercise per Session: 30 min  Stress: No Stress Concern Present   Feeling of Stress : Not at all  Social Connections: Moderately Integrated   Frequency of Communication with Friends and Family: More than three times a week   Frequency of Social Gatherings with Friends and Family: More than three times a week   Attends Religious Services: More than 4 times per year   Active Member of [employer] or Organizations: Yes   Attends Music therapist: More than 4 times per year   Marital Status: Divorced    Family History  Problem Relation Age of Onset   Colon cancer Sister    Colon cancer Sister    Breast cancer Mother    Lung disease Mother    Emphysema Father    Esophageal cancer Neg Hx     Rectal cancer Neg Hx    Stomach cancer Neg Hx    Thyroid disease Neg Hx     Review of Systems  Constitutional:  Negative for fever.  HENT:  Positive for postnasal drip. Negative for congestion, sinus pressure, sinus pain and sore throat.   Respiratory:  Negative for shortness of breath.   Cardiovascular:  Positive for palpitations and leg swelling (LLE only). Negative for chest pain.      Objective:   Vitals:   01/01/22 1425  BP: (!) 144/82  Pulse: 62  Temp: 98 F (36.7 C)  SpO2: 99%   BP Readings from Last 3 Encounters:  01/01/22 (!) 144/82  12/23/21 (!) 169/85  12/10/21 (!) 176/70   Wt Readings from Last 3 Encounters:  01/01/22 126 lb 3.2 oz (57.2 kg)  12/23/21 128 lb 15.5 oz (58.5 kg)  12/17/21 129 lb (58.5 kg)   Body mass index is 22.36 kg/m.   Physical Exam    Constitutional: Appears well-developed and well-nourished. No distress.  HENT:  Head: Normocephalic and atraumatic.  Neck: Neck supple. No tracheal deviation present. No thyromegaly present.  No cervical lymphadenopathy Cardiovascular: Normal rate, regular rhythm and normal heart sounds.   No murmur heard.   1+ left lower extremity edema without calf tenderness Pulmonary/Chest: Effort normal and breath sounds normal. No respiratory distress. No has no wheezes. No rales.  Skin: Skin is warm and dry. Not diaphoretic.  Psychiatric: Mildly depressed mood and affect.  Mild confusion     Assessment & Plan:    See Problem List for Assessment and Plan of chronic medical problems.

## 2022-01-01 ENCOUNTER — Other Ambulatory Visit: Payer: Self-pay

## 2022-01-01 ENCOUNTER — Ambulatory Visit (INDEPENDENT_AMBULATORY_CARE_PROVIDER_SITE_OTHER): Payer: Medicare Other | Admitting: Internal Medicine

## 2022-01-01 ENCOUNTER — Encounter: Payer: Self-pay | Admitting: Internal Medicine

## 2022-01-01 VITALS — BP 144/82 | HR 62 | Temp 98.0°F | Ht 63.0 in | Wt 126.2 lb

## 2022-01-01 DIAGNOSIS — M7989 Other specified soft tissue disorders: Secondary | ICD-10-CM | POA: Diagnosis not present

## 2022-01-01 DIAGNOSIS — I25119 Atherosclerotic heart disease of native coronary artery with unspecified angina pectoris: Secondary | ICD-10-CM | POA: Diagnosis not present

## 2022-01-01 DIAGNOSIS — F32A Depression, unspecified: Secondary | ICD-10-CM

## 2022-01-01 DIAGNOSIS — K219 Gastro-esophageal reflux disease without esophagitis: Secondary | ICD-10-CM

## 2022-01-01 DIAGNOSIS — G4709 Other insomnia: Secondary | ICD-10-CM

## 2022-01-01 DIAGNOSIS — F419 Anxiety disorder, unspecified: Secondary | ICD-10-CM

## 2022-01-01 MED ORDER — FLUOXETINE HCL 10 MG PO TABS: 10.0000 mg | ORAL_TABLET | Freq: Every day | ORAL | 3 refills | Status: DC

## 2022-01-01 MED ORDER — SERTRALINE HCL 25 MG PO TABS: 25.0000 mg | ORAL_TABLET | Freq: Every day | ORAL | 3 refills | Status: DC

## 2022-01-01 MED ORDER — FAMOTIDINE 40 MG PO TABS: 40.0000 mg | ORAL_TABLET | Freq: Every day | ORAL | 1 refills | Status: DC

## 2022-01-01 NOTE — Patient Instructions (Addendum)
° °  An ultrasound of your left lower leg was ordered.     Medications changes include :  stop fluoxetine 10 mg daily ( prozac) and start sertraline 25 mg daily.      Your prescription(s) have been sent to your pharmacy.     Return in about 2 weeks (around 01/15/2022) for follow up.

## 2022-01-01 NOTE — Assessment & Plan Note (Signed)
Chronic She has been on Ambien 2.5 mg nightly for a long time and it has been fairly effective Recently there has been some confusion about her medication she ran out early and she does not recall why She is not able to get it filled until possibly [place] the pharmacist said it was week after Monday She is very concerned that she is not going to be able to sleep and it can increase her anxiety, which I understand-she will try melatonin At this point I will not prescribe anything for her sleep until she tries melatonin She will go home and reevaluate her medications and at her next visit I have asked her to bring all of her medications with her

## 2022-01-01 NOTE — Assessment & Plan Note (Addendum)
Chronic Did not tolerate omeprazole or?  Pantoprazole Continue famotidine 40 mg daily Continue Tums and Gas-X as needed Her GERD symptoms are not completely controlled, but better We will follow-up in a couple of weeks

## 2022-01-01 NOTE — Assessment & Plan Note (Signed)
Acute She states she has had the swelling there for a while Only has swelling in the left lower extremity Saw cardiology and they stopped her amlodipine and started losartan without improvement We will obtain an ultrasound of the left lower extremity

## 2022-01-01 NOTE — Assessment & Plan Note (Addendum)
Chronic 1 month ago was experiencing increased anxiety and depression Restarted fluoxetine 10 mg daily, which we tried to increase and she did not tolerate it - so went back to 10 mg daily Anxiety and depression not improved Stop fluoxetine Start sertraline 25 mg daily ?  Increased confusion or possibly related to not sleeping, increased anxiety and depression-reevaluate at next visit Follow-up in 2 weeks

## 2022-01-04 ENCOUNTER — Other Ambulatory Visit: Payer: Self-pay | Admitting: Internal Medicine

## 2022-01-05 ENCOUNTER — Telehealth: Payer: Self-pay

## 2022-01-05 ENCOUNTER — Encounter (HOSPITAL_COMMUNITY): Payer: Self-pay

## 2022-01-05 NOTE — Telephone Encounter (Signed)
I guess if the swelling is gone she does not need to do the ultrasound

## 2022-01-05 NOTE — Telephone Encounter (Signed)
Pt is calling concerning the swelling of Lt leg. The leg is no longer swollen and she was calling to see if she still needed to do the Ultrasound of the lt leg as ordered.  Please advise pt [phone]

## 2022-01-06 ENCOUNTER — Telehealth: Payer: Self-pay | Admitting: Cardiovascular Disease

## 2022-01-06 NOTE — Telephone Encounter (Signed)
Patient states she is returning a call from [facility] yesterday. She says if she can't get called back today to not call until after lunch tomorrow.

## 2022-01-06 NOTE — Telephone Encounter (Signed)
Pt returned call and I advised her that Dr. Quay Burow says if the swelling is gone she does not need to do the ultrasound.  Pt understood and was glad she didn't have to do [place].  FYI

## 2022-01-06 NOTE — Telephone Encounter (Signed)
Pt advised stress test Dr Burt Knack has reviewed results and states :This is an excellent result.  There is normal cardiac function and normal blood flow to the heart.  This suggest that the patient's symptoms of heartburn are likely GI-related rather than cardiac-related.  Would follow-up with her PCP.  May need GI referral.  Thank you Pt verbalizes understanding and thanked RN for the call.

## 2022-01-06 NOTE — Telephone Encounter (Signed)
Message left for patient today 

## 2022-01-16 NOTE — Progress Notes (Signed)
? ? ? ? ?Subjective:  ? ? Patient ID: [name], female    DOB: 1941-07-11, 81 y.o.   MRN: [id number] ? ?This visit occurred during the SARS-CoV-2 public health emergency.  Safety protocols were in place, including screening questions prior to the visit, additional usage of staff PPE, and extensive cleaning of exam room while observing appropriate contact time as indicated for disinfecting solutions.   ? ? ?HPI ?[name] is here for follow up of her chronic medical problems, including GERD, anxiety/ depression ? ? ?Left leg swelling - still has left leg swelling - has been intermittent.  It did get better after she was here but then got worse again.  ? ? ?Anxiety and depression - we stopped prozac at her last visit and started sertraline. Since starting that she has heard singing in her ears.  It is the same line of the song that plays over and over.  She still feels nervous/anxious more than depression.  She denies panic attacks.   ? ? ?Insomnia - not sleeping still.  Taking 2.5 mg ambien at bedtime and then often taking another 2.5 mg later in the night.  Still not sleeping well.   ? ? ? ?Medications and allergies reviewed with patient and updated if appropriate. ? ?Current Outpatient Medications on File Prior to Visit  ?Medication Sig Dispense Refill  ? aspirin 81 MG tablet Take 81 mg by mouth daily.      ? atorvastatin (LIPITOR) 20 MG tablet TAKE 1 TABLET BY MOUTH EVERY DAY 90 tablet 1  ? Calcium Carbonate (CALTRATE 600 PO) Take 1 tablet by mouth 2 (two) times daily.      ? Coenzyme Q10 (CO Q-10 PO) Take 50 mg by mouth daily.      ? famotidine (PEPCID) 40 MG tablet Take 1 tablet (40 mg total) by mouth daily. 90 tablet 1  ? levothyroxine (SYNTHROID) 75 MCG tablet TAKE 1 TABLET BY MOUTH EVERY DAY BEFORE BREAKFAST 90 tablet 1  ? losartan (COZAAR) 50 MG tablet Take 1 tablet (50 mg total) by mouth daily. 90 tablet 3  ? meloxicam (MOBIC) 7.5 MG tablet TAKE 1/2-1 TABLETS (3.75-7.5 MG TOTAL) BY MOUTH DAILY. 90 tablet 0   ? metoprolol tartrate (LOPRESSOR) 25 MG tablet TAKE 1 TABLET BY MOUTH TWICE A DAY 180 tablet 1  ? MULTIPLE VITAMIN PO Take 1 tablet by mouth daily.    ? Multiple Vitamins-Minerals (MULTIVITAMIN) LIQD     ? senna-docusate (SENOKOT-S) 8.6-50 MG per tablet Take 1 tablet by mouth daily.      ? VITAMIN D, CHOLECALCIFEROL, PO Take by mouth.    ? zolpidem (AMBIEN) 10 MG tablet Take 0.5 tablets (5 mg total) by mouth at bedtime. 15 tablet 5  ? ?No current facility-administered medications on file prior to visit.  ? ? ? ?Review of Systems  ?Constitutional:  Positive for fatigue. Negative for fever.  ?Respiratory:  Negative for shortness of breath.   ?Cardiovascular:  Positive for leg swelling. Negative for chest pain and palpitations.  ?Neurological:  Negative for weakness, light-headedness, numbness and headaches.  ?Psychiatric/Behavioral:  Positive for sleep disturbance. The patient is nervous/anxious.   ? ?   ?Objective:  ? ?Vitals:  ? 01/18/22 1517  ?BP: 138/82  ?Pulse: 87  ?Temp: 98 ?F (36.7 ?C)  ?SpO2: 98%  ? ?BP Readings from Last 3 Encounters:  ?01/18/22 138/82  ?01/01/22 (!) 144/82  ?12/23/21 (!) 169/85  ? ?Wt Readings from Last 3 Encounters:  ?01/18/22 122 lb (  55.3 kg)  ?01/01/22 126 lb 3.2 oz (57.2 kg)  ?12/23/21 128 lb 15.5 oz (58.5 kg)  ? ?Body mass index is 21.61 kg/m?. ? ?  ?Physical Exam ?Constitutional:   ?   General: She is not in acute distress. ?   Appearance: Normal appearance.  ?HENT:  ?   Head: Normocephalic and atraumatic.  ?Eyes:  ?   Conjunctiva/sclera: Conjunctivae normal.  ?Cardiovascular:  ?   Rate and Rhythm: Normal rate and regular rhythm.  ?   Heart sounds: Murmur heard.  ?Pulmonary:  ?   Effort: Pulmonary effort is normal. No respiratory distress.  ?   Breath sounds: Normal breath sounds. No wheezing.  ?Musculoskeletal:  ?   Cervical back: Neck supple.  ?   Right lower leg: No edema.  ?   Left lower leg: Edema present.  ?Lymphadenopathy:  ?   Cervical: No cervical adenopathy.  ?Skin: ?    Findings: No rash.  ?Neurological:  ?   Mental Status: She is alert. Mental status is at baseline.  ?   Sensory: No sensory deficit.  ?   Motor: No weakness.  ?Psychiatric:     ?   Mood and Affect: Mood normal.     ?   Behavior: Behavior normal.  ? ?   ? ?Lab Results  ?Component Value Date  ? WBC 7.5 12/10/2021  ? HGB 14.7 12/10/2021  ? HCT 44.8 12/10/2021  ? PLT 304 12/10/2021  ? GLUCOSE 111 (H) 12/10/2021  ? CHOL 126 08/04/2021  ? TRIG 69.0 08/04/2021  ? HDL 60.10 08/04/2021  ? [place] 52 08/04/2021  ? ALT 20 12/04/2021  ? AST 25 12/04/2021  ? NA 133 (L) 12/10/2021  ? K 3.7 12/10/2021  ? CL 98 12/10/2021  ? CREATININE 0.64 12/10/2021  ? BUN 15 12/10/2021  ? CO2 25 12/10/2021  ? TSH 1.39 12/04/2021  ? INR 1.0 ratio 10/26/2010  ? ? ? ?Assessment & Plan:  ? ? ?See Problem List for Assessment and Plan of chronic medical problems.  ? ? ?

## 2022-01-18 ENCOUNTER — Encounter: Payer: Self-pay | Admitting: Internal Medicine

## 2022-01-18 ENCOUNTER — Ambulatory Visit (INDEPENDENT_AMBULATORY_CARE_PROVIDER_SITE_OTHER): Payer: Medicare Other | Admitting: Internal Medicine

## 2022-01-18 ENCOUNTER — Other Ambulatory Visit: Payer: Self-pay

## 2022-01-18 VITALS — BP 138/82 | HR 87 | Temp 98.0°F | Ht 63.0 in | Wt 122.0 lb

## 2022-01-18 DIAGNOSIS — F32A Depression, unspecified: Secondary | ICD-10-CM | POA: Diagnosis not present

## 2022-01-18 DIAGNOSIS — K219 Gastro-esophageal reflux disease without esophagitis: Secondary | ICD-10-CM

## 2022-01-18 DIAGNOSIS — R440 Auditory hallucinations: Secondary | ICD-10-CM | POA: Insufficient documentation

## 2022-01-18 DIAGNOSIS — M7989 Other specified soft tissue disorders: Secondary | ICD-10-CM | POA: Diagnosis not present

## 2022-01-18 DIAGNOSIS — F419 Anxiety disorder, unspecified: Secondary | ICD-10-CM

## 2022-01-18 DIAGNOSIS — I25119 Atherosclerotic heart disease of native coronary artery with unspecified angina pectoris: Secondary | ICD-10-CM | POA: Diagnosis not present

## 2022-01-18 DIAGNOSIS — G4709 Other insomnia: Secondary | ICD-10-CM | POA: Diagnosis not present

## 2022-01-18 MED ORDER — CITALOPRAM HYDROBROMIDE 10 MG PO TABS: 10.0000 mg | ORAL_TABLET | Freq: Every day | ORAL | 3 refills | Status: DC

## 2022-01-18 NOTE — Assessment & Plan Note (Signed)
Acute ?Has been hearing songs in her ears - since starting sertraline ?? Side effect vs beginning of dementia ?No neuro deficits ?Will stop sertraline and have her f/u in 2 weeks ?May need imaging and neuro referral ?

## 2022-01-18 NOTE — Assessment & Plan Note (Signed)
Chronic ?Anxiety is a little better ?Has had songs playing in her ears since starting sertraline - would like to stop it - will d/c it ?Would like to try what her friend is on  ?Start celexa 10 mg daily ?F/u in 2 weeks ?

## 2022-01-18 NOTE — Assessment & Plan Note (Signed)
Subacute ?Intermittent ?Will get LLE edema to r/o DVT which seems unlikely ?

## 2022-01-18 NOTE — Patient Instructions (Addendum)
? ? ? ? ? ?  Medications changes include :   stop sertraline.  Start citalopram 10 mg daily.  Take ambien 5 mg at night.  ? ? ?Your prescription(s) have been sent to your pharmacy.  ? ? ? ?Return for follow up as scheduled. ? ?

## 2022-01-18 NOTE — Assessment & Plan Note (Signed)
Chronic ?Not controlled ?Increase to ambien to 5 mg at bedtime - typically taking 2.5 mg at bedtime and 2.5 mg later in the night ?

## 2022-01-20 ENCOUNTER — Ambulatory Visit (HOSPITAL_COMMUNITY)
Admission: RE | Admit: 2022-01-20 | Discharge: 2022-01-20 | Disposition: A | Discharge status: Home / Self Care | Payer: Medicare Other | Source: Ambulatory Visit | Attending: Cardiovascular Disease | Admitting: Cardiovascular Disease

## 2022-01-20 ENCOUNTER — Other Ambulatory Visit: Payer: Self-pay

## 2022-01-20 DIAGNOSIS — M7989 Other specified soft tissue disorders: Secondary | ICD-10-CM | POA: Diagnosis not present

## 2022-01-31 ENCOUNTER — Encounter: Payer: Self-pay | Admitting: Internal Medicine

## 2022-01-31 NOTE — Patient Instructions (Addendum)
? ? ? ? ? ? ?  Medications changes include :   none ? ? ?Return in about 6 months (around 08/04/2022) for follow up. ? ?

## 2022-01-31 NOTE — Progress Notes (Signed)
? ? ? ? ?Subjective:  ? ? Patient ID: [name], female    DOB: 1941/05/01, 81 y.o.   MRN: [id number] ? ?This visit occurred during the SARS-CoV-2 public health emergency.  Safety protocols were in place, including screening questions prior to the visit, additional usage of staff PPE, and extensive cleaning of exam room while observing appropriate contact time as indicated for disinfecting solutions.   ? ? ?HPI ?[name] is here for follow up of her chronic medical problems, including htn, anxiety, depression, hypothyroid, gerd, hld, insomnia ? ?She is better.  She is taking the celexa.  The first few days she started it she would still hear the song in her hear when humming but that went away. She is sleeping well, has more energy and feels back to her self.   ? ?Left ankle and foot still swollen.  [place] 3/8 neg.  It hurts when swelling.  No pain in foot/ankle or knee.   ? ? ? ?Medications and allergies reviewed with patient and updated if appropriate. ? ?Current Outpatient Medications on File Prior to Visit  ?Medication Sig Dispense Refill  ? aspirin 81 MG tablet Take 81 mg by mouth daily.      ? atorvastatin (LIPITOR) 20 MG tablet TAKE 1 TABLET BY MOUTH EVERY DAY 90 tablet 1  ? Calcium Carbonate (CALTRATE 600 PO) Take 1 tablet by mouth 2 (two) times daily.      ? citalopram (CELEXA) 10 MG tablet Take 1 tablet (10 mg total) by mouth daily. 30 tablet 3  ? Coenzyme Q10 (CO Q-10 PO) Take 50 mg by mouth daily.      ? famotidine (PEPCID) 40 MG tablet Take 1 tablet (40 mg total) by mouth daily. 90 tablet 1  ? levothyroxine (SYNTHROID) 75 MCG tablet TAKE 1 TABLET BY MOUTH EVERY DAY BEFORE BREAKFAST 90 tablet 1  ? losartan (COZAAR) 50 MG tablet Take 1 tablet (50 mg total) by mouth daily. 90 tablet 3  ? meloxicam (MOBIC) 7.5 MG tablet TAKE 1/2-1 TABLETS (3.75-7.5 MG TOTAL) BY MOUTH DAILY. 90 tablet 0  ? metoprolol tartrate (LOPRESSOR) 25 MG tablet TAKE 1 TABLET BY MOUTH TWICE A DAY 180 tablet 1  ? MULTIPLE VITAMIN PO Take 1  tablet by mouth daily.    ? Multiple Vitamins-Minerals (MULTIVITAMIN) LIQD     ? senna-docusate (SENOKOT-S) 8.6-50 MG per tablet Take 1 tablet by mouth daily.      ? VITAMIN D, CHOLECALCIFEROL, PO Take by mouth.    ? zolpidem (AMBIEN) 10 MG tablet Take 0.5 tablets (5 mg total) by mouth at bedtime. 15 tablet 5  ? ?No current facility-administered medications on file prior to visit.  ? ? ? ?Review of Systems  ?Constitutional:  Negative for appetite change (good), chills and fever.  ?Respiratory:  Negative for cough, shortness of breath and wheezing.   ?Cardiovascular:  Positive for leg swelling (LLE). Negative for chest pain and palpitations.  ?Musculoskeletal:  Negative for neck pain.  ?Neurological:  Positive for numbness (occ in right hand when sleeping). Negative for light-headedness and headaches.  ? ?   ?Objective:  ? ?Vitals:  ? 02/01/22 1359  ?BP: 138/78  ?Pulse: (!) 53  ?Temp: 98.2 ?F (36.8 ?C)  ?SpO2: 95%  ? ?BP Readings from Last 3 Encounters:  ?02/01/22 138/78  ?01/18/22 138/82  ?01/01/22 (!) 144/82  ? ?Wt Readings from Last 3 Encounters:  ?02/01/22 128 lb 6.4 oz (58.2 kg)  ?01/18/22 122 lb (55.3 kg)  ?01/01/22 126  lb 3.2 oz (57.2 kg)  ? ?Body mass index is 22.75 kg/m?. ? ?  ?Physical Exam ?Constitutional:   ?   General: She is not in acute distress. ?   Appearance: Normal appearance.  ?HENT:  ?   Head: Normocephalic and atraumatic.  ?Eyes:  ?   Conjunctiva/sclera: Conjunctivae normal.  ?Cardiovascular:  ?   Rate and Rhythm: Normal rate and regular rhythm.  ?   Heart sounds: Normal heart sounds. No murmur heard. ?Pulmonary:  ?   Effort: Pulmonary effort is normal. No respiratory distress.  ?   Breath sounds: Normal breath sounds. No wheezing.  ?Musculoskeletal:  ?   Cervical back: Neck supple.  ?   Right lower leg: No edema.  ?   Left lower leg: No edema.  ?Lymphadenopathy:  ?   Cervical: No cervical adenopathy.  ?Skin: ?   Findings: No rash.  ?Neurological:  ?   Mental Status: She is alert. Mental status  is at baseline.  ?Psychiatric:     ?   Mood and Affect: Mood normal.     ?   Behavior: Behavior normal.  ? ?   ? ?Lab Results  ?Component Value Date  ? WBC 7.5 12/10/2021  ? HGB 14.7 12/10/2021  ? HCT 44.8 12/10/2021  ? PLT 304 12/10/2021  ? GLUCOSE 111 (H) 12/10/2021  ? CHOL 126 08/04/2021  ? TRIG 69.0 08/04/2021  ? HDL 60.10 08/04/2021  ? [place] 52 08/04/2021  ? ALT 20 12/04/2021  ? AST 25 12/04/2021  ? NA 133 (L) 12/10/2021  ? K 3.7 12/10/2021  ? CL 98 12/10/2021  ? CREATININE 0.64 12/10/2021  ? BUN 15 12/10/2021  ? CO2 25 12/10/2021  ? TSH 1.39 12/04/2021  ? INR 1.0 ratio 10/26/2010  ? ? ? ?Assessment & Plan:  ? ? ?Prevnar 20 today ? ?See Problem List for Assessment and Plan of chronic medical problems.  ? ? ?

## 2022-02-01 ENCOUNTER — Other Ambulatory Visit: Payer: Self-pay

## 2022-02-01 ENCOUNTER — Other Ambulatory Visit: Payer: Self-pay | Admitting: Internal Medicine

## 2022-02-01 ENCOUNTER — Ambulatory Visit (INDEPENDENT_AMBULATORY_CARE_PROVIDER_SITE_OTHER): Payer: Medicare Other | Admitting: Internal Medicine

## 2022-02-01 VITALS — BP 138/78 | HR 53 | Temp 98.2°F | Ht 63.0 in | Wt 128.4 lb

## 2022-02-01 DIAGNOSIS — I10 Essential (primary) hypertension: Secondary | ICD-10-CM

## 2022-02-01 DIAGNOSIS — F32A Depression, unspecified: Secondary | ICD-10-CM | POA: Diagnosis not present

## 2022-02-01 DIAGNOSIS — E039 Hypothyroidism, unspecified: Secondary | ICD-10-CM | POA: Diagnosis not present

## 2022-02-01 DIAGNOSIS — E7849 Other hyperlipidemia: Secondary | ICD-10-CM | POA: Diagnosis not present

## 2022-02-01 DIAGNOSIS — K219 Gastro-esophageal reflux disease without esophagitis: Secondary | ICD-10-CM

## 2022-02-01 DIAGNOSIS — F419 Anxiety disorder, unspecified: Secondary | ICD-10-CM

## 2022-02-01 DIAGNOSIS — I25119 Atherosclerotic heart disease of native coronary artery with unspecified angina pectoris: Secondary | ICD-10-CM

## 2022-02-01 DIAGNOSIS — G4709 Other insomnia: Secondary | ICD-10-CM

## 2022-02-01 NOTE — Assessment & Plan Note (Signed)
Chronic Continue atorvastatin 20 mg daily 

## 2022-02-01 NOTE — Assessment & Plan Note (Signed)
Chronic GERD controlled Continue  pepcid 40 mg daily 

## 2022-02-01 NOTE — Assessment & Plan Note (Signed)
Chronic ?BP well controlled ?Continue metoprolol bid, losartan 50 mg daily ?cmp ? ?

## 2022-02-01 NOTE — Assessment & Plan Note (Signed)
Chronic ?improved ?Controlled, stable ?Continue celexa 10 mg daily ? ?

## 2022-02-01 NOTE — Assessment & Plan Note (Signed)
Chronic ?Controlled, stable ?Continue ambien 5 mg nightly ? ?

## 2022-02-01 NOTE — Assessment & Plan Note (Signed)
Chronic ?Continue levothyroxine 75 mcg daily ?

## 2022-02-09 ENCOUNTER — Other Ambulatory Visit: Payer: Self-pay | Admitting: Internal Medicine

## 2022-03-06 DIAGNOSIS — U071 COVID-19: Secondary | ICD-10-CM | POA: Diagnosis not present

## 2022-03-27 ENCOUNTER — Other Ambulatory Visit: Payer: Self-pay | Admitting: Internal Medicine

## 2022-03-30 ENCOUNTER — Ambulatory Visit (INDEPENDENT_AMBULATORY_CARE_PROVIDER_SITE_OTHER): Payer: Medicare Other | Admitting: Internal Medicine

## 2022-03-30 ENCOUNTER — Encounter: Payer: Self-pay | Admitting: Internal Medicine

## 2022-03-30 ENCOUNTER — Telehealth: Payer: Self-pay | Admitting: Internal Medicine

## 2022-03-30 VITALS — BP 130/80 | HR 77 | Temp 97.9°F | Ht 63.0 in | Wt 130.2 lb

## 2022-03-30 DIAGNOSIS — I10 Essential (primary) hypertension: Secondary | ICD-10-CM

## 2022-03-30 DIAGNOSIS — R600 Localized edema: Secondary | ICD-10-CM | POA: Diagnosis not present

## 2022-03-30 DIAGNOSIS — I872 Venous insufficiency (chronic) (peripheral): Secondary | ICD-10-CM

## 2022-03-30 DIAGNOSIS — I25119 Atherosclerotic heart disease of native coronary artery with unspecified angina pectoris: Secondary | ICD-10-CM

## 2022-03-30 MED ORDER — FUROSEMIDE 20 MG PO TABS
10.0000 mg | ORAL_TABLET | Freq: Every day | ORAL | 3 refills | Status: DC | PRN
Start: 2022-03-30 — End: 2022-07-22

## 2022-03-30 NOTE — Progress Notes (Signed)
? ? ?Subjective:  ? ? Patient ID: [name], female    DOB: May 13, 1941, 81 y.o.   MRN: [id number] ? ? ? ? ? ?HPI ?[name] is here for  ?Chief Complaint  ?Patient presents with  ? Foot Swelling  ?  Bilateral swelling foot and leg swelling  ? ? ?Ankle and foot swelling-  she has b/l lower leg swelling -this is not a new problem, but does seem slightly worse now.  The left leg is worse than the right leg, which is normal for her.  The swelling gets worse as the day goes on.  She swelling is best first thing in the morning.   ? ? ?She has a history of left lower extremity swelling in the past.  Amlodipine was decreased, ultrasound was done in the past to rule out DVT which was negative. ? ?She has left knee OA.  It hurts her most when going down stairs.   ? ?Left upper arm aches at times.  She did have recent stress test and echocardiogram and everything looked normal.  Advised to discuss with cardiology since this is what she felt like in the past with angina. ? ?She denies any shortness of breath, but states she is not walking as much.  It does hurt when she walks with the leg swelling. ? ?Medications and allergies reviewed with patient and updated if appropriate. ? ?Current Outpatient Medications on File Prior to Visit  ?Medication Sig Dispense Refill  ? aspirin 81 MG tablet Take 81 mg by mouth daily.      ? atorvastatin (LIPITOR) 20 MG tablet TAKE 1 TABLET BY MOUTH EVERY DAY 90 tablet 1  ? Calcium Carbonate (CALTRATE 600 PO) Take 1 tablet by mouth 2 (two) times daily.      ? citalopram (CELEXA) 10 MG tablet TAKE 1 TABLET BY MOUTH EVERY DAY 90 tablet 2  ? Coenzyme Q10 (CO Q-10 PO) Take 50 mg by mouth daily.      ? famotidine (PEPCID) 40 MG tablet Take 1 tablet (40 mg total) by mouth daily. 90 tablet 1  ? levothyroxine (SYNTHROID) 75 MCG tablet TAKE 1 TABLET BY MOUTH EVERY DAY BEFORE BREAKFAST 90 tablet 1  ? losartan (COZAAR) 50 MG tablet Take 1 tablet (50 mg total) by mouth daily. 90 tablet 3  ? meloxicam (MOBIC)  7.5 MG tablet TAKE 1/2 TO 1 TABLET EVERY DAY 90 tablet 0  ? metoprolol tartrate (LOPRESSOR) 25 MG tablet TAKE 1 TABLET BY MOUTH TWICE A DAY 180 tablet 1  ? MULTIPLE VITAMIN PO Take 1 tablet by mouth daily.    ? Multiple Vitamins-Minerals (MULTIVITAMIN) LIQD     ? senna-docusate (SENOKOT-S) 8.6-50 MG per tablet Take 1 tablet by mouth daily.      ? VITAMIN D, CHOLECALCIFEROL, PO Take by mouth.    ? zolpidem (AMBIEN) 10 MG tablet TAKE 1/2 TABLET BY MOUTH AT BEDTIME. 15 tablet 5  ? ?No current facility-administered medications on file prior to visit.  ? ? ?Review of Systems  ?Constitutional:  Negative for fever.  ?Respiratory:  Negative for cough, shortness of breath and wheezing.   ?Cardiovascular:  Positive for leg swelling. Negative for chest pain and palpitations.  ?Musculoskeletal:  Positive for arthralgias.  ?Skin:  Positive for color change (A little redness in her lower legs at the end of the day-improved first thing in the morning when the swelling is better).  ? ?   ?Objective:  ? ?Vitals:  ? 03/30/22 1448  ?  BP: 130/80  ?Pulse: 77  ?Temp: 97.9 ?F (36.6 ?C)  ?SpO2: 97%  ? ?BP Readings from Last 3 Encounters:  ?03/30/22 130/80  ?02/01/22 138/78  ?01/18/22 138/82  ? ?Wt Readings from Last 3 Encounters:  ?03/30/22 130 lb 3.2 oz (59.1 kg)  ?02/01/22 128 lb 6.4 oz (58.2 kg)  ?01/18/22 122 lb (55.3 kg)  ? ?Body mass index is 23.06 kg/m?. ? ?  ?Physical Exam ?Constitutional:   ?   General: She is not in acute distress. ?   Appearance: Normal appearance.  ?HENT:  ?   Head: Normocephalic and atraumatic.  ?Eyes:  ?   Conjunctiva/sclera: Conjunctivae normal.  ?Cardiovascular:  ?   Rate and Rhythm: Normal rate and regular rhythm.  ?   Heart sounds: Murmur (3/6 sys) heard.  ?Pulmonary:  ?   Effort: Pulmonary effort is normal. No respiratory distress.  ?   Breath sounds: Normal breath sounds. No wheezing.  ?Musculoskeletal:  ?   Cervical back: Neck supple.  ?   Right lower leg: Edema (trace - ankle) present.  ?   Left lower  leg: Edema (mild) present.  ?Lymphadenopathy:  ?   Cervical: No cervical adenopathy.  ?Skin: ?   General: Skin is warm and dry.  ?   Findings: No rash.  ?Neurological:  ?   Mental Status: She is alert. Mental status is at baseline.  ?Psychiatric:     ?   Mood and Affect: Mood normal.     ?   Behavior: Behavior normal.  ? ?   ? ? ? ? ? ?Assessment & Plan:  ? ? ?See Problem List for Assessment and Plan of chronic medical problems.  ? ? ? ? ?

## 2022-03-30 NOTE — Patient Instructions (Addendum)
? ? ?  For the leg swelling -  ?Limit the salt in your diet ?Elevate your legs when sitting ?Try wearing your compression hose ?Walk regularly ? ? ? ?Medications changes include :   lasix 10-20 mg daily as needed for leg swelling ? ? ?Your prescription(s) have been sent to your pharmacy.  ? ? ? ?Return for follow up as scheduled. ? ?

## 2022-03-30 NOTE — Telephone Encounter (Signed)
Pt called in and states that she has been having trouble with L leg- and is a heart pt. Foot and ankle swelling.  ? ?Other leg and ankle are now swelling, burning, turning red, and hurting.  ? ?Requesting a call back to discuss what to do? Does she need to schedule an office visit? ?

## 2022-03-30 NOTE — Telephone Encounter (Signed)
Spoke with patient and appointment made

## 2022-03-30 NOTE — Assessment & Plan Note (Signed)
Chronic ?Blood pressure well controlled ?Continue metoprolol 25 mg twice daily, losartan 50 mg daily ?

## 2022-03-30 NOTE — Assessment & Plan Note (Signed)
Chronic edema in bilateral lower legs-LLE > RLE ?Has been on Lasix in the past at a low dose and she states it did not work, but is willing to retry it-discussed possible side effects ?Lasix 10-40 mg daily as needed only ?Stressed elevation of legs, low-sodium diet ?Encouraged regular walking ?Encouraged her to try the compression hose ?

## 2022-03-30 NOTE — Assessment & Plan Note (Signed)
Chronic edema in bilateral lower legs-LLE > RLE ?Primarily related to chronic venous insufficiency ?She does have some mild diastolic dysfunction and valve disease, but denies shortness of breath although she is less apt than she has been in the past so I do not think that her heart is contributing to her leg swelling at this time ?Has been on Lasix in the past at a low dose and she states it did not work, but is willing to retry it-discussed possible side effects ?Lasix 10-40 mg daily as needed only ?Stressed elevation of legs, low-sodium diet ?Encouraged regular walking ?Encouraged her to try the compression hose ?

## 2022-04-09 NOTE — Telephone Encounter (Signed)
NOTES NOT NEEDED

## 2022-04-22 ENCOUNTER — Encounter: Payer: Self-pay | Admitting: Internal Medicine

## 2022-04-26 ENCOUNTER — Ambulatory Visit (INDEPENDENT_AMBULATORY_CARE_PROVIDER_SITE_OTHER): Payer: Medicare Other | Admitting: Obstetrics & Gynecology

## 2022-04-26 ENCOUNTER — Encounter: Payer: Self-pay | Admitting: Obstetrics & Gynecology

## 2022-04-26 VITALS — BP 162/82 | HR 80 | Resp 12 | Ht 63.5 in | Wt 131.0 lb

## 2022-04-26 DIAGNOSIS — R102 Pelvic and perineal pain: Secondary | ICD-10-CM | POA: Diagnosis not present

## 2022-04-26 DIAGNOSIS — N6310 Unspecified lump in the right breast, unspecified quadrant: Secondary | ICD-10-CM

## 2022-04-26 DIAGNOSIS — Z853 Personal history of malignant neoplasm of breast: Secondary | ICD-10-CM | POA: Diagnosis not present

## 2022-04-26 DIAGNOSIS — N6320 Unspecified lump in the left breast, unspecified quadrant: Secondary | ICD-10-CM | POA: Diagnosis not present

## 2022-04-26 NOTE — Progress Notes (Signed)
[name] 1941-02-04 [id number]        81 y.o.  G2P2L2  RP: Left breast lump/tender area on vulva  HPI: C/O a left breast lump, non tender, noticed recently. H/O Left Breast Ca with positive LNs in 1992. S/P Bilateral Mastectomy with reconstruction.  Small nodule on the left chest wall, excised by Dr Dalbert Batman 04/2017, patho benign, fat necrosis with fibrosis.  S/P Total Hysterectomy.  No pelvic pain.  Normal vaginal secretions.  Not sexually active. C/O an area of tenderness at the Rt vulva.   OB History  Gravida Para Term Preterm AB Living  '2 2       2  '$ SAB IAB Ectopic Multiple Live Births               # Outcome Date GA Lbr Len/2nd Weight Sex Delivery Anes PTL Lv  2 Para           1 Para             Past medical history,surgical history, problem list, medications, allergies, family history and social history were all reviewed and documented in the EPIC chart.   Directed ROS with pertinent positives and negatives documented in the history of present illness/assessment and plan.  Exam:  Vitals:   04/26/22 1619  BP: (!) 162/82  Pulse: 80  Resp: 12  Weight: 131 lb (59.4 kg)  Height: 5' 3.5" (1.613 m)   General appearance:  Normal  Breasts:  S/P bilateral mastectomies with reconstruction.  Left breast hard nodule at the center of the breast, about 1 cm, solid, not attached to the skin.  Skin normal.  Left axilla Neg.                                                                                             Right breast hard nodule at the center of the breast, about 1 cm, solid, not attached to the skin.  Skin normal.  Right axilla Neg.  Gynecologic exam: Vulva:  Atrophy of menopause.  No lesion seen.  Non-tender at this time.   Assessment/Plan:  81 y.o. G2P2L2   1. Bilateral breast lump C/O a left breast lump, non tender, noticed recently. H/O Left Breast Ca with positive LNs in 1992. S/P Bilateral Mastectomy with reconstruction.  Small nodule on the left chest wall,  excised by Dr Dalbert Batman 04/2017, patho benign, fat necrosis with fibrosis. On breast exam:  S/P bilateral mastectomies with reconstruction.  Left breast hard nodule at the center of the breast, about 1 cm, solid, not attached to the skin.  Skin normal.  Left axilla Neg.  Right breast hard nodule at the center of the breast, about 1 cm, solid, not attached to the skin.  Skin normal.  Right axilla Neg.    Probably bilateral nodules corresponding with scar tissue.  Patient reassured, but decision to further investigate with a Bilateral Breast US.   2. History of left breast cancer S/P Bilateral Mastectomy and Reconstruction.  3. Vulvar pain  Vulva normal on exam today.  Patient reassured.  Counseling and management, review of documentation on Breast lumps  and vulvar tenderness for 25 minutes.  [name] MD, 4:26 PM 04/26/2022

## 2022-04-27 ENCOUNTER — Encounter: Payer: Self-pay | Admitting: Obstetrics & Gynecology

## 2022-04-27 ENCOUNTER — Telehealth: Payer: Self-pay

## 2022-04-27 ENCOUNTER — Other Ambulatory Visit: Payer: Self-pay

## 2022-04-27 DIAGNOSIS — Z9013 Acquired absence of bilateral breasts and nipples: Secondary | ICD-10-CM

## 2022-04-27 DIAGNOSIS — H5203 Hypermetropia, bilateral: Secondary | ICD-10-CM | POA: Diagnosis not present

## 2022-04-27 DIAGNOSIS — N6320 Unspecified lump in the left breast, unspecified quadrant: Secondary | ICD-10-CM

## 2022-04-27 DIAGNOSIS — Z961 Presence of intraocular lens: Secondary | ICD-10-CM | POA: Diagnosis not present

## 2022-04-27 NOTE — Telephone Encounter (Signed)
Left message for patient to call. The only breast imaging I see in chart was  breast u/s in 2020 at [facility]. Just wanted to check and see if that is where she would like to schedule and where are previous films at?

## 2022-04-27 NOTE — Telephone Encounter (Signed)
Spoke with patient and confirmed last breast imaging 2020 breast u/s at [facility].  She would like me to send order and ask [facility] to call her to schedule the appt. Order was faxed with request for them to call patient to schedule. I did tell her that if she does not hear from [facility] in a day or two to call us so we can follow up.

## 2022-04-27 NOTE — Telephone Encounter (Signed)
Angelica Bruins, MD  P [facility] Triage H/O Left Breast Cancer s/p Bilateral Mastectomy with reconstruction.  Patient felt a lump on the left breast.  On Breast exam, bilateral solid, breast nodules not attached to the skin, about 1 cm at the center of the breasts.  Likely scar tissue.  Schedule Bilateral Breast US.

## 2022-05-12 DIAGNOSIS — N6320 Unspecified lump in the left breast, unspecified quadrant: Secondary | ICD-10-CM | POA: Diagnosis not present

## 2022-05-12 DIAGNOSIS — N6310 Unspecified lump in the right breast, unspecified quadrant: Secondary | ICD-10-CM | POA: Diagnosis not present

## 2022-05-12 NOTE — Telephone Encounter (Signed)
I checked with [facility] to follow up.  Patient is scheduled today at 1:30pm for the breast u/s.

## 2022-05-14 ENCOUNTER — Encounter: Payer: Self-pay | Admitting: Obstetrics & Gynecology

## 2022-06-03 ENCOUNTER — Other Ambulatory Visit: Payer: Self-pay | Admitting: Internal Medicine

## 2022-07-14 ENCOUNTER — Ambulatory Visit: Payer: Medicare Other | Attending: Cardiovascular Disease | Admitting: Cardiovascular Disease

## 2022-07-14 ENCOUNTER — Encounter: Payer: Self-pay | Admitting: Cardiovascular Disease

## 2022-07-14 ENCOUNTER — Ambulatory Visit
Admission: RE | Admit: 2022-07-14 | Discharge: 2022-07-14 | Disposition: A | Discharge status: Home / Self Care | Payer: Medicare Other | Source: Ambulatory Visit | Attending: Cardiovascular Disease | Admitting: Cardiovascular Disease

## 2022-07-14 VITALS — BP 132/80 | HR 52 | Ht 63.0 in | Wt 132.0 lb

## 2022-07-14 DIAGNOSIS — I350 Nonrheumatic aortic (valve) stenosis: Secondary | ICD-10-CM | POA: Insufficient documentation

## 2022-07-14 DIAGNOSIS — I5032 Chronic diastolic (congestive) heart failure: Secondary | ICD-10-CM | POA: Diagnosis not present

## 2022-07-14 DIAGNOSIS — E782 Mixed hyperlipidemia: Secondary | ICD-10-CM | POA: Diagnosis not present

## 2022-07-14 DIAGNOSIS — J449 Chronic obstructive pulmonary disease, unspecified: Secondary | ICD-10-CM | POA: Diagnosis not present

## 2022-07-14 DIAGNOSIS — I25119 Atherosclerotic heart disease of native coronary artery with unspecified angina pectoris: Secondary | ICD-10-CM | POA: Diagnosis not present

## 2022-07-14 DIAGNOSIS — I10 Essential (primary) hypertension: Secondary | ICD-10-CM

## 2022-07-14 DIAGNOSIS — R0602 Shortness of breath: Secondary | ICD-10-CM | POA: Diagnosis not present

## 2022-07-14 NOTE — Patient Instructions (Addendum)
Medication Instructions:  Your physician recommends that you continue on your current medications as directed. Please refer to the Current Medication list given to you today.  *If you need a refill on your cardiac medications before your next appointment, please call your pharmacy*   Lab Work: CBC, BMET, BNP today If you have labs (blood work) drawn today and your tests are completely normal, you will receive your results only by: [place] (if you have MyChart) OR A paper copy in the mail If you have any lab test that is abnormal or we need to change your treatment, we will call you to review the results.  Testing/Procedures: ECHO Your physician has requested that you have an echocardiogram. Echocardiography is a painless test that uses sound waves to create images of your heart. It provides your doctor with information about the size and shape of your heart and how well your heart's chambers and valves are working. This procedure takes approximately one hour. There are no restrictions for this procedure.  Chest X-ray A chest x-ray takes a picture of the organs and structures inside the chest, including the heart, lungs, and blood vessels. This test can show several things, including, whether the heart is enlarges; whether fluid is building up in the lungs; and whether pacemaker / defibrillator leads are still in place.  Follow-Up: At [facility], you and your health needs are our priority.  As part of our continuing mission to provide you with exceptional heart care, we have created designated Provider Care Teams.  These Care Teams include your primary Cardiologist (physician) and Advanced Practice Providers (APPs -  Physician Assistants and Nurse Practitioners) who all work together to provide you with the care you need, when you need it.  We recommend signing up for the patient portal called "MyChart".  Sign up information is provided on this After Visit Summary.  MyChart  is used to connect with patients for Virtual Visits (Telemedicine).  Patients are able to view lab/test results, encounter notes, upcoming appointments, etc.  Non-urgent messages can be sent to your provider as well.   To learn more about what you can do with MyChart, go to [email or web address].    Your next appointment:   6 month(s)  The format for your next appointment:   In Person  Provider:   [name], MD      Other Instructions **Blood pressure goal is less than 140/90 (<140/90)**  Important Information About Sugar
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
## 2022-07-15 LAB — CBC
Hematocrit: 37.2 % (ref 34.0–46.6)
Hemoglobin: 12.6 g/dL (ref 11.1–15.9)
MCH: 29.9 pg (ref 26.6–33.0)
MCHC: 33.9 g/dL (ref 31.5–35.7)
MCV: 88 fL (ref 79–97)
Platelets: 280 10*3/uL (ref 150–450)
RBC: 4.21 x10E6/uL (ref 3.77–5.28)
RDW: 13.7 % (ref 11.7–15.4)
WBC: 7.3 10*3/uL (ref 3.4–10.8)

## 2022-07-15 LAB — BASIC METABOLIC PANEL
BUN/Creatinine Ratio: 19 (ref 12–28)
BUN: 13 mg/dL (ref 8–27)
CO2: 22 mmol/L (ref 20–29)
Calcium: 9.8 mg/dL (ref 8.7–10.3)
Chloride: 101 mmol/L (ref 96–106)
Creatinine, Ser: 0.69 mg/dL (ref 0.57–1.00)
Glucose: 80 mg/dL (ref 70–99)
Potassium: 4.6 mmol/L (ref 3.5–5.2)
Sodium: 139 mmol/L (ref 134–144)
eGFR: 86 mL/min/{1.73_m2} (ref >59)

## 2022-07-15 LAB — PRO B NATRIURETIC PEPTIDE: NT-Pro BNP: 1612 pg/mL — ABNORMAL HIGH (ref 0–738)

## 2022-07-22 ENCOUNTER — Telehealth: Payer: Self-pay

## 2022-07-22 MED ORDER — EMPAGLIFLOZIN 10 MG PO TABS
10.0000 mg | ORAL_TABLET | Freq: Every day | ORAL | 0 refills | Status: DC
Start: 2022-07-22 — End: 2022-07-29

## 2022-07-22 MED ORDER — FUROSEMIDE 20 MG PO TABS: 20.0000 mg | ORAL_TABLET | Freq: Every day | ORAL | 3 refills | Status: DC

## 2022-07-22 NOTE — Telephone Encounter (Signed)
Called and spoke with patient to review recommendations. She verbalized understanding to take Furosemide '20mg'$  daily and will check with her pharmacy on whether jardiance is covered. Pt has been scheduled for f/u OV on 08/23/22.

## 2022-07-22 NOTE — Telephone Encounter (Signed)
-----   Message from [name], MD sent at 07/20/2022  6:13 AM EDT ----- BNP elevated. Pt with progressive dyspnea. CXR clear. Would change lasix from prn to 20 mg daily. Add Jardiance 10 mg daily if covered by her insurance. Arrange follow-up with me or APP 4-6 weeks to see her response. thx

## 2022-07-23 ENCOUNTER — Ambulatory Visit (HOSPITAL_COMMUNITY): Payer: Medicare Other | Attending: Cardiovascular Disease

## 2022-07-23 DIAGNOSIS — I350 Nonrheumatic aortic (valve) stenosis: Secondary | ICD-10-CM

## 2022-07-23 DIAGNOSIS — I5032 Chronic diastolic (congestive) heart failure: Secondary | ICD-10-CM | POA: Diagnosis not present

## 2022-07-23 DIAGNOSIS — E782 Mixed hyperlipidemia: Secondary | ICD-10-CM | POA: Diagnosis not present

## 2022-07-23 DIAGNOSIS — I10 Essential (primary) hypertension: Secondary | ICD-10-CM | POA: Diagnosis not present

## 2022-07-23 DIAGNOSIS — I25119 Atherosclerotic heart disease of native coronary artery with unspecified angina pectoris: Secondary | ICD-10-CM

## 2022-07-23 LAB — ECHOCARDIOGRAM COMPLETE
AR max vel: 1.04 cm2
AV Area VTI: 1.18 cm2
AV Area mean vel: 1.07 cm2
AV Mean grad: 19 mmHg
AV Peak grad: 34.3 mmHg
Ao pk vel: 2.93 m/s
Area-P 1/2: 3.13 cm2
S' Lateral: 2.1 cm

## 2022-07-28 ENCOUNTER — Telehealth: Payer: Self-pay | Admitting: Cardiovascular Disease

## 2022-07-28 NOTE — Telephone Encounter (Signed)
Patient returned RN's call and stated the best time to reach her would be tomorrow (9/14) morning.

## 2022-07-29 ENCOUNTER — Telehealth: Payer: Self-pay

## 2022-07-29 NOTE — Telephone Encounter (Signed)
Documented in another call already.

## 2022-07-29 NOTE — Telephone Encounter (Signed)
Pt returning nurses call regarding results. Please advise 

## 2022-07-29 NOTE — Telephone Encounter (Signed)
Called and spoke with patient. She is doing the furosemide '20mg'$  daily and will continue. We had already sent Jardiance into her pharmacy for her to try, however, she states that her copay was $89/month and that this is just too expensive for her. She is willing to take it if she can get help paying for it. I informed her that ECHO shows her pumping function of her heart is still good/normal and that ultimately the choice is up to her on taking it. She states she cannot at this time. Also informed her that if in the future, the medication needs to be taken, rather than just recommended, we can help her try to get patient assistance through the drug company to help her pay for it. She appreciates call. MAR updated at this time.

## 2022-07-29 NOTE — Telephone Encounter (Signed)
-----   Message from [name], MD sent at 07/27/2022  2:34 PM EDT ----- Reviewed echo demonstrating stable aortic stenosis in the mild to moderate range, normal LV systolic function, and pseudonormalization of diastolic dysfunction.  Recommend take furosemide 20 mg daily rather than as needed.  Recommend trial of Jardiance 10 mg daily.

## 2022-08-03 ENCOUNTER — Encounter: Payer: Self-pay | Admitting: Internal Medicine

## 2022-08-03 NOTE — Progress Notes (Unsigned)
Subjective:    Patient ID: [name], female    DOB: 01/23/1941, 81 y.o.   MRN: [id number]     HPI [name] is here for follow up of her chronic medical problems, including htn, hypothyroid, gerd, hld, anxiety, depression, insomnia, osteoporosis  She is taking all of her medications as prescribed.    She is not exercising.  She is lazy.  She is having knee pain - plans on seeing Dr Wynelle Link   Medications and allergies reviewed with patient and updated if appropriate.  Current Outpatient Medications on File Prior to Visit  Medication Sig Dispense Refill   aspirin 81 MG tablet Take 81 mg by mouth daily.       atorvastatin (LIPITOR) 20 MG tablet TAKE 1 TABLET BY MOUTH EVERY DAY 90 tablet 1   Calcium Carbonate (CALTRATE 600 PO) Take 1 tablet by mouth 2 (two) times daily.       citalopram (CELEXA) 10 MG tablet TAKE 1 TABLET BY MOUTH EVERY DAY 90 tablet 2   Coenzyme Q10 (CO Q-10 PO) Take 50 mg by mouth daily.       famotidine (PEPCID) 40 MG tablet Take 1 tablet (40 mg total) by mouth daily. 90 tablet 1   fluticasone (FLONASE) 50 MCG/ACT nasal spray START ONE WEEK AFTER SURGERY Spray 1 spray(s) EVERY DAY by intranasal route for one month     furosemide (LASIX) 20 MG tablet Take 1 tablet (20 mg total) by mouth daily. 90 tablet 3   levothyroxine (SYNTHROID) 75 MCG tablet TAKE 1 TABLET BY MOUTH EVERY DAY BEFORE BREAKFAST 90 tablet 1   losartan (COZAAR) 50 MG tablet Take 1 tablet (50 mg total) by mouth daily. 90 tablet 3   meloxicam (MOBIC) 7.5 MG tablet TAKE 1/2 TO 1 TABLET EVERY DAY 90 tablet 0   metoprolol tartrate (LOPRESSOR) 25 MG tablet TAKE 1 TABLET BY MOUTH TWICE A DAY 180 tablet 1   MULTIPLE VITAMIN PO Take 1 tablet by mouth daily.     Multiple Vitamins-Minerals (MULTIVITAMIN) LIQD      senna-docusate (SENOKOT-S) 8.6-50 MG per tablet Take 1 tablet by mouth daily.       VITAMIN D, CHOLECALCIFEROL, PO Take by mouth.     No current facility-administered medications on file  prior to visit.     Review of Systems  Constitutional:  Negative for fever.  Respiratory:  Negative for cough, shortness of breath and wheezing.   Cardiovascular:  Positive for leg swelling. Negative for chest pain and palpitations.  Neurological:  Negative for light-headedness and headaches.       Objective:   Vitals:   08/04/22 1407  BP: 128/82  Pulse: 80  Temp: 97.6 F (36.4 C)  SpO2: 97%   BP Readings from Last 3 Encounters:  08/04/22 128/82  07/14/22 132/80  04/26/22 (!) 162/82   Wt Readings from Last 3 Encounters:  08/04/22 130 lb (59 kg)  07/14/22 132 lb (59.9 kg)  04/26/22 131 lb (59.4 kg)   Body mass index is 23.03 kg/m.    Physical Exam Constitutional:      General: She is not in acute distress.    Appearance: Normal appearance.  HENT:     Head: Normocephalic and atraumatic.  Eyes:     Conjunctiva/sclera: Conjunctivae normal.  Cardiovascular:     Rate and Rhythm: Normal rate and regular rhythm.     Heart sounds: Murmur (3/6 sys) heard.  Pulmonary:     Effort: Pulmonary effort  is normal. No respiratory distress.     Breath sounds: Normal breath sounds. No wheezing.  Musculoskeletal:     Cervical back: Neck supple.     Right lower leg: No edema.     Left lower leg: Edema (mild ankel edema) present.  Lymphadenopathy:     Cervical: No cervical adenopathy.  Skin:    General: Skin is warm and dry.     Findings: No rash.  Neurological:     Mental Status: She is alert. Mental status is at baseline.  Psychiatric:        Mood and Affect: Mood normal.        Behavior: Behavior normal.        Lab Results  Component Value Date   WBC 7.3 07/14/2022   HGB 12.6 07/14/2022   HCT 37.2 07/14/2022   PLT 280 07/14/2022   GLUCOSE 80 07/14/2022   CHOL 126 08/04/2021   TRIG 69.0 08/04/2021   HDL 60.10 08/04/2021   LDLCALC 52 08/04/2021   ALT 20 12/04/2021   AST 25 12/04/2021   NA 139 07/14/2022   K 4.6 07/14/2022   CL 101 07/14/2022   CREATININE  0.69 07/14/2022   BUN 13 07/14/2022   CO2 22 07/14/2022   TSH 1.39 12/04/2021   INR 1.0 ratio 10/26/2010     Assessment & Plan:    See Problem List for Assessment and Plan of chronic medical problems.

## 2022-08-03 NOTE — Patient Instructions (Addendum)
    Pneumonia, flu vaccine today    Blood work was ordered.     Medications changes include :   start taking famotidine just as needed   Your prescription(s) have been sent to your pharmacy.     Return in about 6 months (around 02/02/2023) for follow up.

## 2022-08-04 ENCOUNTER — Ambulatory Visit (INDEPENDENT_AMBULATORY_CARE_PROVIDER_SITE_OTHER): Payer: Medicare Other | Admitting: Internal Medicine

## 2022-08-04 VITALS — BP 128/82 | HR 80 | Temp 97.6°F | Ht 63.0 in | Wt 130.0 lb

## 2022-08-04 DIAGNOSIS — G4709 Other insomnia: Secondary | ICD-10-CM | POA: Diagnosis not present

## 2022-08-04 DIAGNOSIS — I10 Essential (primary) hypertension: Secondary | ICD-10-CM | POA: Diagnosis not present

## 2022-08-04 DIAGNOSIS — F32A Depression, unspecified: Secondary | ICD-10-CM

## 2022-08-04 DIAGNOSIS — I25119 Atherosclerotic heart disease of native coronary artery with unspecified angina pectoris: Secondary | ICD-10-CM

## 2022-08-04 DIAGNOSIS — K219 Gastro-esophageal reflux disease without esophagitis: Secondary | ICD-10-CM | POA: Diagnosis not present

## 2022-08-04 DIAGNOSIS — M159 Polyosteoarthritis, unspecified: Secondary | ICD-10-CM

## 2022-08-04 DIAGNOSIS — R739 Hyperglycemia, unspecified: Secondary | ICD-10-CM | POA: Diagnosis not present

## 2022-08-04 DIAGNOSIS — I5032 Chronic diastolic (congestive) heart failure: Secondary | ICD-10-CM | POA: Diagnosis not present

## 2022-08-04 DIAGNOSIS — E039 Hypothyroidism, unspecified: Secondary | ICD-10-CM | POA: Diagnosis not present

## 2022-08-04 DIAGNOSIS — E7849 Other hyperlipidemia: Secondary | ICD-10-CM

## 2022-08-04 DIAGNOSIS — F419 Anxiety disorder, unspecified: Secondary | ICD-10-CM | POA: Diagnosis not present

## 2022-08-04 DIAGNOSIS — M150 Primary generalized (osteo)arthritis: Secondary | ICD-10-CM

## 2022-08-04 DIAGNOSIS — Z23 Encounter for immunization: Secondary | ICD-10-CM

## 2022-08-04 DIAGNOSIS — R7303 Prediabetes: Secondary | ICD-10-CM | POA: Insufficient documentation

## 2022-08-04 LAB — COMPREHENSIVE METABOLIC PANEL
ALT: 18 U/L (ref 0–35)
AST: 27 U/L (ref 0–37)
Albumin: 4 g/dL (ref 3.5–5.2)
Alkaline Phosphatase: 69 U/L (ref 39–117)
BUN: 18 mg/dL (ref 6–23)
CO2: 30 mEq/L (ref 19–32)
Calcium: 9.3 mg/dL (ref 8.4–10.5)
Chloride: 100 mEq/L (ref 96–112)
Creatinine, Ser: 0.72 mg/dL (ref 0.40–1.20)
GFR: 77.25 mL/min (ref >60.00)
Glucose, Bld: 87 mg/dL (ref 70–99)
Potassium: 4.3 mEq/L (ref 3.5–5.1)
Sodium: 135 mEq/L (ref 135–145)
Total Bilirubin: 1.1 mg/dL (ref 0.2–1.2)
Total Protein: 6.8 g/dL (ref 6.0–8.3)

## 2022-08-04 LAB — LIPID PANEL
Cholesterol: 131 mg/dL (ref 0–200)
HDL: 53.6 mg/dL (ref >39.00)
LDL Cholesterol: 61 mg/dL (ref 0–99)
NonHDL: 77.29
Total CHOL/HDL Ratio: 2
Triglycerides: 79 mg/dL (ref 0.0–149.0)
VLDL: 15.8 mg/dL (ref 0.0–40.0)

## 2022-08-04 LAB — CBC WITH DIFFERENTIAL/PLATELET
Basophils Absolute: 0 10*3/uL (ref 0.0–0.1)
Basophils Relative: 0.6 % (ref 0.0–3.0)
Eosinophils Absolute: 0.3 10*3/uL (ref 0.0–0.7)
Eosinophils Relative: 5.2 % — ABNORMAL HIGH (ref 0.0–5.0)
HCT: 37.7 % (ref 36.0–46.0)
Hemoglobin: 12.6 g/dL (ref 12.0–15.0)
Lymphocytes Relative: 34.1 % (ref 12.0–46.0)
Lymphs Abs: 2 10*3/uL (ref 0.7–4.0)
MCHC: 33.4 g/dL (ref 30.0–36.0)
MCV: 89.1 fl (ref 78.0–100.0)
Monocytes Absolute: 0.5 10*3/uL (ref 0.1–1.0)
Monocytes Relative: 8.9 % (ref 3.0–12.0)
Neutro Abs: 3 10*3/uL (ref 1.4–7.7)
Neutrophils Relative %: 51.2 % (ref 43.0–77.0)
Platelets: 238 10*3/uL (ref 150.0–400.0)
RBC: 4.22 Mil/uL (ref 3.87–5.11)
RDW: 14.7 % (ref 11.5–15.5)
WBC: 5.8 10*3/uL (ref 4.0–10.5)

## 2022-08-04 LAB — TSH: TSH: 1.47 u[IU]/mL (ref 0.35–5.50)

## 2022-08-04 LAB — BRAIN NATRIURETIC PEPTIDE: Pro B Natriuretic peptide (BNP): 232 pg/mL — ABNORMAL HIGH (ref 0.0–100.0)

## 2022-08-04 MED ORDER — ZOLPIDEM TARTRATE 10 MG PO TABS: 5.0000 mg | ORAL_TABLET | Freq: Every day | ORAL | 5 refills | Status: DC

## 2022-08-04 NOTE — Assessment & Plan Note (Signed)
Chronic °Regular exercise and healthy diet encouraged °Check lipid panel  °Continue atorvastatin 20 mg daily °

## 2022-08-04 NOTE — Assessment & Plan Note (Addendum)
Chronic Monitor by Dr Burt Knack - Grade 2 DD Taking lasix 20 mg daily Was not able to afford jardiance Appears euvolemic Will check BNP as comparison to when she was not taking the lasix dialy

## 2022-08-04 NOTE — Assessment & Plan Note (Signed)
Chronic  Clinically euthyroid Check tsh and will titrate med dose if needed Currently taking levothyroxine 75 mcg daily  

## 2022-08-04 NOTE — Assessment & Plan Note (Signed)
Chronic Controlled, Stable Continue Ambien 5 mg daily which she has been on for years and is unable to sleep without it

## 2022-08-04 NOTE — Assessment & Plan Note (Signed)
Chronic Controlled, Stable Continue citalopram 10 mg daily

## 2022-08-04 NOTE — Assessment & Plan Note (Signed)
Chronic Blood pressure well controlled CMP Continue losartan 50 mg daily, metoprolol 25 mg twice daily

## 2022-08-04 NOTE — Assessment & Plan Note (Addendum)
Chronic GERD controlled - thinks recent flare was stress related and would like to try to go without it Stop famotidine 40 mg daily and take as needed

## 2022-08-04 NOTE — Assessment & Plan Note (Signed)
Chronic Check a1c Low sugar / carb diet Stressed regular exercise  

## 2022-08-04 NOTE — Assessment & Plan Note (Signed)
Chronic Multiple joints Continue meloxicam 7.5 mg-taking half a pill daily as needed We will monitor kidney function and reflux

## 2022-08-05 LAB — HEMOGLOBIN A1C: Hgb A1c MFr Bld: 5 % (ref 4.6–6.5)

## 2022-08-10 ENCOUNTER — Other Ambulatory Visit: Payer: Self-pay

## 2022-08-10 ENCOUNTER — Telehealth: Payer: Self-pay | Admitting: Internal Medicine

## 2022-08-10 MED ORDER — METOPROLOL TARTRATE 25 MG PO TABS: 25.0000 mg | ORAL_TABLET | Freq: Two times a day (BID) | ORAL | 1 refills | Status: DC

## 2022-08-10 NOTE — Telephone Encounter (Signed)
Sent in today 

## 2022-08-10 NOTE — Telephone Encounter (Signed)
Patient needs refills metoprolol - please send [facility]

## 2022-08-18 DIAGNOSIS — D2271 Melanocytic nevi of right lower limb, including hip: Secondary | ICD-10-CM | POA: Diagnosis not present

## 2022-08-18 DIAGNOSIS — L738 Other specified follicular disorders: Secondary | ICD-10-CM | POA: Diagnosis not present

## 2022-08-18 DIAGNOSIS — D225 Melanocytic nevi of trunk: Secondary | ICD-10-CM | POA: Diagnosis not present

## 2022-08-18 DIAGNOSIS — D1801 Hemangioma of skin and subcutaneous tissue: Secondary | ICD-10-CM | POA: Diagnosis not present

## 2022-08-18 DIAGNOSIS — D2272 Melanocytic nevi of left lower limb, including hip: Secondary | ICD-10-CM | POA: Diagnosis not present

## 2022-08-18 DIAGNOSIS — L821 Other seborrheic keratosis: Secondary | ICD-10-CM | POA: Diagnosis not present

## 2022-08-18 DIAGNOSIS — Z85828 Personal history of other malignant neoplasm of skin: Secondary | ICD-10-CM | POA: Diagnosis not present

## 2022-08-23 ENCOUNTER — Encounter: Payer: Self-pay | Admitting: Cardiovascular Disease

## 2022-08-23 ENCOUNTER — Ambulatory Visit: Payer: Medicare Other | Attending: Cardiovascular Disease | Admitting: Cardiovascular Disease

## 2022-08-23 ENCOUNTER — Telehealth: Payer: Self-pay

## 2022-08-23 VITALS — BP 130/80 | HR 55 | Ht 63.0 in | Wt 130.4 lb

## 2022-08-23 DIAGNOSIS — I5032 Chronic diastolic (congestive) heart failure: Secondary | ICD-10-CM

## 2022-08-23 DIAGNOSIS — E782 Mixed hyperlipidemia: Secondary | ICD-10-CM

## 2022-08-23 DIAGNOSIS — I10 Essential (primary) hypertension: Secondary | ICD-10-CM | POA: Diagnosis not present

## 2022-08-23 DIAGNOSIS — I25119 Atherosclerotic heart disease of native coronary artery with unspecified angina pectoris: Secondary | ICD-10-CM | POA: Diagnosis not present

## 2022-08-23 DIAGNOSIS — I350 Nonrheumatic aortic (valve) stenosis: Secondary | ICD-10-CM | POA: Diagnosis not present

## 2022-08-23 MED ORDER — EMPAGLIFLOZIN 10 MG PO TABS: 10.0000 mg | ORAL_TABLET | Freq: Every day | ORAL | 11 refills | Status: DC

## 2022-08-23 NOTE — Progress Notes (Signed)
Cardiology Office Note:    Date:  08/23/2022   ID:  [name] 14-Mar-1941, MRN [id number]  PCP:  [name], MD   [place] Prov[facility] Cardiologist:  [name], MD     Referring MD: [name], MD   Chief Complaint  Patient presents with   Shortness of Breath   Coronary Artery Disease    History of Present Illness:    [name] is a 81 y.o. female with a hx of coronary artery disease and aortic stenosis, presenting for follow-up evaluation.  The patient has multivessel CAD and was treated with CABG in 2007.  Cardiac catheterization in 2011 demonstrated patency of all of her bypass grafts.  She has developed moderate aortic stenosis and has been followed with serial echo studies.  When the patient was last seen in August, she complained of worsening dyspnea and fatigue.  proBNP was significantly elevated.  Lasix was increased to 20 mg daily.  She was started on Jardiance but she could not afford it at a monthly cost of $90.  She continues to have fatigue.  Breathing is a little bit better, but still short of breath with any moderate level activity such as taking her trash out or walking at a brisk pace.  No orthopnea, PND, or leg swelling.  No chest pain or pressure.  No heart palpitations.  Past Medical History:  Diagnosis Date   ALLERGIC RHINITIS    Allergy    SEASONAL   Anxiety    Aortic stenosis, moderate    Blood transfusion without reported diagnosis    BREAST CANCER 1991   left, 1991; B mastectomy   Cataract    BILATERAL-REMOVED   Coronary artery disease    s/p CABGx5 2007 //Nuclear stress test 10/18: EF 76, no ischemia or infarction, low risk   Depression    DIVERTICULOSIS    DVT    EROSIVE GASTRITIS    GERD    Heart murmur    History of DVT (deep vein thrombosis)    HYPERLIPIDEMIA    Hypertension    HYPOTHYROIDISM    Occlusion and stenosis of carotid artery without mention of cerebral infarction    Osteopenia    OSTEOPOROSIS     RHINOSINUSITIS, RECURRENT     Past Surgical History:  Procedure Laterality Date   APPENDECTOMY     COLONOSCOPY     CORONARY ARTERY BYPASS GRAFT     5        2007   MASTECTOMY     left with reconstruction and lymph node excision  1992   MASTECTOMY     right with reconstruction   REVISION RECONSTRUCTED BREAST Left 02/2014   [facility]ard (plastics in [place])   right ankle arthroscopy     TEAR DUCT CANAL     TONSILLECTOMY AND ADENOIDECTOMY     UPPER GASTROINTESTINAL ENDOSCOPY     VAGINAL HYSTERECTOMY     ovaries not excised    Current Medications: Current Meds  Medication Sig   aspirin 81 MG tablet Take 81 mg by mouth daily.     atorvastatin (LIPITOR) 20 MG tablet TAKE 1 TABLET BY MOUTH EVERY DAY   Calcium Carbonate (CALTRATE 600 PO) Take 1 tablet by mouth 2 (two) times daily.     citalopram (CELEXA) 10 MG tablet TAKE 1 TABLET BY MOUTH EVERY DAY   Coenzyme Q10 (CO Q-10 PO) Take 50 mg by mouth daily.     empagliflozin (JARDIANCE) 10 MG TABS  tablet Take 1 tablet (10 mg total) by mouth daily before breakfast.   famotidine (PEPCID) 40 MG tablet Take 1 tablet (40 mg total) by mouth daily.   fluticasone (FLONASE) 50 MCG/ACT nasal spray START ONE WEEK AFTER SURGERY Spray 1 spray(s) EVERY DAY by intranasal route for one month   furosemide (LASIX) 20 MG tablet Take 1 tablet (20 mg total) by mouth daily.   levothyroxine (SYNTHROID) 75 MCG tablet TAKE 1 TABLET BY MOUTH EVERY DAY BEFORE BREAKFAST   losartan (COZAAR) 50 MG tablet Take 1 tablet (50 mg total) by mouth daily.   meloxicam (MOBIC) 7.5 MG tablet TAKE 1/2 TO 1 TABLET EVERY DAY   metoprolol tartrate (LOPRESSOR) 25 MG tablet Take 1 tablet (25 mg total) by mouth 2 (two) times daily.   MULTIPLE VITAMIN PO Take 1 tablet by mouth daily.   Multiple Vitamins-Minerals (MULTIVITAMIN) LIQD    senna-docusate (SENOKOT-S) 8.6-50 MG per tablet Take 1 tablet by mouth daily.     VITAMIN D, CHOLECALCIFEROL, PO Take by mouth.   zolpidem (AMBIEN) 10 MG  tablet Take 0.5 tablets (5 mg total) by mouth at bedtime.     Allergies:   Clarithromycin, Clindamycin, Codeine, Levofloxacin, Morphine, Naproxen, Omeprazole, Penicillins, and Pneumococcal vaccines   Social History   Socioeconomic History   Marital status: Divorced    Spouse name: Not on file   Number of children: 2   Years of education: Not on file   Highest education level: Not on file  Occupational History   Occupation: retired  Tobacco Use   Smoking status: Never   Smokeless tobacco: Never  Vaping Use   Vaping Use: Never used  Substance and Sexual Activity   Alcohol use: No    Alcohol/week: 0.0 standard drinks of alcohol   Drug use: No   Sexual activity: Not Currently  Other Topics Concern   Not on file  Social History Narrative   Divorced x 3, lives alone   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, Teaching laboratory technician   Social Determinants of Health   Financial Resource Strain: [place]  (08/31/2021)   Overall Financial Resource Strain (CARDIA)    Difficulty of Paying Living Expenses: Not hard at all  Food Insecurity: No [place] (08/31/2021)   Hunger Vital Sign    Worried About Running Out of Food in the Last Year: Never true    [place] in the Last Year: Never true  Transportation Needs: No Transportation Needs (08/31/2021)   PRAPARE - Hydrologist (Medical): No    Lack of Transportation (Non-Medical): No  Physical Activity: Sufficiently Active (08/31/2021)   Exercise Vital Sign    Days of Exercise per Week: 7 days    Minutes of Exercise per Session: 30 min  Stress: No Stress Concern Present (08/31/2021)   [place]    Feeling of Stress : Not at all  Social Connections: Moderately Integrated (08/31/2021)   Social Connection and Isolation Panel [NHANES]    Frequency of Communication with Friends and Family: More than three times a week     Frequency of Social Gatherings with Friends and Family: More than three times a week    Attends Religious Services: More than 4 times per year    Active Member of [employer] or Organizations: Yes    Attends Music therapist: More than 4 times per year    Marital Status: Divorced  Family History: The patient's family history includes Breast cancer in her mother; Colon cancer in her sister and sister; Emphysema in her father; Lung disease in her mother. There is no history of Esophageal cancer, Rectal cancer, Stomach cancer, or Thyroid disease.  ROS:   Please see the history of present illness.    All other systems reviewed and are negative.  EKGs/Labs/Other Studies Reviewed:    The following studies were reviewed today: Echo 07/23/2022: FINDINGS   Left Ventricle: Left ventricular ejection fraction, by estimation, is 60  to 65%. The left ventricle has normal function. The left ventricle has no  regional wall motion abnormalities. The left ventricular internal cavity  size was normal in size. There is   no left ventricular hypertrophy. Left ventricular diastolic parameters  are consistent with Grade II diastolic dysfunction (pseudonormalization).  Elevated left ventricular end-diastolic pressure.   Right Ventricle: The right ventricular size is normal. No increase in  right ventricular wall thickness. Right ventricular systolic function is  normal. There is normal pulmonary artery systolic pressure. The tricuspid  regurgitant velocity is 2.09 m/s, and   with an assumed right atrial pressure of 3 mmHg, the estimated right  ventricular systolic pressure is 67.6 mmHg.   Left Atrium: Left atrial size was severely dilated.   Right Atrium: Right atrial size was normal in size.   Pericardium: There is no evidence of pericardial effusion.   Mitral Valve: The mitral valve is normal in structure. Mild mitral valve  regurgitation. No evidence of mitral valve stenosis.    Tricuspid Valve: The tricuspid valve is normal in structure. Tricuspid  valve regurgitation is trivial. No evidence of tricuspid stenosis.   Aortic Valve: Aortic valve gradients unchanged from 11/2021. The aortic  valve is calcified. There is moderate calcification of the aortic valve.  There is moderate thickening of the aortic valve. Aortic valve  regurgitation is mild. Mild to moderate aortic  stenosis is present. Aortic valve mean gradient measures 19.0 mmHg. Aortic  valve peak gradient measures 34.3 mmHg. Aortic valve area, by VTI measures  1.18 cm.   Pulmonic Valve: The pulmonic valve was normal in structure. Pulmonic valve  regurgitation is moderate to severe. No evidence of pulmonic stenosis.   Aorta: The aortic root is normal in size and structure.   Venous: The inferior vena cava is normal in size with greater than 50%  respiratory variability, suggesting right atrial pressure of 3 mmHg.   IAS/Shunts: No atrial level shunt detected by color flow Doppler.      LEFT VENTRICLE  PLAX 2D  LVIDd:         3.80 cm   Diastology  LVIDs:         2.10 cm   LV e' medial:    7.37 cm/s  LV PW:         1.10 cm   LV E/e' medial:  19.4  LV IVS:        0.90 cm   LV e' lateral:   7.87 cm/s  LVOT diam:     2.00 cm   LV E/e' lateral: 18.2  LV SV:         96  LV SV Index:   59  LVOT Area:     3.14 cm      RIGHT VENTRICLE  RV Basal diam:  3.10 cm  RV S prime:     8.06 cm/s  TAPSE (M-mode): 1.8 cm  RVSP:  20.5 mmHg   LEFT ATRIUM             Index        RIGHT ATRIUM           Index  LA diam:        3.40 cm 2.10 cm/m   RA Pressure: 3.00 mmHg  LA Vol (A2C):   65.5 ml 40.42 ml/m  RA Area:     10.00 cm  LA Vol (A4C):   63.2 ml 39.00 ml/m  RA Volume:   18.20 ml  11.23 ml/m  LA Biplane Vol: 65.0 ml 40.11 ml/m   AORTIC VALVE  AV Area (Vmax):    1.04 cm  AV Area (Vmean):   1.07 cm  AV Area (VTI):     1.18 cm  AV Vmax:           293.00 cm/s  AV Vmean:          194.500  cm/s  AV VTI:            0.814 m  AV Peak Grad:      34.3 mmHg  AV Mean Grad:      19.0 mmHg  LVOT Vmax:         96.60 cm/s  LVOT Vmean:        66.100 cm/s  LVOT VTI:          0.306 m  LVOT/AV VTI ratio: 0.38     AORTA  Ao Root diam: 2.80 cm  Ao Asc diam:  3.40 cm   MITRAL VALVE                TRICUSPID VALVE  MV Area (PHT): 3.13 cm     TR Peak grad:   17.5 mmHg  MV Decel Time: 242 msec     TR Vmax:        209.00 cm/s  MV E velocity: 143.00 cm/s  Estimated RAP:  3.00 mmHg  MV A velocity: 52.90 cm/s   RVSP:           20.5 mmHg  MV E/A ratio:  2.70                              SHUNTS                              Systemic VTI:  0.31 m                              Systemic Diam: 2.00 cm   Myocardial Perfusion Study 12/17/21:   The study is normal. Findings are consistent with no prior ischemia and no prior myocardial infarction. The study is low risk.   No ST deviation was noted.   LV perfusion is normal. There is no evidence of ischemia. There is no evidence of infarction.   Left ventricular function is normal. Nuclear stress EF: 71 %. The left ventricular ejection fraction is hyperdynamic (>65%). End diastolic cavity size is normal. End systolic cavity size is normal.   Prior study available for comparison from 09/02/2017. No changes compared to prior study.  CXR 07/14/2022: IMPRESSION: No active cardiopulmonary disease. COPD.  EKG:  EKG is ordered today.  The ekg ordered today demonstrates sinus bradycardia 54 bpm, possible age-indeterminate anterior infarct, otherwise normal.  Recent Labs: 08/04/2022: ALT  18; BUN 18; Creatinine, Ser 0.72; Hemoglobin 12.6; Platelets 238.0; Potassium 4.3; Pro B Natriuretic peptide (BNP) 232.0; Sodium 135; TSH 1.47  Recent Lipid Panel    Component Value Date/Time   CHOL 131 08/04/2022 1507   CHOL 134 07/28/2018 1153   TRIG 79.0 08/04/2022 1507   HDL 53.60 08/04/2022 1507   HDL 52 07/28/2018 1153   CHOLHDL 2 08/04/2022 1507   VLDL 15.8  08/04/2022 1507   LDLCALC 61 08/04/2022 1507   LDLCALC 71 07/30/2020 1631     Risk Assessment/Calculations:                Physical Exam:    VS:  BP 130/80   Pulse (!) 55   Ht '5\' 3"'$  (1.6 m)   Wt 130 lb 6.4 oz (59.1 kg)   SpO2 97%   BMI 23.10 kg/m     Wt Readings from Last 3 Encounters:  08/23/22 130 lb 6.4 oz (59.1 kg)  08/04/22 130 lb (59 kg)  07/14/22 132 lb (59.9 kg)     GEN:  Well nourished, well developed in no acute distress HEENT: Normal NECK: No JVD; No carotid bruits LYMPHATICS: No lymphadenopathy CARDIAC: RRR, 2/6 harsh crescendo decrescendo murmur at the right upper sternal border RESPIRATORY:  Clear to auscultation without rales, wheezing or rhonchi  ABDOMEN: Soft, non-tender, non-distended MUSCULOSKELETAL:  No edema; No deformity  SKIN: Warm and dry NEUROLOGIC:  Alert and oriented x 3 PSYCHIATRIC:  Normal affect   ASSESSMENT:    1. Coronary artery disease involving native coronary artery of native heart with angina pectoris ([place])   2. Aortic stenosis, moderate   3. Chronic diastolic heart failure ([place])   4. Essential hypertension   5. Mixed hyperlipidemia    PLAN:    In order of problems listed above:  Stable with no symptoms of angina.  Negative Myoview scan within the past 12 months.  Continue current management with aspirin and a statin drug. Stable by recent echo.  Mean transvalvular gradient 19 mmHg.  Could be considered for the Progress trial at some point if her aortic stenosis progresses into being clearly in moderate range.  Plan repeat echo in 6 months with follow-up at that time. We are going to try to get her assistance for Jardiance.  There is good data that this could help her over time with respect to her diastolic heart failure.  She has both elevated BNP and evidence of diastolic dysfunction on echo with associated symptoms New York Heart Association functional class II. Blood pressure well controlled on furosemide, losartan, and  metoprolol. Treated with a statin drug.  Last lipids with a cholesterol 131, HDL 54, LDL 61.     Medication Adjustments/Labs and Tests Ordered: Current medicines are reviewed at length with the patient today.  Concerns regarding medicines are outlined above.  Orders Placed This Encounter  Procedures   EKG 12-Lead   ECHOCARDIOGRAM COMPLETE   Meds ordered this encounter  Medications   empagliflozin (JARDIANCE) 10 MG TABS tablet    Sig: Take 1 tablet (10 mg total) by mouth daily before breakfast.    Dispense:  30 tablet    Refill:  11    Patient Instructions  Medication Instructions:  START Jardiance '10mg'$  daily *If you need a refill on your cardiac medications before your next appointment, please call your pharmacy*   Lab Work: NONE If you have labs (blood work) drawn today and your tests are completely normal, you will receive your results only by:  MyChart Message (if you have MyChart) OR A paper copy in the mail If you have any lab test that is abnormal or we need to change your treatment, we will call you to review the results.   Testing/Procedures: ECHO (prior to appt in 6 months) Your physician has requested that you have an echocardiogram. Echocardiography is a painless test that uses sound waves to create images of your heart. It provides your doctor with information about the size and shape of your heart and how well your heart's chambers and valves are working. This procedure takes approximately one hour. There are no restrictions for this procedure.  Follow-Up: At [facility], you and your health needs are our priority.  As part of our continuing mission to provide you with exceptional heart care, we have created designated Provider Care Teams.  These Care Teams include your primary Cardiologist (physician) and Advanced Practice Providers (APPs -  Physician Assistants and Nurse Practitioners) who all work together to provide you with the care you need, when you  need it.  Your next appointment:   6 month(s)  The format for your next appointment:   In Person  Provider:   [name], MD       Important Information About Sugar         Signed, [name], MD  08/23/2022 4:27 PM    [place]

## 2022-08-23 NOTE — Patient Instructions (Signed)
Medication Instructions:  START Jardiance '10mg'$  daily *If you need a refill on your cardiac medications before your next appointment, please call your pharmacy*   Lab Work: NONE If you have labs (blood work) drawn today and your tests are completely normal, you will receive your results only by: [place] (if you have MyChart) OR A paper copy in the mail If you have any lab test that is abnormal or we need to change your treatment, we will call you to review the results.   Testing/Procedures: ECHO (prior to appt in 6 months) Your physician has requested that you have an echocardiogram. Echocardiography is a painless test that uses sound waves to create images of your heart. It provides your doctor with information about the size and shape of your heart and how well your heart's chambers and valves are working. This procedure takes approximately one hour. There are no restrictions for this procedure.  Follow-Up: At [facility], you and your health needs are our priority.  As part of our continuing mission to provide you with exceptional heart care, we have created designated Provider Care Teams.  These Care Teams include your primary Cardiologist (physician) and Advanced Practice Providers (APPs -  Physician Assistants and Nurse Practitioners) who all work together to provide you with the care you need, when you need it.  Your next appointment:   6 month(s)  The format for your next appointment:   In Person  Provider:   [name], MD       Important Information About Sugar

## 2022-08-23 NOTE — Patient Outreach (Signed)
  Care Coordination   Initial Visit Note   08/23/2022 Name: [name] MRN: [id number] DOB: 09-16-1941  [name] is a 81 y.o. year old female who sees Burns, Claudina Lick, MD for primary care. I spoke with  [name] by phone today.  What matters to the patients health and wellness today?  Patient reports she is on the way to see her cardiologist. Request to schedule a time to complete assessment.    Goals Addressed             This Visit's Progress    COMPLETED: Care Coordination Activities       Care Coordination Interventions: Discussed care coordination program Scheduled date/time to complete assessment       SDOH assessments and interventions completed:  No  Care Coordination Interventions Activated:  Yes  Care Coordination Interventions:  Yes, provided   Follow up plan: Follow up call scheduled for 08/24/22    Encounter Outcome:  Pt. Visit Completed   Angelica Silversmith, RN, MSN, BSN, [place] Coordinator [phone]

## 2022-08-24 ENCOUNTER — Telehealth: Payer: Self-pay

## 2022-08-24 NOTE — Telephone Encounter (Signed)
**Note De-Identified  Obfuscation** We received a letter from [facility] stating that they need the pts proof of income.  I called the pt but got no answer so I left a VM asking her to call Jeani Hawking at Dr Antionette Char office at [phone] and that if she calls back after 2:00 today to leave a message for me or to ask to s/w Dr York Cerise nurse.

## 2022-08-24 NOTE — Telephone Encounter (Signed)
**Note De-Identified  Obfuscation** The pt called me back and she states that she will bring her original [employer] application and her 1537 proof of income to the office.  She is aware to ask the front office staff to make copies of her application and her proof of income and to request that the copies be given to [facility] or Dr York Cerise nurse, Judson Roch.  She verbalized understanding and thanked me for letting her know that [employer] needs her proof of income.

## 2022-08-24 NOTE — Telephone Encounter (Signed)
**Note De-Identified  Obfuscation** The pts [employer] pt asst application for Jardiance was faxed to me but it cannot be printed off because it was scanned and e-mailed to me sideways and will not print correctly.  Per Dr York Cerise nurse, she e-mailed me a copy and that she faxed the pts [employer] pt asst application for Jardiance assistance to [facility] yesterday and that she did receive confirmation that the fax sent successfully.  Dr York Cerise nurse also states that she gave the pt her application back so we have no copy.  I will call [employer] to check on the pts application later today.   If [employer] needs anything else concerning the pts application going forward or if this application was not received by them the pt will need to re-apply through [facility].

## 2022-08-25 NOTE — Telephone Encounter (Signed)
**Note De-Identified Niala Stcharles Obfuscation** The pt did bring her [employer] application and her proof of income to the office to drop off. I have faxed her proof of income to [facility] as requested. Fax: Tx 'ok' Report CONE_EMAIL-to-Fax Kullen Tomasetti, Mardene Celeste   This message was sent Kieron Kantner [facility], a product from [employer] [email or web address]                    -------Fax Transmission Report-------  To:               Recipient at [phone] Subject:          Fw: Proof of Income Result:           The transmission was successful. Explanation:      All Pages Ok Pages Sent:       4 Connect Time:     3 minutes, 29 seconds Transmit Time:    08/25/2022 08:16 Transfer Rate:    14400 Status Code:      0000 Retry Count:      0 Job Id:           [id number] Unique Id:        [id number] Fax Line:         24 Fax Server:       [employer]

## 2022-08-26 NOTE — Telephone Encounter (Signed)
**Note De-Identified Rosanne Wohlfarth Obfuscation** Letter received [name] fax from [facility] stating that they have denied the pt assistance with [name] because her income exceeds the current program limits. The letter states that they have notified the pt of this denial as well.

## 2022-08-29 NOTE — Telephone Encounter (Signed)
If not covered I think we will have to just keep her on current medications. thx

## 2022-08-30 ENCOUNTER — Telehealth: Payer: Self-pay | Admitting: Internal Medicine

## 2022-08-30 ENCOUNTER — Ambulatory Visit: Payer: Medicare Other

## 2022-08-30 NOTE — Telephone Encounter (Signed)
Pt called stating that she would like a call back from Nurse Angela Nevin because she wants to go over her nerve medications and that she is under high stress and would just like to speak to Dr. Quay Burow nurse about her general concerns.

## 2022-08-30 NOTE — Telephone Encounter (Signed)
Called and spoke with patient who states that she will pay for jardiance for at least 6 months until she has the next ECHO and will decide based on the results if she wants to continue thereafter.

## 2022-09-01 MED ORDER — CITALOPRAM HYDROBROMIDE 10 MG PO TABS: 20.0000 mg | ORAL_TABLET | Freq: Every day | ORAL | 2 refills | Status: DC

## 2022-09-01 NOTE — Telephone Encounter (Signed)
Spoke with patient today. 

## 2022-09-01 NOTE — Telephone Encounter (Signed)
I did see the note from cardiology.  She can increase the citalopram to 20 mg daily-it is probably best to increase this to every day versus taking an extra dose here and there which will not help as much.  The difficulty breathing and belching may be related to stress.  She needs to make sure her heartburn or reflux is well controlled with her medication if not we may need to adjust that.  If symptoms or not improving she should come see me.

## 2022-09-02 ENCOUNTER — Ambulatory Visit (INDEPENDENT_AMBULATORY_CARE_PROVIDER_SITE_OTHER): Payer: Medicare Other

## 2022-09-02 DIAGNOSIS — Z0000 Encounter for general adult medical examination without abnormal findings: Secondary | ICD-10-CM

## 2022-09-02 NOTE — Patient Instructions (Signed)
[name] , Thank you for taking time to come for your Medicare Wellness Visit. I appreciate your ongoing commitment to your health goals. Please review the following plan we discussed and let me know if I can assist you in the future.   These are the goals we discussed:  Goals      Patient Stated     Continue to be socially active and enjoy my friends, Psychologist, occupational, stay active in the church. Take time for me to do relaxing things like read, garden and do my devotions.         This is a list of the screening recommended for you and due dates:  Health Maintenance  Topic Date Due   COVID-19 Vaccine (5 - Pfizer risk series) 10/06/2021   DEXA scan (bone density measurement)  10/03/2023   Tetanus Vaccine  05/30/2024   Pneumonia Vaccine  Completed   Flu Shot  Completed   Zoster (Shingles) Vaccine  Completed   HPV Vaccine  Aged Out    Advanced directives: Yes; Please bring a copy of your health care power of attorney and living will to the office at your convenience.  Conditions/risks identified: Yes  Next appointment: Follow up in one year for your annual wellness visit.   Preventive Care 50 Years and Older, Female Preventive care refers to lifestyle choices and visits with your health care provider that can promote health and wellness. What does preventive care include? A yearly physical exam. This is also called an annual well check. Dental exams once or twice a year. Routine eye exams. Ask your health care provider how often you should have your eyes checked. Personal lifestyle choices, including: Daily care of your teeth and gums. Regular physical activity. Eating a healthy diet. Avoiding tobacco and drug use. Limiting alcohol use. Practicing safe sex. Taking low-dose aspirin every day. Taking vitamin and mineral supplements as recommended by your health care provider. What happens during an annual well check? The services and screenings done by your health care provider  during your annual well check will depend on your age, overall health, lifestyle risk factors, and family history of disease. Counseling  Your health care provider may ask you questions about your: Alcohol use. Tobacco use. Drug use. Emotional well-being. Home and relationship well-being. Sexual activity. Eating habits. History of falls. Memory and ability to understand (cognition). Work and work Statistician. Reproductive health. Screening  You may have the following tests or measurements: Height, weight, and BMI. Blood pressure. Lipid and cholesterol levels. These may be checked every 5 years, or more frequently if you are over 95 years old. Skin check. Lung cancer screening. You may have this screening every year starting at age 42 if you have a 30-pack-year history of smoking and currently smoke or have quit within the past 15 years. Fecal occult blood test (FOBT) of the stool. You may have this test every year starting at age 60. Flexible sigmoidoscopy or colonoscopy. You may have a sigmoidoscopy every 5 years or a colonoscopy every 10 years starting at age 40. Hepatitis C blood test. Hepatitis B blood test. Sexually transmitted disease (STD) testing. Diabetes screening. This is done by checking your blood sugar (glucose) after you have not eaten for a while (fasting). You may have this done every 1-3 years. Bone density scan. This is done to screen for osteoporosis. You may have this done starting at age 52. Mammogram. This may be done every 1-2 years. Talk to your health care provider about how  often you should have regular mammograms. Talk with your health care provider about your test results, treatment options, and if necessary, the need for more tests. Vaccines  Your health care provider may recommend certain vaccines, such as: Influenza vaccine. This is recommended every year. Tetanus, diphtheria, and acellular pertussis (Tdap, Td) vaccine. You may need a Td booster every  10 years. Zoster vaccine. You may need this after age 37. Pneumococcal 13-valent conjugate (PCV13) vaccine. One dose is recommended after age 74. Pneumococcal polysaccharide (PPSV23) vaccine. One dose is recommended after age 50. Talk to your health care provider about which screenings and vaccines you need and how often you need them. This information is not intended to replace advice given to you by your health care provider. Make sure you discuss any questions you have with your health care provider. Document Released: 11/28/2015 Document Revised: 07/21/2016 Document Reviewed: 09/02/2015 Elsevier Interactive Patient Education  2017 [place] Prevention in the Home Falls can cause injuries. They can happen to people of all ages. There are many things you can do to make your home safe and to help prevent falls. What can I do on the outside of my home? Regularly fix the edges of walkways and driveways and fix any cracks. Remove anything that might make you trip as you walk through a door, such as a raised step or threshold. Trim any bushes or trees on the path to your home. Use bright outdoor lighting. Clear any walking paths of anything that might make someone trip, such as rocks or tools. Regularly check to see if handrails are loose or broken. Make sure that both sides of any steps have handrails. Any raised decks and porches should have guardrails on the edges. Have any leaves, snow, or ice cleared regularly. Use sand or salt on walking paths during winter. Clean up any spills in your garage right away. This includes oil or grease spills. What can I do in the bathroom? Use night lights. Install grab bars by the toilet and in the tub and shower. Do not use towel bars as grab bars. Use non-skid mats or decals in the tub or shower. If you need to sit down in the shower, use a plastic, non-slip stool. Keep the floor dry. Clean up any water that spills on the floor as soon as it  happens. Remove soap buildup in the tub or shower regularly. Attach bath mats securely with double-sided non-slip rug tape. Do not have throw rugs and other things on the floor that can make you trip. What can I do in the bedroom? Use night lights. Make sure that you have a light by your bed that is easy to reach. Do not use any sheets or blankets that are too big for your bed. They should not hang down onto the floor. Have a firm chair that has side arms. You can use this for support while you get dressed. Do not have throw rugs and other things on the floor that can make you trip. What can I do in the kitchen? Clean up any spills right away. Avoid walking on wet floors. Keep items that you use a lot in easy-to-reach places. If you need to reach something above you, use a strong step stool that has a grab bar. Keep electrical cords out of the way. Do not use floor polish or wax that makes floors slippery. If you must use wax, use non-skid floor wax. Do not have throw rugs and other things  on the floor that can make you trip. What can I do with my stairs? Do not leave any items on the stairs. Make sure that there are handrails on both sides of the stairs and use them. Fix handrails that are broken or loose. Make sure that handrails are as long as the stairways. Check any carpeting to make sure that it is firmly attached to the stairs. Fix any carpet that is loose or worn. Avoid having throw rugs at the top or bottom of the stairs. If you do have throw rugs, attach them to the floor with carpet tape. Make sure that you have a light switch at the top of the stairs and the bottom of the stairs. If you do not have them, ask someone to add them for you. What else can I do to help prevent falls? Wear shoes that: Do not have high heels. Have rubber bottoms. Are comfortable and fit you well. Are closed at the toe. Do not wear sandals. If you use a stepladder: Make sure that it is fully opened.  Do not climb a closed stepladder. Make sure that both sides of the stepladder are locked into place. Ask someone to hold it for you, if possible. Clearly mark and make sure that you can see: Any grab bars or handrails. First and last steps. Where the edge of each step is. Use tools that help you move around (mobility aids) if they are needed. These include: Canes. Walkers. Scooters. Crutches. Turn on the lights when you go into a dark area. Replace any light bulbs as soon as they burn out. Set up your furniture so you have a clear path. Avoid moving your furniture around. If any of your floors are uneven, fix them. If there are any pets around you, be aware of where they are. Review your medicines with your doctor. Some medicines can make you feel dizzy. This can increase your chance of falling. Ask your doctor what other things that you can do to help prevent falls. This information is not intended to replace advice given to you by your health care provider. Make sure you discuss any questions you have with your health care provider. Document Released: 08/28/2009 Document Revised: 04/08/2016 Document Reviewed: 12/06/2014 Elsevier Interactive Patient Education  2017 [employer].

## 2022-09-02 NOTE — Progress Notes (Signed)
Virtual Visit via Telephone Note  I connected with  [name] on 09/02/22 at  2:00 PM EDT by telephone and verified that I am speaking with the correct person using two identifiers.  Location: Patient: Home Provider: [place] Persons participating in the virtual visit: [place]   I discussed the limitations, risks, security and privacy concerns of performing an evaluation and management service by telephone and the availability of in person appointments. The patient expressed understanding and agreed to proceed.  Interactive audio and video telecommunications were attempted between this nurse and patient, however failed, due to patient having technical difficulties OR patient did not have access to video capability.  We continued and completed visit with audio only.  Some vital signs may be absent or patient reported.   Sheral Flow, LPN  Subjective:   [name] is a 81 y.o. female who presents for Medicare Annual (Subsequent) preventive examination.  Review of Systems     Cardiac Risk Factors include: advanced age (>30mn, >>61women);dyslipidemia;hypertension;sedentary lifestyle     Objective:    There were no vitals filed for this visit. There is no height or weight on file to calculate BMI.     09/02/2022    2:15 PM 12/10/2021    8:49 PM 08/31/2021    5:25 PM 08/27/2020    3:46 PM 07/30/2019    1:22 PM 07/19/2018   11:46 AM 07/13/2017    3:26 PM  Advanced Directives  Does Patient Have a Medical Advance Directive? Yes No Yes Yes Yes Yes Yes  Type of AParamed[place]of [place] Terr[place] will  Living will;Healthcare Power of [place]outh [place] will [place] will [place] will [place]Living will  Does patient want to make changes to medical advance directive?   No - Patient declined No - Patient declined     Copy of  [place] Chart? No - copy requested  No - copy requested No - copy requested No - copy requested No - copy requested No - copy requested  Would patient like information on creating a medical advance directive?  No - Patient declined         Current Medications (verified) Outpatient Encounter Medications as of 09/02/2022  Medication Sig   aspirin 81 MG tablet Take 81 mg by mouth daily.     atorvastatin (LIPITOR) 20 MG tablet TAKE 1 TABLET BY MOUTH EVERY DAY   Calcium Carbonate (CALTRATE 600 PO) Take 1 tablet by mouth 2 (two) times daily.     citalopram (CELEXA) 10 MG tablet Take 2 tablets (20 mg total) by mouth daily.   Coenzyme Q10 (CO Q-10 PO) Take 50 mg by mouth daily.     empagliflozin (JARDIANCE) 10 MG TABS tablet Take 1 tablet (10 mg total) by mouth daily before breakfast.   famotidine (PEPCID) 40 MG tablet Take 1 tablet (40 mg total) by mouth daily.   fluticasone (FLONASE) 50 MCG/ACT nasal spray START ONE WEEK AFTER SURGERY Spray 1 spray(s) EVERY DAY by intranasal route for one month   furosemide (LASIX) 20 MG tablet Take 1 tablet (20 mg total) by mouth daily.   levothyroxine (SYNTHROID) 75 MCG tablet TAKE 1 TABLET BY MOUTH EVERY DAY BEFORE BREAKFAST   losartan (COZAAR) 50 MG tablet Take 1 tablet (50 mg total) by mouth daily.   meloxicam (MOBIC) 7.5 MG tablet TAKE 1/2 TO 1 TABLET EVERY DAY  metoprolol tartrate (LOPRESSOR) 25 MG tablet Take 1 tablet (25 mg total) by mouth 2 (two) times daily.   MULTIPLE VITAMIN PO Take 1 tablet by mouth daily.   Multiple Vitamins-Minerals (MULTIVITAMIN) LIQD    senna-docusate (SENOKOT-S) 8.6-50 MG per tablet Take 1 tablet by mouth daily.     VITAMIN D, CHOLECALCIFEROL, PO Take by mouth.   zolpidem (AMBIEN) 10 MG tablet Take 0.5 tablets (5 mg total) by mouth at bedtime.   No facility-administered encounter medications on file as of 09/02/2022.    Allergies (verified) Clarithromycin, Clindamycin, Codeine, Levofloxacin,  Morphine, Naproxen, Omeprazole, Penicillins, and Pneumococcal vaccines   History: Past Medical History:  Diagnosis Date   ALLERGIC RHINITIS    Allergy    SEASONAL   Anxiety    Aortic stenosis, moderate    Blood transfusion without reported diagnosis    BREAST CANCER 1991   left, 1991; B mastectomy   Cataract    BILATERAL-REMOVED   Coronary artery disease    s/p CABGx5 2007 //Nuclear stress test 10/18: EF 76, no ischemia or infarction, low risk   Depression    DIVERTICULOSIS    DVT    EROSIVE GASTRITIS    GERD    Heart murmur    History of DVT (deep vein thrombosis)    HYPERLIPIDEMIA    Hypertension    HYPOTHYROIDISM    Occlusion and stenosis of carotid artery without mention of cerebral infarction    Osteopenia    OSTEOPOROSIS    RHINOSINUSITIS, RECURRENT    Past Surgical History:  Procedure Laterality Date   APPENDECTOMY     COLONOSCOPY     CORONARY ARTERY BYPASS GRAFT     5        2007   MASTECTOMY     left with reconstruction and lymph node excision  1992   MASTECTOMY     right with reconstruction   REVISION RECONSTRUCTED BREAST Left 02/2014   willard (plastics in [place])   right ankle arthroscopy     TEAR DUCT CANAL     TONSILLECTOMY AND ADENOIDECTOMY     UPPER GASTROINTESTINAL ENDOSCOPY     VAGINAL HYSTERECTOMY     ovaries not excised   Family History  Problem Relation Age of Onset   Colon cancer Sister    Colon cancer Sister    Breast cancer Mother    Lung disease Mother    Emphysema Father    Esophageal cancer Neg Hx    Rectal cancer Neg Hx    Stomach cancer Neg Hx    Thyroid disease Neg Hx    Social History   Socioeconomic History   Marital status: Divorced    Spouse name: Not on file   Number of children: 2   Years of education: Not on file   Highest education level: Not on file  Occupational History   Occupation: retired  Tobacco Use   Smoking status: Never   Smokeless tobacco: Never  Vaping Use   Vaping Use: Never used  Substance and  Sexual Activity   Alcohol use: No    Alcohol/week: 0.0 standard drinks of alcohol   Drug use: No   Sexual activity: Not Currently  Other Topics Concern   Not on file  Social History Narrative   Divorced x 3, lives alone   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, Teaching laboratory technician   Social Determinants of Health   Financial Resource Strain: Low Risk  (09/02/2022)   Overall Emergency planning/management officer Strain (  CARDIA)    Difficulty of Paying Living Expenses: Not hard at all  Food Insecurity: No Food Insecurity (09/02/2022)   Hunger Vital Sign    Worried About Running Out of Food in the Last Year: Never true    Ran Out of Food in the Last Year: Never true  Transportation Needs: No Transportation Needs (09/02/2022)   PRAPARE - Hydrologist (Medical): No    Lack of Transportation (Non-Medical): No  Physical Activity: Inactive (09/02/2022)   Exercise Vital Sign    Days of Exercise per Week: 0 days    Minutes of Exercise per Session: 0 min  Stress: No Stress Concern Present (09/02/2022)   [place]    Feeling of Stress : Not at all  Social Connections: Moderately Integrated (09/02/2022)   Social Connection and Isolation Panel [NHANES]    Frequency of Communication with Friends and Family: More than three times a week    Frequency of Social Gatherings with Friends and Family: More than three times a week    Attends Religious Services: More than 4 times per year    Active Member of [employer] or Organizations: Yes    Attends Music therapist: More than 4 times per year    Marital Status: Divorced    Tobacco Counseling Counseling given: Not Answered   Clinical Intake:  Pre-visit preparation completed: Yes  Pain : No/denies pain     BMI - recorded: 23.11 (08/23/2022) Nutritional Status: BMI of 19-24  Normal Nutritional Risks: None Diabetes: No  How often do you need to  have someone help you when you read instructions, pamphlets, or other written materials from your doctor or pharmacy?: 1 - Never What is the last grade level you completed in school?: HSG; 1 year of college  Diabetic? no  Interpreter Needed?: No  Information entered by :: Lisette Abu, LPN.   Activities of Daily Living    09/02/2022    2:40 PM  In your present state of health, do you have any difficulty performing the following activities:  Hearing? 0  Vision? 0  Difficulty concentrating or making decisions? 0  Walking or climbing stairs? 0  Dressing or bathing? 0  Doing errands, shopping? 0  Preparing Food and eating ? N  Using the Toilet? N  In the past six months, have you accidently leaked urine? Y  Do you have problems with loss of bowel control? N  Managing your Medications? N  Managing your Finances? N  Housekeeping or managing your Housekeeping? N    Patient Care Team: [name], MD as PCP - General (Internal Medicine) [name], MD as PCP - Cardiology (Cardiology) [name], MD as Consulting Physician (Cardiology) Deneise Lever, MD as Consulting Physician (Pulmonary Disease) Irene Shipper, MD as Consulting Physician (Gastroenterology) Danella Sensing, MD as Consulting Physician (Dermatology) Loletta Specter, MD (Plastic Surgery) [name], MD (Obstetrics and Gynecology) Jola Schmidt, MD as Consulting Physician (Ophthalmology)  Indicate any recent Medical Services you may have received from other than [facility] providers in the past year (date may be approximate).     Assessment:   This is a routine wellness examination for Craig.  Hearing/Vision screen Hearing Screening - Comments:: Patient wears hearing aids. Vision Screening - Comments:: Wears rx glasses - up to date with routine eye exams with Jola Schmidt, MD.   Dietary issues and exercise activities discussed: Current Exercise Habits: The patient does not  participate  in regular exercise at present, Exercise limited by: orthopedic condition(s);cardiac condition(s)   Goals Addressed   None   Depression Screen    08/04/2022    2:12 PM 08/31/2021    5:23 PM 08/27/2020    3:43 PM 07/30/2019    1:19 PM 07/19/2018   11:47 AM 07/13/2017    3:26 PM 09/01/2016    4:30 PM  PHQ 2/9 Scores  PHQ - 2 Score 0 0 0 0 1 0 0  PHQ- 9 Score     4 0     Fall Risk    09/02/2022    2:16 PM 08/04/2022    2:24 PM 08/04/2022    2:12 PM 08/31/2021    5:26 PM 08/27/2020    3:46 PM  [place] in the past year? 0 0 0 0 0  Number falls in past yr: 0 0 0 0 0  Injury with Fall? 0 0 0 0 0  Risk for fall due to : No Fall Risks No Fall Risks No Fall Risks No Fall Risks No Fall Risks  Follow up Falls prevention discussed Falls evaluation completed Falls evaluation completed Falls evaluation completed Falls evaluation completed;Education provided    FALL RISK PREVENTION PERTAINING TO THE HOME:  Any stairs in or around the home? Yes  If so, are there any without handrails? No  Home free of loose throw rugs in walkways, pet beds, electrical cords, etc? Yes  Adequate lighting in your home to reduce risk of falls? Yes   ASSISTIVE DEVICES UTILIZED TO PREVENT FALLS:  Life alert? No  Use of a cane, walker or w/c? No  Grab bars in the bathroom? No  Shower chair or bench in shower? No  Elevated toilet seat or a handicapped toilet? Yes   TIMED UP AND GO:  Was the test performed? No . Phone Visit   Cognitive Function:    07/19/2018   12:44 PM 07/13/2017    4:15 PM  MMSE - Mini Mental State Exam  Orientation to time 5 5  Orientation to Place 5 5  Registration 3 3  Attention/ Calculation 5 5  Recall 1 2  Language- name 2 objects 2 2  Language- repeat 1 1  Language- follow 3 step command 3 3  Language- read & follow direction 1 1  Write a sentence 1 1  Copy design 1 1  Total score 28 29        09/02/2022    2:41 PM 08/27/2020    3:48 PM  6CIT Screen  What  Year? 0 points 0 points  What month? 0 points 0 points  What time? 0 points 0 points  Count back from 20 0 points 0 points  Months in reverse 0 points 0 points  Repeat phrase 0 points 0 points  Total Score 0 points 0 points    Immunizations Immunization History  Administered Date(s) Administered   Fluad Quad(high Dose 65+) 07/25/2019, 07/30/2020, 08/04/2021, 08/04/2022   H1N1 11/13/2008   Influenza Split 08/10/2011, 08/28/2012   Influenza Whole 08/05/2008, 08/18/2009, 08/05/2010   Influenza, High Dose Seasonal PF 07/13/2017, 07/19/2018   Influenza,inj,Quad PF,6+ Mos 08/28/2013, 12/11/2014, 07/30/2015, 07/21/2016   PFIZER(Purple Top)SARS-COV-2 Vaccination 12/05/2019, 12/26/2019, 08/19/2020   PNEUMOCOCCAL CONJUGATE-20 08/04/2022   Pfizer Covid-19 Vaccine Bivalent Booster 56yr & up 08/11/2021   Pneumococcal Conjugate-13 09/05/2013, 05/29/2015   Pneumococcal-Unspecified 09/05/2013   Tetanus 05/30/2014   Zoster Recombinat (Shingrix) 03/08/2018, 05/08/2018   Zoster, Live 10/06/2013  TDAP status: Up to date  Flu Vaccine status: Up to date  Pneumococcal vaccine status: Up to date  Covid-19 vaccine status: Completed vaccines  Qualifies for Shingles Vaccine? Yes   Zostavax completed Yes   Shingrix Completed?: Yes  Screening Tests Health Maintenance  Topic Date Due   COVID-19 Vaccine (5 - Pfizer risk series) 10/06/2021   DEXA SCAN  10/03/2023   TETANUS/TDAP  05/30/2024   Pneumonia Vaccine 75+ Years old  Completed   INFLUENZA VACCINE  Completed   Zoster Vaccines- Shingrix  Completed   HPV VACCINES  Aged Out    Health Maintenance  Health Maintenance Due  Topic Date Due   COVID-19 Vaccine (5 - Pfizer risk series) 10/06/2021    Colorectal cancer screening: No longer required.   Mammogram status: Completed 05/14/2022. Repeat every year  Bone Density status: Completed 10/02/2021. Results reflect: Bone density results: OSTEOPOROSIS. Repeat every 2 years.  Lung Cancer  Screening: (Low Dose CT Chest recommended if Age 64-80 years, 30 pack-year currently smoking OR have quit w/in 15years.) does not qualify.   Lung Cancer Screening Referral: no  Additional Screening:  Hepatitis C Screening: does not qualify; Completed no  Vision Screening: Recommended annual ophthalmology exams for early detection of glaucoma and other disorders of the eye. Is the patient up to date with their annual eye exam?  Yes  Who is the provider or what is the name of the office in which the patient attends annual eye exams? Jola Schmidt, MD. If pt is not established with a provider, would they like to be referred to a provider to establish care? No .   Dental Screening: Recommended annual dental exams for proper oral hygiene  Community Resource Referral / Chronic Care Management: CRR required this visit?  No   CCM required this visit?  No      Plan:     I have personally reviewed and noted the following in the patient's chart:   Medical and social history Use of alcohol, tobacco or illicit drugs  Current medications and supplements including opioid prescriptions. Patient is not currently taking opioid prescriptions. Functional ability and status Nutritional status Physical activity Advanced directives List of other physicians Hospitalizations, surgeries, and ER visits in previous 12 months Vitals Screenings to include cognitive, depression, and falls Referrals and appointments  In addition, I have reviewed and discussed with patient certain preventive protocols, quality metrics, and best practice recommendations. A written personalized care plan for preventive services as well as general preventive health recommendations were provided to patient.     Sheral Flow, LPN   [id number]   Nurse Notes: N/A

## 2022-09-04 ENCOUNTER — Other Ambulatory Visit: Payer: Self-pay | Admitting: Internal Medicine

## 2022-09-10 ENCOUNTER — Telehealth: Payer: Self-pay | Admitting: Cardiovascular Disease

## 2022-09-10 NOTE — Telephone Encounter (Signed)
Pt c/o medication issue:  1. Name of Medication: empagliflozin (JARDIANCE) 10 MG TABS tablet  2. How are you currently taking this medication (dosage and times per day)? As prescribed   3. Are you having a reaction (difficulty breathing--STAT)? Yes   4. What is your medication issue? Patient is calling reporting she has been nauseous, not able to eat, vomiting, and weak since starting this medication.

## 2022-09-10 NOTE — Telephone Encounter (Signed)
Returned call to patient who states she started taking the Jardiance on 08/30/22 and states symptoms began almost same time. Condones feeling weak, no appetite, nauseous, and a couple of episodes of vomiting. Questioned when she takes and she takes in the morning, along with a banana and/or cereal. She denies much more intake than this throughout the day. Advised her that it could be an intolerance to the drug, but may very likely be that it's dropping her blood glucose. Recommended that she hold the drug until 09/15/22 or no longer than 1 week, to allow her body time to stabilize (this could potentially be a separate issue such as a virus/bug), then retrial drug, but to take with largest meal of the day and juice. She agrees to plan and will call us back once she retrials to let us know how she's doing.

## 2022-09-11 ENCOUNTER — Other Ambulatory Visit: Payer: Self-pay | Admitting: Internal Medicine

## 2022-09-17 ENCOUNTER — Telehealth: Payer: Self-pay | Admitting: Cardiovascular Disease

## 2022-09-17 ENCOUNTER — Telehealth: Payer: Self-pay | Admitting: Internal Medicine

## 2022-09-17 MED ORDER — CITALOPRAM HYDROBROMIDE 10 MG PO TABS: 10.0000 mg | ORAL_TABLET | Freq: Every day | ORAL | 2 refills | Status: DC

## 2022-09-17 NOTE — Telephone Encounter (Signed)
Spoke with patient today and info given. 

## 2022-09-17 NOTE — Telephone Encounter (Signed)
Yes - she should just take one pill daily

## 2022-09-17 NOTE — Telephone Encounter (Signed)
Pt says she started taking CITALOPRAM double dose for one day and single dose the next; alternating as advised.  Symptoms since alternating dose are:  Hearing echos in her ears, "driving her crazy".  Question? Can she stop the double dosing and just take one pill per day.  Please call pt to advise [phone]

## 2022-09-17 NOTE — Telephone Encounter (Signed)
Pt c/o medication issue:  1. Name of Medication:   empagliflozin (JARDIANCE) 10 MG TABS tablet    2. How are you currently taking this medication (dosage and times per day)? 1 tablet daily  3. Are you having a reaction (difficulty breathing--STAT)? no  4. What is your medication issue? Patient states she stopped her jardiance for 4 days and just restarted it yesterday. She says while she was off it she has started to hear a humming echo sound after she sings. She says it just keeps going. She says she does not feel any vibration.

## 2022-09-21 NOTE — Telephone Encounter (Signed)
Returned call to patient who states that she has again stopped the jardiance. She has a lot of anxiety and is dealing with what she states is quite a bit in her personal life and is currently feeling overwhelmed. She wanted education on why she was being prescribed the jardiance. I informed her that with her leaking valve, her narrowed aortic valve, and her diastolic HF, the medication is going to help her heart to pump more effectively and take some of the work load off of her heart. Spoke in detail about this and the repeat ECHO in the future. She appreciates call. States she will again begin to take the [place]. She only resumed it for 3-4 days, so was not long enough to know if she would again feel the weakness, nausea, etc that she felt last month when she began it initially. Informed her that the humming/echo in her head sounds related more to sinuses than this medication. Advised that she try Flonase OTC. States she is trying to get an appt with an ENT because she does suffer from sinus issues and she feels like it's likely not the Jardiance either. She is again going to restart medication and will let us know how it goes.

## 2022-09-22 ENCOUNTER — Ambulatory Visit: Payer: Medicare Other | Admitting: Internal Medicine

## 2022-09-22 ENCOUNTER — Telehealth: Payer: Self-pay | Admitting: Internal Medicine

## 2022-09-22 DIAGNOSIS — H903 Sensorineural hearing loss, bilateral: Secondary | ICD-10-CM | POA: Diagnosis not present

## 2022-09-22 DIAGNOSIS — R440 Auditory hallucinations: Secondary | ICD-10-CM | POA: Diagnosis not present

## 2022-09-22 NOTE — Telephone Encounter (Signed)
Pt called to say ENT provider today said she has hallucinations. Pt asking if it is due to Jardiance she started taking again yesterday. Pt asking PCP to please re-examine all her medications to see if the hallucinations are coming from any of them.

## 2022-09-23 NOTE — Telephone Encounter (Signed)
Left message for patient to return call to clinic.  If she calls back please schedule OV with Dr. Quay Burow.

## 2022-09-27 ENCOUNTER — Emergency Department (HOSPITAL_COMMUNITY)
Admission: EM | Admit: 2022-09-27 | Discharge: 2022-09-27 | Disposition: A | Discharge status: Home / Self Care | Payer: Medicare Other | Attending: Emergency Medicine | Admitting: Emergency Medicine

## 2022-09-27 ENCOUNTER — Encounter: Payer: Self-pay | Admitting: Internal Medicine

## 2022-09-27 ENCOUNTER — Other Ambulatory Visit: Payer: Self-pay

## 2022-09-27 ENCOUNTER — Encounter (HOSPITAL_COMMUNITY): Payer: Self-pay | Admitting: Emergency Medicine

## 2022-09-27 ENCOUNTER — Emergency Department (HOSPITAL_COMMUNITY): Payer: Medicare Other

## 2022-09-27 DIAGNOSIS — I5032 Chronic diastolic (congestive) heart failure: Secondary | ICD-10-CM | POA: Diagnosis not present

## 2022-09-27 DIAGNOSIS — Z1152 Encounter for screening for COVID-19: Secondary | ICD-10-CM | POA: Diagnosis not present

## 2022-09-27 DIAGNOSIS — R531 Weakness: Secondary | ICD-10-CM | POA: Insufficient documentation

## 2022-09-27 DIAGNOSIS — I2510 Atherosclerotic heart disease of native coronary artery without angina pectoris: Secondary | ICD-10-CM | POA: Diagnosis not present

## 2022-09-27 DIAGNOSIS — R443 Hallucinations, unspecified: Secondary | ICD-10-CM | POA: Diagnosis not present

## 2022-09-27 DIAGNOSIS — Z7984 Long term (current) use of oral hypoglycemic drugs: Secondary | ICD-10-CM | POA: Diagnosis not present

## 2022-09-27 DIAGNOSIS — R110 Nausea: Secondary | ICD-10-CM | POA: Diagnosis not present

## 2022-09-27 DIAGNOSIS — Z7951 Long term (current) use of inhaled steroids: Secondary | ICD-10-CM | POA: Diagnosis not present

## 2022-09-27 DIAGNOSIS — Z79899 Other long term (current) drug therapy: Secondary | ICD-10-CM | POA: Insufficient documentation

## 2022-09-27 DIAGNOSIS — Z79890 Hormone replacement therapy (postmenopausal): Secondary | ICD-10-CM | POA: Insufficient documentation

## 2022-09-27 DIAGNOSIS — Z7982 Long term (current) use of aspirin: Secondary | ICD-10-CM | POA: Diagnosis not present

## 2022-09-27 DIAGNOSIS — R4182 Altered mental status, unspecified: Secondary | ICD-10-CM | POA: Diagnosis not present

## 2022-09-27 DIAGNOSIS — E039 Hypothyroidism, unspecified: Secondary | ICD-10-CM | POA: Diagnosis not present

## 2022-09-27 DIAGNOSIS — J329 Chronic sinusitis, unspecified: Secondary | ICD-10-CM | POA: Diagnosis not present

## 2022-09-27 DIAGNOSIS — J324 Chronic pansinusitis: Secondary | ICD-10-CM | POA: Diagnosis not present

## 2022-09-27 DIAGNOSIS — G319 Degenerative disease of nervous system, unspecified: Secondary | ICD-10-CM | POA: Diagnosis not present

## 2022-09-27 DIAGNOSIS — R059 Cough, unspecified: Secondary | ICD-10-CM | POA: Diagnosis not present

## 2022-09-27 DIAGNOSIS — R440 Auditory hallucinations: Secondary | ICD-10-CM | POA: Diagnosis not present

## 2022-09-27 DIAGNOSIS — Z853 Personal history of malignant neoplasm of breast: Secondary | ICD-10-CM | POA: Diagnosis not present

## 2022-09-27 DIAGNOSIS — I110 Hypertensive heart disease with heart failure: Secondary | ICD-10-CM | POA: Diagnosis not present

## 2022-09-27 DIAGNOSIS — R42 Dizziness and giddiness: Secondary | ICD-10-CM | POA: Diagnosis not present

## 2022-09-27 DIAGNOSIS — I959 Hypotension, unspecified: Secondary | ICD-10-CM | POA: Diagnosis not present

## 2022-09-27 DIAGNOSIS — R0689 Other abnormalities of breathing: Secondary | ICD-10-CM | POA: Diagnosis not present

## 2022-09-27 DIAGNOSIS — J45909 Unspecified asthma, uncomplicated: Secondary | ICD-10-CM | POA: Diagnosis not present

## 2022-09-27 DIAGNOSIS — R070 Pain in throat: Secondary | ICD-10-CM | POA: Diagnosis not present

## 2022-09-27 LAB — CBC WITH DIFFERENTIAL/PLATELET
Abs Immature Granulocytes: 0.01 10*3/uL (ref 0.00–0.07)
Basophils Absolute: 0 10*3/uL (ref 0.0–0.1)
Basophils Relative: 1 %
Eosinophils Absolute: 0.2 10*3/uL (ref 0.0–0.5)
Eosinophils Relative: 3 %
HCT: 37.7 % (ref 36.0–46.0)
Hemoglobin: 13 g/dL (ref 12.0–15.0)
Immature Granulocytes: 0 %
Lymphocytes Relative: 26 %
Lymphs Abs: 1.6 10*3/uL (ref 0.7–4.0)
MCH: 30.9 pg (ref 26.0–34.0)
MCHC: 34.5 g/dL (ref 30.0–36.0)
MCV: 89.5 fL (ref 80.0–100.0)
Monocytes Absolute: 0.6 10*3/uL (ref 0.1–1.0)
Monocytes Relative: 9 %
Neutro Abs: 3.8 10*3/uL (ref 1.7–7.7)
Neutrophils Relative %: 61 %
Platelets: 238 10*3/uL (ref 150–400)
RBC: 4.21 MIL/uL (ref 3.87–5.11)
RDW: 13.5 % (ref 11.5–15.5)
WBC: 6.2 10*3/uL (ref 4.0–10.5)
nRBC: 0 % (ref 0.0–0.2)

## 2022-09-27 LAB — COMPREHENSIVE METABOLIC PANEL
ALT: 19 U/L (ref 0–44)
AST: 27 U/L (ref 15–41)
Albumin: 3.3 g/dL — ABNORMAL LOW (ref 3.5–5.0)
Alkaline Phosphatase: 42 U/L (ref 38–126)
Anion gap: 8 (ref 5–15)
BUN: 8 mg/dL (ref 8–23)
CO2: 24 mmol/L (ref 22–32)
Calcium: 8.4 mg/dL — ABNORMAL LOW (ref 8.9–10.3)
Chloride: 103 mmol/L (ref 98–111)
Creatinine, Ser: 0.57 mg/dL (ref 0.44–1.00)
GFR, Estimated: >60 mL/min (ref >60)
Glucose, Bld: 95 mg/dL (ref 70–99)
Potassium: 3.8 mmol/L (ref 3.5–5.1)
Sodium: 135 mmol/L (ref 135–145)
Total Bilirubin: 1 mg/dL (ref 0.3–1.2)
Total Protein: 5.5 g/dL — ABNORMAL LOW (ref 6.5–8.1)

## 2022-09-27 LAB — URINALYSIS, ROUTINE W REFLEX MICROSCOPIC
Bacteria, UA: NONE SEEN
Bilirubin Urine: NEGATIVE
Glucose, UA: >=500 mg/dL — AB
Hgb urine dipstick: NEGATIVE
Ketones, ur: NEGATIVE mg/dL
Leukocytes,Ua: NEGATIVE
Nitrite: NEGATIVE
Protein, ur: NEGATIVE mg/dL
Specific Gravity, Urine: 1.003 — ABNORMAL LOW (ref 1.005–1.030)
pH: 8 (ref 5.0–8.0)

## 2022-09-27 LAB — RESP PANEL BY RT-PCR (FLU A&B, COVID) ARPGX2
Influenza A by PCR: NEGATIVE
Influenza B by PCR: NEGATIVE
SARS Coronavirus 2 by RT PCR: NEGATIVE

## 2022-09-27 LAB — LACTIC ACID, PLASMA: Lactic Acid, Venous: 1.1 mmol/L (ref 0.5–1.9)

## 2022-09-27 LAB — MAGNESIUM: Magnesium: 2 mg/dL (ref 1.7–2.4)

## 2022-09-27 LAB — CBG MONITORING, ED: Glucose-Capillary: 89 mg/dL (ref 70–99)

## 2022-09-27 LAB — TROPONIN I (HIGH SENSITIVITY)
Troponin I (High Sensitivity): 12 ng/L (ref <18)
Troponin I (High Sensitivity): 14 ng/L (ref <18)

## 2022-09-27 MED ORDER — LACTATED RINGERS IV BOLUS
500.0000 mL | Freq: Once | INTRAVENOUS | Status: AC
  Administered 2022-09-27: 500 mL via INTRAVENOUS

## 2022-09-27 MED ORDER — DOXYCYCLINE HYCLATE 100 MG PO CAPS: 100.0000 mg | ORAL_CAPSULE | Freq: Two times a day (BID) | ORAL | 0 refills | Status: DC

## 2022-09-27 NOTE — ED Provider Notes (Signed)
[facility] EMERGENCY DEPARTMENT Provider Note   CSN: [id number] Arrival date & time: 09/27/22  4034     History {Add pertinent medical, surgical, social history, OB history to HPI:1} Chief Complaint  Patient presents with   Weakness    KELE WITHEM is a 81 y.o. female with CAD, history DVT, chronic diastolic heart failure, MR, HTN, chronic venous insufficiency, asthma, hypothyroidism, HLD, diverticulosis, GERD, aortic stenosis, history of breast cancer presents with generalized weakness.   Patient presents with her daughter who provides additional history.  Patient reports generalized weakness and dizziness for a week. No room spinning around her or vertigo symptoms, just "dizziness."  Endorses feeling the sensation of her tongue and throat being swollen for several weeks, and it is also very dry. Notes that she started [place] about a week ago. Has also been "hearing a song play" mostly in her R ear. It's not a high pitched tone, clicking, or muffled sounds, it's an actual song playing that isn't really playing. Went to ENT and was evaluated who thought she might be having auditory hallucinations.  ***   Weakness      Home Medications Prior to Admission medications   Medication Sig Start Date End Date Taking? Authorizing Provider  aspirin 81 MG tablet Take 81 mg by mouth daily.      [provider]  atorvastatin (LIPITOR) 20 MG tablet TAKE 1 TABLET BY MOUTH EVERY DAY 09/13/22   Burns, Claudina Lick, MD  Calcium Carbonate (CALTRATE 600 PO) Take 1 tablet by mouth 2 (two) times daily.      [provider]  citalopram (CELEXA) 10 MG tablet Take 1 tablet (10 mg total) by mouth daily. 09/17/22   [name], MD  Coenzyme Q10 (CO Q-10 PO) Take 50 mg by mouth daily.      [provider]  empagliflozin (JARDIANCE) 10 MG TABS tablet Take 1 tablet (10 mg total) by mouth daily before breakfast. 08/23/22   [name], MD  famotidine (PEPCID)  40 MG tablet Take 1 tablet (40 mg total) by mouth daily. 01/01/22   Burns, Claudina Lick, MD  fluticasone (FLONASE) 50 MCG/ACT nasal spray START ONE WEEK AFTER SURGERY Spray 1 spray(s) EVERY DAY by intranasal route for one month    [provider]  furosemide (LASIX) 20 MG tablet Take 1 tablet (20 mg total) by mouth daily. 07/22/22   Nahser, Wonda Cheng, MD  levothyroxine (SYNTHROID) 75 MCG tablet TAKE 1 TABLET EVERY MORNING BEFORE BREAKFAST 09/06/22   [name], MD  losartan (COZAAR) 50 MG tablet Take 1 tablet (50 mg total) by mouth daily. 12/30/21   [name], MD  meloxicam (MOBIC) 7.5 MG tablet TAKE 1/2 TO 1 TABLET EVERY DAY 03/29/22   [name], MD  metoprolol tartrate (LOPRESSOR) 25 MG tablet Take 1 tablet (25 mg total) by mouth 2 (two) times daily. 08/10/22   [name], MD  MULTIPLE VITAMIN PO Take 1 tablet by mouth daily.    [provider]  Multiple Vitamins-Minerals (MULTIVITAMIN) LIQD  11/05/21   [provider]  senna-docusate (SENOKOT-S) 8.6-50 MG per tablet Take 1 tablet by mouth daily.      [provider]  VITAMIN D, CHOLECALCIFEROL, PO Take by mouth.    [provider]  zolpidem (AMBIEN) 10 MG tablet Take 0.5 tablets (5 mg total) by mouth at bedtime. 08/04/22   [name], MD      Allergies    Clarithromycin,  Clindamycin, Codeine, Levofloxacin, Morphine, Naproxen, Omeprazole, Penicillins, and Pneumococcal vaccines    Review of Systems   Review of Systems  Neurological:  Positive for weakness.   Review of systems positive/negative for ***.  A 10 point review of systems was performed and is negative unless otherwise reported in HPI.  Physical Exam Updated Vital Signs BP (!) 156/113 (BP Location: Right Arm)   Pulse 76   Temp (!) 97.5 F (36.4 C) (Oral)   Resp (!) 22   Ht '5\' 3"'$  (1.6 m)   Wt 54.9 kg   SpO2 100%   BMI 21.43 kg/m  Physical Exam General: Normal appearing ***female/female, lying in bed.  HEENT: PERRLA,  Sclera anicteric, MMM, trachea midline. Cardiology: RRR, no murmurs/rubs/gallops. BL radial and DP pulses equal bilaterally.  Resp: Normal respiratory rate and effort. CTAB, no wheezes, rhonchi, crackles.  Abd: Soft, non-tender, non-distended. No rebound tenderness or guarding.  GU: Deferred. MSK: No peripheral edema or signs of trauma. Extremities without deformity or TTP. No cyanosis or clubbing. Skin: warm, dry. No rashes or lesions. Back: No CVA tenderness Neuro: A&Ox4, CNs II-XII grossly intact. MAEs. Sensation grossly intact.  Psych: Normal mood and affect.   ED Results / Procedures / Treatments   Labs (all labs ordered are listed, but only abnormal results are displayed) Labs Reviewed - No data to display  EKG None  Radiology No results found.  Procedures Procedures  {Document cardiac monitor, telemetry assessment procedure when appropriate:1}  Medications Ordered in ED Medications - No data to display  ED Course/ Medical Decision Making/ A&P                          Medical Decision Making Amount and/or Complexity of Data Reviewed Labs: ordered. Decision-making details documented in ED Course. Radiology: ordered. Decision-making details documented in ED Course.  Risk Prescription drug management.    '@HNNMDM'$ @ ***  Labs demonstrate ***.   I have personally reviewed and interpreted all labs and imaging.   Clinical Course as of 09/27/22 1623  Mon Sep 27, 2022  1047 Urinalysis, Routine w reflex microscopic(!) No UTI [HN]  1047 WBC: 6.2 No leukocytosis [HN]  1048 CT Head Wo Contrast 1. No acute intracranial abnormalities. 2. Chronic paranasal sinus disease, as above. [HN]  1118 Lactic Acid, Venous: 1.1 [HN]  1119 Troponin I (High Sensitivity): 12 [HN]  1119 Magnesium: 2.0 [HN]    Clinical Course User Index [HN] Audley Hose, MD    {Document critical care time when appropriate:1} {Document review of labs and clinical decision tools ie heart  score, Chads2Vasc2 etc:1}  {Document your independent review of radiology images, and any outside records:1} {Document your discussion with family members, caretakers, and with consultants:1} {Document social determinants of health affecting pt's care:1} {Document your decision making why or why not admission, treatments were needed:1} Final Clinical Impression(s) / ED Diagnoses Final diagnoses:  None    Rx / DC Orders ED Discharge Orders     None        This note was created using dictation software, which may contain spelling or grammatical errors.

## 2022-09-27 NOTE — Discharge Instructions (Addendum)
Thank you for coming to [facility] Emergency Department. You were seen for generalized weakness, auditory hallucinations. We did an exam, labs, and imaging, and these showed chronic sinusitis. We will prescribe doxycycline 100 mg to take twice per day for 10 days for sinusitis, this may be contributing to your symptoms.  Please follow up with your primary care provider as scheduled for tomorrow. We have also referred you to [facility]rology for follow up, please make an appointment with them in the next couple of weeks.  Do not hesitate to return to the ED or call 911 if you experience: -Worsening symptoms -Confusion, slurred speech, asymmetric weakness -Falls, head trauma -Lightheadedness, passing out -Fevers/chills -Anything else that concerns you

## 2022-09-27 NOTE — Progress Notes (Unsigned)
    Subjective:    Patient ID: [name], female    DOB: Feb 02, 1941, 81 y.o.   MRN: [id number]      HPI [name] is here for No chief complaint on file.    Hallucinations - she recently saw ENT.  Went to ED yesterday -  for AMS and hallucinations.w/u neg.  Ct head showed mild chronic microvascular disease     Medications and allergies reviewed with patient and updated if appropriate.  Current Outpatient Medications on File Prior to Visit  Medication Sig Dispense Refill   aspirin 81 MG tablet Take 81 mg by mouth daily.       atorvastatin (LIPITOR) 20 MG tablet TAKE 1 TABLET BY MOUTH EVERY DAY 90 tablet 1   Calcium Carbonate (CALTRATE 600 PO) Take 1 tablet by mouth 2 (two) times daily.       citalopram (CELEXA) 10 MG tablet Take 1 tablet (10 mg total) by mouth daily. 90 tablet 2   Coenzyme Q10 (CO Q-10 PO) Take 50 mg by mouth daily.       doxycycline (VIBRAMYCIN) 100 MG capsule Take 1 capsule (100 mg total) by mouth 2 (two) times daily. 20 capsule 0   empagliflozin (JARDIANCE) 10 MG TABS tablet Take 1 tablet (10 mg total) by mouth daily before breakfast. 30 tablet 11   famotidine (PEPCID) 40 MG tablet Take 1 tablet (40 mg total) by mouth daily. 90 tablet 1   fluticasone (FLONASE) 50 MCG/ACT nasal spray START ONE WEEK AFTER SURGERY Spray 1 spray(s) EVERY DAY by intranasal route for one month     furosemide (LASIX) 20 MG tablet Take 1 tablet (20 mg total) by mouth daily. 90 tablet 3   levothyroxine (SYNTHROID) 75 MCG tablet TAKE 1 TABLET EVERY MORNING BEFORE BREAKFAST 90 tablet 1   losartan (COZAAR) 50 MG tablet Take 1 tablet (50 mg total) by mouth daily. 90 tablet 3   meloxicam (MOBIC) 7.5 MG tablet TAKE 1/2 TO 1 TABLET EVERY DAY 90 tablet 0   metoprolol tartrate (LOPRESSOR) 25 MG tablet Take 1 tablet (25 mg total) by mouth 2 (two) times daily. 180 tablet 1   MULTIPLE VITAMIN PO Take 1 tablet by mouth daily.     Multiple Vitamins-Minerals (MULTIVITAMIN) LIQD      senna-docusate  (SENOKOT-S) 8.6-50 MG per tablet Take 1 tablet by mouth daily.       VITAMIN D, CHOLECALCIFEROL, PO Take by mouth.     zolpidem (AMBIEN) 10 MG tablet Take 0.5 tablets (5 mg total) by mouth at bedtime. 15 tablet 5   No current facility-administered medications on file prior to visit.    Review of Systems     Objective:  There were no vitals filed for this visit. BP Readings from Last 3 Encounters:  09/27/22 (!) 177/78  08/23/22 130/80  08/04/22 128/82   Wt Readings from Last 3 Encounters:  09/27/22 121 lb (54.9 kg)  08/23/22 130 lb 6.4 oz (59.1 kg)  08/04/22 130 lb (59 kg)   There is no height or weight on file to calculate BMI.    Physical Exam         Assessment & Plan:    See Problem List for Assessment and Plan of chronic medical problems.

## 2022-09-27 NOTE — ED Triage Notes (Signed)
PT BIB [facility] for generalized weakness and dizzyness x 1 week and also a sore throat. Also complains of abnormal sleep.  EMS endorses PAC's on monitor.  Pt took her synthroid this AM.   EMS Vitals 173/75 100% HR: 70 CBG 126  20G LAC

## 2022-09-27 NOTE — ED Notes (Signed)
DC instructions reviewed with pt. PT verbalized understanding.  PT Dc.  

## 2022-09-28 ENCOUNTER — Ambulatory Visit (INDEPENDENT_AMBULATORY_CARE_PROVIDER_SITE_OTHER): Payer: Medicare Other | Admitting: Internal Medicine

## 2022-09-28 VITALS — BP 130/80 | HR 75 | Temp 98.0°F | Ht 63.0 in | Wt 123.4 lb

## 2022-09-28 DIAGNOSIS — I25119 Atherosclerotic heart disease of native coronary artery with unspecified angina pectoris: Secondary | ICD-10-CM

## 2022-09-28 DIAGNOSIS — G4709 Other insomnia: Secondary | ICD-10-CM | POA: Diagnosis not present

## 2022-09-28 DIAGNOSIS — I10 Essential (primary) hypertension: Secondary | ICD-10-CM

## 2022-09-28 DIAGNOSIS — R440 Auditory hallucinations: Secondary | ICD-10-CM | POA: Diagnosis not present

## 2022-09-28 MED ORDER — QUETIAPINE FUMARATE 25 MG PO TABS: 25.0000 mg | ORAL_TABLET | Freq: Every day | ORAL | 2 refills | Status: DC

## 2022-09-28 NOTE — Assessment & Plan Note (Signed)
Chronic Blood pressure is controlled after rechecking it here Continue losartan 50 mg daily, metoprolol 25 mg twice daily

## 2022-09-28 NOTE — Patient Instructions (Addendum)
     Medications changes include :   hold the ambien for now.  Start seroquel 25 mg nightly.      A referral was ordered for [facility] and for a brain MRI.     Someone will call you to schedule an appointment.    Return for 2-3 weeks follow up.

## 2022-09-28 NOTE — Assessment & Plan Note (Signed)
First had auditory hallucinations in March 2023-it started after starting sertraline and she thought it was related to that.  We did switch her SSRI to sertraline during a very stressful time-at that time she was also not sleeping or eating   After stopping the sertraline the hallucinations seem to have resolved Recently started experiencing them again-hears it out of the right ear only Saw ENT CT scan yesterday in the ED without concerning findings Her and her daughter both deny any memory issues, but needs to be officially tested  Will order MRI Blood work all normal couple of months ago so will not repeat at this time  Given the significant anxiety associated with decreased appetite, with insomnia we need to make medication changes-she is reluctant to change the citalopram We will stop Ambien and start Seroquel 25 mg at night-we will titrate as tolerated Follow-up in 2-3 weeks

## 2022-09-28 NOTE — Assessment & Plan Note (Signed)
Chronic Has been taking 5 mg of Ambien for years-currently not working which is likely related to increased stress and anxiety related to these auditory hallucinations We will hold Ambien for now Start Seroquel 25 mg daily-might need to titrate slightly

## 2022-09-29 ENCOUNTER — Encounter: Payer: Self-pay | Admitting: Neurology

## 2022-09-30 ENCOUNTER — Telehealth: Payer: Self-pay | Admitting: *Deleted

## 2022-09-30 ENCOUNTER — Encounter: Payer: Self-pay | Admitting: *Deleted

## 2022-09-30 NOTE — Patient Outreach (Signed)
  Care Coordination [facility] Note Transition Care Management Follow-up Telephone Call Date of discharge and from where: Monday 09/27/22 ED Visit- [name]; general weakness; dizziness; auditory hallucinations; EMMI Red Alert: "No discharge instructions; no follow up appointment" How have you been since you were released from the hospital? "I am doing great.  Not having any problems.  I saw Dr. Quay Burow the day after my ER visit and I will be seeing her again soon.  I don't have any questions."   Any questions or concerns? No  Items Reviewed: Did the pt receive and understand the discharge instructions provided?  Patient unsure, denies questions/ concerns post- ED visit and confirms has attended post- ED visit PCP appointment on 09/28/22 Medications obtained and verified? No - patient declined Other? No  Any new allergies since your discharge? No  Dietary orders reviewed? No- patient declined Do you have support at home? Yes   Home Care and Equipment/Supplies: Were home health services ordered? not applicable If so, what is the name of the agency? N/A  Has the agency set up a time to come to the patient's home? not applicable Were any new equipment or medical supplies ordered?  No What is the name of the medical supply agency? N/A Were you able to get the supplies/equipment? not applicable Do you have any questions related to the use of the equipment or supplies? No N/A  Functional Questionnaire: (I = Independent and D = Dependent) ADLs: I  Bathing/Dressing- I  Meal Prep- I  Eating- I  Maintaining continence- I  Transferring/Ambulation- I  Managing Meds- I  Follow up appointments reviewed:  PCP Hospital f/u appt confirmed? Yes  Confirmed patient attended post- ED PCP office visit on Tuesday 09/28/22 Specialist Hospital f/u appt confirmed? No  Scheduled to see - on - @ - Are transportation arrangements needed? No  If their condition worsens, is the pt aware to call PCP or go to the  Emergency Dept.? Yes Was the patient provided with contact information for the PCP's office or ED? Yes Was to pt encouraged to call back with questions or concerns? Yes  SDOH assessments and interventions completed:   No- patient declined assessment questions  Care Coordination Interventions Activated:  No   Care Coordination Interventions:  No Care Coordination interventions needed at this time.   Encounter Outcome:  Pt. Visit Completed    Oneta Rack, RN, BSN, CCRN Alumnus RN CM Care Coordination/ Transition of [place] Management [phone]: direct office

## 2022-10-03 ENCOUNTER — Other Ambulatory Visit: Payer: Self-pay | Admitting: Internal Medicine

## 2022-10-04 ENCOUNTER — Telehealth: Payer: Self-pay | Admitting: *Deleted

## 2022-10-04 NOTE — Telephone Encounter (Signed)
      Patient  visit on 09/27/2022  at [facility]  was for weakness   Have you been able to follow up with your primary care physician? Doing well saw Pcp the day after the ed visit , had a previous call about said ed visit . Needs no other assistance at this time  The patient was able to obtain any needed medicine or equipment.  Are there diet recommendations that you are having difficulty following?  Patient expresses understanding of discharge instructions and education provided has no other needs at this time.    [place] [phone] [address] , [place] [address] Email : [email or web address]

## 2022-10-05 ENCOUNTER — Telehealth: Payer: Self-pay

## 2022-10-05 NOTE — Telephone Encounter (Signed)
Pt has billing questions/concerns regarding diagnostic imaging that was done in 04/2022. Will send msg to clinical supervisor/front office supervisor.

## 2022-10-06 ENCOUNTER — Telehealth: Payer: Self-pay | Admitting: *Deleted

## 2022-10-06 NOTE — Progress Notes (Signed)
  Care Coordination Note  10/06/2022 Name: [name] MRN: [id number] DOB: 01/24/1941  [name] is a 81 y.o. year old female who is a primary care patient of [name], MD and is actively engaged with the care management team. I reached out to Margretta Ditty by phone today to assist with re-scheduling a follow up visit with the RN Case Manager  Follow up plan: Patient declines further follow up and engagement by the care management team. Appropriate care team members and provider have been notified via electronic communication.   Julian Hy, [place] Direct Dial: [phone]

## 2022-10-11 NOTE — Progress Notes (Unsigned)
Subjective:    Patient ID: [name], female    DOB: [birth year], 81 y.o.   MRN: [id number]     HPI [name] is here for follow up of her chronic medical problems, including auditory hallucinations, insomnia, anxiety  MRI of brain - 12/11 Dr Delice Lesch - 12/14  Auditory hallucinations stop when she is talking to someone or is on the phone.  She hears it off to the right and only hears it in the right ear.  It occurs w/ or w/o her hearing aid.   2 weeks ago - stopped [name] ( stopped working) and started seroquel 25 mg nightly.  She does feel groggy in the morning and has had some bad dreams but the new medication.  Has frequent nocturia.   Has incontinence for a while.  She wears depends nightly - she has to change them 3/ night.  She drinks water all day.    Appetite is better.  Anxiety is still high.    Medications and allergies reviewed with patient and updated if appropriate.  Current Outpatient Medications on File Prior to Visit  Medication Sig Dispense Refill   aspirin 81 MG tablet Take 81 mg by mouth daily.       atorvastatin (LIPITOR) 20 MG tablet TAKE 1 TABLET BY MOUTH EVERY DAY 90 tablet 1   Calcium Carbonate (CALTRATE 600 PO) Take 1 tablet by mouth 2 (two) times daily.       Coenzyme Q10 (CO Q-10 PO) Take 50 mg by mouth daily.       empagliflozin (JARDIANCE) 10 MG TABS tablet Take 1 tablet (10 mg total) by mouth daily before breakfast. 30 tablet 11   famotidine (PEPCID) 40 MG tablet TAKE 1 TABLET BY MOUTH EVERY DAY 90 tablet 1   fluticasone (FLONASE) 50 MCG/ACT nasal spray START ONE WEEK AFTER SURGERY Spray 1 spray(s) EVERY DAY by intranasal route for one month     furosemide (LASIX) 20 MG tablet Take 1 tablet (20 mg total) by mouth daily. 90 tablet 3   levothyroxine (SYNTHROID) 75 MCG tablet TAKE 1 TABLET EVERY MORNING BEFORE BREAKFAST 90 tablet 1   losartan (COZAAR) 50 MG tablet Take 1 tablet (50 mg total) by mouth daily. 90 tablet 3   meloxicam (MOBIC) 7.5 MG  tablet TAKE 1/2 TO 1 TABLET EVERY DAY 90 tablet 0   metoprolol tartrate (LOPRESSOR) 25 MG tablet Take 1 tablet (25 mg total) by mouth 2 (two) times daily. 180 tablet 1   MULTIPLE VITAMIN PO Take 1 tablet by mouth daily.     Multiple Vitamins-Minerals (MULTIVITAMIN) LIQD      QUEtiapine (SEROQUEL) 25 MG tablet Take 1 tablet (25 mg total) by mouth at bedtime. 30 tablet 2   senna-docusate (SENOKOT-S) 8.6-50 MG per tablet Take 1 tablet by mouth daily.       VITAMIN D, CHOLECALCIFEROL, PO Take by mouth.     No current facility-administered medications on file prior to visit.     Review of Systems     Objective:   Vitals:   10/12/22 1417  BP: 130/72  Pulse: 64  Temp: 97.9 F (36.6 C)  SpO2: 97%   BP Readings from Last 3 Encounters:  10/12/22 130/72  09/28/22 130/80  09/27/22 (!) 177/78   Wt Readings from Last 3 Encounters:  10/12/22 123 lb (55.8 kg)  09/28/22 123 lb 6.4 oz (56 kg)  09/27/22 121 lb (54.9 kg)   Body mass index is 21.79 kg/m.  Physical Exam Constitutional:      Appearance: Normal appearance.  HENT:     Head: Normocephalic and atraumatic.  Skin:    General: Skin is warm and dry.  Neurological:     Mental Status: She is alert. Mental status is at baseline.  Psychiatric:        Mood and Affect: Mood normal.        Behavior: Behavior normal.        Lab Results  Component Value Date   WBC 6.2 09/27/2022   HGB 13.0 09/27/2022   HCT 37.7 09/27/2022   PLT 238 09/27/2022   GLUCOSE 95 09/27/2022   CHOL 131 08/04/2022   TRIG 79.0 08/04/2022   HDL 53.60 08/04/2022   LDLCALC 61 08/04/2022   ALT 19 09/27/2022   AST 27 09/27/2022   NA 135 09/27/2022   K 3.8 09/27/2022   CL 103 09/27/2022   CREATININE 0.57 09/27/2022   BUN 8 09/27/2022   CO2 24 09/27/2022   TSH 1.47 08/04/2022   INR 1.0 ratio 10/26/2010   HGBA1C 5.0 08/04/2022     Assessment & Plan:    See Problem List for Assessment and Plan of chronic medical problems.

## 2022-10-12 ENCOUNTER — Encounter: Payer: Self-pay | Admitting: Internal Medicine

## 2022-10-12 ENCOUNTER — Ambulatory Visit (INDEPENDENT_AMBULATORY_CARE_PROVIDER_SITE_OTHER): Payer: Medicare Other | Admitting: Internal Medicine

## 2022-10-12 VITALS — BP 130/72 | HR 64 | Temp 97.9°F | Ht 63.0 in | Wt 123.0 lb

## 2022-10-12 DIAGNOSIS — R440 Auditory hallucinations: Secondary | ICD-10-CM | POA: Diagnosis not present

## 2022-10-12 DIAGNOSIS — F419 Anxiety disorder, unspecified: Secondary | ICD-10-CM

## 2022-10-12 DIAGNOSIS — G4709 Other insomnia: Secondary | ICD-10-CM

## 2022-10-12 DIAGNOSIS — I25119 Atherosclerotic heart disease of native coronary artery with unspecified angina pectoris: Secondary | ICD-10-CM

## 2022-10-12 DIAGNOSIS — F32A Depression, unspecified: Secondary | ICD-10-CM | POA: Diagnosis not present

## 2022-10-12 MED ORDER — ZOLPIDEM TARTRATE 5 MG PO TABS: 5.0000 mg | ORAL_TABLET | Freq: Every evening | ORAL | 2 refills | Status: DC | PRN

## 2022-10-12 MED ORDER — CITALOPRAM HYDROBROMIDE 20 MG PO TABS: 20.0000 mg | ORAL_TABLET | Freq: Every day | ORAL | 5 refills | Status: DC

## 2022-10-12 MED ORDER — FLUTICASONE PROPIONATE 50 MCG/ACT NA SUSP: 1.0000 | Freq: Every day | NASAL | 5 refills | Status: AC | PRN

## 2022-10-12 NOTE — Assessment & Plan Note (Signed)
Persistent Only hears it in the right ear and it feels like it is off to her right.  Does not hear it when she is talking to someone more on the phone.  Can drown it out if the TV is very loud Seroquel did not help This is causing a lot of stress CT scan of the head was without acute abnormality MRI scheduled for 12/11 To see Dr. Delice Lesch 12/14

## 2022-10-12 NOTE — Assessment & Plan Note (Signed)
Chronic Has been on Ambien 5 mg nightly for years and has tolerated it well and it has always been effective-2 weeks ago she came in it was not effective-likely related to significantly increased anxiety At that time tried switching to Seroquel 25 mg nightly, but she is not tolerating that well so we will discontinue it and try to resume the Ambien 5 mg nightly which she has done well with Hopefully increasing the citalopram will also help She will update me in the next couple of weeks on how she is doing

## 2022-10-12 NOTE — Patient Instructions (Addendum)
   Try drinking 40 oz of water a day.     Medications changes include :      stop seroquel.    Restart ambien at night - 5 mg.    Increase citalopram (celexa) to 20 mg daily       Return for follow up as scheduled.

## 2022-10-12 NOTE — Assessment & Plan Note (Signed)
Chronic Anxiety is not ideally controlled Will try increasing citalopram to 20 mg daily

## 2022-10-22 ENCOUNTER — Telehealth: Payer: Self-pay | Admitting: Internal Medicine

## 2022-10-22 NOTE — Telephone Encounter (Signed)
Pt sates she still has chest congestion, and isn't feeling any better from her visit on 11.28.23.  Pt also states that the antibiotics aren't working.   Pt I requesting we send a stronger antibiotic RX and cough syrup be sent to [id number] Phone: [phone] Fax: [phone]  Pt did not know the name of the antibiotics she was taking, and I couldn't find any recent antibiotic RX's.

## 2022-10-22 NOTE — Telephone Encounter (Signed)
I have not prescribed her any antibiotics in the past couple of months

## 2022-10-25 ENCOUNTER — Ambulatory Visit
Admission: RE | Admit: 2022-10-25 | Discharge: 2022-10-25 | Disposition: A | Discharge status: Home / Self Care | Payer: Medicare Other | Source: Ambulatory Visit | Attending: Internal Medicine | Admitting: Internal Medicine

## 2022-10-25 DIAGNOSIS — R440 Auditory hallucinations: Secondary | ICD-10-CM

## 2022-10-25 DIAGNOSIS — G319 Degenerative disease of nervous system, unspecified: Secondary | ICD-10-CM | POA: Diagnosis not present

## 2022-10-25 DIAGNOSIS — J329 Chronic sinusitis, unspecified: Secondary | ICD-10-CM | POA: Diagnosis not present

## 2022-10-25 DIAGNOSIS — I6782 Cerebral ischemia: Secondary | ICD-10-CM | POA: Diagnosis not present

## 2022-10-25 NOTE — Telephone Encounter (Signed)
Message left for patient that she will need to be seen.  If she calls back please schedule OV.

## 2022-10-27 ENCOUNTER — Ambulatory Visit (INDEPENDENT_AMBULATORY_CARE_PROVIDER_SITE_OTHER): Payer: Medicare Other | Admitting: Podiatry

## 2022-10-27 ENCOUNTER — Encounter: Payer: Self-pay | Admitting: Podiatry

## 2022-10-27 ENCOUNTER — Encounter: Payer: Self-pay | Admitting: Internal Medicine

## 2022-10-27 VITALS — BP 121/61

## 2022-10-27 DIAGNOSIS — B351 Tinea unguium: Secondary | ICD-10-CM | POA: Diagnosis not present

## 2022-10-27 DIAGNOSIS — I25119 Atherosclerotic heart disease of native coronary artery with unspecified angina pectoris: Secondary | ICD-10-CM

## 2022-10-27 DIAGNOSIS — M21619 Bunion of unspecified foot: Secondary | ICD-10-CM

## 2022-10-27 DIAGNOSIS — Z8673 Personal history of transient ischemic attack (TIA), and cerebral infarction without residual deficits: Secondary | ICD-10-CM | POA: Insufficient documentation

## 2022-10-28 ENCOUNTER — Encounter: Payer: Self-pay | Admitting: Neurology

## 2022-10-28 ENCOUNTER — Ambulatory Visit (INDEPENDENT_AMBULATORY_CARE_PROVIDER_SITE_OTHER): Payer: Medicare Other | Admitting: Neurology

## 2022-10-28 ENCOUNTER — Other Ambulatory Visit: Payer: Medicare Other

## 2022-10-28 VITALS — BP 157/71 | HR 56 | Ht 63.0 in | Wt 122.8 lb

## 2022-10-28 DIAGNOSIS — I25119 Atherosclerotic heart disease of native coronary artery with unspecified angina pectoris: Secondary | ICD-10-CM

## 2022-10-28 DIAGNOSIS — R440 Auditory hallucinations: Secondary | ICD-10-CM | POA: Diagnosis not present

## 2022-10-28 NOTE — Patient Instructions (Signed)
Good to meet you.  Have bloodwork done for paraneoplastic panel  2. Schedule 1-hour EEG. If normal, we will do a 2-day home EEG  3. We may do Neurocognitive testing later on  4. Follow-up after tests, call for any changes

## 2022-10-28 NOTE — Progress Notes (Signed)
Subjective:   Patient ID: [name], female   DOB: 81 y.o.   MRN: [id number]   HPI Patient presents stating the right big toenail has been discolored and concerned about this stating that it has been around 3 months does not remember injury   ROS      Objective:  Physical Exam  Neurovascular status intact with patient found to have nail discoloration of the big toenail right localized to the distal two thirds with no erythema edema drainage     Assessment:  Probability for some degree of trauma with a fungal nail deformity right hallux     Plan:  Reviewed with patient I do think it may grow out and I have recommended watching it and we will see whether or not it grows out properly.  If it does not we will consider laser and possible pulse antifungal oral therapy

## 2022-10-28 NOTE — Progress Notes (Signed)
NEUROLOGY CONSULTATION NOTE  [name] MRN: [id number] DOB: Jan 23, 1941  Referring provider: Dr. Billey Mullen Primary care provider: Dr. Billey Mullen  Reason for consult:  auditory hallucinations  Dear [name]:  Thank you for your kind referral of [name] for consultation of the above symptoms. Although her history is well known to you, please allow me to reiterate it for the purpose of our medical record. The patient was accompanied to the clinic by her daughter [name] who also provides collateral information. Records and images were personally reviewed where available.   HISTORY OF PRESENT ILLNESS: This is a very pleasant 81 year old right-handed woman with a history of hypertension, hyperlipidemia, hypothyroidism, CAD s/p CABG, moderate aortic stenosis, presenting for evaluation of auditory hallucinations. Review of records on EPIC done on visits with Cardiology, ENT, PCP. She was evaluated by ENT [name] on 09/22/22 with note that "about 10 days ago, she received news that her heart was not functioning well and got upset." She started Jardiance on 10/16. Per notes, " a couple of days later, she was humming a hymn but then heard it continue after she stopped humming." She was sitting then all of a sudden heard the same hymn being sung right back to her on the right side. This lasted several minutes with no associated headache, dizziness, focal symptoms. Since then, the auditory hallucinations continued. She notes that they are different songs, church hymns, old hymns, now Christmas carols, sung by a different man or woman, or sometimes together. It would be the same line repeatedly for 30 minutes ("go tell it to the mountain"). Interestingly, the hymns stop when she is talking to someone, on the phone, or when she turns the music louder. She mostly notices it at home, but has heard it in her car. It stops when she turns the radio up. When she goes to another room where the TV is not  audible, the hymns would resume. It appears she mostly notices it when she is alone. If she were with someone, even if they are not speaking, she does not hear the hymns. It stops before she goes to sleep and does not wake her out of sleep. She mostly hears it on the right ear. Aside from the hymns, she does not hear any conversations, no visual component. Of note, Angelica. Quay Mullen reported that in March when she had a very stressful situation with increased anxiety, loss of appetite and insomnia, she was switched to SErtraline and had similar auditory hallucinations that seemed to stop after stopping Sertraline.   [name] denies any staring/unresponsive episodes. She denies any loss of time, olfactory/gustatory hallucinations, rising epigastric sensation, focal numbness/tingling/weakness, myoclonic jerks. No headaches, dizziness, diplopia, dysarthria/dysphagia, neck/back pain, bowel/bladder dysfunction. She had an unintentional 10-lb weight loss in a month, no appetite. She can tell a change in her memory, now she depends on her phone on how to spell a word or do math. [name] denies any memory concerns. She lives alone. She denies missing medications, [name] reports she is really good, with a good system at home. All her bills are on autopay. She denies getting lost driving, but can tell she is not as alert. She does not cook, no difficulties operating the microwave. No family history of dementia. She has shaking when nervous. She feels the increase in citalopram to '20mg'$  has calmed her down. She was having panic attacks, there was a lot going on at the beginning of the year, "little traumatic baby  steps," but [name] has noticed a definite improvement in mood, there has been an upswing. She does not sleep well, anxiety increased since the hallucinations started, she was switched from Ambien to Seroquel '25mg'$  which made her feel drunk and had bad dreams, with no change in sleep or in the hallucinations. She is back on prn  Ambien '5mg'$  getting 5-6 hours of sleep. She used to get 10 hours of sleep. [name] notes they are working on getting new hearing aids. She had a normal birth and early development.  There is no history of febrile convulsions, CNS infections such as meningitis/encephalitis, significant traumatic brain injury, neurosurgical procedures, or family history of seizures.   I personally reviewed MRI brain without contrast done 10/26/22 with no acute changes, there was mild to moderate diffuse atrophy, mild chronic microvascular disease. There was complete opacification of the left frontal sinus and incompletely assessed cervical spondylosis with multilevel grade 1 spondylolisthesis.     PAST MEDICAL HISTORY: Past Medical History:  Diagnosis Date   ALLERGIC RHINITIS    Allergy    SEASONAL   Anxiety    Aortic stenosis, moderate    Blood transfusion without reported diagnosis    BREAST CANCER 1991   left, 1991; B mastectomy   Cataract    BILATERAL-REMOVED   Coronary artery disease    s/p CABGx5 2007 //Nuclear stress test 10/18: EF 76, no ischemia or infarction, low risk   Depression    DIVERTICULOSIS    DVT    EROSIVE GASTRITIS    GERD    Heart murmur    History of DVT (deep vein thrombosis)    HYPERLIPIDEMIA    Hypertension    HYPOTHYROIDISM    Occlusion and stenosis of carotid artery without mention of cerebral infarction    Osteopenia    OSTEOPOROSIS    RHINOSINUSITIS, RECURRENT     PAST SURGICAL HISTORY: Past Surgical History:  Procedure Laterality Date   APPENDECTOMY     COLONOSCOPY     CORONARY ARTERY BYPASS GRAFT     5        2007   MASTECTOMY     left with reconstruction and lymph node excision  1992   MASTECTOMY     right with reconstruction   REVISION RECONSTRUCTED BREAST Left 02/2014   willard (plastics in [place])   right ankle arthroscopy     TEAR DUCT CANAL     TONSILLECTOMY AND ADENOIDECTOMY     UPPER GASTROINTESTINAL ENDOSCOPY     VAGINAL HYSTERECTOMY     ovaries  not excised    MEDICATIONS: Current Outpatient Medications on File Prior to Visit  Medication Sig Dispense Refill   aspirin 81 MG tablet Take 81 mg by mouth daily.       atorvastatin (LIPITOR) 20 MG tablet TAKE 1 TABLET BY MOUTH EVERY DAY 90 tablet 1   Calcium Carbonate (CALTRATE 600 PO) Take 1 tablet by mouth 2 (two) times daily.       citalopram (CELEXA) 20 MG tablet Take 1 tablet (20 mg total) by mouth daily. 30 tablet 5   Coenzyme Q10 (CO Q-10 PO) Take 50 mg by mouth daily.       empagliflozin (JARDIANCE) 10 MG TABS tablet Take 1 tablet (10 mg total) by mouth daily before breakfast. 30 tablet 11   famotidine (PEPCID) 40 MG tablet TAKE 1 TABLET BY MOUTH EVERY DAY 90 tablet 1   fluticasone (FLONASE) 50 MCG/ACT nasal spray Place 1 spray into both nostrils daily as  needed for allergies or rhinitis. 9.9 mL 5   furosemide (LASIX) 20 MG tablet Take 1 tablet (20 mg total) by mouth daily. 90 tablet 3   levothyroxine (SYNTHROID) 75 MCG tablet TAKE 1 TABLET EVERY MORNING BEFORE BREAKFAST 90 tablet 1   losartan (COZAAR) 50 MG tablet Take 1 tablet (50 mg total) by mouth daily. 90 tablet 3   meloxicam (MOBIC) 7.5 MG tablet TAKE 1/2 TO 1 TABLET EVERY DAY 90 tablet 0   metoprolol tartrate (LOPRESSOR) 25 MG tablet Take 1 tablet (25 mg total) by mouth 2 (two) times daily. 180 tablet 1   MULTIPLE VITAMIN PO Take 1 tablet by mouth daily.     Multiple Vitamins-Minerals (MULTIVITAMIN) LIQD      senna-docusate (SENOKOT-S) 8.6-50 MG per tablet Take 1 tablet by mouth daily.       VITAMIN D, CHOLECALCIFEROL, PO Take by mouth.     zolpidem (AMBIEN) 5 MG tablet Take 1 tablet (5 mg total) by mouth at bedtime as needed for sleep. 15 tablet 2   No current facility-administered medications on file prior to visit.    ALLERGIES: Allergies  Allergen Reactions   Clarithromycin Other (See Comments)    PATIENT UNSURE OF REACTION   Clindamycin Other (See Comments)   Codeine     REACTION: nausea   Levofloxacin      REACTION: nausea   Morphine Other (See Comments)    Nausea  Nausea    Naproxen     REACTION: nausea   Omeprazole Other (See Comments)    Made her jittery, made her feel weird, ?rash   Penicillins Other (See Comments) and Rash    REACTION: RASH REACTION: RASH    Pneumococcal Vaccines Rash    Small rash on arm at injection site.     FAMILY HISTORY: Family History  Problem Relation Age of Onset   Colon cancer Sister    Colon cancer Sister    Breast cancer Mother    Lung disease Mother    Emphysema Father    Esophageal cancer Neg Hx    Rectal cancer Neg Hx    Stomach cancer Neg Hx    Thyroid disease Neg Hx     SOCIAL HISTORY: Social History   Socioeconomic History   Marital status: Divorced    Spouse name: Not on file   Number of children: 2   Years of education: Not on file   Highest education level: Not on file  Occupational History   Occupation: retired  Tobacco Use   Smoking status: Never   Smokeless tobacco: Never  Vaping Use   Vaping Use: Never used  Substance and Sexual Activity   Alcohol use: No    Alcohol/week: 0.0 standard drinks of alcohol   Drug use: No   Sexual activity: Not Currently  Other Topics Concern   Not on file  Social History Narrative   Divorced x 3, lives alone   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, school psychologist   Right handed    Social Determinants of Health   Financial Resource Strain: [place]  (09/02/2022)   Overall Financial Resource Strain (CARDIA)    Difficulty of Paying Living Expenses: Not hard at all  Food Insecurity: No Food Insecurity (09/02/2022)   Hunger Vital Sign    Worried About Running Out of Food in the Last Year: Never true    [place] in the Last Year: Never true  Transportation Needs: No Transportation Needs (09/02/2022)  PRAPARE - Hydrologist (Medical): No    Lack of Transportation (Non-Medical): No  Physical Activity: Inactive (09/02/2022)    Exercise Vital Sign    Days of Exercise per Week: 0 days    Minutes of Exercise per Session: 0 min  Stress: No Stress Concern Present (09/02/2022)   [place]    Feeling of Stress : Not at all  Social Connections: Moderately Integrated (09/02/2022)   Social Connection and Isolation Panel [NHANES]    Frequency of Communication with Friends and Family: More than three times a week    Frequency of Social Gatherings with Friends and Family: More than three times a week    Attends Religious Services: More than 4 times per year    Active Member of [employer] or Organizations: Yes    Attends Music therapist: More than 4 times per year    Marital Status: Divorced  Intimate Partner Violence: Not At Risk (09/02/2022)   Humiliation, Afraid, Rape, and Kick questionnaire    Fear of Current or Ex-Partner: No    Emotionally Abused: No    Physically Abused: No    Sexually Abused: No     PHYSICAL EXAM: Vitals:   10/28/22 0829  BP: (!) 157/71  Pulse: (!) 56  SpO2: 98%   General: No acute distress Head:  Normocephalic/atraumatic Skin/Extremities: No rash, no edema Neurological Exam: Mental status: alert and oriented to person, place, and time, no dysarthria or aphasia, Fund of knowledge is appropriate.  Recent and remote memory are intact.  Able to name objects and repeat phrases. Points taken from visuospatial/executive and serial 7s. MoCA 19/30.    10/28/2022    8:00 AM  Montreal Cognitive Assessment   Visuospatial/ Executive (0/5) 2  Naming (0/3) 2  Attention: Read list of digits (0/2) 2  Attention: Read list of letters (0/1) 1  Attention: Serial 7 subtraction starting at 100 (0/3) 1  Language: Repeat phrase (0/2) 1  Language : Fluency (0/1) 0  Abstraction (0/2) 1  Delayed Recall (0/5) 3  Orientation (0/6) 6  Total 19  Adjusted Score (based on education) 19    Cranial nerves: CN I: not tested CN II:  pupils equal, round, visual fields intact CN III, IV, VI:  full range of motion, no nystagmus, no ptosis CN V: facial sensation intact CN VII: upper and lower face symmetric CN VIII: hearing intact to conversation Bulk & Tone: normal, , no cogwheeling, no fasciculations. Motor: 5/5 throughout with no pronator drift. Sensation: intact to light touch, cold, vibration sense.  No extinction to double simultaneous stimulation.  Romberg test negative Deep Tendon Reflexes: +1 both UE, +2 both LE Cerebellar: no incoordination on finger to nose testing Gait: narrow-based and steady, mild difficulty with tandem walk but able Tremor: none +Postural instability.    IMPRESSION: This is a very pleasant 81 year old right-handed woman with a history of hypertension, hyperlipidemia, hypothyroidism, CAD s/p CABG, moderate aortic stenosis, presenting for evaluation of auditory hallucinations. Her neurological exam is normal, MoCA score today 19/30 however they deny any difficulties with complex tasks. No parkinsonian signs except for some postural instability. Etiology of auditory hallucinations unclear, they appear to get masked by sounds which would be unusual for seizures. No other symptoms to indicate a neurodegenerative condition. From a neurological standpoint, we will do a paraneoplastic panel and EEG. If routine EEG is normal, a 48-hour EEG will be done to further  evaluate symptoms.Neuropsychological testing may be considered if unrevealing. Anxiety may still be playing a role, it appears symptoms started after she was diagnosed with moderate aortic stenosis and started on Jardiance (I cannot find any reports of Jardiance causing auditory hallucinations), and she had similar symptoms in March 2023 again with increased stress. Mood appears to have improved with Citalopram but hallucinations continue. Follow-up after tests, call for any changes.    Thank you for allowing me to participate in the care of this  patient. Please do not hesitate to call for any questions or concerns.   Ellouise Newer, M.D.  CC: Angelica. Quay Mullen

## 2022-11-01 ENCOUNTER — Telehealth: Payer: Self-pay | Admitting: Anesthesiology

## 2022-11-01 ENCOUNTER — Ambulatory Visit (INDEPENDENT_AMBULATORY_CARE_PROVIDER_SITE_OTHER): Payer: Medicare Other | Admitting: Neurology

## 2022-11-01 DIAGNOSIS — R440 Auditory hallucinations: Secondary | ICD-10-CM | POA: Diagnosis not present

## 2022-11-01 NOTE — Telephone Encounter (Signed)
Ann from the [facility] left message with AN stating they have a question regarding this pt's specimen they need to know the date and time of collection. Call back number is [phone]

## 2022-11-01 NOTE — Progress Notes (Signed)
EEG complete - results pending 

## 2022-11-01 NOTE — Telephone Encounter (Signed)
[facility] and gave the date taken on 10/28/22 @ 9:55 AM

## 2022-11-03 ENCOUNTER — Other Ambulatory Visit: Payer: Self-pay | Admitting: Internal Medicine

## 2022-11-17 NOTE — Procedures (Signed)
ELECTROENCEPHALOGRAM REPORT  Date of Study: 11/01/2022  Patient's Name: [name] MRN: [id number] Date of Birth: 1940/12/22  Referring Provider: Dr. Ellouise Newer  Clinical History: This is an 82 year old woman with  auditory hallucinations. EEG for classification.  Medications: Aspirin, Lipitor, Celexa, Jardiance, Lasix, Synthroid, Metoprolol  Technical Summary: A multichannel digital EEG recording measured by the international 10-20 system with electrodes applied with paste and impedances below 5000 ohms performed in our laboratory with EKG monitoring in an awake patient.  Hyperventilation was not performed. Photic stimulation was performed.  The digital EEG was referentially recorded, reformatted, and digitally filtered in a variety of bipolar and referential montages for optimal display.    Description: The patient is awake during the recording.  During maximal wakefulness, there is a symmetric, medium voltage 9 Hz posterior dominant rhythm that attenuates with eye opening.  The record is symmetric.  Sleep was not captured. Photic stimulation did not elicit any abnormalities. She had an episode of hearing voices singing a hymn that stopped when she talked. There were no epileptiform discharges or electrographic seizures seen.    EKG lead was unremarkable.  Impression: This awake EEG is normal.   Clinical Correlation: A normal EEG does not exclude a clinical diagnosis of epilepsy. Typical episode of auditory hallucination did not show any EEG correlate.  If further clinical questions remain, prolonged EEG may be helpful.  Clinical correlation is advised.   Ellouise Newer, M.D.

## 2022-11-22 ENCOUNTER — Other Ambulatory Visit: Payer: Self-pay | Admitting: Internal Medicine

## 2022-11-26 ENCOUNTER — Telehealth: Payer: Self-pay | Admitting: Neurology

## 2022-11-26 NOTE — Telephone Encounter (Signed)
Pls let her know the EEG was normal, I did not see any seizure activity when she was hearing the singing while having her EEG done. We had discussed doing a longer EEG if her office EEG was normal, however since we captured an episode with the shorter EEG, I don't think doing the longer one provide more information. We can do a follow-up in the office and see if starting a different medication may help with these symptoms.

## 2022-11-26 NOTE — Telephone Encounter (Signed)
Pt would like to get the results from the EEG please call

## 2022-11-26 NOTE — Telephone Encounter (Signed)
Pt called an informed that the EEG was normal, I did not see any seizure activity when she was hearing the singing while having her EEG done. We had discussed doing a longer EEG if her office EEG was normal, however since we captured an episode with the shorter EEG, I don't think doing the longer one provide more information. We can do a follow-up in the office and see if starting a different medication may help with these symptoms.  Pt stated that she was feeling a little better. She stated that her PCP told her that her MRI showed 2 strokes and she is upset that she was not called back by Dr Delice Lesch, and she would like a call back to talk about it,

## 2022-11-29 NOTE — Telephone Encounter (Signed)
Spoke to patient, discussed brain MRI findings, I had personally reviewed her brain MRI and these are very small changes that are typically from chronic microvascular disease, main thing is controlling vascular risk factors. It is not causing any symptoms. She reports that sounds she hears at not like before, they just occur every now and then but not for very long. No changes with medications, sleep patterns. She was told that her hearing aids are 84 years old and need changing. However, she was still hearing the sounds with or without her hearing aids. We discussed normal EEG, and consideration for prolonged EEG. We agreed that since symptoms are better, she will look into getting new hearing aids first. If symptoms worsen, we may proceed with prolonged EEG. She will call and let us know her decision.

## 2022-12-11 ENCOUNTER — Other Ambulatory Visit: Payer: Self-pay | Admitting: Cardiovascular Disease

## 2023-01-11 ENCOUNTER — Other Ambulatory Visit: Payer: Self-pay | Admitting: Internal Medicine

## 2023-02-01 NOTE — Patient Instructions (Addendum)
      Blood work was ordered.   The lab is on the first floor.    Medications changes include :   none     Return in about 6 months (around 08/05/2023) for follow up.

## 2023-02-01 NOTE — Assessment & Plan Note (Signed)
Saw neuro - had MRI, EEG No obvious cause If symptoms persist consider prolonged EEG She still has them-but they are better She did change her hearing aids-not sure if that helped or not She does not seem to be too bothered by them

## 2023-02-01 NOTE — Progress Notes (Unsigned)
Subjective:    Patient ID: [name], female    DOB: [birth year], 82 y.o.   MRN: [id number]     HPI [name] is here for follow up of her chronic medical problems.  She had her hearing aids changed.  Once in a while she will hear the hymes  Eats two meals a day.  No regular exercise  Toenail was black - saw podiatrist.  It is getting better-looks like it is growing out.  B/l lower legs anteriorly red when she first wakes up.  It goes away.  She is not as active as she used to be.  She does not walk in the cold weather and may walk more when he gets warm up.  She is nervous about symptoms she could have with her aortic stenosis.  Medications and allergies reviewed with patient and updated if appropriate.  Current Outpatient Medications on File Prior to Visit  Medication Sig Dispense Refill   aspirin 81 MG tablet Take 81 mg by mouth daily.       atorvastatin (LIPITOR) 20 MG tablet TAKE 1 TABLET BY MOUTH EVERY DAY 90 tablet 1   Calcium Carbonate (CALTRATE 600 PO) Take 1 tablet by mouth 2 (two) times daily.       citalopram (CELEXA) 20 MG tablet TAKE 1 TABLET BY MOUTH EVERY DAY 90 tablet 2   empagliflozin (JARDIANCE) 10 MG TABS tablet Take 1 tablet (10 mg total) by mouth daily before breakfast. 30 tablet 11   famotidine (PEPCID) 40 MG tablet TAKE 1 TABLET BY MOUTH EVERY DAY 90 tablet 1   fluticasone (FLONASE) 50 MCG/ACT nasal spray Place 1 spray into both nostrils daily as needed for allergies or rhinitis. 9.9 mL 5   furosemide (LASIX) 20 MG tablet Take 1 tablet (20 mg total) by mouth daily. 90 tablet 3   levothyroxine (SYNTHROID) 75 MCG tablet TAKE 1 TABLET EVERY MORNING BEFORE BREAKFAST 90 tablet 1   losartan (COZAAR) 50 MG tablet TAKE 1 TABLET BY MOUTH EVERY DAY 90 tablet 0   meloxicam (MOBIC) 7.5 MG tablet TAKE 1/2 TO 1 TABLET EVERY DAY 90 tablet 0   metoprolol tartrate (LOPRESSOR) 25 MG tablet Take 1 tablet (25 mg total) by mouth 2 (two) times daily. 180 tablet 1    MULTIPLE VITAMIN PO Take 1 tablet by mouth daily.     Multiple Vitamins-Minerals (MULTIVITAMIN) LIQD      senna-docusate (SENOKOT-S) 8.6-50 MG per tablet Take 1 tablet by mouth daily.       VITAMIN D, CHOLECALCIFEROL, PO Take by mouth.     zolpidem (AMBIEN) 5 MG tablet TAKE 1 TABLET BY MOUTH AT BEDTIME AS NEEDED FOR SLEEP. 30 tablet 2   Coenzyme Q10 (CO Q-10 PO) Take 50 mg by mouth daily.   (Patient not taking: Reported on 02/02/2023)     No current facility-administered medications on file prior to visit.     Review of Systems  Constitutional:  Negative for fever.  HENT:  Positive for rhinorrhea.   Respiratory:  Positive for shortness of breath (with exertion - no change). Negative for cough and wheezing.   Cardiovascular:  Positive for leg swelling (mild LLE). Negative for chest pain and palpitations.  Musculoskeletal:  Positive for arthralgias.  Neurological:  Positive for headaches (sinus). Negative for light-headedness.       Objective:   Vitals:   02/02/23 1433  BP: 124/80  Pulse: (!) 55  Temp: 97.8 F (36.6 C)  SpO2: 97%  BP Readings from Last 3 Encounters:  02/02/23 124/80  10/28/22 (!) 157/71  10/27/22 121/61   Wt Readings from Last 3 Encounters:  02/02/23 123 lb (55.8 kg)  10/28/22 122 lb 12.8 oz (55.7 kg)  10/12/22 123 lb (55.8 kg)   Body mass index is 21.79 kg/m.    Physical Exam Constitutional:      General: She is not in acute distress.    Appearance: Normal appearance.  HENT:     Head: Normocephalic and atraumatic.  Eyes:     Conjunctiva/sclera: Conjunctivae normal.  Cardiovascular:     Rate and Rhythm: Normal rate and regular rhythm.     Heart sounds: Normal heart sounds.  Pulmonary:     Effort: Pulmonary effort is normal. No respiratory distress.     Breath sounds: Normal breath sounds. No wheezing.  Musculoskeletal:     Cervical back: Neck supple.     Right lower leg: No edema.     Left lower leg: No edema.  Lymphadenopathy:      Cervical: No cervical adenopathy.  Skin:    General: Skin is warm and dry.     Findings: No rash.  Neurological:     Mental Status: She is alert. Mental status is at baseline.  Psychiatric:        Mood and Affect: Mood normal.        Behavior: Behavior normal.        Lab Results  Component Value Date   WBC 6.2 09/27/2022   HGB 13.0 09/27/2022   HCT 37.7 09/27/2022   PLT 238 09/27/2022   GLUCOSE 95 09/27/2022   CHOL 131 08/04/2022   TRIG 79.0 08/04/2022   HDL 53.60 08/04/2022   LDLCALC 61 08/04/2022   ALT 19 09/27/2022   AST 27 09/27/2022   NA 135 09/27/2022   K 3.8 09/27/2022   CL 103 09/27/2022   CREATININE 0.57 09/27/2022   BUN 8 09/27/2022   CO2 24 09/27/2022   TSH 1.47 08/04/2022   INR 1.0 ratio 10/26/2010   HGBA1C 5.0 08/04/2022     Assessment & Plan:    See Problem List for Assessment and Plan of chronic medical problems.

## 2023-02-02 ENCOUNTER — Ambulatory Visit (INDEPENDENT_AMBULATORY_CARE_PROVIDER_SITE_OTHER): Payer: Medicare Other | Admitting: Internal Medicine

## 2023-02-02 ENCOUNTER — Encounter: Payer: Self-pay | Admitting: Internal Medicine

## 2023-02-02 VITALS — BP 124/80 | HR 55 | Temp 97.8°F | Ht 63.0 in | Wt 123.0 lb

## 2023-02-02 DIAGNOSIS — I779 Disorder of arteries and arterioles, unspecified: Secondary | ICD-10-CM | POA: Diagnosis not present

## 2023-02-02 DIAGNOSIS — F32A Depression, unspecified: Secondary | ICD-10-CM | POA: Diagnosis not present

## 2023-02-02 DIAGNOSIS — I10 Essential (primary) hypertension: Secondary | ICD-10-CM | POA: Diagnosis not present

## 2023-02-02 DIAGNOSIS — K219 Gastro-esophageal reflux disease without esophagitis: Secondary | ICD-10-CM

## 2023-02-02 DIAGNOSIS — F419 Anxiety disorder, unspecified: Secondary | ICD-10-CM | POA: Diagnosis not present

## 2023-02-02 DIAGNOSIS — R739 Hyperglycemia, unspecified: Secondary | ICD-10-CM

## 2023-02-02 DIAGNOSIS — R440 Auditory hallucinations: Secondary | ICD-10-CM

## 2023-02-02 DIAGNOSIS — E7849 Other hyperlipidemia: Secondary | ICD-10-CM

## 2023-02-02 DIAGNOSIS — E039 Hypothyroidism, unspecified: Secondary | ICD-10-CM

## 2023-02-02 DIAGNOSIS — I25119 Atherosclerotic heart disease of native coronary artery with unspecified angina pectoris: Secondary | ICD-10-CM

## 2023-02-02 DIAGNOSIS — I5032 Chronic diastolic (congestive) heart failure: Secondary | ICD-10-CM

## 2023-02-02 DIAGNOSIS — G4709 Other insomnia: Secondary | ICD-10-CM

## 2023-02-02 LAB — CBC WITH DIFFERENTIAL/PLATELET
Basophils Absolute: 0 10*3/uL (ref 0.0–0.1)
Basophils Relative: 0.6 % (ref 0.0–3.0)
Eosinophils Absolute: 0.3 10*3/uL (ref 0.0–0.7)
Eosinophils Relative: 5 % (ref 0.0–5.0)
HCT: 41.7 % (ref 36.0–46.0)
Hemoglobin: 13.8 g/dL (ref 12.0–15.0)
Lymphocytes Relative: 30.7 % (ref 12.0–46.0)
Lymphs Abs: 2 10*3/uL (ref 0.7–4.0)
MCHC: 33 g/dL (ref 30.0–36.0)
MCV: 89 fl (ref 78.0–100.0)
Monocytes Absolute: 0.6 10*3/uL (ref 0.1–1.0)
Monocytes Relative: 9.5 % (ref 3.0–12.0)
Neutro Abs: 3.5 10*3/uL (ref 1.4–7.7)
Neutrophils Relative %: 54.2 % (ref 43.0–77.0)
Platelets: 267 10*3/uL (ref 150.0–400.0)
RBC: 4.69 Mil/uL (ref 3.87–5.11)
RDW: 14.8 % (ref 11.5–15.5)
WBC: 6.4 10*3/uL (ref 4.0–10.5)

## 2023-02-02 LAB — LIPID PANEL
Cholesterol: 132 mg/dL (ref 0–200)
HDL: 55.4 mg/dL (ref >39.00)
LDL Cholesterol: 61 mg/dL (ref 0–99)
NonHDL: 76.25
Total CHOL/HDL Ratio: 2
Triglycerides: 77 mg/dL (ref 0.0–149.0)
VLDL: 15.4 mg/dL (ref 0.0–40.0)

## 2023-02-02 LAB — COMPREHENSIVE METABOLIC PANEL
ALT: 17 U/L (ref 0–35)
AST: 24 U/L (ref 0–37)
Albumin: 3.9 g/dL (ref 3.5–5.2)
Alkaline Phosphatase: 71 U/L (ref 39–117)
BUN: 14 mg/dL (ref 6–23)
CO2: 31 mEq/L (ref 19–32)
Calcium: 9.2 mg/dL (ref 8.4–10.5)
Chloride: 99 mEq/L (ref 96–112)
Creatinine, Ser: 0.71 mg/dL (ref 0.40–1.20)
GFR: 78.29 mL/min (ref >60.00)
Glucose, Bld: 75 mg/dL (ref 70–99)
Potassium: 4.3 mEq/L (ref 3.5–5.1)
Sodium: 135 mEq/L (ref 135–145)
Total Bilirubin: 1 mg/dL (ref 0.2–1.2)
Total Protein: 6.5 g/dL (ref 6.0–8.3)

## 2023-02-02 LAB — HEMOGLOBIN A1C: Hgb A1c MFr Bld: 5.9 % (ref 4.6–6.5)

## 2023-02-02 LAB — TSH: TSH: 0.81 u[IU]/mL (ref 0.35–5.50)

## 2023-02-02 MED ORDER — ZOLPIDEM TARTRATE 5 MG PO TABS: 5.0000 mg | ORAL_TABLET | Freq: Every evening | ORAL | 5 refills | Status: DC | PRN

## 2023-02-02 MED ORDER — FAMOTIDINE 40 MG PO TABS: 40.0000 mg | ORAL_TABLET | Freq: Every day | ORAL | 1 refills | Status: DC

## 2023-02-02 MED ORDER — METOPROLOL TARTRATE 25 MG PO TABS: 25.0000 mg | ORAL_TABLET | Freq: Two times a day (BID) | ORAL | 1 refills | Status: DC

## 2023-02-02 MED ORDER — LEVOTHYROXINE SODIUM 75 MCG PO TABS: ORAL_TABLET | ORAL | 1 refills | Status: DC

## 2023-02-02 NOTE — Assessment & Plan Note (Addendum)
Chronic Anxiety is  controlled Continue citalopram to 20 mg daily - has 10 mg at home and will use them up

## 2023-02-02 NOTE — Assessment & Plan Note (Signed)
Chronic Check a1c Low sugar / carb diet Stressed regular exercise  

## 2023-02-02 NOTE — Assessment & Plan Note (Signed)
Chronic  Clinically euthyroid Check tsh and will titrate med dose if needed Currently taking levothyroxine 75 mcg daily 

## 2023-02-02 NOTE — Assessment & Plan Note (Signed)
Chronic Continue atorvastatin 20 mg daily, ASA 81 mg daily

## 2023-02-02 NOTE — Assessment & Plan Note (Signed)
Chronic Controlled, stable Continue ambien 5 mg nightly prn

## 2023-02-02 NOTE — Assessment & Plan Note (Signed)
Chronic Blood pressure is controlled  CMP Continue losartan 50 mg daily, metoprolol 25 mg twice daily

## 2023-02-02 NOTE — Assessment & Plan Note (Signed)
Chronic Follow with cardiology Has chronic shortness of breath but has not changed No chest pain, palpitations Continue atorvastatin 20 mg daily, aspirin 81 mg daily Blood pressure well-controlled

## 2023-02-02 NOTE — Assessment & Plan Note (Signed)
Chronic GERD controlled Continue Pepcid 40 mg daily  

## 2023-02-02 NOTE — Assessment & Plan Note (Signed)
Chronic °Regular exercise and healthy diet encouraged °Check lipid panel  °Continue atorvastatin 20 mg daily °

## 2023-02-02 NOTE — Assessment & Plan Note (Signed)
Chronic Following with Dr Burt Knack  Grade 2 DD Taking lasix 20 mg daily Taking Jardiance 10 mg daily Appears euvolemic

## 2023-02-03 ENCOUNTER — Encounter: Payer: Self-pay | Admitting: Internal Medicine

## 2023-02-23 ENCOUNTER — Ambulatory Visit (HOSPITAL_COMMUNITY): Payer: Medicare Other | Attending: Internal Medicine

## 2023-02-23 DIAGNOSIS — E782 Mixed hyperlipidemia: Secondary | ICD-10-CM

## 2023-02-23 DIAGNOSIS — I10 Essential (primary) hypertension: Secondary | ICD-10-CM | POA: Diagnosis not present

## 2023-02-23 DIAGNOSIS — I25119 Atherosclerotic heart disease of native coronary artery with unspecified angina pectoris: Secondary | ICD-10-CM

## 2023-02-23 DIAGNOSIS — I350 Nonrheumatic aortic (valve) stenosis: Secondary | ICD-10-CM

## 2023-02-23 DIAGNOSIS — I5032 Chronic diastolic (congestive) heart failure: Secondary | ICD-10-CM | POA: Diagnosis not present

## 2023-02-23 LAB — ECHOCARDIOGRAM COMPLETE
AR max vel: 0.63 cm2
AV Area VTI: 0.72 cm2
AV Area mean vel: 0.62 cm2
AV Mean grad: 25 mmHg
AV Peak grad: 44.1 mmHg
Ao pk vel: 3.32 m/s
Area-P 1/2: 3.48 cm2
P 1/2 time: 572 msec
S' Lateral: 2.5 cm

## 2023-02-28 ENCOUNTER — Ambulatory Visit: Payer: Medicare Other | Attending: Cardiovascular Disease | Admitting: Cardiovascular Disease

## 2023-02-28 ENCOUNTER — Encounter: Payer: Self-pay | Admitting: Cardiovascular Disease

## 2023-02-28 VITALS — BP 148/72 | HR 57 | Ht 62.0 in | Wt 126.2 lb

## 2023-02-28 DIAGNOSIS — I25119 Atherosclerotic heart disease of native coronary artery with unspecified angina pectoris: Secondary | ICD-10-CM | POA: Diagnosis not present

## 2023-02-28 DIAGNOSIS — I350 Nonrheumatic aortic (valve) stenosis: Secondary | ICD-10-CM | POA: Diagnosis not present

## 2023-02-28 DIAGNOSIS — I10 Essential (primary) hypertension: Secondary | ICD-10-CM | POA: Diagnosis not present

## 2023-02-28 DIAGNOSIS — I5032 Chronic diastolic (congestive) heart failure: Secondary | ICD-10-CM | POA: Diagnosis not present

## 2023-02-28 NOTE — Progress Notes (Addendum)
Pre Surgical Assessment: 5 M Walk Test  38M=16.5ft  5 Meter Walk Test- trial 1: 8.10 seconds 5 Meter Walk Test- trial 2: 8.02 seconds 5 Meter Walk Test- trial 3: 7.56 seconds 5 Meter Walk Test Average: 7.89 seconds   STS surgical risk calculator:   Procedure Type: Isolated AVR PERIOPERATIVE OUTCOME ESTIMATE % Operative Mortality 4.9% Morbidity & Mortality 9.29% Stroke 2.96% Renal Failure 0.954% Reoperation 3.25% Prolonged Ventilation 5.69% Deep Sternal Wound Infection 0.073% Long Hospital Stay (>14 days) 4.58% Short Hospital Stay (<6 days)* 34.4%

## 2023-02-28 NOTE — Progress Notes (Signed)
Cardiology Office Note:    Date:  02/28/2023   ID:  [name], [name] July 03, 1941, MRN [id number]  PCP:  Pincus Sanes, MD   [facility] Providers Cardiologist:  Tonny Bollman, MD     Referring MD: Pincus Sanes, MD   Chief Complaint  Patient presents with   Shortness of Breath    History of Present Illness:    [name] is a 82 y.o. female with a hx of coronary artery disease and aortic stenosis, presenting for follow-up evaluation.  The patient has multivessel CAD and was treated with CABG in 2007.  Cardiac catheterization in 2011 demonstrated patency of all of her bypass grafts.  She has developed moderate aortic stenosis and has been followed with serial echo studies.   The patient is here alone today.  She reports no significant change in her symptoms.  She continues to have exertional dyspnea and fatigue.  She has not had orthopnea or PND.  She has some mild swelling in the left ankle at the end of the day.  No chest pain or pressure.  She had a recent echocardiogram demonstrating some progression of her aortic stenosis and we discussed this at length today.  Past Medical History:  Diagnosis Date   ALLERGIC RHINITIS    Allergy    SEASONAL   Anxiety    Aortic stenosis, moderate    Blood transfusion without reported diagnosis    BREAST CANCER 1991   left, 1991; B mastectomy   Cataract    BILATERAL-REMOVED   Coronary artery disease    s/p CABGx5 2007 //Nuclear stress test 10/18: EF 76, no ischemia or infarction, low risk   Depression    DIVERTICULOSIS    DVT    EROSIVE GASTRITIS    GERD    Heart murmur    History of DVT (deep vein thrombosis)    HYPERLIPIDEMIA    Hypertension    HYPOTHYROIDISM    Occlusion and stenosis of carotid artery without mention of cerebral infarction    Osteopenia    OSTEOPOROSIS    RHINOSINUSITIS, RECURRENT     Past Surgical History:  Procedure Laterality Date   APPENDECTOMY     COLONOSCOPY     CORONARY ARTERY  BYPASS GRAFT     5        2007   MASTECTOMY     left with reconstruction and lymph node excision  1992   MASTECTOMY     right with reconstruction   REVISION RECONSTRUCTED BREAST Left 02/2014   willard (plastics in [place])   right ankle arthroscopy     TEAR DUCT CANAL     TONSILLECTOMY AND ADENOIDECTOMY     UPPER GASTROINTESTINAL ENDOSCOPY     VAGINAL HYSTERECTOMY     ovaries not excised    Current Medications: Current Meds  Medication Sig   aspirin 81 MG tablet Take 81 mg by mouth daily.     atorvastatin (LIPITOR) 20 MG tablet TAKE 1 TABLET BY MOUTH EVERY DAY   Calcium Carbonate (CALTRATE 600 PO) Take 1 tablet by mouth 2 (two) times daily.     citalopram (CELEXA) 20 MG tablet TAKE 1 TABLET BY MOUTH EVERY DAY   empagliflozin (JARDIANCE) 10 MG TABS tablet Take 1 tablet (10 mg total) by mouth daily before breakfast.   famotidine (PEPCID) 40 MG tablet Take 1 tablet (40 mg total) by mouth daily.   fluticasone (FLONASE) 50 MCG/ACT nasal spray Place 1 spray into both nostrils daily  as needed for allergies or rhinitis.   furosemide (LASIX) 20 MG tablet Take 1 tablet (20 mg total) by mouth daily.   levothyroxine (SYNTHROID) 75 MCG tablet TAKE 1 TABLET EVERY MORNING BEFORE BREAKFAST   losartan (COZAAR) 50 MG tablet TAKE 1 TABLET BY MOUTH EVERY DAY   meloxicam (MOBIC) 7.5 MG tablet TAKE 1/2 TO 1 TABLET EVERY DAY   metoprolol tartrate (LOPRESSOR) 25 MG tablet Take 1 tablet (25 mg total) by mouth 2 (two) times daily.   MULTIPLE VITAMIN PO Take 1 tablet by mouth daily.   Multiple Vitamins-Minerals (MULTIVITAMIN) LIQD    senna-docusate (SENOKOT-S) 8.6-50 MG per tablet Take 1 tablet by mouth daily.     VITAMIN D, CHOLECALCIFEROL, PO Take by mouth.   zolpidem (AMBIEN) 5 MG tablet Take 1 tablet (5 mg total) by mouth at bedtime as needed. for sleep     Allergies:   Clarithromycin, Clindamycin, Codeine, Levofloxacin, Morphine, Naproxen, Omeprazole, Penicillins, and Pneumococcal vaccines   Social  History   Socioeconomic History   Marital status: Divorced    Spouse name: Not on file   Number of children: 2   Years of education: Not on file   Highest education level: Not on file  Occupational History   Occupation: retired  Tobacco Use   Smoking status: Never   Smokeless tobacco: Never  Vaping Use   Vaping Use: Never used  Substance and Sexual Activity   Alcohol use: No    Alcohol/week: 0.0 standard drinks of alcohol   Drug use: No   Sexual activity: Not Currently  Other Topics Concern   Not on file  Social History Narrative   Divorced x 3, lives alone   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, school psychologist   Right handed    Social Determinants of Health   Financial Resource Strain: Low Risk  (09/02/2022)   Overall Financial Resource Strain (CARDIA)    Difficulty of Paying Living Expenses: Not hard at all  Food Insecurity: No Food Insecurity (09/02/2022)   Hunger Vital Sign    Worried About Running Out of Food in the Last Year: Never true    Ran Out of Food in the Last Year: Never true  Transportation Needs: No Transportation Needs (09/02/2022)   PRAPARE - Administrator, Civil Service (Medical): No    Lack of Transportation (Non-Medical): No  Physical Activity: Inactive (09/02/2022)   Exercise Vital Sign    Days of Exercise per Week: 0 days    Minutes of Exercise per Session: 0 min  Stress: No Stress Concern Present (09/02/2022)   [employer] of Occupational Health - Occupational Stress Questionnaire    Feeling of Stress : Not at all  Social Connections: Moderately Integrated (09/02/2022)   Social Connection and Isolation Panel [NHANES]    Frequency of Communication with Friends and Family: More than three times a week    Frequency of Social Gatherings with Friends and Family: More than three times a week    Attends Religious Services: More than 4 times per year    Active Member of [employer] or Organizations: Yes    Attends Museum/gallery exhibitions officer: More than 4 times per year    Marital Status: Divorced     Family History: The patient's family history includes Breast cancer in her mother; Colon cancer in her sister and sister; Emphysema in her father; Lung disease in her mother. There is no history of Esophageal cancer, Rectal cancer, Stomach cancer,  or Thyroid disease.  ROS:   Please see the history of present illness.    All other systems reviewed and are negative.  EKGs/Labs/Other Studies Reviewed:    The following studies were reviewed today: Cardiac Studies & Procedures     STRESS TESTS  MYOCARDIAL PERFUSION IMAGING 12/17/2021  Narrative   The study is normal. Findings are consistent with no prior ischemia and no prior myocardial infarction. The study is low risk.   No ST deviation was noted.   LV perfusion is normal. There is no evidence of ischemia. There is no evidence of infarction.   Left ventricular function is normal. Nuclear stress EF: 71 %. The left ventricular ejection fraction is hyperdynamic (>65%). End diastolic cavity size is normal. End systolic cavity size is normal.   Prior study available for comparison from 09/02/2017. No changes compared to prior study.   ECHOCARDIOGRAM  ECHOCARDIOGRAM COMPLETE 02/23/2023  Narrative ECHOCARDIOGRAM REPORT    Patient Name:   [name] Date of Exam: 02/23/2023 Medical Rec #:  [id number]     Height:       63.0 in Accession #:    [id number]    Weight:       123.0 lb Date of Birth:  12-18-1940      BSA:          1.573 m Patient Age:    82 years      BP:           124/80 mmHg Patient Gender: F             HR:           73 bpm. Exam Location:  [address]  Procedure: 2D Echo, Cardiac Doppler and Color Doppler  Indications:    I35.0 Aortic Stenosis  History:        Patient has prior history of Echocardiogram examinations, most recent 07/23/2022. CAD, Prior CABG, Signs/Symptoms:Murmur; Risk Factors:Hypertension and Dyslipidemia. DVT. History  of breast cancer. Bilateral breast implants.  Sonographer:    Sedonia Small Rodgers-Jones RDCS Referring Phys: [id number] Lawren Sexson  IMPRESSIONS   1. Left ventricular ejection fraction, by estimation, is 60 to 65%. Left ventricular ejection fraction by PLAX is 64 %. The left ventricle has normal function. The left ventricle has no regional wall motion abnormalities. There is mild left ventricular hypertrophy. Left ventricular diastolic parameters are consistent with Grade III diastolic dysfunction (restrictive). Elevated left ventricular end-diastolic pressure. 2. Right ventricular systolic function is low normal. The right ventricular size is normal. There is moderately elevated pulmonary artery systolic pressure. The estimated right ventricular systolic pressure is 51.6 mmHg. 3. Left atrial size was moderately dilated. 4. The mitral valve is degenerative. Trivial mitral valve regurgitation. 5. The aortic valve is calcified. There is severe calcifcation of the aortic valve. Aortic valve regurgitation is trivial. Moderate to severe aortic valve stenosis. Aortic regurgitation PHT measures 572 msec. Aortic valve area, by VTI measures 0.72 cm. Aortic valve mean gradient measures 25.0 mmHg. Aortic valve Vmax measures 3.32 m/s. Peak gradient 44 mmHg, DI 0.32. 6. The pulmonic valve was abnormal. 7. Aortic dilatation noted. There is borderline dilatation of the ascending aorta, measuring 39 mm. 8. The inferior vena cava is normal in size with <50% respiratory variability, suggesting right atrial pressure of 8 mmHg.  Comparison(s): Changes from prior study are noted. 07/23/2022: LVEF 60-65%, mild to moderate AS - mean gradient 19 mmHg.  FINDINGS Left Ventricle: Left ventricular ejection fraction, by estimation, is 60 to 65%. Left  ventricular ejection fraction by PLAX is 64 %. The left ventricle has normal function. The left ventricle has no regional wall motion abnormalities. The left ventricular internal  cavity size was normal in size. There is mild left ventricular hypertrophy. Left ventricular diastolic parameters are consistent with Grade III diastolic dysfunction (restrictive). Elevated left ventricular end-diastolic pressure.  Right Ventricle: The right ventricular size is normal. No increase in right ventricular wall thickness. Right ventricular systolic function is low normal. There is moderately elevated pulmonary artery systolic pressure. The tricuspid regurgitant velocity is 3.30 m/s, and with an assumed right atrial pressure of 8 mmHg, the estimated right ventricular systolic pressure is 51.6 mmHg.  Left Atrium: Left atrial size was moderately dilated.  Right Atrium: Right atrial size was normal in size.  Pericardium: There is no evidence of pericardial effusion.  Mitral Valve: The mitral valve is degenerative in appearance. There is moderate calcification of the posterior and anterior mitral valve leaflet(s). Mild to moderate mitral annular calcification. Trivial mitral valve regurgitation.  Tricuspid Valve: The tricuspid valve is grossly normal. Tricuspid valve regurgitation is mild.  Aortic Valve: The aortic valve is calcified. There is severe calcifcation of the aortic valve. Aortic valve regurgitation is trivial. Aortic regurgitation PHT measures 572 msec. Moderate to severe aortic stenosis is present. Aortic valve mean gradient measures 25.0 mmHg. Aortic valve peak gradient measures 44.1 mmHg. Aortic valve area, by VTI measures 0.72 cm.  Pulmonic Valve: The pulmonic valve was abnormal. Pulmonic valve regurgitation is mild to moderate.  Aorta: Aortic dilatation noted. There is borderline dilatation of the ascending aorta, measuring 39 mm.  Venous: The inferior vena cava is normal in size with less than 50% respiratory variability, suggesting right atrial pressure of 8 mmHg.  IAS/Shunts: No atrial level shunt detected by color flow Doppler.   LEFT VENTRICLE PLAX 2D LV EF:          Left            Diastology ventricular     LV e' medial:    4.40 cm/s ejection        LV E/e' medial:  32.0 fraction by     LV e' lateral:   6.74 cm/s PLAX is 64      LV E/e' lateral: 20.9 %. LVIDd:         3.80 cm LVIDs:         2.50 cm LV PW:         1.00 cm LV IVS:        1.00 cm LVOT diam:     1.70 cm LV SV:         58 LV SV Index:   37 LVOT Area:     2.27 cm   RIGHT VENTRICLE             IVC RV Basal diam:  4.00 cm     IVC diam: 1.50 cm RV S prime:     11.00 cm/s TAPSE (M-mode): 2.2 cm  LEFT ATRIUM             Index        RIGHT ATRIUM           Index LA diam:        4.20 cm 2.67 cm/m   RA Area:     11.70 cm LA Vol (A2C):   48.7 ml 30.97 ml/m  RA Volume:   26.90 ml  17.10 ml/m LA Vol (A4C):   55.0 ml 34.97 ml/m  LA Biplane Vol: 53.6 ml 34.08 ml/m AORTIC VALVE AV Area (Vmax):    0.63 cm AV Area (Vmean):   0.62 cm AV Area (VTI):     0.72 cm AV Vmax:           332.00 cm/s AV Vmean:          231.000 cm/s AV VTI:            0.810 m AV Peak Grad:      44.1 mmHg AV Mean Grad:      25.0 mmHg LVOT Vmax:         92.40 cm/s LVOT Vmean:        63.400 cm/s LVOT VTI:          0.256 m LVOT/AV VTI ratio: 0.32 AI PHT:            572 msec  AORTA Ao Root diam: 3.00 cm Ao Asc diam:  3.90 cm  MITRAL VALVE                TRICUSPID VALVE MV Area (PHT): 3.48 cm     TR Peak grad:   43.6 mmHg MV Decel Time: 218 msec     TR Vmax:        330.00 cm/s MV E velocity: 141.00 cm/s MV A velocity: 43.60 cm/s   SHUNTS MV E/A ratio:  3.23         Systemic VTI:  0.26 m Systemic Diam: 1.70 cm  Zoila Shutter MD Electronically signed by Zoila Shutter MD Signature Date/Time: 02/23/2023/4:12:14 PM    Final              EKG:  EKG is not ordered today.    Recent Labs: 08/04/2022: Pro B Natriuretic peptide (BNP) 232.0 09/27/2022: Magnesium 2.0 02/02/2023: ALT 17; BUN 14; Creatinine, Ser 0.71; Hemoglobin 13.8; Platelets 267.0; Potassium 4.3; Sodium 135; TSH 0.81  Recent  Lipid Panel    Component Value Date/Time   CHOL 132 02/02/2023 1523   CHOL 134 07/28/2018 1153   TRIG 77.0 02/02/2023 1523   HDL 55.40 02/02/2023 1523   HDL 52 07/28/2018 1153   CHOLHDL 2 02/02/2023 1523   VLDL 15.4 02/02/2023 1523   LDLCALC 61 02/02/2023 1523   LDLCALC 71 07/30/2020 1631     Risk Assessment/Calculations:           Physical Exam:    VS:  BP (!) 148/72 (BP Location: Right Arm, Patient Position: Sitting, Cuff Size: Normal)   Pulse (!) 57   Ht 5\' 2"  (1.575 m)   Wt 126 lb 3.2 oz (57.2 kg)   SpO2 95%   BMI 23.08 kg/m     Wt Readings from Last 3 Encounters:  02/28/23 126 lb 3.2 oz (57.2 kg)  02/02/23 123 lb (55.8 kg)  10/28/22 122 lb 12.8 oz (55.7 kg)     GEN:  Well nourished, well developed in no acute distress HEENT: Normal NECK: No JVD; No carotid bruits LYMPHATICS: No lymphadenopathy CARDIAC: RRR, 3/6 harsh late peaking crescendo decrescendo murmur at the right upper sternal border RESPIRATORY:  Clear to auscultation without rales, wheezing or rhonchi  ABDOMEN: Soft, non-tender, non-distended MUSCULOSKELETAL:  No edema; No deformity  SKIN: Warm and dry NEUROLOGIC:  Alert and oriented x 3 PSYCHIATRIC:  Normal affect   ASSESSMENT:    1. Coronary artery disease involving native coronary artery of native heart with angina pectoris   2. Aortic stenosis, moderate   3. Chronic diastolic heart failure  4. Essential hypertension    PLAN:    In order of problems listed above:  The patient appears clinically stable.  Her medical program is reviewed and includes aspirin for antiplatelet therapy, statin drug, Jardiance, and losartan.  Continue current management. The patient's echo study is carefully reviewed.  She does have severe calcification and restriction of the aortic valve leaflets.  Her peak and mean gradients remain in the moderate range at 44 and 25 mmHg, respectively.  However, her aortic valve area now calculates to 0.7 cm.  Her peak  transaortic velocity is 3.3 m/s.  Her gradients are significantly higher than they were last year when her peak and mean gradients were 31 and 19 mmHg.  The patient has restrictive filling and diastolic dysfunction on her echo study.  She has had elevated BNP.  We discussed the natural history of aortic stenosis, its relationship to her diastolic heart failure and restrictive filling, and potential treatment options.  She has had previous sternotomy for multivessel CABG.  If her anatomy is amenable, she would probably be a good candidate for TAVR.  I have recommended that we proceed with a gated cardiac CTA and a CTA of the chest, abdomen, and pelvis to further evaluate her treatment options.  Once that is completed, I will reach out to her and discuss next steps which could include diagnostic right and left heart catheterization and surgical consultation as part of a multidisciplinary approach to her care. As above, continue current medical therapy. Continue current management.  Medications reviewed today.           Medication Adjustments/Labs and Tests Ordered: Current medicines are reviewed at length with the patient today.  Concerns regarding medicines are outlined above.  No orders of the defined types were placed in this encounter.  No orders of the defined types were placed in this encounter.   There are no Patient Instructions on file for this visit.   Signed, Tonny Bollman, MD  02/28/2023 2:54 PM    [facility]

## 2023-02-28 NOTE — Patient Instructions (Signed)
Medication Instructions:  Your physician recommends that you continue on your current medications as directed. Please refer to the Current Medication list given to you today.  *If you need a refill on your cardiac medications before your next appointment, please call your pharmacy*   Lab Work: BMET today If you have labs (blood work) drawn today and your tests are completely normal, you will receive your results only by: MyChart Message (if you have MyChart) OR A paper copy in the mail If you have any lab test that is abnormal or we need to change your treatment, we will call you to review the results.   Testing/Procedures: TAVR CT's (you will be called to schedule)   Follow-Up: At [facility], you and your health needs are our priority.  As part of our continuing mission to provide you with exceptional heart care, we have created designated Provider Care Teams.  These Care Teams include your primary Cardiologist (physician) and Advanced Practice Providers (APPs -  Physician Assistants and Nurse Practitioners) who all work together to provide you with the care you need, when you need it.  We recommend signing up for the patient portal called "MyChart".  Sign up information is provided on this After Visit Summary.  MyChart is used to connect with patients for Virtual Visits (Telemedicine).  Patients are able to view lab/test results, encounter notes, upcoming appointments, etc.  Non-urgent messages can be sent to your provider as well.   To learn more about what you can do with MyChart, go to [email or web address].    Your next appointment:   3 month(s)  Provider:   Tonny Bollman, MD

## 2023-03-01 LAB — BASIC METABOLIC PANEL
BUN/Creatinine Ratio: 19 (ref 12–28)
BUN: 15 mg/dL (ref 8–27)
CO2: 24 mmol/L (ref 20–29)
Calcium: 9.5 mg/dL (ref 8.7–10.3)
Chloride: 101 mmol/L (ref 96–106)
Creatinine, Ser: 0.77 mg/dL (ref 0.57–1.00)
Glucose: 83 mg/dL (ref 70–99)
Potassium: 4.5 mmol/L (ref 3.5–5.2)
Sodium: 138 mmol/L (ref 134–144)
eGFR: 76 mL/min/{1.73_m2} (ref >59)

## 2023-03-02 ENCOUNTER — Other Ambulatory Visit: Payer: Self-pay

## 2023-03-02 ENCOUNTER — Telehealth: Payer: Self-pay | Admitting: Internal Medicine

## 2023-03-02 DIAGNOSIS — I350 Nonrheumatic aortic (valve) stenosis: Secondary | ICD-10-CM

## 2023-03-02 NOTE — Telephone Encounter (Signed)
Patient would like to know what your opinion is on rather she should undergo aortic valve replacement?  Please call patient.  [phone]

## 2023-03-02 NOTE — Telephone Encounter (Signed)
Yes, I think she needs to undergo the aortic valve replacement.

## 2023-03-03 NOTE — Telephone Encounter (Signed)
Spoke with patient today. 

## 2023-03-08 ENCOUNTER — Ambulatory Visit (HOSPITAL_COMMUNITY)
Admission: RE | Admit: 2023-03-08 | Discharge: 2023-03-08 | Disposition: A | Discharge status: Home / Self Care | Payer: Medicare Other | Source: Ambulatory Visit | Attending: Cardiovascular Disease | Admitting: Cardiovascular Disease

## 2023-03-08 DIAGNOSIS — I350 Nonrheumatic aortic (valve) stenosis: Secondary | ICD-10-CM | POA: Diagnosis not present

## 2023-03-08 DIAGNOSIS — N2889 Other specified disorders of kidney and ureter: Secondary | ICD-10-CM | POA: Diagnosis not present

## 2023-03-08 DIAGNOSIS — Z01818 Encounter for other preprocedural examination: Secondary | ICD-10-CM | POA: Diagnosis not present

## 2023-03-08 DIAGNOSIS — I2510 Atherosclerotic heart disease of native coronary artery without angina pectoris: Secondary | ICD-10-CM | POA: Diagnosis not present

## 2023-03-08 MED ORDER — IOHEXOL 350 MG/ML SOLN
95.0000 mL | Freq: Once | INTRAVENOUS | Status: AC | PRN
  Administered 2023-03-08: 95 mL via INTRAVENOUS

## 2023-03-08 MED ORDER — NITROGLYCERIN 0.4 MG SL SUBL
0.8000 mg | SUBLINGUAL_TABLET | Freq: Once | SUBLINGUAL | Status: AC
  Administered 2023-03-08: 0.8 mg via SUBLINGUAL

## 2023-03-08 MED ORDER — NITROGLYCERIN 0.4 MG SL SUBL
SUBLINGUAL_TABLET | SUBLINGUAL | Status: AC
  Filled 2023-03-08: qty 2

## 2023-03-10 ENCOUNTER — Encounter: Payer: Self-pay | Admitting: Podiatry

## 2023-03-10 ENCOUNTER — Ambulatory Visit (INDEPENDENT_AMBULATORY_CARE_PROVIDER_SITE_OTHER): Payer: Medicare Other | Admitting: Podiatry

## 2023-03-10 DIAGNOSIS — B351 Tinea unguium: Secondary | ICD-10-CM | POA: Diagnosis not present

## 2023-03-10 DIAGNOSIS — L84 Corns and callosities: Secondary | ICD-10-CM | POA: Diagnosis not present

## 2023-03-11 ENCOUNTER — Telehealth: Payer: Self-pay | Admitting: Cardiovascular Disease

## 2023-03-11 ENCOUNTER — Other Ambulatory Visit: Payer: Self-pay | Admitting: Cardiovascular Disease

## 2023-03-11 NOTE — Telephone Encounter (Signed)
Informed patient that Dr [name] is using the CT results to continue with progress trial plan, but that when I had clear direction for her and interpretation of CT and next steps, we would call her. Pt understands.

## 2023-03-11 NOTE — Telephone Encounter (Signed)
Patient would like the nurse to give her call about there CT scan. Please advise

## 2023-03-11 NOTE — Progress Notes (Signed)
Subjective:   Patient ID: [name], female   DOB: 82 y.o.   MRN: [id number]   HPI Patient presents with several different concerns and problems with 1 being nail disease with discoloration and thought about removal and secondarily calluses that formed on the bottom of the feet that are bothersome when pressed.  States they are new   ROS      Objective:  Physical Exam  Neurovascular status intact muscle strength adequate with patient found to have discoloration hallux toenails of both feet with abnormal growth also occurring.  Patient has good digital perfusion does also have plantar callus subfourth metatarsal right moderately tender when pressed and thick currently     Assessment:  Several different issues with nail disease lesion formation with moderate discomfort present     Plan:  H&P reviewed and I do think the nails are more trauma versus fungus do not recommend current treatment and advised on painting the nails and keeping them trimmed.  Debrided plantar lesion this can be done as needed and if it were to get worse we can consider more aggressive treatments that we discussed

## 2023-03-24 ENCOUNTER — Other Ambulatory Visit: Payer: Self-pay | Admitting: Internal Medicine

## 2023-04-22 ENCOUNTER — Other Ambulatory Visit: Payer: Self-pay | Admitting: Internal Medicine

## 2023-05-03 DIAGNOSIS — H04123 Dry eye syndrome of bilateral lacrimal glands: Secondary | ICD-10-CM | POA: Diagnosis not present

## 2023-05-03 DIAGNOSIS — H353131 Nonexudative age-related macular degeneration, bilateral, early dry stage: Secondary | ICD-10-CM | POA: Diagnosis not present

## 2023-05-03 DIAGNOSIS — Z961 Presence of intraocular lens: Secondary | ICD-10-CM | POA: Diagnosis not present

## 2023-05-03 DIAGNOSIS — H5203 Hypermetropia, bilateral: Secondary | ICD-10-CM | POA: Diagnosis not present

## 2023-05-23 ENCOUNTER — Other Ambulatory Visit: Payer: Self-pay | Admitting: Cardiovascular Disease

## 2023-06-02 ENCOUNTER — Emergency Department (HOSPITAL_COMMUNITY)
Admission: EM | Admit: 2023-06-02 | Discharge: 2023-06-02 | Disposition: A | Discharge status: Home / Self Care | Payer: Medicare Other | Attending: Emergency Medicine | Admitting: Emergency Medicine

## 2023-06-02 ENCOUNTER — Other Ambulatory Visit: Payer: Self-pay

## 2023-06-02 ENCOUNTER — Emergency Department (HOSPITAL_COMMUNITY): Payer: Medicare Other

## 2023-06-02 DIAGNOSIS — E039 Hypothyroidism, unspecified: Secondary | ICD-10-CM | POA: Insufficient documentation

## 2023-06-02 DIAGNOSIS — Z853 Personal history of malignant neoplasm of breast: Secondary | ICD-10-CM | POA: Diagnosis not present

## 2023-06-02 DIAGNOSIS — I2510 Atherosclerotic heart disease of native coronary artery without angina pectoris: Secondary | ICD-10-CM | POA: Diagnosis not present

## 2023-06-02 DIAGNOSIS — R531 Weakness: Secondary | ICD-10-CM | POA: Diagnosis not present

## 2023-06-02 DIAGNOSIS — Z7982 Long term (current) use of aspirin: Secondary | ICD-10-CM | POA: Diagnosis not present

## 2023-06-02 DIAGNOSIS — I350 Nonrheumatic aortic (valve) stenosis: Secondary | ICD-10-CM | POA: Diagnosis not present

## 2023-06-02 DIAGNOSIS — Z79899 Other long term (current) drug therapy: Secondary | ICD-10-CM | POA: Diagnosis not present

## 2023-06-02 DIAGNOSIS — I5032 Chronic diastolic (congestive) heart failure: Secondary | ICD-10-CM

## 2023-06-02 DIAGNOSIS — R5383 Other fatigue: Secondary | ICD-10-CM | POA: Diagnosis not present

## 2023-06-02 DIAGNOSIS — I700 Atherosclerosis of aorta: Secondary | ICD-10-CM | POA: Diagnosis not present

## 2023-06-02 DIAGNOSIS — I10 Essential (primary) hypertension: Secondary | ICD-10-CM | POA: Diagnosis not present

## 2023-06-02 DIAGNOSIS — R0602 Shortness of breath: Secondary | ICD-10-CM | POA: Insufficient documentation

## 2023-06-02 DIAGNOSIS — R918 Other nonspecific abnormal finding of lung field: Secondary | ICD-10-CM | POA: Diagnosis not present

## 2023-06-02 DIAGNOSIS — I110 Hypertensive heart disease with heart failure: Secondary | ICD-10-CM | POA: Diagnosis not present

## 2023-06-02 LAB — CBC WITH DIFFERENTIAL/PLATELET
Abs Immature Granulocytes: 0.01 10*3/uL (ref 0.00–0.07)
Basophils Absolute: 0 10*3/uL (ref 0.0–0.1)
Basophils Relative: 1 %
Eosinophils Absolute: 0.3 10*3/uL (ref 0.0–0.5)
Eosinophils Relative: 5 %
HCT: 44.8 % (ref 36.0–46.0)
Hemoglobin: 14 g/dL (ref 12.0–15.0)
Immature Granulocytes: 0 %
Lymphocytes Relative: 27 %
Lymphs Abs: 1.4 10*3/uL (ref 0.7–4.0)
MCH: 29.8 pg (ref 26.0–34.0)
MCHC: 31.3 g/dL (ref 30.0–36.0)
MCV: 95.3 fL (ref 80.0–100.0)
Monocytes Absolute: 0.5 10*3/uL (ref 0.1–1.0)
Monocytes Relative: 10 %
Neutro Abs: 3 10*3/uL (ref 1.7–7.7)
Neutrophils Relative %: 57 %
Platelets: 241 10*3/uL (ref 150–400)
RBC: 4.7 MIL/uL (ref 3.87–5.11)
RDW: 14.1 % (ref 11.5–15.5)
WBC: 5.2 10*3/uL (ref 4.0–10.5)
nRBC: 0 % (ref 0.0–0.2)

## 2023-06-02 LAB — COMPREHENSIVE METABOLIC PANEL
ALT: 18 U/L (ref 0–44)
AST: 30 U/L (ref 15–41)
Albumin: 3.4 g/dL — ABNORMAL LOW (ref 3.5–5.0)
Alkaline Phosphatase: 58 U/L (ref 38–126)
Anion gap: 10 (ref 5–15)
BUN: 12 mg/dL (ref 8–23)
CO2: 19 mmol/L — ABNORMAL LOW (ref 22–32)
Calcium: 8.8 mg/dL — ABNORMAL LOW (ref 8.9–10.3)
Chloride: 104 mmol/L (ref 98–111)
Creatinine, Ser: 0.71 mg/dL (ref 0.44–1.00)
GFR, Estimated: >60 mL/min (ref >60)
Glucose, Bld: 81 mg/dL (ref 70–99)
Potassium: 4.3 mmol/L (ref 3.5–5.1)
Sodium: 133 mmol/L — ABNORMAL LOW (ref 135–145)
Total Bilirubin: 1.5 mg/dL — ABNORMAL HIGH (ref 0.3–1.2)
Total Protein: 6.2 g/dL — ABNORMAL LOW (ref 6.5–8.1)

## 2023-06-02 LAB — TROPONIN I (HIGH SENSITIVITY)
Troponin I (High Sensitivity): 9 ng/L (ref <18)
Troponin I (High Sensitivity): 11 ng/L (ref <18)

## 2023-06-02 LAB — MAGNESIUM: Magnesium: 2.2 mg/dL (ref 1.7–2.4)

## 2023-06-02 LAB — BRAIN NATRIURETIC PEPTIDE: B Natriuretic Peptide: 224.4 pg/mL — ABNORMAL HIGH (ref 0.0–100.0)

## 2023-06-02 LAB — TSH: TSH: 2.471 u[IU]/mL (ref 0.350–4.500)

## 2023-06-02 NOTE — Discharge Instructions (Signed)
You were seen in the emergency room today with weakness.  Please keep your follow-up appoint with cardiology and follow with them as scheduled.

## 2023-06-02 NOTE — ED Triage Notes (Signed)
Pt to the ed from home via ems where we she lives alone with a CC of weakness x 2 weeks. Pt relays sob with headache. Pt relays large cardiac hx and is suppose  to have heart valve replaced Monday. Pt relays edema to lower extremities with frequent urination. Pt denies cp, dizziness currently.

## 2023-06-02 NOTE — ED Provider Notes (Signed)
[place] [facility]ENC[facility] DEPART[facility] AT [facility] Provider Note   CSN: [id number] Arrival date & time: 06/02/23  2130     History  Chief Complaint  Patient presents with   Weakness    [name] is a 82 y.o. female.  The history is provided by the patient and medical records.  Weakness Severity:  Severe Onset quality:  Gradual Duration:  2 weeks Timing:  Constant Chronicity:  New Relieved by:  Nothing Worsened by:  Nothing Ineffective treatments:  None tried Associated symptoms: frequency and shortness of breath   Associated symptoms: no abdominal pain, no aphasia, no chest pain, no cough, no diarrhea, no numbness in extremities, no falls, no fever, no foul-smelling urine, no loss of consciousness, no nausea, no near-syncope, no vision change and no vomiting        Home Medications Prior to Admission medications   Medication Sig Start Date End Date Taking? Authorizing Provider  aspirin 81 MG tablet Take 81 mg by mouth daily.      [provider]  atorvastatin (LIPITOR) 20 MG tablet TAKE 1 TABLET BY MOUTH EVERY DAY 01/12/23   Pincus Sanes, MD  Calcium Carbonate (CALTRATE 600 PO) Take 1 tablet by mouth 2 (two) times daily.      [provider]  citalopram (CELEXA) 20 MG tablet TAKE 1 TABLET BY MOUTH EVERY DAY 11/03/22   Pincus Sanes, MD  empagliflozin (JARDIANCE) 10 MG TABS tablet Take 1 tablet (10 mg total) by mouth daily before breakfast. 08/23/22   Tonny Bollman, MD  famotidine (PEPCID) 40 MG tablet Take 1 tablet (40 mg total) by mouth daily. 02/02/23   Pincus Sanes, MD  fluticasone (FLONASE) 50 MCG/ACT nasal spray Place 1 spray into both nostrils daily as needed for allergies or rhinitis. 10/12/22   Pincus Sanes, MD  furosemide (LASIX) 20 MG tablet TAKE 1 TABLET BY MOUTH EVERY DAY 05/24/23   Tonny Bollman, MD  levothyroxine (SYNTHROID) 75 MCG tablet TAKE 1 TABLET EVERY MORNING BEFORE BREAKFAST 02/02/23   Pincus Sanes, MD  losartan  (COZAAR) 50 MG tablet TAKE 1 TABLET BY MOUTH EVERY DAY 03/11/23   Tonny Bollman, MD  meloxicam (MOBIC) 7.5 MG tablet TAKE 1/2 TO 1 TABLET EVERY DAY 04/22/23   Pincus Sanes, MD  metoprolol tartrate (LOPRESSOR) 25 MG tablet Take 1 tablet (25 mg total) by mouth 2 (two) times daily. 02/02/23   Pincus Sanes, MD  MULTIPLE VITAMIN PO Take 1 tablet by mouth daily.    [provider]  Multiple Vitamins-Minerals (MULTIVITAMIN) LIQD  11/05/21   [provider]  senna-docusate (SENOKOT-S) 8.6-50 MG per tablet Take 1 tablet by mouth daily.      [provider]  VITAMIN D, CHOLECALCIFEROL, PO Take by mouth.    [provider]  zolpidem (AMBIEN) 5 MG tablet Take 1 tablet (5 mg total) by mouth at bedtime as needed. for sleep 02/02/23   Pincus Sanes, MD      Allergies    Clarithromycin, Clindamycin, Codeine, Levofloxacin, Morphine, Naproxen, Omeprazole, Penicillins, and Pneumococcal vaccines    Review of Systems   Review of Systems  Constitutional:  Positive for fatigue. Negative for chills and fever.  HENT:  Negative for congestion.   Eyes:  Negative for visual disturbance.  Respiratory:  Positive for chest tightness and shortness of breath. Negative for cough and wheezing.   Cardiovascular:  Negative for chest pain, palpitations, leg swelling and near-syncope.  Gastrointestinal:  Negative for abdominal pain, constipation, diarrhea, nausea and vomiting.  Genitourinary:  Positive for frequency.  Musculoskeletal:  Negative for back pain, falls, neck pain and neck stiffness.  Skin:  Negative for rash and wound.  Neurological:  Positive for weakness. Negative for loss of consciousness.  Psychiatric/Behavioral:  Negative for agitation.   All other systems reviewed and are negative.   Physical Exam Updated Vital Signs BP (!) 176/67   Pulse 63   Temp 97.7 F (36.5 C)   Resp 18   SpO2 97%  Physical Exam Vitals and nursing note reviewed.  Constitutional:       General: She is not in acute distress.    Appearance: She is well-developed. She is not ill-appearing, toxic-appearing or diaphoretic.  HENT:     Head: Normocephalic and atraumatic.     Nose: No congestion or rhinorrhea.     Mouth/Throat:     Mouth: Mucous membranes are moist.     Pharynx: No oropharyngeal exudate or posterior oropharyngeal erythema.  Eyes:     Extraocular Movements: Extraocular movements intact.     Conjunctiva/sclera: Conjunctivae normal.     Pupils: Pupils are equal, round, and reactive to light.  Cardiovascular:     Rate and Rhythm: Normal rate and regular rhythm.     Heart sounds: Murmur heard.  Pulmonary:     Effort: Pulmonary effort is normal. No respiratory distress.     Breath sounds: Normal breath sounds. No stridor. No wheezing, rhonchi or rales.  Chest:     Chest wall: No tenderness.  Abdominal:     General: Abdomen is flat.     Palpations: Abdomen is soft.     Tenderness: There is no abdominal tenderness. There is no right CVA tenderness, left CVA tenderness, guarding or rebound.  Musculoskeletal:        General: No swelling or tenderness.     Cervical back: Neck supple.     Right lower leg: No edema.     Left lower leg: No edema.  Skin:    General: Skin is warm and dry.     Capillary Refill: Capillary refill takes less than 2 seconds.     Findings: No erythema or rash.  Neurological:     General: No focal deficit present.     Mental Status: She is alert.     Sensory: No sensory deficit.     Motor: No weakness.  Psychiatric:        Mood and Affect: Mood normal.     ED Results / Procedures / Treatments   Labs (all labs ordered are listed, but only abnormal results are displayed) Labs Reviewed  COMPREHENSIVE METABOLIC PANEL - Abnormal; Notable for the following components:      Result Value   Sodium 133 (*)    CO2 19 (*)    Calcium 8.8 (*)    Total Protein 6.2 (*)    Albumin 3.4 (*)    Total Bilirubin 1.5 (*)    All other components  within normal limits  BRAIN NATRIURETIC PEPTIDE - Abnormal; Notable for the following components:   B Natriuretic Peptide 224.4 (*)    All other components within normal limits  CBC WITH DIFFERENTIAL/PLATELET  MAGNESIUM  TSH  TROPONIN I (HIGH SENSITIVITY)  TROPONIN I (HIGH SENSITIVITY)    EKG EKG Interpretation Date/Time:  Thursday June 02 2023 09:31:12 EDT Ventricular Rate:  59 PR Interval:  200 QRS Duration:  86 QT Interval:  417 QTC Calculation: 414 R Axis:  54  Text Interpretation: Sinus rhythm Probable left atrial enlargement Anterior infarct, old when compared to prior, similar appearnce. No STEMI Confirmed by Theda Belfast ([id number]) on 06/02/2023 9:54:20 AM  Radiology DG Chest 2 View  Result Date: 06/02/2023 CLINICAL DATA:  Fatigue, exertional shortness of breath, no energy. Concern for symptomatic aortic stenosis. EXAM: CHEST - 2 VIEW COMPARISON:  CT 03/08/2023 and previous FINDINGS: Linear pleural based opacity in the lingula increased since prior study. Right lung clear. No pneumothorax. Heart size and mediastinal contours are within normal limits. CABG marker. Aortic Atherosclerosis (ICD10-170.0). No effusion. Sternotomy wires.  Surgical clips left axilla. IMPRESSION: Increasing lingular opacity, possibly atelectasis. Electronically Signed   By: Corlis Leak M.D.   On: 06/02/2023 10:56    Procedures Procedures    Medications Ordered in ED Medications - No data to display  ED Course/ Medical Decision Making/ A&P                             Medical Decision Making Amount and/or Complexity of Data Reviewed Labs: ordered. Radiology: ordered.    [name] is a 82 y.o. female with a past medical history significant for hypertension, hyperlipidemia, CAD status post CABG, aortic stenosis scheduled for consultation on Monday with Dr. [name] with cardiology for possible TAVR, remote DVT that patient does not remember, GERD, carotid disease, and CHF who presents with  fatigue, lightheadedness, exertional shortness of breath, and no energy.  According to patient, she is scheduled to talk to Dr. [name] on Monday to discuss possible TAVR for known aortic stenosis.  She says that over the last week or 2 she has had worsening fatigue and now can barely stand up and ambulate.  She says that she feels lightheaded like she could pass out.  She reports she is feeling a "whooshing sound" in her head.  She reports no focal neurologic deficits with no focal numbness, tingling, or unilateral weakness.  She denies nausea, vomiting, constipation, or diarrhea.  She reports chronic urinary incontinence and frequency but denies dysuria.  She denies new pain or swelling and is denying chest pain.  She denies fevers, chills, congestion, or cough.  No reported trauma.  Denies other complaints on arrival.  On exam, lungs are overall clear.  No wheezing.  No rhonchi.  Chest does have a loud murmur.  Abdomen nontender.  She has intact pulses in extremities.  Legs nontender nonedematous.  No focal neurologic deficits initially.  Intact sensation strength and pulses.  EKG does not show STEMI.  Clinically I am concerned about this patient's worsening fatigue in the context of aortic stenosis.  Will get screening labs but will call cardiology as she may now be more symptomatic with this aortic stenosis.  Anticipate she may need echo and further evaluation and management.   Cardiology will come see patient and we will wait for workup to determine disposition.  Care transferred to oncoming team to wait for cardiology recommendations.        Final Clinical Impression(s) / ED Diagnoses Final diagnoses:  Fatigue, unspecified type  Exertional shortness of breath     Clinical Impression: 1. Fatigue, unspecified type   2. Exertional shortness of breath     Disposition: Care transferred to oncoming team to await for cardiology recommendations for the exertional shortness of breath  fatigue and aortic stenosis.  This note was prepared with assistance of Conservation officer, historic buildings. Occasional wrong-word or sound-a-like substitutions may  have occurred due to the inherent limitations of voice recognition software.     Shelbia Scinto, Canary Brim, MD 06/02/23 [phone]

## 2023-06-02 NOTE — Consult Note (Addendum)
Cardiology Consult   Patient ID: [name] MRN: [id number]; DOB: [birth year]   Admission date: 06/02/2023  PCP:  Angelica Sanes, MD   [facility] Providers Cardiologist:  Tonny Bollman, MD   {  Chief Complaint:  "I was worried that I was having a stroke"  Patient Profile:   [name] is a 82 y.o. female with a history of CAD s/p CABG x5 in 2007, chronic diastolic CHF, moderate to severe aortic stenosis, hypertension, hyperlipidemia, hypothyroidism, prior DVT, GERD with erosive gastritis, recurrent sinusitis, and remote breast cancer s/p bilateral mastectomy in 1991 who presented to the ED today with multiple complaints and concerns that she was having a stroke. Cardiology consulted for further evaluation of shortness of breath in setting of moderate to severe aortic stenosis at the request of Dr. Rush Landmark.  History of Present Illness:   [name] is a 82 year old female with the above history who is followed by Dr. [name]. She has a history of CAD s/p remote CABG x5 in 2007. Cardiac catheterization in 2011 showed all grafts were patent. She has had multiple stress test over the years. Last Myoview in 12/2021 was low risk with no evidence of ischemia. She also has known aortic stenosis which has been followed closely with serial Echo studies. Last Echo in 02/2023 showed LVEF of 60-65% with no regional wall motion abnormalities, mild LVH, and grade 3 diastolic dysfunction and elevated LVEDP as well as low normal RV function with moderately elevated PASP and progression of aortic stenosis to moderate to severe range (mean gradient 25.0 mmHg, peak gradient 44 mmHg, and valve area by VTI of 0.72 cm^2). She was seen by Dr. [name] shortly after this at which time she continued to describe exertional dyspnea and fatigue. She was felt to be a good candidate for TAVR. CTA was ordered and valve anatomy was felt to be suitable for a 23 mm Edwards SAPIEN 3 valve. Plan was to submit her data for  review of the continued access arm of the PROGRESS trial.  Patient now presents to the ED with multiple symptoms.  She states she actually came in because she was nervous she was having a stroke.  She describes a weird sensation feeling like the blood is coming up from her feet to her head then she will get pain on the left side of her head and hear a whooshing sensation.  She is unable to tell me how long this lasts for but it has been occurring intermittently for the past several months.  She had a brain MRI in 12/23 for the same symptoms which showed no acute findings but did show chronic small vessel ischemic changes as well as a tiny chronic infarct within the left cerebellar hemisphere and chronic lacunar infarct within the posterior right centrum semiovale.  MRI also showed paranasal sinus disease with complete opacification of the left frontal sinus.  Was seen by neurology after this for auditory hallucinations and plan was for EEG and paraneoplastic panel.  EEG was normal.  She denies any worsening of her symptoms since then.  She has no other strokelike symptoms including dysphagia, new vision changes, facial droop, unilateral weakness/numbness.  She states she just feels "weird."  She also reports continued dyspnea on exertion and fatigue but states this is unchanged from when she saw Dr. [name] in 02/2023.  She denies any chest pain, orthopnea, PND, lower extremity edema.  She does report pretty consistent lightheadedness that is not  different with position changes but denies any palpitations or syncope.  She also has had profound weakness over the last few months but no acute worsening of this.  She reports sometimes her feet feet will intermittently turn black but she denies any claudication. Posterior tibial and distal pedal pulses present on exam.  Upon arrival to the ED, patient hypertensive but vitals table. EKG showed normal sinus rhythm with no acute ischemic changes compared to prior EKG.  High-sensitivity troponin negative x2. BNP mildly elevated at 224. Chest x-ray showed increasing lingular opacity (possibly atelectasis). WBC 6.4, Hgb 13.8, Plts 267. Na 135, K 4.3, Glucose 75, BUN 14, Cr 0.71. LFTs normal. TSH normal. Cardiology consulted for further evaluation.   Past Medical History:  Diagnosis Date   ALLERGIC RHINITIS    Allergy    SEASONAL   Anxiety    Aortic stenosis, moderate    Blood transfusion without reported diagnosis    BREAST CANCER 1991   left, 1991; B mastectomy   Cataract    BILATERAL-REMOVED   Coronary artery disease    s/p CABGx5 2007 //Nuclear stress test 10/18: EF 76, no ischemia or infarction, low risk   Depression    DIVERTICULOSIS    DVT    EROSIVE GASTRITIS    GERD    Heart murmur    History of DVT (deep vein thrombosis)    HYPERLIPIDEMIA    Hypertension    HYPOTHYROIDISM    Occlusion and stenosis of carotid artery without mention of cerebral infarction    Osteopenia    OSTEOPOROSIS    RHINOSINUSITIS, RECURRENT     Past Surgical History:  Procedure Laterality Date   APPENDECTOMY     COLONOSCOPY     CORONARY ARTERY BYPASS GRAFT     5        2007   MASTECTOMY     left with reconstruction and lymph node excision  1992   MASTECTOMY     right with reconstruction   REVISION RECONSTRUCTED BREAST Left 02/2014   willard (plastics in [place])   right ankle arthroscopy     TEAR DUCT CANAL     TONSILLECTOMY AND ADENOIDECTOMY     UPPER GASTROINTESTINAL ENDOSCOPY     VAGINAL HYSTERECTOMY     ovaries not excised     Medications Prior to Admission: Prior to Admission medications   Medication Sig Start Date End Date Taking? Authorizing Provider  atorvastatin (LIPITOR) 20 MG tablet TAKE 1 TABLET BY MOUTH EVERY DAY Patient taking differently: Take 20 mg by mouth See admin instructions. TAKE 1 TABLET BY MOUTH EVERY DAY 01/12/23  Yes Burns, Bobette Mo, MD  aspirin 81 MG tablet Take 81 mg by mouth daily.      [provider]  Calcium  Carbonate (CALTRATE 600 PO) Take 1 tablet by mouth 2 (two) times daily.      [provider]  citalopram (CELEXA) 20 MG tablet TAKE 1 TABLET BY MOUTH EVERY DAY Patient not taking: Reported on 06/02/2023 11/03/22   Angelica Sanes, MD  empagliflozin (JARDIANCE) 10 MG TABS tablet Take 1 tablet (10 mg total) by mouth daily before breakfast. 08/23/22   Tonny Bollman, MD  famotidine (PEPCID) 40 MG tablet Take 1 tablet (40 mg total) by mouth daily. 02/02/23   Angelica Sanes, MD  fluticasone (FLONASE) 50 MCG/ACT nasal spray Place 1 spray into both nostrils daily as needed for allergies or rhinitis. 10/12/22   Angelica Sanes, MD  furosemide (LASIX) 20 MG tablet  TAKE 1 TABLET BY MOUTH EVERY DAY 05/24/23   Tonny Bollman, MD  levothyroxine (SYNTHROID) 75 MCG tablet TAKE 1 TABLET EVERY MORNING BEFORE BREAKFAST Patient taking differently: Take 75 mcg by mouth See admin instructions. TAKE 1 TABLET EVERY MORNING BEFORE BREAKFAST 02/02/23   Angelica Sanes, MD  losartan (COZAAR) 50 MG tablet TAKE 1 TABLET BY MOUTH EVERY DAY 03/11/23   Tonny Bollman, MD  meloxicam (MOBIC) 7.5 MG tablet TAKE 1/2 TO 1 TABLET EVERY DAY Patient taking differently: Take 7.5 mg by mouth daily. 04/22/23   Angelica Sanes, MD  metoprolol tartrate (LOPRESSOR) 25 MG tablet Take 1 tablet (25 mg total) by mouth 2 (two) times daily. 02/02/23   Angelica Sanes, MD  MULTIPLE VITAMIN PO Take 1 tablet by mouth daily.    [provider]  Multiple Vitamins-Minerals (MULTIVITAMIN) LIQD  11/05/21   [provider]  senna-docusate (SENOKOT-S) 8.6-50 MG per tablet Take 1 tablet by mouth daily.      [provider]  VITAMIN D, CHOLECALCIFEROL, PO Take 1 tablet by mouth daily.    [provider]  zolpidem (AMBIEN) 5 MG tablet Take 1 tablet (5 mg total) by mouth at bedtime as needed. for sleep Patient taking differently: Take 5 mg by mouth at bedtime as needed for sleep. 02/02/23   Angelica Sanes, MD     Allergies:     Allergies  Allergen Reactions   Clarithromycin Other (See Comments)    PATIENT UNSURE OF REACTION   Clindamycin Other (See Comments)   Codeine     REACTION: nausea   Levofloxacin     REACTION: nausea   Morphine Other (See Comments)    Nausea  Nausea    Naproxen     REACTION: nausea   Omeprazole Other (See Comments)    Made her jittery, made her feel weird, ?rash   Penicillins Other (See Comments) and Rash    REACTION: RASH REACTION: RASH    Pneumococcal Vaccines Rash    Small rash on arm at injection site.     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, school psychologist   Right handed    Social Determinants of Health   Financial Resource Strain: Low Risk  (09/02/2022)   Overall Financial Resource Strain (CARDIA)    Difficulty of Paying Living Expenses: Not hard at all  Food Insecurity: No Food Insecurity (09/02/2022)   Hunger Vital Sign    Worried About Running Out of Food in the Last Year: Never true    Ran Out of Food in the Last Year: Never true  Transportation Needs: No Transportation Needs (09/02/2022)   PRAPARE - Administrator, Civil Service (Medical): No    Lack of Transportation (Non-Medical): No  Physical Activity: Inactive (09/02/2022)   Exercise Vital Sign    Days of Exercise per Week: 0 days    Minutes of Exercise per Session: 0 min   Stress: No Stress Concern Present (09/02/2022)   [employer] of Occupational Health - Occupational Stress Questionnaire    Feeling of Stress : Not at all  Social Connections: Moderately Integrated (09/02/2022)   Social Connection and Isolation Panel [NHANES]    Frequency of Communication with Friends and Family: More than three times a week    Frequency of Social Gatherings with Friends and Family: More than three times a week    Attends Religious Services: More than 4 times per year    Active Member of [employer] or Organizations: Yes    Attends Engineer, structural: More than 4 times per year    Marital Status: Divorced  Intimate Partner Violence: Not At Risk (09/02/2022)   Humiliation, Afraid, Rape, and Kick questionnaire    Fear of Current or Ex-Partner: No    Emotionally Abused: No    Physically Abused: No    Sexually Abused: No    Family History:   The patient's family history includes Breast cancer in her mother; Colon cancer in her sister and sister; Emphysema in her father; Lung disease in her mother. There is no history of Esophageal cancer, Rectal cancer, Stomach cancer, or Thyroid disease.    ROS:  Please see the history of present illness.  All other ROS reviewed and negative.     Physical Exam/Data:   Vitals:   06/02/23 0946 06/02/23 1300 06/02/23 1415 06/02/23 1545  BP:  (!) 147/87 (!) 164/63 (!) 174/74  Pulse:  61 63 71  Resp:  13 16 14   Temp:      SpO2:  96% 97% 97%  Weight: 57 kg     Height: 5\' 2"  (1.575 m)      No intake or output data in the 24 hours ending 06/02/23 1605    06/02/2023    9:46 AM 02/28/2023    2:22 PM 02/02/2023    2:33 PM  Last 3 Weights  Weight (lbs) 125 lb 10.6 oz 126 lb 3.2 oz 123 lb  Weight (kg) 57 kg 57.244 kg 55.792 kg     Body mass index is 22.98 kg/m.  General: 82 y.o. frail Caucasian female resting comfortably in no acute distress. HEENT: Normocephalic and atraumatic. Sclera clear.  Neck: Supple. Bilateral  carotid bruits noted on exam but suspect radiation from aortic stenosis. No JVD. Heart: RRR. III/VI systolic murmur. Radial pulses 2+ and equal bilaterally. Posterior tibial and distal pedal pulses 1+ and equal bilaterally. Lungs: No increased work of breathing. Clear to ausculation bilaterally. No wheezes, rhonchi, or rales.  Abdomen: Soft, non-distended, and non-tender to palpation. Extremities: No cyanosis or lower extremity edema.    Skin: Warm and dry. Neuro: Alert and oriented x3. Generalized weakness but no focal deficits. Psych: Normal affect. Responds appropriately.    EKG:  The ECG that was done was personally reviewed and demonstrates normal sinus rhythm with no acute ischemic changes compared to prior tracing.  Telemetry: Telemetry personally reviewed and demonstrates normal sinus rhythm with rates in the 60s.  Relevant CV Studies:  Echocardiogram 02/23/2023: Impressions:  1. Left ventricular ejection fraction, by estimation, is 60 to 65%. Left  ventricular ejection fraction by PLAX is 64 %. The left ventricle has  normal function. The left ventricle has no regional wall motion  abnormalities. There is mild left ventricular  hypertrophy. Left ventricular diastolic parameters are consistent with  Grade III diastolic dysfunction (restrictive). Elevated left ventricular  end-diastolic pressure.   2. Right ventricular systolic function is low normal. The right  ventricular size is normal. There is moderately elevated pulmonary artery  systolic pressure. The estimated right ventricular systolic pressure is  51.6 mmHg.   3. Left atrial size was moderately dilated.   4. The mitral valve is degenerative. Trivial mitral valve regurgitation.   5. The aortic valve is calcified. There is severe calcifcation of the  aortic valve. Aortic valve regurgitation is trivial. Moderate to severe  aortic valve stenosis. Aortic regurgitation PHT measures 572 msec. Aortic  valve area, by VTI  measures 0.72 cm.   Aortic valve mean gradient measures 25.0 mmHg. Aortic valve Vmax measures  3.32 m/s. Peak gradient 44 mmHg, DI 0.32.   6. The pulmonic valve was abnormal.   7. Aortic dilatation noted. There is borderline dilatation of the  ascending aorta, measuring 39 mm.   8. The inferior vena cava is normal in size with <50% respiratory  variability, suggesting right atrial pressure of 8 mmHg.   Comparison(s): Changes from prior study are noted. 07/23/2022: LVEF 60-65%,  mild to moderate AS - mean gradient 19 mmHg.  atherosclerotic heart disease _______________  Coronary CTA 03/08/2023: Impressions: 1. Functionally bicuspid AV with fused right and left cusp score 874 2. Annular area of 353 mm2 suitable for a 23 mm Sapien 3 valve Alternatively a 26 Medtronic Evolut valve would fit 3.  Optimum angiographic angle for deployment LAO 7 Caudal 6 degrees 4.  Coronary arteries sufficient height above annulus for deployment 5.  There are 2 patent SVGls and a patent LIMA 6.  Membranous septal length 9.5 mm  Laboratory Data:  High Sensitivity Troponin:   Recent Labs  Lab 06/02/23 0943 06/02/23 1240  TROPONINIHS 9 11      Chemistry Recent Labs  Lab 06/02/23 0943  NA 133*  K 4.3  CL 104  CO2 19*  GLUCOSE 81  BUN 12  CREATININE 0.71  CALCIUM 8.8*  MG 2.2  GFRNONAA >60  ANIONGAP 10    Recent Labs  Lab 06/02/23 0943  PROT 6.2*  ALBUMIN 3.4*  AST 30  ALT 18  ALKPHOS 58  BILITOT 1.5*   Lipids No results for input(s): "CHOL", "TRIG", "HDL", "LABVLDL", "LDLCALC", "CHOLHDL" in the last 168 hours. Hematology Recent Labs  Lab 06/02/23 0943  WBC 5.2  RBC 4.70  HGB 14.0  HCT 44.8  MCV 95.3  MCH 29.8  MCHC 31.3  RDW 14.1  PLT 241   Thyroid  Recent Labs  Lab 06/02/23 0943  TSH 2.471   BNP Recent Labs  Lab 06/02/23 0943  BNP 224.4*    DDimer No results for input(s): "DDIMER" in the last 168 hours.   Radiology/Studies:  DG Chest 2 View  Result Date:  06/02/2023 CLINICAL DATA:  Fatigue, exertional shortness of breath, no energy. Concern for symptomatic aortic stenosis. EXAM: CHEST - 2 VIEW COMPARISON:  CT 03/08/2023 and previous FINDINGS: Linear pleural based opacity in the lingula increased since prior study. Right lung clear. No pneumothorax. Heart size and mediastinal contours are within normal limits. CABG marker. Aortic Atherosclerosis (ICD10-170.0). No effusion. Sternotomy wires.  Surgical clips left axilla. IMPRESSION: Increasing lingular opacity, possibly atelectasis. Electronically Signed   By: Corlis Leak M.D.   On: 06/02/2023 10:56     Assessment and Plan:   Moderate to Severe Aortic Stenosis Patient presented to the  ED with multiple complaints and concerns that she was having a stroke. Please see detail in HPI above. However, she has had these symptoms for several months and they are unchanged. Cardiology was consulted because reports of shortness of breath and known moderate to severe aortic stenosis. Echo in 02/2023 showed progression of aortic stenosis to moderate to severe range (mean gradient 25.0 mmHg, peak gradient 44 mmHg, and valve area by VTI of 0.72 cm^2). However, dyspnea on exertion and fatigue are unchanged since she last saw Dr. [name] in 02/2023. She has already had pre-TAVR CTA and is felt to be a TAVR candidate. She has a follow-up appointment with Dr. [name] next Monday. Given symptoms unchanged, I think she is okay to be discharged from the ED and follow-up with Dr. [name] on Monday. Will review with MD.  Chronic Diastolic CHF Echo in 04/2023 showed LVEF of 60-65% with grade 3 diastolic dysfunction and elevated LVEDP. BNP mildly elevated in the 200s. Chest x-ray increasing lingular opacity. Euvolemic on exam. Continue Lasix 20mg  daily.  CAD s/p CABG  History of CABG x5 in 2007. Myoview in 12/2021 was low risk with no evidence of ischemia. Pre-TAVR CTA showed 2 SVG and LIMA to LAD were patent. EKG shows no acute ischemic  changes and high-sensitivity troponin negative x2. No angina. Continue aspirin and statin.  Hypertension BP elevated with systolic BP in the 140s to 170s. She states BP fluctuates at home. Home medications include Losartan 50mg  daily and Lopressor 25mg  daily. Could increase Losartan if needed for additional BP control.  Hyperlipidemia Continue home Lipitor 20mg  daily.  Disposition: I don't think patient needs to be admitted from a cardiac standpoint. Chronic dyspnea on exertion and fatigue are unchanged from last office visit and she is already scheduled to see Dr. [name] for follow-up of aortic stenosis / TAVR work-up on Monday 06/06/2023.  Will review with MD.   Risk Assessment/Risk Scores:    New York Heart Association (NYHA) Functional Class NYHA Class III   For questions or updates, please contact [facility] Please consult [email or web address] for contact info under     Signed, Corrin Parker, PA-C  06/02/2023 4:05 PM   Personally seen and examined. Agree with APP above with the following comments:  Briefly 82 yo F with CAD s/p CABG in 2007, Moderate to Severe AS, Carotid artery stenosis (non severe) with concerns she was having a stroke.   At time of evaluation, patient is tired.  She does not that she has had a weird sensation in her legs with a whooshing sensation.  She is relieved that it has not progressed. ED eval unremarkable. Patient feels back to normal.  Exam notable for bilateral carotid bruit, otherwise as above.   EKG SR Tele: SR Personally reviewed relevant tests; She he has a functionally bicuspid aortic valve with carotid calcification (worse on the right) and notable blooming artifact.   Would recommend  - Discussed with patient; Planned for discharge, as she will not be able to get an inpatient TAVR - we recommend her keeping her 06/06/23 appointment to discuss TAVR procedure.  In the setting of her symptoms and NYHA class, TAVR may be  appropriate.  Riley Lam, MD FASE [facility] Cardiologist [facility]  [address] [place], [address] [place], [place] [address] [phone]  5:28 PM

## 2023-06-02 NOTE — ED Provider Notes (Signed)
Blood pressure (!) 147/87, pulse 61, temperature 97.7 F (36.5 C), resp. rate 13, height 5\' 2"  (1.575 m), weight 57 kg, SpO2 96%.  Assuming care from Dr. Rush Landmark.  In short, [name] is a 82 y.o. female with a chief complaint of Weakness .  Refer to the original H&P for additional details.  The current plan of care is to follow up after Cardiology follow up.  04:34 PM  Cardiology consulted and do not think that admission or more urgent TAVR is required.  Patient and daughter at bedside understand and would like to be discharged for continued management of symptoms at home and will keep their follow-up appointment with cardiology.  Patient appears well and stable for discharge.  Considered alternate etiologies of weakness but patient with no focal weakness or lateralizing symptoms to prompt emergent stroke evaluation.   Maia Plan, MD 06/02/23 [phone]

## 2023-06-03 ENCOUNTER — Telehealth: Payer: Self-pay | Admitting: Internal Medicine

## 2023-06-06 ENCOUNTER — Institutional Professional Consult (permissible substitution): Payer: Medicare Other | Admitting: Cardiovascular Disease

## 2023-06-06 ENCOUNTER — Other Ambulatory Visit: Payer: Self-pay | Admitting: Internal Medicine

## 2023-06-06 ENCOUNTER — Encounter: Payer: Self-pay | Admitting: Cardiovascular Disease

## 2023-06-06 ENCOUNTER — Ambulatory Visit: Payer: Medicare Other | Attending: Cardiovascular Disease | Admitting: Cardiovascular Disease

## 2023-06-06 VITALS — BP 120/80 | HR 59 | Ht 62.0 in | Wt 124.8 lb

## 2023-06-06 DIAGNOSIS — I25119 Atherosclerotic heart disease of native coronary artery with unspecified angina pectoris: Secondary | ICD-10-CM | POA: Diagnosis not present

## 2023-06-06 DIAGNOSIS — E782 Mixed hyperlipidemia: Secondary | ICD-10-CM | POA: Insufficient documentation

## 2023-06-06 DIAGNOSIS — I10 Essential (primary) hypertension: Secondary | ICD-10-CM | POA: Diagnosis not present

## 2023-06-06 DIAGNOSIS — I350 Nonrheumatic aortic (valve) stenosis: Secondary | ICD-10-CM | POA: Diagnosis not present

## 2023-06-06 NOTE — Patient Instructions (Signed)
Medication Instructions:  Your physician recommends that you continue on your current medications as directed. Please refer to the Current Medication list given to you today.  *If you need a refill on your cardiac medications before your next appointment, please call your pharmacy*  Lab Work: NONE If you have labs (blood work) drawn today and your tests are completely normal, you will receive your results only by: MyChart Message (if you have MyChart) OR A paper copy in the mail If you have any lab test that is abnormal or we need to change your treatment, we will call you to review the results.  Testing/Procedures: R & L heart Catheterization Your physician has requested that you have a cardiac catheterization. Cardiac catheterization is used to diagnose and/or treat various heart conditions. Doctors may recommend this procedure for a number of different reasons. The most common reason is to evaluate chest pain. Chest pain can be a symptom of coronary artery disease (CAD), and cardiac catheterization can show whether plaque is narrowing or blocking your heart's arteries. This procedure is also used to evaluate the valves, as well as measure the blood flow and oxygen levels in different parts of your heart. For further information please visit [email or web address] Please follow instruction sheet, as given.  Follow-Up: At [facility], you and your health needs are our priority.  As part of our continuing mission to provide you with exceptional heart care, we have created designated Provider Care Teams.  These Care Teams include your primary Cardiologist (physician) and Advanced Practice Providers (APPs -  Physician Assistants and Nurse Practitioners) who all work together to provide you with the care you need, when you need it.  Your next appointment:   Structural Team will follow-up  Provider:   Tonny Bollman, MD      Other Instructions       Cardiac/Peripheral  Catheterization   You are scheduled for a Cardiac Catheterization on Thursday, July 25 with Dr. Verne Carrow.  1. Please arrive at the [facility] (Main Entrance A) at [facility]: [address] [place], [place] [address] at 10:00 AM (This time is two hour(s) before your procedure to ensure your preparation). Free valet parking service is available. You will check in at ADMITTING. The support person will be asked to wait in the waiting room.  It is OK to have someone drop you off and come back when you are ready to be discharged.        Special note: Every effort is made to have your procedure done on time. Please understand that emergencies sometimes delay scheduled procedures.  2. Diet: Do not eat solid foods after midnight.  You may have clear liquids until 5 AM the day of the procedure.  3. Labs: NONE  4. Medication instructions in preparation for your procedure:   Contrast Allergy: No  DO NOT TAKE Furosemide the morning of procedure  On the morning of your procedure, take Aspirin 81 mg and any morning medicines NOT listed above.  You may use sips of water.  5. Plan to go home the same day, you will only stay overnight if medically necessary. 6. You MUST have a responsible adult to drive you home. 7. An adult MUST be with you the first 24 hours after you arrive home. 8. Bring a current list of your medications, and the last time and date medication taken. 9. Bring ID and current insurance cards. 10.Please wear clothes that are easy to get on and off  and wear slip-on shoes.  Thank you for allowing [place] to care for you!   -- [facility]

## 2023-06-06 NOTE — H&P (View-Only) (Signed)
Cardiology Office Note:    Date:  06/06/2023   ID:  Angelica, Mullen [birth year], MRN [id number]  PCP:  Pincus Sanes, MD   [facility] Providers Cardiologist:  Tonny Bollman, MD     Referring MD: Pincus Sanes, MD   Chief Complaint  Patient presents with   Aortic Stenosis    History of Present Illness:    [name] is a 82 y.o. female presents for follow-up of coronary artery disease and aortic stenosis.  She underwent multivessel CABG in 2007.  Cardiac catheterization in 2011 demonstrated patency of all of her bypass grafts.  She has developed progressive aortic stenosis over the past few years and presents today for follow-up evaluation.  She is here with her daughter.  When I saw her in April, she complained of exertional dyspnea and fatigue.  Her most recent echo demonstrated some progression of aortic stenosis in the moderate to severe range and we had a lengthy discussion regarding treatment options.  She elected to return today for follow-up to discuss further evaluation in the presence of her daughter.  The patient was just seen in the emergency room few days ago with symptoms of progressive fatigue and weakness, headache, and was concerned about stroke symptoms but her evaluation demonstrated no localizing neurologic deficits.  She was seen by cardiology who recommended to keep her close outpatient follow-up that was already scheduled for today.  The patient reports progressive exertional dyspnea now with low-level physical activity.  She has worsening fatigue and dizziness as well.  She occasionally has chest discomfort but this has not been related to physical exertion.  She has had a localized area over the left chest where she feels a pressure-like sensation.  She denies orthopnea or PND.  She has no leg edema.  Past Medical History:  Diagnosis Date   ALLERGIC RHINITIS    Allergy    SEASONAL   Anxiety    Aortic stenosis, moderate    Blood transfusion  without reported diagnosis    BREAST CANCER 1991   left, 1991; B mastectomy   Cataract    BILATERAL-REMOVED   Coronary artery disease    s/p CABGx5 2007 //Nuclear stress test 10/18: EF 76, no ischemia or infarction, low risk   Depression    DIVERTICULOSIS    DVT    EROSIVE GASTRITIS    GERD    Heart murmur    History of DVT (deep vein thrombosis)    HYPERLIPIDEMIA    Hypertension    HYPOTHYROIDISM    Occlusion and stenosis of carotid artery without mention of cerebral infarction    Osteopenia    OSTEOPOROSIS    RHINOSINUSITIS, RECURRENT     Past Surgical History:  Procedure Laterality Date   APPENDECTOMY     COLONOSCOPY     CORONARY ARTERY BYPASS GRAFT     5        2007   MASTECTOMY     left with reconstruction and lymph node excision  1992   MASTECTOMY     right with reconstruction   REVISION RECONSTRUCTED BREAST Left 02/2014   [facility]ard (plastics in [place])   right ankle arthroscopy     TEAR DUCT CANAL     TONSILLECTOMY AND ADENOIDECTOMY     UPPER GASTROINTESTINAL ENDOSCOPY     VAGINAL HYSTERECTOMY     ovaries not excised    Current Medications: Current Meds  Medication Sig   aspirin 81 MG tablet Take 81 mg  by mouth daily.     atorvastatin (LIPITOR) 20 MG tablet TAKE 1 TABLET BY MOUTH EVERY DAY (Patient taking differently: Take 20 mg by mouth See admin instructions. TAKE 1 TABLET BY MOUTH EVERY DAY)   Calcium Carbonate (CALTRATE 600 PO) Take 1 tablet by mouth 2 (two) times daily.     citalopram (CELEXA) 20 MG tablet TAKE 1 TABLET BY MOUTH EVERY DAY   co-enzyme Q-10 30 MG capsule Take 30 mg by mouth daily.   empagliflozin (JARDIANCE) 10 MG TABS tablet Take 1 tablet (10 mg total) by mouth daily before breakfast.   famotidine (PEPCID) 40 MG tablet Take 1 tablet (40 mg total) by mouth daily.   fluticasone (FLONASE) 50 MCG/ACT nasal spray Place 1 spray into both nostrils daily as needed for allergies or rhinitis.   furosemide (LASIX) 20 MG tablet TAKE 1 TABLET BY MOUTH  EVERY DAY   levothyroxine (SYNTHROID) 75 MCG tablet TAKE 1 TABLET BY MOUTH EVERY MORNING BEFORE BREAKFAST   losartan (COZAAR) 50 MG tablet TAKE 1 TABLET BY MOUTH EVERY DAY   meloxicam (MOBIC) 7.5 MG tablet TAKE 1/2 TO 1 TABLET EVERY DAY   metoprolol tartrate (LOPRESSOR) 25 MG tablet Take 1 tablet (25 mg total) by mouth 2 (two) times daily.   Multiple Vitamins-Minerals (MULTIVITAMIN) LIQD Take 1 tablet by mouth daily.   Multiple Vitamins-Minerals (PRESERVISION AREDS 2) CAPS Take 1 capsule by mouth 2 (two) times daily.   senna-docusate (SENOKOT-S) 8.6-50 MG per tablet Take 1 tablet by mouth daily.     VITAMIN D, CHOLECALCIFEROL, PO Take 1 tablet by mouth daily.   zolpidem (AMBIEN) 5 MG tablet Take 1 tablet (5 mg total) by mouth at bedtime as needed. for sleep (Patient taking differently: Take 5 mg by mouth at bedtime as needed for sleep.)     Allergies:   Clarithromycin, Clindamycin, Codeine, Levofloxacin, Morphine, Naproxen, Omeprazole, Penicillins, and Pneumococcal vaccines   Social History   Socioeconomic History   Marital status: Divorced    Spouse name: Not on file   Number of children: 2   Years of education: Not on file   Highest education level: Not on file  Occupational History   Occupation: retired  Tobacco Use   Smoking status: Never   Smokeless tobacco: Never  Vaping Use   Vaping status: Never Used  Substance and Sexual Activity   Alcohol use: No    Alcohol/week: 0.0 standard drinks of alcohol   Drug use: No   Sexual activity: Not Currently  Other Topics Concern   Not on file  Social History Narrative   Divorced x 3, lives alone   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, school psychologist   Right handed    Social Determinants of Health   Financial Resource Strain: Low Risk  (09/02/2022)   Overall Financial Resource Strain (CARDIA)    Difficulty of Paying Living Expenses: Not hard at all  Food Insecurity: No Food Insecurity (09/02/2022)   Hunger Vital  Sign    Worried About Running Out of Food in the Last Year: Never true    Ran Out of Food in the Last Year: Never true  Transportation Needs: No Transportation Needs (09/02/2022)   PRAPARE - Administrator, Civil Service (Medical): No    Lack of Transportation (Non-Medical): No  Physical Activity: Inactive (09/02/2022)   Exercise Vital Sign    Days of Exercise per Week: 0 days    Minutes of Exercise per Session: 0 min  Stress: No Stress Concern Present (09/02/2022)   [employer] of Occupational Health - Occupational Stress Questionnaire    Feeling of Stress : Not at all  Social Connections: Moderately Integrated (09/02/2022)   Social Connection and Isolation Panel [NHANES]    Frequency of Communication with Friends and Family: More than three times a week    Frequency of Social Gatherings with Friends and Family: More than three times a week    Attends Religious Services: More than 4 times per year    Active Member of [employer] or Organizations: Yes    Attends Engineer, structural: More than 4 times per year    Marital Status: Divorced     Family History: The patient's family history includes Breast cancer in her mother; Colon cancer in her sister and sister; Emphysema in her father; Lung disease in her mother. There is no history of Esophageal cancer, Rectal cancer, Stomach cancer, or Thyroid disease.  ROS:   Please see the history of present illness.    All other systems reviewed and are negative.  EKGs/Labs/Other Studies Reviewed:    The following studies were reviewed today: Cardiac Studies & Procedures     STRESS TESTS  MYOCARDIAL PERFUSION IMAGING 12/17/2021  Narrative   The study is normal. Findings are consistent with no prior ischemia and no prior myocardial infarction. The study is low risk.   No ST deviation was noted.   LV perfusion is normal. There is no evidence of ischemia. There is no evidence of infarction.   Left ventricular function  is normal. Nuclear stress EF: 71 %. The left ventricular ejection fraction is hyperdynamic (>65%). End diastolic cavity size is normal. End systolic cavity size is normal.   Prior study available for comparison from 09/02/2017. No changes compared to prior study.   ECHOCARDIOGRAM  ECHOCARDIOGRAM COMPLETE 02/23/2023  Narrative ECHOCARDIOGRAM REPORT    Patient Name:   BRUNELLA WILEMAN Date of Exam: 02/23/2023 Medical Rec #:  [id number]     Height:       63.0 in Accession #:    [id number]    Weight:       123.0 lb Date of Birth:  Feb 22, 1941      BSA:          1.573 m Patient Age:    82 years      BP:           124/80 mmHg Patient Gender: F             HR:           73 bpm. Exam Location:  [address]  Procedure: 2D Echo, Cardiac Doppler and Color Doppler  Indications:    I35.0 Aortic Stenosis  History:        Patient has prior history of Echocardiogram examinations, most recent 07/23/2022. CAD, Prior CABG, Signs/Symptoms:Murmur; Risk Factors:Hypertension and Dyslipidemia. DVT. History of breast cancer. Bilateral breast implants.  Sonographer:    Sedonia Small Rodgers-Jones RDCS Referring Phys: [id number]  IMPRESSIONS   1. Left ventricular ejection fraction, by estimation, is 60 to 65%. Left ventricular ejection fraction by PLAX is 64 %. The left ventricle has normal function. The left ventricle has no regional wall motion abnormalities. There is mild left ventricular hypertrophy. Left ventricular diastolic parameters are consistent with Grade III diastolic dysfunction (restrictive). Elevated left ventricular end-diastolic pressure. 2. Right ventricular systolic function is low normal. The right ventricular size is normal. There is moderately elevated pulmonary artery systolic pressure. The  estimated right ventricular systolic pressure is 51.6 mmHg. 3. Left atrial size was moderately dilated. 4. The mitral valve is degenerative. Trivial mitral valve regurgitation. 5. The aortic valve  is calcified. There is severe calcifcation of the aortic valve. Aortic valve regurgitation is trivial. Moderate to severe aortic valve stenosis. Aortic regurgitation PHT measures 572 msec. Aortic valve area, by VTI measures 0.72 cm. Aortic valve mean gradient measures 25.0 mmHg. Aortic valve Vmax measures 3.32 m/s. Peak gradient 44 mmHg, DI 0.32. 6. The pulmonic valve was abnormal. 7. Aortic dilatation noted. There is borderline dilatation of the ascending aorta, measuring 39 mm. 8. The inferior vena cava is normal in size with <50% respiratory variability, suggesting right atrial pressure of 8 mmHg.  Comparison(s): Changes from prior study are noted. 07/23/2022: LVEF 60-65%, mild to moderate AS - mean gradient 19 mmHg.  FINDINGS Left Ventricle: Left ventricular ejection fraction, by estimation, is 60 to 65%. Left ventricular ejection fraction by PLAX is 64 %. The left ventricle has normal function. The left ventricle has no regional wall motion abnormalities. The left ventricular internal cavity size was normal in size. There is mild left ventricular hypertrophy. Left ventricular diastolic parameters are consistent with Grade III diastolic dysfunction (restrictive). Elevated left ventricular end-diastolic pressure.  Right Ventricle: The right ventricular size is normal. No increase in right ventricular wall thickness. Right ventricular systolic function is low normal. There is moderately elevated pulmonary artery systolic pressure. The tricuspid regurgitant velocity is 3.30 m/s, and with an assumed right atrial pressure of 8 mmHg, the estimated right ventricular systolic pressure is 51.6 mmHg.  Left Atrium: Left atrial size was moderately dilated.  Right Atrium: Right atrial size was normal in size.  Pericardium: There is no evidence of pericardial effusion.  Mitral Valve: The mitral valve is degenerative in appearance. There is moderate calcification of the posterior and anterior mitral valve  leaflet(s). Mild to moderate mitral annular calcification. Trivial mitral valve regurgitation.  Tricuspid Valve: The tricuspid valve is grossly normal. Tricuspid valve regurgitation is mild.  Aortic Valve: The aortic valve is calcified. There is severe calcifcation of the aortic valve. Aortic valve regurgitation is trivial. Aortic regurgitation PHT measures 572 msec. Moderate to severe aortic stenosis is present. Aortic valve mean gradient measures 25.0 mmHg. Aortic valve peak gradient measures 44.1 mmHg. Aortic valve area, by VTI measures 0.72 cm.  Pulmonic Valve: The pulmonic valve was abnormal. Pulmonic valve regurgitation is mild to moderate.  Aorta: Aortic dilatation noted. There is borderline dilatation of the ascending aorta, measuring 39 mm.  Venous: The inferior vena cava is normal in size with less than 50% respiratory variability, suggesting right atrial pressure of 8 mmHg.  IAS/Shunts: No atrial level shunt detected by color flow Doppler.   LEFT VENTRICLE PLAX 2D LV EF:         Left            Diastology ventricular     LV e' medial:    4.40 cm/s ejection        LV E/e' medial:  32.0 fraction by     LV e' lateral:   6.74 cm/s PLAX is 64      LV E/e' lateral: 20.9 %. LVIDd:         3.80 cm LVIDs:         2.50 cm LV PW:         1.00 cm LV IVS:        1.00 cm LVOT diam:  1.70 cm LV SV:         58 LV SV Index:   37 LVOT Area:     2.27 cm   RIGHT VENTRICLE             IVC RV Basal diam:  4.00 cm     IVC diam: 1.50 cm RV S prime:     11.00 cm/s TAPSE (M-mode): 2.2 cm  LEFT ATRIUM             Index        RIGHT ATRIUM           Index LA diam:        4.20 cm 2.67 cm/m   RA Area:     11.70 cm LA Vol (A2C):   48.7 ml 30.97 ml/m  RA Volume:   26.90 ml  17.10 ml/m LA Vol (A4C):   55.0 ml 34.97 ml/m LA Biplane Vol: 53.6 ml 34.08 ml/m AORTIC VALVE AV Area (Vmax):    0.63 cm AV Area (Vmean):   0.62 cm AV Area (VTI):     0.72 cm AV Vmax:           332.00  cm/s AV Vmean:          231.000 cm/s AV VTI:            0.810 m AV Peak Grad:      44.1 mmHg AV Mean Grad:      25.0 mmHg LVOT Vmax:         92.40 cm/s LVOT Vmean:        63.400 cm/s LVOT VTI:          0.256 m LVOT/AV VTI ratio: 0.32 AI PHT:            572 msec  AORTA Ao Root diam: 3.00 cm Ao Asc diam:  3.90 cm  MITRAL VALVE                TRICUSPID VALVE MV Area (PHT): 3.48 cm     TR Peak grad:   43.6 mmHg MV Decel Time: 218 msec     TR Vmax:        330.00 cm/s MV E velocity: 141.00 cm/s MV A velocity: 43.60 cm/s   SHUNTS MV E/A ratio:  3.23         Systemic VTI:  0.26 m Systemic Diam: 1.70 cm  Zoila Shutter MD Electronically signed by Zoila Shutter MD Signature Date/Time: 02/23/2023/4:12:14 PM    Final     CT SCANS  CT CORONARY MORPH W/CTA COR W/SCORE 03/08/2023  Addendum 03/08/2023  1:17 PM ADDENDUM REPORT: 03/08/2023 13:14  CLINICAL DATA:  Aortic stenosis  EXAM: Cardiac TAVR CT  TECHNIQUE: The patient was scanned on a Siemens Force 192 slice scanner. A 120 kV retrospective scan was triggered in the descending thoracic aorta at 111 HU's. Gantry rotation speed was 270 msecs and collimation was .9 mm. No beta blockade or nitro were given. The 3D data set was reconstructed in 5% intervals of the R-R cycle. Systolic and diastolic phases were analyzed on a dedicated work station using MPR, MIP and VRT modes. The patient received 80 cc of contrast.  FINDINGS: Aortic Valve: Functionally bicuspid with fused right and left cusp AV with calcium score 874  Aorta: No aneurysm normal arch vessels moderate calcific atherosclerosis  Sinotubular Junction: 26.4 mm  Ascending Thoracic Aorta: 34 mm  Aortic Arch: 25 mm  Descending Thoracic Aorta: 24 mm  Sinus  of Valsalva Measurements:  Non-coronary: 31.4 mm  Height 23.1 mm  Right - coronary: 28.6 mm  Height 16.2 mm  Left - coronary: 30.8 mm  Height 18.4 mm  Coronary Artery Height above Annulus:  Left Main:  13.9 mm above annulus  Right Coronary: 9.3 mm above annulus  Virtual Basal Annulus Measurements:  Maximum/Minimum Diameter: 23.3 mm x 18.6 mm Average diameter 21.2 mm  Perimeter: 67.7 mm  Area: 353 mm2  Coronary Arteries: Sufficient height above annulus for deployment and patient has by pass grafts  Optimum Fluoroscopic Angle for Delivery: LAO 7 Caudal 6 degrees  IMPRESSION: 1. Functionally bicuspid AV with fused right and left cusp score 874  2. Annular area of 353 mm2 suitable for a 23 mm Sapien 3 valve Alternatively a 26 Medtronic Evolut valve would fit  3.  Optimum angiographic angle for deployment LAO 7 Caudal 6 degrees  4.  Coronary arteries sufficient height above annulus for deployment  5.  There are 2 patent SVGls and a patent LIMA  6.  Membranous septal length 9.5 mm  Charlton Haws   Electronically Signed By: Charlton Haws M.D. On: 03/08/2023 13:14  Narrative EXAM: OVER-READ INTERPRETATION  CT CHEST  The following report is a limited chest CT over-read performed by radiologist Dr. Allegra Lai of [facility] on 03/08/2023. This over-read does not include interpretation of cardiac or coronary anatomy or pathology. The cardiac TAVR interpretation by the cardiologist is attached.  COMPARISON:  None Available.  FINDINGS: Extracardiac findings will be described separately under dictation for contemporaneously obtained CTA chest, abdomen and pelvis.  IMPRESSION: Please see separate dictation for contemporaneously obtained CTA chest, abdomen and pelvis dated 03/08/2023 for full description of relevant extracardiac findings.  Electronically Signed: By: Allegra Lai M.D. On: 03/08/2023 11:53          EKG Interpretation Date/Time:  Monday June 06 2023 15:40:30 EDT Ventricular Rate:  59 PR Interval:  190 QRS Duration:  72 QT Interval:  414 QTC Calculation: 409 R Axis:   13  Text Interpretation: Sinus bradycardia Cannot rule  out Anterior infarct , age undetermined When compared with ECG of 02-Jun-2023 09:31, PREVIOUS ECG IS PRESENT No significant change was found Confirmed by Tonny Bollman [phone]) on 06/06/2023 5:19:50 PM    Recent Labs: 08/04/2022: Pro B Natriuretic peptide (BNP) 232.0 06/02/2023: ALT 18; B Natriuretic Peptide 224.4; BUN 12; Creatinine, Ser 0.71; Hemoglobin 14.0; Magnesium 2.2; Platelets 241; Potassium 4.3; Sodium 133; TSH 2.471  Recent Lipid Panel    Component Value Date/Time   CHOL 132 02/02/2023 1523   CHOL 134 07/28/2018 1153   TRIG 77.0 02/02/2023 1523   HDL 55.40 02/02/2023 1523   HDL 52 07/28/2018 1153   CHOLHDL 2 02/02/2023 1523   VLDL 15.4 02/02/2023 1523   LDLCALC 61 02/02/2023 1523   LDLCALC 71 07/30/2020 1631     Risk Assessment/Calculations:                Physical Exam:    VS:  BP 120/80   Pulse (!) 59   Ht 5\' 2"  (1.575 m)   Wt 124 lb 12.8 oz (56.6 kg)   SpO2 96%   BMI 22.83 kg/m     Wt Readings from Last 3 Encounters:  06/06/23 124 lb 12.8 oz (56.6 kg)  06/02/23 125 lb 10.6 oz (57 kg)  02/28/23 126 lb 3.2 oz (57.2 kg)     GEN:  Well nourished, well developed in no acute distress HEENT: Normal  NECK: No JVD; No carotid bruits LYMPHATICS: No lymphadenopathy CARDIAC: RRR, 3/6 harsh late peaking systolic murmur at the RUSB RESPIRATORY:  Clear to auscultation without rales, wheezing or rhonchi  ABDOMEN: Soft, non-tender, non-distended MUSCULOSKELETAL:  No edema; No deformity  SKIN: Warm and dry NEUROLOGIC:  Alert and oriented x 3 PSYCHIATRIC:  Normal affect   ASSESSMENT:    1. Atherosclerosis of native coronary artery of native heart with angina pectoris (HCC)   2. Nonrheumatic aortic valve stenosis   3. Essential hypertension   4. Mixed hyperlipidemia    PLAN:    In order of problems listed above:  The patient will undergo right and left heart catheterization (see below for further discussion).  Continue aspirin and atorvastatin. The patient  has developed progressive aortic stenosis, likely with severe, stage D3 disease (paradoxical low-flow low gradient).  Her LVEF is preserved at 65%, mean transaortic gradient is 25 mmHg, calculated aortic valve area 0.7 cm, stroke-volume index is 37, and dimensionless index is 0.32.  Her exam is suggestive of severe aortic stenosis.  She clearly has progressive symptoms at this point with NYHA functional class III limitation.  She has undergone CTA studies with her cardiac scan showing a functionally bicuspid valve with fusion of the right and left cusps, aortic valve calcium score of 874, aortic annulus area of 353 mm, fairly large sinuses ranging from 28 to 31 mm.  CTA of the chest, abdomen, and pelvis, demonstrates suitable vascular access for transfemoral TAVR. I have reviewed the natural history of aortic stenosis with the patient and her daughter today. We have discussed the limitations of medical therapy and the poor prognosis associated with symptomatic aortic stenosis. We have reviewed potential treatment options, including palliative medical therapy, conventional surgical aortic valve replacement, and transcatheter aortic valve replacement. We discussed treatment options in the context of the patient's specific comorbid medical conditions.  TAVR would be the preferred treatment in the context of her age of 62 and prior CABG. She will need cardiac catheterization prior to cardiac surgical evaluation. I have reviewed the risks, indications, and alternatives to cardiac catheterization, possible angioplasty, and stenting with the patient. Risks include but are not limited to bleeding, infection, vascular injury, stroke, myocardial infection, arrhythmia, kidney injury, radiation-related injury in the case of prolonged fluoroscopy use, emergency cardiac surgery, and death. The patient understands the risks of serious complication is 1-2 in 1000 with diagnostic cardiac cath and 1-2% or less with  angioplasty/stenting.  Once her heart catheterization is completed, she will be referred for formal cardiac surgical consultation as part of a multidisciplinary approach to her care.   Patient's last heart catheterization performed in 2011 was dione via femoral access.       Informed Consent   Shared Decision Making/Informed Consent The risks [stroke (1 in 1000), death (1 in 1000), kidney failure [usually temporary] (1 in 500), bleeding (1 in 200), allergic reaction [possibly serious] (1 in 200)], benefits (diagnostic support and management of coronary artery disease) and alternatives of a cardiac catheterization were discussed in detail with Ms. Frenkel and she is willing to proceed.       Medication Adjustments/Labs and Tests Ordered: Current medicines are reviewed at length with the patient today.  Concerns regarding medicines are outlined above.  Orders Placed This Encounter  Procedures   EKG 12-Lead   No orders of the defined types were placed in this encounter.   Patient Instructions  Medication Instructions:  Your physician recommends that you continue on your current  medications as directed. Please refer to the Current Medication list given to you today.  *If you need a refill on your cardiac medications before your next appointment, please call your pharmacy*  Lab Work: NONE If you have labs (blood work) drawn today and your tests are completely normal, you will receive your results only by: MyChart Message (if you have MyChart) OR A paper copy in the mail If you have any lab test that is abnormal or we need to change your treatment, we will call you to review the results.  Testing/Procedures: R & L heart Catheterization Your physician has requested that you have a cardiac catheterization. Cardiac catheterization is used to diagnose and/or treat various heart conditions. Doctors may recommend this procedure for a number of different reasons. The most common reason is to  evaluate chest pain. Chest pain can be a symptom of coronary artery disease (CAD), and cardiac catheterization can show whether plaque is narrowing or blocking your heart's arteries. This procedure is also used to evaluate the valves, as well as measure the blood flow and oxygen levels in different parts of your heart. For further information please visit [email or web address] Please follow instruction sheet, as given.  Follow-Up: At [facility], you and your health needs are our priority.  As part of our continuing mission to provide you with exceptional heart care, we have created designated Provider Care Teams.  These Care Teams include your primary Cardiologist (physician) and Advanced Practice Providers (APPs -  Physician Assistants and Nurse Practitioners) who all work together to provide you with the care you need, when you need it.  Your next appointment:   Structural Team will follow-up  Provider:   Tonny Bollman, MD      Other Instructions       Cardiac/Peripheral Catheterization   You are scheduled for a Cardiac Catheterization on Thursday, July 25 with Dr. Verne Carrow.  1. Please arrive at the [facility] (Main Entrance A) at [facility]: [address] [place], [place] [address] at 10:00 AM (This time is two hour(s) before your procedure to ensure your preparation). Free valet parking service is available. You will check in at ADMITTING. The support person will be asked to wait in the waiting room.  It is OK to have someone drop you off and come back when you are ready to be discharged.        Special note: Every effort is made to have your procedure done on time. Please understand that emergencies sometimes delay scheduled procedures.  2. Diet: Do not eat solid foods after midnight.  You may have clear liquids until 5 AM the day of the procedure.  3. Labs: NONE  4. Medication instructions in preparation for your procedure:   Contrast Allergy:  No  DO NOT TAKE Furosemide the morning of procedure  On the morning of your procedure, take Aspirin 81 mg and any morning medicines NOT listed above.  You may use sips of water.  5. Plan to go home the same day, you will only stay overnight if medically necessary. 6. You MUST have a responsible adult to drive you home. 7. An adult MUST be with you the first 24 hours after you arrive home. 8. Bring a current list of your medications, and the last time and date medication taken. 9. Bring ID and current insurance cards. 10.Please wear clothes that are easy to get on and off and wear slip-on shoes.  Thank you for allowing [place] to  care for you!   -- [facility]    Signed, Tonny Bollman, MD  06/06/2023 5:20 PM    [facility]

## 2023-06-06 NOTE — Progress Notes (Addendum)
Pre Surgical Assessment: 5 M Walk Test  72M=16.54ft  5 Meter Walk Test- trial 1: 14.14 seconds 5 Meter Walk Test- trial 2: 25.48 seconds 5 Meter Walk Test- trial 3: 23.27 seconds 5 Meter Walk Test Average: 20.96 seconds  __________________________   STS: ISOLATED AVR: 4.9%,

## 2023-06-06 NOTE — Progress Notes (Signed)
Cardiology Office Note:    Date:  06/06/2023   ID:  Angelica, Mullen [birth year], MRN [id number]  PCP:  Pincus Sanes, MD   [facility] Providers Cardiologist:  Tonny Bollman, MD     Referring MD: Pincus Sanes, MD   Chief Complaint  Patient presents with   Aortic Stenosis    History of Present Illness:    [name] is a 82 y.o. female presents for follow-up of coronary artery disease and aortic stenosis.  She underwent multivessel CABG in 2007.  Cardiac catheterization in 2011 demonstrated patency of all of her bypass grafts.  She has developed progressive aortic stenosis over the past few years and presents today for follow-up evaluation.  She is here with her daughter.  When I saw her in April, she complained of exertional dyspnea and fatigue.  Her most recent echo demonstrated some progression of aortic stenosis in the moderate to severe range and we had a lengthy discussion regarding treatment options.  She elected to return today for follow-up to discuss further evaluation in the presence of her daughter.  The patient was just seen in the emergency room few days ago with symptoms of progressive fatigue and weakness, headache, and was concerned about stroke symptoms but her evaluation demonstrated no localizing neurologic deficits.  She was seen by cardiology who recommended to keep her close outpatient follow-up that was already scheduled for today.  The patient reports progressive exertional dyspnea now with low-level physical activity.  She has worsening fatigue and dizziness as well.  She occasionally has chest discomfort but this has not been related to physical exertion.  She has had a localized area over the left chest where she feels a pressure-like sensation.  She denies orthopnea or PND.  She has no leg edema.  Past Medical History:  Diagnosis Date   ALLERGIC RHINITIS    Allergy    SEASONAL   Anxiety    Aortic stenosis, moderate    Blood transfusion  without reported diagnosis    BREAST CANCER 1991   left, 1991; B mastectomy   Cataract    BILATERAL-REMOVED   Coronary artery disease    s/p CABGx5 2007 //Nuclear stress test 10/18: EF 76, no ischemia or infarction, low risk   Depression    DIVERTICULOSIS    DVT    EROSIVE GASTRITIS    GERD    Heart murmur    History of DVT (deep vein thrombosis)    HYPERLIPIDEMIA    Hypertension    HYPOTHYROIDISM    Occlusion and stenosis of carotid artery without mention of cerebral infarction    Osteopenia    OSTEOPOROSIS    RHINOSINUSITIS, RECURRENT     Past Surgical History:  Procedure Laterality Date   APPENDECTOMY     COLONOSCOPY     CORONARY ARTERY BYPASS GRAFT     5        2007   MASTECTOMY     left with reconstruction and lymph node excision  1992   MASTECTOMY     right with reconstruction   REVISION RECONSTRUCTED BREAST Left 02/2014   [facility]ard (plastics in [place])   right ankle arthroscopy     TEAR DUCT CANAL     TONSILLECTOMY AND ADENOIDECTOMY     UPPER GASTROINTESTINAL ENDOSCOPY     VAGINAL HYSTERECTOMY     ovaries not excised    Current Medications: Current Meds  Medication Sig   aspirin 81 MG tablet Take 81 mg  by mouth daily.     atorvastatin (LIPITOR) 20 MG tablet TAKE 1 TABLET BY MOUTH EVERY DAY (Patient taking differently: Take 20 mg by mouth See admin instructions. TAKE 1 TABLET BY MOUTH EVERY DAY)   Calcium Carbonate (CALTRATE 600 PO) Take 1 tablet by mouth 2 (two) times daily.     citalopram (CELEXA) 20 MG tablet TAKE 1 TABLET BY MOUTH EVERY DAY   co-enzyme Q-10 30 MG capsule Take 30 mg by mouth daily.   empagliflozin (JARDIANCE) 10 MG TABS tablet Take 1 tablet (10 mg total) by mouth daily before breakfast.   famotidine (PEPCID) 40 MG tablet Take 1 tablet (40 mg total) by mouth daily.   fluticasone (FLONASE) 50 MCG/ACT nasal spray Place 1 spray into both nostrils daily as needed for allergies or rhinitis.   furosemide (LASIX) 20 MG tablet TAKE 1 TABLET BY MOUTH  EVERY DAY   levothyroxine (SYNTHROID) 75 MCG tablet TAKE 1 TABLET BY MOUTH EVERY MORNING BEFORE BREAKFAST   losartan (COZAAR) 50 MG tablet TAKE 1 TABLET BY MOUTH EVERY DAY   meloxicam (MOBIC) 7.5 MG tablet TAKE 1/2 TO 1 TABLET EVERY DAY   metoprolol tartrate (LOPRESSOR) 25 MG tablet Take 1 tablet (25 mg total) by mouth 2 (two) times daily.   Multiple Vitamins-Minerals (MULTIVITAMIN) LIQD Take 1 tablet by mouth daily.   Multiple Vitamins-Minerals (PRESERVISION AREDS 2) CAPS Take 1 capsule by mouth 2 (two) times daily.   senna-docusate (SENOKOT-S) 8.6-50 MG per tablet Take 1 tablet by mouth daily.     VITAMIN D, CHOLECALCIFEROL, PO Take 1 tablet by mouth daily.   zolpidem (AMBIEN) 5 MG tablet Take 1 tablet (5 mg total) by mouth at bedtime as needed. for sleep (Patient taking differently: Take 5 mg by mouth at bedtime as needed for sleep.)     Allergies:   Clarithromycin, Clindamycin, Codeine, Levofloxacin, Morphine, Naproxen, Omeprazole, Penicillins, and Pneumococcal vaccines   Social History   Socioeconomic History   Marital status: Divorced    Spouse name: Not on file   Number of children: 2   Years of education: Not on file   Highest education level: Not on file  Occupational History   Occupation: retired  Tobacco Use   Smoking status: Never   Smokeless tobacco: Never  Vaping Use   Vaping status: Never Used  Substance and Sexual Activity   Alcohol use: No    Alcohol/week: 0.0 standard drinks of alcohol   Drug use: No   Sexual activity: Not Currently  Other Topics Concern   Not on file  Social History Narrative   Divorced x 3, lives alone   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, school psychologist   Right handed    Social Determinants of Health   Financial Resource Strain: Low Risk  (09/02/2022)   Overall Financial Resource Strain (CARDIA)    Difficulty of Paying Living Expenses: Not hard at all  Food Insecurity: No Food Insecurity (09/02/2022)   Hunger Vital  Sign    Worried About Running Out of Food in the Last Year: Never true    Ran Out of Food in the Last Year: Never true  Transportation Needs: No Transportation Needs (09/02/2022)   PRAPARE - Administrator, Civil Service (Medical): No    Lack of Transportation (Non-Medical): No  Physical Activity: Inactive (09/02/2022)   Exercise Vital Sign    Days of Exercise per Week: 0 days    Minutes of Exercise per Session: 0 min  Stress: No Stress Concern Present (09/02/2022)   [employer] of Occupational Health - Occupational Stress Questionnaire    Feeling of Stress : Not at all  Social Connections: Moderately Integrated (09/02/2022)   Social Connection and Isolation Panel [NHANES]    Frequency of Communication with Friends and Family: More than three times a week    Frequency of Social Gatherings with Friends and Family: More than three times a week    Attends Religious Services: More than 4 times per year    Active Member of [employer] or Organizations: Yes    Attends Engineer, structural: More than 4 times per year    Marital Status: Divorced     Family History: The patient's family history includes Breast cancer in her mother; Colon cancer in her sister and sister; Emphysema in her father; Lung disease in her mother. There is no history of Esophageal cancer, Rectal cancer, Stomach cancer, or Thyroid disease.  ROS:   Please see the history of present illness.    All other systems reviewed and are negative.  EKGs/Labs/Other Studies Reviewed:    The following studies were reviewed today: Cardiac Studies & Procedures     STRESS TESTS  MYOCARDIAL PERFUSION IMAGING 12/17/2021  Narrative   The study is normal. Findings are consistent with no prior ischemia and no prior myocardial infarction. The study is low risk.   No ST deviation was noted.   LV perfusion is normal. There is no evidence of ischemia. There is no evidence of infarction.   Left ventricular function  is normal. Nuclear stress EF: 71 %. The left ventricular ejection fraction is hyperdynamic (>65%). End diastolic cavity size is normal. End systolic cavity size is normal.   Prior study available for comparison from 09/02/2017. No changes compared to prior study.   ECHOCARDIOGRAM  ECHOCARDIOGRAM COMPLETE 02/23/2023  Narrative ECHOCARDIOGRAM REPORT    Patient Name:   BRUNELLA WILEMAN Date of Exam: 02/23/2023 Medical Rec #:  [id number]     Height:       63.0 in Accession #:    [id number]    Weight:       123.0 lb Date of Birth:  Feb 22, 1941      BSA:          1.573 m Patient Age:    82 years      BP:           124/80 mmHg Patient Gender: F             HR:           73 bpm. Exam Location:  [address]  Procedure: 2D Echo, Cardiac Doppler and Color Doppler  Indications:    I35.0 Aortic Stenosis  History:        Patient has prior history of Echocardiogram examinations, most recent 07/23/2022. CAD, Prior CABG, Signs/Symptoms:Murmur; Risk Factors:Hypertension and Dyslipidemia. DVT. History of breast cancer. Bilateral breast implants.  Sonographer:    Sedonia Small Rodgers-Jones RDCS Referring Phys: [id number]    IMPRESSIONS   1. Left ventricular ejection fraction, by estimation, is 60 to 65%. Left ventricular ejection fraction by PLAX is 64 %. The left ventricle has normal function. The left ventricle has no regional wall motion abnormalities. There is mild left ventricular hypertrophy. Left ventricular diastolic parameters are consistent with Grade III diastolic dysfunction (restrictive). Elevated left ventricular end-diastolic pressure. 2. Right ventricular systolic function is low normal. The right ventricular size is normal. There is moderately elevated pulmonary artery systolic pressure. The  estimated right ventricular systolic pressure is 51.6 mmHg. 3. Left atrial size was moderately dilated. 4. The mitral valve is degenerative. Trivial mitral valve regurgitation. 5. The aortic valve  is calcified. There is severe calcifcation of the aortic valve. Aortic valve regurgitation is trivial. Moderate to severe aortic valve stenosis. Aortic regurgitation PHT measures 572 msec. Aortic valve area, by VTI measures 0.72 cm. Aortic valve mean gradient measures 25.0 mmHg. Aortic valve Vmax measures 3.32 m/s. Peak gradient 44 mmHg, DI 0.32. 6. The pulmonic valve was abnormal. 7. Aortic dilatation noted. There is borderline dilatation of the ascending aorta, measuring 39 mm. 8. The inferior vena cava is normal in size with <50% respiratory variability, suggesting right atrial pressure of 8 mmHg.  Comparison(s): Changes from prior study are noted. 07/23/2022: LVEF 60-65%, mild to moderate AS - mean gradient 19 mmHg.  FINDINGS Left Ventricle: Left ventricular ejection fraction, by estimation, is 60 to 65%. Left ventricular ejection fraction by PLAX is 64 %. The left ventricle has normal function. The left ventricle has no regional wall motion abnormalities. The left ventricular internal cavity size was normal in size. There is mild left ventricular hypertrophy. Left ventricular diastolic parameters are consistent with Grade III diastolic dysfunction (restrictive). Elevated left ventricular end-diastolic pressure.  Right Ventricle: The right ventricular size is normal. No increase in right ventricular wall thickness. Right ventricular systolic function is low normal. There is moderately elevated pulmonary artery systolic pressure. The tricuspid regurgitant velocity is 3.30 m/s, and with an assumed right atrial pressure of 8 mmHg, the estimated right ventricular systolic pressure is 51.6 mmHg.  Left Atrium: Left atrial size was moderately dilated.  Right Atrium: Right atrial size was normal in size.  Pericardium: There is no evidence of pericardial effusion.  Mitral Valve: The mitral valve is degenerative in appearance. There is moderate calcification of the posterior and anterior mitral valve  leaflet(s). Mild to moderate mitral annular calcification. Trivial mitral valve regurgitation.  Tricuspid Valve: The tricuspid valve is grossly normal. Tricuspid valve regurgitation is mild.  Aortic Valve: The aortic valve is calcified. There is severe calcifcation of the aortic valve. Aortic valve regurgitation is trivial. Aortic regurgitation PHT measures 572 msec. Moderate to severe aortic stenosis is present. Aortic valve mean gradient measures 25.0 mmHg. Aortic valve peak gradient measures 44.1 mmHg. Aortic valve area, by VTI measures 0.72 cm.  Pulmonic Valve: The pulmonic valve was abnormal. Pulmonic valve regurgitation is mild to moderate.  Aorta: Aortic dilatation noted. There is borderline dilatation of the ascending aorta, measuring 39 mm.  Venous: The inferior vena cava is normal in size with less than 50% respiratory variability, suggesting right atrial pressure of 8 mmHg.  IAS/Shunts: No atrial level shunt detected by color flow Doppler.   LEFT VENTRICLE PLAX 2D LV EF:         Left            Diastology ventricular     LV e' medial:    4.40 cm/s ejection        LV E/e' medial:  32.0 fraction by     LV e' lateral:   6.74 cm/s PLAX is 64      LV E/e' lateral: 20.9 %. LVIDd:         3.80 cm LVIDs:         2.50 cm LV PW:         1.00 cm LV IVS:        1.00 cm LVOT diam:  1.70 cm LV SV:         58 LV SV Index:   37 LVOT Area:     2.27 cm   RIGHT VENTRICLE             IVC RV Basal diam:  4.00 cm     IVC diam: 1.50 cm RV S prime:     11.00 cm/s TAPSE (M-mode): 2.2 cm  LEFT ATRIUM             Index        RIGHT ATRIUM           Index LA diam:        4.20 cm 2.67 cm/m   RA Area:     11.70 cm LA Vol (A2C):   48.7 ml 30.97 ml/m  RA Volume:   26.90 ml  17.10 ml/m LA Vol (A4C):   55.0 ml 34.97 ml/m LA Biplane Vol: 53.6 ml 34.08 ml/m AORTIC VALVE AV Area (Vmax):    0.63 cm AV Area (Vmean):   0.62 cm AV Area (VTI):     0.72 cm AV Vmax:           332.00  cm/s AV Vmean:          231.000 cm/s AV VTI:            0.810 m AV Peak Grad:      44.1 mmHg AV Mean Grad:      25.0 mmHg LVOT Vmax:         92.40 cm/s LVOT Vmean:        63.400 cm/s LVOT VTI:          0.256 m LVOT/AV VTI ratio: 0.32 AI PHT:            572 msec  AORTA Ao Root diam: 3.00 cm Ao Asc diam:  3.90 cm  MITRAL VALVE                TRICUSPID VALVE MV Area (PHT): 3.48 cm     TR Peak grad:   43.6 mmHg MV Decel Time: 218 msec     TR Vmax:        330.00 cm/s MV E velocity: 141.00 cm/s MV A velocity: 43.60 cm/s   SHUNTS MV E/A ratio:  3.23         Systemic VTI:  0.26 m Systemic Diam: 1.70 cm  Zoila Shutter MD Electronically signed by Zoila Shutter MD Signature Date/Time: 02/23/2023/4:12:14 PM    Final     CT SCANS  CT CORONARY MORPH W/CTA COR W/SCORE 03/08/2023  Addendum 03/08/2023  1:17 PM ADDENDUM REPORT: 03/08/2023 13:14  CLINICAL DATA:  Aortic stenosis  EXAM: Cardiac TAVR CT  TECHNIQUE: The patient was scanned on a Siemens Force 192 slice scanner. A 120 kV retrospective scan was triggered in the descending thoracic aorta at 111 HU's. Gantry rotation speed was 270 msecs and collimation was .9 mm. No beta blockade or nitro were given. The 3D data set was reconstructed in 5% intervals of the R-R cycle. Systolic and diastolic phases were analyzed on a dedicated work station using MPR, MIP and VRT modes. The patient received 80 cc of contrast.  FINDINGS: Aortic Valve: Functionally bicuspid with fused right and left cusp AV with calcium score 874  Aorta: No aneurysm normal arch vessels moderate calcific atherosclerosis  Sinotubular Junction: 26.4 mm  Ascending Thoracic Aorta: 34 mm  Aortic Arch: 25 mm  Descending Thoracic Aorta: 24 mm  Sinus  of Valsalva Measurements:  Non-coronary: 31.4 mm  Height 23.1 mm  Right - coronary: 28.6 mm  Height 16.2 mm  Left - coronary: 30.8 mm  Height 18.4 mm  Coronary Artery Height above Annulus:  Left Main:  13.9 mm above annulus  Right Coronary: 9.3 mm above annulus  Virtual Basal Annulus Measurements:  Maximum/Minimum Diameter: 23.3 mm x 18.6 mm Average diameter 21.2 mm  Perimeter: 67.7 mm  Area: 353 mm2  Coronary Arteries: Sufficient height above annulus for deployment and patient has by pass grafts  Optimum Fluoroscopic Angle for Delivery: LAO 7 Caudal 6 degrees  IMPRESSION: 1. Functionally bicuspid AV with fused right and left cusp score 874  2. Annular area of 353 mm2 suitable for a 23 mm Sapien 3 valve Alternatively a 26 Medtronic Evolut valve would fit  3.  Optimum angiographic angle for deployment LAO 7 Caudal 6 degrees  4.  Coronary arteries sufficient height above annulus for deployment  5.  There are 2 patent SVGls and a patent LIMA  6.  Membranous septal length 9.5 mm  Charlton Haws   Electronically Signed By: Charlton Haws M.D. On: 03/08/2023 13:14  Narrative EXAM: OVER-READ INTERPRETATION  CT CHEST  The following report is a limited chest CT over-read performed by radiologist Dr. Allegra Lai of [facility] on 03/08/2023. This over-read does not include interpretation of cardiac or coronary anatomy or pathology. The cardiac TAVR interpretation by the cardiologist is attached.  COMPARISON:  None Available.  FINDINGS: Extracardiac findings will be described separately under dictation for contemporaneously obtained CTA chest, abdomen and pelvis.  IMPRESSION: Please see separate dictation for contemporaneously obtained CTA chest, abdomen and pelvis dated 03/08/2023 for full description of relevant extracardiac findings.  Electronically Signed: By: Allegra Lai M.D. On: 03/08/2023 11:53          EKG Interpretation Date/Time:  Monday June 06 2023 15:40:30 EDT Ventricular Rate:  59 PR Interval:  190 QRS Duration:  72 QT Interval:  414 QTC Calculation: 409 R Axis:   13  Text Interpretation: Sinus bradycardia Cannot rule  out Anterior infarct , age undetermined When compared with ECG of 02-Jun-2023 09:31, PREVIOUS ECG IS PRESENT No significant change was found Confirmed by Tonny Bollman [phone]) on 06/06/2023 5:19:50 PM    Recent Labs: 08/04/2022: Pro B Natriuretic peptide (BNP) 232.0 06/02/2023: ALT 18; B Natriuretic Peptide 224.4; BUN 12; Creatinine, Ser 0.71; Hemoglobin 14.0; Magnesium 2.2; Platelets 241; Potassium 4.3; Sodium 133; TSH 2.471  Recent Lipid Panel    Component Value Date/Time   CHOL 132 02/02/2023 1523   CHOL 134 07/28/2018 1153   TRIG 77.0 02/02/2023 1523   HDL 55.40 02/02/2023 1523   HDL 52 07/28/2018 1153   CHOLHDL 2 02/02/2023 1523   VLDL 15.4 02/02/2023 1523   LDLCALC 61 02/02/2023 1523   LDLCALC 71 07/30/2020 1631     Risk Assessment/Calculations:                Physical Exam:    VS:  BP 120/80   Pulse (!) 59   Ht 5\' 2"  (1.575 m)   Wt 124 lb 12.8 oz (56.6 kg)   SpO2 96%   BMI 22.83 kg/m     Wt Readings from Last 3 Encounters:  06/06/23 124 lb 12.8 oz (56.6 kg)  06/02/23 125 lb 10.6 oz (57 kg)  02/28/23 126 lb 3.2 oz (57.2 kg)     GEN:  Well nourished, well developed in no acute distress HEENT: Normal  NECK: No JVD; No carotid bruits LYMPHATICS: No lymphadenopathy CARDIAC: RRR, 3/6 harsh late peaking systolic murmur at the RUSB RESPIRATORY:  Clear to auscultation without rales, wheezing or rhonchi  ABDOMEN: Soft, non-tender, non-distended MUSCULOSKELETAL:  No edema; No deformity  SKIN: Warm and dry NEUROLOGIC:  Alert and oriented x 3 PSYCHIATRIC:  Normal affect   ASSESSMENT:    1. Atherosclerosis of native coronary artery of native heart with angina pectoris (HCC)   2. Nonrheumatic aortic valve stenosis   3. Essential hypertension   4. Mixed hyperlipidemia    PLAN:    In order of problems listed above:  The patient will undergo right and left heart catheterization (see below for further discussion).  Continue aspirin and atorvastatin. The patient  has developed progressive aortic stenosis, likely with severe, stage D3 disease (paradoxical low-flow low gradient).  Her LVEF is preserved at 65%, mean transaortic gradient is 25 mmHg, calculated aortic valve area 0.7 cm, stroke-volume index is 37, and dimensionless index is 0.32.  Her exam is suggestive of severe aortic stenosis.  She clearly has progressive symptoms at this point with NYHA functional class III limitation.  She has undergone CTA studies with her cardiac scan showing a functionally bicuspid valve with fusion of the right and left cusps, aortic valve calcium score of 874, aortic annulus area of 353 mm, fairly large sinuses ranging from 28 to 31 mm.  CTA of the chest, abdomen, and pelvis, demonstrates suitable vascular access for transfemoral TAVR. I have reviewed the natural history of aortic stenosis with the patient and her daughter today. We have discussed the limitations of medical therapy and the poor prognosis associated with symptomatic aortic stenosis. We have reviewed potential treatment options, including palliative medical therapy, conventional surgical aortic valve replacement, and transcatheter aortic valve replacement. We discussed treatment options in the context of the patient's specific comorbid medical conditions.  TAVR would be the preferred treatment in the context of her age of 62 and prior CABG. She will need cardiac catheterization prior to cardiac surgical evaluation. I have reviewed the risks, indications, and alternatives to cardiac catheterization, possible angioplasty, and stenting with the patient. Risks include but are not limited to bleeding, infection, vascular injury, stroke, myocardial infection, arrhythmia, kidney injury, radiation-related injury in the case of prolonged fluoroscopy use, emergency cardiac surgery, and death. The patient understands the risks of serious complication is 1-2 in 1000 with diagnostic cardiac cath and 1-2% or less with  angioplasty/stenting.  Once her heart catheterization is completed, she will be referred for formal cardiac surgical consultation as part of a multidisciplinary approach to her care.   Patient's last heart catheterization performed in 2011 was dione via femoral access.       Informed Consent   Shared Decision Making/Informed Consent The risks [stroke (1 in 1000), death (1 in 1000), kidney failure [usually temporary] (1 in 500), bleeding (1 in 200), allergic reaction [possibly serious] (1 in 200)], benefits (diagnostic support and management of coronary artery disease) and alternatives of a cardiac catheterization were discussed in detail with Ms. Frenkel and she is willing to proceed.       Medication Adjustments/Labs and Tests Ordered: Current medicines are reviewed at length with the patient today.  Concerns regarding medicines are outlined above.  Orders Placed This Encounter  Procedures   EKG 12-Lead   No orders of the defined types were placed in this encounter.   Patient Instructions  Medication Instructions:  Your physician recommends that you continue on your current  medications as directed. Please refer to the Current Medication list given to you today.  *If you need a refill on your cardiac medications before your next appointment, please call your pharmacy*  Lab Work: NONE If you have labs (blood work) drawn today and your tests are completely normal, you will receive your results only by: MyChart Message (if you have MyChart) OR A paper copy in the mail If you have any lab test that is abnormal or we need to change your treatment, we will call you to review the results.  Testing/Procedures: R & L heart Catheterization Your physician has requested that you have a cardiac catheterization. Cardiac catheterization is used to diagnose and/or treat various heart conditions. Doctors may recommend this procedure for a number of different reasons. The most common reason is to  evaluate chest pain. Chest pain can be a symptom of coronary artery disease (CAD), and cardiac catheterization can show whether plaque is narrowing or blocking your heart's arteries. This procedure is also used to evaluate the valves, as well as measure the blood flow and oxygen levels in different parts of your heart. For further information please visit [email or web address] Please follow instruction sheet, as given.  Follow-Up: At [facility], you and your health needs are our priority.  As part of our continuing mission to provide you with exceptional heart care, we have created designated Provider Care Teams.  These Care Teams include your primary Cardiologist (physician) and Advanced Practice Providers (APPs -  Physician Assistants and Nurse Practitioners) who all work together to provide you with the care you need, when you need it.  Your next appointment:   Structural Team will follow-up  Provider:   Tonny Bollman, MD      Other Instructions       Cardiac/Peripheral Catheterization   You are scheduled for a Cardiac Catheterization on Thursday, July 25 with Dr. Verne Carrow.  1. Please arrive at the [facility] (Main Entrance A) at [facility]: [address] [place], [place] [address] at 10:00 AM (This time is two hour(s) before your procedure to ensure your preparation). Free valet parking service is available. You will check in at ADMITTING. The support person will be asked to wait in the waiting room.  It is OK to have someone drop you off and come back when you are ready to be discharged.        Special note: Every effort is made to have your procedure done on time. Please understand that emergencies sometimes delay scheduled procedures.  2. Diet: Do not eat solid foods after midnight.  You may have clear liquids until 5 AM the day of the procedure.  3. Labs: NONE  4. Medication instructions in preparation for your procedure:   Contrast Allergy:  No  DO NOT TAKE Furosemide the morning of procedure  On the morning of your procedure, take Aspirin 81 mg and any morning medicines NOT listed above.  You may use sips of water.  5. Plan to go home the same day, you will only stay overnight if medically necessary. 6. You MUST have a responsible adult to drive you home. 7. An adult MUST be with you the first 24 hours after you arrive home. 8. Bring a current list of your medications, and the last time and date medication taken. 9. Bring ID and current insurance cards. 10.Please wear clothes that are easy to get on and off and wear slip-on shoes.  Thank you for allowing [place] to  care for you!   -- [facility]    Signed, Tonny Bollman, MD  06/06/2023 5:20 PM    [facility]

## 2023-06-08 ENCOUNTER — Telehealth: Payer: Self-pay

## 2023-06-08 ENCOUNTER — Telehealth: Payer: Self-pay | Admitting: *Deleted

## 2023-06-08 NOTE — Telephone Encounter (Addendum)
Cardiac Catheterization scheduled at [facility] for: Thursday June 09, 2023 12 Noon Arrival time [facility] Main Entrance A at: 10 AM   Nothing to eat after midnight prior to procedure, clear liquids until 5 AM day of procedure.  Medication instructions: -Hold:  Jardiance-AM of procedure  Lasix-AM of procedure -Other usual  morning medications can be taken with sips of water including aspirin 81 mg.  Confirmed patient has responsible adult to drive home post procedure and be with patient first 24 hours after arriving home.  Plan to go home the same day, you will only stay overnight if medically necessary.  Reviewed procedure instructions with patient.

## 2023-06-08 NOTE — Telephone Encounter (Signed)
Transition Care Management Unsuccessful Follow-up Telephone Call  Date of discharge and from where:  06/02/2023 [facility]  Attempts:  1st Attempt  Reason for unsuccessful TCM follow-up call:  No answer/busy   [facility]  [facility] Population Health Community Resource Care Guide   ??[email or web address]  ?? [phone]   Website: [email or web address]  [email or web address]

## 2023-06-09 ENCOUNTER — Encounter (HOSPITAL_COMMUNITY)
Admission: RE | Disposition: A | Discharge status: Home / Self Care | Payer: Self-pay | Source: Home / Self Care | Attending: Cardiovascular Disease

## 2023-06-09 ENCOUNTER — Other Ambulatory Visit: Payer: Self-pay

## 2023-06-09 ENCOUNTER — Ambulatory Visit (HOSPITAL_COMMUNITY)
Admission: RE | Admit: 2023-06-09 | Discharge: 2023-06-09 | Disposition: A | Discharge status: Home / Self Care | Payer: Medicare Other | Attending: Cardiovascular Disease | Admitting: Cardiovascular Disease

## 2023-06-09 DIAGNOSIS — I350 Nonrheumatic aortic (valve) stenosis: Secondary | ICD-10-CM | POA: Insufficient documentation

## 2023-06-09 DIAGNOSIS — E782 Mixed hyperlipidemia: Secondary | ICD-10-CM | POA: Diagnosis not present

## 2023-06-09 DIAGNOSIS — I10 Essential (primary) hypertension: Secondary | ICD-10-CM | POA: Insufficient documentation

## 2023-06-09 DIAGNOSIS — Z7982 Long term (current) use of aspirin: Secondary | ICD-10-CM | POA: Insufficient documentation

## 2023-06-09 DIAGNOSIS — I25119 Atherosclerotic heart disease of native coronary artery with unspecified angina pectoris: Secondary | ICD-10-CM | POA: Diagnosis not present

## 2023-06-09 DIAGNOSIS — F0390 Unspecified dementia without behavioral disturbance: Secondary | ICD-10-CM | POA: Insufficient documentation

## 2023-06-09 DIAGNOSIS — I2582 Chronic total occlusion of coronary artery: Secondary | ICD-10-CM | POA: Diagnosis not present

## 2023-06-09 DIAGNOSIS — I2510 Atherosclerotic heart disease of native coronary artery without angina pectoris: Secondary | ICD-10-CM

## 2023-06-09 DIAGNOSIS — Z79899 Other long term (current) drug therapy: Secondary | ICD-10-CM | POA: Diagnosis not present

## 2023-06-09 DIAGNOSIS — Z951 Presence of aortocoronary bypass graft: Secondary | ICD-10-CM | POA: Diagnosis not present

## 2023-06-09 HISTORY — PX: RIGHT/LEFT HEART CATH AND CORONARY ANGIOGRAPHY: CATH118266

## 2023-06-09 LAB — POCT I-STAT EG7
Acid-base deficit: 10 mmol/L — ABNORMAL HIGH (ref 0.0–2.0)
Bicarbonate: 17.3 mmol/L — ABNORMAL LOW (ref 20.0–28.0)
Calcium, Ion: 0.96 mmol/L — ABNORMAL LOW (ref 1.15–1.40)
HCT: 41 % (ref 36.0–46.0)
Hemoglobin: 13.9 g/dL (ref 12.0–15.0)
O2 Saturation: 60 %
Potassium: 2.9 mmol/L — ABNORMAL LOW (ref 3.5–5.1)
Sodium: 114 mmol/L — CL (ref 135–145)
TCO2: 19 mmol/L — ABNORMAL LOW (ref 22–32)
pCO2, Ven: 43.4 mmHg — ABNORMAL LOW (ref 44–60)
pH, Ven: 7.208 — ABNORMAL LOW (ref 7.25–7.43)
pO2, Ven: 38 mmHg (ref 32–45)

## 2023-06-09 LAB — POCT I-STAT 7, (LYTES, BLD GAS, ICA,H+H)
Acid-base deficit: 2 mmol/L (ref 0.0–2.0)
Bicarbonate: 21.5 mmol/L (ref 20.0–28.0)
Calcium, Ion: 1.03 mmol/L — ABNORMAL LOW (ref 1.15–1.40)
HCT: 38 % (ref 36.0–46.0)
Hemoglobin: 12.9 g/dL (ref 12.0–15.0)
O2 Saturation: 96 %
Potassium: 3.5 mmol/L (ref 3.5–5.1)
Sodium: 141 mmol/L (ref 135–145)
TCO2: 22 mmol/L (ref 22–32)
pCO2 arterial: 33.5 mmHg (ref 32–48)
pH, Arterial: 7.414 (ref 7.35–7.45)
pO2, Arterial: 78 mmHg — ABNORMAL LOW (ref 83–108)

## 2023-06-09 SURGERY — RIGHT/LEFT HEART CATH AND CORONARY ANGIOGRAPHY
Anesthesia: LOCAL

## 2023-06-09 MED ORDER — LIDOCAINE HCL (PF) 1 % IJ SOLN
INTRAMUSCULAR | Status: AC
  Filled 2023-06-09: qty 30

## 2023-06-09 MED ORDER — HEPARIN (PORCINE) IN NACL 1000-0.9 UT/500ML-% IV SOLN
INTRAVENOUS | Status: DC | PRN
  Administered 2023-06-09 (×2): 500 mL

## 2023-06-09 MED ORDER — ACETAMINOPHEN 325 MG PO TABS: 650.0000 mg | ORAL_TABLET | ORAL | Status: DC | PRN

## 2023-06-09 MED ORDER — SODIUM CHLORIDE 0.9 % WEIGHT BASED INFUSION: 3.0000 mL/kg/h | INTRAVENOUS | Status: AC

## 2023-06-09 MED ORDER — HYDRALAZINE HCL 20 MG/ML IJ SOLN: 10.0000 mg | INTRAMUSCULAR | Status: DC | PRN

## 2023-06-09 MED ORDER — IOHEXOL 350 MG/ML SOLN
INTRAVENOUS | Status: DC | PRN
  Administered 2023-06-09: 50 mL

## 2023-06-09 MED ORDER — SODIUM CHLORIDE 0.9 % IV SOLN: INTRAVENOUS | Status: AC

## 2023-06-09 MED ORDER — HEPARIN SODIUM (PORCINE) 1000 UNIT/ML IJ SOLN
INTRAMUSCULAR | Status: AC
  Filled 2023-06-09: qty 10

## 2023-06-09 MED ORDER — SODIUM CHLORIDE 0.9% FLUSH: 3.0000 mL | INTRAVENOUS | Status: DC | PRN

## 2023-06-09 MED ORDER — LIDOCAINE HCL (PF) 1 % IJ SOLN
INTRAMUSCULAR | Status: DC | PRN
  Administered 2023-06-09 (×2): 2 mL via INTRADERMAL

## 2023-06-09 MED ORDER — SODIUM CHLORIDE 0.9 % IV SOLN: 250.0000 mL | INTRAVENOUS | Status: DC | PRN

## 2023-06-09 MED ORDER — ONDANSETRON HCL 4 MG/2ML IJ SOLN: 4.0000 mg | Freq: Four times a day (QID) | INTRAMUSCULAR | Status: DC | PRN

## 2023-06-09 MED ORDER — LABETALOL HCL 5 MG/ML IV SOLN
10.0000 mg | INTRAVENOUS | Status: DC | PRN
  Administered 2023-06-09: 10 mg via INTRAVENOUS
  Filled 2023-06-09: qty 4

## 2023-06-09 MED ORDER — SODIUM CHLORIDE 0.9% FLUSH: 3.0000 mL | Freq: Two times a day (BID) | INTRAVENOUS | Status: DC

## 2023-06-09 MED ORDER — VERAPAMIL HCL 2.5 MG/ML IV SOLN
INTRAVENOUS | Status: AC
  Filled 2023-06-09: qty 2

## 2023-06-09 MED ORDER — ASPIRIN 81 MG PO CHEW: 81.0000 mg | CHEWABLE_TABLET | ORAL | Status: DC

## 2023-06-09 MED ORDER — SODIUM CHLORIDE 0.9 % WEIGHT BASED INFUSION: 1.0000 mL/kg/h | INTRAVENOUS | Status: DC

## 2023-06-09 SURGICAL SUPPLY — 12 items
CATH BALLN WEDGE 5F 110CM (CATHETERS) IMPLANT
CATH INFINITI 5FR MULTPACK ANG (CATHETERS) IMPLANT
CLOSURE MYNX CONTROL 5F (Vascular Products) IMPLANT
KIT HEART LEFT (KITS) ×2 IMPLANT
KIT SINGLE USE MANIFOLD (KITS) IMPLANT
PACK CARDIAC CATHETERIZATION (CUSTOM PROCEDURE TRAY) ×2 IMPLANT
SET ATX-X65L (MISCELLANEOUS) IMPLANT
SHEATH GLIDE SLENDER 4/5FR (SHEATH) IMPLANT
SHEATH PINNACLE 5F 10CM (SHEATH) IMPLANT
SHEATH PROBE COVER 6X72 (BAG) IMPLANT
TRANSDUCER W/STOPCOCK (MISCELLANEOUS) ×2 IMPLANT
WIRE EMERALD 3MM-J .035X150CM (WIRE) IMPLANT

## 2023-06-09 NOTE — Discharge Instructions (Signed)

## 2023-06-09 NOTE — Progress Notes (Signed)
Client states she has no one to stay with her tonight

## 2023-06-09 NOTE — Interval H&P Note (Signed)
History and Physical Interval Note:  06/09/2023 11:46 AM  Marinda Elk  has presented today for surgery, with the diagnosis of aortic stenosis.  The various methods of treatment have been discussed with the patient and family. After consideration of risks, benefits and other options for treatment, the patient has consented to  Procedure(s): RIGHT/LEFT HEART CATH AND CORONARY ANGIOGRAPHY (N/A) as a surgical intervention.  The patient's history has been reviewed, patient examined, no change in status, stable for surgery.  I have reviewed the patient's chart and labs.  Questions were answered to the patient's satisfaction.    Cath Lab Visit (complete for each Cath Lab visit)  Clinical Evaluation Leading to the Procedure:   ACS: No.  Non-ACS:    Anginal Classification: CCS II  Anti-ischemic medical therapy: Minimal Therapy (1 class of medications)  Non-Invasive Test Results: No non-invasive testing performed  Prior CABG: Previous CABG        Verne Carrow

## 2023-06-10 ENCOUNTER — Encounter (HOSPITAL_COMMUNITY): Payer: Self-pay | Admitting: Cardiovascular Disease

## 2023-06-10 ENCOUNTER — Telehealth: Payer: Self-pay | Admitting: Cardiovascular Disease

## 2023-06-10 NOTE — Telephone Encounter (Signed)
Pt called in stating that she is due to take her dressing off, but was stating her panties would rub the very spot where her dressing is. Advised her that it's a pressure dressing and she can remove it and replaced with standard band-aid or other gauze covering to prevent rubbing. Verbalized understanding.

## 2023-06-10 NOTE — Telephone Encounter (Signed)
Pt calling to speak with Maralyn Sago. She states she had surgery yesterday and needs to speak with her about some clothing rubbing issues.  Please advise.

## 2023-06-12 NOTE — Progress Notes (Unsigned)
[address]       [place] [address]             [phone]           JASHLEY CRONKRIGHT [facility] Medical Record #[id number] Date of Birth: 01/26/1941  Tonny Bollman, MD Pincus Sanes, MD  Chief Complaint: Aortic stenosis    History of Present Illness:     Pt is an 82 yo female who is here to discuss her issues with her heart valve. Pt here alone and is not a good historian. She reports having to go to ER for what she thought she was having a stroke with her head "fighting itself on the inside"  She said the ER told her it was weakness. Pt with a history of CABG and has been followed by Dr [name]. She has been noted to have progressive AS with last echo with an EF of 60% and mean aortic gradient of and with a valve area of 0.7cm2 and a SVI of 37 and a DI .32. She has what appears to be a functional bicuspid valve. She also complains of tiredness that has been progressive and DOE. She lives alone and performs all the daily activities but does enjoy going out to dinner rather than making meals. She was felt to be symptomatic from her AS and underwent further work up with cath (all grafts patent) and CTA that has appropriate anatomy for TAVR via femoral approach. She again appears somewhat confused by what is recommended     Past Medical History:  Diagnosis Date   ALLERGIC RHINITIS    Allergy    SEASONAL   Anxiety    Aortic stenosis, moderate    Blood transfusion without reported diagnosis    BREAST CANCER 1991   left, 1991; B mastectomy   Cataract    BILATERAL-REMOVED   Coronary artery disease    s/p CABGx5 2007 //Nuclear stress test 10/18: EF 76, no ischemia or infarction, low risk   Depression    DIVERTICULOSIS    DVT    EROSIVE GASTRITIS    GERD    Heart murmur    History of DVT (deep vein thrombosis)    HYPERLIPIDEMIA    Hypertension    HYPOTHYROIDISM    Occlusion and stenosis of carotid artery without mention of cerebral infarction     Osteopenia    OSTEOPOROSIS    RHINOSINUSITIS, RECURRENT     Past Surgical History:  Procedure Laterality Date   APPENDECTOMY     COLONOSCOPY     CORONARY ARTERY BYPASS GRAFT     5        2007   MASTECTOMY     left with reconstruction and lymph node excision  1992   MASTECTOMY     right with reconstruction   REVISION RECONSTRUCTED BREAST Left 02/2014   willard (plastics in [place])   right ankle arthroscopy     RIGHT/LEFT HEART CATH AND CORONARY ANGIOGRAPHY N/A 06/09/2023   Procedure: RIGHT/LEFT HEART CATH AND CORONARY ANGIOGRAPHY;  Surgeon: Kathleene Hazel, MD;  Location: [facility] INVASIVE CV LAB;  Service: Cardiovascular;  Laterality: N/A;   TEAR DUCT CANAL     TONSILLECTOMY AND ADENOIDECTOMY     UPPER GASTROINTESTINAL ENDOSCOPY     VAGINAL HYSTERECTOMY     ovaries not excised    Social History   Tobacco Use  Smoking Status Never  Smokeless Tobacco Never    Social History  Substance and Sexual Activity  Alcohol Use No   Alcohol/week: 0.0 standard drinks of alcohol    Social History   Socioeconomic History   Marital status: Divorced    Spouse name: Not on file   Number of children: 2   Years of education: Not on file   Highest education level: Not on file  Occupational History   Occupation: retired  Tobacco Use   Smoking status: Never   Smokeless tobacco: Never  Vaping Use   Vaping status: Never Used  Substance and Sexual Activity   Alcohol use: No    Alcohol/week: 0.0 standard drinks of alcohol   Drug use: No   Sexual activity: Not Currently  Other Topics Concern   Not on file  Social History Narrative   Divorced x 3, lives alone   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, school psychologist   Right handed    Social Determinants of Health   Financial Resource Strain: Low Risk  (09/02/2022)   Overall Financial Resource Strain (CARDIA)    Difficulty of Paying Living Expenses: Not hard at all  Food Insecurity: No Food Insecurity (09/02/2022)    Hunger Vital Sign    Worried About Running Out of Food in the Last Year: Never true    Ran Out of Food in the Last Year: Never true  Transportation Needs: No Transportation Needs (09/02/2022)   PRAPARE - Administrator, Civil Service (Medical): No    Lack of Transportation (Non-Medical): No  Physical Activity: Inactive (09/02/2022)   Exercise Vital Sign    Days of Exercise per Week: 0 days    Minutes of Exercise per Session: 0 min  Stress: No Stress Concern Present (09/02/2022)   [employer] of Occupational Health - Occupational Stress Questionnaire    Feeling of Stress : Not at all  Social Connections: Moderately Integrated (09/02/2022)   Social Connection and Isolation Panel [NHANES]    Frequency of Communication with Friends and Family: More than three times a week    Frequency of Social Gatherings with Friends and Family: More than three times a week    Attends Religious Services: More than 4 times per year    Active Member of [employer] or Organizations: Yes    Attends Engineer, structural: More than 4 times per year    Marital Status: Divorced  Intimate Partner Violence: Not At Risk (09/02/2022)   Humiliation, Afraid, Rape, and Kick questionnaire    Fear of Current or Ex-Partner: No    Emotionally Abused: No    Physically Abused: No    Sexually Abused: No    Allergies  Allergen Reactions   Clarithromycin Other (See Comments)    PATIENT UNSURE OF REACTION   Clindamycin Other (See Comments)   Codeine     REACTION: nausea   Levofloxacin     REACTION: nausea   Morphine Other (See Comments)    Nausea  Nausea    Naproxen     REACTION: nausea   Omeprazole Other (See Comments)    Made her jittery, made her feel weird, ?rash   Penicillins Other (See Comments) and Rash    REACTION: RASH REACTION: RASH    Pneumococcal Vaccines Rash    Small rash on arm at injection site.     Current Outpatient Medications  Medication Sig Dispense Refill    aspirin 81 MG tablet Take 81 mg by mouth daily.       atorvastatin (LIPITOR) 20 MG tablet  TAKE 1 TABLET BY MOUTH EVERY DAY (Patient taking differently: Take 20 mg by mouth See admin instructions. TAKE 1 TABLET BY MOUTH EVERY DAY) 90 tablet 1   Calcium Carbonate (CALTRATE 600 PO) Take 1 tablet by mouth 2 (two) times daily.       citalopram (CELEXA) 20 MG tablet TAKE 1 TABLET BY MOUTH EVERY DAY 90 tablet 2   co-enzyme Q-10 30 MG capsule Take 30 mg by mouth daily.     empagliflozin (JARDIANCE) 10 MG TABS tablet Take 1 tablet (10 mg total) by mouth daily before breakfast. 30 tablet 11   famotidine (PEPCID) 40 MG tablet Take 1 tablet (40 mg total) by mouth daily. 90 tablet 1   fluticasone (FLONASE) 50 MCG/ACT nasal spray Place 1 spray into both nostrils daily as needed for allergies or rhinitis. 9.9 mL 5   furosemide (LASIX) 20 MG tablet TAKE 1 TABLET BY MOUTH EVERY DAY 90 tablet 0   levothyroxine (SYNTHROID) 75 MCG tablet TAKE 1 TABLET BY MOUTH EVERY MORNING BEFORE BREAKFAST 90 tablet 1   losartan (COZAAR) 50 MG tablet TAKE 1 TABLET BY MOUTH EVERY DAY 90 tablet 3   meloxicam (MOBIC) 7.5 MG tablet TAKE 1/2 TO 1 TABLET EVERY DAY 90 tablet 0   metoprolol tartrate (LOPRESSOR) 25 MG tablet Take 1 tablet (25 mg total) by mouth 2 (two) times daily. 180 tablet 1   Multiple Vitamins-Minerals (MULTIVITAMIN) LIQD Take 1 tablet by mouth daily.     Multiple Vitamins-Minerals (PRESERVISION AREDS 2) CAPS Take 1 capsule by mouth 2 (two) times daily.     senna-docusate (SENOKOT-S) 8.6-50 MG per tablet Take 1 tablet by mouth daily.       VITAMIN D, CHOLECALCIFEROL, PO Take 1 tablet by mouth daily.     zolpidem (AMBIEN) 5 MG tablet Take 1 tablet (5 mg total) by mouth at bedtime as needed. for sleep (Patient taking differently: Take 5 mg by mouth at bedtime as needed for sleep.) 30 tablet 5   No current facility-administered medications for this visit.     Family History  Problem Relation Age of Onset   Colon  cancer Sister    Colon cancer Sister    Breast cancer Mother    Lung disease Mother    Emphysema Father    Esophageal cancer Neg Hx    Rectal cancer Neg Hx    Stomach cancer Neg Hx    Thyroid disease Neg Hx        Physical Exam: Elderly appearing Lungs: clear Card: RR with harsh  Neuro: appears intact but easily confused     Diagnostic Studies & Laboratory data: I have personally reviewed the following studies and agree with the findings   TTE (02/2023) IMPRESSIONS     1. Left ventricular ejection fraction, by estimation, is 60 to 65%. Left  ventricular ejection fraction by PLAX is 64 %. The left ventricle has  normal function. The left ventricle has no regional wall motion  abnormalities. There is mild left ventricular  hypertrophy. Left ventricular diastolic parameters are consistent with  Grade III diastolic dysfunction (restrictive). Elevated left ventricular  end-diastolic pressure.   2. Right ventricular systolic function is low normal. The right  ventricular size is normal. There is moderately elevated pulmonary artery  systolic pressure. The estimated right ventricular systolic pressure is  51.6 mmHg.   3. Left atrial size was moderately dilated.   4. The mitral valve is degenerative. Trivial mitral valve regurgitation.   5. The aortic valve  is calcified. There is severe calcifcation of the  aortic valve. Aortic valve regurgitation is trivial. Moderate to severe  aortic valve stenosis. Aortic regurgitation PHT measures 572 msec. Aortic  valve area, by VTI measures 0.72 cm.   Aortic valve mean gradient measures 25.0 mmHg. Aortic valve Vmax measures  3.32 m/s. Peak gradient 44 mmHg, DI 0.32.   6. The pulmonic valve was abnormal.   7. Aortic dilatation noted. There is borderline dilatation of the  ascending aorta, measuring 39 mm.   8. The inferior vena cava is normal in size with <50% respiratory  variability, suggesting right atrial pressure of 8 mmHg.    Comparison(s): Changes from prior study are noted. 07/23/2022: LVEF 60-65%,  mild to moderate AS - mean gradient 19 mmHg.   FINDINGS   Left Ventricle: Left ventricular ejection fraction, by estimation, is 60  to 65%. Left ventricular ejection fraction by PLAX is 64 %. The left  ventricle has normal function. The left ventricle has no regional wall  motion abnormalities. The left  ventricular internal cavity size was normal in size. There is mild left  ventricular hypertrophy. Left ventricular diastolic parameters are  consistent with Grade III diastolic dysfunction (restrictive). Elevated  left ventricular end-diastolic pressure.   Right Ventricle: The right ventricular size is normal. No increase in  right ventricular wall thickness. Right ventricular systolic function is  low normal. There is moderately elevated pulmonary artery systolic  pressure. The tricuspid regurgitant velocity   is 3.30 m/s, and with an assumed right atrial pressure of 8 mmHg, the  estimated right ventricular systolic pressure is 51.6 mmHg.   Left Atrium: Left atrial size was moderately dilated.   Right Atrium: Right atrial size was normal in size.   Pericardium: There is no evidence of pericardial effusion.   Mitral Valve: The mitral valve is degenerative in appearance. There is  moderate calcification of the posterior and anterior mitral valve  leaflet(s). Mild to moderate mitral annular calcification. Trivial mitral  valve regurgitation.   Tricuspid Valve: The tricuspid valve is grossly normal. Tricuspid valve  regurgitation is mild.   Aortic Valve: The aortic valve is calcified. There is severe calcifcation  of the aortic valve. Aortic valve regurgitation is trivial. Aortic  regurgitation PHT measures 572 msec. Moderate to severe aortic stenosis is  present. Aortic valve mean gradient  measures 25.0 mmHg. Aortic valve peak gradient measures 44.1 mmHg. Aortic  valve area, by VTI measures 0.72 cm.    Pulmonic Valve: The pulmonic valve was abnormal. Pulmonic valve  regurgitation is mild to moderate.   Aorta: Aortic dilatation noted. There is borderline dilatation of the  ascending aorta, measuring 39 mm.   Venous: The inferior vena cava is normal in size with less than 50%  respiratory variability, suggesting right atrial pressure of 8 mmHg.   IAS/Shunts: No atrial level shunt detected by color flow Doppler.     LEFT VENTRICLE  PLAX 2D  LV EF:         Left            Diastology                 ventricular     LV e' medial:    4.40 cm/s                 ejection        LV E/e' medial:  32.0  fraction by     LV e' lateral:   6.74 cm/s                 PLAX is 64      LV E/e' lateral: 20.9                 %.  LVIDd:         3.80 cm  LVIDs:         2.50 cm  LV PW:         1.00 cm  LV IVS:        1.00 cm  LVOT diam:     1.70 cm  LV SV:         58  LV SV Index:   37  LVOT Area:     2.27 cm     RIGHT VENTRICLE             IVC  RV Basal diam:  4.00 cm     IVC diam: 1.50 cm  RV S prime:     11.00 cm/s  TAPSE (M-mode): 2.2 cm   LEFT ATRIUM             Index        RIGHT ATRIUM           Index  LA diam:        4.20 cm 2.67 cm/m   RA Area:     11.70 cm  LA Vol (A2C):   48.7 ml 30.97 ml/m  RA Volume:   26.90 ml  17.10 ml/m  LA Vol (A4C):   55.0 ml 34.97 ml/m  LA Biplane Vol: 53.6 ml 34.08 ml/m   AORTIC VALVE  AV Area (Vmax):    0.63 cm  AV Area (Vmean):   0.62 cm  AV Area (VTI):     0.72 cm  AV Vmax:           332.00 cm/s  AV Vmean:          231.000 cm/s  AV VTI:            0.810 m  AV Peak Grad:      44.1 mmHg  AV Mean Grad:      25.0 mmHg  LVOT Vmax:         92.40 cm/s  LVOT Vmean:        63.400 cm/s  LVOT VTI:          0.256 m  LVOT/AV VTI ratio: 0.32  AI PHT:            572 msec    AORTA  Ao Root diam: 3.00 cm  Ao Asc diam:  3.90 cm   MITRAL VALVE                TRICUSPID VALVE  MV Area (PHT): 3.48 cm     TR Peak grad:   43.6 mmHg  MV  Decel Time: 218 msec     TR Vmax:        330.00 cm/s  MV E velocity: 141.00 cm/s  MV A velocity: 43.60 cm/s   SHUNTS  MV E/A ratio:  3.23         Systemic VTI:  0.26 m                              Systemic Diam: 1.70 cm   CATH (05/2023) Conclusion  Mid RCA lesion is 100% stenosed.   Prox Cx lesion is 99% stenosed.   Ost LAD to Prox LAD lesion is 90% stenosed.   SVG graft was visualized by angiography and is normal in caliber.   SVG graft was visualized by angiography and is normal in caliber.   LIMA graft was visualized by angiography and is normal in caliber.   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 Patent sequential vein graft to the LAD and Diagonal Severe proximal Circumflex stenosis. Patent LIMA graft to the obtuse marginal branch Chronic total occlusion of the mid RCA. The PDA and posterolateral arteries fill from the sequential vein graft.  Elevated right heart pressures  RA 7 RV 47/7/9 PA 48/19 mean 35 PCWP 26    Recent Radiology Findings:  CTA (02/2023) FINDINGS: Aortic Valve: Functionally bicuspid with fused right and left cusp AV with calcium score 874   Aorta: No aneurysm normal arch vessels moderate calcific atherosclerosis   Sinotubular Junction: 26.4 mm   Ascending Thoracic Aorta: 34 mm   Aortic Arch: 25 mm   Descending Thoracic Aorta: 24 mm   Sinus of Valsalva Measurements:   Non-coronary: 31.4 mm  Height 23.1 mm   Right - coronary: 28.6 mm  Height 16.2 mm   Left - coronary: 30.8 mm  Height 18.4 mm   Coronary Artery Height above Annulus:   Left Main: 13.9 mm above annulus   Right Coronary: 9.3 mm above annulus   Virtual Basal Annulus Measurements:   Maximum/Minimum Diameter: 23.3 mm x 18.6 mm Average diameter 21.2 mm   Perimeter: 67.7 mm   Area: 353 mm2   Coronary Arteries: Sufficient height above annulus for deployment and patient has by pass grafts   Optimum Fluoroscopic  Angle for Delivery: LAO 7 Caudal 6 degrees   IMPRESSION: 1. Functionally bicuspid AV with fused right and left cusp score 874   2. Annular area of 353 mm2 suitable for a 23 mm Sapien 3 valve Alternatively a 26 Medtronic Evolut valve would fit   3.  Optimum angiographic angle for deployment LAO 7 Caudal 6 degrees   4.  Coronary arteries sufficient height above annulus for deployment   5.  There are 2 patent SVGls and a patent LIMA   6.  Membranous septal length 9.5 mm        Recent Lab Findings: Lab Results  Component Value Date   WBC 5.2 06/02/2023   HGB 12.9 06/09/2023   HCT 38.0 06/09/2023   PLT 241 06/02/2023   GLUCOSE 81 06/02/2023   CHOL 132 02/02/2023   TRIG 77.0 02/02/2023   HDL 55.40 02/02/2023   LDLCALC 61 02/02/2023   ALT 18 06/02/2023   AST 30 06/02/2023   NA 141 06/09/2023   K 3.5 06/09/2023   CL 104 06/02/2023   CREATININE 0.71 06/02/2023   BUN 12 06/02/2023   CO2 19 (L) 06/02/2023   TSH 2.471 06/02/2023   INR 1.0 ratio 10/26/2010   HGBA1C 5.9 02/02/2023      Assessment / Plan:    82 yo female with NYHA class 2 symptoms refferable to her AS with normal LV function and patent grafts after CABG. She appears to have indications for AVR and with her age and comorbidities would best be done with TAVR and she is not a bailout candidate. Will need to finalize size and type of valve at valve conference secondary to her being on the cusp of  sapien sizes.    I have spent 60 min in review of the records, viewing studies and in face to face with patient and in coordination of future care    Eugenio Hoes 06/12/2023 11:58 AM

## 2023-06-13 ENCOUNTER — Institutional Professional Consult (permissible substitution): Payer: Medicare Other | Admitting: Thoracic Surgery (Cardiothoracic Vascular Surgery)

## 2023-06-13 ENCOUNTER — Encounter: Payer: Self-pay | Admitting: Thoracic Surgery (Cardiothoracic Vascular Surgery)

## 2023-06-13 VITALS — BP 141/68 | HR 68 | Resp 18 | Ht 62.0 in | Wt 124.0 lb

## 2023-06-13 DIAGNOSIS — I350 Nonrheumatic aortic (valve) stenosis: Secondary | ICD-10-CM

## 2023-06-13 NOTE — Patient Instructions (Signed)
TAVR

## 2023-06-14 ENCOUNTER — Encounter: Payer: Self-pay | Admitting: Physician Assistant

## 2023-06-14 ENCOUNTER — Other Ambulatory Visit: Payer: Self-pay | Admitting: Physician Assistant

## 2023-06-14 DIAGNOSIS — I350 Nonrheumatic aortic (valve) stenosis: Secondary | ICD-10-CM

## 2023-06-16 ENCOUNTER — Encounter: Payer: Self-pay | Admitting: Neurology

## 2023-06-17 ENCOUNTER — Ambulatory Visit (HOSPITAL_COMMUNITY)
Admission: RE | Admit: 2023-06-17 | Discharge: 2023-06-17 | Disposition: A | Discharge status: Home / Self Care | Payer: Medicare Other | Source: Ambulatory Visit | Attending: Physician Assistant | Admitting: Physician Assistant

## 2023-06-17 ENCOUNTER — Encounter (HOSPITAL_COMMUNITY)
Admission: RE | Admit: 2023-06-17 | Discharge: 2023-06-17 | Disposition: A | Discharge status: Home / Self Care | Payer: Medicare Other | Source: Ambulatory Visit | Attending: Cardiovascular Disease | Admitting: Cardiovascular Disease

## 2023-06-17 ENCOUNTER — Other Ambulatory Visit: Payer: Self-pay

## 2023-06-17 DIAGNOSIS — I350 Nonrheumatic aortic (valve) stenosis: Secondary | ICD-10-CM

## 2023-06-17 DIAGNOSIS — Z1152 Encounter for screening for COVID-19: Secondary | ICD-10-CM | POA: Insufficient documentation

## 2023-06-17 DIAGNOSIS — Z01818 Encounter for other preprocedural examination: Secondary | ICD-10-CM

## 2023-06-17 LAB — PROTIME-INR
INR: 1.1 (ref 0.8–1.2)
Prothrombin Time: 14.2 seconds (ref 11.4–15.2)

## 2023-06-17 LAB — CBC
HCT: 41.7 % (ref 36.0–46.0)
Hemoglobin: 13.6 g/dL (ref 12.0–15.0)
MCH: 29.8 pg (ref 26.0–34.0)
MCHC: 32.6 g/dL (ref 30.0–36.0)
MCV: 91.4 fL (ref 80.0–100.0)
Platelets: 261 10*3/uL (ref 150–400)
RBC: 4.56 MIL/uL (ref 3.87–5.11)
RDW: 14.1 % (ref 11.5–15.5)
WBC: 5.3 10*3/uL (ref 4.0–10.5)
nRBC: 0 % (ref 0.0–0.2)

## 2023-06-17 LAB — URINALYSIS, ROUTINE W REFLEX MICROSCOPIC
Bilirubin Urine: NEGATIVE
Glucose, UA: NEGATIVE mg/dL
Hgb urine dipstick: NEGATIVE
Ketones, ur: NEGATIVE mg/dL
Leukocytes,Ua: NEGATIVE
Nitrite: NEGATIVE
Protein, ur: NEGATIVE mg/dL
Specific Gravity, Urine: 1.01 (ref 1.005–1.030)
pH: 6 (ref 5.0–8.0)

## 2023-06-17 LAB — COMPREHENSIVE METABOLIC PANEL
ALT: 20 U/L (ref 0–44)
AST: 27 U/L (ref 15–41)
Albumin: 3.7 g/dL (ref 3.5–5.0)
Alkaline Phosphatase: 61 U/L (ref 38–126)
Anion gap: 9 (ref 5–15)
BUN: 14 mg/dL (ref 8–23)
CO2: 27 mmol/L (ref 22–32)
Calcium: 9 mg/dL (ref 8.9–10.3)
Chloride: 98 mmol/L (ref 98–111)
Creatinine, Ser: 0.71 mg/dL (ref 0.44–1.00)
GFR, Estimated: >60 mL/min (ref >60)
Glucose, Bld: 83 mg/dL (ref 70–99)
Potassium: 3.7 mmol/L (ref 3.5–5.1)
Sodium: 134 mmol/L — ABNORMAL LOW (ref 135–145)
Total Bilirubin: 1.3 mg/dL — ABNORMAL HIGH (ref 0.3–1.2)
Total Protein: 6.6 g/dL (ref 6.5–8.1)

## 2023-06-17 LAB — SURGICAL PCR SCREEN
MRSA, PCR: NEGATIVE
Staphylococcus aureus: NEGATIVE

## 2023-06-17 LAB — TYPE AND SCREEN
ABO/RH(D): O POS
Antibody Screen: NEGATIVE

## 2023-06-17 NOTE — Progress Notes (Signed)
Patient signed all consents at PAT lab appointment. CHG soap and instructions were given to patient. CHG surgical prep reviewed with patient and all questions answered.  Patients chart send to anesthesia for review.  

## 2023-06-18 ENCOUNTER — Other Ambulatory Visit: Payer: Self-pay | Admitting: Internal Medicine

## 2023-06-20 MED ORDER — NOREPINEPHRINE 4 MG/250ML-% IV SOLN
0.0000 ug/min | INTRAVENOUS | Status: DC
  Filled 2023-06-20: qty 250

## 2023-06-20 MED ORDER — DEXMEDETOMIDINE HCL IN NACL 400 MCG/100ML IV SOLN
0.1000 ug/kg/h | INTRAVENOUS | Status: AC
  Administered 2023-06-21: 1 ug/kg/h via INTRAVENOUS
  Filled 2023-06-20: qty 100

## 2023-06-20 MED ORDER — MAGNESIUM SULFATE 50 % IJ SOLN
40.0000 meq | INTRAMUSCULAR | Status: DC
  Filled 2023-06-20 (×2): qty 9.85

## 2023-06-20 MED ORDER — HEPARIN 30,000 UNITS/1000 ML (OHS) CELLSAVER SOLUTION
Status: DC
  Filled 2023-06-20 (×2): qty 1000

## 2023-06-20 MED ORDER — POTASSIUM CHLORIDE 2 MEQ/ML IV SOLN
80.0000 meq | INTRAVENOUS | Status: DC
  Filled 2023-06-20 (×2): qty 40

## 2023-06-20 MED ORDER — CEFAZOLIN SODIUM-DEXTROSE 2-4 GM/100ML-% IV SOLN
2.0000 g | INTRAVENOUS | Status: AC
  Administered 2023-06-21: 2 g via INTRAVENOUS
  Filled 2023-06-20: qty 100

## 2023-06-20 NOTE — H&P (Signed)
[address]       [place] [address]             [phone]                                   [name] [facility] Medical Record #[id number] Date of Birth: 16-Oct-1941   Angelica Bollman, MD Pincus Sanes, MD   Chief Complaint: Aortic stenosis     History of Present Illness:     Pt is an 82 yo female who is here to discuss her issues with her heart valve. Pt here alone and is not a good historian. She reports having to go to ER for what she thought she was having a stroke with her head "fighting itself on the inside"  She said the ER told her it was weakness. Pt with a history of CABG and has been followed by Dr [name]. She has been noted to have progressive AS with last echo with an EF of 60% and mean aortic gradient of and with a valve area of 0.7cm2 and a SVI of 37 and a DI .32. She has what appears to be a functional bicuspid valve. She also complains of tiredness that has been progressive and DOE. She lives alone and performs all the daily activities but does enjoy going out to dinner rather than making meals. She was felt to be symptomatic from her AS and underwent further work up with cath (all grafts patent) and CTA that has appropriate anatomy for TAVR via femoral approach. She again appears somewhat confused by what is recommended           Past Medical History:  Diagnosis Date   ALLERGIC RHINITIS     Allergy      SEASONAL   Anxiety     Aortic stenosis, moderate     Blood transfusion without reported diagnosis     BREAST CANCER 1991    left, 1991; B mastectomy   Cataract      BILATERAL-REMOVED   Coronary artery disease      s/p CABGx5 2007 //Nuclear stress test 10/18: EF 76, no ischemia or infarction, low risk   Depression     DIVERTICULOSIS     DVT     EROSIVE GASTRITIS     GERD     Heart murmur     History of DVT (deep vein thrombosis)     HYPERLIPIDEMIA     Hypertension     HYPOTHYROIDISM     Occlusion and stenosis of  carotid artery without mention of cerebral infarction     Osteopenia     OSTEOPOROSIS     RHINOSINUSITIS, RECURRENT                 Past Surgical History:  Procedure Laterality Date   APPENDECTOMY       COLONOSCOPY       CORONARY ARTERY BYPASS GRAFT        5        2007   MASTECTOMY        left with reconstruction and lymph node excision  1992   MASTECTOMY        right with reconstruction   REVISION RECONSTRUCTED BREAST Left 02/2014    willard (plastics in [place])   right ankle arthroscopy       RIGHT/LEFT HEART CATH AND CORONARY ANGIOGRAPHY  N/A 06/09/2023    Procedure: RIGHT/LEFT HEART CATH AND CORONARY ANGIOGRAPHY;  Surgeon: Kathleene Hazel, MD;  Location: [facility];  Service: Cardiovascular;  Laterality: N/A;   TEAR DUCT CANAL       TONSILLECTOMY AND ADENOIDECTOMY       UPPER GASTROINTESTINAL ENDOSCOPY       VAGINAL HYSTERECTOMY        ovaries not excised          Tobacco Use History  Social History       Tobacco Use  Smoking Status Never  Smokeless Tobacco Never      Social History        Substance and Sexual Activity  Alcohol Use No   Alcohol/week: 0.0 standard drinks of alcohol      Social History         Socioeconomic History   Marital status: Divorced      Spouse name: Not on file   Number of children: 2   Years of education: Not on file   Highest education level: Not on file  Occupational History   Occupation: retired  Tobacco Use   Smoking status: Never   Smokeless tobacco: Never  Vaping Use   Vaping status: Never Used  Substance and Sexual Activity   Alcohol use: No      Alcohol/week: 0.0 standard drinks of alcohol   Drug use: No   Sexual activity: Not Currently  Other Topics Concern   Not on file  Social History Narrative    Divorced x 3, lives alone    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, school psychologist    Right handed     Social Determinants of Health        Financial Resource Strain: Low Risk   (09/02/2022)    Overall Financial Resource Strain (CARDIA)     Difficulty of Paying Living Expenses: Not hard at all  Food Insecurity: No Food Insecurity (09/02/2022)    Hunger Vital Sign     Worried About Running Out of Food in the Last Year: Never true     Ran Out of Food in the Last Year: Never true  Transportation Needs: No Transportation Needs (09/02/2022)    PRAPARE - Therapist, art (Medical): No     Lack of Transportation (Non-Medical): No  Physical Activity: Inactive (09/02/2022)    Exercise Vital Sign     Days of Exercise per Week: 0 days     Minutes of Exercise per Session: 0 min  Stress: No Stress Concern Present (09/02/2022)    [employer] of Occupational Health - Occupational Stress Questionnaire     Feeling of Stress : Not at all  Social Connections: Moderately Integrated (09/02/2022)    Social Connection and Isolation Panel [NHANES]     Frequency of Communication with Friends and Family: More than three times a week     Frequency of Social Gatherings with Friends and Family: More than three times a week     Attends Religious Services: More than 4 times per year     Active Member of [employer] or Organizations: Yes     Attends Banker Meetings: More than 4 times per year     Marital Status: Divorced  Intimate Partner Violence: Not At Risk (09/02/2022)    Humiliation, Afraid, Rape, and Kick questionnaire     Fear of Current or Ex-Partner: No     Emotionally Abused: No  Physically Abused: No     Sexually Abused: No      Allergies       Allergies  Allergen Reactions   Clarithromycin Other (See Comments)      PATIENT UNSURE OF REACTION   Clindamycin Other (See Comments)   Codeine        REACTION: nausea   Levofloxacin        REACTION: nausea   Morphine Other (See Comments)      Nausea   Nausea     Naproxen        REACTION: nausea   Omeprazole Other (See Comments)      Made her jittery, made her feel weird,  ?rash   Penicillins Other (See Comments) and Rash      REACTION: RASH REACTION: RASH     Pneumococcal Vaccines Rash      Small rash on arm at injection site.               Current Outpatient Medications  Medication Sig Dispense Refill   aspirin 81 MG tablet Take 81 mg by mouth daily.         atorvastatin (LIPITOR) 20 MG tablet TAKE 1 TABLET BY MOUTH EVERY DAY (Patient taking differently: Take 20 mg by mouth See admin instructions. TAKE 1 TABLET BY MOUTH EVERY DAY) 90 tablet 1   Calcium Carbonate (CALTRATE 600 PO) Take 1 tablet by mouth 2 (two) times daily.         citalopram (CELEXA) 20 MG tablet TAKE 1 TABLET BY MOUTH EVERY DAY 90 tablet 2   co-enzyme Q-10 30 MG capsule Take 30 mg by mouth daily.       empagliflozin (JARDIANCE) 10 MG TABS tablet Take 1 tablet (10 mg total) by mouth daily before breakfast. 30 tablet 11   famotidine (PEPCID) 40 MG tablet Take 1 tablet (40 mg total) by mouth daily. 90 tablet 1   fluticasone (FLONASE) 50 MCG/ACT nasal spray Place 1 spray into both nostrils daily as needed for allergies or rhinitis. 9.9 mL 5   furosemide (LASIX) 20 MG tablet TAKE 1 TABLET BY MOUTH EVERY DAY 90 tablet 0   levothyroxine (SYNTHROID) 75 MCG tablet TAKE 1 TABLET BY MOUTH EVERY MORNING BEFORE BREAKFAST 90 tablet 1   losartan (COZAAR) 50 MG tablet TAKE 1 TABLET BY MOUTH EVERY DAY 90 tablet 3   meloxicam (MOBIC) 7.5 MG tablet TAKE 1/2 TO 1 TABLET EVERY DAY 90 tablet 0   metoprolol tartrate (LOPRESSOR) 25 MG tablet Take 1 tablet (25 mg total) by mouth 2 (two) times daily. 180 tablet 1   Multiple Vitamins-Minerals (MULTIVITAMIN) LIQD Take 1 tablet by mouth daily.       Multiple Vitamins-Minerals (PRESERVISION AREDS 2) CAPS Take 1 capsule by mouth 2 (two) times daily.       senna-docusate (SENOKOT-S) 8.6-50 MG per tablet Take 1 tablet by mouth daily.         VITAMIN D, CHOLECALCIFEROL, PO Take 1 tablet by mouth daily.       zolpidem (AMBIEN) 5 MG tablet Take 1 tablet (5 mg total) by  mouth at bedtime as needed. for sleep (Patient taking differently: Take 5 mg by mouth at bedtime as needed for sleep.) 30 tablet 5      No current facility-administered medications for this visit.               Family History  Problem Relation Age of Onset   Colon cancer Sister  Colon cancer Sister     Breast cancer Mother     Lung disease Mother     Emphysema Father     Esophageal cancer Neg Hx     Rectal cancer Neg Hx     Stomach cancer Neg Hx     Thyroid disease Neg Hx                  Physical Exam: Elderly appearing Lungs: clear Card: RR with harsh  Neuro: appears intact but easily confused         Diagnostic Studies & Laboratory data: I have personally reviewed the following studies and agree with the findings   TTE (02/2023) IMPRESSIONS     1. Left ventricular ejection fraction, by estimation, is 60 to 65%. Left  ventricular ejection fraction by PLAX is 64 %. The left ventricle has  normal function. The left ventricle has no regional wall motion  abnormalities. There is mild left ventricular  hypertrophy. Left ventricular diastolic parameters are consistent with  Grade III diastolic dysfunction (restrictive). Elevated left ventricular  end-diastolic pressure.   2. Right ventricular systolic function is low normal. The right  ventricular size is normal. There is moderately elevated pulmonary artery  systolic pressure. The estimated right ventricular systolic pressure is  51.6 mmHg.   3. Left atrial size was moderately dilated.   4. The mitral valve is degenerative. Trivial mitral valve regurgitation.   5. The aortic valve is calcified. There is severe calcifcation of the  aortic valve. Aortic valve regurgitation is trivial. Moderate to severe  aortic valve stenosis. Aortic regurgitation PHT measures 572 msec. Aortic  valve area, by VTI measures 0.72 cm.   Aortic valve mean gradient measures 25.0 mmHg. Aortic valve Vmax measures  3.32 m/s. Peak  gradient 44 mmHg, DI 0.32.   6. The pulmonic valve was abnormal.   7. Aortic dilatation noted. There is borderline dilatation of the  ascending aorta, measuring 39 mm.   8. The inferior vena cava is normal in size with <50% respiratory  variability, suggesting right atrial pressure of 8 mmHg.   Comparison(s): Changes from prior study are noted. 07/23/2022: LVEF 60-65%,  mild to moderate AS - mean gradient 19 mmHg.   FINDINGS   Left Ventricle: Left ventricular ejection fraction, by estimation, is 60  to 65%. Left ventricular ejection fraction by PLAX is 64 %. The left  ventricle has normal function. The left ventricle has no regional wall  motion abnormalities. The left  ventricular internal cavity size was normal in size. There is mild left  ventricular hypertrophy. Left ventricular diastolic parameters are  consistent with Grade III diastolic dysfunction (restrictive). Elevated  left ventricular end-diastolic pressure.   Right Ventricle: The right ventricular size is normal. No increase in  right ventricular wall thickness. Right ventricular systolic function is  low normal. There is moderately elevated pulmonary artery systolic  pressure. The tricuspid regurgitant velocity   is 3.30 m/s, and with an assumed right atrial pressure of 8 mmHg, the  estimated right ventricular systolic pressure is 51.6 mmHg.   Left Atrium: Left atrial size was moderately dilated.   Right Atrium: Right atrial size was normal in size.   Pericardium: There is no evidence of pericardial effusion.   Mitral Valve: The mitral valve is degenerative in appearance. There is  moderate calcification of the posterior and anterior mitral valve  leaflet(s). Mild to moderate mitral annular calcification. Trivial mitral  valve regurgitation.   Tricuspid Valve: The tricuspid  valve is grossly normal. Tricuspid valve  regurgitation is mild.   Aortic Valve: The aortic valve is calcified. There is severe calcifcation   of the aortic valve. Aortic valve regurgitation is trivial. Aortic  regurgitation PHT measures 572 msec. Moderate to severe aortic stenosis is  present. Aortic valve mean gradient  measures 25.0 mmHg. Aortic valve peak gradient measures 44.1 mmHg. Aortic  valve area, by VTI measures 0.72 cm.   Pulmonic Valve: The pulmonic valve was abnormal. Pulmonic valve  regurgitation is mild to moderate.   Aorta: Aortic dilatation noted. There is borderline dilatation of the  ascending aorta, measuring 39 mm.   Venous: The inferior vena cava is normal in size with less than 50%  respiratory variability, suggesting right atrial pressure of 8 mmHg.   IAS/Shunts: No atrial level shunt detected by color flow Doppler.     LEFT VENTRICLE  PLAX 2D  LV EF:         Left            Diastology                 ventricular     LV e' medial:    4.40 cm/s                 ejection        LV E/e' medial:  32.0                 fraction by     LV e' lateral:   6.74 cm/s                 PLAX is 64      LV E/e' lateral: 20.9                 %.  LVIDd:         3.80 cm  LVIDs:         2.50 cm  LV PW:         1.00 cm  LV IVS:        1.00 cm  LVOT diam:     1.70 cm  LV SV:         58  LV SV Index:   37  LVOT Area:     2.27 cm     RIGHT VENTRICLE             IVC  RV Basal diam:  4.00 cm     IVC diam: 1.50 cm  RV S prime:     11.00 cm/s  TAPSE (M-mode): 2.2 cm   LEFT ATRIUM             Index        RIGHT ATRIUM           Index  LA diam:        4.20 cm 2.67 cm/m   RA Area:     11.70 cm  LA Vol (A2C):   48.7 ml 30.97 ml/m  RA Volume:   26.90 ml  17.10 ml/m  LA Vol (A4C):   55.0 ml 34.97 ml/m  LA Biplane Vol: 53.6 ml 34.08 ml/m   AORTIC VALVE  AV Area (Vmax):    0.63 cm  AV Area (Vmean):   0.62 cm  AV Area (VTI):     0.72 cm  AV Vmax:           332.00 cm/s  AV Vmean:  231.000 cm/s  AV VTI:            0.810 m  AV Peak Grad:      44.1 mmHg  AV Mean Grad:      25.0 mmHg  LVOT Vmax:          92.40 cm/s  LVOT Vmean:        63.400 cm/s  LVOT VTI:          0.256 m  LVOT/AV VTI ratio: 0.32  AI PHT:            572 msec    AORTA  Ao Root diam: 3.00 cm  Ao Asc diam:  3.90 cm   MITRAL VALVE                TRICUSPID VALVE  MV Area (PHT): 3.48 cm     TR Peak grad:   43.6 mmHg  MV Decel Time: 218 msec     TR Vmax:        330.00 cm/s  MV E velocity: 141.00 cm/s  MV A velocity: 43.60 cm/s   SHUNTS  MV E/A ratio:  3.23         Systemic VTI:  0.26 m                              Systemic Diam: 1.70 cm    CATH (05/2023) Conclusion       Mid RCA lesion is 100% stenosed.   Prox Cx lesion is 99% stenosed.   Ost LAD to Prox LAD lesion is 90% stenosed.   SVG graft was visualized by angiography and is normal in caliber.   SVG graft was visualized by angiography and is normal in caliber.   LIMA graft was visualized by angiography and is normal in caliber.   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 Patent sequential vein graft to the LAD and Diagonal Severe proximal Circumflex stenosis. Patent LIMA graft to the obtuse marginal branch Chronic total occlusion of the mid RCA. The PDA and posterolateral arteries fill from the sequential vein graft.  Elevated right heart pressures  RA 7 RV 47/7/9 PA 48/19 mean 35 PCWP 26    Recent Radiology Findings:  CTA (02/2023) FINDINGS: Aortic Valve: Functionally bicuspid with fused right and left cusp AV with calcium score 874   Aorta: No aneurysm normal arch vessels moderate calcific atherosclerosis   Sinotubular Junction: 26.4 mm   Ascending Thoracic Aorta: 34 mm   Aortic Arch: 25 mm   Descending Thoracic Aorta: 24 mm   Sinus of Valsalva Measurements:   Non-coronary: 31.4 mm  Height 23.1 mm   Right - coronary: 28.6 mm  Height 16.2 mm   Left - coronary: 30.8 mm  Height 18.4 mm   Coronary Artery Height above Annulus:   Left Main: 13.9 mm above annulus   Right Coronary:  9.3 mm above annulus   Virtual Basal Annulus Measurements:   Maximum/Minimum Diameter: 23.3 mm x 18.6 mm Average diameter 21.2 mm   Perimeter: 67.7 mm   Area: 353 mm2   Coronary Arteries: Sufficient height above annulus for deployment and patient has by pass grafts   Optimum Fluoroscopic Angle for Delivery: LAO 7 Caudal 6 degrees   IMPRESSION: 1. Functionally bicuspid AV with fused right and left cusp score 874   2. Annular area of 353 mm2 suitable for a 23  mm Sapien 3 valve Alternatively a 26 Medtronic Evolut valve would fit   3.  Optimum angiographic angle for deployment LAO 7 Caudal 6 degrees   4.  Coronary arteries sufficient height above annulus for deployment   5.  There are 2 patent SVGls and a patent LIMA   6.  Membranous septal length 9.5 mm         Recent Lab Findings: Recent Labs       Lab Results  Component Value Date    WBC 5.2 06/02/2023    HGB 12.9 06/09/2023    HCT 38.0 06/09/2023    PLT 241 06/02/2023    GLUCOSE 81 06/02/2023    CHOL 132 02/02/2023    TRIG 77.0 02/02/2023    HDL 55.40 02/02/2023    LDLCALC 61 02/02/2023    ALT 18 06/02/2023    AST 30 06/02/2023    NA 141 06/09/2023    K 3.5 06/09/2023    CL 104 06/02/2023    CREATININE 0.71 06/02/2023    BUN 12 06/02/2023    CO2 19 (L) 06/02/2023    TSH 2.471 06/02/2023    INR 1.0 ratio 10/26/2010    HGBA1C 5.9 02/02/2023            Assessment / Plan:    82 yo female with NYHA class 2 symptoms refferable to her AS with normal LV function and patent grafts after CABG. She appears to have indications for AVR and with her age and comorbidities would best be done with TAVR and she is not a bailout candidate. Will need to finalize size and type of valve at valve conference secondary to her being on the cusp of sapien sizes.

## 2023-06-21 ENCOUNTER — Encounter (HOSPITAL_COMMUNITY): Payer: Self-pay | Admitting: Cardiovascular Disease

## 2023-06-21 ENCOUNTER — Inpatient Hospital Stay (HOSPITAL_COMMUNITY): Payer: Medicare Other | Admitting: Physician Assistant

## 2023-06-21 ENCOUNTER — Other Ambulatory Visit: Payer: Self-pay

## 2023-06-21 ENCOUNTER — Inpatient Hospital Stay (HOSPITAL_COMMUNITY)
Admission: RE | Admit: 2023-06-21 | Discharge: 2023-06-27 | DRG: 267 | Disposition: A | Discharge status: Skilled Nursing Facility | Payer: Medicare Other | Attending: Cardiovascular Disease | Admitting: Cardiovascular Disease

## 2023-06-21 ENCOUNTER — Inpatient Hospital Stay (HOSPITAL_COMMUNITY): Payer: Medicare Other | Admitting: Certified Registered Nurse Anesthetist

## 2023-06-21 ENCOUNTER — Encounter (HOSPITAL_COMMUNITY)
Admission: RE | Disposition: A | Discharge status: Skilled Nursing Facility | Payer: Self-pay | Source: Home / Self Care | Attending: Cardiovascular Disease

## 2023-06-21 ENCOUNTER — Ambulatory Visit (HOSPITAL_COMMUNITY): Payer: Medicare Other

## 2023-06-21 ENCOUNTER — Other Ambulatory Visit: Payer: Self-pay | Admitting: Physician Assistant

## 2023-06-21 DIAGNOSIS — J4520 Mild intermittent asthma, uncomplicated: Secondary | ICD-10-CM

## 2023-06-21 DIAGNOSIS — F419 Anxiety disorder, unspecified: Secondary | ICD-10-CM | POA: Diagnosis present

## 2023-06-21 DIAGNOSIS — E871 Hypo-osmolality and hyponatremia: Secondary | ICD-10-CM | POA: Diagnosis not present

## 2023-06-21 DIAGNOSIS — E039 Hypothyroidism, unspecified: Secondary | ICD-10-CM | POA: Diagnosis present

## 2023-06-21 DIAGNOSIS — I472 Ventricular tachycardia, unspecified: Secondary | ICD-10-CM | POA: Diagnosis present

## 2023-06-21 DIAGNOSIS — Z79890 Hormone replacement therapy (postmenopausal): Secondary | ICD-10-CM

## 2023-06-21 DIAGNOSIS — I743 Embolism and thrombosis of arteries of the lower extremities: Secondary | ICD-10-CM | POA: Diagnosis not present

## 2023-06-21 DIAGNOSIS — I2510 Atherosclerotic heart disease of native coronary artery without angina pectoris: Secondary | ICD-10-CM

## 2023-06-21 DIAGNOSIS — I4729 Other ventricular tachycardia: Secondary | ICD-10-CM | POA: Diagnosis not present

## 2023-06-21 DIAGNOSIS — I70201 Unspecified atherosclerosis of native arteries of extremities, right leg: Secondary | ICD-10-CM | POA: Diagnosis present

## 2023-06-21 DIAGNOSIS — I872 Venous insufficiency (chronic) (peripheral): Secondary | ICD-10-CM | POA: Diagnosis present

## 2023-06-21 DIAGNOSIS — I110 Hypertensive heart disease with heart failure: Secondary | ICD-10-CM | POA: Diagnosis present

## 2023-06-21 DIAGNOSIS — Z954 Presence of other heart-valve replacement: Secondary | ICD-10-CM | POA: Diagnosis not present

## 2023-06-21 DIAGNOSIS — Z952 Presence of prosthetic heart valve: Secondary | ICD-10-CM | POA: Diagnosis not present

## 2023-06-21 DIAGNOSIS — I350 Nonrheumatic aortic (valve) stenosis: Secondary | ICD-10-CM | POA: Diagnosis not present

## 2023-06-21 DIAGNOSIS — I10 Essential (primary) hypertension: Secondary | ICD-10-CM | POA: Diagnosis present

## 2023-06-21 DIAGNOSIS — Z7982 Long term (current) use of aspirin: Secondary | ICD-10-CM

## 2023-06-21 DIAGNOSIS — K219 Gastro-esophageal reflux disease without esophagitis: Secondary | ICD-10-CM | POA: Diagnosis present

## 2023-06-21 DIAGNOSIS — R202 Paresthesia of skin: Secondary | ICD-10-CM | POA: Diagnosis not present

## 2023-06-21 DIAGNOSIS — F418 Other specified anxiety disorders: Secondary | ICD-10-CM | POA: Diagnosis not present

## 2023-06-21 DIAGNOSIS — Z79899 Other long term (current) drug therapy: Secondary | ICD-10-CM

## 2023-06-21 DIAGNOSIS — E785 Hyperlipidemia, unspecified: Secondary | ICD-10-CM | POA: Diagnosis present

## 2023-06-21 DIAGNOSIS — Z9011 Acquired absence of right breast and nipple: Secondary | ICD-10-CM

## 2023-06-21 DIAGNOSIS — Z951 Presence of aortocoronary bypass graft: Secondary | ICD-10-CM | POA: Diagnosis not present

## 2023-06-21 DIAGNOSIS — Z825 Family history of asthma and other chronic lower respiratory diseases: Secondary | ICD-10-CM

## 2023-06-21 DIAGNOSIS — T40605A Adverse effect of unspecified narcotics, initial encounter: Secondary | ICD-10-CM | POA: Diagnosis present

## 2023-06-21 DIAGNOSIS — R32 Unspecified urinary incontinence: Secondary | ICD-10-CM | POA: Diagnosis present

## 2023-06-21 DIAGNOSIS — Z006 Encounter for examination for normal comparison and control in clinical research program: Secondary | ICD-10-CM

## 2023-06-21 DIAGNOSIS — Z853 Personal history of malignant neoplasm of breast: Secondary | ICD-10-CM

## 2023-06-21 DIAGNOSIS — Z8673 Personal history of transient ischemic attack (TIA), and cerebral infarction without residual deficits: Secondary | ICD-10-CM

## 2023-06-21 DIAGNOSIS — F32A Depression, unspecified: Secondary | ICD-10-CM | POA: Diagnosis present

## 2023-06-21 DIAGNOSIS — Z881 Allergy status to other antibiotic agents status: Secondary | ICD-10-CM

## 2023-06-21 DIAGNOSIS — K5900 Constipation, unspecified: Secondary | ICD-10-CM | POA: Diagnosis not present

## 2023-06-21 DIAGNOSIS — I471 Supraventricular tachycardia, unspecified: Secondary | ICD-10-CM | POA: Diagnosis not present

## 2023-06-21 DIAGNOSIS — I70221 Atherosclerosis of native arteries of extremities with rest pain, right leg: Secondary | ICD-10-CM | POA: Diagnosis not present

## 2023-06-21 DIAGNOSIS — K5903 Drug induced constipation: Secondary | ICD-10-CM | POA: Diagnosis present

## 2023-06-21 DIAGNOSIS — Z7984 Long term (current) use of oral hypoglycemic drugs: Secondary | ICD-10-CM

## 2023-06-21 DIAGNOSIS — Z86718 Personal history of other venous thrombosis and embolism: Secondary | ICD-10-CM

## 2023-06-21 DIAGNOSIS — I779 Disorder of arteries and arterioles, unspecified: Secondary | ICD-10-CM | POA: Diagnosis present

## 2023-06-21 DIAGNOSIS — Z880 Allergy status to penicillin: Secondary | ICD-10-CM

## 2023-06-21 DIAGNOSIS — Z791 Long term (current) use of non-steroidal anti-inflammatories (NSAID): Secondary | ICD-10-CM

## 2023-06-21 DIAGNOSIS — I5032 Chronic diastolic (congestive) heart failure: Secondary | ICD-10-CM | POA: Diagnosis present

## 2023-06-21 DIAGNOSIS — Z7401 Bed confinement status: Secondary | ICD-10-CM | POA: Diagnosis not present

## 2023-06-21 DIAGNOSIS — R531 Weakness: Secondary | ICD-10-CM | POA: Diagnosis not present

## 2023-06-21 DIAGNOSIS — I998 Other disorder of circulatory system: Secondary | ICD-10-CM | POA: Diagnosis not present

## 2023-06-21 HISTORY — PX: INTRAOPERATIVE TRANSTHORACIC ECHOCARDIOGRAM: SHX6523

## 2023-06-21 HISTORY — DX: Nonrheumatic aortic (valve) stenosis: I35.0

## 2023-06-21 HISTORY — DX: Atherosclerotic heart disease of native coronary artery without angina pectoris: I25.10

## 2023-06-21 HISTORY — PX: TRANSCATHETER AORTIC VALVE REPLACEMENT, TRANSFEMORAL: SHX6400

## 2023-06-21 HISTORY — DX: Presence of prosthetic heart valve: Z95.2

## 2023-06-21 LAB — ECHOCARDIOGRAM LIMITED
AR max vel: 1.82 cm2
AV Area VTI: 1.93 cm2
AV Area mean vel: 1.96 cm2
AV Mean grad: 4 mmHg
AV Peak grad: 7.8 mmHg
Ao pk vel: 1.4 m/s
Area-P 1/2: 2.97 cm2
MV VTI: 0.95 cm2

## 2023-06-21 LAB — POCT ACTIVATED CLOTTING TIME: Activated Clotting Time: 287 seconds

## 2023-06-21 SURGERY — IMPLANTATION, AORTIC VALVE, TRANSCATHETER, FEMORAL APPROACH
Anesthesia: Monitor Anesthesia Care

## 2023-06-21 MED ORDER — PROTAMINE SULFATE 10 MG/ML IV SOLN
INTRAVENOUS | Status: DC | PRN
  Administered 2023-06-21: 70 mg via INTRAVENOUS

## 2023-06-21 MED ORDER — NITROGLYCERIN IN D5W 200-5 MCG/ML-% IV SOLN: 0.0000 ug/min | INTRAVENOUS | Status: DC

## 2023-06-21 MED ORDER — CHLORHEXIDINE GLUCONATE 0.12 % MT SOLN
OROMUCOSAL | Status: AC
  Administered 2023-06-21: 15 mL via OROMUCOSAL
  Filled 2023-06-21: qty 15

## 2023-06-21 MED ORDER — PROPOFOL 500 MG/50ML IV EMUL
INTRAVENOUS | Status: DC | PRN
  Administered 2023-06-21: 20 ug/kg/min via INTRAVENOUS

## 2023-06-21 MED ORDER — PHENYLEPHRINE 80 MCG/ML (10ML) SYRINGE FOR IV PUSH (FOR BLOOD PRESSURE SUPPORT)
PREFILLED_SYRINGE | INTRAVENOUS | Status: DC | PRN
Start: 2023-06-21 — End: 2023-06-21
  Administered 2023-06-21: 160 ug via INTRAVENOUS

## 2023-06-21 MED ORDER — HEPARIN SODIUM (PORCINE) 1000 UNIT/ML IJ SOLN
INTRAMUSCULAR | Status: DC | PRN
  Administered 2023-06-21: 2000 [IU] via INTRAVENOUS
  Administered 2023-06-21: 9000 [IU] via INTRAVENOUS

## 2023-06-21 MED ORDER — LOSARTAN POTASSIUM 50 MG PO TABS
50.0000 mg | ORAL_TABLET | Freq: Every day | ORAL | Status: DC
  Administered 2023-06-21 – 2023-06-22 (×2): 50 mg via ORAL
  Filled 2023-06-21 (×2): qty 1

## 2023-06-21 MED ORDER — LACTATED RINGERS IV SOLN: INTRAVENOUS | Status: DC | PRN

## 2023-06-21 MED ORDER — SODIUM CHLORIDE 0.9 % IV SOLN
INTRAVENOUS | Status: AC
  Administered 2023-06-21: 50 mL/h via INTRAVENOUS

## 2023-06-21 MED ORDER — SODIUM CHLORIDE 0.9% FLUSH
3.0000 mL | Freq: Two times a day (BID) | INTRAVENOUS | Status: DC
  Administered 2023-06-21 – 2023-06-27 (×11): 3 mL via INTRAVENOUS

## 2023-06-21 MED ORDER — LIDOCAINE HCL (PF) 1 % IJ SOLN
INTRAMUSCULAR | Status: DC | PRN
  Administered 2023-06-21 (×2): 10 mL

## 2023-06-21 MED ORDER — ASPIRIN 81 MG PO TBEC
81.0000 mg | DELAYED_RELEASE_TABLET | Freq: Every day | ORAL | Status: DC
  Administered 2023-06-21 – 2023-06-27 (×7): 81 mg via ORAL
  Filled 2023-06-21 (×7): qty 1

## 2023-06-21 MED ORDER — SODIUM CHLORIDE 0.9 % IV SOLN: INTRAVENOUS | Status: DC

## 2023-06-21 MED ORDER — ONDANSETRON HCL 4 MG/2ML IJ SOLN: 4.0000 mg | Freq: Four times a day (QID) | INTRAMUSCULAR | Status: DC | PRN

## 2023-06-21 MED ORDER — FENTANYL CITRATE (PF) 100 MCG/2ML IJ SOLN
INTRAMUSCULAR | Status: DC | PRN
Start: 2023-06-21 — End: 2023-06-21
  Administered 2023-06-21: 25 ug via INTRAVENOUS
  Administered 2023-06-21: 50 ug via INTRAVENOUS
  Administered 2023-06-21: 25 ug via INTRAVENOUS

## 2023-06-21 MED ORDER — OXYCODONE HCL 5 MG PO TABS
5.0000 mg | ORAL_TABLET | ORAL | Status: DC | PRN
  Administered 2023-06-21 – 2023-06-23 (×5): 10 mg via ORAL
  Administered 2023-06-23: 5 mg via ORAL
  Filled 2023-06-21: qty 2
  Filled 2023-06-21: qty 1
  Filled 2023-06-21 (×6): qty 2

## 2023-06-21 MED ORDER — CHLORHEXIDINE GLUCONATE 0.12 % MT SOLN: 15.0000 mL | Freq: Once | OROMUCOSAL | Status: AC

## 2023-06-21 MED ORDER — SODIUM CHLORIDE 0.9% FLUSH: 3.0000 mL | INTRAVENOUS | Status: DC | PRN

## 2023-06-21 MED ORDER — FENTANYL CITRATE (PF) 100 MCG/2ML IJ SOLN
INTRAMUSCULAR | Status: AC
  Filled 2023-06-21: qty 2

## 2023-06-21 MED ORDER — DEXMEDETOMIDINE HCL IN NACL 400 MCG/100ML IV SOLN
INTRAVENOUS | Status: DC | PRN
  Administered 2023-06-21: 28.36 ug via INTRAVENOUS

## 2023-06-21 MED ORDER — HEPARIN (PORCINE) IN NACL 1000-0.9 UT/500ML-% IV SOLN
INTRAVENOUS | Status: DC | PRN
  Administered 2023-06-21 (×2): 500 mL

## 2023-06-21 MED ORDER — FAMOTIDINE 20 MG PO TABS
40.0000 mg | ORAL_TABLET | Freq: Every day | ORAL | Status: DC
  Administered 2023-06-21 – 2023-06-27 (×7): 40 mg via ORAL
  Filled 2023-06-21 (×7): qty 2

## 2023-06-21 MED ORDER — FENTANYL CITRATE (PF) 100 MCG/2ML IJ SOLN: 25.0000 ug | INTRAMUSCULAR | Status: DC | PRN

## 2023-06-21 MED ORDER — CITALOPRAM HYDROBROMIDE 20 MG PO TABS
20.0000 mg | ORAL_TABLET | Freq: Every day | ORAL | Status: DC
  Administered 2023-06-21 – 2023-06-27 (×7): 20 mg via ORAL
  Filled 2023-06-21 (×7): qty 1

## 2023-06-21 MED ORDER — CHLORHEXIDINE GLUCONATE 4 % EX SOLN: 60.0000 mL | Freq: Once | CUTANEOUS | Status: DC

## 2023-06-21 MED ORDER — SODIUM CHLORIDE 0.9 % IV SOLN: 250.0000 mL | INTRAVENOUS | Status: DC | PRN

## 2023-06-21 MED ORDER — ONDANSETRON HCL 4 MG/2ML IJ SOLN
INTRAMUSCULAR | Status: DC | PRN
Start: 2023-06-21 — End: 2023-06-21
  Administered 2023-06-21: 4 mg via INTRAVENOUS

## 2023-06-21 MED ORDER — HYDRALAZINE HCL 25 MG PO TABS: 25.0000 mg | ORAL_TABLET | Freq: Four times a day (QID) | ORAL | Status: DC | PRN

## 2023-06-21 MED ORDER — ZOLPIDEM TARTRATE 5 MG PO TABS
5.0000 mg | ORAL_TABLET | Freq: Every evening | ORAL | Status: DC | PRN
  Administered 2023-06-21 – 2023-06-26 (×6): 5 mg via ORAL
  Filled 2023-06-21 (×6): qty 1

## 2023-06-21 MED ORDER — ATORVASTATIN CALCIUM 10 MG PO TABS
20.0000 mg | ORAL_TABLET | Freq: Every day | ORAL | Status: DC
  Administered 2023-06-21 – 2023-06-27 (×7): 20 mg via ORAL
  Filled 2023-06-21 (×7): qty 2

## 2023-06-21 MED ORDER — IODIXANOL 320 MG/ML IV SOLN
INTRAVENOUS | Status: DC | PRN
  Administered 2023-06-21: 55 mL

## 2023-06-21 MED ORDER — CEFAZOLIN SODIUM-DEXTROSE 2-4 GM/100ML-% IV SOLN
2.0000 g | Freq: Three times a day (TID) | INTRAVENOUS | Status: AC
  Administered 2023-06-21 – 2023-06-22 (×2): 2 g via INTRAVENOUS
  Filled 2023-06-21 (×2): qty 100

## 2023-06-21 MED ORDER — ACETAMINOPHEN 325 MG PO TABS
ORAL_TABLET | ORAL | Status: AC
  Filled 2023-06-21: qty 2

## 2023-06-21 MED ORDER — CHLORHEXIDINE GLUCONATE 4 % EX SOLN: 30.0000 mL | CUTANEOUS | Status: DC

## 2023-06-21 MED ORDER — EMPAGLIFLOZIN 10 MG PO TABS
10.0000 mg | ORAL_TABLET | Freq: Every day | ORAL | Status: DC
  Administered 2023-06-22 – 2023-06-23 (×2): 10 mg via ORAL
  Filled 2023-06-21 (×6): qty 1

## 2023-06-21 MED ORDER — LACTATED RINGERS IV SOLN: INTRAVENOUS | Status: DC

## 2023-06-21 MED ORDER — LEVOTHYROXINE SODIUM 75 MCG PO TABS
75.0000 ug | ORAL_TABLET | Freq: Every day | ORAL | Status: DC
  Administered 2023-06-22 – 2023-06-27 (×6): 75 ug via ORAL
  Filled 2023-06-21 (×6): qty 1

## 2023-06-21 MED ORDER — ACETAMINOPHEN 325 MG PO TABS
650.0000 mg | ORAL_TABLET | Freq: Four times a day (QID) | ORAL | Status: DC | PRN
  Administered 2023-06-24 – 2023-06-26 (×3): 650 mg via ORAL
  Filled 2023-06-21 (×3): qty 2

## 2023-06-21 MED ORDER — LORAZEPAM 1 MG PO TABS
1.0000 mg | ORAL_TABLET | Freq: Four times a day (QID) | ORAL | Status: DC | PRN
  Administered 2023-06-21: 1 mg via ORAL
  Filled 2023-06-21 (×2): qty 1

## 2023-06-21 MED ORDER — FENTANYL CITRATE (PF) 100 MCG/2ML IJ SOLN
INTRAMUSCULAR | Status: AC
  Administered 2023-06-21: 25 ug
  Filled 2023-06-21: qty 2

## 2023-06-21 MED ORDER — ACETAMINOPHEN 650 MG RE SUPP: 650.0000 mg | Freq: Four times a day (QID) | RECTAL | Status: DC | PRN

## 2023-06-21 SURGICAL SUPPLY — 32 items
BAG SNAP BAND KOVER 36X36 (MISCELLANEOUS) ×4 IMPLANT
CABLE ADAPT PACING TEMP 12FT (ADAPTER) IMPLANT
CATH 20 ULTRA DELIVERY (CATHETERS) IMPLANT
CATH DIAG 6FR PIGTAIL ANGLED (CATHETERS) IMPLANT
CATH INFINITI 5FR ANG PIGTAIL (CATHETERS) IMPLANT
CATH INFINITI 6F AL1 (CATHETERS) IMPLANT
CATH S G BIP PACING (CATHETERS) IMPLANT
CATH-GARD ARROW CATH SHIELD (MISCELLANEOUS) ×1
CLOSURE MYNX CONTROL 6F/7F (Vascular Products) IMPLANT
CLOSURE PERCLOSE PROSTYLE (VASCULAR PRODUCTS) IMPLANT
CRIMPER (MISCELLANEOUS) IMPLANT
DEVICE INFLATION ATRION QL2530 (MISCELLANEOUS) IMPLANT
KIT MICROPUNCTURE NIT STIFF (SHEATH) IMPLANT
KIT SAPIAN 3 ULTRA RESILIA 20 (Valve) IMPLANT
PACK CARDIAC CATHETERIZATION (CUSTOM PROCEDURE TRAY) ×2 IMPLANT
SET ATX-X65L (MISCELLANEOUS) IMPLANT
SHEATH BRITE TIP 7FR 35CM (SHEATH) IMPLANT
SHEATH INTRODUCER SET 20-26 (SHEATH) IMPLANT
SHEATH PINNACLE 6F 10CM (SHEATH) IMPLANT
SHEATH PINNACLE 8F 10CM (SHEATH) IMPLANT
SHEATH PROBE COVER 6X72 (BAG) IMPLANT
SHIELD CATHGARD ARROW (MISCELLANEOUS) IMPLANT
STOPCOCK MORSE 400PSI 3WAY (MISCELLANEOUS) ×4 IMPLANT
TRANSDUCER W/STOPCOCK (MISCELLANEOUS) ×4 IMPLANT
TUBING ART PRESS 72 MALE/FEM (TUBING) IMPLANT
WIRE AMPLATZ SS-J .035X180CM (WIRE) IMPLANT
WIRE EMERALD 3MM-J .035X150CM (WIRE) IMPLANT
WIRE EMERALD 3MM-J .035X260CM (WIRE) IMPLANT
WIRE EMERALD ST .035X260CM (WIRE) IMPLANT
WIRE G VAS 035X260 STIFF (WIRE) IMPLANT
WIRE MICROINTRODUCER 60CM (WIRE) IMPLANT
WIRE SAFARI SM CURVE 275 (WIRE) IMPLANT

## 2023-06-21 NOTE — Progress Notes (Signed)
  [facility]   MULTIDISCIPLINARY HEART VALVE TEAM  Patient doing well s/p TAVR. She is hemodynamically stable but hypertensive. Groin sites stable. ECG with sinus brady but no high grade block. Arterial line discontinued and transferred to 4E. Patient given a percocet for right leg cramping which is chronic for her. Wants something for anxiety bc she cannot relax. Will give hydralazine for elevated BPs.   Plan for early ambulation after bedrest completed and hopeful discharge over the next 24-48 hours.   Cline Crock PA-C  [facility]  Pager [phone]

## 2023-06-21 NOTE — Anesthesia Postprocedure Evaluation (Signed)
Anesthesia Post Note  Patient: [name]  Procedure(s) Performed: Transcatheter Aortic Valve Replacement, Transfemoral INTRAOPERATIVE TRANSTHORACIC ECHOCARDIOGRAM     Patient location during evaluation: Cath Lab Anesthesia Type: MAC Level of consciousness: awake Pain management: pain level controlled Vital Signs Assessment: post-procedure vital signs reviewed and stable Respiratory status: spontaneous breathing, nonlabored ventilation and respiratory function stable Cardiovascular status: blood pressure returned to baseline and stable Postop Assessment: no apparent nausea or vomiting Anesthetic complications: no   There were no known notable events for this encounter.  Last Vitals:  Vitals:   06/21/23 1600 06/21/23 1700  BP: (!) 183/64 (!) 158/62  Pulse: 65 (!) 56  Resp: 20 14  Temp: 36.4 C 36.4 C  SpO2: 100% 97%    Last Pain:  Vitals:   06/21/23 1700  TempSrc: Axillary  PainSc:                  Catheryn Bacon 

## 2023-06-21 NOTE — Discharge Summary (Addendum)
[facility]   MULTIDISCIPLINARY HEART VALVE TEAM  Discharge Summary    Patient ID: [name] MRN: [id number]; DOB: January 10, 1941  Admit date: 06/21/2023 Discharge date: 06/27/2023  Primary Care Provider: Pincus Sanes, MD  Primary Cardiologist: Tonny Bollman, MD   Discharge Diagnoses    Principal Problem:   S/P TAVR (transcatheter aortic valve replacement) Active Problems:   Personal history of malignant neoplasm of breast   Severe aortic stenosis   GERD (gastroesophageal reflux disease)   Hypothyroidism   Hyperlipidemia   Carotid artery disease (HCC)   Chronic venous insufficiency   Essential hypertension   Chronic diastolic heart failure (HCC)   History of CVA (cerebrovascular accident)   CAD (coronary artery disease)   Constipation   Allergies Allergies  Allergen Reactions   Clarithromycin Other (See Comments)    PATIENT UNSURE OF REACTION   Clindamycin Other (See Comments)   Codeine     REACTION: nausea   Levofloxacin     REACTION: nausea   Morphine Other (See Comments)    Nausea  Nausea    Naproxen     REACTION: nausea   Omeprazole Other (See Comments)    Made her jittery, made her feel weird, ?rash   Penicillins Other (See Comments) and Rash    REACTION: RASH REACTION: RASH    Pneumococcal Vaccines Rash    Small rash on arm at injection site.     Diagnostic Studies/Procedures    [facility]  TAVR OPERATIVE NOTE     Date of Procedure:                06/21/2023   Preoperative Diagnosis:      Severe Aortic Stenosis    Postoperative Diagnosis:    Same    Procedure:        Transcatheter Aortic Valve Replacement - Transfemoral Approach             Edwards Sapien 3 THV (size 20 mm, model # E786707 , serial # [id number] )              Co-Surgeons:                        Verne Carrow, MD and Eugenio Hoes , MD    Anesthesiologist:                  Bradley Ferris   Echocardiographer:              Croitoru    Pre-operative Echo Findings: Severe aortic stenosis Normal left ventricular systolic function   Post-operative Echo Findings: Trivial paravalvular leak Normal left ventricular systolic function   _____________   Echo 06/22/23:  IMPRESSIONS   1. Left ventricular ejection fraction, by estimation, is 65 to 70%. The  left ventricle has normal function. The left ventricle has no regional  wall motion abnormalities. Left ventricular diastolic parameters are  consistent with Grade II diastolic  dysfunction (pseudonormalization). Elevated left atrial pressure.   2. There appears to be a prominent muscular ridge (extension of the  moderator band) in the mid right ventricle, separating the inflow chanber  from the infundibulum and causing flow acceleration with a "gradient" of  approximately 1.6 m/s. This was  present on the pre-procedural images performed before TAVR deployment. The  accelerated flow mimics the flow of a membranoud VSD, but the velocity is  low and there is no evidence of increased flow across the RVOT. There  is  no evidence of VSD. Right  ventricular systolic function is hyperdynamic. The right ventricular size  is normal. Tricuspid regurgitation signal is inadequate for assessing PA  pressure.   3. Left atrial size was moderately dilated.   4. The mitral valve is normal in structure. Moderate mitral valve  regurgitation. Moderate mitral annular calcification.   5. The aortic valve has been repaired/replaced. Aortic valve  regurgitation is not visualized. No aortic stenosis is present. There is a  20 mm Edwards Ultra, stented (TAVR) valve present in the aortic position.  Procedure Date: 06/21/2023. Echo findings are   consistent with normal structure and function of the aortic valve  prosthesis. Aortic valve mean gradient measures 7.0 mmHg. Aortic valve  Vmax measures 1.66 m/s. Aortic valve acceleration time measures 108 msec.    ____________________   VAS [place] LE  06/22/23 Summary:  Right: Total occlusion noted in the common femoral artery. Arterial study  of the right leg shows and occluded CFA and String flow in the EIA. There  is a collateral pathway connecting the limited ECA flow to the Profunda  Artery, resulting in monophasic  flow throughout the right leg      ____________________   06/22/2023 Pre-operative Diagnosis: Ischemic right leg Post-operative diagnosis:  Same Surgeon:  Durene Cal Assistants:  Antony Blackbird, PA Procedure:   #1: Right femoral endarterectomy with bovine pericardial patch angioplasty                       #2: Right femoral thrombectomy Anesthesia:  general  ____________________  Abdominal XR 06/26/23 IMPRESSION: Moderate colonic stool burden. No bowel obstruction.  History of Present Illness     [name] is a 82 y.o. female with a history of CAD s/p CABG x5 2007, HTN, HLD, hypothyroidism, chronic diastolic HF, breast cancer s/p mastectomy, and aortic stenosis who presented to [facility] on 06/21/23 for planned TAVR.   Pt with a history of CABG and has been followed by Dr [name]. She has been noted to have progressive AS with last echo with an EF of 60% and mean aortic gradient of and with a valve area of 0.7cm2. She complained of tiredness that has been progressive and DOE. She was seen in the ER on 7/18 for worsening fatigue and concern for stroke. [facility] 06/09/23 showed severe triple vessel CAD s/p 5V CABG with 5/5 patent bypass grafts.   The patient was evaluated by the multidisciplinary valve team and felt to have severe, symptomatic aortic stenosis and to be a suitable candidate for TAVR, which was set up for 06/21/23.  Hospital Course     Consultants: hospital medicine   Severe AS: s/p successful TAVR with a 20 mm Edwards Sapien 3 Ultra Resilia THV via the TF approach on 06/21/23. Post operative echo showed EF 65%, normally functioning TAVR with a mean gradient of 7 mmHg and no PVL. Groin sites are stable. ECG  with sinus and no high grade heart block. Continued on home Asprin.    Ischemic right leg: s/p femoral thrombectomy/endarterectomy with bovine pericardial patch angioplasty. Appreciate vascular. Leg incision looks good and leg is warm and well perfused            Hyponatremia: NA 133. Will continue to monitor.    Intermittent SVT: noted intermittently on tele. Resumed on home beta blocker. This was asymptomatic    CAD s/p CABG: pre TAVR cath showed triple vessel CAD s/p 5V CABG with 5/5 patent bypass  grafts. Continue medical therapy.    HTN: BP has been more elevated. Will resume all her home antihypertensives and follow as an outpatient.    Chronic diastolic CHF: appears euvolemic. Resume Lasix    Constipation: she finally had a BM. Likely related to water restriction, narcotics and laying in bed. Resume home regimen at discharge.    Dispo: discharge to [facility] today.   _____________  Discharge Vitals Blood pressure (!) 172/79, pulse 78, temperature 98.2 F (36.8 C), temperature source Oral, resp. rate 15, height 5\' 2"  (1.575 m), weight 61.2 kg, SpO2 97%.  Filed Weights   06/23/23 0702 06/24/23 0315 06/25/23 0600  Weight: 58.7 kg 55.7 kg 61.2 kg    GEN: Well nourished, well developed, in no acute distress.  HEENT: Grossly normal.  Neck: Supple, no JVD, carotid bruits, or masses. Cardiac: RRR, 3/6 sysolic murmur. No rubs, or gallops. No clubbing, cyanosis, edema.   Respiratory:  Respirations regular and unlabored, clear to auscultation bilaterally. GI: Soft, nontender, nondistended, BS + x 4. MS: no deformity or atrophy. Skin: warm and dry, no rash.  Groin sites clear without hematoma or ecchymosis. Right leg warm with good pulses Neuro:  Strength and sensation are intact. Psych: AAOx3.  Normal affect.   Labs & Radiologic Studies    CBC Recent Labs    06/25/23 0444 06/26/23 0411  WBC 9.2 8.4  HGB 11.0* 10.9*  HCT 33.2* 33.0*  MCV 88.5 91.2  PLT 156 168   Basic  Metabolic Panel Recent Labs    16/10/96 0444 06/26/23 0411 06/26/23 1015  NA 130* 133*  --   K 3.9 3.7  --   CL 96* 98  --   CO2 26 26  --   GLUCOSE 106* 88  --   BUN 14 12  --   CREATININE 0.68 0.64  --   CALCIUM 8.2* 8.4*  --   MG  --   --  2.0   Liver Function Tests No results for input(s): "AST", "ALT", "ALKPHOS", "BILITOT", "PROT", "ALBUMIN" in the last 72 hours. No results for input(s): "LIPASE", "AMYLASE" in the last 72 hours. Cardiac Enzymes No results for input(s): "CKTOTAL", "CKMB", "CKMBINDEX", "TROPONINI" in the last 72 hours. BNP Invalid input(s): "POCBNP" D-Dimer No results for input(s): "DDIMER" in the last 72 hours. Hemoglobin A1C No results for input(s): "HGBA1C" in the last 72 hours. Fasting Lipid Panel No results for input(s): "CHOL", "HDL", "LDLCALC", "TRIG", "CHOLHDL", "LDLDIRECT" in the last 72 hours. Thyroid Function Tests No results for input(s): "TSH", "T4TOTAL", "T3FREE", "THYROIDAB" in the last 72 hours.  Invalid input(s): "FREET3" _____________  Cathleen Corti 2 Views  Result Date: 06/26/2023 CLINICAL DATA:  Constipation. EXAM: ABDOMEN - 2 VIEW COMPARISON:  None Available. FINDINGS: Moderate stool throughout the colon. No bowel dilatation or evidence of obstruction. No free air. There is degenerative changes of the spine and scoliosis. No acute osseous pathology. Median sternotomy wires and aortic valve repair. IMPRESSION: Moderate colonic stool burden. No bowel obstruction. Electronically Signed   By: Elgie Collard M.D.   On: 06/26/2023 15:16   DG Chest 2 View  Result Date: 06/23/2023 CLINICAL DATA:  Preop EXAM: CHEST - 2 VIEW COMPARISON:  Chest x-ray 06/02/2023 FINDINGS: Sternotomy wires, mediastinal clips and axillary clips are again seen. Cardiomediastinal silhouette is within normal limits. There is no focal lung infiltrate, pleural effusion or pneumothorax. No acute fractures are seen. IMPRESSION: No active cardiopulmonary disease. Electronically  Signed   By: Darliss Cheney M.D.   On:  06/23/2023 19:20   VAS [place] LOWER EXTREMITY ARTERIAL DUPLEX  Result Date: 06/22/2023 LOWER EXTREMITY ARTERIAL DUPLEX STUDY Patient Name:  [name]  Date of Exam:   06/22/2023 Medical Rec #: [id number]      Accession #:    [id number] Date of Birth: Jul 24, 1941       Patient Gender: F Patient Age:   71 years Exam Location:  [facility] Procedure:      VAS [place] LOWER EXTREMITY ARTERIAL DUPLEX Referring Phys: Samara Deist Benton Tooker --------------------------------------------------------------------------------  Indications: Peripheral artery disease, and Numbness and tingling. High Risk Factors: Hypertension, hyperlipidemia, coronary artery disease.  Vascular Interventions: Transcatheter Aortic Valve Replacement 06/21/23. Current ABI:            N/A Comparison Study: No prior study Performing Technologist: Shona Simpson  Examination Guidelines: A complete evaluation includes B-mode imaging, spectral Doppler, color Doppler, and power Doppler as needed of all accessible portions of each vessel. Bilateral testing is considered an integral part of a complete examination. Limited examinations for reoccurring indications may be performed as noted.  +-----------+--------+-----+--------+----------+--------+ RIGHT      PSV cm/sRatioStenosisWaveform  Comments +-----------+--------+-----+--------+----------+--------+ EIA Distal 50                   monophasic         +-----------+--------+-----+--------+----------+--------+ CFA Distal 0            occluded                   +-----------+--------+-----+--------+----------+--------+ DFA        28                   monophasic         +-----------+--------+-----+--------+----------+--------+ SFA Prox   20                   monophasic         +-----------+--------+-----+--------+----------+--------+ SFA Mid    13                   monophasic         +-----------+--------+-----+--------+----------+--------+  SFA Distal 11                   monophasic         +-----------+--------+-----+--------+----------+--------+ POP Prox   11                   monophasic         +-----------+--------+-----+--------+----------+--------+ POP Distal 12                   monophasic         +-----------+--------+-----+--------+----------+--------+ ATA Distal 3                    monophasic         +-----------+--------+-----+--------+----------+--------+ PTA Distal 5                    monophasic         +-----------+--------+-----+--------+----------+--------+ PERO Distal4                    monophasic         +-----------+--------+-----+--------+----------+--------+  +----------+--------+-----+--------+---------+--------+ LEFT      PSV cm/sRatioStenosisWaveform Comments +----------+--------+-----+--------+---------+--------+ CFA [id number]                  triphasic         +----------+--------+-----+--------+---------+--------+  Summary: Right:  Total occlusion noted in the common femoral artery. Arterial study of the right leg shows and occluded CFA and String flow in the EIA. There is a collateral pathway connecting the limited ECA flow to the Profunda Artery, resulting in monophasic flow throughout the right leg.  See table(s) above for measurements and observations. Electronically signed by Coral Else MD on 06/22/2023 at 5:46:43 PM.    Final    PERIPHERAL VASCULAR CATHETERIZATION  Result Date: 06/22/2023 See surgical note for result.  ECHOCARDIOGRAM COMPLETE  Result Date: 06/22/2023    ECHOCARDIOGRAM REPORT   Patient Name:   [name] Date of Exam: 06/22/2023 Medical Rec #:  [id number]     Height:       62.0 in Accession #:    [id number]    Weight:       129.6 lb Date of Birth:  [birth year]      BSA:          1.590 m Patient Age:    82 years      BP:           102/52 mmHg Patient Gender: F             HR:           80 bpm. Exam Location:  Inpatient Procedure: 2D Echo,  Color Doppler and Cardiac Doppler Indications:    Post TAVR z95.2  History:        Patient has prior history of Echocardiogram examinations, most                 recent 06/21/2023. CAD; Risk Factors:Hypertension and                 Dyslipidemia. 20mm Edwards S3U TAVR implanted 06/21/23.                  Aortic Valve: 20 mm Edwards Ultra, stented (TAVR) valve is                 present in the aortic position. Procedure Date: 06/21/2023.  Sonographer:    Irving Burton Senior RDCS Referring Phys: [id number] Janetta Hora  Sonographer Comments: Very difficult windows due to thin body habitus and implants. IMPRESSIONS  1. Left ventricular ejection fraction, by estimation, is 65 to 70%. The left ventricle has normal function. The left ventricle has no regional wall motion abnormalities. Left ventricular diastolic parameters are consistent with Grade II diastolic dysfunction (pseudonormalization). Elevated left atrial pressure.  2. There appears to be a prominent muscular ridge (extension of the moderator band) in the mid right ventricle, separating the inflow chanber from the infundibulum and causing flow acceleration with a "gradient" of approximately 1.6 m/s. This was present on the pre-procedural images performed before TAVR deployment. The accelerated flow mimics the flow of a membranoud VSD, but the velocity is low and there is no evidence of increased flow across the RVOT. There is no evidence of VSD. Right ventricular systolic function is hyperdynamic. The right ventricular size is normal. Tricuspid regurgitation signal is inadequate for assessing PA pressure.  3. Left atrial size was moderately dilated.  4. The mitral valve is normal in structure. Moderate mitral valve regurgitation. Moderate mitral annular calcification.  5. The aortic valve has been repaired/replaced. Aortic valve regurgitation is not visualized. No aortic stenosis is present. There is a 20 mm Edwards Ultra, stented (TAVR) valve present in the aortic  position. Procedure Date: 06/21/2023. Echo findings are  consistent with normal structure and function of  the aortic valve prosthesis. Aortic valve mean gradient measures 7.0 mmHg. Aortic valve Vmax measures 1.66 m/s. Aortic valve acceleration time measures 108 msec. FINDINGS  Left Ventricle: Left ventricular ejection fraction, by estimation, is 65 to 70%. The left ventricle has normal function. The left ventricle has no regional wall motion abnormalities. The left ventricular internal cavity size was normal in size. There is  borderline concentric left ventricular hypertrophy. Left ventricular diastolic parameters are consistent with Grade II diastolic dysfunction (pseudonormalization). Elevated left atrial pressure. Right Ventricle: There appears to be a prominent muscular ridge (extension of the moderator band) in the mid right ventricle, separating the inflow chanber from the infundibulum and causing flow acceleration with a "gradient" of approximately 1.6 m/s. This was present on the pre-procedural images performed before TAVR deployment. The accelerated flow mimics the flow of a membranoud VSD, but the velocity is low and there is no evidence of increased flow across the RVOT. There is no evidence of VSD. The  right ventricular size is normal. No increase in right ventricular wall thickness. Right ventricular systolic function is hyperdynamic. Tricuspid regurgitation signal is inadequate for assessing PA pressure. Left Atrium: Left atrial size was moderately dilated. Right Atrium: Right atrial size was normal in size. Pericardium: There is no evidence of pericardial effusion. Mitral Valve: The mitral valve is normal in structure. There is mild thickening of the mitral valve leaflet(s). There is mild calcification of the mitral valve leaflet(s). Moderate mitral annular calcification. Moderate mitral valve regurgitation. MV peak gradient, 6.6 mmHg. The mean mitral valve gradient is 3.0 mmHg. Tricuspid Valve: The  tricuspid valve is normal in structure. Tricuspid valve regurgitation is not demonstrated. Aortic Valve: The aortic valve has been repaired/replaced. Aortic valve regurgitation is not visualized. No aortic stenosis is present. Aortic valve mean gradient measures 7.0 mmHg. Aortic valve peak gradient measures 11.0 mmHg. Aortic valve area, by VTI  measures 2.17 cm. There is a 20 mm Edwards Ultra, stented (TAVR) valve present in the aortic position. Procedure Date: 06/21/2023. Echo findings are consistent with normal structure and function of the aortic valve prosthesis. Pulmonic Valve: The pulmonic valve was grossly normal. Pulmonic valve regurgitation is mild to moderate. Aorta: The aortic root is normal in size and structure. IAS/Shunts: No atrial level shunt detected by color flow Doppler. No ventricular septal defect is seen or detected.  LEFT VENTRICLE PLAX 2D LVIDd:         2.90 cm   Diastology LVIDs:         2.00 cm   LV e' medial:    5.22 cm/s LV PW:         1.00 cm   LV E/e' medial:  24.3 LV IVS:        1.20 cm   LV e' lateral:   6.20 cm/s LVOT diam:     1.90 cm   LV E/e' lateral: 20.5 LV SV:         65 LV SV Index:   41 LVOT Area:     2.84 cm  RIGHT VENTRICLE RV S prime:     10.20 cm/s TAPSE (M-mode): 2.1 cm LEFT ATRIUM           Index        RIGHT ATRIUM           Index LA diam:      3.30 cm 2.08 cm/m   RA Area:     19.40 cm LA Vol (A4C): 54.4 ml 34.22 ml/m  RA  Volume:   54.90 ml  34.53 ml/m  AORTIC VALVE AV Area (Vmax):    1.95 cm AV Area (Vmean):   1.90 cm AV Area (VTI):     2.17 cm AV Vmax:           166.00 cm/s AV Vmean:          133.000 cm/s AV VTI:            0.299 m AV Peak Grad:      11.0 mmHg AV Mean Grad:      7.0 mmHg LVOT Vmax:         114.00 cm/s LVOT Vmean:        89.100 cm/s LVOT VTI:          0.229 m LVOT/AV VTI ratio: 0.77  AORTA Ao Root diam: 3.20 cm Ao Asc diam:  3.30 cm MITRAL VALVE MV Area (PHT): 1.93 cm     SHUNTS MV Area VTI:   1.75 cm     Systemic VTI:  0.23 m MV Peak grad:   6.6 mmHg     Systemic Diam: 1.90 cm MV Mean grad:  3.0 mmHg MV Vmax:       1.28 m/s MV Vmean:      86.1 cm/s MV Decel Time: 393 msec MV E velocity: 127.00 cm/s MV A velocity: 96.40 cm/s MV E/A ratio:  1.32 Mihai Croitoru MD Electronically signed by Thurmon Fair MD Signature Date/Time: 06/22/2023/12:38:52 PM    Final    ECHOCARDIOGRAM LIMITED  Result Date: 06/21/2023    ECHOCARDIOGRAM LIMITED REPORT   Patient Name:   [name] Date of Exam: 06/21/2023 Medical Rec #:  [id number]     Height:       62.0 in Accession #:    [id number]    Weight:       125.0 lb Date of Birth:  1941-04-09      BSA:          1.566 m Patient Age:    82 years      BP:           158/60 mmHg Patient Gender: F             HR:           69 bpm. Exam Location:  Inpatient Procedure: Limited Echo, Limited Color Doppler and Cardiac Doppler Indications:     aortic stenosis  History:         Patient has prior history of Echocardiogram examinations, most                  recent 02/23/2023. CAD, history of breast cancer; Risk                  Factors:Hypertension and Dyslipidemia.                  Aortic Valve: 20 mm Edwards Ultra, stented (TAVR) valve is                  present in the aortic position.  Sonographer:     Delcie Roch RDCS Referring Phys:  [id number] Wille Celeste Anneka Studer Diagnosing Phys: Thurmon Fair MD  Sonographer Comments: Image acquisition challenging due to breast implants. PREOPERATIVE EVALUATION Normal left ventricular systolic function, LVEF 65%. Trileaflet aortic valve with severe calcific stenosis. Peak gradient 30 mm Hg, mean gradient 17 mm Hg, dimensionless index 0.26, calculated AV area 0.49cm sq (0.31cm sq/ m sq indexed for BSA). Gradients are  likely underestimated due to difficult Doppler beam alignment. Mild aortic insufficiency. Mild mitral insufficiency. No pericardial effusion. POSTOPERATIVE EVALUATION Hyperdynamic left ventricular systolic function, LVEF 70%. Well seated TAVR stent-valve. Peak gradient 8mm Hg, mean  gradient 4 mm Hg, dimensionless index 0.71, calculated AV area 2.11 cm sq (1.34 cm sq/ m sq indexed for BSA), acelleration time 60 ms. Trivial perivalvular aortic insufficiency (in the area of the left coronary ostium, "3 o'clock"). Mild mitral insufficiency. No pericardial effusion.  IMPRESSIONS  1. Left ventricular diastolic parameters are consistent with Grade II diastolic dysfunction (pseudonormalization). Elevated left atrial pressure.  2. Right ventricular systolic function is normal. The right ventricular size is normal.  3. Left atrial size was moderately dilated.  4. The mitral valve is degenerative. Moderate mitral annular calcification.  5. The aortic valve has been repaired/replaced. There is a 20 mm Edwards Ultra, stented (TAVR) valve present in the aortic position. Aortic valve mean gradient measures 4.0 mmHg. Aortic valve Vmax measures 1.40 m/s. Aortic valve acceleration time measures 60 msec. FINDINGS  Left Ventricle: Left ventricular diastolic parameters are consistent with Grade II diastolic dysfunction (pseudonormalization). Elevated left atrial pressure. Right Ventricle: The right ventricular size is normal. Right ventricular systolic function is normal. Left Atrium: Left atrial size was moderately dilated. Pericardium: There is no evidence of pericardial effusion. Mitral Valve: The mitral valve is degenerative in appearance. Moderate mitral annular calcification. MV peak gradient, 10.6 mmHg. The mean mitral valve gradient is 3.0 mmHg. Tricuspid Valve: The tricuspid valve is not well visualized. Aortic Valve: The aortic valve has been repaired/replaced. Aortic valve mean gradient measures 4.0 mmHg. Aortic valve peak gradient measures 7.8 mmHg. Aortic valve area, by VTI measures 1.93 cm. There is a 20 mm Edwards Ultra, stented (TAVR) valve present in the aortic position. Pulmonic Valve: The pulmonic valve was not well visualized. Aorta: The aortic root is normal in size and structure. Additional  Comments: Spectral Doppler performed. Color Doppler performed.  LEFT VENTRICLE PLAX 2D LVOT diam:     1.90 cm LV SV:         62 LV SV Index:   40 LVOT Area:     2.84 cm  AORTIC VALVE AV Area (Vmax):    1.82 cm AV Area (Vmean):   1.96 cm AV Area (VTI):     1.93 cm AV Vmax:           140.00 cm/s AV Vmean:          95.400 cm/s AV VTI:            0.324 m AV Peak Grad:      7.8 mmHg AV Mean Grad:      4.0 mmHg LVOT Vmax:         89.80 cm/s LVOT Vmean:        65.950 cm/s LVOT VTI:          0.220 m LVOT/AV VTI ratio: 0.68  AORTA Ao Root diam: 2.50 cm MITRAL VALVE MV Area (PHT): 2.97 cm   SHUNTS MV Area VTI:   0.95 cm   Systemic VTI:  0.22 m MV Peak grad:  10.6 mmHg  Systemic Diam: 1.90 cm MV Mean grad:  3.0 mmHg MV Vmax:       1.63 m/s MV Vmean:      79.8 cm/s Rachelle Hora Croitoru MD Electronically signed by Thurmon Fair MD Signature Date/Time: 06/21/2023/6:25:15 PM    Final (Updated)    Structural Heart Procedure  Result Date: 06/21/2023 See surgical note  for result.  CARDIAC CATHETERIZATION  Result Date: 06/10/2023   Mid RCA lesion is 100% stenosed.   Prox Cx lesion is 99% stenosed.   Ost LAD to Prox LAD lesion is 90% stenosed.   SVG graft was visualized by angiography and is normal in caliber.   SVG graft was visualized by angiography and is normal in caliber.   LIMA graft was visualized by angiography and is normal in caliber. 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 Patent sequential vein graft to the LAD and Diagonal Severe proximal Circumflex stenosis. Patent LIMA graft to the obtuse marginal branch Chronic total occlusion of the mid RCA. The PDA and posterolateral arteries fill from the sequential vein graft. Elevated right heart pressures RA 7 RV 47/7/9 PA 48/19 mean 35 PCWP 26 Recommendations: Continue workup for TAVR. Medical management of CAD   DG Chest 2 View  Result Date: 06/02/2023 CLINICAL DATA:  Fatigue, exertional shortness of breath, no  energy. Concern for symptomatic aortic stenosis. EXAM: CHEST - 2 VIEW COMPARISON:  CT 03/08/2023 and previous FINDINGS: Linear pleural based opacity in the lingula increased since prior study. Right lung clear. No pneumothorax. Heart size and mediastinal contours are within normal limits. CABG marker. Aortic Atherosclerosis (ICD10-170.0). No effusion. Sternotomy wires.  Surgical clips left axilla. IMPRESSION: Increasing lingular opacity, possibly atelectasis. Electronically Signed   By: Corlis Leak M.D.   On: 06/02/2023 10:56   Disposition   Pt is being discharged to a SNF today in good condition.  Follow-up Plans & Appointments     Contact information for follow-up providers     Filbert Schilder, NP. Go on 07/04/2023.   Specialty: Cardiology Why: @ 8:40am, please arrive at least 10 minutes early. Contact information: [address] [place] [address] [phone]         [facility] at [facility] Follow up in 3 week(s).   Specialty: Vascular Surgery Contact information: [address] [place] [address] [phone]             Contact information for after-discharge care     Destination     [facility] Preferred SNF .   Service: Skilled Nursing Contact information: [address] [place] [address] [phone]                      Discharge Medications   Allergies as of 06/27/2023       Reactions   Clarithromycin Other (See Comments)   PATIENT UNSURE OF REACTION   Clindamycin Other (See Comments)   Codeine    REACTION: nausea   Levofloxacin    REACTION: nausea   Morphine Other (See Comments)   Nausea Nausea   Naproxen    REACTION: nausea   Omeprazole Other (See Comments)   Made her jittery, made her feel weird, ?rash   Penicillins Other (See Comments), Rash   REACTION: RASH REACTION: RASH   Pneumococcal Vaccines Rash   Small rash on  arm at injection site.         Medication List     TAKE these medications    aspirin 81 MG tablet Take 81 mg by mouth daily.   atorvastatin 20 MG tablet Commonly known as: LIPITOR TAKE 1 TABLET BY MOUTH EVERY DAY   CALTRATE 600 PO Take 1 tablet by mouth 2 (two) times daily.   citalopram 20 MG tablet Commonly  known as: CELEXA TAKE 1 TABLET BY MOUTH EVERY DAY   co-enzyme Q-10 30 MG capsule Take 30 mg by mouth daily.   empagliflozin 10 MG Tabs tablet Commonly known as: Jardiance Take 1 tablet (10 mg total) by mouth daily before breakfast.   famotidine 40 MG tablet Commonly known as: PEPCID Take 1 tablet (40 mg total) by mouth daily.   fluticasone 50 MCG/ACT nasal spray Commonly known as: FLONASE Place 1 spray into both nostrils daily as needed for allergies or rhinitis.   furosemide 20 MG tablet Commonly known as: LASIX TAKE 1 TABLET BY MOUTH EVERY DAY   levothyroxine 75 MCG tablet Commonly known as: SYNTHROID TAKE 1 TABLET BY MOUTH EVERY MORNING BEFORE BREAKFAST   losartan 50 MG tablet Commonly known as: COZAAR TAKE 1 TABLET BY MOUTH EVERY DAY   meloxicam 7.5 MG tablet Commonly known as: MOBIC TAKE 1/2 TO 1 TABLET EVERY DAY   metoprolol tartrate 25 MG tablet Commonly known as: LOPRESSOR Take 1 tablet (25 mg total) by mouth 2 (two) times daily.   PreserVision AREDS 2 Caps Take 1 capsule by mouth 2 (two) times daily.   Multivitamin Liqd Take 1 tablet by mouth daily.   senna-docusate 8.6-50 MG tablet Commonly known as: Senokot-S Take 1 tablet by mouth daily.   VITAMIN D (CHOLECALCIFEROL) PO Take 1 tablet by mouth daily.   zolpidem 5 MG tablet Commonly known as: AMBIEN Take 1 tablet (5 mg total) by mouth at bedtime as needed. for sleep What changed:  reasons to take this additional instructions         Outstanding Labs/Studies   none  Duration of Discharge Encounter   Greater than 30 minutes including physician  time.  Signed, Cline Crock, PA-C 06/27/2023, 9:06 AM [phone]  I have personally seen and examined this patient. I agree with the assessment and plan as outlined above.  Pt doing well today. No complaints. Groins stable. Right leg warm and well perfused. Good distal pulses. BP stable. Sinus on tele. Will d/c to [facility] today.   Verne Carrow, MD, [facility] 06/27/2023 9:37 AM

## 2023-06-21 NOTE — Progress Notes (Signed)
  Echocardiogram 2D Echocardiogram has been performed.  [name] 06/21/2023, 2:05 PM

## 2023-06-21 NOTE — Anesthesia Preprocedure Evaluation (Addendum)
Anesthesia Evaluation  Patient identified by MRN, date of birth, ID band Patient awake    Reviewed: Allergy & Precautions, NPO status , Patient's Chart, lab work & pertinent test results  Airway Mallampati: III  TM Distance: >3 FB Neck ROM: Full    Dental no notable dental hx.    Pulmonary asthma    Pulmonary exam normal breath sounds clear to auscultation       Cardiovascular hypertension, Pt. on medications and Pt. on home beta blockers + CAD, + CABG and + DVT  + Valvular Problems/Murmurs AS  Rhythm:Regular Rate:Normal + Systolic murmurs ECHO: 1. Left ventricular ejection fraction, by estimation, is 60 to 65%. Left  ventricular ejection fraction by PLAX is 64 %. The left ventricle has  normal function. The left ventricle has no regional wall motion  abnormalities. There is mild left ventricular  hypertrophy. Left ventricular diastolic parameters are consistent with  Grade III diastolic dysfunction (restrictive). Elevated left ventricular  end-diastolic pressure.   2. Right ventricular systolic function is low normal. The right  ventricular size is normal. There is moderately elevated pulmonary artery  systolic pressure. The estimated right ventricular systolic pressure is  51.6 mmHg.   3. Left atrial size was moderately dilated.   4. The mitral valve is degenerative. Trivial mitral valve regurgitation.   5. The aortic valve is calcified. There is severe calcifcation of the  aortic valve. Aortic valve regurgitation is trivial. Moderate to severe  aortic valve stenosis. Aortic regurgitation PHT measures 572 msec. Aortic  valve area, by VTI measures 0.72 cm.   Aortic valve mean gradient measures 25.0 mmHg. Aortic valve Vmax measures  3.32 m/s. Peak gradient 44 mmHg, DI 0.32.   6. The pulmonic valve was abnormal.   7. Aortic dilatation noted. There is borderline dilatation of the  ascending aorta, measuring 39 mm.   8. The  inferior vena cava is normal in size with <50% respiratory  variability, suggesting right atrial pressure of 8 mmHg.     Neuro/Psych  PSYCHIATRIC DISORDERS Anxiety Depression    negative neurological ROS     GI/Hepatic Neg liver ROS,GERD  Medicated and Controlled,,  Endo/Other  Hypothyroidism    Renal/GU negative Renal ROS     Musculoskeletal  (+) Arthritis ,    Abdominal   Peds  Hematology negative hematology ROS (+)   Anesthesia Other Findings Severe Aortic Stenosis  Reproductive/Obstetrics                             Anesthesia Physical Anesthesia Plan  ASA: 4  Anesthesia Plan: MAC   Post-op Pain Management:    Induction: Intravenous  PONV Risk Score and Plan: 2 and Ondansetron, Dexamethasone, Propofol infusion and Treatment may vary due to age or medical condition  Airway Management Planned: Simple Face Mask  Additional Equipment:   Intra-op Plan:   Post-operative Plan:   Informed Consent: I have reviewed the patients History and Physical, chart, labs and discussed the procedure including the risks, benefits and alternatives for the proposed anesthesia with the patient or authorized representative who has indicated his/her understanding and acceptance.     Dental advisory given  Plan Discussed with: CRNA  Anesthesia Plan Comments:        Anesthesia Quick Evaluation

## 2023-06-21 NOTE — Transfer of Care (Signed)
Immediate Anesthesia Transfer of Care Note  Patient: [name]  Procedure(s) Performed: Transcatheter Aortic Valve Replacement, Transfemoral INTRAOPERATIVE TRANSTHORACIC ECHOCARDIOGRAM  Patient Location: Cath Lab  Anesthesia Type:MAC  Level of Consciousness: drowsy and patient cooperative  Airway & Oxygen Therapy: Patient Spontanous Breathing and Patient connected to nasal cannula oxygen  Post-op Assessment: Report given to RN, Post -op Vital signs reviewed and stable, and Patient moving all extremities X 4  Post vital signs: Reviewed and stable  Last Vitals:  Vitals Value Taken Time  BP 119/53 06/21/23 1400  Temp    Pulse 54 06/21/23 1404  Resp 13 06/21/23 1404  SpO2 93 % 06/21/23 1404  Vitals shown include unfiled device data.  Last Pain:  Vitals:   06/21/23 1008  TempSrc:   PainSc: 0-No pain         Complications: There were no known notable events for this encounter.

## 2023-06-21 NOTE — CV Procedure (Signed)
[facility]  TAVR OPERATIVE NOTE   Date of Procedure:  06/21/2023  Preoperative Diagnosis: Severe Aortic Stenosis   Postoperative Diagnosis: Same   Procedure:   Transcatheter Aortic Valve Replacement - Transfemoral Approach  Edwards Sapien 3 THV (size 20 mm, model # [id number] , serial # [id number] )   Co-Surgeons:  Verne Carrow, MD and Eugenio Hoes , MD   Anesthesiologist:  Ellender  Echocardiographer:  Croitoru  Pre-operative Echo Findings: Severe aortic stenosis Normal left ventricular systolic function  Post-operative Echo Findings: Trivial paravalvular leak Normal left ventricular systolic function  BRIEF CLINICAL NOTE AND INDICATIONS FOR SURGERY  82 y.o. female with history of CAD and aortic stenosis here today for TAVR. She underwent multivessel CABG in 2007.  Cardiac catheterization in 2011 demonstrated patency of all of her bypass grafts.  She has developed progressive aortic stenosis with fatigue and weakness felt to be due to her AS.   During the course of the patient's preoperative work up they have been evaluated comprehensively by a multidisciplinary team of specialists coordinated through the Multidisciplinary Heart Valve Clinic in the [facility].  They have been demonstrated to suffer from symptomatic severe aortic stenosis as noted above. The patient has been counseled extensively as to the relative risks and benefits of all options for the treatment of severe aortic stenosis including long term medical therapy, conventional surgery for aortic valve replacement, and transcatheter aortic valve replacement.  The patient has been independently evaluated by Dr. Leafy Ro with CT surgery and they are felt to be at high risk for conventional surgical aortic valve replacement. The surgeon indicated the patient would be a poor candidate for conventional surgery. Based upon review of all of the patient's preoperative diagnostic  tests they are felt to be candidate for transcatheter aortic valve replacement using the transfemoral approach as an alternative to high risk conventional surgery.    Following the decision to proceed with transcatheter aortic valve replacement, a discussion has been held regarding what types of management strategies would be attempted intraoperatively in the event of life-threatening complications, including whether or not the patient would be considered a candidate for the use of cardiopulmonary bypass and/or conversion to open sternotomy for attempted surgical intervention.  The patient has been advised of a variety of complications that might develop peculiar to this approach including but not limited to risks of death, stroke, paravalvular leak, aortic dissection or other major vascular complications, aortic annulus rupture, device embolization, cardiac rupture or perforation, acute myocardial infarction, arrhythmia, heart block or bradycardia requiring permanent pacemaker placement, congestive heart failure, respiratory failure, renal failure, pneumonia, infection, other late complications related to structural valve deterioration or migration, or other complications that might ultimately cause a temporary or permanent loss of functional independence or other long term morbidity.  The patient provides full informed consent for the procedure as described and all questions were answered preoperatively.    DETAILS OF THE OPERATIVE PROCEDURE  PREPARATION:   The patient is brought to the operating room on the above mentioned date and central monitoring was established by the anesthesia team including placement of a radial arterial line. The patient is placed in the supine position on the operating table.  Intravenous antibiotics are administered. Conscious sedation is used.   Baseline transthoracic echocardiogram was performed. The patient's chest, abdomen, both groins, and both lower extremities are  prepared and draped in a sterile manner. A time out procedure is performed.   PERIPHERAL ACCESS:  Using the modified Seldinger technique, femoral arterial and venous access were obtained with placement of a 6 Fr sheath in the artery and a 7 Fr sheath in the vein on the left side using u/s guidance.  A pigtail diagnostic catheter was passed through the femoral arterial sheath under fluoroscopic guidance into the aortic root.  A temporary transvenous pacemaker catheter was passed through the femoral venous sheath under fluoroscopic guidance into the right ventricle.  The pacemaker was tested to ensure stable lead placement and pacemaker capture. Aortic root angiography was performed in order to determine the optimal angiographic angle for valve deployment.  TRANSFEMORAL ACCESS:  A micropuncture kit was used to gain access to the right femoral artery using u/s guidance. Position confirmed with angiography. Pre-closure with double ProGlide closure devices. The patient was heparinized systemically and ACT verified > 250 seconds.    A 14 Fr transfemoral E-sheath was introduced into the right femoral artery after progressively dilating over an Amplatz superstiff wire. An AL-1 catheter was used to direct a long J wire across the native aortic valve into the left ventricle. This was exchanged out for a pigtail catheter and position was confirmed in the LV apex. Simultaneous LV and Ao pressures were recorded.  The pigtail catheter was then exchanged for a Safari wire in the LV apex.   TRANSCATHETER HEART VALVE DEPLOYMENT:  An Edwards Sapien 3 THV (size 20 mm) was prepared and crimped per manufacturer's guidelines with 2 cc added to system, and the proper orientation of the valve is confirmed on the [employer] delivery system. The valve was advanced through the introducer sheath using normal technique until in an appropriate position in the abdominal aorta beyond the sheath tip. The balloon was then  retracted and using the fine-tuning wheel was centered on the valve. The valve was then advanced across the aortic arch using appropriate flexion of the catheter. The valve was carefully positioned across the aortic valve annulus. The Commander catheter was retracted using normal technique. Once final position of the valve has been confirmed by angiographic assessment, the valve is deployed while temporarily holding ventilation and during rapid ventricular pacing to maintain systolic blood pressure < 50 mmHg and pulse pressure < 10 mmHg. The balloon inflation is held for >3 seconds after reaching full deployment volume. Once the balloon has fully deflated the balloon is retracted into the ascending aorta and valve function is assessed using TTE. There is felt to be trivial paravalvular leak and no central aortic insufficiency.  The patient's hemodynamic recovery following valve deployment is good.  The deployment balloon and guidewire are both removed. Echo demostrated acceptable post-procedural gradients, stable mitral valve function, and trivial AI.   PROCEDURE COMPLETION:  The sheath was then removed and closure devices were completed. Protamine was administered once femoral arterial repair was complete. The temporary pacemaker, pigtail catheters and femoral sheaths were removed with a Mynx closure device placed in the artery and manual pressure used for venous hemostasis.    The patient tolerated the procedure well and is transported to the surgical intensive care in stable condition. There were no immediate intraoperative complications. All sponge instrument and needle counts are verified correct at completion of the operation.   No blood products were administered during the operation.  The patient received a total of 55 mL of intravenous contrast during the procedure.  LVEDP: 8 mm Hg  Verne Carrow MD, [facility] 06/21/2023 1:57 PM

## 2023-06-21 NOTE — Progress Notes (Signed)
Pt admitted from cath lab. Bilateral groin level 0, CCMD called A/ox4       06/21/23 1554  Vitals  Temp 97.6 F (36.4 C)  Temp Source Axillary  BP (!) 176/66  MAP (mmHg) 95  BP Location Right Arm  BP Method Automatic  Patient Position (if appropriate) Lying  Pulse Rate 61  Pulse Rate Source Monitor  ECG Heart Rate 61  Resp 20  Level of Consciousness  Level of Consciousness Alert  MEWS COLOR  MEWS Score Color Green  Oxygen Therapy  SpO2 98 %  O2 Device Room Air  O2 Flow Rate (L/min) 0 L/min  Pain Assessment  Pain Scale 0-10  Pain Score 0  MEWS Score  MEWS Temp 0  MEWS Systolic 0  MEWS Pulse 0  MEWS RR 0  MEWS LOC 0  MEWS Score 0

## 2023-06-21 NOTE — Interval H&P Note (Signed)
History and Physical Interval Note:  06/21/2023 9:56 AM  [name]  has presented today for surgery, with the diagnosis of Severe Aortic Stenosis.  The various methods of treatment have been discussed with the patient and family. After consideration of risks, benefits and other options for treatment, the patient has consented to  Procedure(s): Transcatheter Aortic Valve Replacement, Transfemoral (N/A) INTRAOPERATIVE TRANSTHORACIC ECHOCARDIOGRAM (N/A) as a surgical intervention.  The patient's history has been reviewed, patient examined, no change in status, stable for surgery.  I have reviewed the patient's chart and labs.  Questions were answered to the patient's satisfaction.     Verne Carrow

## 2023-06-21 NOTE — Progress Notes (Signed)
Pt arrived from ...cath.., A/ox 4...pt denies any pain, MD aware,CCMD called. CHG bath given,no further needs at this time   

## 2023-06-21 NOTE — Op Note (Signed)
[facility]   MULTIDISCIPLINARY HEART VALVE TEAM   TAVR OPERATIVE NOTE   Date of Procedure:  06/21/2023  Preoperative Diagnosis: Severe Aortic Stenosis   Postoperative Diagnosis: Same   Procedure:   Transcatheter Aortic Valve Replacement - Percutaneous right Transfemoral Approach  Edwards Sapien 3 Ultra THV (size 20 mm (plus 3 cc), model # 9755RSL)   Co-Surgeons:  Eugenio Hoes MD and Verne Carrow   Anesthesiologist:  Dr Karna Christmas  Echocardiographer:  Dr Royann Shivers  Pre-operative Echo Findings: Severe aortic stenosis normal left ventricular systolic function  Post-operative Echo Findings: trivial paravalvular leak normal left ventricular systolic function   BRIEF CLINICAL NOTE AND INDICATIONS FOR SURGERY  82 yo female with NYHA class 2 symptoms refferable to her AS with normal LV function and patent grafts after CABG. She appears to have indications for AVR and with her age and comorbidities would best be done with TAVR and she is not a bailout candidate.     DETAILS OF THE OPERATIVE PROCEDURE  PREPARATION:    The patient was brought to the operating room on the above mentioned date and appropriate monitoring was established by the anesthesia team. The patient was placed in the supine position on the operating table.  Intravenous antibiotics were administered. The patient was monitored closely throughout the procedure under conscious sedation.  Baseline ransthoracic echocardiogram was performed. The patient's abdomen and both groins were prepped and draped in a sterile manner. A time out procedure was performed.   PERIPHERAL ACCESS:    Using the modified Seldinger technique, femoral arterial and venous access was obtained with placement of 6 Fr sheaths on the left side.  A pigtail diagnostic catheter was passed through the left arterial sheath under fluoroscopic guidance into the aortic root.  A temporary transvenous pacemaker catheter was  passed through the left femoral venous sheath under fluoroscopic guidance into the right ventricle.  The pacemaker was tested to ensure stable lead placement and pacemaker capture. Aortic root angiography was performed in order to determine the optimal angiographic angle for valve deployment.   TRANSFEMORAL ACCESS:   Percutaneous transfemoral access and sheath placement was performed using ultrasound guidance.  The right common femoral artery was cannulated using a micropuncture needle and appropriate location was verified using hand injection angiogram.  A pair of Abbott Perclose percutaneous closure devices were placed and a 6 French sheath replaced into the femoral artery.  The patient was heparinized systemically and ACT verified > 250 seconds.    A 14 Fr transfemoral E-sheath was introduced into the right common femoral artery after progressively dilating over an Amplatz superstiff wire. An AL1 catheter was used to direct a J-tip exchange length wire across the native aortic valve into the left ventricle. This was exchanged out for a pigtail catheter and position was confirmed in the LV apex. Simultaneous LV and Ao pressures were recorded LVEDP was  The pigtail catheter was exchanged for a Safari wire in the LV apex.   BALLOON AORTIC VALVULOPLASTY:   Was not performed    TRANSCATHETER HEART VALVE DEPLOYMENT:   An Edwards Sapien 3 Ultra transcatheter heart valve (size 20 mm plus 2 cc) was prepared and crimped per manufacturer's guidelines, and the proper orientation of the valve is confirmed on the [employer] delivery system. The valve was advanced through the introducer sheath using normal technique until in an appropriate position in the abdominal aorta beyond the sheath tip. The balloon was then retracted and using the fine-tuning wheel was  centered on the valve. The valve was then advanced across the aortic arch using appropriate flexion of the catheter. The valve was carefully  positioned across the aortic valve annulus. The Commander catheter was retracted using normal technique. Once final position of the valve has been confirmed by angiographic assessment, the valve is deployed during rapid ventricular pacing to maintain systolic blood pressure < 50 mmHg and pulse pressure < 10 mmHg. The balloon inflation is held for >3 seconds after reaching full deployment volume. Once the balloon has fully deflated the balloon is retracted into the ascending aorta and valve function is assessed using echocardiography. There is felt to be triavial paravalvular leak and no central aortic insufficiency.  The patient's hemodynamic recovery following valve deployment is good.  The deployment balloon and guidewire are both removed.    PROCEDURE COMPLETION:   The sheath was removed and femoral artery closure performed.  Protamine was administered once femoral arterial repair was complete. The temporary pacemaker, pigtail catheter and femoral sheaths were removed with manual pressure used for venous hemostasis.  A Mynx femoral closure device was utilized following removal of the diagnostic sheath in the left femoral artery.  The patient tolerated the procedure well and is transported to the cath lab recovery area in stable condition. There were no immediate intraoperative complications. All sponge instrument and needle counts are verified correct at completion of the operation.   No blood products were administered during the operation.  The patient received a total of 25 mL of intravenous contrast during the procedure.   Eugenio Hoes, MD 06/21/2023 1:54 PM

## 2023-06-21 NOTE — Discharge Instructions (Signed)

## 2023-06-22 ENCOUNTER — Encounter (HOSPITAL_COMMUNITY): Payer: Self-pay | Admitting: Cardiovascular Disease

## 2023-06-22 ENCOUNTER — Other Ambulatory Visit: Payer: Self-pay

## 2023-06-22 ENCOUNTER — Inpatient Hospital Stay (HOSPITAL_COMMUNITY): Payer: Medicare Other

## 2023-06-22 ENCOUNTER — Inpatient Hospital Stay (HOSPITAL_COMMUNITY): Payer: Medicare Other | Admitting: Anesthesiology

## 2023-06-22 ENCOUNTER — Encounter (HOSPITAL_COMMUNITY)
Admission: RE | Disposition: A | Discharge status: Skilled Nursing Facility | Payer: Self-pay | Source: Home / Self Care | Attending: Cardiovascular Disease

## 2023-06-22 DIAGNOSIS — Z006 Encounter for examination for normal comparison and control in clinical research program: Secondary | ICD-10-CM | POA: Diagnosis not present

## 2023-06-22 DIAGNOSIS — I2510 Atherosclerotic heart disease of native coronary artery without angina pectoris: Secondary | ICD-10-CM

## 2023-06-22 DIAGNOSIS — R202 Paresthesia of skin: Secondary | ICD-10-CM | POA: Diagnosis not present

## 2023-06-22 DIAGNOSIS — I70221 Atherosclerosis of native arteries of extremities with rest pain, right leg: Secondary | ICD-10-CM | POA: Diagnosis not present

## 2023-06-22 DIAGNOSIS — I743 Embolism and thrombosis of arteries of the lower extremities: Secondary | ICD-10-CM

## 2023-06-22 DIAGNOSIS — I110 Hypertensive heart disease with heart failure: Secondary | ICD-10-CM

## 2023-06-22 DIAGNOSIS — Z952 Presence of prosthetic heart valve: Secondary | ICD-10-CM

## 2023-06-22 DIAGNOSIS — I5032 Chronic diastolic (congestive) heart failure: Secondary | ICD-10-CM

## 2023-06-22 DIAGNOSIS — Z954 Presence of other heart-valve replacement: Secondary | ICD-10-CM

## 2023-06-22 DIAGNOSIS — E871 Hypo-osmolality and hyponatremia: Secondary | ICD-10-CM | POA: Diagnosis not present

## 2023-06-22 DIAGNOSIS — I350 Nonrheumatic aortic (valve) stenosis: Secondary | ICD-10-CM | POA: Diagnosis not present

## 2023-06-22 HISTORY — PX: ENDARTERECTOMY FEMORAL: SHX5804

## 2023-06-22 HISTORY — PX: FEMORAL-POPLITEAL BYPASS GRAFT: SHX937

## 2023-06-22 HISTORY — PX: PATCH ANGIOPLASTY: SHX6230

## 2023-06-22 LAB — POCT I-STAT 7, (LYTES, BLD GAS, ICA,H+H)
Acid-Base Excess: 1 mmol/L (ref 0.0–2.0)
Bicarbonate: 26.9 mmol/L (ref 20.0–28.0)
Calcium, Ion: 1.23 mmol/L (ref 1.15–1.40)
HCT: 32 % — ABNORMAL LOW (ref 36.0–46.0)
Hemoglobin: 10.9 g/dL — ABNORMAL LOW (ref 12.0–15.0)
O2 Saturation: 100 %
Potassium: 3.5 mmol/L (ref 3.5–5.1)
Sodium: 133 mmol/L — ABNORMAL LOW (ref 135–145)
TCO2: 28 mmol/L (ref 22–32)
pCO2 arterial: 47.6 mmHg (ref 32–48)
pH, Arterial: 7.36 (ref 7.35–7.45)
pO2, Arterial: 438 mmHg — ABNORMAL HIGH (ref 83–108)

## 2023-06-22 LAB — LACTIC ACID, PLASMA: Lactic Acid, Venous: 1 mmol/L (ref 0.5–1.9)

## 2023-06-22 SURGERY — BYPASS GRAFT FEMORAL-POPLITEAL ARTERY
Anesthesia: General | Site: Groin | Laterality: Right

## 2023-06-22 MED ORDER — SUCCINYLCHOLINE CHLORIDE 200 MG/10ML IV SOSY
PREFILLED_SYRINGE | INTRAVENOUS | Status: DC | PRN
  Administered 2023-06-22: 120 mg via INTRAVENOUS

## 2023-06-22 MED ORDER — FENTANYL CITRATE (PF) 100 MCG/2ML IJ SOLN: 25.0000 ug | INTRAMUSCULAR | Status: DC | PRN

## 2023-06-22 MED ORDER — PROPOFOL 500 MG/50ML IV EMUL
INTRAVENOUS | Status: DC | PRN
  Administered 2023-06-22: 125 ug/kg/min via INTRAVENOUS

## 2023-06-22 MED ORDER — DEXAMETHASONE SODIUM PHOSPHATE 10 MG/ML IJ SOLN
INTRAMUSCULAR | Status: AC
  Filled 2023-06-22: qty 1

## 2023-06-22 MED ORDER — LACTATED RINGERS IV SOLN: INTRAVENOUS | Status: DC

## 2023-06-22 MED ORDER — SUCCINYLCHOLINE CHLORIDE 200 MG/10ML IV SOSY
PREFILLED_SYRINGE | INTRAVENOUS | Status: AC
  Filled 2023-06-22: qty 10

## 2023-06-22 MED ORDER — PROPOFOL 10 MG/ML IV BOLUS
INTRAVENOUS | Status: DC | PRN
Start: 2023-06-22 — End: 2023-06-22
  Administered 2023-06-22: 120 mg via INTRAVENOUS

## 2023-06-22 MED ORDER — ONDANSETRON HCL 4 MG/2ML IJ SOLN
INTRAMUSCULAR | Status: DC | PRN
Start: 2023-06-22 — End: 2023-06-22
  Administered 2023-06-22: 4 mg via INTRAVENOUS

## 2023-06-22 MED ORDER — ORAL CARE MOUTH RINSE: 15.0000 mL | Freq: Once | OROMUCOSAL | Status: AC

## 2023-06-22 MED ORDER — DIPHENHYDRAMINE HCL 50 MG/ML IJ SOLN
INTRAMUSCULAR | Status: AC
  Filled 2023-06-22: qty 1

## 2023-06-22 MED ORDER — AMISULPRIDE (ANTIEMETIC) 5 MG/2ML IV SOLN: 10.0000 mg | Freq: Once | INTRAVENOUS | Status: DC | PRN

## 2023-06-22 MED ORDER — HEPARIN SODIUM (PORCINE) 1000 UNIT/ML IJ SOLN
INTRAMUSCULAR | Status: DC | PRN
Start: 2023-06-22 — End: 2023-06-22
  Administered 2023-06-22: 6000 [IU] via INTRAVENOUS

## 2023-06-22 MED ORDER — DIPHENHYDRAMINE HCL 50 MG/ML IJ SOLN
INTRAMUSCULAR | Status: DC | PRN
Start: 2023-06-22 — End: 2023-06-22
  Administered 2023-06-22: 12.5 mg via INTRAVENOUS

## 2023-06-22 MED ORDER — PROTAMINE SULFATE 10 MG/ML IV SOLN
INTRAVENOUS | Status: DC | PRN
Start: 2023-06-22 — End: 2023-06-22
  Administered 2023-06-22: 40 mg via INTRAVENOUS
  Administered 2023-06-22: 10 mg via INTRAVENOUS

## 2023-06-22 MED ORDER — ONDANSETRON HCL 4 MG/2ML IJ SOLN
INTRAMUSCULAR | Status: AC
  Filled 2023-06-22: qty 2

## 2023-06-22 MED ORDER — HEPARIN 6000 UNIT IRRIGATION SOLUTION
Status: DC | PRN
  Administered 2023-06-22: 1

## 2023-06-22 MED ORDER — LIDOCAINE 2% (20 MG/ML) 5 ML SYRINGE
INTRAMUSCULAR | Status: AC
  Filled 2023-06-22: qty 5

## 2023-06-22 MED ORDER — LIDOCAINE 2% (20 MG/ML) 5 ML SYRINGE
INTRAMUSCULAR | Status: DC | PRN
  Administered 2023-06-22: 60 mg via INTRAVENOUS

## 2023-06-22 MED ORDER — FENTANYL CITRATE (PF) 250 MCG/5ML IJ SOLN
INTRAMUSCULAR | Status: DC | PRN
  Administered 2023-06-22 (×2): 50 ug via INTRAVENOUS

## 2023-06-22 MED ORDER — ONDANSETRON HCL 4 MG/2ML IJ SOLN: 4.0000 mg | Freq: Once | INTRAMUSCULAR | Status: DC | PRN

## 2023-06-22 MED ORDER — HEPARIN 6000 UNIT IRRIGATION SOLUTION
Status: AC
  Filled 2023-06-22: qty 500

## 2023-06-22 MED ORDER — PHENYLEPHRINE 80 MCG/ML (10ML) SYRINGE FOR IV PUSH (FOR BLOOD PRESSURE SUPPORT)
PREFILLED_SYRINGE | INTRAVENOUS | Status: AC
  Filled 2023-06-22: qty 10

## 2023-06-22 MED ORDER — 0.9 % SODIUM CHLORIDE (POUR BTL) OPTIME
TOPICAL | Status: DC | PRN
  Administered 2023-06-22: 2000 mL

## 2023-06-22 MED ORDER — ACETAMINOPHEN 10 MG/ML IV SOLN
INTRAVENOUS | Status: DC | PRN
  Administered 2023-06-22: 1000 mg via INTRAVENOUS

## 2023-06-22 MED ORDER — SUGAMMADEX SODIUM 200 MG/2ML IV SOLN
INTRAVENOUS | Status: DC | PRN
  Administered 2023-06-22: 117.6 mg via INTRAVENOUS

## 2023-06-22 MED ORDER — CHLORHEXIDINE GLUCONATE 0.12 % MT SOLN: 15.0000 mL | Freq: Once | OROMUCOSAL | Status: AC

## 2023-06-22 MED ORDER — ACETAMINOPHEN 10 MG/ML IV SOLN
INTRAVENOUS | Status: AC
  Filled 2023-06-22: qty 100

## 2023-06-22 MED ORDER — SURGIFLO WITH THROMBIN (HEMOSTATIC MATRIX KIT) OPTIME
TOPICAL | Status: DC | PRN
  Administered 2023-06-22: 1 via TOPICAL

## 2023-06-22 MED ORDER — PHENYLEPHRINE HCL-NACL 20-0.9 MG/250ML-% IV SOLN
INTRAVENOUS | Status: DC | PRN
  Administered 2023-06-22: 40 ug via INTRAVENOUS
  Administered 2023-06-22: 80 ug via INTRAVENOUS
  Administered 2023-06-22: 40 ug via INTRAVENOUS
  Administered 2023-06-22: 50 ug/min via INTRAVENOUS
  Administered 2023-06-22: 40 ug via INTRAVENOUS
  Administered 2023-06-22: 160 ug via INTRAVENOUS
  Administered 2023-06-22: 40 ug via INTRAVENOUS

## 2023-06-22 MED ORDER — OXYCODONE HCL 5 MG/5ML PO SOLN: 5.0000 mg | Freq: Once | ORAL | Status: DC | PRN

## 2023-06-22 MED ORDER — CHLORHEXIDINE GLUCONATE 0.12 % MT SOLN
OROMUCOSAL | Status: AC
  Administered 2023-06-22: 15 mL via OROMUCOSAL
  Filled 2023-06-22: qty 15

## 2023-06-22 MED ORDER — ROCURONIUM BROMIDE 10 MG/ML (PF) SYRINGE
PREFILLED_SYRINGE | INTRAVENOUS | Status: DC | PRN
  Administered 2023-06-22: 10 mg via INTRAVENOUS
  Administered 2023-06-22: 30 mg via INTRAVENOUS

## 2023-06-22 MED ORDER — HYDROCORTISONE SOD SUC (PF) 100 MG IJ SOLR
INTRAMUSCULAR | Status: DC | PRN
  Administered 2023-06-22: 250 mg via INTRAVENOUS

## 2023-06-22 MED ORDER — CEFAZOLIN SODIUM-DEXTROSE 2-4 GM/100ML-% IV SOLN
INTRAVENOUS | Status: AC
  Filled 2023-06-22: qty 100

## 2023-06-22 MED ORDER — ACETAMINOPHEN 10 MG/ML IV SOLN: 1000.0000 mg | Freq: Once | INTRAVENOUS | Status: DC | PRN

## 2023-06-22 MED ORDER — HYDROCORTISONE SOD SUC (PF) 250 MG IJ SOLR
INTRAMUSCULAR | Status: AC
  Filled 2023-06-22: qty 250

## 2023-06-22 MED ORDER — STERILE WATER FOR IRRIGATION IR SOLN
Status: DC | PRN
Start: 2023-06-22 — End: 2023-06-22
  Administered 2023-06-22: 1000 mL

## 2023-06-22 MED ORDER — OXYCODONE HCL 5 MG PO TABS: 5.0000 mg | ORAL_TABLET | Freq: Once | ORAL | Status: DC | PRN

## 2023-06-22 MED ORDER — ROCURONIUM BROMIDE 10 MG/ML (PF) SYRINGE
PREFILLED_SYRINGE | INTRAVENOUS | Status: AC
  Filled 2023-06-22: qty 10

## 2023-06-22 MED ORDER — FENTANYL CITRATE (PF) 250 MCG/5ML IJ SOLN
INTRAMUSCULAR | Status: AC
  Filled 2023-06-22: qty 5

## 2023-06-22 SURGICAL SUPPLY — 64 items
ADH SKN CLS APL DERMABOND .7 (GAUZE/BANDAGES/DRESSINGS) ×1
AGENT HMST KT MTR STRL THRMB (HEMOSTASIS) ×1
BAG COUNTER SPONGE SURGICOUNT (BAG) ×2 IMPLANT
BAG SPNG CNTER NS LX DISP (BAG) ×1
BANDAGE ESMARK 6X9 LF (GAUZE/BANDAGES/DRESSINGS) IMPLANT
BNDG CMPR 9X6 STRL LF SNTH (GAUZE/BANDAGES/DRESSINGS)
BNDG ESMARK 6X9 LF (GAUZE/BANDAGES/DRESSINGS)
CANISTER SUCT 3000ML PPV (MISCELLANEOUS) ×2 IMPLANT
CANNULA VESSEL 3MM 2 BLNT TIP (CANNULA) ×2 IMPLANT
CATH EMB 4FR 40 (CATHETERS) IMPLANT
CLIP TI MEDIUM 24 (CLIP) ×2 IMPLANT
CLIP TI WIDE RED SMALL 24 (CLIP) ×2 IMPLANT
CUFF TOURN SGL QUICK 24 (TOURNIQUET CUFF)
CUFF TOURN SGL QUICK 34 (TOURNIQUET CUFF)
CUFF TOURN SGL QUICK 42 (TOURNIQUET CUFF) IMPLANT
CUFF TRNQT CYL 24X4X16.5-23 (TOURNIQUET CUFF) IMPLANT
CUFF TRNQT CYL 34X4.125X (TOURNIQUET CUFF) IMPLANT
DERMABOND ADVANCED .7 DNX12 (GAUZE/BANDAGES/DRESSINGS) ×2 IMPLANT
DRAIN CHANNEL 15F RND FF W/TCR (WOUND CARE) IMPLANT
DRAIN RELI 100 BL SUC LF ST (DRAIN)
DRAPE HALF SHEET 40X57 (DRAPES) IMPLANT
DRAPE X-RAY CASS 24X20 (DRAPES) IMPLANT
ELECT REM PT RETURN 9FT ADLT (ELECTROSURGICAL) ×1
ELECTRODE REM PT RTRN 9FT ADLT (ELECTROSURGICAL) ×2 IMPLANT
EVACUATOR SILICONE 100CC (DRAIN) IMPLANT
GLOVE SURG SS PI 7.5 STRL IVOR (GLOVE) ×6 IMPLANT
GOWN STRL REUS W/ TWL LRG LVL3 (GOWN DISPOSABLE) ×4 IMPLANT
GOWN STRL REUS W/ TWL XL LVL3 (GOWN DISPOSABLE) ×2 IMPLANT
GOWN STRL REUS W/TWL LRG LVL3 (GOWN DISPOSABLE) ×2
GOWN STRL REUS W/TWL XL LVL3 (GOWN DISPOSABLE) ×1
HEMOSTAT SNOW SURGICEL 2X4 (HEMOSTASIS) IMPLANT
INSERT FOGARTY SM (MISCELLANEOUS) IMPLANT
KIT BASIN OR (CUSTOM PROCEDURE TRAY) ×2 IMPLANT
KIT TURNOVER KIT B (KITS) ×2 IMPLANT
MARKER GRAFT CORONARY BYPASS (MISCELLANEOUS) IMPLANT
NS IRRIG 1000ML POUR BTL (IV SOLUTION) ×4 IMPLANT
PACK PERIPHERAL VASCULAR (CUSTOM PROCEDURE TRAY) ×2 IMPLANT
PAD ARMBOARD 7.5X6 YLW CONV (MISCELLANEOUS) ×4 IMPLANT
PATCH VASC XENOSURE 1CMX6CM (Vascular Products) ×1 IMPLANT
PATCH VASC XENOSURE 1X6 (Vascular Products) IMPLANT
SET COLLECT BLD 21X3/4 12 (NEEDLE) IMPLANT
STOPCOCK 4 WAY LG BORE MALE ST (IV SETS) IMPLANT
SURGIFLO W/THROMBIN 8M KIT (HEMOSTASIS) IMPLANT
SUT ETHILON 3 0 PS 1 (SUTURE) IMPLANT
SUT GORETEX 6.0 TT13 (SUTURE) IMPLANT
SUT GORETEX 6.0 TT9 (SUTURE) IMPLANT
SUT PROLENE 5 0 C 1 24 (SUTURE) ×2 IMPLANT
SUT PROLENE 6 0 BV (SUTURE) ×2 IMPLANT
SUT PROLENE 7 0 BV 1 (SUTURE) IMPLANT
SUT PROLENE 7 0 BV1 MDA (SUTURE) IMPLANT
SUT SILK 2 0 SH (SUTURE) ×2 IMPLANT
SUT SILK 3 0 (SUTURE)
SUT SILK 3-0 18XBRD TIE 12 (SUTURE) IMPLANT
SUT VIC AB 2-0 CT1 27 (SUTURE) ×2
SUT VIC AB 2-0 CT1 TAPERPNT 27 (SUTURE) ×4 IMPLANT
SUT VIC AB 3-0 SH 27 (SUTURE) ×2
SUT VIC AB 3-0 SH 27X BRD (SUTURE) ×4 IMPLANT
SUT VICRYL 4-0 PS2 18IN ABS (SUTURE) ×4 IMPLANT
SYR 3ML LL SCALE MARK (SYRINGE) IMPLANT
TOWEL GREEN STERILE (TOWEL DISPOSABLE) ×2 IMPLANT
TRAY FOLEY MTR SLVR 16FR STAT (SET/KITS/TRAYS/PACK) ×2 IMPLANT
TUBING EXTENTION W/L.L. (IV SETS) IMPLANT
UNDERPAD 30X36 HEAVY ABSORB (UNDERPADS AND DIAPERS) ×2 IMPLANT
WATER STERILE IRR 1000ML POUR (IV SOLUTION) ×2 IMPLANT

## 2023-06-22 NOTE — Anesthesia Postprocedure Evaluation (Signed)
Anesthesia Post Note  Patient: [name]  Procedure(s) Performed: FEMORAL THROMBECTOMY (Right: Groin) PATCH ANGIOPLASTY OF RIGHT FEMORAL ARTERY USING BOVINE PERICARDIAL PATCH (Right: Groin) RIGHT FEMORAL ENDARTERECTOMY WITH PATCH ANGIOPLASTY (Right: Groin)     Patient location during evaluation: PACU Anesthesia Type: General Level of consciousness: awake and alert Pain management: pain level controlled Vital Signs Assessment: post-procedure vital signs reviewed and stable Respiratory status: spontaneous breathing, nonlabored ventilation and respiratory function stable Cardiovascular status: stable and blood pressure returned to baseline Anesthetic complications: no   No notable events documented.  Last Vitals:  Vitals:   06/22/23 1726 06/22/23 1740  BP: (!) 147/77 (!) 158/65  Pulse: 91 84  Resp: 17 13  Temp: (!) 36.4 C   SpO2: 99% 97%    Last Pain:  Vitals:   06/22/23 1726  TempSrc: Oral  PainSc:                  Beryle Lathe

## 2023-06-22 NOTE — Plan of Care (Signed)

## 2023-06-22 NOTE — Consult Note (Signed)
[facility] [place]  Patient name: [name] MRN: [id number] DOB: 1941/10/13 Sex: female   REQUESTING PROVIDER:    cardiology   REASON FOR CONSULT:    Right femoral occlusion  HISTORY OF PRESENT ILLNESS:   [name] is a 82 y.o. female, who is status post TAVR yesterday.  This morning she was complaining of right leg numbness and pain.  Right leg slightly cooler than the right.  Arterial duplex was obtained which showed occlusion of the common femoral artery.  Vascular consultation was obtained  Patient has a history of CABG in 2007.  She is on statin for hyperlipidemia.  She is medically managed for hypertension.  She is a non-smoker.  She lives by herself.  PAST MEDICAL HISTORY    Past Medical History:  Diagnosis Date   Allergy    SEASONAL   Anxiety    BREAST CANCER 1991   left, 1991; B mastectomy   CAD (coronary artery disease)    s/p CABG x5 in 2007   Cataract    BILATERAL-REMOVED   Depression    DIVERTICULOSIS    GERD    History of DVT (deep vein thrombosis)    HYPERLIPIDEMIA    Hypertension    HYPOTHYROIDISM    Occlusion and stenosis of carotid artery without mention of cerebral infarction    OSTEOPOROSIS    S/P TAVR (transcatheter aortic valve replacement) 06/21/2023   s/p TAVR with a 23mm Edwards S3UR via the TF approach by Dr. Clifton James & Dr. Leafy Ro   Severe aortic stenosis      FAMILY HISTORY   Family History  Problem Relation Age of Onset   Colon cancer Sister    Colon cancer Sister    Breast cancer Mother    Lung disease Mother    Emphysema Father    Esophageal cancer Neg Hx    Rectal cancer Neg Hx    Stomach cancer Neg Hx    Thyroid disease Neg Hx     SOCIAL HISTORY:   Social History   Socioeconomic History   Marital status: Divorced    Spouse name: Not on file   Number of children: 2   Years of education: Not on file   Highest education level: Not on file   Occupational History   Occupation: retired  Tobacco Use   Smoking status: Never   Smokeless tobacco: Never  Vaping Use   Vaping status: Never Used  Substance and Sexual Activity   Alcohol use: No    Alcohol/week: 0.0 standard drinks of alcohol   Drug use: No   Sexual activity: Not Currently  Other Topics Concern   Not on file  Social History Narrative   Divorced x 3, lives alone   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, school psychologist   Right handed    Social Determinants of Health   Financial Resource Strain: Low Risk  (09/02/2022)   Overall Financial Resource Strain (CARDIA)    Difficulty of Paying Living Expenses: Not hard at all  Food Insecurity: No Food Insecurity (09/02/2022)   Hunger Vital Sign    Worried About Running Out of Food in the Last Year: Never true    Ran Out of Food in the Last Year: Never true  Transportation Needs: No Transportation Needs (09/02/2022)   PRAPARE - Administrator, Civil Service (Medical): No    Lack of Transportation (Non-Medical): No  Physical Activity: Inactive (09/02/2022)   Exercise  Vital Sign    Days of Exercise per Week: 0 days    Minutes of Exercise per Session: 0 min  Stress: No Stress Concern Present (09/02/2022)   [employer] of Occupational Health - Occupational Stress Questionnaire    Feeling of Stress : Not at all  Social Connections: Moderately Integrated (09/02/2022)   Social Connection and Isolation Panel [NHANES]    Frequency of Communication with Friends and Family: More than three times a week    Frequency of Social Gatherings with Friends and Family: More than three times a week    Attends Religious Services: More than 4 times per year    Active Member of [employer] or Organizations: Yes    Attends Engineer, structural: More than 4 times per year    Marital Status: Divorced  Intimate Partner Violence: Not At Risk (09/02/2022)   Humiliation, Afraid, Rape, and Kick questionnaire     Fear of Current or Ex-Partner: No    Emotionally Abused: No    Physically Abused: No    Sexually Abused: No    ALLERGIES:    Allergies  Allergen Reactions   Clarithromycin Other (See Comments)    PATIENT UNSURE OF REACTION   Clindamycin Other (See Comments)   Codeine     REACTION: nausea   Levofloxacin     REACTION: nausea   Morphine Other (See Comments)    Nausea  Nausea    Naproxen     REACTION: nausea   Omeprazole Other (See Comments)    Made her jittery, made her feel weird, ?rash   Penicillins Other (See Comments) and Rash    REACTION: RASH REACTION: RASH    Pneumococcal Vaccines Rash    Small rash on arm at injection site.     CURRENT MEDICATIONS:    Current Facility-Administered Medications  Medication Dose Route Frequency Provider Last Rate Last Admin   0.9 %  sodium chloride infusion  250 mL Intravenous PRN Janetta Hora, PA-C       acetaminophen (TYLENOL) tablet 650 mg  650 mg Oral Q6H PRN Janetta Hora, PA-C       Or   acetaminophen (TYLENOL) suppository 650 mg  650 mg Rectal Q6H PRN Janetta Hora, PA-C       aspirin EC tablet 81 mg  81 mg Oral Daily Janetta Hora, PA-C   81 mg at 06/22/23 5366   atorvastatin (LIPITOR) tablet 20 mg  20 mg Oral Daily Janetta Hora, PA-C   20 mg at 06/22/23 4403   citalopram (CELEXA) tablet 20 mg  20 mg Oral Daily Janetta Hora, PA-C   20 mg at 06/22/23 4742   empagliflozin (JARDIANCE) tablet 10 mg  10 mg Oral QAC breakfast Janetta Hora, PA-C   10 mg at 06/22/23 5956   famotidine (PEPCID) tablet 40 mg  40 mg Oral Daily Janetta Hora, PA-C   40 mg at 06/22/23 3875   levothyroxine (SYNTHROID) tablet 75 mcg  75 mcg Oral Q0600 Janetta Hora, PA-C   75 mcg at 06/22/23 6433   LORazepam (ATIVAN) tablet 1 mg  1 mg Oral Q6H PRN Janetta Hora, PA-C   1 mg at 06/21/23 1706   nitroGLYCERIN 50 mg in dextrose 5 % 250 mL (0.2 mg/mL) infusion  0-100 mcg/min Intravenous  Titrated Janetta Hora, PA-C       ondansetron [facility]) injection 4 mg  4 mg Intravenous Q6H PRN Janetta Hora, PA-C  oxyCODONE (Oxy IR/ROXICODONE) immediate release tablet 5-10 mg  5-10 mg Oral Q3H PRN Janetta Hora, PA-C   10 mg at 06/22/23 1152   sodium chloride flush (NS) 0.9 % injection 3 mL  3 mL Intravenous Q12H Janetta Hora, PA-C   3 mL at 06/22/23 4098   sodium chloride flush (NS) 0.9 % injection 3 mL  3 mL Intravenous PRN Janetta Hora, PA-C       zolpidem (AMBIEN) tablet 5 mg  5 mg Oral QHS PRN Janetta Hora, PA-C   5 mg at 06/21/23 2304    REVIEW OF SYSTEMS:   [X]  denotes positive finding, [ ]  denotes negative finding Cardiac  Comments:  Chest pain or chest pressure:    Shortness of breath upon exertion:    Short of breath when lying flat:    Irregular heart rhythm:        Vascular    Pain in calf, thigh, or hip brought on by ambulation:    Pain in feet at night that wakes you up from your sleep:     Blood clot in your veins:    Leg swelling:         Pulmonary    Oxygen at home:    Productive cough:     Wheezing:         Neurologic    Sudden weakness in arms or legs:     Sudden numbness in arms or legs:     Sudden onset of difficulty speaking or slurred speech:    Temporary loss of vision in one eye:     Problems with dizziness:         Gastrointestinal    Blood in stool:      Vomited blood:         Genitourinary    Burning when urinating:     Blood in urine:        Psychiatric    Major depression:         Hematologic    Bleeding problems:    Problems with blood clotting too easily:        Skin    Rashes or ulcers:        Constitutional    Fever or chills:     PHYSICAL EXAM:   Vitals:   06/22/23 0506 06/22/23 0713 06/22/23 0725 06/22/23 1102  BP:   (!) 102/52 (!) 120/95  Pulse: 75 77 81 82  Resp: 14 14 14 17   Temp:  98.4 F (36.9 C) 98.4 F (36.9 C) 97.9 F (36.6 C)  TempSrc:  Oral Oral Oral   SpO2: 97% 92% 96% 98%  Weight: 58.8 kg     Height:        GENERAL: The patient is a well-nourished female, in no acute distress. The vital signs are documented above. CARDIAC: There is a regular rate and rhythm.  VASCULAR: Palpable left posterior tibial pulse.  I was unable to get Doppler signals in right leg.  Her calf is tender to the touch.  She has decreased motor and sensation of the right foot and lower leg.  She is tender in her right groin under her dressing. PULMONARY: Nonlabored respirations ABDOMEN: Soft and non-tender MUSCULOSKELETAL: There are no major deformities or cyanosis. SKIN: There are no ulcers or rashes noted. PSYCHIATRIC: The patient has a normal affect.  STUDIES:   I have reviewed the following ultrasound study:  Right: Total occlusion noted in the common femoral artery. Arterial  study  of the right leg shows and occluded CFA and String flow in the EIA. There  is a collateral pathway connecting the limited ECA flow to the Profunda  Artery, resulting in monophasic  flow throughout the right leg.   ASSESSMENT and PLAN   Right common femoral artery occlusion: I discussed with the patient that she needs to go to the operating room now for femoral endarterectomy/thrombectomy.  I discussed the possibility of compartment syndrome.  I would not plan on doing fasciotomies at the time of the operation but this may need to occur in a delayed fashion.  All of her questions were answered.  I will speak to her daughter over the phone who is dealing with COVID.   Charlena Cross, MD, FACS [facility] [phone] Pager [phone]

## 2023-06-22 NOTE — Progress Notes (Signed)
Status post right femoral endarterectomy Patient is resting comfortably in the PACU, still sedated. She has a palpable posterior tibial pulse.  Her calf is soft without evidence of compartment syndrome. Blood gas shows a potassium of 3.6 and normal pH.  The patient is safe to be transported back to the floor.  She will need to be monitored overnight for development of a compartment syndrome.  I will also repeat her labs to make sure that she does not become hyperkalemic or developed progressive acidosis.  Durene Cal

## 2023-06-22 NOTE — Progress Notes (Addendum)
[facility]   MULTIDISCIPLINARY HEART VALVE TEAM  Patient Name: [name] Date of Encounter: 06/22/2023  Admit date: 06/21/2023  Primary Care Provider: Pincus Sanes, MD [facility] Cardiologist: Tonny Bollman, MD  [facility] Electrophysiologist:  None   Hospital Problem List     Principal Problem:   S/P TAVR (transcatheter aortic valve replacement) Active Problems:   Personal history of malignant neoplasm of breast   Severe aortic stenosis   GERD (gastroesophageal reflux disease)   Hypothyroidism   Hyperlipidemia   Carotid artery disease (HCC)   Chronic venous insufficiency   Essential hypertension   Chronic diastolic heart failure (HCC)   History of CVA (cerebrovascular accident)   CAD (coronary artery disease)     Subjective   Many complaints. Right leg numb and weak. Feels weak all over and cannot go home today.   Inpatient Medications    Scheduled Meds:  aspirin EC  81 mg Oral Daily   atorvastatin  20 mg Oral Daily   citalopram  20 mg Oral Daily   empagliflozin  10 mg Oral QAC breakfast   famotidine  40 mg Oral Daily   levothyroxine  75 mcg Oral Q0600   sodium chloride flush  3 mL Intravenous Q12H   Continuous Infusions:  sodium chloride     nitroGLYCERIN     PRN Meds: sodium chloride, acetaminophen **OR** acetaminophen, LORazepam, ondansetron (ZOFRAN) IV, oxyCODONE, sodium chloride flush, zolpidem   Vital Signs    Vitals:   06/22/23 0337 06/22/23 0506 06/22/23 0713 06/22/23 0725  BP: (!) 114/42   (!) 102/52  Pulse: 83 75 77 81  Resp: 15 14 14 14   Temp: 98.6 F (37 C)  98.4 F (36.9 C)   TempSrc: Oral  Oral   SpO2: 97% 97% 92% 96%  Weight:  58.8 kg    Height:        Intake/Output Summary (Last 24 hours) at 06/22/2023 1025 Last data filed at 06/22/2023 0812 Gross per 24 hour  Intake 1608.33 ml  Output 1070 ml  Net 538.33 ml   Filed Weights   06/21/23 0943 06/22/23 0506  Weight: 56.7 kg 58.8 kg    Physical Exam     GEN: Well nourished, well developed, in no acute distress.  HEENT: Grossly normal.  Neck: Supple, no JVD, carotid bruits, or masses. Cardiac: RRR, 3/6 sysolic murmur. No rubs, or gallops. No clubbing, cyanosis, edema.   Respiratory:  Respirations regular and unlabored, clear to auscultation bilaterally. GI: Soft, nontender, nondistended, BS + x 4. MS: no deformity or atrophy. Skin: warm and dry, no rash.  Groin sites clear without hematoma or ecchymosis. Right leg slightly cooler than left but pulses able to be dopplered  Neuro:  Strength and sensation are intact. Psych: AAOx3.  Normal affect.  Labs    CBC Recent Labs    06/21/23 1346 06/21/23 2357  WBC  --  8.6  HGB 11.6* 11.9*  HCT 34.0* 36.5  MCV  --  91.3  PLT  --  212   Basic Metabolic Panel Recent Labs    16/10/96 1346 06/21/23 2357  NA 139 129*  K 3.9 3.8  CL 101 99  CO2  --  24  GLUCOSE 121* 116*  BUN 9 9  CREATININE 0.50 0.66  CALCIUM  --  8.2*  MG  --  1.9   Liver Function Tests No results for input(s): "AST", "ALT", "ALKPHOS", "BILITOT", "PROT", "ALBUMIN" in the last 72 hours. No results  for input(s): "LIPASE", "AMYLASE" in the last 72 hours. Cardiac Enzymes No results for input(s): "CKTOTAL", "CKMB", "CKMBINDEX", "TROPONINI" in the last 72 hours. BNP Invalid input(s): "POCBNP" D-Dimer No results for input(s): "DDIMER" in the last 72 hours. Hemoglobin A1C No results for input(s): "HGBA1C" in the last 72 hours. Fasting Lipid Panel No results for input(s): "CHOL", "HDL", "LDLCALC", "TRIG", "CHOLHDL", "LDLDIRECT" in the last 72 hours. Thyroid Function Tests No results for input(s): "TSH", "T4TOTAL", "T3FREE", "THYROIDAB" in the last 72 hours.  Invalid input(s): "FREET3"  Telemetry    Sinus, 5 beat run NSVT - Personally Reviewed  ECG    Sinus, HR 73  - Personally Reviewed  Radiology    ECHOCARDIOGRAM LIMITED  Result Date: 06/21/2023    ECHOCARDIOGRAM LIMITED REPORT   Patient Name:    [name] Date of Exam: 06/21/2023 Medical Rec #:  [id number]     Height:       62.0 in Accession #:    [id number]    Weight:       125.0 lb Date of Birth:  [birth year]      BSA:          1.566 m Patient Age:    82 years      BP:           158/60 mmHg Patient Gender: F             HR:           69 bpm. Exam Location:  Inpatient Procedure: Limited Echo, Limited Color Doppler and Cardiac Doppler Indications:     aortic stenosis  History:         Patient has prior history of Echocardiogram examinations, most                  recent 02/23/2023. CAD, history of breast cancer; Risk                  Factors:Hypertension and Dyslipidemia.                  Aortic Valve: 20 mm Edwards Ultra, stented (TAVR) valve is                  present in the aortic position.  Sonographer:     Delcie Roch RDCS Referring Phys:  [id number] Wille Celeste THOMPSON Diagnosing Phys: Thurmon Fair MD  Sonographer Comments: Image acquisition challenging due to breast implants. PREOPERATIVE EVALUATION Normal left ventricular systolic function, LVEF 65%. Trileaflet aortic valve with severe calcific stenosis. Peak gradient 30 mm Hg, mean gradient 17 mm Hg, dimensionless index 0.26, calculated AV area 0.49cm sq (0.31cm sq/ m sq indexed for BSA). Gradients are likely underestimated due to difficult Doppler beam alignment. Mild aortic insufficiency. Mild mitral insufficiency. No pericardial effusion. POSTOPERATIVE EVALUATION Hyperdynamic left ventricular systolic function, LVEF 70%. Well seated TAVR stent-valve. Peak gradient 8mm Hg, mean gradient 4 mm Hg, dimensionless index 0.71, calculated AV area 2.11 cm sq (1.34 cm sq/ m sq indexed for BSA), acelleration time 60 ms. Trivial perivalvular aortic insufficiency (in the area of the left coronary ostium, "3 o'clock"). Mild mitral insufficiency. No pericardial effusion.  IMPRESSIONS  1. Left ventricular diastolic parameters are consistent with Grade II diastolic dysfunction (pseudonormalization). Elevated  left atrial pressure.  2. Right ventricular systolic function is normal. The right ventricular size is normal.  3. Left atrial size was moderately dilated.  4. The mitral valve is degenerative. Moderate mitral annular calcification.  5. The aortic  valve has been repaired/replaced. There is a 20 mm Edwards Ultra, stented (TAVR) valve present in the aortic position. Aortic valve mean gradient measures 4.0 mmHg. Aortic valve Vmax measures 1.40 m/s. Aortic valve acceleration time measures 60 msec. FINDINGS  Left Ventricle: Left ventricular diastolic parameters are consistent with Grade II diastolic dysfunction (pseudonormalization). Elevated left atrial pressure. Right Ventricle: The right ventricular size is normal. Right ventricular systolic function is normal. Left Atrium: Left atrial size was moderately dilated. Pericardium: There is no evidence of pericardial effusion. Mitral Valve: The mitral valve is degenerative in appearance. Moderate mitral annular calcification. MV peak gradient, 10.6 mmHg. The mean mitral valve gradient is 3.0 mmHg. Tricuspid Valve: The tricuspid valve is not well visualized. Aortic Valve: The aortic valve has been repaired/replaced. Aortic valve mean gradient measures 4.0 mmHg. Aortic valve peak gradient measures 7.8 mmHg. Aortic valve area, by VTI measures 1.93 cm. There is a 20 mm Edwards Ultra, stented (TAVR) valve present in the aortic position. Pulmonic Valve: The pulmonic valve was not well visualized. Aorta: The aortic root is normal in size and structure. Additional Comments: Spectral Doppler performed. Color Doppler performed.  LEFT VENTRICLE PLAX 2D LVOT diam:     1.90 cm LV SV:         62 LV SV Index:   40 LVOT Area:     2.84 cm  AORTIC VALVE AV Area (Vmax):    1.82 cm AV Area (Vmean):   1.96 cm AV Area (VTI):     1.93 cm AV Vmax:           140.00 cm/s AV Vmean:          95.400 cm/s AV VTI:            0.324 m AV Peak Grad:      7.8 mmHg AV Mean Grad:      4.0 mmHg LVOT Vmax:          89.80 cm/s LVOT Vmean:        65.950 cm/s LVOT VTI:          0.220 m LVOT/AV VTI ratio: 0.68  AORTA Ao Root diam: 2.50 cm MITRAL VALVE MV Area (PHT): 2.97 cm   SHUNTS MV Area VTI:   0.95 cm   Systemic VTI:  0.22 m MV Peak grad:  10.6 mmHg  Systemic Diam: 1.90 cm MV Mean grad:  3.0 mmHg MV Vmax:       1.63 m/s MV Vmean:      79.8 cm/s Rachelle Hora Croitoru MD Electronically signed by Thurmon Fair MD Signature Date/Time: 06/21/2023/6:25:15 PM    Final (Updated)    Structural Heart Procedure  Result Date: 06/21/2023 See surgical note for result.   Cardiac Studies   [facility]  TAVR OPERATIVE NOTE     Date of Procedure:                06/21/2023   Preoperative Diagnosis:      Severe Aortic Stenosis    Postoperative Diagnosis:    Same    Procedure:        Transcatheter Aortic Valve Replacement - Transfemoral Approach             Edwards Sapien 3 THV (size 20 mm, model # [id number] , serial # [id number] )              Co-Surgeons:  Verne Carrow, MD and Eugenio Hoes , MD    Anesthesiologist:                  Bradley Ferris   Echocardiographer:              Croitoru   Pre-operative Echo Findings: Severe aortic stenosis Normal left ventricular systolic function   Post-operative Echo Findings: Trivial paravalvular leak Normal left ventricular systolic function   _____________     Echo 06/22/23: pending  Patient Profile     FRANNY DEBAKER is a 82 y.o. female with a history of CAD s/p CABG x5 2007, HTN, HLD, hypothyroidism, chronic diastolic HF, breast cancer s/p mastectomy, and aortic stenosis who presented to [facility] on 06/21/23 for planned TAVR.   Assessment & Plan    Severe AS: s/p successful TAVR with a 20 mm Edwards Sapien 3 Ultra Resilia THV via the TF approach on 06/21/23. Post operative echo completed but pending formal read. Groin sites are stable. ECG with sinus and no high grade heart block. Continued on home Asprin. Plan to keep overnight  given weakness and leg pain.   R leg pain: patient complaining of right leg numbness and pain. We were able to doppler pulses. Leg slightly cooler then left leg. Will get arterial duplex since this was the side of the perclose.   Hyponatremia: NA 129. Will continue to monitor.   NSVT: 5 beat run noted on tele. Home BB held given soft BPs.   CAD s/p CABG: pre TAVR cath showed triple vessel CAD s/p 5V CABG with 5/5 patent bypass grafts. Continue medical therapy.   HTN: BP was initially elevated but now soft. Hold home losartan 50mg  daily and Metoprolol 25mg  BID  Chronic diastolic CHF: appears euvolemic. Will resume home Lasix at discharge.    SignedCline Crock, PA-C  06/22/2023, 10:25 AM  Pager [phone]  I have personally seen and examined this patient. I agree with the assessment and plan as outlined above.  Right leg numbness and pain. Doppler with femoral artery occlusion at Perclose site. Will ask Vascular surgery to see her.  Verne Carrow, MD, [facility] 06/22/2023 12:20 PM

## 2023-06-22 NOTE — Transfer of Care (Signed)
Immediate Anesthesia Transfer of Care Note  Patient: [name]  Procedure(s) Performed: FEMORAL THROMBECTOMY (Right: Groin) PATCH ANGIOPLASTY OF RIGHT FEMORAL ARTERY USING BOVINE PERICARDIAL PATCH (Right: Groin) RIGHT FEMORAL ENDARTERECTOMY WITH PATCH ANGIOPLASTY (Right: Groin)  Patient Location: PACU  Anesthesia Type:General  Level of Consciousness: drowsy and patient cooperative  Airway & Oxygen Therapy: Patient Spontanous Breathing and Patient connected to face mask oxygen  Post-op Assessment: Report given to RN and Post -op Vital signs reviewed and stable  Post vital signs: Reviewed and stable  Last Vitals:  Vitals Value Taken Time  BP 177/62   Temp    Pulse 85   Resp 18   SpO2 96     Last Pain:  Vitals:   06/22/23 1329  TempSrc:   PainSc: 0-No pain      Patients Stated Pain Goal: 0 (06/22/23 1149)  Complications: No notable events documented.

## 2023-06-22 NOTE — Anesthesia Preprocedure Evaluation (Addendum)
Anesthesia Evaluation  Patient identified by MRN, date of birth, ID band Patient awake    Reviewed: Allergy & Precautions, NPO status , Patient's Chart, lab work & pertinent test results, reviewed documented beta blocker date and time   Airway Mallampati: II  TM Distance: >3 FB Neck ROM: Full    Dental  (+) Dental Advisory Given   Pulmonary asthma    Pulmonary exam normal        Cardiovascular hypertension, Pt. on medications and Pt. on home beta blockers + CAD, + CABG and + DVT  Normal cardiovascular exam+ Valvular Problems/Murmurs   ECHO (pre-TAVR) 1. Left ventricular ejection fraction, by estimation, is 60 to 65%. Left  ventricular ejection fraction by PLAX is 64 %. The left ventricle has  normal function. The left ventricle has no regional wall motion  abnormalities. There is mild left ventricular  hypertrophy. Left ventricular diastolic parameters are consistent with  Grade III diastolic dysfunction (restrictive). Elevated left ventricular  end-diastolic pressure.   2. Right ventricular systolic function is low normal. The right  ventricular size is normal. There is moderately elevated pulmonary artery  systolic pressure. The estimated right ventricular systolic pressure is  51.6 mmHg.   3. Left atrial size was moderately dilated.   4. The mitral valve is degenerative. Trivial mitral valve regurgitation.   5. The aortic valve is calcified. There is severe calcifcation of the  aortic valve. Aortic valve regurgitation is trivial. Moderate to severe  aortic valve stenosis. Aortic regurgitation PHT measures 572 msec. Aortic  valve area, by VTI measures 0.72 cm.   Aortic valve mean gradient measures 25.0 mmHg. Aortic valve Vmax measures  3.32 m/s. Peak gradient 44 mmHg, DI 0.32.   6. The pulmonic valve was abnormal.   7. Aortic dilatation noted. There is borderline dilatation of the  ascending aorta, measuring 39 mm.   8.  The inferior vena cava is normal in size with <50% respiratory  variability, suggesting right atrial pressure of 8 mmHg.     Neuro/Psych  PSYCHIATRIC DISORDERS Anxiety Depression    negative neurological ROS     GI/Hepatic Neg liver ROS,GERD  Medicated and Controlled,,  Endo/Other  Hypothyroidism    Renal/GU negative Renal ROS     Musculoskeletal  (+) Arthritis ,    Abdominal   Peds  Hematology negative hematology ROS (+)   Anesthesia Other Findings S/p TAVR yesterday, now with cold leg  Reproductive/Obstetrics                             Anesthesia Physical Anesthesia Plan  ASA: 4 and emergent  Anesthesia Plan: General   Post-op Pain Management: Ofirmev IV (intra-op)*   Induction: Intravenous  PONV Risk Score and Plan: 3 and Ondansetron, Treatment may vary due to age or medical condition and Propofol infusion  Airway Management Planned: Oral ETT  Additional Equipment: None  Intra-op Plan:   Post-operative Plan: Extubation in OR  Informed Consent: I have reviewed the patients History and Physical, chart, labs and discussed the procedure including the risks, benefits and alternatives for the proposed anesthesia with the patient or authorized representative who has indicated his/her understanding and acceptance.     Dental advisory given  Plan Discussed with: CRNA and Anesthesiologist  Anesthesia Plan Comments:        Anesthesia Quick Evaluation

## 2023-06-22 NOTE — Op Note (Signed)
Patient name: [name] MRN: [id number] DOB: 29-May-1941 Sex: female  06/22/2023 Pre-operative Diagnosis: Ischemic right leg Post-operative diagnosis:  Same Surgeon:  Durene Cal Assistants:  Antony Blackbird, PA Procedure:   #1: Right femoral endarterectomy with bovine pericardial patch angioplasty   #2: Right femoral thrombectomy Anesthesia:  general Blood Loss:  minimal Specimens:  none  Findings: The Pro-glide had occluded her artery.  She had minimal atherosclerotic disease however the intima had been disrupted and so a focal endarterectomy was required with bovine patch angioplasty.  After the procedure, the patient had a palpable posterior tibial pulse  Indications: This is an 82 year old female who underwent TAVR procedure yesterday.  I was asked to see her today following a duplex ultrasound that showed an occluded common femoral artery.  On exam, I was unable to get a pedal Doppler signal and she had decreased motor and sensory function to the right leg.  Therefore I felt that she needed to go to the operating room emergently.  I discussed the possibility of delayed fasciotomies.  I spoke with her daughter who was at home with COVID and discussed the procedure with her.  The patient has recently had lunch however I do not think that we should delay her duration  Procedure:  The patient was identified in the holding area and taken to [facility] OR ROOM 16  The patient was then placed supine on the table. general anesthesia was administered.  The patient was prepped and draped in the usual sterile fashion.  A time out was called and antibiotics were administered.  PA was necessary to expedite the procedure and assist with technical details.  She helped with exposure by providing suction and retraction.  She helped with the anastomosis by following the suture.  Using the patient's previous stab incision which was above the groin crease, I extended this to make an oblique incision.  Cautery was used  to divide the subcutaneous tissue down to the femoral sheath.  There was a fair amount of scar tissue around the artery.  I had to use a hand-held retractor to gain exposure of the distal external iliac artery.  The stick site was in the proximal common femoral artery.  Expose the common femoral artery below the stick site but did not have to dissect out the superficial femoral and profundofemoral arteries.  Once I had adequate exposure, the patient was fully heparinized.  I occluded the artery with a Henley clamp proximally and a peripheral DeBakey clamp distally.  I then remove the Pro-glide sutures with #11 blade.  It was difficult to find a lumen because the intima was heaped up inside of the artery.  I opened the arteriotomy longitudinally with Potts scissors.  There was fresh thrombus within the common femoral artery which was evacuated.  I had to perform a endarterectomy to remove the diseased intima.  I got a good distal endpoint and tacked this down with 7-0 Prolene.  A bovine pericardial patch was then brought onto the table after to been appropriately washed.  Patch angioplasty was performed with a running 6-0 Prolene with the PA following the suture.  Prior to completion the appropriate flushing maneuvers were performed.  I copiously irrigated the artery with heparinized saline and then completed the patch repair.  Blood flow was then reestablished to the right leg.  She had a palpable posterior tibial pulse.  The heparin was then reversed with 50 mg of protamine.  Hemostasis was achieved.  The groin was  then irrigated with saline.  The femoral sheath was reapproximated with running 2-0 Vicryl.  The subcutaneous tissue was then closed with additional layers of Vicryl followed by subcuticular closure and Dermabond.  She was successfully extubated and taken the recovery room in stable condition.  There were no immediate complications.   Disposition: To PACU stable.   Juleen China, M.D.,  [facility] [place] Office: [phone] Pager:  [phone]

## 2023-06-22 NOTE — Progress Notes (Signed)
CARDIAC REHAB PHASE I   PRE:  Rate/Rhythm: 75 SR   BP:  Sitting: 102/52      SaO2: 97 RA  MODE:  Ambulation: bed to door and back to bed   POST:  Rate/Rhythm: 85 SR   BP:  Sitting: 150/56      SaO2: 95 RA   Pt ambulated with assistance using front wheel walker, bed to door and back to bed. Pt tolerated fairly well. Pt has complaints of both legs feeling numb and sore and generalized weakness. Pt maintained steady and stable gait, but pt did not feel up to walking further. Post TAVR education including site care, restrictions, risk factors, heart healthy diet, exercise guidelines and CRP2 reviewed. All questions and concerns addressed. Pt is not interested in CRP2 at this time.   [id number] Woodroe Chen, RN BSN 06/22/2023 10:27 AM

## 2023-06-22 NOTE — Progress Notes (Signed)
Lower extremity arterial duplex completed. Please see CV Procedures for preliminary results.  Initial findings reported to Cline Crock, PA-C.  Shona Simpson, RVT 06/22/23 11:27 AM

## 2023-06-22 NOTE — Anesthesia Procedure Notes (Addendum)
Procedure Name: Intubation Date/Time: 06/22/2023 1:55 PM  Performed by: Cy Blamer, CRNAPre-anesthesia Checklist: Patient identified, Emergency Drugs available, Suction available and Patient being monitored Patient Re-evaluated:Patient Re-evaluated prior to induction Oxygen Delivery Method: Circle system utilized Preoxygenation: Pre-oxygenation with 100% oxygen Induction Type: IV induction, Cricoid Pressure applied and Rapid sequence Laryngoscope Size: Miller and 2 Grade View: Grade II Tube type: Oral Tube size: 7.0 mm Number of attempts: 1 Airway Equipment and Method: Stylet Placement Confirmation: ETT inserted through vocal cords under direct vision, positive ETCO2 and breath sounds checked- equal and bilateral Secured at: 21 cm Tube secured with: Tape Dental Injury: Teeth and Oropharynx as per pre-operative assessment  Difficulty Due To: Difficult Airway- due to limited oral opening Comments: RSI for patient reported meal 60 min prior/emergent case

## 2023-06-22 NOTE — Progress Notes (Signed)
Echocardiogram 2D Echocardiogram has been performed.  Warren Lacy  RDCS 06/22/2023, 9:43 AM  Dr Royann Shivers notified

## 2023-06-23 ENCOUNTER — Encounter (HOSPITAL_COMMUNITY): Payer: Self-pay | Admitting: Surgery

## 2023-06-23 DIAGNOSIS — Z952 Presence of prosthetic heart valve: Secondary | ICD-10-CM | POA: Diagnosis not present

## 2023-06-23 MED ORDER — POLYETHYLENE GLYCOL 3350 17 G PO PACK
17.0000 g | PACK | Freq: Every day | ORAL | Status: DC | PRN
  Administered 2023-06-23 – 2023-06-25 (×2): 17 g via ORAL
  Filled 2023-06-23 (×2): qty 1

## 2023-06-23 MED ORDER — METOPROLOL TARTRATE 25 MG PO TABS
25.0000 mg | ORAL_TABLET | Freq: Two times a day (BID) | ORAL | Status: DC
  Administered 2023-06-23 – 2023-06-27 (×9): 25 mg via ORAL
  Filled 2023-06-23 (×9): qty 1

## 2023-06-23 NOTE — Progress Notes (Signed)
EOS:  Pulses palpable to both feet & on doppler Skin warm

## 2023-06-23 NOTE — Progress Notes (Addendum)
  Progress Note    06/23/2023 7:52 AM 1 Day Post-Op  Subjective:  no real complaints, says she sort of feels numb all over from just laying in the bed for so long   Vitals:   06/23/23 0422 06/23/23 0500  BP:  137/61  Pulse: 89 92  Resp: 17 17  Temp:    SpO2: 95% 96%   Physical Exam: Cardiac:  regular Lungs:  non labored Incisions:  right groin incision is clean, dry and intact. No swelling or hematoma. Mild ecchymosis present, tenderness as expected Extremities:  RLE well perfused and warm with palpable PT pulse. Right Calf soft Abdomen:  soft Neurologic: alert and oriented  CBC    Component Value Date/Time   WBC 9.8 06/22/2023 2332   RBC 3.86 (L) 06/22/2023 2332   HGB 11.7 (L) 06/22/2023 2332   HGB 12.6 07/14/2022 1552   HCT 35.2 (L) 06/22/2023 2332   HCT 37.2 07/14/2022 1552   PLT 182 06/22/2023 2332   PLT 280 07/14/2022 1552   MCV 91.2 06/22/2023 2332   MCV 88 07/14/2022 1552   MCH 30.3 06/22/2023 2332   MCHC 33.2 06/22/2023 2332   RDW 14.0 06/22/2023 2332   RDW 13.7 07/14/2022 1552   LYMPHSABS 1.4 06/02/2023 0943   MONOABS 0.5 06/02/2023 0943   EOSABS 0.3 06/02/2023 0943   BASOSABS 0.0 06/02/2023 0943    BMET    Component Value Date/Time   NA 131 (L) 06/22/2023 2332   NA 138 02/28/2023 1518   K 3.8 06/22/2023 2332   CL 100 06/22/2023 2332   CO2 23 06/22/2023 2332   GLUCOSE 136 (H) 06/22/2023 2332   BUN 11 06/22/2023 2332   BUN 15 02/28/2023 1518   CREATININE 0.74 06/22/2023 2332   CREATININE 0.68 07/30/2020 1631   CALCIUM 8.5 (L) 06/22/2023 2332   GFRNONAA >60 06/22/2023 2332   GFRNONAA 82 07/30/2020 1631   GFRAA 95 07/30/2020 1631    INR    Component Value Date/Time   INR 1.1 06/17/2023 1057     Intake/Output Summary (Last 24 hours) at 06/23/2023 0752 Last data filed at 06/23/2023 0342 Gross per 24 hour  Intake 1380 ml  Output 875 ml  Net 505 ml     Assessment/Plan:  82 y.o. female is s/p  #1: Right femoral endarterectomy with bovine  pericardial patch angioplasty  #2: Right femoral thrombectomy 1 Day Post-Op   Right groin incision is intact without swelling or hematoma Right leg well perfused and warm with palpable PT pulse. Soft compartments VSS Morning labs pending Mobilize as tolerated    Graceann Congress, PA-C [facility] [phone] 06/23/2023 7:52 AM  I agree with the above.  I have seen and examined the patient.  She has a palpable right PT pulse.  He calf is soft without evidence of compartment syndrome.  Potassium is normal.  Right groin incision is soft.  Needs to get out of bed today and ambulate.  Durene Cal

## 2023-06-23 NOTE — Progress Notes (Addendum)
[facility]   MULTIDISCIPLINARY HEART VALVE TEAM  Patient Name: [name] Date of Encounter: 06/23/2023  Admit date: 06/21/2023  Primary Care Provider: Pincus Sanes, MD [facility] Cardiologist: Tonny Bollman, MD  [facility] Electrophysiologist:  None   Hospital Problem List     Principal Problem:   S/P TAVR (transcatheter aortic valve replacement) Active Problems:   Personal history of malignant neoplasm of breast   Severe aortic stenosis   GERD (gastroesophageal reflux disease)   Hypothyroidism   Hyperlipidemia   Carotid artery disease (HCC)   Chronic venous insufficiency   Essential hypertension   Chronic diastolic heart failure (HCC)   History of CVA (cerebrovascular accident)   CAD (coronary artery disease)     Subjective   No major complaints.   Inpatient Medications    Scheduled Meds:  aspirin EC  81 mg Oral Daily   atorvastatin  20 mg Oral Daily   citalopram  20 mg Oral Daily   empagliflozin  10 mg Oral QAC breakfast   famotidine  40 mg Oral Daily   levothyroxine  75 mcg Oral Q0600   sodium chloride flush  3 mL Intravenous Q12H   Continuous Infusions:  sodium chloride     nitroGLYCERIN     PRN Meds: sodium chloride, acetaminophen **OR** acetaminophen, LORazepam, ondansetron (ZOFRAN) IV, oxyCODONE, sodium chloride flush, zolpidem   Vital Signs    Vitals:   06/23/23 0422 06/23/23 0500 06/23/23 0702 06/23/23 0800  BP:  137/61  (!) 148/68  Pulse: 89 92  92  Resp: 17 17  20   Temp:    98.4 F (36.9 C)  TempSrc:    Oral  SpO2: 95% 96%  97%  Weight:   58.7 kg   Height:        Intake/Output Summary (Last 24 hours) at 06/23/2023 0858 Last data filed at 06/23/2023 0342 Gross per 24 hour  Intake 1380 ml  Output 675 ml  Net 705 ml   Filed Weights   06/21/23 0943 06/22/23 0506 06/23/23 0702  Weight: 56.7 kg 58.8 kg 58.7 kg    Physical Exam    GEN: Well nourished, well developed, in no acute distress.  HEENT: Grossly  normal.  Neck: Supple, no JVD, carotid bruits, or masses. Cardiac: RRR, 3/6 sysolic murmur. No rubs, or gallops. No clubbing, cyanosis, edema.   Respiratory:  Respirations regular and unlabored, clear to auscultation bilaterally. GI: Soft, nontender, nondistended, BS + x 4. MS: no deformity or atrophy. Skin: warm and dry, no rash.  Groin sites clear without hematoma or ecchymosis. Right leg warm with good pulses Neuro:  Strength and sensation are intact. Psych: AAOx3.  Normal affect.  Labs    CBC Recent Labs    06/21/23 2357 06/22/23 1600 06/22/23 2332  WBC 8.6  --  9.8  HGB 11.9* 10.9* 11.7*  HCT 36.5 32.0* 35.2*  MCV 91.3  --  91.2  PLT 212  --  182   Basic Metabolic Panel Recent Labs    41/66/06 2357 06/22/23 1600 06/22/23 2332  NA 129* 133* 131*  K 3.8 3.5 3.8  CL 99  --  100  CO2 24  --  23  GLUCOSE 116*  --  136*  BUN 9  --  11  CREATININE 0.66  --  0.74  CALCIUM 8.2*  --  8.5*  MG 1.9  --   --    Liver Function Tests No results for input(s): "AST", "ALT", "ALKPHOS", "BILITOT", "PROT", "  ALBUMIN" in the last 72 hours. No results for input(s): "LIPASE", "AMYLASE" in the last 72 hours. Cardiac Enzymes No results for input(s): "CKTOTAL", "CKMB", "CKMBINDEX", "TROPONINI" in the last 72 hours. BNP Invalid input(s): "POCBNP" D-Dimer No results for input(s): "DDIMER" in the last 72 hours. Hemoglobin A1C No results for input(s): "HGBA1C" in the last 72 hours. Fasting Lipid Panel No results for input(s): "CHOL", "HDL", "LDLCALC", "TRIG", "CHOLHDL", "LDLDIRECT" in the last 72 hours. Thyroid Function Tests No results for input(s): "TSH", "T4TOTAL", "T3FREE", "THYROIDAB" in the last 72 hours.  Invalid input(s): "FREET3"  Telemetry    Sinus - Personally Reviewed  ECG    Sinus, HR 73  - Personally Reviewed  Radiology    VAS [place] LOWER EXTREMITY ARTERIAL DUPLEX  Result Date: 06/22/2023 LOWER EXTREMITY ARTERIAL DUPLEX STUDY Patient Name:  CARROLL ISABELLA  Date of  Exam:   06/22/2023 Medical Rec #: [id number]      Accession #:    [id number] Date of Birth: Sep 29, 1941       Patient Gender: F Patient Age:   82 years Exam Location:  [facility] Procedure:      VAS [place] LOWER EXTREMITY ARTERIAL DUPLEX Referring Phys: Samara Deist THOMPSON --------------------------------------------------------------------------------  Indications: Peripheral artery disease, and Numbness and tingling. High Risk Factors: Hypertension, hyperlipidemia, coronary artery disease.  Vascular Interventions: Transcatheter Aortic Valve Replacement 06/21/23. Current ABI:            N/A Comparison Study: No prior study Performing Technologist: Shona Simpson  Examination Guidelines: A complete evaluation includes B-mode imaging, spectral Doppler, color Doppler, and power Doppler as needed of all accessible portions of each vessel. Bilateral testing is considered an integral part of a complete examination. Limited examinations for reoccurring indications may be performed as noted.  +-----------+--------+-----+--------+----------+--------+ RIGHT      PSV cm/sRatioStenosisWaveform  Comments +-----------+--------+-----+--------+----------+--------+ EIA Distal 50                   monophasic         +-----------+--------+-----+--------+----------+--------+ CFA Distal 0            occluded                   +-----------+--------+-----+--------+----------+--------+ DFA        28                   monophasic         +-----------+--------+-----+--------+----------+--------+ SFA Prox   20                   monophasic         +-----------+--------+-----+--------+----------+--------+ SFA Mid    13                   monophasic         +-----------+--------+-----+--------+----------+--------+ SFA Distal 11                   monophasic         +-----------+--------+-----+--------+----------+--------+ POP Prox   11                   monophasic          +-----------+--------+-----+--------+----------+--------+ POP Distal 12                   monophasic         +-----------+--------+-----+--------+----------+--------+ ATA Distal 3  monophasic         +-----------+--------+-----+--------+----------+--------+ PTA Distal 5                    monophasic         +-----------+--------+-----+--------+----------+--------+ PERO Distal4                    monophasic         +-----------+--------+-----+--------+----------+--------+  +----------+--------+-----+--------+---------+--------+ LEFT      PSV cm/sRatioStenosisWaveform Comments +----------+--------+-----+--------+---------+--------+ CFA [id number]                  triphasic         +----------+--------+-----+--------+---------+--------+  Summary: Right: Total occlusion noted in the common femoral artery. Arterial study of the right leg shows and occluded CFA and String flow in the EIA. There is a collateral pathway connecting the limited ECA flow to the Profunda Artery, resulting in monophasic flow throughout the right leg.  See table(s) above for measurements and observations. Electronically signed by Coral Else MD on 06/22/2023 at 5:46:43 PM.    Final    PERIPHERAL VASCULAR CATHETERIZATION  Result Date: 06/22/2023 See surgical note for result.  ECHOCARDIOGRAM COMPLETE  Result Date: 06/22/2023    ECHOCARDIOGRAM REPORT   Patient Name:   MILDERD OZANICH Date of Exam: 06/22/2023 Medical Rec #:  [id number]     Height:       62.0 in Accession #:    [id number]    Weight:       129.6 lb Date of Birth:  12/15/1940      BSA:          1.590 m Patient Age:    82 years      BP:           102/52 mmHg Patient Gender: F             HR:           80 bpm. Exam Location:  Inpatient Procedure: 2D Echo, Color Doppler and Cardiac Doppler Indications:    Post TAVR z95.2  History:        Patient has prior history of Echocardiogram examinations, most                 recent 06/21/2023.  CAD; Risk Factors:Hypertension and                 Dyslipidemia. 20mm Edwards S3U TAVR implanted 06/21/23.                  Aortic Valve: 20 mm Edwards Ultra, stented (TAVR) valve is                 present in the aortic position. Procedure Date: 06/21/2023.  Sonographer:    Irving Burton Senior RDCS Referring Phys: [id number] Janetta Hora  Sonographer Comments: Very difficult windows due to thin body habitus and implants. IMPRESSIONS  1. Left ventricular ejection fraction, by estimation, is 65 to 70%. The left ventricle has normal function. The left ventricle has no regional wall motion abnormalities. Left ventricular diastolic parameters are consistent with Grade II diastolic dysfunction (pseudonormalization). Elevated left atrial pressure.  2. There appears to be a prominent muscular ridge (extension of the moderator band) in the mid right ventricle, separating the inflow chanber from the infundibulum and causing flow acceleration with a "gradient" of approximately 1.6 m/s. This was present on the pre-procedural images performed before TAVR deployment. The accelerated flow mimics the flow of a membranoud VSD,  but the velocity is low and there is no evidence of increased flow across the RVOT. There is no evidence of VSD. Right ventricular systolic function is hyperdynamic. The right ventricular size is normal. Tricuspid regurgitation signal is inadequate for assessing PA pressure.  3. Left atrial size was moderately dilated.  4. The mitral valve is normal in structure. Moderate mitral valve regurgitation. Moderate mitral annular calcification.  5. The aortic valve has been repaired/replaced. Aortic valve regurgitation is not visualized. No aortic stenosis is present. There is a 20 mm Edwards Ultra, stented (TAVR) valve present in the aortic position. Procedure Date: 06/21/2023. Echo findings are  consistent with normal structure and function of the aortic valve prosthesis. Aortic valve mean gradient measures 7.0 mmHg. Aortic  valve Vmax measures 1.66 m/s. Aortic valve acceleration time measures 108 msec. FINDINGS  Left Ventricle: Left ventricular ejection fraction, by estimation, is 65 to 70%. The left ventricle has normal function. The left ventricle has no regional wall motion abnormalities. The left ventricular internal cavity size was normal in size. There is  borderline concentric left ventricular hypertrophy. Left ventricular diastolic parameters are consistent with Grade II diastolic dysfunction (pseudonormalization). Elevated left atrial pressure. Right Ventricle: There appears to be a prominent muscular ridge (extension of the moderator band) in the mid right ventricle, separating the inflow chanber from the infundibulum and causing flow acceleration with a "gradient" of approximately 1.6 m/s. This was present on the pre-procedural images performed before TAVR deployment. The accelerated flow mimics the flow of a membranoud VSD, but the velocity is low and there is no evidence of increased flow across the RVOT. There is no evidence of VSD. The  right ventricular size is normal. No increase in right ventricular wall thickness. Right ventricular systolic function is hyperdynamic. Tricuspid regurgitation signal is inadequate for assessing PA pressure. Left Atrium: Left atrial size was moderately dilated. Right Atrium: Right atrial size was normal in size. Pericardium: There is no evidence of pericardial effusion. Mitral Valve: The mitral valve is normal in structure. There is mild thickening of the mitral valve leaflet(s). There is mild calcification of the mitral valve leaflet(s). Moderate mitral annular calcification. Moderate mitral valve regurgitation. MV peak gradient, 6.6 mmHg. The mean mitral valve gradient is 3.0 mmHg. Tricuspid Valve: The tricuspid valve is normal in structure. Tricuspid valve regurgitation is not demonstrated. Aortic Valve: The aortic valve has been repaired/replaced. Aortic valve regurgitation is not  visualized. No aortic stenosis is present. Aortic valve mean gradient measures 7.0 mmHg. Aortic valve peak gradient measures 11.0 mmHg. Aortic valve area, by VTI  measures 2.17 cm. There is a 20 mm Edwards Ultra, stented (TAVR) valve present in the aortic position. Procedure Date: 06/21/2023. Echo findings are consistent with normal structure and function of the aortic valve prosthesis. Pulmonic Valve: The pulmonic valve was grossly normal. Pulmonic valve regurgitation is mild to moderate. Aorta: The aortic root is normal in size and structure. IAS/Shunts: No atrial level shunt detected by color flow Doppler. No ventricular septal defect is seen or detected.  LEFT VENTRICLE PLAX 2D LVIDd:         2.90 cm   Diastology LVIDs:         2.00 cm   LV e' medial:    5.22 cm/s LV PW:         1.00 cm   LV E/e' medial:  24.3 LV IVS:        1.20 cm   LV e' lateral:   6.20 cm/s LVOT  diam:     1.90 cm   LV E/e' lateral: 20.5 LV SV:         65 LV SV Index:   41 LVOT Area:     2.84 cm  RIGHT VENTRICLE RV S prime:     10.20 cm/s TAPSE (M-mode): 2.1 cm LEFT ATRIUM           Index        RIGHT ATRIUM           Index LA diam:      3.30 cm 2.08 cm/m   RA Area:     19.40 cm LA Vol (A4C): 54.4 ml 34.22 ml/m  RA Volume:   54.90 ml  34.53 ml/m  AORTIC VALVE AV Area (Vmax):    1.95 cm AV Area (Vmean):   1.90 cm AV Area (VTI):     2.17 cm AV Vmax:           166.00 cm/s AV Vmean:          133.000 cm/s AV VTI:            0.299 m AV Peak Grad:      11.0 mmHg AV Mean Grad:      7.0 mmHg LVOT Vmax:         114.00 cm/s LVOT Vmean:        89.100 cm/s LVOT VTI:          0.229 m LVOT/AV VTI ratio: 0.77  AORTA Ao Root diam: 3.20 cm Ao Asc diam:  3.30 cm MITRAL VALVE MV Area (PHT): 1.93 cm     SHUNTS MV Area VTI:   1.75 cm     Systemic VTI:  0.23 m MV Peak grad:  6.6 mmHg     Systemic Diam: 1.90 cm MV Mean grad:  3.0 mmHg MV Vmax:       1.28 m/s MV Vmean:      86.1 cm/s MV Decel Time: 393 msec MV E velocity: 127.00 cm/s MV A velocity: 96.40  cm/s MV E/A ratio:  1.32 Mihai Croitoru MD Electronically signed by Thurmon Fair MD Signature Date/Time: 06/22/2023/12:38:52 PM    Final    ECHOCARDIOGRAM LIMITED  Result Date: 06/21/2023    ECHOCARDIOGRAM LIMITED REPORT   Patient Name:   SARAJEAN ACEITUNO Date of Exam: 06/21/2023 Medical Rec #:  [id number]     Height:       62.0 in Accession #:    [id number]    Weight:       125.0 lb Date of Birth:  December 21, 1940      BSA:          1.566 m Patient Age:    82 years      BP:           158/60 mmHg Patient Gender: F             HR:           69 bpm. Exam Location:  Inpatient Procedure: Limited Echo, Limited Color Doppler and Cardiac Doppler Indications:     aortic stenosis  History:         Patient has prior history of Echocardiogram examinations, most                  recent 02/23/2023. CAD, history of breast cancer; Risk                  Factors:Hypertension and Dyslipidemia.  Aortic Valve: 20 mm Edwards Ultra, stented (TAVR) valve is                  present in the aortic position.  Sonographer:     Delcie Roch RDCS Referring Phys:  [id number] Wille Celeste THOMPSON Diagnosing Phys: Thurmon Fair MD  Sonographer Comments: Image acquisition challenging due to breast implants. PREOPERATIVE EVALUATION Normal left ventricular systolic function, LVEF 65%. Trileaflet aortic valve with severe calcific stenosis. Peak gradient 30 mm Hg, mean gradient 17 mm Hg, dimensionless index 0.26, calculated AV area 0.49cm sq (0.31cm sq/ m sq indexed for BSA). Gradients are likely underestimated due to difficult Doppler beam alignment. Mild aortic insufficiency. Mild mitral insufficiency. No pericardial effusion. POSTOPERATIVE EVALUATION Hyperdynamic left ventricular systolic function, LVEF 70%. Well seated TAVR stent-valve. Peak gradient 8mm Hg, mean gradient 4 mm Hg, dimensionless index 0.71, calculated AV area 2.11 cm sq (1.34 cm sq/ m sq indexed for BSA), acelleration time 60 ms. Trivial perivalvular aortic insufficiency (in  the area of the left coronary ostium, "3 o'clock"). Mild mitral insufficiency. No pericardial effusion.  IMPRESSIONS  1. Left ventricular diastolic parameters are consistent with Grade II diastolic dysfunction (pseudonormalization). Elevated left atrial pressure.  2. Right ventricular systolic function is normal. The right ventricular size is normal.  3. Left atrial size was moderately dilated.  4. The mitral valve is degenerative. Moderate mitral annular calcification.  5. The aortic valve has been repaired/replaced. There is a 20 mm Edwards Ultra, stented (TAVR) valve present in the aortic position. Aortic valve mean gradient measures 4.0 mmHg. Aortic valve Vmax measures 1.40 m/s. Aortic valve acceleration time measures 60 msec. FINDINGS  Left Ventricle: Left ventricular diastolic parameters are consistent with Grade II diastolic dysfunction (pseudonormalization). Elevated left atrial pressure. Right Ventricle: The right ventricular size is normal. Right ventricular systolic function is normal. Left Atrium: Left atrial size was moderately dilated. Pericardium: There is no evidence of pericardial effusion. Mitral Valve: The mitral valve is degenerative in appearance. Moderate mitral annular calcification. MV peak gradient, 10.6 mmHg. The mean mitral valve gradient is 3.0 mmHg. Tricuspid Valve: The tricuspid valve is not well visualized. Aortic Valve: The aortic valve has been repaired/replaced. Aortic valve mean gradient measures 4.0 mmHg. Aortic valve peak gradient measures 7.8 mmHg. Aortic valve area, by VTI measures 1.93 cm. There is a 20 mm Edwards Ultra, stented (TAVR) valve present in the aortic position. Pulmonic Valve: The pulmonic valve was not well visualized. Aorta: The aortic root is normal in size and structure. Additional Comments: Spectral Doppler performed. Color Doppler performed.  LEFT VENTRICLE PLAX 2D LVOT diam:     1.90 cm LV SV:         62 LV SV Index:   40 LVOT Area:     2.84 cm  AORTIC  VALVE AV Area (Vmax):    1.82 cm AV Area (Vmean):   1.96 cm AV Area (VTI):     1.93 cm AV Vmax:           140.00 cm/s AV Vmean:          95.400 cm/s AV VTI:            0.324 m AV Peak Grad:      7.8 mmHg AV Mean Grad:      4.0 mmHg LVOT Vmax:         89.80 cm/s LVOT Vmean:        65.950 cm/s LVOT VTI:  0.220 m LVOT/AV VTI ratio: 0.68  AORTA Ao Root diam: 2.50 cm MITRAL VALVE MV Area (PHT): 2.97 cm   SHUNTS MV Area VTI:   0.95 cm   Systemic VTI:  0.22 m MV Peak grad:  10.6 mmHg  Systemic Diam: 1.90 cm MV Mean grad:  3.0 mmHg MV Vmax:       1.63 m/s MV Vmean:      79.8 cm/s Rachelle Hora Croitoru MD Electronically signed by Thurmon Fair MD Signature Date/Time: 06/21/2023/6:25:15 PM    Final (Updated)    Structural Heart Procedure  Result Date: 06/21/2023 See surgical note for result.   Cardiac Studies   [facility]  TAVR OPERATIVE NOTE     Date of Procedure:                06/21/2023   Preoperative Diagnosis:      Severe Aortic Stenosis    Postoperative Diagnosis:    Same    Procedure:        Transcatheter Aortic Valve Replacement - Transfemoral Approach             Edwards Sapien 3 THV (size 20 mm, model # [id number] , serial # [id number] )              Co-Surgeons:                        Verne Carrow, MD and Eugenio Hoes , MD    Anesthesiologist:                  Bradley Ferris   Echocardiographer:              Croitoru   Pre-operative Echo Findings: Severe aortic stenosis Normal left ventricular systolic function   Post-operative Echo Findings: Trivial paravalvular leak Normal left ventricular systolic function   _____________     Echo 06/22/23:  IMPRESSIONS   1. Left ventricular ejection fraction, by estimation, is 65 to 70%. The  left ventricle has normal function. The left ventricle has no regional  wall motion abnormalities. Left ventricular diastolic parameters are  consistent with Grade II diastolic  dysfunction (pseudonormalization). Elevated left  atrial pressure.   2. There appears to be a prominent muscular ridge (extension of the  moderator band) in the mid right ventricle, separating the inflow chanber  from the infundibulum and causing flow acceleration with a "gradient" of  approximately 1.6 m/s. This was  present on the pre-procedural images performed before TAVR deployment. The  accelerated flow mimics the flow of a membranoud VSD, but the velocity is  low and there is no evidence of increased flow across the RVOT. There is  no evidence of VSD. Right  ventricular systolic function is hyperdynamic. The right ventricular size  is normal. Tricuspid regurgitation signal is inadequate for assessing PA  pressure.   3. Left atrial size was moderately dilated.   4. The mitral valve is normal in structure. Moderate mitral valve  regurgitation. Moderate mitral annular calcification.   5. The aortic valve has been repaired/replaced. Aortic valve  regurgitation is not visualized. No aortic stenosis is present. There is a  20 mm Edwards Ultra, stented (TAVR) valve present in the aortic position.  Procedure Date: 06/21/2023. Echo findings are   consistent with normal structure and function of the aortic valve  prosthesis. Aortic valve mean gradient measures 7.0 mmHg. Aortic valve  Vmax measures 1.66 m/s. Aortic valve acceleration time measures 108 msec.  ____________________  VAS [place] LE 06/22/23 Summary:  Right: Total occlusion noted in the common femoral artery. Arterial study  of the right leg shows and occluded CFA and String flow in the EIA. There  is a collateral pathway connecting the limited ECA flow to the Profunda  Artery, resulting in monophasic  flow throughout the right leg    ____________________  06/22/2023 Pre-operative Diagnosis: Ischemic right leg Post-operative diagnosis:  Same Surgeon:  Durene Cal Assistants:  Antony Blackbird, PA Procedure:   #1: Right femoral endarterectomy with bovine pericardial patch  angioplasty                       #2: Right femoral thrombectomy Anesthesia:  general  Patient Profile     PAISLI ELLEFSEN is a 82 y.o. female with a history of CAD s/p CABG x5 2007, HTN, HLD, hypothyroidism, chronic diastolic HF, breast cancer s/p mastectomy, and aortic stenosis who presented to [facility] on 06/21/23 for planned TAVR.   Assessment & Plan    Severe AS: s/p successful TAVR with a 20 mm Edwards Sapien 3 Ultra Resilia THV via the TF approach on 06/21/23. Post operative echo showed EF 65%, normally functioning TAVR with a mean gradient of 7 mmHg and no PVL. Groin sites are stable. ECG with sinus and no high grade heart block. Continued on home Asprin.   Ischemic right leg: s/p femoral thrombectomy/endarterectomy with bovine pericardial patch angioplasty. Appreciate vascular.             Hyponatremia: NA improved to 131. Will continue to monitor.   NSVT: resumed on BB.  CAD s/p CABG: pre TAVR cath showed triple vessel CAD s/p 5V CABG with 5/5 patent bypass grafts. Continue medical therapy.   HTN: BP starting to creep up. Will add back home metoprolol.   Chronic diastolic CHF: appears euvolemic. Will resume home Lasix at discharge.    SignedCline Crock, PA-C  06/23/2023, 8:58 AM  Pager [phone]  I have personally seen and examined this patient. I agree with the assessment and plan as outlined above.  She is doing well today. Appreciate Vascular surgery assistance yesterday with her ischemic leg.  Her right leg is warm today with soft calf.  BP stable. SVT on tele Will continue to monitor, ambulate with PT  Verne Carrow, MD, [facility] 06/23/2023 9:22 AM   8:58 AM

## 2023-06-23 NOTE — Progress Notes (Signed)
CARDIAC REHAB PHASE I   PRE:  Rate/Rhythm: 98 SR   BP:  Sitting: 148/68      SaO2: 97 RA   MODE:  Ambulation: 75 ft   POST:  Rate/Rhythm: 100 ST   BP:  Sitting: 140/61      SaO2: 97 RA   Pt resting in bed feeling well today.  Ambulated in hallway using front wheel walker. Moderate assist to stand, moving at slow steady pace. Tolerated well with mild right leg pain, no dizziness or SOB. Returned to chair with call bell and water, tissues and phone within reach. Post OP TAVR education completed yesterday. No questions or concerns.   [phone] Woodroe Chen, RN BSN 06/23/2023 10:25 AM

## 2023-06-23 NOTE — Progress Notes (Addendum)
Mobility Specialist: Progress Note   06/23/23 1657  Mobility  Activity Ambulated with assistance in room  Level of Assistance Minimal assist, patient does 75% or more (chair follow)  Assistive Device Front wheel walker  Distance Ambulated (ft) 8 ft  Activity Response Tolerated well  Mobility Referral Yes  $Mobility charge 1 Mobility  Mobility Specialist Start Time (ACUTE ONLY) 1630  Mobility Specialist Stop Time (ACUTE ONLY) 1658  Mobility Specialist Time Calculation (min) (ACUTE ONLY) 28 min   Pre-Mobility: HR 70, BP 116/72  During Mobility: HR 74-75 Post-Mobility: HR 74, SpO2 92% RA  Pt was agreeable to mobility session - received in bed. MinA for STS, CG for ambulation. Pt c/o soreness in groin and feeling slightly lightheaded during session. Pt unable to ambulate back to bed; wheeled back in chair. Pt left in bed with all needs met, call bell in reach.   [name] Mobility Specialist Please contact via [employer] or Rehab office at [phone]

## 2023-06-24 DIAGNOSIS — Z952 Presence of prosthetic heart valve: Secondary | ICD-10-CM | POA: Diagnosis not present

## 2023-06-24 MED ORDER — SENNA 8.6 MG PO TABS
1.0000 | ORAL_TABLET | Freq: Every day | ORAL | Status: DC | PRN
  Administered 2023-06-25: 8.6 mg via ORAL
  Filled 2023-06-24: qty 1

## 2023-06-24 MED ORDER — LOSARTAN POTASSIUM 50 MG PO TABS
50.0000 mg | ORAL_TABLET | Freq: Every day | ORAL | Status: DC
  Administered 2023-06-24 – 2023-06-27 (×4): 50 mg via ORAL
  Filled 2023-06-24 (×4): qty 1

## 2023-06-24 MED ORDER — SENNOSIDES-DOCUSATE SODIUM 8.6-50 MG PO TABS
1.0000 | ORAL_TABLET | Freq: Every day | ORAL | Status: DC
  Administered 2023-06-24 – 2023-06-26 (×3): 1 via ORAL
  Filled 2023-06-24 (×3): qty 1

## 2023-06-24 NOTE — Progress Notes (Signed)
CARDIAC REHAB PHASE I    Pt has been seen today by PT/OT and mobility team. Has not been able to ambulate in hallway.  Awaiting SNF/rehab placement.      Woodroe Chen, RN BSN 06/24/2023 1:33 PM

## 2023-06-24 NOTE — Care Management Important Message (Signed)
Important Message  Patient Details  Name: [name] MRN: [id number] Date of Birth: [birth year]   Medicare Important Message Given:  Yes     Renie Ora 06/24/2023, 8:28 AM

## 2023-06-24 NOTE — Progress Notes (Signed)
Mobility Specialist Progress Note:   06/24/23 1633  Mobility  Activity Ambulated with assistance in hallway  Level of Assistance Minimal assist, patient does 75% or more  Assistive Device Front wheel walker  Distance Ambulated (ft) 40 ft  Activity Response Tolerated well  Mobility Referral Yes  $Mobility charge 1 Mobility  Mobility Specialist Start Time (ACUTE ONLY) 1615  Mobility Specialist Stop Time (ACUTE ONLY) 1630  Mobility Specialist Time Calculation (min) (ACUTE ONLY) 15 min    Pt received in bed with NT, agreeable to mobility. MinA to stand. CG during ambulation. Standing rest break required d/t fatigue. Pt c/o pain in R groin during ambulation otherwise asymptomatic throughout. Pt returned to bed with NT present in room.   Leory Plowman  Mobility Specialist Please contact via [employer] office at [phone]

## 2023-06-24 NOTE — Evaluation (Signed)
Occupational Therapy Evaluation Patient Details Name: [name] MRN: [id number] DOB: 1941-04-26 Today's Date: 06/24/2023   History of Present Illness Pt is an 82 y.o. female admitted 06/21/23 for planned same day TAVR. Pt also with R common femoral artery occlusion s/p R femoral endarterectomy, thrombectomy 8/7. PMH includes HTN, HLD, OA, breast CA (1991), cataract, depression, anxiety.   Clinical Impression   Pt is typically independent, drives and lives on her own. She presents with groin pain, generalized weakness and impaired standing balance. She stands with heavy use of UEs from chair and BSC with min assist and transfers with CGA with RW. She requires set up to max assist for ADLs. Patient will benefit from continued inpatient follow up therapy, <3 hours/day.       If plan is discharge home, recommend the following: A little help with walking and/or transfers;A lot of help with bathing/dressing/bathroom;Assistance with cooking/housework;Help with stairs or ramp for entrance;Supervision due to cognitive status;Assist for transportation    Functional Status Assessment  Patient has had a recent decline in their functional status and demonstrates the ability to make significant improvements in function in a reasonable and predictable amount of time.  Equipment Recommendations  Other (comment) (defer to next venue)    Recommendations for Other Services       Precautions / Restrictions Precautions Precautions: Fall Restrictions Weight Bearing Restrictions: No      Mobility Bed Mobility               General bed mobility comments: received in chair    Transfers Overall transfer level: Needs assistance Equipment used: Rolling walker (2 wheels) Transfers: Sit to/from Stand, Bed to chair/wheelchair/BSC Sit to Stand: Min assist     Step pivot transfers: Contact guard assist     General transfer comment: assist to rise to steady, increased time, cues for hand  placement      Balance Overall balance assessment: Needs assistance         Standing balance support: Reliant on assistive device for balance Standing balance-Leahy Scale: Poor                             ADL either performed or assessed with clinical judgement   ADL Overall ADL's : Needs assistance/impaired Eating/Feeding: Independent;Sitting   Grooming: Set up;Sitting;Wash/dry hands   Upper Body Bathing: Set up;Sitting   Lower Body Bathing: Maximal assistance;Sit to/from stand   Upper Body Dressing : Set up;Sitting   Lower Body Dressing: Maximal assistance;Sit to/from stand   Toilet Transfer: Contact guard assist;Ambulation;Rolling walker (2 wheels);BSC/3in1   Toileting- Clothing Manipulation and Hygiene: Minimal assistance;Sit to/from stand               Vision Baseline Vision/History: 1 Wears glasses Ability to See in Adequate Light: 0 Adequate Patient Visual Report: No change from baseline       Perception         Praxis         Pertinent Vitals/Pain Pain Assessment Pain Assessment: Faces Faces Pain Scale: Hurts even more Pain Location: RLE, especially at groin incision Pain Descriptors / Indicators: Discomfort, Grimacing, Guarding Pain Intervention(s): Monitored during session, Repositioned     Extremity/Trunk Assessment Upper Extremity Assessment Upper Extremity Assessment: Overall WFL for tasks assessed   Lower Extremity Assessment Lower Extremity Assessment: Defer to PT evaluation RLE Deficits / Details: s/p RLE revascularization; functional strength with expected post-op pain and weakness RLE Coordination: decreased gross motor;decreased  fine motor   Cervical / Trunk Assessment Cervical / Trunk Assessment: Kyphotic   Communication Communication Communication: No apparent difficulties   Cognition Arousal: Alert Behavior During Therapy: WFL for tasks assessed/performed, Flat affect Overall Cognitive Status: Within  Functional Limits for tasks assessed                                       General Comments  sitting BP 136/65 (84), HR 78; standing BP 118/53 (72) with pt c/o lightheadedness; post-transfer SBP 120s. educ on POC and discharge recommendations, pt hopeful for SNF rehab to regain indep before return home; she has been in the past but cannot remember facility name    Exercises     Shoulder Instructions      Home Living Family/patient expects to be discharged to:: Private residence Living Arrangements: Alone Available Help at Discharge: Family;Friend(s);Available PRN/intermittently Type of Home: House Home Access: Stairs to enter [employer] of Steps: 7 Entrance Stairs-Rails: Right Home Layout: One level     Bathroom Shower/Tub: Chief Strategy Officer: Standard     Home Equipment: None   Additional Comments: children live nearby but she is unable to stay with them      Prior Functioning/Environment Prior Level of Function : Independent/Modified Independent;Driving             Mobility Comments: independent without DME. active volunteering with church          OT Problem List: Decreased strength;Impaired balance (sitting and/or standing);Pain;Decreased knowledge of use of DME or AE      OT Treatment/Interventions: Self-care/ADL training;DME and/or AE instruction;Therapeutic activities;Balance training;Patient/family education    OT Goals(Current goals can be found in the care plan section) Acute Rehab OT Goals OT Goal Formulation: With patient Time For Goal Achievement: 07/08/23 Potential to Achieve Goals: Good ADL Goals Pt Will Perform Grooming: with supervision;standing Pt Will Perform Lower Body Bathing: with min assist;sit to/from stand Pt Will Perform Lower Body Dressing: with min assist;sit to/from stand Pt Will Transfer to Toilet: with supervision;ambulating;bedside commode Pt Will Perform Toileting - Clothing  Manipulation and hygiene: with supervision;sit to/from stand Additional ADL Goal #1: Pt will perform bed mobility with supervision in preparation for ADLs.  OT Frequency: Min 1X/week    Co-evaluation              AM-PAC OT "6 Clicks" Daily Activity     Outcome Measure Help from another person eating meals?: None Help from another person taking care of personal grooming?: A Little Help from another person toileting, which includes using toliet, bedpan, or urinal?: A Little Help from another person bathing (including washing, rinsing, drying)?: A Lot Help from another person to put on and taking off regular upper body clothing?: A Little Help from another person to put on and taking off regular lower body clothing?: A Lot 6 Click Score: 17   End of Session Equipment Utilized During Treatment: Gait belt;Rolling walker (2 wheels)  Activity Tolerance: Patient tolerated treatment well Patient left: in chair;with call bell/phone within reach;with chair alarm set  OT Visit Diagnosis: Unsteadiness on feet (R26.81);Other abnormalities of gait and mobility (R26.89);Pain                Time: 1115-1140 OT Time Calculation (min): 25 min Charges:  OT General Charges $OT Visit: 1 Visit OT Evaluation $OT Eval Moderate Complexity: 1 Mod OT Treatments $Self Care/Home Management :  8-22 mins  Berna Spare, OTR/L Acute Rehabilitation Services Office: [phone]   Evern Bio 06/24/2023, 11:53 AM

## 2023-06-24 NOTE — TOC Initial Note (Addendum)
Transition of Care [facility]) - Initial/Assessment Note    Patient Details  Name: [name] MRN: [id number] Date of Birth: 1941/08/13  [facility]) CM/SW Contact:    Deatra Robinson, [place] Phone Number: 06/24/2023, 10:29 AM  Clinical Narrative: spoke to pt re PT recommendation for SNF. Pt verbalzed understanding of SNF rec and reports agreeable. Reviewed SNF placement process and answered questions. Pt requesting [facility]. Will f/u with offers as available.   UPDATE 1120: bed offer received from [facility], pt has accepted. [facility] does not have a bed today and they do not anticipate availability until Monday but will notify SW if status changes today or over weekend. Cardiology APP updated.   Dellie Burns, MSW, LCSW [phone] (coverage)                    Expected Discharge Plan: Skilled Nursing Facility Barriers to Discharge: Continued Medical Work up, SNF Pending bed offer   Patient Goals and CMS Choice   CMS Medicare.gov Compare Post Acute Care list provided to:: Patient Choice offered to / list presented to : Patient  ownership interest in [facility]provided to:: Patient    Expected Discharge Plan and Services                                              Prior Living Arrangements/Services     Patient language and need for interpreter reviewed:: No        Need for Family Participation in Patient Care: Yes (Comment) Care giver support system in place?: No (comment)   Criminal Activity/Legal Involvement Pertinent to Current Situation/Hospitalization: No - Comment as needed  Activities of Daily Living Home Assistive Devices/Equipment: Eyeglasses, Cane (specify quad or straight), Blood pressure cuff ADL Screening (condition at time of admission) Patient's cognitive ability adequate to safely complete daily activities?: Yes Is the patient deaf or have difficulty hearing?: Yes (bilateral hearing aids) Does  the patient have difficulty seeing, even when wearing glasses/contacts?: No Does the patient have difficulty concentrating, remembering, or making decisions?: Yes Patient able to express need for assistance with ADLs?: Yes Does the patient have difficulty dressing or bathing?: No Independently performs ADLs?: Yes (appropriate for developmental age) Does the patient have difficulty walking or climbing stairs?: Yes Weakness of Legs: Both Weakness of Arms/Hands: None  Permission Sought/Granted Permission sought to share information with : Facility Industrial/product designer granted to share information with : Yes, Verbal Permission Granted              Emotional Assessment       Orientation: : Oriented to Self, Oriented to Place, Oriented to  Time, Oriented to Situation Alcohol / Substance Use: Not Applicable Psych Involvement: No (comment)  Admission diagnosis:  S/P TAVR (transcatheter aortic valve replacement) [Z95.2] Patient Active Problem List   Diagnosis Date Noted   S/P TAVR (transcatheter aortic valve replacement) 06/21/2023   CAD (coronary artery disease)    History of CVA (cerebrovascular accident) 10/27/2022   Bilateral sensorineural hearing loss 09/22/2022   Chronic diastolic heart failure (HCC) 08/04/2022   Prediabetes 08/04/2022   Acute left-sided low back pain without sciatica 07/30/2020   Cavus deformity of foot 05/01/2020   Poor balance 05/10/2018   Left ventricular diastolic dysfunction 07/27/2017   Essential hypertension 07/27/2017   Fall 03/02/2017   Urinary incontinence 02/16/2017   Chronic  venous insufficiency 08/20/2016   Xiphoid pain 03/05/2016   Insomnia 01/16/2016   Anxiety and depression 12/24/2015   Asthma, mild intermittent, well-controlled 11/24/2015   Carotid artery disease (HCC) 06/29/2011   Hypothyroidism 11/11/2010   Hyperlipidemia 11/11/2010   Senile osteoporosis 11/11/2010   RHINOSINUSITIS, RECURRENT 10/29/2008   Personal  history of malignant neoplasm of breast 03/27/2008   GERD (gastroesophageal reflux disease) 03/27/2008   DIVERTICULOSIS 03/27/2008   Atherosclerosis of native coronary artery of native heart with angina pectoris (HCC) 08/23/2007   Severe aortic stenosis 08/23/2007   DVT 08/23/2007   PCP:  Pincus Sanes, MD Pharmacy:   [id number] - [place],  - [address] AT [address] OF [facility] [address] [place] [address] Phone: [phone] Fax: [phone]  [facility]) - [place], [place] - [address] [facility] [address] [place] [address] [place] [address] Phone: [phone] Fax: [phone]     Social Determinants of Health (SDOH) Social History: SDOH Screenings   Food Insecurity: No Food Insecurity (09/02/2022)  Housing: Low Risk  (09/02/2022)  Transportation Needs: No Transportation Needs (09/02/2022)  Utilities: Not At Risk (09/02/2022)  Alcohol Screen: Low Risk  (09/02/2022)  Depression (PHQ2-9): Low Risk  (02/02/2023)  Financial Resource Strain: Low Risk  (09/02/2022)  Physical Activity: Inactive (09/02/2022)  Social Connections: Moderately Integrated (09/02/2022)  Stress: No Stress Concern Present (09/02/2022)  Tobacco Use: Low Risk  (06/22/2023)   SDOH Interventions:     Readmission Risk Interventions     No data to display

## 2023-06-24 NOTE — Progress Notes (Addendum)
  Progress Note    06/24/2023 7:31 AM 2 Days Post-Op  Subjective:  sitting up eating breakfast.  Feels weak today   Vitals:   06/23/23 2327 06/24/23 0315  BP: (!) 135/56 (!) 126/45  Pulse: 77 79  Resp: 20 16  Temp: 98.3 F (36.8 C) 98.4 F (36.9 C)  SpO2: 96% 96%   Physical Exam: Lungs:  non labored Incisions:  R groin c/d/i Extremities:  palpable R PT pulse Abdomen:  soft Neuro: A&O  CBC    Component Value Date/Time   WBC 10.2 06/23/2023 2349   RBC 3.89 06/23/2023 2349   HGB 11.6 (L) 06/23/2023 2349   HGB 12.6 07/14/2022 1552   HCT 35.5 (L) 06/23/2023 2349   HCT 37.2 07/14/2022 1552   PLT 173 06/23/2023 2349   PLT 280 07/14/2022 1552   MCV 91.3 06/23/2023 2349   MCV 88 07/14/2022 1552   MCH 29.8 06/23/2023 2349   MCHC 32.7 06/23/2023 2349   RDW 14.1 06/23/2023 2349   RDW 13.7 07/14/2022 1552   LYMPHSABS 1.4 06/02/2023 0943   MONOABS 0.5 06/02/2023 0943   EOSABS 0.3 06/02/2023 0943   BASOSABS 0.0 06/02/2023 0943    BMET    Component Value Date/Time   NA 130 (L) 06/23/2023 2349   NA 138 02/28/2023 1518   K 3.8 06/23/2023 2349   CL 95 (L) 06/23/2023 2349   CO2 28 06/23/2023 2349   GLUCOSE 93 06/23/2023 2349   BUN 18 06/23/2023 2349   BUN 15 02/28/2023 1518   CREATININE 0.89 06/23/2023 2349   CREATININE 0.68 07/30/2020 1631   CALCIUM 8.4 (L) 06/23/2023 2349   GFRNONAA >60 06/23/2023 2349   GFRNONAA 82 07/30/2020 1631   GFRAA 95 07/30/2020 1631    INR    Component Value Date/Time   INR 1.1 06/17/2023 1057     Intake/Output Summary (Last 24 hours) at 06/24/2023 0731 Last data filed at 06/24/2023 7829 Gross per 24 hour  Intake 600 ml  Output 1450 ml  Net -850 ml     Assessment/Plan:  82 y.o. female is s/p Right femoral endarterectomy with bovine pericardial patch angioplasty #2: Right femoral thrombectomy  2 Days Post-Op   R groin incision well appearing, no drainage or large fluid collection R foot well perfused with palpable PT  pulse PT/OT eval pending; patient would prefer rehab placement prior to going home   Emilie Rutter, PA-C [facility] [phone] 06/24/2023 7:31 AM  VASCULAR STAFF ADDENDUM: I have independently interviewed and examined the patient. I agree with the above.  Palpable pulses in the feet bilaterally, right groin soft, calf soft.  Fara Olden, MD [facility] Phone Number: [phone] 06/24/2023 3:21 PM

## 2023-06-24 NOTE — Progress Notes (Signed)
Pt refued jandiance po. Stated that she was no longer on  it .   Lawson Radar, RN

## 2023-06-24 NOTE — Progress Notes (Addendum)
[facility]   MULTIDISCIPLINARY HEART VALVE TEAM  Patient Name: [name] Date of Encounter: 06/24/2023  Admit date: 06/21/2023  Primary Care Provider: Pincus Sanes, MD [facility] Cardiologist: Tonny Bollman, MD  [facility] Electrophysiologist:  None   Hospital Problem List     Principal Problem:   S/P TAVR (transcatheter aortic valve replacement) Active Problems:   Personal history of malignant neoplasm of breast   Severe aortic stenosis   GERD (gastroesophageal reflux disease)   Hypothyroidism   Hyperlipidemia   Carotid artery disease (HCC)   Chronic venous insufficiency   Essential hypertension   Chronic diastolic heart failure (HCC)   History of CVA (cerebrovascular accident)   CAD (coronary artery disease)     Subjective   Just feels weak all over and exhausted. [facility]  Inpatient Medications    Scheduled Meds:  aspirin EC  81 mg Oral Daily   atorvastatin  20 mg Oral Daily   citalopram  20 mg Oral Daily   empagliflozin  10 mg Oral QAC breakfast   famotidine  40 mg Oral Daily   levothyroxine  75 mcg Oral Q0600   losartan  50 mg Oral Daily   metoprolol tartrate  25 mg Oral BID   senna-docusate  1 tablet Oral Daily   sodium chloride flush  3 mL Intravenous Q12H   Continuous Infusions:  sodium chloride     nitroGLYCERIN     PRN Meds: sodium chloride, acetaminophen **OR** acetaminophen, LORazepam, ondansetron (ZOFRAN) IV, oxyCODONE, polyethylene glycol, sodium chloride flush, zolpidem   Vital Signs    Vitals:   06/23/23 1944 06/23/23 2327 06/24/23 0315 06/24/23 0742  BP: 120/69 (!) 135/56 (!) 126/45 (!) 140/54  Pulse: 76 77 79 82  Resp: 17 20 16 20   Temp: 97.6 F (36.4 C) 98.3 F (36.8 C) 98.4 F (36.9 C) 97.9 F (36.6 C)  TempSrc: Oral Oral Oral Oral  SpO2: 96% 96% 96% 97%  Weight:   55.7 kg   Height:        Intake/Output Summary (Last 24 hours) at 06/24/2023 0833 Last data filed at 06/24/2023  0643 Gross per 24 hour  Intake 360 ml  Output 1450 ml  Net -1090 ml   Filed Weights   06/22/23 0506 06/23/23 0702 06/24/23 0315  Weight: 58.8 kg 58.7 kg 55.7 kg    Physical Exam    GEN: Well nourished, well developed, in no acute distress.  HEENT: Grossly normal.  Neck: Supple, no JVD, carotid bruits, or masses. Cardiac: RRR, 3/6 sysolic murmur. No rubs, or gallops. No clubbing, cyanosis, edema.   Respiratory:  Respirations regular and unlabored, clear to auscultation bilaterally. GI: Soft, nontender, nondistended, BS + x 4. MS: no deformity or atrophy. Skin: warm and dry, no rash.  Groin sites clear without hematoma or ecchymosis. Right leg warm with good pulses Neuro:  Strength and sensation are intact. Psych: AAOx3.  Normal affect.  Labs    CBC Recent Labs    06/22/23 2332 06/23/23 2349  WBC 9.8 10.2  HGB 11.7* 11.6*  HCT 35.2* 35.5*  MCV 91.2 91.3  PLT 182 173   Basic Metabolic Panel Recent Labs    16/10/96 2357 06/22/23 1600 06/22/23 2332 06/23/23 2349  NA 129*   < > 131* 130*  K 3.8   < > 3.8 3.8  CL 99  --  100 95*  CO2 24  --  23 28  GLUCOSE  116*  --  136* 93  BUN 9  --  11 18  CREATININE 0.66  --  0.74 0.89  CALCIUM 8.2*  --  8.5* 8.4*  MG 1.9  --   --   --    < > = values in this interval not displayed.   Liver Function Tests No results for input(s): "AST", "ALT", "ALKPHOS", "BILITOT", "PROT", "ALBUMIN" in the last 72 hours. No results for input(s): "LIPASE", "AMYLASE" in the last 72 hours. Cardiac Enzymes No results for input(s): "CKTOTAL", "CKMB", "CKMBINDEX", "TROPONINI" in the last 72 hours. BNP Invalid input(s): "POCBNP" D-Dimer No results for input(s): "DDIMER" in the last 72 hours. Hemoglobin A1C No results for input(s): "HGBA1C" in the last 72 hours. Fasting Lipid Panel No results for input(s): "CHOL", "HDL", "LDLCALC", "TRIG", "CHOLHDL", "LDLDIRECT" in the last 72 hours. Thyroid Function Tests No results for input(s): "TSH",  "T4TOTAL", "T3FREE", "THYROIDAB" in the last 72 hours.  Invalid input(s): "FREET3"  Telemetry    Sinus - Personally Reviewed  ECG    Sinus, HR 73  - Personally Reviewed  Radiology    VAS [place] LOWER EXTREMITY ARTERIAL DUPLEX  Result Date: 06/22/2023 LOWER EXTREMITY ARTERIAL DUPLEX STUDY Patient Name:  [name]  Date of Exam:   06/22/2023 Medical Rec #: [id number]      Accession #:    [id number] Date of Birth: Apr 15, 1941       Patient Gender: F Patient Age:   33 years Exam Location:  [facility] Procedure:      VAS [place] LOWER EXTREMITY ARTERIAL DUPLEX Referring Phys: Samara Deist THOMPSON --------------------------------------------------------------------------------  Indications: Peripheral artery disease, and Numbness and tingling. High Risk Factors: Hypertension, hyperlipidemia, coronary artery disease.  Vascular Interventions: Transcatheter Aortic Valve Replacement 06/21/23. Current ABI:            N/A Comparison Study: No prior study Performing Technologist: Shona Simpson  Examination Guidelines: A complete evaluation includes B-mode imaging, spectral Doppler, color Doppler, and power Doppler as needed of all accessible portions of each vessel. Bilateral testing is considered an integral part of a complete examination. Limited examinations for reoccurring indications may be performed as noted.  +-----------+--------+-----+--------+----------+--------+ RIGHT      PSV cm/sRatioStenosisWaveform  Comments +-----------+--------+-----+--------+----------+--------+ EIA Distal 50                   monophasic         +-----------+--------+-----+--------+----------+--------+ CFA Distal 0            occluded                   +-----------+--------+-----+--------+----------+--------+ DFA        28                   monophasic         +-----------+--------+-----+--------+----------+--------+ SFA Prox   20                   monophasic          +-----------+--------+-----+--------+----------+--------+ SFA Mid    13                   monophasic         +-----------+--------+-----+--------+----------+--------+ SFA Distal 11                   monophasic         +-----------+--------+-----+--------+----------+--------+ POP Prox   11  monophasic         +-----------+--------+-----+--------+----------+--------+ POP Distal 12                   monophasic         +-----------+--------+-----+--------+----------+--------+ ATA Distal 3                    monophasic         +-----------+--------+-----+--------+----------+--------+ PTA Distal 5                    monophasic         +-----------+--------+-----+--------+----------+--------+ PERO Distal4                    monophasic         +-----------+--------+-----+--------+----------+--------+  +----------+--------+-----+--------+---------+--------+ LEFT      PSV cm/sRatioStenosisWaveform Comments +----------+--------+-----+--------+---------+--------+ CFA [id number]                  triphasic         +----------+--------+-----+--------+---------+--------+  Summary: Right: Total occlusion noted in the common femoral artery. Arterial study of the right leg shows and occluded CFA and String flow in the EIA. There is a collateral pathway connecting the limited ECA flow to the Profunda Artery, resulting in monophasic flow throughout the right leg.  See table(s) above for measurements and observations. Electronically signed by Coral Else MD on 06/22/2023 at 5:46:43 PM.    Final    PERIPHERAL VASCULAR CATHETERIZATION  Result Date: 06/22/2023 See surgical note for result.  ECHOCARDIOGRAM COMPLETE  Result Date: 06/22/2023    ECHOCARDIOGRAM REPORT   Patient Name:   [name] Date of Exam: 06/22/2023 Medical Rec #:  [id number]     Height:       62.0 in Accession #:    [id number]    Weight:       129.6 lb Date of Birth:  Oct 23, 1941      BSA:           1.590 m Patient Age:    82 years      BP:           102/52 mmHg Patient Gender: F             HR:           80 bpm. Exam Location:  Inpatient Procedure: 2D Echo, Color Doppler and Cardiac Doppler Indications:    Post TAVR z95.2  History:        Patient has prior history of Echocardiogram examinations, most                 recent 06/21/2023. CAD; Risk Factors:Hypertension and                 Dyslipidemia. 20mm Edwards S3U TAVR implanted 06/21/23.                  Aortic Valve: 20 mm Edwards Ultra, stented (TAVR) valve is                 present in the aortic position. Procedure Date: 06/21/2023.  Sonographer:    Irving Burton Senior RDCS Referring Phys: [id number] Janetta Hora  Sonographer Comments: Very difficult windows due to thin body habitus and implants. IMPRESSIONS  1. Left ventricular ejection fraction, by estimation, is 65 to 70%. The left ventricle has normal function. The left ventricle has no regional wall motion abnormalities. Left ventricular diastolic parameters are consistent with Grade II diastolic dysfunction (pseudonormalization).  Elevated left atrial pressure.  2. There appears to be a prominent muscular ridge (extension of the moderator band) in the mid right ventricle, separating the inflow chanber from the infundibulum and causing flow acceleration with a "gradient" of approximately 1.6 m/s. This was present on the pre-procedural images performed before TAVR deployment. The accelerated flow mimics the flow of a membranoud VSD, but the velocity is low and there is no evidence of increased flow across the RVOT. There is no evidence of VSD. Right ventricular systolic function is hyperdynamic. The right ventricular size is normal. Tricuspid regurgitation signal is inadequate for assessing PA pressure.  3. Left atrial size was moderately dilated.  4. The mitral valve is normal in structure. Moderate mitral valve regurgitation. Moderate mitral annular calcification.  5. The aortic valve has been  repaired/replaced. Aortic valve regurgitation is not visualized. No aortic stenosis is present. There is a 20 mm Edwards Ultra, stented (TAVR) valve present in the aortic position. Procedure Date: 06/21/2023. Echo findings are  consistent with normal structure and function of the aortic valve prosthesis. Aortic valve mean gradient measures 7.0 mmHg. Aortic valve Vmax measures 1.66 m/s. Aortic valve acceleration time measures 108 msec. FINDINGS  Left Ventricle: Left ventricular ejection fraction, by estimation, is 65 to 70%. The left ventricle has normal function. The left ventricle has no regional wall motion abnormalities. The left ventricular internal cavity size was normal in size. There is  borderline concentric left ventricular hypertrophy. Left ventricular diastolic parameters are consistent with Grade II diastolic dysfunction (pseudonormalization). Elevated left atrial pressure. Right Ventricle: There appears to be a prominent muscular ridge (extension of the moderator band) in the mid right ventricle, separating the inflow chanber from the infundibulum and causing flow acceleration with a "gradient" of approximately 1.6 m/s. This was present on the pre-procedural images performed before TAVR deployment. The accelerated flow mimics the flow of a membranoud VSD, but the velocity is low and there is no evidence of increased flow across the RVOT. There is no evidence of VSD. The  right ventricular size is normal. No increase in right ventricular wall thickness. Right ventricular systolic function is hyperdynamic. Tricuspid regurgitation signal is inadequate for assessing PA pressure. Left Atrium: Left atrial size was moderately dilated. Right Atrium: Right atrial size was normal in size. Pericardium: There is no evidence of pericardial effusion. Mitral Valve: The mitral valve is normal in structure. There is mild thickening of the mitral valve leaflet(s). There is mild calcification of the mitral valve leaflet(s).  Moderate mitral annular calcification. Moderate mitral valve regurgitation. MV peak gradient, 6.6 mmHg. The mean mitral valve gradient is 3.0 mmHg. Tricuspid Valve: The tricuspid valve is normal in structure. Tricuspid valve regurgitation is not demonstrated. Aortic Valve: The aortic valve has been repaired/replaced. Aortic valve regurgitation is not visualized. No aortic stenosis is present. Aortic valve mean gradient measures 7.0 mmHg. Aortic valve peak gradient measures 11.0 mmHg. Aortic valve area, by VTI  measures 2.17 cm. There is a 20 mm Edwards Ultra, stented (TAVR) valve present in the aortic position. Procedure Date: 06/21/2023. Echo findings are consistent with normal structure and function of the aortic valve prosthesis. Pulmonic Valve: The pulmonic valve was grossly normal. Pulmonic valve regurgitation is mild to moderate. Aorta: The aortic root is normal in size and structure. IAS/Shunts: No atrial level shunt detected by color flow Doppler. No ventricular septal defect is seen or detected.  LEFT VENTRICLE PLAX 2D LVIDd:         2.90 cm  Diastology LVIDs:         2.00 cm   LV e' medial:    5.22 cm/s LV PW:         1.00 cm   LV E/e' medial:  24.3 LV IVS:        1.20 cm   LV e' lateral:   6.20 cm/s LVOT diam:     1.90 cm   LV E/e' lateral: 20.5 LV SV:         65 LV SV Index:   41 LVOT Area:     2.84 cm  RIGHT VENTRICLE RV S prime:     10.20 cm/s TAPSE (M-mode): 2.1 cm LEFT ATRIUM           Index        RIGHT ATRIUM           Index LA diam:      3.30 cm 2.08 cm/m   RA Area:     19.40 cm LA Vol (A4C): 54.4 ml 34.22 ml/m  RA Volume:   54.90 ml  34.53 ml/m  AORTIC VALVE AV Area (Vmax):    1.95 cm AV Area (Vmean):   1.90 cm AV Area (VTI):     2.17 cm AV Vmax:           166.00 cm/s AV Vmean:          133.000 cm/s AV VTI:            0.299 m AV Peak Grad:      11.0 mmHg AV Mean Grad:      7.0 mmHg LVOT Vmax:         114.00 cm/s LVOT Vmean:        89.100 cm/s LVOT VTI:          0.229 m LVOT/AV VTI ratio:  0.77  AORTA Ao Root diam: 3.20 cm Ao Asc diam:  3.30 cm MITRAL VALVE MV Area (PHT): 1.93 cm     SHUNTS MV Area VTI:   1.75 cm     Systemic VTI:  0.23 m MV Peak grad:  6.6 mmHg     Systemic Diam: 1.90 cm MV Mean grad:  3.0 mmHg MV Vmax:       1.28 m/s MV Vmean:      86.1 cm/s MV Decel Time: 393 msec MV E velocity: 127.00 cm/s MV A velocity: 96.40 cm/s MV E/A ratio:  1.32 Mihai Croitoru MD Electronically signed by Thurmon Fair MD Signature Date/Time: 06/22/2023/12:38:52 PM    Final     Cardiac Studies   [facility]  TAVR OPERATIVE NOTE     Date of Procedure:                06/21/2023   Preoperative Diagnosis:      Severe Aortic Stenosis    Postoperative Diagnosis:    Same    Procedure:        Transcatheter Aortic Valve Replacement - Transfemoral Approach             Edwards Sapien 3 THV (size 20 mm, model # [id number] , serial # [id number] )              Co-Surgeons:                        Verne Carrow, MD and Eugenio Hoes , MD    Anesthesiologist:  Ellender   Echocardiographer:              Croitoru   Pre-operative Echo Findings: Severe aortic stenosis Normal left ventricular systolic function   Post-operative Echo Findings: Trivial paravalvular leak Normal left ventricular systolic function   _____________     Echo 06/22/23:  IMPRESSIONS   1. Left ventricular ejection fraction, by estimation, is 65 to 70%. The  left ventricle has normal function. The left ventricle has no regional  wall motion abnormalities. Left ventricular diastolic parameters are  consistent with Grade II diastolic  dysfunction (pseudonormalization). Elevated left atrial pressure.   2. There appears to be a prominent muscular ridge (extension of the  moderator band) in the mid right ventricle, separating the inflow chanber  from the infundibulum and causing flow acceleration with a "gradient" of  approximately 1.6 m/s. This was  present on the pre-procedural images  performed before TAVR deployment. The  accelerated flow mimics the flow of a membranoud VSD, but the velocity is  low and there is no evidence of increased flow across the RVOT. There is  no evidence of VSD. Right  ventricular systolic function is hyperdynamic. The right ventricular size  is normal. Tricuspid regurgitation signal is inadequate for assessing PA  pressure.   3. Left atrial size was moderately dilated.   4. The mitral valve is normal in structure. Moderate mitral valve  regurgitation. Moderate mitral annular calcification.   5. The aortic valve has been repaired/replaced. Aortic valve  regurgitation is not visualized. No aortic stenosis is present. There is a  20 mm Edwards Ultra, stented (TAVR) valve present in the aortic position.  Procedure Date: 06/21/2023. Echo findings are   consistent with normal structure and function of the aortic valve  prosthesis. Aortic valve mean gradient measures 7.0 mmHg. Aortic valve  Vmax measures 1.66 m/s. Aortic valve acceleration time measures 108 msec.   ____________________  VAS [place] LE 06/22/23 Summary:  Right: Total occlusion noted in the common femoral artery. Arterial study  of the right leg shows and occluded CFA and String flow in the EIA. There  is a collateral pathway connecting the limited ECA flow to the Profunda  Artery, resulting in monophasic  flow throughout the right leg    ____________________  06/22/2023 Pre-operative Diagnosis: Ischemic right leg Post-operative diagnosis:  Same Surgeon:  Durene Cal Assistants:  Antony Blackbird, PA Procedure:   #1: Right femoral endarterectomy with bovine pericardial patch angioplasty                       #2: Right femoral thrombectomy Anesthesia:  general  Patient Profile     [name] is a 82 y.o. female with a history of CAD s/p CABG x5 2007, HTN, HLD, hypothyroidism, chronic diastolic HF, breast cancer s/p mastectomy, and aortic stenosis who presented to [facility] on 06/21/23 for  planned TAVR.   Assessment & Plan    Severe AS: s/p successful TAVR with a 20 mm Edwards Sapien 3 Ultra Resilia THV via the TF approach on 06/21/23. Post operative echo showed EF 65%, normally functioning TAVR with a mean gradient of 7 mmHg and no PVL. Groin sites are stable. ECG with sinus and no high grade heart block. Continued on home Asprin.   Ischemic right leg: s/p femoral thrombectomy/endarterectomy with bovine pericardial patch angioplasty. Appreciate vascular.             Hyponatremia: NA 130. Will continue to monitor.   NSVT: noted  on tele 8/7. Resumed on BB.  CAD s/p CABG: pre TAVR cath showed triple vessel CAD s/p 5V CABG with 5/5 patent bypass grafts. Continue medical therapy.   HTN: BP starting to creep up. Added back home BB and ARB.  Chronic diastolic CHF: appears euvolemic. Will resume home Lasix at discharge.   Constipation: reordered home docusate. Wants a stimulant laxative as well  Dispo: [facility]. Will place PT/OT/CM consults   Signed, Cline Crock, PA-C  06/24/2023, 8:33 AM  Pager [phone]  I have personally seen and examined this patient. I agree with the assessment and plan as outlined above.  She is doing well today. Her right leg is warm with good distal pulses. No chest pain or dyspnea. Groins stable.  Will plan to work with PT. Planning for SNF.   Verne Carrow, MD, [facility] 06/24/2023 11:23 AM

## 2023-06-24 NOTE — Evaluation (Signed)
Physical Therapy Evaluation Patient Details Name: [name] MRN: [id number] DOB: 08-23-1941 Today's Date: 06/24/2023  History of Present Illness  Pt is an 82 y.o. female admitted 06/21/23 for planned same day TAVR. Pt also with R common femoral artery occlusion s/p R femoral endarterectomy, thrombectomy 8/7. PMH includes HTN, HLD, OA, breast CA (1991), cataract, depression, anxiety.   Clinical Impression  Pt presents with an overall decrease in functional mobility secondary to above. PTA, pt independent, lives alone, drives, active volunteering with her church. Today, pt requiring min-modA for brief bouts of standing activity with RW. Pt limited by generalized weakness, decreased activity tolerance, impaired balance strategies and post-op pain. Pt would benefit from post-acute rehab services (<3 hrs/day) to maximize functional mobility and independence prior to return home alone.    If plan is discharge home, recommend the following: A little help with walking and/or transfers;A little help with bathing/dressing/bathroom;Assistance with cooking/housework;Assist for transportation;Help with stairs or ramp for entrance   Can travel by private vehicle   Yes    Equipment Recommendations Other (comment) (defer to next venue)  Recommendations for Other Services       Functional Status Assessment Patient has had a recent decline in their functional status and demonstrates the ability to make significant improvements in function in a reasonable and predictable amount of time.     Precautions / Restrictions Precautions Precautions: Fall Restrictions Weight Bearing Restrictions: No      Mobility  Bed Mobility Overal bed mobility: Needs Assistance Bed Mobility: Supine to Sit     Supine to sit: Min assist, HOB elevated, Used rails     General bed mobility comments: significant increased time and effort secondary to pain and fatigue, heavy use of bed rail,; pt partially sitting up then  returning to sidelying 2x before achieving fully upright sitting with minA to pull hips forward at EOB    Transfers Overall transfer level: Needs assistance Equipment used: Rolling walker (2 wheels) Transfers: Sit to/from Stand, Bed to chair/wheelchair/BSC Sit to Stand: Mod assist, Min assist   Step pivot transfers: Contact guard assist       General transfer comment: cues for hand placement, ultimately requiring modA for HHA to elevate trunk standing from EOB to RW, increased time and effort; cues for hand placement with eccentric control to sit, though poor ability to do so with groin incision pain; minA for trunk elevation standing from recliner, heavy reliance on armrests to push to stand. CGA for pivotal steps to recliner, further distance limited by c/o fatigue and lightheadedness    Ambulation/Gait                  Stairs            Wheelchair Mobility     Tilt Bed    Modified Rankin (Stroke Patients Only)       Balance Overall balance assessment: Needs assistance Sitting-balance support: No upper extremity supported, Feet supported Sitting balance-Leahy Scale: Good Sitting balance - Comments: pt able to brush teeth and wash face from seated position; unable to reach feet or don socks sitting EOB (reports she wears slip on shoes at baseline secondary to this)   Standing balance support: Reliant on assistive device for balance Standing balance-Leahy Scale: Poor                               Pertinent Vitals/Pain Pain Assessment Pain Assessment: Faces Faces Pain Scale:  Hurts even more Pain Location: RLE, especially at groin incision Pain Descriptors / Indicators: Discomfort, Grimacing, Guarding Pain Intervention(s): Monitored during session, Limited activity within patient's tolerance    Home Living Family/patient expects to be discharged to:: Private residence Living Arrangements: Alone Available Help at Discharge:  Family;Friend(s);Available PRN/intermittently Type of Home: House Home Access: Stairs to enter Entrance Stairs-Rails: Right Entrance Stairs-Number of Steps: 7   Home Layout: One level Home Equipment: None Additional Comments: children live nearby but she is unable to stay with them    Prior Function Prior Level of Function : Independent/Modified Independent;Driving             Mobility Comments: independent without DME. active volunteering with church       Extremity/Trunk Assessment   Upper Extremity Assessment Upper Extremity Assessment: Generalized weakness    Lower Extremity Assessment Lower Extremity Assessment: Generalized weakness;RLE deficits/detail RLE Deficits / Details: s/p RLE revascularization; functional strength with expected post-op pain and weakness RLE Coordination: decreased gross motor;decreased fine motor    Cervical / Trunk Assessment Cervical / Trunk Assessment: Kyphotic  Communication   Communication Communication: No apparent difficulties  Cognition Arousal: Alert Behavior During Therapy: WFL for tasks assessed/performed, Flat affect Overall Cognitive Status: Within Functional Limits for tasks assessed                                 General Comments: WFL for simple tasks though clearly fatigued        General Comments General comments (skin integrity, edema, etc.): sitting BP 136/65 (84), HR 78; standing BP 118/53 (72) with pt c/o lightheadedness; post-transfer SBP 120s. educ on POC and discharge recommendations, pt hopeful for SNF rehab to regain indep before return home; she has been in the past but cannot remember facility name    Exercises     Assessment/Plan    PT Assessment Patient needs continued PT services  PT Problem List Decreased strength;Decreased range of motion;Decreased activity tolerance;Decreased balance;Decreased mobility;Decreased knowledge of use of DME;Cardiopulmonary status limiting  activity;Pain       PT Treatment Interventions DME instruction;Gait training;Stair training;Functional mobility training;Therapeutic activities;Therapeutic exercise;Balance training;Patient/family education    PT Goals (Current goals can be found in the Care Plan section)  Acute Rehab PT Goals Patient Stated Goal: regain strength and indep at short-term rehab PT Goal Formulation: With patient Time For Goal Achievement: 07/08/23 Potential to Achieve Goals: Good    Frequency Min 1X/week     Co-evaluation               AM-PAC PT "6 Clicks" Mobility  Outcome Measure Help needed turning from your back to your side while in a flat bed without using bedrails?: A Little Help needed moving from lying on your back to sitting on the side of a flat bed without using bedrails?: A Lot Help needed moving to and from a bed to a chair (including a wheelchair)?: A Lot Help needed standing up from a chair using your arms (e.g., wheelchair or bedside chair)?: A Lot Help needed to walk in hospital room?: A Lot Help needed climbing 3-5 steps with a railing? : A Lot 6 Click Score: 13    End of Session Equipment Utilized During Treatment: Gait belt Activity Tolerance: Patient tolerated treatment well;Patient limited by fatigue Patient left: in chair;with call bell/phone within reach Nurse Communication: Mobility status PT Visit Diagnosis: Other abnormalities of gait and mobility (R26.89);Muscle weakness (generalized) (M62.81);Pain  Time: 8119-1478 PT Time Calculation (min) (ACUTE ONLY): 35 min   Charges:   PT Evaluation $PT Eval Moderate Complexity: 1 Mod PT Treatments $Therapeutic Activity: 8-22 mins PT General Charges $$ ACUTE PT VISIT: 1 Visit       Ina Homes, PT, DPT Acute Rehabilitation Services  Personal: Secure Chat Rehab Office: [phone]  Malachy Chamber 06/24/2023, 10:17 AM

## 2023-06-24 NOTE — Progress Notes (Signed)
Mobility Specialist Progress Note:   06/24/23 1205  Mobility  Activity Transferred from chair to bed  Level of Assistance Contact guard assist, steadying assist  Assistive Device Front wheel walker  Distance Ambulated (ft) 3 ft  Activity Response Tolerated well  Mobility Referral Yes  $Mobility charge 1 Mobility  Mobility Specialist Start Time (ACUTE ONLY) 1150  Mobility Specialist Stop Time (ACUTE ONLY) 1204  Mobility Specialist Time Calculation (min) (ACUTE ONLY) 14 min   Post Mobility: 75 HR   Pt received in chair, agreeable to transfer to bed upon NT request. CG to stand and pivot. Pt c/o legs feeling numb from sitting in chair, otherwise asymptomatic throughout.  Pt left in bed with call bell in hand and all needs met.   Leory Plowman  Mobility Specialist Please contact via [employer] office at [phone]

## 2023-06-24 NOTE — [place] FL2 (Signed)
[place] MEDICAID FL2 LEVEL OF CARE FORM     IDENTIFICATION  Patient Name: [name] Birthdate: 12-20-1940 Sex: female Admission Date (Current Location): 06/21/2023  [facility] and [place] Number:  Producer, television/film/video and Address:  [facility] [place], [address], [place], [place] [address]      Provider Number: [id number]  Attending Physician Name and Address:  Kathleene Hazel,*  Relative Name and Phone Number:       Current Level of Care:   Recommended Level of Care: Skilled Nursing Facility Prior Approval Number:    Date Approved/Denied:   PASRR Number: [id number]  Discharge Plan: SNF    Current Diagnoses: Patient Active Problem List   Diagnosis Date Noted   S/P TAVR (transcatheter aortic valve replacement) 06/21/2023   CAD (coronary artery disease)    History of CVA (cerebrovascular accident) 10/27/2022   Bilateral sensorineural hearing loss 09/22/2022   Chronic diastolic heart failure (HCC) 08/04/2022   Prediabetes 08/04/2022   Acute left-sided low back pain without sciatica 07/30/2020   Cavus deformity of foot 05/01/2020   Poor balance 05/10/2018   Left ventricular diastolic dysfunction 07/27/2017   Essential hypertension 07/27/2017   Fall 03/02/2017   Urinary incontinence 02/16/2017   Chronic venous insufficiency 08/20/2016   Xiphoid pain 03/05/2016   Insomnia 01/16/2016   Anxiety and depression 12/24/2015   Asthma, mild intermittent, well-controlled 11/24/2015   Carotid artery disease (HCC) 06/29/2011   Hypothyroidism 11/11/2010   Hyperlipidemia 11/11/2010   Senile osteoporosis 11/11/2010   RHINOSINUSITIS, RECURRENT 10/29/2008   Personal history of malignant neoplasm of breast 03/27/2008   GERD (gastroesophageal reflux disease) 03/27/2008   DIVERTICULOSIS 03/27/2008   Atherosclerosis of native coronary artery of native heart with angina pectoris (HCC) 08/23/2007   Severe aortic stenosis 08/23/2007   DVT 08/23/2007     Orientation RESPIRATION BLADDER Height & Weight     Self, Time, Situation, Place  Normal Continent Weight: 122 lb 12.7 oz (55.7 kg) Height:  5\' 2"  (157.5 cm)  BEHAVIORAL SYMPTOMS/MOOD NEUROLOGICAL BOWEL NUTRITION STATUS      Continent    AMBULATORY STATUS COMMUNICATION OF NEEDS Skin   Limited Assist Verbally Surgical wounds                       Personal Care Assistance Level of Assistance  Bathing, Feeding, Dressing Bathing Assistance: Limited assistance Feeding assistance: Limited assistance Dressing Assistance: Limited assistance     Functional Limitations Info  Sight, Hearing, Speech Sight Info: Adequate Hearing Info: Adequate Speech Info: Adequate    SPECIAL CARE FACTORS FREQUENCY  PT (By licensed PT), OT (By licensed OT)                    Contractures Contractures Info: Not present    Additional Factors Info  Code Status Code Status Info: FULL CODE             Current Medications (06/24/2023):  This is the current hospital active medication list Current Facility-Administered Medications  Medication Dose Route Frequency Provider Last Rate Last Admin   0.9 %  sodium chloride infusion  250 mL Intravenous PRN Schuh, McKenzi P, PA-C       acetaminophen (TYLENOL) tablet 650 mg  650 mg Oral Q6H PRN Schuh, McKenzi P, PA-C       Or   acetaminophen (TYLENOL) suppository 650 mg  650 mg Rectal Q6H PRN Schuh, McKenzi P, PA-C       aspirin  EC tablet 81 mg  81 mg Oral Daily Loel Dubonnet P, PA-C   81 mg at 06/24/23 4098   atorvastatin (LIPITOR) tablet 20 mg  20 mg Oral Daily Schuh, McKenzi P, PA-C   20 mg at 06/24/23 0854   citalopram (CELEXA) tablet 20 mg  20 mg Oral Daily Schuh, McKenzi P, PA-C   20 mg at 06/24/23 0854   empagliflozin (JARDIANCE) tablet 10 mg  10 mg Oral QAC breakfast Cynda [place], McKenzi P, PA-C   10 mg at 06/23/23 0834   famotidine (PEPCID) tablet 40 mg  40 mg Oral Daily Schuh, McKenzi P, PA-C   40 mg at 06/24/23 0855   levothyroxine  (SYNTHROID) tablet 75 mcg  75 mcg Oral Q0600 Loel Dubonnet P, PA-C   75 mcg at 06/24/23 0601   LORazepam (ATIVAN) tablet 1 mg  1 mg Oral Q6H PRN Loel Dubonnet P, PA-C   1 mg at 06/21/23 1706   losartan (COZAAR) tablet 50 mg  50 mg Oral Daily Janetta Hora, PA-C   50 mg at 06/24/23 0854   metoprolol tartrate (LOPRESSOR) tablet 25 mg  25 mg Oral BID Janetta Hora, PA-C   25 mg at 06/24/23 0855   nitroGLYCERIN 50 mg in dextrose 5 % 250 mL (0.2 mg/mL) infusion  0-100 mcg/min Intravenous Titrated Schuh, McKenzi P, PA-C       ondansetron (ZOFRAN) injection 4 mg  4 mg Intravenous Q6H PRN Schuh, McKenzi P, PA-C       oxyCODONE (Oxy IR/ROXICODONE) immediate release tablet 5-10 mg  5-10 mg Oral Q3H PRN Schuh, McKenzi P, PA-C   5 mg at 06/23/23 1400   polyethylene glycol (MIRALAX / GLYCOLAX) packet 17 g  17 g Oral Daily PRN Rachael Fee, MD   17 g at 06/23/23 2206   senna (SENOKOT) tablet 8.6 mg  1 tablet Oral Daily PRN Janetta Hora, PA-C       senna-docusate (Senokot-S) tablet 1 tablet  1 tablet Oral Daily Janetta Hora, PA-C   1 tablet at 06/24/23 0854   sodium chloride flush (NS) 0.9 % injection 3 mL  3 mL Intravenous Q12H Schuh, McKenzi P, PA-C   3 mL at 06/23/23 2131   sodium chloride flush (NS) 0.9 % injection 3 mL  3 mL Intravenous PRN Schuh, McKenzi P, PA-C       zolpidem (AMBIEN) tablet 5 mg  5 mg Oral QHS PRN Schuh, McKenzi P, PA-C   5 mg at 06/23/23 2206     Discharge Medications: Please see discharge summary for a list of discharge medications.  Relevant Imaging Results:  Relevant Lab Results:   Additional Information SS# [id number]  Deatra Robinson, [place]

## 2023-06-25 DIAGNOSIS — I4729 Other ventricular tachycardia: Secondary | ICD-10-CM | POA: Diagnosis not present

## 2023-06-25 DIAGNOSIS — I2510 Atherosclerotic heart disease of native coronary artery without angina pectoris: Secondary | ICD-10-CM

## 2023-06-25 DIAGNOSIS — I5032 Chronic diastolic (congestive) heart failure: Secondary | ICD-10-CM | POA: Diagnosis not present

## 2023-06-25 DIAGNOSIS — I998 Other disorder of circulatory system: Secondary | ICD-10-CM | POA: Diagnosis not present

## 2023-06-25 DIAGNOSIS — Z952 Presence of prosthetic heart valve: Secondary | ICD-10-CM | POA: Diagnosis not present

## 2023-06-25 DIAGNOSIS — K5900 Constipation, unspecified: Secondary | ICD-10-CM

## 2023-06-25 MED ORDER — GLYCERIN (LAXATIVE) 2 G RE SUPP
1.0000 | Freq: Once | RECTAL | Status: AC
  Administered 2023-06-25: 1 via RECTAL
  Filled 2023-06-25: qty 1

## 2023-06-25 NOTE — Progress Notes (Signed)
Progress Note  Patient Name: [name] Date of Encounter: 06/25/2023  Primary Cardiologist: Tonny Bollman, MD  Subjective  No acute events overnight, patient has not moved her bowels.  Will order some suppositories today.   Inpatient Medications    Scheduled Meds:  aspirin EC  81 mg Oral Daily   atorvastatin  20 mg Oral Daily   citalopram  20 mg Oral Daily   empagliflozin  10 mg Oral QAC breakfast   famotidine  40 mg Oral Daily   levothyroxine  75 mcg Oral Q0600   losartan  50 mg Oral Daily   metoprolol tartrate  25 mg Oral BID   senna-docusate  1 tablet Oral Daily   sodium chloride flush  3 mL Intravenous Q12H   Continuous Infusions:  sodium chloride     nitroGLYCERIN     PRN Meds: sodium chloride, acetaminophen **OR** acetaminophen, LORazepam, ondansetron (ZOFRAN) IV, oxyCODONE, polyethylene glycol, senna, sodium chloride flush, zolpidem   Vital Signs    Vitals:   06/25/23 0600 06/25/23 0839 06/25/23 1216 06/25/23 1606  BP:  (!) 133/56 (!) 104/50 (!) 128/50  Pulse:  78 63 72  Resp:  18 17 16   Temp:  97.9 F (36.6 C) 97.7 F (36.5 C) 98.4 F (36.9 C)  TempSrc:  Oral Oral Oral  SpO2:  98% 96% 100%  Weight: 61.2 kg     Height:        Intake/Output Summary (Last 24 hours) at 06/25/2023 1705 Last data filed at 06/25/2023 1600 Gross per 24 hour  Intake 960 ml  Output 802 ml  Net 158 ml   Filed Weights   06/23/23 0702 06/24/23 0315 06/25/23 0600  Weight: 58.7 kg 55.7 kg 61.2 kg    Telemetry     Personally reviewed.  ECG    Not performed today  Physical Exam   GEN: No acute distress.   Neck: No JVD. Cardiac: RRR, no murmur, rub, or gallop.  Respiratory: Nonlabored. Clear to auscultation bilaterally. GI: Soft, nontender, bowel sounds present. MS: No edema; No deformity. Neuro:  Nonfocal. Psych: Alert and oriented x 3. Normal affect.  Labs    Chemistry Recent Labs  Lab 06/22/23 2332 06/23/23 2349 06/25/23 0444  NA 131* 130* 130*   K 3.8 3.8 3.9  CL 100 95* 96*  CO2 23 28 26   GLUCOSE 136* 93 106*  BUN 11 18 14   CREATININE 0.74 0.89 0.68  CALCIUM 8.5* 8.4* 8.2*  GFRNONAA >60 >60 >60  ANIONGAP 8 7 8      Hematology Recent Labs  Lab 06/22/23 2332 06/23/23 2349 06/25/23 0444  WBC 9.8 10.2 9.2  RBC 3.86* 3.89 3.75*  HGB 11.7* 11.6* 11.0*  HCT 35.2* 35.5* 33.2*  MCV 91.2 91.3 88.5  MCH 30.3 29.8 29.3  MCHC 33.2 32.7 33.1  RDW 14.0 14.1 13.8  PLT 182 173 156    Cardiac Enzymes Recent Labs  Lab 06/02/23 0943 06/02/23 1240  TROPONINIHS 9 11    BNPNo results for input(s): "BNP", "PROBNP" in the last 168 hours.   DDimerNo results for input(s): "DDIMER" in the last 168 hours.   Radiology    No results found.  Assessment & Plan   Patient is 82 year old F known to have CAD s/p CABG x 5 in 22,007, HTN, HLD, chronic diastolic heart failure, breast cancer status postmastectomy, aortic stenosis presented to [facility] on 06/21/2023 plan for TAVR.  Severe AS s/p successful TAVR with a 20 mm Edwards SAPIEN 3 ultra Resilia  THV via the TF approach on 06/21/2023. Postop echo showed normal LVEF, normal functioning TAVR with a mean gradient of 7 mmHg and no PVL.  Continue home aspirin.  Ischemic right leg s/p femoral thrombectomy/endarterectomy with bovine pericardial patch angioplasty, follow vascular recs.  Severe constipation: Last time she moved her bowels was on Monday. Currently on laxatives, MiraLAX, not working.  Will order suppositories.  NSVT on 8/7 on daily, resumed beta-blocker.  CAD s/p CABG in 2007 (patent 5/5 bypass grafts), continue medical therapy.  Chronic diastolic heart failure: Euvolemic, resume p.o. Lasix upon discharge.  Disposition: Patient does not want to go to SNF anymore, wants to go to home with her daughter.   Signed, Marjo Bicker, MD  06/25/2023, 5:05 PM

## 2023-06-25 NOTE — TOC Progression Note (Signed)
Transition of Care [facility]) - Progression Note    Patient Details  Name: [name] MRN: [id number] Date of Birth: March 12, 1941  Transition of [facility]) CM/SW Contact  Carmina Miller, [place] Phone Number: 06/25/2023, 9:22 AM  Clinical Narrative:     CSW spoke with AD at [facility], no bed today, possible bed tomorrow if there is a dc.   Expected Discharge Plan: Skilled Nursing Facility Barriers to Discharge: Continued Medical Work up, SNF Pending bed offer  Expected Discharge Plan and Services                                               Social Determinants of Health (SDOH) Interventions SDOH Screenings   Food Insecurity: No Food Insecurity (06/25/2023)  Housing: Low Risk  (06/25/2023)  Transportation Needs: No Transportation Needs (06/25/2023)  Utilities: Not At Risk (06/25/2023)  Alcohol Screen: Low Risk  (09/02/2022)  Depression (PHQ2-9): Low Risk  (02/02/2023)  Financial Resource Strain: Low Risk  (09/02/2022)  Physical Activity: Inactive (09/02/2022)  Social Connections: Moderately Integrated (09/02/2022)  Stress: No Stress Concern Present (09/02/2022)  Tobacco Use: Low Risk  (06/22/2023)    Readmission Risk Interventions     No data to display

## 2023-06-26 ENCOUNTER — Inpatient Hospital Stay (HOSPITAL_COMMUNITY): Payer: Medicare Other

## 2023-06-26 DIAGNOSIS — I4729 Other ventricular tachycardia: Secondary | ICD-10-CM | POA: Diagnosis not present

## 2023-06-26 DIAGNOSIS — I998 Other disorder of circulatory system: Secondary | ICD-10-CM | POA: Diagnosis not present

## 2023-06-26 DIAGNOSIS — K5900 Constipation, unspecified: Secondary | ICD-10-CM

## 2023-06-26 DIAGNOSIS — Z952 Presence of prosthetic heart valve: Secondary | ICD-10-CM | POA: Diagnosis not present

## 2023-06-26 DIAGNOSIS — I5032 Chronic diastolic (congestive) heart failure: Secondary | ICD-10-CM | POA: Diagnosis not present

## 2023-06-26 LAB — CBC
HCT: 33 % — ABNORMAL LOW (ref 36.0–46.0)
Hemoglobin: 10.9 g/dL — ABNORMAL LOW (ref 12.0–15.0)
MCH: 30.1 pg (ref 26.0–34.0)
MCHC: 33 g/dL (ref 30.0–36.0)
MCV: 91.2 fL (ref 80.0–100.0)
Platelets: 168 10*3/uL (ref 150–400)
RBC: 3.62 MIL/uL — ABNORMAL LOW (ref 3.87–5.11)
RDW: 13.9 % (ref 11.5–15.5)
WBC: 8.4 10*3/uL (ref 4.0–10.5)
nRBC: 0 % (ref 0.0–0.2)

## 2023-06-26 LAB — BASIC METABOLIC PANEL WITH GFR
Anion gap: 9 (ref 5–15)
BUN: 12 mg/dL (ref 8–23)
CO2: 26 mmol/L (ref 22–32)
Calcium: 8.4 mg/dL — ABNORMAL LOW (ref 8.9–10.3)
Chloride: 98 mmol/L (ref 98–111)
Creatinine, Ser: 0.64 mg/dL (ref 0.44–1.00)
GFR, Estimated: >60 mL/min (ref >60)
Glucose, Bld: 88 mg/dL (ref 70–99)
Potassium: 3.7 mmol/L (ref 3.5–5.1)
Sodium: 133 mmol/L — ABNORMAL LOW (ref 135–145)

## 2023-06-26 LAB — MAGNESIUM: Magnesium: 2 mg/dL (ref 1.7–2.4)

## 2023-06-26 MED ORDER — SENNOSIDES-DOCUSATE SODIUM 8.6-50 MG PO TABS
2.0000 | ORAL_TABLET | Freq: Every day | ORAL | Status: DC
  Administered 2023-06-27: 2 via ORAL
  Filled 2023-06-26: qty 2

## 2023-06-26 MED ORDER — POLYETHYLENE GLYCOL 3350 17 G PO PACK
17.0000 g | PACK | Freq: Every day | ORAL | Status: DC
  Administered 2023-06-26 – 2023-06-27 (×2): 17 g via ORAL
  Filled 2023-06-26 (×2): qty 1

## 2023-06-26 MED ORDER — GLYCERIN (LAXATIVE) 2 G RE SUPP
1.0000 | Freq: Once | RECTAL | Status: DC
  Filled 2023-06-26: qty 1

## 2023-06-26 NOTE — Progress Notes (Signed)
Progress Note  Patient Name: [name] Date of Encounter: 06/26/2023  Primary Cardiologist: Tonny Bollman, MD  Subjective  No acute events overnight, patient has not moved her bowels despite laxatives and rectal suppository.  Inpatient Medications    Scheduled Meds:  aspirin EC  81 mg Oral Daily   atorvastatin  20 mg Oral Daily   citalopram  20 mg Oral Daily   empagliflozin  10 mg Oral QAC breakfast   famotidine  40 mg Oral Daily   levothyroxine  75 mcg Oral Q0600   losartan  50 mg Oral Daily   metoprolol tartrate  25 mg Oral BID   senna-docusate  1 tablet Oral Daily   sodium chloride flush  3 mL Intravenous Q12H   Continuous Infusions:  sodium chloride     nitroGLYCERIN     PRN Meds: sodium chloride, acetaminophen **OR** acetaminophen, LORazepam, ondansetron (ZOFRAN) IV, oxyCODONE, polyethylene glycol, senna, sodium chloride flush, zolpidem   Vital Signs    Vitals:   06/25/23 1915 06/25/23 2320 06/26/23 0315 06/26/23 0800  BP: (!) 124/58 (!) 131/53 (!) 108/44 (!) 162/76  Pulse: 90 70  77  Resp: 15 18 17 16   Temp: 98.3 F (36.8 C) 98.1 F (36.7 C) 98 F (36.7 C) 98 F (36.7 C)  TempSrc: Oral Oral Oral Oral  SpO2: 98% 92%  98%  Weight:      Height:        Intake/Output Summary (Last 24 hours) at 06/26/2023 0926 Last data filed at 06/26/2023 0800 Gross per 24 hour  Intake 720 ml  Output 700 ml  Net 20 ml   Filed Weights   06/23/23 0702 06/24/23 0315 06/25/23 0600  Weight: 58.7 kg 55.7 kg 61.2 kg    Telemetry     Personally reviewed.  ECG    Not performed today  Physical Exam   GEN: No acute distress.   Neck: No JVD. Cardiac: RRR, no murmur, rub, or gallop.  Respiratory: Nonlabored. Clear to auscultation bilaterally. GI: Soft, nontender, bowel sounds present. MS: No edema; No deformity. Neuro:  Nonfocal. Psych: Alert and oriented x 3. Normal affect.  Labs    Chemistry Recent Labs  Lab 06/23/23 2349 06/25/23 0444 06/26/23 0411   NA 130* 130* 133*  K 3.8 3.9 3.7  CL 95* 96* 98  CO2 28 26 26   GLUCOSE 93 106* 88  BUN 18 14 12   CREATININE 0.89 0.68 0.64  CALCIUM 8.4* 8.2* 8.4*  GFRNONAA >60 >60 >60  ANIONGAP 7 8 9      Hematology Recent Labs  Lab 06/23/23 2349 06/25/23 0444 06/26/23 0411  WBC 10.2 9.2 8.4  RBC 3.89 3.75* 3.62*  HGB 11.6* 11.0* 10.9*  HCT 35.5* 33.2* 33.0*  MCV 91.3 88.5 91.2  MCH 29.8 29.3 30.1  MCHC 32.7 33.1 33.0  RDW 14.1 13.8 13.9  PLT 173 156 168    Cardiac Enzymes Recent Labs  Lab 06/02/23 0943 06/02/23 1240  TROPONINIHS 9 11    BNPNo results for input(s): "BNP", "PROBNP" in the last 168 hours.   DDimerNo results for input(s): "DDIMER" in the last 168 hours.   Radiology    No results found.  Assessment & Plan   Patient is 82 year old F known to have CAD s/p CABG x 5 in 22,007, HTN, HLD, chronic diastolic heart failure, breast cancer status postmastectomy, aortic stenosis presented to [facility] on 06/21/2023 plan for TAVR.  Severe AS s/p successful TAVR with a 20 mm Edwards SAPIEN 3 ultra  Resilia THV via the TF approach on 06/21/2023. Postop echo showed normal LVEF, normal functioning TAVR with a mean gradient of 7 mmHg and no PVL. Continue home aspirin.  Ischemic right leg s/p femoral thrombectomy/endarterectomy with bovine pericardial patch angioplasty, follow vascular recs.  Severe constipation: She moved her bowels 1 week ago.  Received a glycerin suppository with no relief and she has been on laxatives for the last 1 week.  Will call medicine for management of severe constipation.  NSVT on 8/7, resumed beta-blocker.  CAD s/p CABG in 2007 (patent 5/5 bypass grafts), continue medical therapy.  Chronic diastolic heart failure: Euvolemic, resume p.o. Lasix upon discharge.  Disposition: Patient changed her mind and wants to go to SNF now as her daughter has a lot of things on her plate now.   Signed, Marjo Bicker, MD  06/26/2023, 9:26 AM

## 2023-06-26 NOTE — Progress Notes (Signed)
Physical Therapy Treatment Patient Details Name: [name] MRN: [id number] DOB: 06-06-1941 Today's Date: 06/26/2023   History of Present Illness Pt is an 82 y.o. female admitted 06/21/23 for planned same day TAVR. Pt also with R common femoral artery occlusion s/p R femoral endarterectomy, thrombectomy 8/7. PMH includes HTN, HLD, OA, breast CA (1991), cataract, depression, anxiety.    PT Comments  Pt progressing steadily towards her physical therapy goals and is motivated to participate. Requiring min assist for transfers and ambulating hallway distances with a walker at a min guard assist level. Will benefit from continued inpatient follow up therapy, <3 hours/day to address strengthening, balance, endurance, powre.      If plan is discharge home, recommend the following: A little help with walking and/or transfers;A little help with bathing/dressing/bathroom;Assistance with cooking/housework;Assist for transportation;Help with stairs or ramp for entrance   Can travel by private vehicle     Yes  Equipment Recommendations  Other (comment) (defer)    Recommendations for Other Services       Precautions / Restrictions Precautions Precautions: Fall Restrictions Weight Bearing Restrictions: No     Mobility  Bed Mobility               General bed mobility comments: OOB in chair    Transfers Overall transfer level: Needs assistance Equipment used: Rolling walker (2 wheels) Transfers: Sit to/from Stand Sit to Stand: Min assist           General transfer comment: Light minA to power up to stand from recliner/toilet and to assist with eccentric lowering    Ambulation/Gait Ambulation/Gait assistance: Contact guard assist Gait Distance (Feet): 200 Feet Assistive device: Rolling walker (2 wheels) Gait Pattern/deviations: Step-through pattern, Decreased stride length Gait velocity: decreased     General Gait Details: Slow and steady pace, cues for activity  pacing   Stairs             Wheelchair Mobility     Tilt Bed    Modified Rankin (Stroke Patients Only)       Balance Overall balance assessment: Needs assistance Sitting-balance support: Feet supported Sitting balance-Leahy Scale: Good     Standing balance support: Bilateral upper extremity supported, During functional activity Standing balance-Leahy Scale: Poor                              Cognition Arousal: Alert Behavior During Therapy: WFL for tasks assessed/performed, Flat affect Overall Cognitive Status: Within Functional Limits for tasks assessed                                          Exercises      General Comments        Pertinent Vitals/Pain Pain Assessment Pain Assessment: Faces Faces Pain Scale: No hurt    Home Living                          Prior Function            PT Goals (current goals can now be found in the care plan section) Acute Rehab PT Goals Patient Stated Goal: regain strength and indep at short-term rehab Potential to Achieve Goals: Good Progress towards PT goals: Progressing toward goals    Frequency    Min 1X/week  PT Plan      Co-evaluation              AM-PAC PT "6 Clicks" Mobility   Outcome Measure  Help needed turning from your back to your side while in a flat bed without using bedrails?: A Little Help needed moving from lying on your back to sitting on the side of a flat bed without using bedrails?: A Little Help needed moving to and from a bed to a chair (including a wheelchair)?: A Little Help needed standing up from a chair using your arms (e.g., wheelchair or bedside chair)?: A Little Help needed to walk in hospital room?: A Little Help needed climbing 3-5 steps with a railing? : A Lot 6 Click Score: 17    End of Session   Activity Tolerance: Patient tolerated treatment well Patient left: with call bell/phone within reach;in bed Nurse  Communication: Mobility status PT Visit Diagnosis: Other abnormalities of gait and mobility (R26.89);Muscle weakness (generalized) (M62.81);Pain     Time: 8657-8469 PT Time Calculation (min) (ACUTE ONLY): 25 min  Charges:    $Therapeutic Activity: 23-37 mins PT General Charges $$ ACUTE PT VISIT: 1 Visit                     Lillia Pauls, PT, DPT Acute Rehabilitation Services Office [phone]    Norval Morton 06/26/2023, 3:41 PM

## 2023-06-26 NOTE — Consult Note (Signed)
Initial Consultation Note   Patient: [name] DGU:[id number] DOB: 05-30-1941 PCP: Pincus Sanes, MD DOA: 06/21/2023 DOS: the patient was seen and examined on 06/26/2023 Primary service: Kathleene Hazel,*  Referring physician: Dr. Jenene Slicker  Reason for consult: constipation   Assessment/Plan: Assessment and Plan: Constipation 82 year old with recent s/p TAVR on 8/6 then 8/7 had right femoral endarterectomy and thrombectomy who we are being consulted to help with management of her constipation.  -TSH wnl three weeks ago -check magnesium, other electrolytes wnl -check abdominal xray -exam and history very reassuring with soft abdomen, normal BS, no N/V, eating well and passing gas regularly  -need to get her up and moving more. Discussed with cardiology and okay with this. Asked nurse to get her up more than once a day and sent note to PT -she has been conservatively drinking water due to incontinence. Discussed I need her to drink normal for me to help with bowels, she has a purewick in place.  -change meds to 2 senna-docusate/day -miralax daily instead of PRN -repeat suppository since fell out -limit pain medication, but she states she hasn't really taken this  -jardiance can also contribute to constipation  -f/u on xray     Other management per primary team    [facility] will continue to follow the patient.  HPI: [name] is a 82 y.o. female with past medical history of severe aortic stenosis, GERD, HTN, HLD, CAD, hypothyroidism, CAD s/p CABG x5 in 2007, diastolic CHF, hx of CVA who was admitted by cardiology on 06/21/23 for TAVR for severe aortic stenosis. On 8/7 she was found to have right femoral occlusion and vascular surgery was consulted. She underwent Right femoral endarterectomy with bovine pericardial patch angioplasty and right femoral thrombectomy by dr. Myra Gianotti on 06/22/23. Complained of constipation on 8/9 and home docusate started and laxative ordered. Glycerin  suppositories ordered yesterday with no BM. Cardiology requesting assitance with constipation.   She tells me her last normal BM was on Monday 8/5. She was NPO and had her procedure on 8/6 and her home constipation medication was not started back until 8/9. She also typically drinks a certain amount of water and thinks she is not drinking much because of her incontinence. She did have a glycerin suppository placed yeseterday, had an urge to go and had a small ball of stool and the suppository come out.   She denies any N/V, abdominal pain. Is only walking once a day. Passing gas regularly.   Review of Systems: As mentioned in the history of present illness. All other systems reviewed and are negative. Past Medical History:  Diagnosis Date   Allergy    SEASONAL   Anxiety    BREAST CANCER 1991   left, 1991; B mastectomy   CAD (coronary artery disease)    s/p CABG x5 in 2007   Cataract    BILATERAL-REMOVED   Depression    DIVERTICULOSIS    GERD    History of DVT (deep vein thrombosis)    HYPERLIPIDEMIA    Hypertension    HYPOTHYROIDISM    Occlusion and stenosis of carotid artery without mention of cerebral infarction    OSTEOPOROSIS    S/P TAVR (transcatheter aortic valve replacement) 06/21/2023   s/p TAVR with a 23mm Edwards S3UR via the TF approach by Dr. Clifton James & Dr. Leafy Ro   Severe aortic stenosis    Past Surgical History:  Procedure Laterality Date   APPENDECTOMY     COLONOSCOPY  CORONARY ARTERY BYPASS GRAFT     5        2007   ENDARTERECTOMY FEMORAL Right 06/22/2023   Procedure: RIGHT FEMORAL ENDARTERECTOMY WITH PATCH ANGIOPLASTY;  Surgeon: Nada Libman, MD;  Location: [facility] OR;  Service: Vascular;  Laterality: Right;   FEMORAL-POPLITEAL BYPASS GRAFT Right 06/22/2023   Procedure: FEMORAL THROMBECTOMY;  Surgeon: Nada Libman, MD;  Location: [facility] OR;  Service: Vascular;  Laterality: Right;   INTRAOPERATIVE TRANSTHORACIC ECHOCARDIOGRAM N/A 06/21/2023   Procedure:  INTRAOPERATIVE TRANSTHORACIC ECHOCARDIOGRAM;  Surgeon: Kathleene Hazel, MD;  Location: [facility]AS[facility];  Service: Open Heart Surgery;  Laterality: N/A;   MASTECTOMY     left with reconstruction and lymph node excision  1992   MASTECTOMY     right with reconstruction   PATCH ANGIOPLASTY Right 06/22/2023   Procedure: PATCH ANGIOPLASTY OF RIGHT FEMORAL ARTERY USING BOVINE PERICARDIAL PATCH;  Surgeon: Nada Libman, MD;  Location: [facility] OR;  Service: Vascular;  Laterality: Right;   REVISION RECONSTRUCTED BREAST Left 02/2014   willard (plastics in [place])   right ankle arthroscopy     RIGHT/LEFT HEART CATH AND CORONARY ANGIOGRAPHY N/A 06/09/2023   Procedure: RIGHT/LEFT HEART CATH AND CORONARY ANGIOGRAPHY;  Surgeon: Kathleene Hazel, MD;  Location: [facility];  Service: Cardiovascular;  Laterality: N/A;   TEAR DUCT CANAL     TONSILLECTOMY AND ADENOIDECTOMY     TRANSCATHETER AORTIC VALVE REPLACEMENT, TRANSFEMORAL N/A 06/21/2023   Procedure: Transcatheter Aortic Valve Replacement, Transfemoral;  Surgeon: Kathleene Hazel, MD;  Location: [facility];  Service: Open Heart Surgery;  Laterality: N/A;   UPPER GASTROINTESTINAL ENDOSCOPY     VAGINAL HYSTERECTOMY     ovaries not excised   Social History:  reports that she has never smoked. She has never used smokeless tobacco. She reports that she does not drink alcohol and does not use drugs.  Allergies  Allergen Reactions   Clarithromycin Other (See Comments)    PATIENT UNSURE OF REACTION   Clindamycin Other (See Comments)   Codeine     REACTION: nausea   Levofloxacin     REACTION: nausea   Morphine Other (See Comments)    Nausea  Nausea    Naproxen     REACTION: nausea   Omeprazole Other (See Comments)    Made her jittery, made her feel weird, ?rash   Penicillins Other (See Comments) and Rash    REACTION: RASH REACTION: RASH    Pneumococcal Vaccines Rash    Small rash on arm at injection site.      Family History  Problem Relation Age of Onset   Colon cancer Sister    Colon cancer Sister    Breast cancer Mother    Lung disease Mother    Emphysema Father    Esophageal cancer Neg Hx    Rectal cancer Neg Hx    Stomach cancer Neg Hx    Thyroid disease Neg Hx     Prior to Admission medications   Medication Sig Start Date End Date Taking? Authorizing Provider  aspirin 81 MG tablet Take 81 mg by mouth daily.     Yes [provider]  atorvastatin (LIPITOR) 20 MG tablet TAKE 1 TABLET BY MOUTH EVERY DAY 06/20/23  Yes Burns, Bobette Mo, MD  citalopram (CELEXA) 20 MG tablet TAKE 1 TABLET BY MOUTH EVERY DAY 11/03/22  Yes Burns, Bobette Mo, MD  famotidine (PEPCID) 40 MG tablet Take 1 tablet (40 mg total) by mouth  daily. 02/02/23  Yes Burns, Bobette Mo, MD  furosemide (LASIX) 20 MG tablet TAKE 1 TABLET BY MOUTH EVERY DAY 05/24/23  Yes Tonny Bollman, MD  levothyroxine (SYNTHROID) 75 MCG tablet TAKE 1 TABLET BY MOUTH EVERY MORNING BEFORE BREAKFAST 06/06/23  Yes Burns, Bobette Mo, MD  losartan (COZAAR) 50 MG tablet TAKE 1 TABLET BY MOUTH EVERY DAY 03/11/23  Yes Tonny Bollman, MD  meloxicam (MOBIC) 7.5 MG tablet TAKE 1/2 TO 1 TABLET EVERY DAY 04/22/23  Yes Burns, Bobette Mo, MD  metoprolol tartrate (LOPRESSOR) 25 MG tablet Take 1 tablet (25 mg total) by mouth 2 (two) times daily. 02/02/23  Yes Burns, Bobette Mo, MD  senna-docusate (SENOKOT-S) 8.6-50 MG per tablet Take 1 tablet by mouth daily.     Yes [provider]  zolpidem (AMBIEN) 5 MG tablet Take 1 tablet (5 mg total) by mouth at bedtime as needed. for sleep Patient taking differently: Take 5 mg by mouth at bedtime as needed for sleep. 02/02/23  Yes Burns, Bobette Mo, MD  Calcium Carbonate (CALTRATE 600 PO) Take 1 tablet by mouth 2 (two) times daily.      [provider]  co-enzyme Q-10 30 MG capsule Take 30 mg by mouth daily.    [provider]  empagliflozin (JARDIANCE) 10 MG TABS tablet Take 1 tablet (10 mg total) by mouth daily  before breakfast. 08/23/22   Tonny Bollman, MD  fluticasone [facility]) 50 MCG/ACT nasal spray Place 1 spray into both nostrils daily as needed for allergies or rhinitis. 10/12/22   Pincus Sanes, MD  Multiple Vitamins-Minerals (MULTIVITAMIN) LIQD Take 1 tablet by mouth daily. 11/05/21   [provider]  Multiple Vitamins-Minerals (PRESERVISION AREDS 2) CAPS Take 1 capsule by mouth 2 (two) times daily.    [provider]  VITAMIN D, CHOLECALCIFEROL, PO Take 1 tablet by mouth daily.    [provider]    Physical Exam: Vitals:   06/25/23 1915 06/25/23 2320 06/26/23 0315 06/26/23 0800  BP: (!) 124/58 (!) 131/53 (!) 108/44 (!) 162/76  Pulse: 90 70  77  Resp: 15 18 17 16   Temp: 98.3 F (36.8 C) 98.1 F (36.7 C) 98 F (36.7 C) 98 F (36.7 C)  TempSrc: Oral Oral Oral Oral  SpO2: 98% 92%  98%  Weight:      Height:       Physical Exam Constitutional:      General: She is not in acute distress.    Appearance: She is normal weight. She is not ill-appearing.  HENT:     Head: Normocephalic and atraumatic.  Cardiovascular:     Rate and Rhythm: Normal rate and regular rhythm.  Pulmonary:     Effort: Pulmonary effort is normal.     Breath sounds: Normal breath sounds.  Abdominal:     General: Bowel sounds are normal. There is no distension.     Palpations: Abdomen is soft.     Tenderness: There is abdominal tenderness (in RLQ near recent incision).  Skin:    General: Skin is warm and dry.     Capillary Refill: Capillary refill takes less than 2 seconds.     Comments: Right groin incision with no erythema/drainage   Neurological:     General: No focal deficit present.     Mental Status: She is alert and oriented to person, place, and time.     Data Reviewed:  Labs reviewed   Family Communication: daughter at bedside  Primary team communication: to MD  Thank you very much for involving [place] in the care of your patient.  Author: Orland Mustard,  MD 06/26/2023 11:36 AM  For on call review [email or web address].

## 2023-06-26 NOTE — Assessment & Plan Note (Addendum)
82 year old with recent s/p TAVR on 8/6 then 8/7 had right femoral endarterectomy and thrombectomy who we are being consulted to help with management of her constipation.  -TSH wnl three weeks ago -check magnesium, other electrolytes wnl -check abdominal xray -exam and history very reassuring with soft abdomen, normal BS, no N/V, eating well and passing gas regularly  -need to get her up and moving more. Discussed with cardiology and okay with this. Asked nurse to get her up more than once a day and sent note to PT -she has been conservatively drinking water due to incontinence. Discussed I need her to drink normal for me to help with bowels, she has a purewick in place.  -change meds to 2 senna-docusate/day -miralax daily instead of PRN -repeat suppository since fell out -limit pain medication, but she states she hasn't really taken this  -jardiance can also contribute to constipation  -f/u on xray

## 2023-06-27 DIAGNOSIS — E039 Hypothyroidism, unspecified: Secondary | ICD-10-CM

## 2023-06-27 DIAGNOSIS — I350 Nonrheumatic aortic (valve) stenosis: Secondary | ICD-10-CM | POA: Diagnosis not present

## 2023-06-27 DIAGNOSIS — K5900 Constipation, unspecified: Secondary | ICD-10-CM | POA: Diagnosis not present

## 2023-06-27 DIAGNOSIS — Z952 Presence of prosthetic heart valve: Secondary | ICD-10-CM | POA: Diagnosis not present

## 2023-06-27 MED ORDER — BISACODYL 10 MG RE SUPP: 10.0000 mg | Freq: Every day | RECTAL | Status: DC | PRN

## 2023-06-27 MED ORDER — POLYETHYLENE GLYCOL 3350 17 G PO PACK: 17.0000 g | PACK | Freq: Two times a day (BID) | ORAL | Status: DC

## 2023-06-27 NOTE — Progress Notes (Addendum)
CCMD notified that pt had 17 beats of non sustain V tach,pt remains asymptomatic ,vitals checked,PA notified  Susmita Rimal ______________________  I reviewed strips and it was SVT not VT.   Cline Crock PA-C  [facility]

## 2023-06-27 NOTE — Progress Notes (Signed)
Mobility Specialist Progress Note:   06/27/23 1045  Mobility  Activity Ambulated with assistance in hallway  Level of Assistance Contact guard assist, steadying assist  Assistive Device Front wheel walker  Distance Ambulated (ft) 64 ft  Activity Response Tolerated well  Mobility Referral Yes  $Mobility charge 1 Mobility  Mobility Specialist Start Time (ACUTE ONLY) 1005  Mobility Specialist Stop Time (ACUTE ONLY) 1040  Mobility Specialist Time Calculation (min) (ACUTE ONLY) 35 min   Pre Mobility: 59 HR  During Mobility: 62 HR  Post Mobility: 63 HR   Pt received in bed, agreeable to mobility. X2 standing rest break d/t general weakness and slight lightheadedness. Pt left in chair with call bell in hand and all needs met. RN present in room.   Leory Plowman  Mobility Specialist Please contact via [employer] office at [phone]

## 2023-06-27 NOTE — Progress Notes (Signed)
Attempted to call [facility] RN 4 times ,but the call was unanswered

## 2023-06-27 NOTE — TOC Transition Note (Signed)
Transition of Care [facility]) - CM/SW Discharge Note   Patient Details  Name: [name] MRN: [id number] Date of Birth: July 08, 1941  [facility] CM/SW Contact:  Eduard Roux, LCSW Phone Number: 06/27/2023, 11:48 AM   Clinical Narrative:     Patient will Discharge to: [facility]  Discharge Date: 06/27/2023 Family Notified: son Transport By: Sharin Mons  Per MD patient is ready for discharge. RN, patient, and facility notified of discharge. Discharge Summary sent to facility. RN given number for [phone], option [phone]- Rm [phone]. Ambulance transport requested for patient.   Clinical Social Worker signing off.  Antony Blackbird, MSW, LCSW Clinical Social Worker     Final next level of care: Skilled Nursing Facility Barriers to Discharge: Barriers Resolved   Patient Goals and CMS Choice CMS Medicare.gov Compare Post Acute Care list provided to:: Patient Choice offered to / list presented to : Patient  Discharge Placement                Patient chooses bed at: [facility] Patient to be transferred to facility by: [facility] Name of family member notified: son, Loraine Leriche Patient and family notified of of transfer: 06/27/23  Discharge Plan and Services Additional resources added to the After Visit Summary for                                       Social Determinants of Health (SDOH) Interventions SDOH Screenings   Food Insecurity: No Food Insecurity (06/25/2023)  Housing: Low Risk  (06/25/2023)  Transportation Needs: No Transportation Needs (06/25/2023)  Utilities: Not At Risk (06/25/2023)  Alcohol Screen: Low Risk  (09/02/2022)  Depression (PHQ2-9): Low Risk  (02/02/2023)  Financial Resource Strain: Low Risk  (09/02/2022)  Physical Activity: Inactive (09/02/2022)  Social Connections: Moderately Integrated (09/02/2022)  Stress: No Stress Concern Present (09/02/2022)  Tobacco Use: Low Risk  (06/22/2023)     Readmission Risk  Interventions     No data to display

## 2023-06-27 NOTE — Progress Notes (Signed)
Pt hypertensive this AM with SBP 170s-180s. AM Cozaar given early and PA on-call Robet Leu) notified via secure chat and a separate text page. Will convey to Day RN.

## 2023-06-27 NOTE — Progress Notes (Signed)
CARDIAC REHAB PHASE I    Stopped by to assist with walk. Pt declined due to incontinence. Pt would like new adult diaper prior to ambulation. Will ask Susmita Rn to assist her. Post TAVR education completed. Plan for SNF discharge today, if bed available.   0930-1000  Woodroe Chen, RN BSN 06/27/2023 9:57 AM

## 2023-06-27 NOTE — Progress Notes (Signed)
PROGRESS NOTE    [name]  ZOX:[id number] DOB: 18-Dec-1940 DOA: 06/21/2023 PCP: Pincus Sanes, MD    Brief Narrative:  [name] is a 82 y.o. female with past medical history of severe aortic stenosis, GERD, HTN, HLD, CAD, hypothyroidism, CAD s/p CABG x5 in 2007, diastolic CHF, hx of CVA was admitted to hospital by cardiology on 06/21/23 for TAVR for severe aortic stenosis. On 06/22/23 she was found to have right femoral occlusion and vascular surgery was consulted and patient underwent right femoral endarterectomy with bovine pericardial patch angioplasty and right femoral thrombectomy by dr. Myra Gianotti on 06/22/23.  Patient complained of constipation on 8/9 and home docusate started and laxative ordered. Glycerin suppositories ordered yesterday with no BM. Cardiology requested consultation for constipation.  Assessment and Plan:   Constipation TSH within normal limits.  X-ray of abdomen with moderate chronic stool burden.  Will need to continue to ambulate.  Increase hydration, continue daily MiraLAX, suppository, Senokot.  Limit pain medication if possible.  Had a bowel movement yesterday.  Feels better.   Severe aortic stenosis status post TAVR Ischemic right leg status post femoral thrombectomy and endarterectomy History of CAD CABG. Chronic diastolic heart failure. .  As per primary team.   DVT prophylaxis: SCDs Start: 06/21/23 1539   Code Status:     Code Status: Full Code  Disposition: As per primary team.  Medically stable for disposition.  Status is: Inpatient   Family Communication: None at bedside  Subjective: Today, patient was seen and examined at bedside.  Feels well.  Has had bowel movements.  Objective: Vitals:   06/27/23 0608 06/27/23 0609 06/27/23 0733 06/27/23 1006  BP: (!) 180/64 (!) 176/71 (!) 172/79 (!) 145/58  Pulse: 66 70 78 60  Resp: 19 20 15 16   Temp:   98.2 F (36.8 C)   TempSrc:   Oral   SpO2: 96% 97% 97% 96%  Weight:      Height:         Intake/Output Summary (Last 24 hours) at 06/27/2023 1127 Last data filed at 06/26/2023 2145 Gross per 24 hour  Intake 363 ml  Output 500 ml  Net -137 ml   Filed Weights   06/23/23 0702 06/24/23 0315 06/25/23 0600  Weight: 58.7 kg 55.7 kg 61.2 kg    Physical Examination: Body mass index is 24.68 kg/m.   General:  Average built, not in obvious distress HENT:   No scleral pallor or icterus noted. Oral mucosa is moist.  Chest: Diminished breath sounds bilaterally. No crackles or wheezes.  CVS: S1 &S2 heard.  Systolic murmur.  Regular rate and rhythm. Abdomen: Soft, nontender, nondistended.  Bowel sounds are heard.   Extremities: No cyanosis, clubbing or edema.  Peripheral pulses are palpable. Psych: Alert, awake and oriented, normal mood CNS:  No cranial nerve deficits.  Power equal in all extremities.   Skin: Warm and dry.  No rashes noted.  Data Reviewed:   CBC: Recent Labs  Lab 06/21/23 2357 06/22/23 1600 06/22/23 2332 06/23/23 2349 06/25/23 0444 06/26/23 0411  WBC 8.6  --  9.8 10.2 9.2 8.4  HGB 11.9* 10.9* 11.7* 11.6* 11.0* 10.9*  HCT 36.5 32.0* 35.2* 35.5* 33.2* 33.0*  MCV 91.3  --  91.2 91.3 88.5 91.2  PLT 212  --  182 173 156 168    Basic Metabolic Panel: Recent Labs  Lab 06/21/23 2357 06/22/23 1600 06/22/23 2332 06/23/23 2349 06/25/23 0444 06/26/23 0411 06/26/23 1015  NA 129* 133* 131*  130* 130* 133*  --   K 3.8 3.5 3.8 3.8 3.9 3.7  --   CL 99  --  100 95* 96* 98  --   CO2 24  --  23 28 26 26   --   GLUCOSE 116*  --  136* 93 106* 88  --   BUN 9  --  11 18 14 12   --   CREATININE 0.66  --  0.74 0.89 0.68 0.64  --   CALCIUM 8.2*  --  8.5* 8.4* 8.2* 8.4*  --   MG 1.9  --   --   --   --   --  2.0    Liver Function Tests: No results for input(s): "AST", "ALT", "ALKPHOS", "BILITOT", "PROT", "ALBUMIN" in the last 168 hours.   Radiology Studies: DG Abd 2 Views  Result Date: 06/26/2023 CLINICAL DATA:  Constipation. EXAM: ABDOMEN - 2 VIEW  COMPARISON:  None Available. FINDINGS: Moderate stool throughout the colon. No bowel dilatation or evidence of obstruction. No free air. There is degenerative changes of the spine and scoliosis. No acute osseous pathology. Median sternotomy wires and aortic valve repair. IMPRESSION: Moderate colonic stool burden. No bowel obstruction. Electronically Signed   By: Elgie Collard M.D.   On: 06/26/2023 15:16      LOS: 6 days    Joycelyn Das, MD [facility] Available via Epic secure chat 7am-7pm After these hours, please refer to coverage provider listed on [email or web address] 06/27/2023, 11:27 AM

## 2023-06-27 NOTE — Plan of Care (Signed)
  Problem: Education: Goal: Knowledge of General Education information will improve Description Including pain rating scale, medication(s)/side effects and non-pharmacologic comfort measures Outcome: Progressing   

## 2023-06-29 ENCOUNTER — Telehealth: Payer: Self-pay | Admitting: Cardiovascular Disease

## 2023-06-29 NOTE — Telephone Encounter (Signed)
Called and adv patient that per KT she is able to remove her dressings.  She said she cannot remove them herself and is worried that it will hurt and wants to wait until her appointment on Monday.  Only her son in law and grandson are w her currently at her daughter's house.  I adv her it would be better if the bandage is removed so that she can shower and let water run over this area at some point before next Monday.  She went to [facility] and stayed only about 4 hours, she left AMA from there because "it was toxic there" and went to her daughter's house.  She wants to know if she can have home care come in.

## 2023-06-29 NOTE — Telephone Encounter (Signed)
Patient is wanting to know if she can leave her bandage on until her appointment on Monday. Patient would like [place] to help remove the bandages for her.

## 2023-06-30 ENCOUNTER — Telehealth: Payer: Self-pay | Admitting: Surgery

## 2023-06-30 NOTE — Telephone Encounter (Signed)
-----   Message from Emilie Rutter sent at 06/25/2023  7:14 AM EDT -----  2-3 weeks for incision check for PA.  PO R CFA thrombectomy and repair by [facility]. No studies. Thanks

## 2023-07-04 ENCOUNTER — Other Ambulatory Visit: Payer: Self-pay | Admitting: Cardiology

## 2023-07-04 ENCOUNTER — Ambulatory Visit: Payer: Medicare Other | Attending: Cardiovascular Disease | Admitting: Cardiology

## 2023-07-04 VITALS — BP 132/58 | HR 56 | Ht 62.0 in | Wt 125.0 lb

## 2023-07-04 DIAGNOSIS — I82401 Acute embolism and thrombosis of unspecified deep veins of right lower extremity: Secondary | ICD-10-CM | POA: Insufficient documentation

## 2023-07-04 DIAGNOSIS — I25119 Atherosclerotic heart disease of native coronary artery with unspecified angina pectoris: Secondary | ICD-10-CM | POA: Diagnosis not present

## 2023-07-04 DIAGNOSIS — M79661 Pain in right lower leg: Secondary | ICD-10-CM | POA: Diagnosis not present

## 2023-07-04 DIAGNOSIS — Z952 Presence of prosthetic heart valve: Secondary | ICD-10-CM | POA: Diagnosis not present

## 2023-07-04 DIAGNOSIS — I350 Nonrheumatic aortic (valve) stenosis: Secondary | ICD-10-CM | POA: Diagnosis not present

## 2023-07-04 NOTE — Patient Instructions (Addendum)
Medication Instructions:  Your physician has recommended you make the following change in your medication:   Take 1 and 1/2 Lasix tablets for 2 days.  *If you need a refill on your cardiac medications before your next appointment, please call your pharmacy*   Testing/Procedures: Ultra sound of lower right leg    Follow-Up: At [facility], you and your health needs are our priority.  As part of our continuing mission to provide you with exceptional heart care, we have created designated Provider Care Teams.  These Care Teams include your primary Cardiologist (physician) and Advanced Practice Providers (APPs -  Physician Assistants and Nurse Practitioners) who all work together to provide you with the care you need, when you need it.  We recommend signing up for the patient portal called "MyChart".  Sign up information is provided on this After Visit Summary.  MyChart is used to connect with patients for Virtual Visits (Telemedicine).  Patients are able to view lab/test results, encounter notes, upcoming appointments, etc.  Non-urgent messages can be sent to your provider as well.   To learn more about what you can do with MyChart, go to [email or web address].    Your next appointment:   As scheduled

## 2023-07-05 MED ORDER — DOXYCYCLINE HYCLATE 100 MG PO CAPS: 100.0000 mg | ORAL_CAPSULE | ORAL | 12 refills | Status: DC | PRN

## 2023-07-05 NOTE — Progress Notes (Signed)
[facility]   MULTIDISCIPLINARY HEART VALVE CLINIC                                     Cardiology Office Note:    Date:  07/05/2023   ID:  [name], [name] 06/05/1941, MRN [id number]  PCP:  Pincus Sanes, MD  [facility] Cardiologist:  Tonny Bollman, MD  [facility] Electrophysiologist:  None   Referring MD: Pincus Sanes, MD   Chief Complaint  Patient presents with   Follow-up    [facility] s/p TAVR   History of Present Illness:    [name] is a 82 y.o. female with a hx of CAD s/p CABG x5 2007, HTN, HLD, hypothyroidism, chronic diastolic HF, breast cancer s/p mastectomy, and severe aortic stenosis who is s/p TAVR 06/21/23 and is here today for TOC follow up.    [name] is followed by Dr [name] for her cardiology care. She has been noted to have progressive AS with last echo with an EF of 60% and mean aortic gradient of and with a valve area of 0.7cm2. She complained of tiredness that has been progressive and DOE. She was seen in the ER on 7/18 for worsening fatigue and concern for stroke. [facility] 06/09/23 showed severe triple vessel CAD s/p 5V CABG with 5/5 patent bypass grafts.    She was evaluated by the multidisciplinary valve team and felt to have severe, symptomatic aortic stenosis and to be a suitable candidate and is now s/p successful TAVR with a 20 mm Edwards Sapien 3 Ultra Resilia THV via the TF approach on 06/21/23. Post operative echo showed EF 65%, normally functioning TAVR with a mean gradient of 7 mmHg and no PVL. He unfortunately was found to have an ischemic right leg post TAVR and underwent femoral thrombectomy/endarterectomy with bovine pericardial patch angioplasty. She opted for discharge to [facility] for PT however left abruptly after 4 hours of being there. She has since been staying with her daughter who is also here with her today.   Since discharge she reports that she has been doing well from a CV standpoint with no chest pain, SOB,  palpitations, LE edema, orthopnea, PND, dizziness, or syncope. Denies bleeding in stool or urine. She has mild pain at the right groin site however site looks great with no evidence of redness, oozing, or bleeding. She has been having right mid calf pain/tenderness and R>L calf swelling. Area is warm touch, + pedal pulse, but is firm. She is asking about needing more PT and we discussed her participating on CRII as I think this would be beneficial for her. EKG today with bradycardia and no high grade HB noted.    Past Medical History:  Diagnosis Date   Allergy    SEASONAL   Anxiety    BREAST CANCER 1991   left, 1991; B mastectomy   CAD (coronary artery disease)    s/p CABG x5 in 2007   Cataract    BILATERAL-REMOVED   Depression    DIVERTICULOSIS    GERD    History of DVT (deep vein thrombosis)    HYPERLIPIDEMIA    Hypertension    HYPOTHYROIDISM    Occlusion and stenosis of carotid artery without mention of cerebral infarction    OSTEOPOROSIS    S/P TAVR (transcatheter aortic valve replacement) 06/21/2023   s/p TAVR with a 23mm Edwards S3UR  via the TF approach by Dr. Clifton James & Dr. Leafy Ro   Severe aortic stenosis     Past Surgical History:  Procedure Laterality Date   APPENDECTOMY     COLONOSCOPY     CORONARY ARTERY BYPASS GRAFT     5        2007   ENDARTERECTOMY FEMORAL Right 06/22/2023   Procedure: RIGHT FEMORAL ENDARTERECTOMY WITH PATCH ANGIOPLASTY;  Surgeon: Nada Libman, MD;  Location: [facility] OR;  Service: Vascular;  Laterality: Right;   FEMORAL-POPLITEAL BYPASS GRAFT Right 06/22/2023   Procedure: FEMORAL THROMBECTOMY;  Surgeon: Nada Libman, MD;  Location: [facility] OR;  Service: Vascular;  Laterality: Right;   INTRAOPERATIVE TRANSTHORACIC ECHOCARDIOGRAM N/A 06/21/2023   Procedure: INTRAOPERATIVE TRANSTHORACIC ECHOCARDIOGRAM;  Surgeon: Kathleene Hazel, MD;  Location: [facility] INVASIVE CV LAB;  Service: Open Heart Surgery;  Laterality: N/A;   MASTECTOMY     left with  reconstruction and lymph node excision  1992   MASTECTOMY     right with reconstruction   PATCH ANGIOPLASTY Right 06/22/2023   Procedure: PATCH ANGIOPLASTY OF RIGHT FEMORAL ARTERY USING BOVINE PERICARDIAL PATCH;  Surgeon: Nada Libman, MD;  Location: [facility] OR;  Service: Vascular;  Laterality: Right;   REVISION RECONSTRUCTED BREAST Left 02/2014   willard (plastics in [place])   right ankle arthroscopy     RIGHT/LEFT HEART CATH AND CORONARY ANGIOGRAPHY N/A 06/09/2023   Procedure: RIGHT/LEFT HEART CATH AND CORONARY ANGIOGRAPHY;  Surgeon: Kathleene Hazel, MD;  Location: [facility] INVASIVE CV LAB;  Service: Cardiovascular;  Laterality: N/A;   TEAR DUCT CANAL     TONSILLECTOMY AND ADENOIDECTOMY     TRANSCATHETER AORTIC VALVE REPLACEMENT, TRANSFEMORAL N/A 06/21/2023   Procedure: Transcatheter Aortic Valve Replacement, Transfemoral;  Surgeon: Kathleene Hazel, MD;  Location: [facility] INVASIVE CV LAB;  Service: Open Heart Surgery;  Laterality: N/A;   UPPER GASTROINTESTINAL ENDOSCOPY     VAGINAL HYSTERECTOMY     ovaries not excised    Current Medications: Current Meds  Medication Sig   aspirin 81 MG tablet Take 81 mg by mouth daily.     atorvastatin (LIPITOR) 20 MG tablet TAKE 1 TABLET BY MOUTH EVERY DAY   Calcium Carbonate (CALTRATE 600 PO) Take 1 tablet by mouth 2 (two) times daily.     citalopram (CELEXA) 20 MG tablet TAKE 1 TABLET BY MOUTH EVERY DAY   co-enzyme Q-10 30 MG capsule Take 30 mg by mouth daily.   famotidine (PEPCID) 40 MG tablet Take 1 tablet (40 mg total) by mouth daily.   fluticasone (FLONASE) 50 MCG/ACT nasal spray Place 1 spray into both nostrils daily as needed for allergies or rhinitis.   furosemide (LASIX) 20 MG tablet TAKE 1 TABLET BY MOUTH EVERY DAY   levothyroxine (SYNTHROID) 75 MCG tablet TAKE 1 TABLET BY MOUTH EVERY MORNING BEFORE BREAKFAST   losartan (COZAAR) 50 MG tablet TAKE 1 TABLET BY MOUTH EVERY DAY   meloxicam (MOBIC) 7.5 MG tablet TAKE 1/2 TO 1 TABLET EVERY DAY    metoprolol tartrate (LOPRESSOR) 25 MG tablet Take 1 tablet (25 mg total) by mouth 2 (two) times daily.   Multiple Vitamins-Minerals (MULTIVITAMIN) LIQD Take 1 tablet by mouth daily.   Multiple Vitamins-Minerals (PRESERVISION AREDS 2) CAPS Take 1 capsule by mouth 2 (two) times daily.   senna-docusate (SENOKOT-S) 8.6-50 MG per tablet Take 1 tablet by mouth daily.     VITAMIN D, CHOLECALCIFEROL, PO Take 1 tablet by mouth daily.   zolpidem (AMBIEN) 5 MG tablet Take 1  tablet (5 mg total) by mouth at bedtime as needed. for sleep (Patient taking differently: Take 5 mg by mouth at bedtime as needed for sleep.)     Allergies:   Clarithromycin, Clindamycin, Codeine, Levofloxacin, Morphine, Naproxen, Omeprazole, Penicillins, and Pneumococcal vaccines   Social History   Socioeconomic History   Marital status: Divorced    Spouse name: Not on file   Number of children: 2   Years of education: Not on file   Highest education level: Not on file  Occupational History   Occupation: retired  Tobacco Use   Smoking status: Never   Smokeless tobacco: Never  Vaping Use   Vaping status: Never Used  Substance and Sexual Activity   Alcohol use: No    Alcohol/week: 0.0 standard drinks of alcohol   Drug use: No   Sexual activity: Not Currently  Other Topics Concern   Not on file  Social History Narrative   Divorced x 3, lives alone   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, school psychologist   Right handed    Social Determinants of Health   Financial Resource Strain: Low Risk  (09/02/2022)   Overall Financial Resource Strain (CARDIA)    Difficulty of Paying Living Expenses: Not hard at all  Food Insecurity: No Food Insecurity (06/25/2023)   Hunger Vital Sign    Worried About Running Out of Food in the Last Year: Never true    Ran Out of Food in the Last Year: Never true  Transportation Needs: No Transportation Needs (06/25/2023)   PRAPARE - Administrator, Civil Service  (Medical): No    Lack of Transportation (Non-Medical): No  Physical Activity: Inactive (09/02/2022)   Exercise Vital Sign    Days of Exercise per Week: 0 days    Minutes of Exercise per Session: 0 min  Stress: No Stress Concern Present (09/02/2022)   [employer] of Occupational Health - Occupational Stress Questionnaire    Feeling of Stress : Not at all  Social Connections: Moderately Integrated (09/02/2022)   Social Connection and Isolation Panel [NHANES]    Frequency of Communication with Friends and Family: More than three times a week    Frequency of Social Gatherings with Friends and Family: More than three times a week    Attends Religious Services: More than 4 times per year    Active Member of [employer] or Organizations: Yes    Attends Engineer, structural: More than 4 times per year    Marital Status: Divorced    Family History: The patient's family history includes Breast cancer in her mother; Colon cancer in her sister and sister; Emphysema in her father; Lung disease in her mother. There is no history of Esophageal cancer, Rectal cancer, Stomach cancer, or Thyroid disease.  ROS:   Please see the history of present illness.    All other systems reviewed and are negative.  EKGs/Labs/Other Studies Reviewed:    The following studies were reviewed today:  Cardiac Studies & Procedures   CARDIAC CATHETERIZATION  CARDIAC CATHETERIZATION 06/09/2023  Narrative   Mid RCA lesion is 100% stenosed.   Prox Cx lesion is 99% stenosed.   Ost LAD to Prox LAD lesion is 90% stenosed.   SVG graft was visualized by angiography and is normal in caliber.   SVG graft was visualized by angiography and is normal in caliber.   LIMA graft was visualized by angiography and is normal in caliber.  Severe triple vessel CAD s/p  5V CABG with 5/5 patent bypass grafts Severe stenosis proximal LAD. Patent sequential vein graft to the LAD and Diagonal Severe proximal Circumflex stenosis.  Patent LIMA graft to the obtuse marginal branch Chronic total occlusion of the mid RCA. The PDA and posterolateral arteries fill from the sequential vein graft. Elevated right heart pressures RA 7 RV 47/7/9 PA 48/19 mean 35 PCWP 26   Recommendations: Continue workup for TAVR. Medical management of CAD  Findings Coronary Findings Diagnostic  Dominance: Right  Left Anterior Descending Ost LAD to Prox LAD lesion is 90% stenosed.  Left Circumflex Vessel is large. Prox Cx lesion is 99% stenosed.  Right Coronary Artery Vessel is large. Mid RCA lesion is 100% stenosed. The lesion is chronically occluded.  Sequential Saphenous Graft To 2nd RPL, RPDA SVG graft was visualized by angiography and is normal in caliber.  Sequential Saphenous Graft To 1st Diag, Mid LAD SVG graft was visualized by angiography and is normal in caliber.  LIMA LIMA Graft To 2nd Mrg LIMA graft was visualized by angiography and is normal in caliber.  Intervention  No interventions have been documented.   STRESS TESTS  MYOCARDIAL PERFUSION IMAGING 12/17/2021  Narrative   The study is normal. Findings are consistent with no prior ischemia and no prior myocardial infarction. The study is low risk.   No ST deviation was noted.   LV perfusion is normal. There is no evidence of ischemia. There is no evidence of infarction.   Left ventricular function is normal. Nuclear stress EF: 71 %. The left ventricular ejection fraction is hyperdynamic (>65%). End diastolic cavity size is normal. End systolic cavity size is normal.   Prior study available for comparison from 09/02/2017. No changes compared to prior study.   ECHOCARDIOGRAM  ECHOCARDIOGRAM COMPLETE 06/22/2023  Narrative ECHOCARDIOGRAM REPORT    Patient Name:   [name] Date of Exam: 06/22/2023 Medical Rec #:  [id number]     Height:       62.0 in Accession #:    [id number]    Weight:       129.6 lb Date of Birth:  1941/07/08      BSA:          1.590  m Patient Age:    82 years      BP:           102/52 mmHg Patient Gender: F             HR:           80 bpm. Exam Location:  Inpatient  Procedure: 2D Echo, Color Doppler and Cardiac Doppler  Indications:    Post TAVR z95.2  History:        Patient has prior history of Echocardiogram examinations, most recent 06/21/2023. CAD; Risk Factors:Hypertension and Dyslipidemia. 20mm Edwards S3U TAVR implanted 06/21/23.  Aortic Valve: 20 mm Edwards Ultra, stented (TAVR) valve is present in the aortic position. Procedure Date: 06/21/2023.  Sonographer:    Irving Burton Senior RDCS Referring Phys: [id number] Janetta Hora   Sonographer Comments: Very difficult windows due to thin body habitus and implants. IMPRESSIONS   1. Left ventricular ejection fraction, by estimation, is 65 to 70%. The left ventricle has normal function. The left ventricle has no regional wall motion abnormalities. Left ventricular diastolic parameters are consistent with Grade II diastolic dysfunction (pseudonormalization). Elevated left atrial pressure. 2. There appears to be a prominent muscular ridge (extension of the moderator band) in the mid right ventricle, separating the inflow  chanber from the infundibulum and causing flow acceleration with a "gradient" of approximately 1.6 m/s. This was present on the pre-procedural images performed before TAVR deployment. The accelerated flow mimics the flow of a membranoud VSD, but the velocity is low and there is no evidence of increased flow across the RVOT. There is no evidence of VSD. Right ventricular systolic function is hyperdynamic. The right ventricular size is normal. Tricuspid regurgitation signal is inadequate for assessing PA pressure. 3. Left atrial size was moderately dilated. 4. The mitral valve is normal in structure. Moderate mitral valve regurgitation. Moderate mitral annular calcification. 5. The aortic valve has been repaired/replaced. Aortic valve regurgitation is not  visualized. No aortic stenosis is present. There is a 20 mm Edwards Ultra, stented (TAVR) valve present in the aortic position. Procedure Date: 06/21/2023. Echo findings are consistent with normal structure and function of the aortic valve prosthesis. Aortic valve mean gradient measures 7.0 mmHg. Aortic valve Vmax measures 1.66 m/s. Aortic valve acceleration time measures 108 msec.  FINDINGS Left Ventricle: Left ventricular ejection fraction, by estimation, is 65 to 70%. The left ventricle has normal function. The left ventricle has no regional wall motion abnormalities. The left ventricular internal cavity size was normal in size. There is borderline concentric left ventricular hypertrophy. Left ventricular diastolic parameters are consistent with Grade II diastolic dysfunction (pseudonormalization). Elevated left atrial pressure.  Right Ventricle: There appears to be a prominent muscular ridge (extension of the moderator band) in the mid right ventricle, separating the inflow chanber from the infundibulum and causing flow acceleration with a "gradient" of approximately 1.6 m/s. This was present on the pre-procedural images performed before TAVR deployment. The accelerated flow mimics the flow of a membranoud VSD, but the velocity is low and there is no evidence of increased flow across the RVOT. There is no evidence of VSD. The right ventricular size is normal. No increase in right ventricular wall thickness. Right ventricular systolic function is hyperdynamic. Tricuspid regurgitation signal is inadequate for assessing PA pressure.  Left Atrium: Left atrial size was moderately dilated.  Right Atrium: Right atrial size was normal in size.  Pericardium: There is no evidence of pericardial effusion.  Mitral Valve: The mitral valve is normal in structure. There is mild thickening of the mitral valve leaflet(s). There is mild calcification of the mitral valve leaflet(s). Moderate mitral annular  calcification. Moderate mitral valve regurgitation. MV peak gradient, 6.6 mmHg. The mean mitral valve gradient is 3.0 mmHg.  Tricuspid Valve: The tricuspid valve is normal in structure. Tricuspid valve regurgitation is not demonstrated.  Aortic Valve: The aortic valve has been repaired/replaced. Aortic valve regurgitation is not visualized. No aortic stenosis is present. Aortic valve mean gradient measures 7.0 mmHg. Aortic valve peak gradient measures 11.0 mmHg. Aortic valve area, by VTI measures 2.17 cm. There is a 20 mm Edwards Ultra, stented (TAVR) valve present in the aortic position. Procedure Date: 06/21/2023. Echo findings are consistent with normal structure and function of the aortic valve prosthesis.  Pulmonic Valve: The pulmonic valve was grossly normal. Pulmonic valve regurgitation is mild to moderate.  Aorta: The aortic root is normal in size and structure.  IAS/Shunts: No atrial level shunt detected by color flow Doppler. No ventricular septal defect is seen or detected.   LEFT VENTRICLE PLAX 2D LVIDd:         2.90 cm   Diastology LVIDs:         2.00 cm   LV e' medial:    5.22 cm/s  LV PW:         1.00 cm   LV E/e' medial:  24.3 LV IVS:        1.20 cm   LV e' lateral:   6.20 cm/s LVOT diam:     1.90 cm   LV E/e' lateral: 20.5 LV SV:         65 LV SV Index:   41 LVOT Area:     2.84 cm   RIGHT VENTRICLE RV S prime:     10.20 cm/s TAPSE (M-mode): 2.1 cm  LEFT ATRIUM           Index        RIGHT ATRIUM           Index LA diam:      3.30 cm 2.08 cm/m   RA Area:     19.40 cm LA Vol (A4C): 54.4 ml 34.22 ml/m  RA Volume:   54.90 ml  34.53 ml/m AORTIC VALVE AV Area (Vmax):    1.95 cm AV Area (Vmean):   1.90 cm AV Area (VTI):     2.17 cm AV Vmax:           166.00 cm/s AV Vmean:          133.000 cm/s AV VTI:            0.299 m AV Peak Grad:      11.0 mmHg AV Mean Grad:      7.0 mmHg LVOT Vmax:         114.00 cm/s LVOT Vmean:        89.100 cm/s LVOT VTI:           0.229 m LVOT/AV VTI ratio: 0.77  AORTA Ao Root diam: 3.20 cm Ao Asc diam:  3.30 cm  MITRAL VALVE MV Area (PHT): 1.93 cm     SHUNTS MV Area VTI:   1.75 cm     Systemic VTI:  0.23 m MV Peak grad:  6.6 mmHg     Systemic Diam: 1.90 cm MV Mean grad:  3.0 mmHg MV Vmax:       1.28 m/s MV Vmean:      86.1 cm/s MV Decel Time: 393 msec MV E velocity: 127.00 cm/s MV A velocity: 96.40 cm/s MV E/A ratio:  1.32  Mihai Croitoru MD Electronically signed by Thurmon Fair MD Signature Date/Time: 06/22/2023/12:38:52 PM    Final     CT SCANS  CT CORONARY MORPH W/CTA COR W/SCORE 03/08/2023  Addendum 03/08/2023  1:17 PM ADDENDUM REPORT: 03/08/2023 13:14  CLINICAL DATA:  Aortic stenosis  EXAM: Cardiac TAVR CT  TECHNIQUE: The patient was scanned on a Siemens Force 192 slice scanner. A 120 kV retrospective scan was triggered in the descending thoracic aorta at 111 HU's. Gantry rotation speed was 270 msecs and collimation was .9 mm. No beta blockade or nitro were given. The 3D data set was reconstructed in 5% intervals of the R-R cycle. Systolic and diastolic phases were analyzed on a dedicated work station using MPR, MIP and VRT modes. The patient received 80 cc of contrast.  FINDINGS: Aortic Valve: Functionally bicuspid with fused right and left cusp AV with calcium score 874  Aorta: No aneurysm normal arch vessels moderate calcific atherosclerosis  Sinotubular Junction: 26.4 mm  Ascending Thoracic Aorta: 34 mm  Aortic Arch: 25 mm  Descending Thoracic Aorta: 24 mm  Sinus of Valsalva Measurements:  Non-coronary: 31.4 mm  Height 23.1 mm  Right - coronary: 28.6  mm  Height 16.2 mm  Left - coronary: 30.8 mm  Height 18.4 mm  Coronary Artery Height above Annulus:  Left Main: 13.9 mm above annulus  Right Coronary: 9.3 mm above annulus  Virtual Basal Annulus Measurements:  Maximum/Minimum Diameter: 23.3 mm x 18.6 mm Average diameter 21.2 mm  Perimeter: 67.7 mm  Area:  353 mm2  Coronary Arteries: Sufficient height above annulus for deployment and patient has by pass grafts  Optimum Fluoroscopic Angle for Delivery: LAO 7 Caudal 6 degrees  IMPRESSION: 1. Functionally bicuspid AV with fused right and left cusp score 874  2. Annular area of 353 mm2 suitable for a 23 mm Sapien 3 valve Alternatively a 26 Medtronic Evolut valve would fit  3.  Optimum angiographic angle for deployment LAO 7 Caudal 6 degrees  4.  Coronary arteries sufficient height above annulus for deployment  5.  There are 2 patent SVGls and a patent LIMA  6.  Membranous septal length 9.5 mm  Charlton Haws   Electronically Signed By: Charlton Haws M.D. On: 03/08/2023 13:14  Narrative EXAM: OVER-READ INTERPRETATION  CT CHEST  The following report is a limited chest CT over-read performed by radiologist Dr. Allegra Lai of [facility] on 03/08/2023. This over-read does not include interpretation of cardiac or coronary anatomy or pathology. The cardiac TAVR interpretation by the cardiologist is attached.  COMPARISON:  None Available.  FINDINGS: Extracardiac findings will be described separately under dictation for contemporaneously obtained CTA chest, abdomen and pelvis.  IMPRESSION: Please see separate dictation for contemporaneously obtained CTA chest, abdomen and pelvis dated 03/08/2023 for full description of relevant extracardiac findings.  Electronically Signed: By: Allegra Lai M.D. On: 03/08/2023 11:53          EKG:  EKG is ordered today.  The ekg ordered today demonstrates SB with HR 55bpm No change from prior tracing.   Recent Labs: 08/04/2022: Pro B Natriuretic peptide (BNP) 232.0 06/02/2023: B Natriuretic Peptide 224.4; TSH 2.471 06/17/2023: ALT 20 06/26/2023: BUN 12; Creatinine, Ser 0.64; Hemoglobin 10.9; Magnesium 2.0; Platelets 168; Potassium 3.7; Sodium 133   Recent Lipid Panel    Component Value Date/Time   CHOL 132 02/02/2023  1523   CHOL 134 07/28/2018 1153   TRIG 77.0 02/02/2023 1523   HDL 55.40 02/02/2023 1523   HDL 52 07/28/2018 1153   CHOLHDL 2 02/02/2023 1523   VLDL 15.4 02/02/2023 1523   LDLCALC 61 02/02/2023 1523   LDLCALC 71 07/30/2020 1631    Physical Exam:    VS:  BP (!) 132/58   Pulse (!) 56   Ht 5\' 2"  (1.575 m)   Wt 125 lb (56.7 kg)   BMI 22.86 kg/m     Wt Readings from Last 3 Encounters:  07/04/23 125 lb (56.7 kg)  06/25/23 134 lb 14.7 oz (61.2 kg)  06/13/23 124 lb (56.2 kg)    General: Well developed, well nourished, NAD Neck: Negative for carotid bruits. No JVD Lungs:Clear to ausculation bilaterally. No wheezes, rales, or rhonchi. Breathing is unlabored. Cardiovascular: RRR with S1 S2. Soft flow murmur Extremities: R>L mid calf and ankle edema. + pedal pulse.  Neuro: Alert and oriented. No focal deficits. No facial asymmetry. MAE spontaneously. Psych: Responds to questions appropriately with normal affect.    ASSESSMENT/PLAN:    Severe AS: Patient doing well with NYHA class I symptoms s/p successful TAVR with a 20 mm Edwards Sapien 3 Ultra Resilia THV via the TF approach on 06/21/23. Post operative echo showed EF  65%, normally functioning TAVR with a mean gradient of 7 mmHg and no PVL. EKG with SB and no high grade AV block noted. Right groin surgical site healing well with great border approximation and no redness or oozing. Continue ASA 81mg  every day. She will require lifelong dental SBE although with her allergy to PCN and azithromycon, plan to use Doxycycline 100mg  for SBE. She will return for one month follow up with repeat echo.    Ischemic right leg/R calf swelling: s/p femoral thrombectomy/endarterectomy with bovine pericardial patch angioplasty. Incision looks great with no redness or oozing. Mild pain with ambulation. She does have R>L calf edema with pain therefore will obtain LE doppler to r/o DVT given recent hospitalization and vascular surgery. Patient to see [facility] next week  for f/u.      CAD s/p CABG: pre TAVR cath showed triple vessel CAD s/p 5V CABG with 5/5 patent bypass grafts. Continue medical therapy. Denies anginal symptoms.    HTN: Stable today with no changes needed at this time.    Chronic diastolic CHF: Appears euvolemic. With mild ankle edema, will increase to 30mg  x2 days (1.5 tabs) then resume 20mg  daily thereafter.     Medication Adjustments/Labs and Tests Ordered: Current medicines are reviewed at length with the patient today.  Concerns regarding medicines are outlined above.  Orders Placed This Encounter  Procedures   EKG 12-Lead   VAS [place] LOWER EXTREMITY VENOUS (DVT)   No orders of the defined types were placed in this encounter.   Patient Instructions  Medication Instructions:  Your physician has recommended you make the following change in your medication:   Take 1 and 1/2 Lasix tablets for 2 days.  *If you need a refill on your cardiac medications before your next appointment, please call your pharmacy*   Testing/Procedures: Ultra sound of lower right leg    Follow-Up: At [facility], you and your health needs are our priority.  As part of our continuing mission to provide you with exceptional heart care, we have created designated Provider Care Teams.  These Care Teams include your primary Cardiologist (physician) and Advanced Practice Providers (APPs -  Physician Assistants and Nurse Practitioners) who all work together to provide you with the care you need, when you need it.  We recommend signing up for the patient portal called "MyChart".  Sign up information is provided on this After Visit Summary.  MyChart is used to connect with patients for Virtual Visits (Telemedicine).  Patients are able to view lab/test results, encounter notes, upcoming appointments, etc.  Non-urgent messages can be sent to your provider as well.   To learn more about what you can do with MyChart, go to [email or web address].    Your  next appointment:   As scheduled   Signed, Georgie Chard, NP  07/05/2023 7:53 AM    [facility]

## 2023-07-07 ENCOUNTER — Telehealth: Payer: Self-pay | Admitting: Cardiology

## 2023-07-07 ENCOUNTER — Ambulatory Visit (HOSPITAL_COMMUNITY)
Admission: RE | Admit: 2023-07-07 | Discharge: 2023-07-07 | Disposition: A | Discharge status: Home / Self Care | Payer: Medicare Other | Source: Ambulatory Visit | Attending: Cardiovascular Disease | Admitting: Cardiovascular Disease

## 2023-07-07 DIAGNOSIS — I82401 Acute embolism and thrombosis of unspecified deep veins of right lower extremity: Secondary | ICD-10-CM | POA: Diagnosis not present

## 2023-07-07 DIAGNOSIS — I25119 Atherosclerotic heart disease of native coronary artery with unspecified angina pectoris: Secondary | ICD-10-CM | POA: Insufficient documentation

## 2023-07-07 NOTE — Telephone Encounter (Signed)
Pt is requesting a callback regarding her trying to find out the name of the place she was referred to and the location because she hasn't been contacted yet so she'd concerned. Please advise.

## 2023-07-07 NOTE — Telephone Encounter (Signed)
Returned pt.'s call. Left message to call back. 

## 2023-07-12 NOTE — Telephone Encounter (Signed)
Spoke with patient and she is aware cardiac rehab will give her a call. They are a little behind with scheduling

## 2023-07-12 NOTE — Telephone Encounter (Signed)
Patient states at her list OV she thought you were placing a referral for rehab. She states you gave her the address of where it was located and stated after she heal, she should start rehab. I do not see mention of rehab in your note. I also did not see a referral placed for rehab

## 2023-07-12 NOTE — Telephone Encounter (Signed)
Patient is returning phone call.  °

## 2023-07-22 NOTE — Progress Notes (Addendum)
[facility]   MULTIDISCIPLINARY HEART VALVE TEAM  Structural Heart Office Note:  .   Date:  07/27/2023  ID:  [name], DOB December 24, 1940, MRN [id number] PCP: Pincus Sanes, MD  [facility] Providers Cardiologist:  Tonny Bollman, MD  1}   History of Present Illness: .   [name] is a 82 y.o. female with a hx of CAD s/p CABG x5 2007, HTN, HLD, hypothyroidism, chronic diastolic HF, breast cancer s/p mastectomy, and severe aortic stenosis who is s/p TAVR 06/21/23 and is here today for one month follow up.    [name] is followed by Dr [name] for her cardiology care. She has been noted to have progressive AS with last echo with an EF of 60% and mean aortic gradient of and with a valve area of 0.7cm2. She complained of tiredness that has been progressive and DOE. She was seen in the ER on 7/18 for worsening fatigue and concern for stroke. [name] 06/09/23 showed severe triple vessel CAD s/p 5V CABG with 5/5 patent bypass grafts.    She was evaluated by the multidisciplinary valve team and felt to have severe, symptomatic aortic stenosis and to be a suitable candidate and is now s/p successful TAVR with a 20 mm Edwards Sapien 3 Ultra Resilia THV via the TF approach on 06/21/23. Post operative echo showed EF 65%, normally functioning TAVR with a mean gradient of 7 mmHg and no PVL. He unfortunately was found to have an ischemic right leg post TAVR and underwent femoral thrombectomy/endarterectomy with bovine pericardial patch angioplasty. She opted for discharge to [facility] for PT however left abruptly after 4 hours of being there. She has since been staying with her daughter who is also here with her today.    Since discharge she reports that she has been doing well from a CV standpoint with no chest pain, SOB, palpitations, LE edema, orthopnea, PND, dizziness, or syncope. Denies bleeding in stool or urine. At [facility] follow up she had mild right leg pain and was sent for [place]  that was negative for any acute abnormalities. Today she is here alone and reports that she continues to do well with breathing and fatigue as before but still has issues with her right leg. Mostly she is concerned about numbness and tingling in both her upper and lower extremity. It continues to be more swollen than her left foot and she is having to wear shoes that stretch. Edema does not appear to be significant to me and she reports the 3 days increase in dosing from last appointment did not help. She follows with [facility] on 9/16.      Physical Exam:    VS:  BP 138/72 (BP Location: Right Arm, Patient Position: Sitting, Cuff Size: Small)   Pulse (!) 55   Ht 5\' 2"  (1.575 m)   Wt 127 lb (57.6 kg)   BMI 23.23 kg/m    Wt Readings from Last 3 Encounters:  07/27/23 127 lb 6.4 oz (57.8 kg)  07/25/23 127 lb (57.6 kg)  07/04/23 125 lb (56.7 kg)    General: Well developed, well nourished, NAD Lungs:Clear to ausculation bilaterally. No wheezes, rales, or rhonchi. Breathing is unlabored. Cardiovascular: RRR with S1 S2. No murmurs Extremities: Trace ankle edema. + right DP pulse with good color, temp throughout entire leg. No cyanosis.  Neuro: Alert and oriented. No focal deficits. No facial asymmetry. MAE spontaneously. Psych: Responds to questions appropriately with normal affect.  ASSESSMENT AND PLAN: .    Severe AS: Patient doing well with NYHA class I symptoms s/p successful TAVR with a 20 mm Edwards Sapien 3 Ultra Resilia THV via the TF approach on 06/21/23. Echo today with normal LV and stable TAVR with a mean gradient at and peak with an AVA 1.74cm2. Continue ASA 81mg  every day. She will require lifelong dental SBE although with her allergy to PCN and azithromycon, plan to use Doxycycline 100mg  for SBE. Plan regular follow up with Dr. [name] and structural OV with echo in one year.    Ischemic right leg/R calf swelling: s/p femoral thrombectomy/endarterectomy with bovine pericardial  patch angioplasty. Continues to have unilateral pain in her right leg with intermittent numbness and tingling. + DP pulse with good color and temperature. Mild ankle edema. Prior LE [place] negative for acute changes. Plan right LE arterial doppler scheduled for 9/13. She sees [facility] on 9/16.      CAD s/p CABG: pre TAVR cath showed triple vessel CAD s/p 5V CABG with 5/5 patent bypass grafts. Continue medical therapy. Denies anginal symptoms.    HTN: Stable today with no changes needed at this time.    Chronic diastolic CHF: Appears euvolemic. Continue current lasix regimen.    Incidental findings: Solid pulmonary nodules, largest is a 6 mm nodule of the right middle lobe. Non-contrast chest CT at 6-12 months recommended  recommended. If the nodule is stable at time of repeat CT, then future CT at 18-24 months (from today's scan) is considered optional for low-risk patients, but is recommended for high-risk patients. CT ordered by PCP and is scheduled for 9/20.   Signed, Georgie Chard, NP

## 2023-07-25 ENCOUNTER — Ambulatory Visit (HOSPITAL_COMMUNITY): Payer: Medicare Other | Attending: Internal Medicine

## 2023-07-25 ENCOUNTER — Ambulatory Visit (INDEPENDENT_AMBULATORY_CARE_PROVIDER_SITE_OTHER): Payer: Medicare Other | Admitting: Cardiology

## 2023-07-25 ENCOUNTER — Telehealth: Payer: Self-pay | Admitting: Cardiovascular Disease

## 2023-07-25 VITALS — BP 138/72 | HR 55 | Ht 62.0 in | Wt 127.0 lb

## 2023-07-25 DIAGNOSIS — M79604 Pain in right leg: Secondary | ICD-10-CM

## 2023-07-25 DIAGNOSIS — I10 Essential (primary) hypertension: Secondary | ICD-10-CM | POA: Diagnosis not present

## 2023-07-25 DIAGNOSIS — I25119 Atherosclerotic heart disease of native coronary artery with unspecified angina pectoris: Secondary | ICD-10-CM

## 2023-07-25 DIAGNOSIS — I5032 Chronic diastolic (congestive) heart failure: Secondary | ICD-10-CM | POA: Diagnosis not present

## 2023-07-25 DIAGNOSIS — I350 Nonrheumatic aortic (valve) stenosis: Secondary | ICD-10-CM

## 2023-07-25 DIAGNOSIS — Z952 Presence of prosthetic heart valve: Secondary | ICD-10-CM

## 2023-07-25 LAB — ECHOCARDIOGRAM COMPLETE
AR max vel: 1.55 cm2
AV Area VTI: 1.74 cm2
AV Area mean vel: 1.59 cm2
AV Mean grad: 9 mmHg
AV Peak grad: 16.8 mmHg
Ao pk vel: 2.05 m/s
Area-P 1/2: 2.95 cm2
S' Lateral: 2.3 cm

## 2023-07-25 NOTE — Telephone Encounter (Signed)
Pt wants to speak with Noreene Larsson about scheduling an ultra sound on a different date for it to be sent Dr. Sabino Donovan. Please advise

## 2023-07-25 NOTE — Patient Instructions (Addendum)
Medication Instructions:  Your physician recommends that you continue on your current medications as directed. Please refer to the Current Medication list given to you today.  *If you need a refill on your cardiac medications before your next appointment, please call your pharmacy*  Lab Work: If you have labs (blood work) drawn today and your tests are completely normal, you will receive your results only by: MyChart Message (if you have MyChart) OR A paper copy in the mail If you have any lab test that is abnormal or we need to change your treatment, we will call you to review the results.  Testing/Procedures: Your physician has requested that you have a lower extremity arterial duplex as soon as possible. This test is an ultrasound of the arteries in the legs. It looks at arterial blood flow in the legs. Allow one hour for Lower Arterial scans. There are no restrictions or special instructions   Follow-Up: At [facility], you and your health needs are our priority.  As part of our continuing mission to provide you with exceptional heart care, we have created designated Provider Care Teams.  These Care Teams include your primary Cardiologist (physician) and Advanced Practice Providers (APPs -  Physician Assistants and Nurse Practitioners) who all work together to provide you with the care you need, when you need it.  We recommend signing up for the patient portal called "MyChart".  Sign up information is provided on this After Visit Summary.  MyChart is used to connect with patients for Virtual Visits (Telemedicine).  Patients are able to view lab/test results, encounter notes, upcoming appointments, etc.  Non-urgent messages can be sent to your provider as well.   To learn more about what you can do with MyChart, go to [email or web address].    Your next appointment:   06/27/24 at 1:45 pm for echocardiogram before office visit.  Provider:   Structural Team

## 2023-07-26 ENCOUNTER — Telehealth: Payer: Self-pay | Admitting: Cardiovascular Disease

## 2023-07-26 ENCOUNTER — Encounter: Payer: Self-pay | Admitting: Internal Medicine

## 2023-07-26 NOTE — Telephone Encounter (Signed)
Results will be available on 07/30/23 as test is scheduled for 07/29/23. Patient made aware.

## 2023-07-26 NOTE — Patient Instructions (Addendum)
    Flu immunization administered today.      Medications changes include :  none     An ultrasound of your right groin and a Ct scan of your lungs was ordered and someone will call you to schedule each appointment.     Return in about 6 months (around 01/24/2024) for follow up.

## 2023-07-26 NOTE — Telephone Encounter (Signed)
Patient wants a call back to confirm her LE ARTERIAL test results will be available for her f/u visit on 9/16.

## 2023-07-26 NOTE — Progress Notes (Unsigned)
Subjective:    Patient ID: [name], female    DOB: 1941/05/18, 82 y.o.   MRN: [id number]     HPI [name] is here for follow up of her chronic medical problems.   S/p TAVR in August - complicated by femoral clot s/p thrombectomy  Had [place] of LE's - also noted -- - A non-vascular, anechoic structure is visualized in the right groin, anterior to the common femoral artery and vein.   Swelling in right foot, feels numb, hurts and is purplish at the base of the toes.  The swelling goes down a little over night.   No energy.  Not much improvement since being home from the hospital.    Medications and allergies reviewed with patient and updated if appropriate.  Current Outpatient Medications on File Prior to Visit  Medication Sig Dispense Refill   aspirin 81 MG tablet Take 81 mg by mouth daily.       atorvastatin (LIPITOR) 20 MG tablet TAKE 1 TABLET BY MOUTH EVERY DAY 90 tablet 1   Calcium Carbonate (CALTRATE 600 PO) Take 1 tablet by mouth 2 (two) times daily.       citalopram (CELEXA) 20 MG tablet TAKE 1 TABLET BY MOUTH EVERY DAY 90 tablet 2   co-enzyme Q-10 30 MG capsule Take 30 mg by mouth daily.     doxycycline (VIBRAMYCIN) 100 MG capsule Take 1 capsule (100 mg total) by mouth as needed (Take two tablets by mouth one hour prior to dental appointments). 10 capsule 12   famotidine (PEPCID) 40 MG tablet Take 1 tablet (40 mg total) by mouth daily. 90 tablet 1   fluticasone (FLONASE) 50 MCG/ACT nasal spray Place 1 spray into both nostrils daily as needed for allergies or rhinitis. 9.9 mL 5   furosemide (LASIX) 20 MG tablet TAKE 1 TABLET BY MOUTH EVERY DAY 90 tablet 0   levothyroxine (SYNTHROID) 75 MCG tablet TAKE 1 TABLET BY MOUTH EVERY MORNING BEFORE BREAKFAST 90 tablet 1   losartan (COZAAR) 50 MG tablet TAKE 1 TABLET BY MOUTH EVERY DAY 90 tablet 3   meloxicam (MOBIC) 7.5 MG tablet TAKE 1/2 TO 1 TABLET EVERY DAY 90 tablet 0   metoprolol tartrate (LOPRESSOR) 25 MG tablet Take 1  tablet (25 mg total) by mouth 2 (two) times daily. 180 tablet 1   Multiple Vitamins-Minerals (MULTIVITAMIN) LIQD Take 1 tablet by mouth daily.     Multiple Vitamins-Minerals (PRESERVISION AREDS 2) CAPS Take 1 capsule by mouth 2 (two) times daily.     senna-docusate (SENOKOT-S) 8.6-50 MG per tablet Take 1 tablet by mouth daily.       VITAMIN D, CHOLECALCIFEROL, PO Take 1 tablet by mouth daily.     zolpidem (AMBIEN) 5 MG tablet Take 1 tablet (5 mg total) by mouth at bedtime as needed. for sleep 30 tablet 5   No current facility-administered medications on file prior to visit.     Review of Systems  Constitutional:  Positive for fatigue. Negative for appetite change and fever.  Respiratory:  Positive for shortness of breath (with exertion). Negative for cough and wheezing.   Cardiovascular:  Positive for leg swelling (right foot > left foot). Negative for chest pain and palpitations.  Gastrointestinal:  Negative for abdominal pain, constipation and diarrhea.  Neurological:  Positive for light-headedness (when stands), numbness (right foot) and headaches (sometimes head hurts in left anterior upper head - few minutes).       Objective:   Vitals:  07/27/23 1404  BP: 130/62  Pulse: 62  Temp: 97.9 F (36.6 C)  SpO2: 95%   BP Readings from Last 3 Encounters:  07/27/23 130/62  07/25/23 138/72  07/04/23 (!) 132/58   Wt Readings from Last 3 Encounters:  07/27/23 127 lb 6.4 oz (57.8 kg)  07/25/23 127 lb (57.6 kg)  07/04/23 125 lb (56.7 kg)   Body mass index is 23.3 kg/m.    Physical Exam Constitutional:      General: She is not in acute distress.    Appearance: Normal appearance.  HENT:     Head: Normocephalic and atraumatic.  Eyes:     Conjunctiva/sclera: Conjunctivae normal.  Cardiovascular:     Rate and Rhythm: Normal rate and regular rhythm.     Heart sounds: Murmur (3/6 systolic) heard.  Pulmonary:     Effort: Pulmonary effort is normal. No respiratory distress.      Breath sounds: Normal breath sounds. No wheezing.  Musculoskeletal:     Cervical back: Neck supple.     Right lower leg: Edema (mild dorsal foot edema) present.     Left lower leg: Edema (trace) present.  Lymphadenopathy:     Cervical: No cervical adenopathy.  Skin:    General: Skin is warm and dry.     Findings: No rash.  Neurological:     Mental Status: She is alert. Mental status is at baseline.  Psychiatric:        Mood and Affect: Mood normal.        Behavior: Behavior normal.        Lab Results  Component Value Date   WBC 8.4 06/26/2023   HGB 10.9 (L) 06/26/2023   HCT 33.0 (L) 06/26/2023   PLT 168 06/26/2023   GLUCOSE 88 06/26/2023   CHOL 132 02/02/2023   TRIG 77.0 02/02/2023   HDL 55.40 02/02/2023   LDLCALC 61 02/02/2023   ALT 20 06/17/2023   AST 27 06/17/2023   NA 133 (L) 06/26/2023   K 3.7 06/26/2023   CL 98 06/26/2023   CREATININE 0.64 06/26/2023   BUN 12 06/26/2023   CO2 26 06/26/2023   TSH 2.471 06/02/2023   INR 1.1 06/17/2023   HGBA1C 5.9 02/02/2023     Assessment & Plan:    See Problem List for Assessment and Plan of chronic medical problems.

## 2023-07-27 ENCOUNTER — Encounter: Payer: Self-pay | Admitting: Internal Medicine

## 2023-07-27 ENCOUNTER — Ambulatory Visit (INDEPENDENT_AMBULATORY_CARE_PROVIDER_SITE_OTHER): Payer: Medicare Other | Admitting: Internal Medicine

## 2023-07-27 VITALS — BP 130/62 | HR 62 | Temp 97.9°F | Ht 62.0 in | Wt 127.4 lb

## 2023-07-27 DIAGNOSIS — R5383 Other fatigue: Secondary | ICD-10-CM

## 2023-07-27 DIAGNOSIS — K219 Gastro-esophageal reflux disease without esophagitis: Secondary | ICD-10-CM

## 2023-07-27 DIAGNOSIS — G4709 Other insomnia: Secondary | ICD-10-CM

## 2023-07-27 DIAGNOSIS — R7303 Prediabetes: Secondary | ICD-10-CM | POA: Diagnosis not present

## 2023-07-27 DIAGNOSIS — E7849 Other hyperlipidemia: Secondary | ICD-10-CM

## 2023-07-27 DIAGNOSIS — R9389 Abnormal findings on diagnostic imaging of other specified body structures: Secondary | ICD-10-CM | POA: Diagnosis not present

## 2023-07-27 DIAGNOSIS — I10 Essential (primary) hypertension: Secondary | ICD-10-CM | POA: Diagnosis not present

## 2023-07-27 DIAGNOSIS — Z23 Encounter for immunization: Secondary | ICD-10-CM

## 2023-07-27 DIAGNOSIS — F32A Depression, unspecified: Secondary | ICD-10-CM

## 2023-07-27 DIAGNOSIS — E039 Hypothyroidism, unspecified: Secondary | ICD-10-CM

## 2023-07-27 DIAGNOSIS — R911 Solitary pulmonary nodule: Secondary | ICD-10-CM

## 2023-07-27 DIAGNOSIS — F419 Anxiety disorder, unspecified: Secondary | ICD-10-CM

## 2023-07-27 NOTE — Assessment & Plan Note (Signed)
Chronic Encouraged trying to increase her activity Healthy diet Continue atorvastatin 20 mg daily

## 2023-07-27 NOTE — Assessment & Plan Note (Signed)
Chronic Lab Results  Component Value Date   HGBA1C 5.9 02/02/2023    Low sugar / carb diet Stressed regular exercise

## 2023-07-27 NOTE — Assessment & Plan Note (Signed)
Chronic Anxiety is controlled Continue citalopram to 20 mg daily

## 2023-07-27 NOTE — Assessment & Plan Note (Signed)
Chronic GERD controlled Continue Pepcid 40 mg daily  

## 2023-07-27 NOTE — Assessment & Plan Note (Signed)
Chronic Controlled, stable Continue ambien 5 mg nightly prn

## 2023-07-27 NOTE — Assessment & Plan Note (Signed)
Has been experiencing fatigue since her hospitalization that has not been improving She states she will be referred for cardiac rehab at some point soon, which I think will be very helpful Encouraged her to be as active as possible around her house

## 2023-07-27 NOTE — Assessment & Plan Note (Signed)
CT scan from April 2024 showed 6 mm nodule in right midlung Follow-up recommended CT scan ordered to be done sometime between October-April which would be 6-50-month follow-up

## 2023-07-27 NOTE — Assessment & Plan Note (Signed)
Chronic  Clinically euthyroid Last TSH within normal range Currently taking levothyroxine 75 mcg daily

## 2023-07-27 NOTE — Assessment & Plan Note (Signed)
Acute Had recent vascular right lower extremity ultrasound in the hospital that showed an abnormality in the right groin-nonvascular lesion that should be followed up Ultrasound of right groin ordered-advised that she can have this done when she is able to

## 2023-07-27 NOTE — Assessment & Plan Note (Signed)
Chronic Blood pressure is controlled  Continue losartan 50 mg daily, metoprolol 25 mg twice daily

## 2023-07-28 ENCOUNTER — Other Ambulatory Visit: Payer: Self-pay | Admitting: Internal Medicine

## 2023-07-28 NOTE — Telephone Encounter (Signed)
Left message to call office

## 2023-07-29 ENCOUNTER — Other Ambulatory Visit: Payer: Self-pay

## 2023-07-29 ENCOUNTER — Ambulatory Visit (HOSPITAL_COMMUNITY)
Admission: RE | Admit: 2023-07-29 | Discharge: 2023-07-29 | Disposition: A | Discharge status: Home / Self Care | Payer: Medicare Other | Source: Ambulatory Visit | Attending: Cardiology | Admitting: Cardiology

## 2023-07-29 ENCOUNTER — Other Ambulatory Visit: Payer: Self-pay | Admitting: Cardiology

## 2023-07-29 DIAGNOSIS — I10 Essential (primary) hypertension: Secondary | ICD-10-CM

## 2023-07-29 DIAGNOSIS — R911 Solitary pulmonary nodule: Secondary | ICD-10-CM

## 2023-07-29 DIAGNOSIS — M79604 Pain in right leg: Secondary | ICD-10-CM

## 2023-07-29 DIAGNOSIS — I350 Nonrheumatic aortic (valve) stenosis: Secondary | ICD-10-CM

## 2023-07-29 DIAGNOSIS — I25119 Atherosclerotic heart disease of native coronary artery with unspecified angina pectoris: Secondary | ICD-10-CM

## 2023-07-29 DIAGNOSIS — I5032 Chronic diastolic (congestive) heart failure: Secondary | ICD-10-CM

## 2023-07-29 DIAGNOSIS — Z952 Presence of prosthetic heart valve: Secondary | ICD-10-CM

## 2023-07-29 NOTE — Telephone Encounter (Signed)
Patient returned RN's call.

## 2023-07-29 NOTE — Telephone Encounter (Signed)
Left additional message for patient to callback.

## 2023-08-01 ENCOUNTER — Encounter: Payer: Self-pay | Admitting: Physician Assistant

## 2023-08-01 ENCOUNTER — Ambulatory Visit (INDEPENDENT_AMBULATORY_CARE_PROVIDER_SITE_OTHER): Payer: Medicare Other | Admitting: Physician Assistant

## 2023-08-01 VITALS — BP 154/64 | HR 61 | Temp 97.7°F | Resp 18 | Ht 62.0 in | Wt 129.0 lb

## 2023-08-01 DIAGNOSIS — S75002D Unspecified injury of femoral artery, left leg, subsequent encounter: Secondary | ICD-10-CM

## 2023-08-01 DIAGNOSIS — S75001D Unspecified injury of femoral artery, right leg, subsequent encounter: Secondary | ICD-10-CM

## 2023-08-01 NOTE — Progress Notes (Signed)
POST OPERATIVE OFFICE NOTE    CC:  F/u for surgery  HPI:  This is a 82 y.o. female who is s/p right femoral endarterectomy with bovine pericardial patch angioplasty, right femoral thrombectomy on 06/22/23 by Dr. Myra Gianotti.  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  Pt returns today for follow up with her son.  Pt states overall she is very fatigued and weak. She also is concerned about swelling in her right leg since her surgery and pain in her foot. She says she can't really describe the pain but that it has been hurting. She has been trying to elevate and wear a light compression stockings but she feels this has not helped much. She is using a cane to ambulate. Her right groin is still sore from her surgery but her incision is healing well. She does have some bruises on her left hip and leg that she is concerned about as well as a bruise on right ring finger. She does not recall any injury to these areas. She is medically managed on Aspirin and statin.   Allergies  Allergen Reactions   Clarithromycin Other (See Comments)    PATIENT UNSURE OF REACTION   Clindamycin Other (See Comments)   Codeine     REACTION: nausea   Levofloxacin     REACTION: nausea   Morphine Other (See Comments)    Nausea  Nausea    Naproxen     REACTION: nausea   Omeprazole Other (See Comments)    Made her jittery, made her feel weird, ?rash   Penicillins Other (See Comments) and Rash    REACTION: RASH REACTION: RASH    Pneumococcal Vaccines Rash    Small rash on arm at injection site.     Current Outpatient Medications  Medication Sig Dispense Refill   aspirin 81 MG tablet Take 81 mg by mouth daily.       atorvastatin (LIPITOR) 20 MG tablet TAKE 1 TABLET BY MOUTH EVERY DAY 90 tablet 1   Calcium Carbonate (CALTRATE 600 PO) Take 1 tablet by mouth 2 (two) times daily.       citalopram (CELEXA) 20 MG tablet TAKE 1 TABLET BY  MOUTH EVERY DAY 90 tablet 2   co-enzyme Q-10 30 MG capsule Take 30 mg by mouth daily.     doxycycline (VIBRAMYCIN) 100 MG capsule Take 1 capsule (100 mg total) by mouth as needed (Take two tablets by mouth one hour prior to dental appointments). 10 capsule 12   famotidine (PEPCID) 40 MG tablet TAKE 1 TABLET BY MOUTH EVERY DAY 90 tablet 1   fluticasone (FLONASE) 50 MCG/ACT nasal spray Place 1 spray into both nostrils daily as needed for allergies or rhinitis. 9.9 mL 5   furosemide (LASIX) 20 MG tablet TAKE 1 TABLET BY MOUTH EVERY DAY 90 tablet 0   levothyroxine (SYNTHROID) 75 MCG tablet TAKE 1 TABLET BY MOUTH EVERY MORNING BEFORE BREAKFAST 90 tablet 1   losartan (COZAAR) 50 MG tablet TAKE 1 TABLET BY MOUTH EVERY DAY 90 tablet 3   meloxicam (MOBIC) 7.5 MG tablet TAKE 1/2 TO 1 TABLET EVERY DAY 90 tablet 0   metoprolol tartrate (LOPRESSOR) 25 MG tablet Take 1 tablet (25 mg total) by mouth 2 (two) times daily. 180 tablet 1   Multiple Vitamins-Minerals (MULTIVITAMIN) LIQD Take 1 tablet by mouth daily.     Multiple Vitamins-Minerals (PRESERVISION AREDS 2) CAPS Take 1 capsule by mouth 2 (  two) times daily.     senna-docusate (SENOKOT-S) 8.6-50 MG per tablet Take 1 tablet by mouth daily.       VITAMIN D, CHOLECALCIFEROL, PO Take 1 tablet by mouth daily.     zolpidem (AMBIEN) 5 MG tablet Take 1 tablet (5 mg total) by mouth at bedtime as needed. for sleep 30 tablet 5   No current facility-administered medications for this visit.     ROS:  See HPI  Physical Exam:  Vitals:   08/01/23 1401  BP: (!) 154/64  Pulse: 61  Resp: 18  Temp: 97.7 F (36.5 C)  SpO2: 95%   General: elderly female in NAD Cardiac: regular, systolic murmur Lungs: non labored Incision:  Right groin incision is clean, dry and intact. It is healing very well. Mildly tender to palpation. No swelling, no erythema. Small hematoma likely present Extremities: 3+ femoral pulses BLE, well perfused and warm. Doppler DP/PT/ Peroneal  signals bilaterally. 2+ right radial pulse, hand warm and well perfused. Doppler palmar arch signals. Discoloration with bruising of the distal aspect of right 4th finger Neuro: alert and oriented Abdomen:  soft  Assessment/Plan:  This is a 82 y.o. female who is s/p: right femoral endarterectomy with bovine pericardial patch angioplasty, right femoral thrombectomy on 06/22/23 by Dr. Myra Gianotti.  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 Her right groin incision is healing very well. Likely has small little hematoma in right groin. BLE well perfused and warm with Doppler DP/ PT/Pero signals. Her right 4th finger discoloration may be embolic. She hand and arm are well perfused with brisk doppler signals.  - continue Aspirin and statin - encourage continued elevation and compression stocking use as needed for swelling - She should follow up with Cardiologist regarding generalized fatigue post TAVR as well as possible cardiac rehab -I will have her return in 3 months with ABI   Nathanial Rancher, PA-C [facility] [phone]   Clinic MD:  Myra Gianotti

## 2023-08-02 ENCOUNTER — Telehealth: Payer: Self-pay | Admitting: Cardiology

## 2023-08-02 NOTE — Telephone Encounter (Signed)
Discussed LE arterial US results with patient.  Per Georgie Chard, NP: ----- Message from Georgie Chard sent at 08/02/2023  9:46 AM EDT ----- Please let the patient know that her arterial dopplers are normal and it looks like she was seen yesterday with Vascular team with well perfusing extremities and no concern for occlusions.       Patient states she continues to have swelling and pain to her right leg and foot. She expressed frustration with this not improving. Reviewed OV note from visit with vascular team yesterday, patient encouraged to continue elevating leg and wearing compression stockings as needed. Patient states she is doing this but is concerned there has not been any improvement in her swelling/pain.  Patient states she saw her PCP on 9/11 and she ordered a chest CT and RLE soft tissue US to be performed.  Patient states she is note moving around much due to swelling/pain and feeling weak/tired. She states she does do foot and leg exercises while sitting.  Patient asked if she needs to see Dr. [name] for these continued issues.  Will forward to Georgie Chard, NP to review and advise.

## 2023-08-02 NOTE — Telephone Encounter (Signed)
-----   Message from Georgie Chard sent at 08/02/2023  9:46 AM EDT ----- Please let the patient know that her arterial dopplers are normal and it looks like she was seen yesterday with Vascular team with well perfusing extremities and no concern for occlusions.

## 2023-08-02 NOTE — Telephone Encounter (Signed)
Left detailed message for patient asking that if she still needs to ECHO appt for Aug 2025 rescheduled to call back.

## 2023-08-02 NOTE — Telephone Encounter (Signed)
Patient states she is returning a call.

## 2023-08-02 NOTE — Telephone Encounter (Signed)
Patient returned staff call and noted she has already received her LE Arterial test results.

## 2023-08-02 NOTE — Telephone Encounter (Signed)
Left voicemail to return call to office.

## 2023-08-04 ENCOUNTER — Encounter (HOSPITAL_COMMUNITY): Payer: Medicare Other

## 2023-08-05 ENCOUNTER — Ambulatory Visit (HOSPITAL_BASED_OUTPATIENT_CLINIC_OR_DEPARTMENT_OTHER)
Admission: RE | Admit: 2023-08-05 | Discharge: 2023-08-05 | Disposition: A | Discharge status: Home / Self Care | Payer: Medicare Other | Source: Ambulatory Visit | Attending: Internal Medicine

## 2023-08-05 ENCOUNTER — Other Ambulatory Visit: Payer: Self-pay

## 2023-08-05 DIAGNOSIS — Q676 Pectus excavatum: Secondary | ICD-10-CM | POA: Diagnosis not present

## 2023-08-05 DIAGNOSIS — J929 Pleural plaque without asbestos: Secondary | ICD-10-CM | POA: Diagnosis not present

## 2023-08-05 DIAGNOSIS — R918 Other nonspecific abnormal finding of lung field: Secondary | ICD-10-CM | POA: Diagnosis not present

## 2023-08-05 DIAGNOSIS — R911 Solitary pulmonary nodule: Secondary | ICD-10-CM

## 2023-08-05 DIAGNOSIS — S75002D Unspecified injury of femoral artery, left leg, subsequent encounter: Secondary | ICD-10-CM

## 2023-08-10 ENCOUNTER — Ambulatory Visit (HOSPITAL_BASED_OUTPATIENT_CLINIC_OR_DEPARTMENT_OTHER)
Admission: RE | Admit: 2023-08-10 | Discharge: 2023-08-10 | Disposition: A | Discharge status: Home / Self Care | Payer: Medicare Other | Source: Ambulatory Visit | Attending: Internal Medicine | Admitting: Internal Medicine

## 2023-08-10 DIAGNOSIS — R9389 Abnormal findings on diagnostic imaging of other specified body structures: Secondary | ICD-10-CM | POA: Diagnosis not present

## 2023-08-10 DIAGNOSIS — Z0189 Encounter for other specified special examinations: Secondary | ICD-10-CM | POA: Diagnosis not present

## 2023-08-11 ENCOUNTER — Telehealth: Payer: Self-pay | Admitting: Physician Assistant

## 2023-08-11 NOTE — Telephone Encounter (Signed)
Patient called back into the office to follow up because she had been waiting for a return call to address several questions. She did not have follow up with her general cardiologist and next apt in 1 year. I made her an appointment with Dr. [name] to address her ongoing issues on 08/26/23. She was very grateful for the call.   Cline Crock PA-C  [facility]

## 2023-08-18 ENCOUNTER — Other Ambulatory Visit: Payer: Self-pay | Admitting: Internal Medicine

## 2023-08-23 ENCOUNTER — Telehealth: Payer: Self-pay

## 2023-08-23 NOTE — Telephone Encounter (Signed)
Pt called requesting to move her appts earlier d/t lingering swelling and pain.  Reviewed pt's chart, returned call for clarification, no answer, lf vm stating that she should continue elevation and compression. Pt has an appt with cardiology for a post TAVR visit on 10/11. Encouraged her to keep that appt and give this office an update. Asked pt to call back if she still needs triage.

## 2023-08-25 ENCOUNTER — Telehealth: Payer: Self-pay

## 2023-08-25 NOTE — Telephone Encounter (Signed)
Spoke to pt to confirm she received the message left for her earlier this week. She did and has cardiology appt tomorrow. She states the swelling is still about the same (RLE>LLE). She states she has been wearing her compression hose and is elevating her legs. She will call us back tomorrow if needed, after f/u with cardiology.

## 2023-08-26 ENCOUNTER — Encounter: Payer: Self-pay | Admitting: Cardiovascular Disease

## 2023-08-26 ENCOUNTER — Ambulatory Visit: Payer: Medicare Other | Attending: Cardiovascular Disease | Admitting: Cardiovascular Disease

## 2023-08-26 VITALS — BP 120/72 | HR 56 | Ht 62.0 in | Wt 129.4 lb

## 2023-08-26 DIAGNOSIS — I5032 Chronic diastolic (congestive) heart failure: Secondary | ICD-10-CM | POA: Diagnosis not present

## 2023-08-26 DIAGNOSIS — Z952 Presence of prosthetic heart valve: Secondary | ICD-10-CM | POA: Insufficient documentation

## 2023-08-26 DIAGNOSIS — I25119 Atherosclerotic heart disease of native coronary artery with unspecified angina pectoris: Secondary | ICD-10-CM | POA: Diagnosis not present

## 2023-08-26 DIAGNOSIS — N3944 Nocturnal enuresis: Secondary | ICD-10-CM | POA: Diagnosis not present

## 2023-08-26 DIAGNOSIS — R3915 Urgency of urination: Secondary | ICD-10-CM | POA: Diagnosis not present

## 2023-08-26 NOTE — Assessment & Plan Note (Signed)
Patient appears stable on aspirin and atorvastatin.  Also treated with metoprolol and losartan.  Continue current management.  No recent chest pain.

## 2023-08-26 NOTE — Assessment & Plan Note (Signed)
Appears euvolemic on exam.  Right lower extremity edema is related to her ischemic leg and need for vascular surgery.  Advised her that this will likely improve over time.  The patient is making slow progress.  I think she would benefit from phase 2 cardiac rehab.  I wrote her for a handicap placard as she is having more trouble getting around due to general gait instability.  I will see her back in 3 months for follow-up.  I did not make any medicine changes today.  I reviewed her most recent vascular surgery follow-up notes and they we will continue to follow her closely with an appointment scheduled in December.

## 2023-08-26 NOTE — Progress Notes (Signed)
CARDIAC CATHETERIZATION  CARDIAC CATHETERIZATION 06/09/2023  Narrative   Mid RCA lesion is 100% stenosed.   Prox Cx lesion is 99% stenosed.   Ost LAD to Prox LAD lesion is 90% stenosed.   SVG graft was visualized by angiography and is normal in caliber.   SVG graft was visualized by angiography and is normal in caliber.   LIMA graft was visualized by angiography and is normal in caliber.  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 Patent sequential vein graft to the LAD and Diagonal Severe proximal Circumflex stenosis. Patent LIMA graft to the obtuse marginal branch Chronic total occlusion of the mid  RCA. The PDA and posterolateral arteries fill from the sequential vein graft. Elevated right heart pressures RA 7 RV 47/7/9 PA 48/19 mean 35 PCWP 26   Recommendations: Continue workup for TAVR. Medical management of CAD  Findings Coronary Findings Diagnostic  Dominance: Right  Left Anterior Descending Ost LAD to Prox LAD lesion is 90% stenosed.  Left Circumflex Vessel is large. Prox Cx lesion is 99% stenosed.  Right Coronary Artery Vessel is large. Mid RCA lesion is 100% stenosed. The lesion is chronically occluded.  Sequential Saphenous Graft To 2nd RPL, RPDA SVG graft was visualized by angiography and is normal in caliber.  Sequential Saphenous Graft To 1st Diag, Mid LAD SVG graft was visualized by angiography and is normal in caliber.  LIMA LIMA Graft To 2nd Mrg LIMA graft was visualized by angiography and is normal in caliber.  Intervention  No interventions have been documented.   STRESS TESTS  MYOCARDIAL PERFUSION IMAGING 12/17/2021  Narrative   The study is normal. Findings are consistent with no prior ischemia and no prior myocardial infarction. The study is low risk.   No ST deviation was noted.   LV perfusion is normal. There is no evidence of ischemia. There is no evidence of infarction.   Left ventricular function is normal. Nuclear stress EF: 71 %. The left ventricular ejection fraction is hyperdynamic (>65%). End diastolic cavity size is normal. End systolic cavity size is normal.   Prior study available for comparison from 09/02/2017. No changes compared to prior study.   ECHOCARDIOGRAM  ECHOCARDIOGRAM COMPLETE 07/25/2023  Narrative ECHOCARDIOGRAM REPORT    Patient Name:   [name] Date of Exam: 07/25/2023 Medical Rec #:  [id number]     Height:       62.0 in Accession #:    [id number]    Weight:       125.0 lb Date of Birth:  08-22-1941      BSA:          1.566 m Patient Age:    82 years      BP:           132/58 mmHg Patient Gender: F              HR:           57 bpm. Exam Location:  [address]  Procedure: 2D Echo, Color Doppler, Cardiac Doppler and 3D Echo  Indications:    Post TAVR Evaluation Z95.2  History:        Patient has prior history of Echocardiogram examinations, most recent 06/22/2023. CAD, Prior CABG; Risk Factors:Hypertension and Dyslipidemia.  Sonographer:    Thurman Coyer RDCS Referring Phys: [phone] JILL D MCDANIEL  IMPRESSIONS   1. Left ventricular ejection fraction, by estimation, is 65 to 70%. The left ventricle has normal  CARDIAC CATHETERIZATION  CARDIAC CATHETERIZATION 06/09/2023  Narrative   Mid RCA lesion is 100% stenosed.   Prox Cx lesion is 99% stenosed.   Ost LAD to Prox LAD lesion is 90% stenosed.   SVG graft was visualized by angiography and is normal in caliber.   SVG graft was visualized by angiography and is normal in caliber.   LIMA graft was visualized by angiography and is normal in caliber.  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 Patent sequential vein graft to the LAD and Diagonal Severe proximal Circumflex stenosis. Patent LIMA graft to the obtuse marginal branch Chronic total occlusion of the mid  RCA. The PDA and posterolateral arteries fill from the sequential vein graft. Elevated right heart pressures RA 7 RV 47/7/9 PA 48/19 mean 35 PCWP 26   Recommendations: Continue workup for TAVR. Medical management of CAD  Findings Coronary Findings Diagnostic  Dominance: Right  Left Anterior Descending Ost LAD to Prox LAD lesion is 90% stenosed.  Left Circumflex Vessel is large. Prox Cx lesion is 99% stenosed.  Right Coronary Artery Vessel is large. Mid RCA lesion is 100% stenosed. The lesion is chronically occluded.  Sequential Saphenous Graft To 2nd RPL, RPDA SVG graft was visualized by angiography and is normal in caliber.  Sequential Saphenous Graft To 1st Diag, Mid LAD SVG graft was visualized by angiography and is normal in caliber.  LIMA LIMA Graft To 2nd Mrg LIMA graft was visualized by angiography and is normal in caliber.  Intervention  No interventions have been documented.   STRESS TESTS  MYOCARDIAL PERFUSION IMAGING 12/17/2021  Narrative   The study is normal. Findings are consistent with no prior ischemia and no prior myocardial infarction. The study is low risk.   No ST deviation was noted.   LV perfusion is normal. There is no evidence of ischemia. There is no evidence of infarction.   Left ventricular function is normal. Nuclear stress EF: 71 %. The left ventricular ejection fraction is hyperdynamic (>65%). End diastolic cavity size is normal. End systolic cavity size is normal.   Prior study available for comparison from 09/02/2017. No changes compared to prior study.   ECHOCARDIOGRAM  ECHOCARDIOGRAM COMPLETE 07/25/2023  Narrative ECHOCARDIOGRAM REPORT    Patient Name:   [name] Date of Exam: 07/25/2023 Medical Rec #:  [id number]     Height:       62.0 in Accession #:    [id number]    Weight:       125.0 lb Date of Birth:  08-22-1941      BSA:          1.566 m Patient Age:    82 years      BP:           132/58 mmHg Patient Gender: F              HR:           57 bpm. Exam Location:  [address]  Procedure: 2D Echo, Color Doppler, Cardiac Doppler and 3D Echo  Indications:    Post TAVR Evaluation Z95.2  History:        Patient has prior history of Echocardiogram examinations, most recent 06/22/2023. CAD, Prior CABG; Risk Factors:Hypertension and Dyslipidemia.  Sonographer:    Thurman Coyer RDCS Referring Phys: [phone] JILL D MCDANIEL  IMPRESSIONS   1. Left ventricular ejection fraction, by estimation, is 65 to 70%. The left ventricle has normal  CARDIAC CATHETERIZATION  CARDIAC CATHETERIZATION 06/09/2023  Narrative   Mid RCA lesion is 100% stenosed.   Prox Cx lesion is 99% stenosed.   Ost LAD to Prox LAD lesion is 90% stenosed.   SVG graft was visualized by angiography and is normal in caliber.   SVG graft was visualized by angiography and is normal in caliber.   LIMA graft was visualized by angiography and is normal in caliber.  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 Patent sequential vein graft to the LAD and Diagonal Severe proximal Circumflex stenosis. Patent LIMA graft to the obtuse marginal branch Chronic total occlusion of the mid  RCA. The PDA and posterolateral arteries fill from the sequential vein graft. Elevated right heart pressures RA 7 RV 47/7/9 PA 48/19 mean 35 PCWP 26   Recommendations: Continue workup for TAVR. Medical management of CAD  Findings Coronary Findings Diagnostic  Dominance: Right  Left Anterior Descending Ost LAD to Prox LAD lesion is 90% stenosed.  Left Circumflex Vessel is large. Prox Cx lesion is 99% stenosed.  Right Coronary Artery Vessel is large. Mid RCA lesion is 100% stenosed. The lesion is chronically occluded.  Sequential Saphenous Graft To 2nd RPL, RPDA SVG graft was visualized by angiography and is normal in caliber.  Sequential Saphenous Graft To 1st Diag, Mid LAD SVG graft was visualized by angiography and is normal in caliber.  LIMA LIMA Graft To 2nd Mrg LIMA graft was visualized by angiography and is normal in caliber.  Intervention  No interventions have been documented.   STRESS TESTS  MYOCARDIAL PERFUSION IMAGING 12/17/2021  Narrative   The study is normal. Findings are consistent with no prior ischemia and no prior myocardial infarction. The study is low risk.   No ST deviation was noted.   LV perfusion is normal. There is no evidence of ischemia. There is no evidence of infarction.   Left ventricular function is normal. Nuclear stress EF: 71 %. The left ventricular ejection fraction is hyperdynamic (>65%). End diastolic cavity size is normal. End systolic cavity size is normal.   Prior study available for comparison from 09/02/2017. No changes compared to prior study.   ECHOCARDIOGRAM  ECHOCARDIOGRAM COMPLETE 07/25/2023  Narrative ECHOCARDIOGRAM REPORT    Patient Name:   [name] Date of Exam: 07/25/2023 Medical Rec #:  [id number]     Height:       62.0 in Accession #:    [id number]    Weight:       125.0 lb Date of Birth:  08-22-1941      BSA:          1.566 m Patient Age:    82 years      BP:           132/58 mmHg Patient Gender: F              HR:           57 bpm. Exam Location:  [address]  Procedure: 2D Echo, Color Doppler, Cardiac Doppler and 3D Echo  Indications:    Post TAVR Evaluation Z95.2  History:        Patient has prior history of Echocardiogram examinations, most recent 06/22/2023. CAD, Prior CABG; Risk Factors:Hypertension and Dyslipidemia.  Sonographer:    Thurman Coyer RDCS Referring Phys: [phone] JILL D MCDANIEL  IMPRESSIONS   1. Left ventricular ejection fraction, by estimation, is 65 to 70%. The left ventricle has normal  Cardiology Office Note:    Date:  08/26/2023   ID:  Nikaya, Nasby September 21, 1941, MRN [id number]  PCP:  Pincus Sanes, MD   [facility] Providers Cardiologist:  Tonny Bollman, MD     Referring MD: Pincus Sanes, MD   Chief Complaint  Patient presents with   Shortness of Breath    History of Present Illness:    [name] is a 82 y.o. female presenting for follow-up after undergoing TAVR 06/21/2023.  She was treated with a 20 mm SAPIEN 3 valve for treatment of severe paradoxical low-flow low gradient aortic stenosis.  Her TAVR was complicated by an ischemic right leg with need for surgical repair of an occluded right femoral artery.  Her other cardiac history dates back many years that she underwent 5 vessel CABG in 2007.  She also has hypertension, hyperlipidemia, and chronic diastolic heart failure.  The patient is here alone today.  She has transition from living at her daughter's house back into her own home now.  She is getting around but is moving much slower than in the past.  She does not complain of shortness of breath or chest pain.  She has had some pain in her right leg and has developed swelling in the right leg ever since her surgery.  She requests a handicap placard today.  She is interested in starting phase 2 cardiac rehab.   Current Medications: Current Meds  Medication Sig   aspirin 81 MG tablet Take 81 mg by mouth daily.     atorvastatin (LIPITOR) 20 MG tablet TAKE 1 TABLET BY MOUTH EVERY DAY   Calcium Carbonate (CALTRATE 600 PO) Take 1 tablet by mouth 2 (two) times daily.     citalopram (CELEXA) 20 MG tablet TAKE 1 TABLET BY MOUTH EVERY DAY   co-enzyme Q-10 30 MG capsule Take 30 mg by mouth daily.   doxycycline (VIBRAMYCIN) 100 MG capsule Take 1 capsule (100 mg total) by mouth as needed (Take two tablets by mouth one hour prior to dental appointments).   famotidine (PEPCID) 40 MG tablet TAKE 1 TABLET BY MOUTH EVERY DAY   fluticasone (FLONASE) 50  MCG/ACT nasal spray Place 1 spray into both nostrils daily as needed for allergies or rhinitis.   furosemide (LASIX) 20 MG tablet TAKE 1 TABLET BY MOUTH EVERY DAY   levothyroxine (SYNTHROID) 75 MCG tablet TAKE 1 TABLET BY MOUTH EVERY MORNING BEFORE BREAKFAST   losartan (COZAAR) 50 MG tablet TAKE 1 TABLET BY MOUTH EVERY DAY   meloxicam (MOBIC) 7.5 MG tablet TAKE 1/2 TO 1 TABLET EVERY DAY   metoprolol tartrate (LOPRESSOR) 25 MG tablet TAKE 1 TABLET BY MOUTH TWICE A DAY   Multiple Vitamins-Minerals (MULTIVITAMIN) LIQD Take 1 tablet by mouth daily.   Multiple Vitamins-Minerals (PRESERVISION AREDS 2) CAPS Take 1 capsule by mouth 2 (two) times daily.   senna-docusate (SENOKOT-S) 8.6-50 MG per tablet Take 1 tablet by mouth daily.     VITAMIN D, CHOLECALCIFEROL, PO Take 1 tablet by mouth daily.   zolpidem (AMBIEN) 5 MG tablet Take 1 tablet (5 mg total) by mouth at bedtime as needed. for sleep     Allergies:   Clarithromycin, Clindamycin, Codeine, Levofloxacin, Morphine, Naproxen, Omeprazole, Penicillins, and Pneumococcal vaccines   ROS:   Please see the history of present illness.    All other systems reviewed and are negative.  EKGs/Labs/Other Studies Reviewed:    The following studies were reviewed today: Cardiac Studies & Procedures  Cardiology Office Note:    Date:  08/26/2023   ID:  Nikaya, Nasby September 21, 1941, MRN [id number]  PCP:  Pincus Sanes, MD   [facility] Providers Cardiologist:  Tonny Bollman, MD     Referring MD: Pincus Sanes, MD   Chief Complaint  Patient presents with   Shortness of Breath    History of Present Illness:    [name] is a 82 y.o. female presenting for follow-up after undergoing TAVR 06/21/2023.  She was treated with a 20 mm SAPIEN 3 valve for treatment of severe paradoxical low-flow low gradient aortic stenosis.  Her TAVR was complicated by an ischemic right leg with need for surgical repair of an occluded right femoral artery.  Her other cardiac history dates back many years that she underwent 5 vessel CABG in 2007.  She also has hypertension, hyperlipidemia, and chronic diastolic heart failure.  The patient is here alone today.  She has transition from living at her daughter's house back into her own home now.  She is getting around but is moving much slower than in the past.  She does not complain of shortness of breath or chest pain.  She has had some pain in her right leg and has developed swelling in the right leg ever since her surgery.  She requests a handicap placard today.  She is interested in starting phase 2 cardiac rehab.   Current Medications: Current Meds  Medication Sig   aspirin 81 MG tablet Take 81 mg by mouth daily.     atorvastatin (LIPITOR) 20 MG tablet TAKE 1 TABLET BY MOUTH EVERY DAY   Calcium Carbonate (CALTRATE 600 PO) Take 1 tablet by mouth 2 (two) times daily.     citalopram (CELEXA) 20 MG tablet TAKE 1 TABLET BY MOUTH EVERY DAY   co-enzyme Q-10 30 MG capsule Take 30 mg by mouth daily.   doxycycline (VIBRAMYCIN) 100 MG capsule Take 1 capsule (100 mg total) by mouth as needed (Take two tablets by mouth one hour prior to dental appointments).   famotidine (PEPCID) 40 MG tablet TAKE 1 TABLET BY MOUTH EVERY DAY   fluticasone (FLONASE) 50  MCG/ACT nasal spray Place 1 spray into both nostrils daily as needed for allergies or rhinitis.   furosemide (LASIX) 20 MG tablet TAKE 1 TABLET BY MOUTH EVERY DAY   levothyroxine (SYNTHROID) 75 MCG tablet TAKE 1 TABLET BY MOUTH EVERY MORNING BEFORE BREAKFAST   losartan (COZAAR) 50 MG tablet TAKE 1 TABLET BY MOUTH EVERY DAY   meloxicam (MOBIC) 7.5 MG tablet TAKE 1/2 TO 1 TABLET EVERY DAY   metoprolol tartrate (LOPRESSOR) 25 MG tablet TAKE 1 TABLET BY MOUTH TWICE A DAY   Multiple Vitamins-Minerals (MULTIVITAMIN) LIQD Take 1 tablet by mouth daily.   Multiple Vitamins-Minerals (PRESERVISION AREDS 2) CAPS Take 1 capsule by mouth 2 (two) times daily.   senna-docusate (SENOKOT-S) 8.6-50 MG per tablet Take 1 tablet by mouth daily.     VITAMIN D, CHOLECALCIFEROL, PO Take 1 tablet by mouth daily.   zolpidem (AMBIEN) 5 MG tablet Take 1 tablet (5 mg total) by mouth at bedtime as needed. for sleep     Allergies:   Clarithromycin, Clindamycin, Codeine, Levofloxacin, Morphine, Naproxen, Omeprazole, Penicillins, and Pneumococcal vaccines   ROS:   Please see the history of present illness.    All other systems reviewed and are negative.  EKGs/Labs/Other Studies Reviewed:    The following studies were reviewed today: Cardiac Studies & Procedures  CARDIAC CATHETERIZATION  CARDIAC CATHETERIZATION 06/09/2023  Narrative   Mid RCA lesion is 100% stenosed.   Prox Cx lesion is 99% stenosed.   Ost LAD to Prox LAD lesion is 90% stenosed.   SVG graft was visualized by angiography and is normal in caliber.   SVG graft was visualized by angiography and is normal in caliber.   LIMA graft was visualized by angiography and is normal in caliber.  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 Patent sequential vein graft to the LAD and Diagonal Severe proximal Circumflex stenosis. Patent LIMA graft to the obtuse marginal branch Chronic total occlusion of the mid  RCA. The PDA and posterolateral arteries fill from the sequential vein graft. Elevated right heart pressures RA 7 RV 47/7/9 PA 48/19 mean 35 PCWP 26   Recommendations: Continue workup for TAVR. Medical management of CAD  Findings Coronary Findings Diagnostic  Dominance: Right  Left Anterior Descending Ost LAD to Prox LAD lesion is 90% stenosed.  Left Circumflex Vessel is large. Prox Cx lesion is 99% stenosed.  Right Coronary Artery Vessel is large. Mid RCA lesion is 100% stenosed. The lesion is chronically occluded.  Sequential Saphenous Graft To 2nd RPL, RPDA SVG graft was visualized by angiography and is normal in caliber.  Sequential Saphenous Graft To 1st Diag, Mid LAD SVG graft was visualized by angiography and is normal in caliber.  LIMA LIMA Graft To 2nd Mrg LIMA graft was visualized by angiography and is normal in caliber.  Intervention  No interventions have been documented.   STRESS TESTS  MYOCARDIAL PERFUSION IMAGING 12/17/2021  Narrative   The study is normal. Findings are consistent with no prior ischemia and no prior myocardial infarction. The study is low risk.   No ST deviation was noted.   LV perfusion is normal. There is no evidence of ischemia. There is no evidence of infarction.   Left ventricular function is normal. Nuclear stress EF: 71 %. The left ventricular ejection fraction is hyperdynamic (>65%). End diastolic cavity size is normal. End systolic cavity size is normal.   Prior study available for comparison from 09/02/2017. No changes compared to prior study.   ECHOCARDIOGRAM  ECHOCARDIOGRAM COMPLETE 07/25/2023  Narrative ECHOCARDIOGRAM REPORT    Patient Name:   [name] Date of Exam: 07/25/2023 Medical Rec #:  [id number]     Height:       62.0 in Accession #:    [id number]    Weight:       125.0 lb Date of Birth:  08-22-1941      BSA:          1.566 m Patient Age:    82 years      BP:           132/58 mmHg Patient Gender: F              HR:           57 bpm. Exam Location:  [address]  Procedure: 2D Echo, Color Doppler, Cardiac Doppler and 3D Echo  Indications:    Post TAVR Evaluation Z95.2  History:        Patient has prior history of Echocardiogram examinations, most recent 06/22/2023. CAD, Prior CABG; Risk Factors:Hypertension and Dyslipidemia.  Sonographer:    Thurman Coyer RDCS Referring Phys: [phone] JILL D MCDANIEL  IMPRESSIONS   1. Left ventricular ejection fraction, by estimation, is 65 to 70%. The left ventricle has normal

## 2023-08-26 NOTE — Patient Instructions (Signed)
Medication Instructions:  Your physician recommends that you continue on your current medications as directed. Please refer to the Current Medication list given to you today.  *If you need a refill on your cardiac medications before your next appointment, please call your pharmacy*  Testing/Procedures: Ambulatory referral to Cardiac Rehabilitation  Follow-Up: At [facility], you and your health needs are our priority.  As part of our continuing mission to provide you with exceptional heart care, we have created designated Provider Care Teams.  These Care Teams include your primary Cardiologist (physician) and Advanced Practice Providers (APPs -  Physician Assistants and Nurse Practitioners) who all work together to provide you with the care you need, when you need it.  Your next appointment:   3 month(s)  Provider:   Tonny Bollman, MD

## 2023-08-26 NOTE — Assessment & Plan Note (Signed)
30-day post TAVR echo reviewed with normal gradients and mild PVL.  Mean transvalvular gradient is 9 mmHg with a 20 mm SAPIEN 3 valve indicating no evidence of patient prosthesis mismatch.  Continue aspirin for antiplatelet therapy.

## 2023-08-27 ENCOUNTER — Other Ambulatory Visit: Payer: Self-pay | Admitting: Internal Medicine

## 2023-08-29 ENCOUNTER — Encounter (HOSPITAL_COMMUNITY): Payer: Self-pay

## 2023-08-29 ENCOUNTER — Telehealth (HOSPITAL_COMMUNITY): Payer: Self-pay

## 2023-08-29 NOTE — Telephone Encounter (Signed)
Attempted to call pt in regards to Cardiac rehab. LM on VM   Mailed letter

## 2023-08-29 NOTE — Telephone Encounter (Signed)
Office referral recv'ed, printed and given to RN for review.

## 2023-08-29 NOTE — Telephone Encounter (Signed)
Pt returned CR phone call and stated she is interested in CR. Explained scheduling process, patient verbalized understanding.

## 2023-08-30 ENCOUNTER — Other Ambulatory Visit: Payer: Self-pay | Admitting: Internal Medicine

## 2023-08-30 MED ORDER — ZOLPIDEM TARTRATE 5 MG PO TABS: ORAL_TABLET | ORAL | 0 refills | Status: DC

## 2023-08-30 NOTE — Telephone Encounter (Signed)
Patient called again and needs this medication - please advise patient

## 2023-09-05 ENCOUNTER — Encounter (HOSPITAL_COMMUNITY): Payer: Self-pay

## 2023-09-05 ENCOUNTER — Telehealth (HOSPITAL_COMMUNITY): Payer: Self-pay

## 2023-09-05 DIAGNOSIS — R350 Frequency of micturition: Secondary | ICD-10-CM | POA: Diagnosis not present

## 2023-09-05 DIAGNOSIS — R3915 Urgency of urination: Secondary | ICD-10-CM | POA: Diagnosis not present

## 2023-09-05 NOTE — Telephone Encounter (Signed)
Pt insurance is active and benefits verified through Medicare A&B Co-pay 0, DED 240/240 met, out of pocket 0/0 met, co-insurance 20. no pre-authorization required. Passport, 09/05/2023@135 , REF# [id number]   Pt insurance is active and benefits verified through [employer] Co-pay 0, DED 0/0 met, out of pocket 0/0 met, co-insurance 0. no pre-authorization required.    How many CR sessions are covered? (36 visits for TCR, 72 visits for ICR)72 Is this a lifetime maximum or an annual maximum? lifetime Has the member used any of these services to date? no Is there a time limit (weeks/months) on start of program and/or program completion? no

## 2023-09-05 NOTE — Telephone Encounter (Signed)
Attempted to call pt in regards to Cardiac rehab. LM on VM

## 2023-09-06 ENCOUNTER — Telehealth (HOSPITAL_COMMUNITY): Payer: Self-pay

## 2023-09-06 NOTE — Telephone Encounter (Signed)
Called patient to see if she was interested in participating in the cardiac Rehab Program. Patient stated yes. Patient will come in for orientation on 10/24@115  and will attend the 145 exercise class.

## 2023-09-08 ENCOUNTER — Encounter (HOSPITAL_COMMUNITY)
Admission: RE | Admit: 2023-09-08 | Discharge: 2023-09-08 | Disposition: A | Discharge status: Home / Self Care | Payer: Medicare Other | Source: Ambulatory Visit | Attending: Cardiovascular Disease | Admitting: Cardiovascular Disease

## 2023-09-08 ENCOUNTER — Other Ambulatory Visit: Payer: Self-pay

## 2023-09-08 ENCOUNTER — Other Ambulatory Visit (HOSPITAL_COMMUNITY): Payer: Self-pay | Admitting: *Deleted

## 2023-09-08 VITALS — BP 114/62 | HR 56 | Ht 64.0 in | Wt 128.1 lb

## 2023-09-08 DIAGNOSIS — Z952 Presence of prosthetic heart valve: Secondary | ICD-10-CM | POA: Insufficient documentation

## 2023-09-08 DIAGNOSIS — R079 Chest pain, unspecified: Secondary | ICD-10-CM

## 2023-09-08 NOTE — Progress Notes (Signed)
Cardiac Rehab Medication Review   Does the patient  feel that his/her medications are working for him/her?  yes  Has the patient been experiencing any side effects to the medications prescribed?  NO  Does the patient measure his/her own blood pressure or blood glucose at home?  no   Does the patient have any problems obtaining medications due to transportation or finances?   no  Understanding of regimen: excellent Understanding of indications: excellent Potential of compliance: excellent    Comments: Pt feels good with her med. Routine. She has a BP cuff at home but does not check it often due to the Velcro being difficult with her hands.     Harrie Jeans 09/08/2023 4:29 PM

## 2023-09-08 NOTE — Progress Notes (Addendum)
Cardiac Individual Treatment Plan  Patient Details  Name: [name] MRN: [id number] Date of Birth: [birth year] Referring Provider:   Flowsheet Row INTENSIVE CARDIAC REHAB ORIENT from 09/08/2023 in [facility]  Referring Provider Tonny Bollman, MD       Initial Encounter Date:  Flowsheet Row INTENSIVE CARDIAC REHAB ORIENT from 09/08/2023 in [facility]  Date 09/08/23       Visit Diagnosis: 06/21/23 S/P TAVR (transcatheter aortic valve replacement)  Patient's Home Medications on Admission:  Current Outpatient Medications:    aspirin 81 MG tablet, Take 81 mg by mouth daily.  , Disp: , Rfl:    atorvastatin (LIPITOR) 20 MG tablet, TAKE 1 TABLET BY MOUTH EVERY DAY, Disp: 90 tablet, Rfl: 1   Calcium Carbonate (CALTRATE 600 PO), Take 1 tablet by mouth 2 (two) times daily.  , Disp: , Rfl:    citalopram (CELEXA) 20 MG tablet, TAKE 1 TABLET BY MOUTH EVERY DAY, Disp: 90 tablet, Rfl: 2   co-enzyme Q-10 30 MG capsule, Take 30 mg by mouth daily., Disp: , Rfl:    doxycycline (VIBRAMYCIN) 100 MG capsule, Take 1 capsule (100 mg total) by mouth as needed (Take two tablets by mouth one hour prior to dental appointments)., Disp: 10 capsule, Rfl: 12   famotidine (PEPCID) 40 MG tablet, TAKE 1 TABLET BY MOUTH EVERY DAY, Disp: 90 tablet, Rfl: 1   fluticasone (FLONASE) 50 MCG/ACT nasal spray, Place 1 spray into both nostrils daily as needed for allergies or rhinitis., Disp: 9.9 mL, Rfl: 5   furosemide (LASIX) 20 MG tablet, TAKE 1 TABLET BY MOUTH EVERY DAY, Disp: 90 tablet, Rfl: 0   levothyroxine (SYNTHROID) 75 MCG tablet, TAKE 1 TABLET BY MOUTH EVERY MORNING BEFORE BREAKFAST, Disp: 90 tablet, Rfl: 1   losartan (COZAAR) 50 MG tablet, TAKE 1 TABLET BY MOUTH EVERY DAY, Disp: 90 tablet, Rfl: 3   meloxicam (MOBIC) 7.5 MG tablet, TAKE 1/2 TO 1 TABLET EVERY DAY, Disp: 90 tablet, Rfl: 0    metoprolol tartrate (LOPRESSOR) 25 MG tablet, TAKE 1 TABLET BY MOUTH TWICE A DAY, Disp: 180 tablet, Rfl: 1   Multiple Vitamins-Minerals (MULTIVITAMIN) LIQD, Take 1 tablet by mouth daily., Disp: , Rfl:    Multiple Vitamins-Minerals (PRESERVISION AREDS 2) CAPS, Take 1 capsule by mouth 2 (two) times daily., Disp: , Rfl:    senna-docusate (SENOKOT-S) 8.6-50 MG per tablet, Take 1 tablet by mouth daily.  , Disp: , Rfl:    VITAMIN D, CHOLECALCIFEROL, PO, Take 1 tablet by mouth daily., Disp: , Rfl:    zolpidem (AMBIEN) 5 MG tablet, TAKE 1 TABLET (5 MG TOTAL) BY MOUTH EVERY DAY AT BEDTIME AS NEEDED FOR SLEEP, Disp: 30 tablet, Rfl: 0  Past Medical History: Past Medical History:  Diagnosis Date   Allergy    SEASONAL   Anxiety    BREAST CANCER 1991   left, 1991; B mastectomy   CAD (coronary artery disease)    s/p CABG x5 in 2007   Cataract    BILATERAL-REMOVED   Depression    DIVERTICULOSIS    GERD    History of DVT (deep vein thrombosis)    HYPERLIPIDEMIA    Hypertension    HYPOTHYROIDISM    Occlusion and stenosis of carotid artery without mention of cerebral infarction    OSTEOPOROSIS    S/P TAVR (transcatheter aortic valve replacement) 06/21/2023   s/p TAVR with a  23mm Edwards S3UR via the TF approach by Dr. Clifton James & Dr. Leafy Ro   Severe aortic stenosis     Tobacco Use: Social History   Tobacco Use  Smoking Status Never  Smokeless Tobacco Never    Labs: Review Flowsheet  More data exists      Latest Ref Rng & Units 08/04/2022 02/02/2023 06/09/2023 06/21/2023 06/22/2023  Labs for ITP Cardiac and Pulmonary Rehab  Cholestrol 0 - 200 mg/dL 259  563  - - -  LDL (calc) 0 - 99 mg/dL 61  61  - - -  HDL-C >87.56 mg/dL 43.32  95.18  - - -  Trlycerides 0.0 - 149.0 mg/dL 84.1  66.0  - - -  Hemoglobin A1c 4.6 - 6.5 % 5.0  5.9  - - -  PH, Arterial 7.35 - 7.45 - - 7.414  - 7.360   PCO2 arterial 32 - 48 mmHg - - 33.5  - 47.6   Bicarbonate 20.0 - 28.0 mmol/L - - 21.5  17.3  - 26.9   TCO2 22 -  32 mmol/L - - 22  19  28  28    Acid-base deficit 0.0 - 2.0 mmol/L - - 2.0  10.0  - -  O2 Saturation % - - 96  60  - 100     Details       Multiple values from one day are sorted in reverse-chronological order         Capillary Blood Glucose: Lab Results  Component Value Date   GLUCAP 93 06/26/2023   GLUCAP 89 09/27/2022     Exercise Target Goals: Exercise Program Goal: Individual exercise prescription set using results from initial 6 min walk test and THRR while considering  patient's activity barriers and safety.   Exercise Prescription Goal: Initial exercise prescription builds to 30-45 minutes a day of aerobic activity, 2-3 days per week.  Home exercise guidelines will be given to patient during program as part of exercise prescription that the participant will acknowledge.  Activity Barriers & Risk Stratification:  Activity Barriers & Cardiac Risk Stratification - 09/08/23 1628       Activity Barriers & Cardiac Risk Stratification   Activity Barriers Joint Problems;Deconditioning;Chest Pain/Angina;Balance Concerns;Decreased Ventricular Function;Muscular Weakness;Assistive Device    Cardiac Risk Stratification High   <5 METs on            6 Minute Walk:  6 Minute Walk     Row Name 09/08/23 1633         6 Minute Walk   Phase Initial     Distance 720 feet     Walk Time 6 minutes     # of Rest Breaks 0     MPH 1.36     METS 1.22     RPE 9     Perceived Dyspnea  2     VO2 Peak 4.27     Symptoms Yes (comment)     Comments acid reflux during walk, turned into CP and SOB- RN Carlette consulted. See notes for details.     Resting HR 56 bpm     Resting BP 114/62     Resting Oxygen Saturation  95 %     Exercise Oxygen Saturation  during 6 min walk 100 %     Max Ex. HR 71 bpm     Max Ex. BP 158/62     2 Minute Post BP 110/62  Oxygen Initial Assessment:   Oxygen Re-Evaluation:   Oxygen Discharge (Final Oxygen  Re-Evaluation):   Initial Exercise Prescription:  Initial Exercise Prescription - 09/08/23 1600       Date of Initial Exercise RX and Referring Provider   Date 09/08/23    Referring Provider Tonny Bollman, MD    Expected Discharge Date 11/29/22      NuStep   Level 1    SPM 55    Minutes 20    METs 1.8      Prescription Details   Frequency (times per week) 3    Duration Progress to 30 minutes of continuous aerobic without signs/symptoms of physical distress      Intensity   THRR 40-80% of Max Heartrate 54-109    Ratings of Perceived Exertion 11-13    Perceived Dyspnea 0-4      Progression   Progression Continue progressive overload as per policy without signs/symptoms or physical distress.      Resistance Training   Training Prescription Yes    Weight 1    Reps 10-15             Perform Capillary Blood Glucose checks as needed.  Exercise Prescription Changes:   Exercise Comments:   Exercise Goals and Review:   Exercise Goals     Row Name 09/08/23 1626             Exercise Goals   Increase Physical Activity Yes       Intervention Provide advice, education, support and counseling about physical activity/exercise needs.;Develop an individualized exercise prescription for aerobic and resistive training based on initial evaluation findings, risk stratification, comorbidities and participant's personal goals.       Expected Outcomes Short Term: Attend rehab on a regular basis to increase amount of physical activity.;Long Term: Exercising regularly at least 3-5 days a week.;Long Term: Add in home exercise to make exercise part of routine and to increase amount of physical activity.       Increase Strength and Stamina Yes       Intervention Provide advice, education, support and counseling about physical activity/exercise needs.;Develop an individualized exercise prescription for aerobic and resistive training based on initial evaluation findings, risk  stratification, comorbidities and participant's personal goals.       Expected Outcomes Short Term: Increase workloads from initial exercise prescription for resistance, speed, and METs.;Short Term: Perform resistance training exercises routinely during rehab and add in resistance training at home;Long Term: Improve cardiorespiratory fitness, muscular endurance and strength as measured by increased METs and functional capacity ( )       Able to understand and use rate of perceived exertion (RPE) scale Yes       Intervention Provide education and explanation on how to use RPE scale       Expected Outcomes Short Term: Able to use RPE daily in rehab to express subjective intensity level;Long Term:  Able to use RPE to guide intensity level when exercising independently       Knowledge and understanding of Target Heart Rate Range (THRR) Yes       Intervention Provide education and explanation of THRR including how the numbers were predicted and where they are located for reference       Expected Outcomes Short Term: Able to state/look up THRR;Short Term: Able to use daily as guideline for intensity in rehab;Long Term: Able to use THRR to govern intensity when exercising independently       Understanding of Exercise Prescription Yes  Intervention Provide education, explanation, and written materials on patient's individual exercise prescription       Expected Outcomes Short Term: Able to explain program exercise prescription;Long Term: Able to explain home exercise prescription to exercise independently                Exercise Goals Re-Evaluation :   Discharge Exercise Prescription (Final Exercise Prescription Changes):   Nutrition:  Target Goals: Understanding of nutrition guidelines, daily intake of sodium 1500mg , cholesterol 200mg , calories 30% from fat and 7% or less from saturated fats, daily to have 5 or more servings of fruits and vegetables.  Biometrics:  Pre Biometrics -  09/08/23 1636       Pre Biometrics   Height 5\' 4"  (1.626 m)    Weight 58.1 kg    Waist Circumference 32.5 inches    Hip Circumference 38 inches    Waist to Hip Ratio 0.86 %    BMI (Calculated) 21.98    Triceps Skinfold 20 mm    % Body Fat 34.6 %    Grip Strength 18 kg    Flexibility 0 in   not done   Single Leg Stand 0 seconds   not done             Nutrition Therapy Plan and Nutrition Goals:   Nutrition Assessments:  MEDIFICTS Score Key: >=70 Need to make dietary changes  40-70 Heart Healthy Diet <= 40 Therapeutic Level Cholesterol Diet    Picture Your Plate Scores: <69 Unhealthy dietary pattern with much room for improvement. 41-50 Dietary pattern unlikely to meet recommendations for good health and room for improvement. 51-60 More healthful dietary pattern, with some room for improvement.  >60 Healthy dietary pattern, although there may be some specific behaviors that could be improved.    Nutrition Goals Re-Evaluation:   Nutrition Goals Re-Evaluation:   Nutrition Goals Discharge (Final Nutrition Goals Re-Evaluation):   Psychosocial: Target Goals: Acknowledge presence or absence of significant depression and/or stress, maximize coping skills, provide positive support system. Participant is able to verbalize types and ability to use techniques and skills needed for reducing stress and depression.  Initial Review & Psychosocial Screening:  Initial Psych Review & Screening - 09/08/23 1638       Initial Review   Current issues with None Identified      Family Dynamics   Good Support System? Yes   Patient is feeling good and is well supported by her friends, church family and family     Barriers   Psychosocial barriers to participate in program There are no identifiable barriers or psychosocial needs.      Screening Interventions   Interventions Encouraged to exercise;Provide feedback about the scores to participant    Expected Outcomes Long Term  Goal: Stressors or current issues are controlled or eliminated.;Short Term goal: Identification and review with participant of any Quality of Life or Depression concerns found by scoring the questionnaire.;Long Term goal: The participant improves quality of Life and PHQ9 Scores as seen by post scores and/or verbalization of changes             Quality of Life Scores:  Quality of Life - 09/08/23 1637       Quality of Life   Select Quality of Life      Quality of Life Scores   Health/Function Pre 22.15 %    Socioeconomic Pre 25 %    Psych/Spiritual Pre 30 %    Family Pre 30 %  GLOBAL Pre 25.98 %            Scores of 19 and below usually indicate a poorer quality of life in these areas.  A difference of  2-3 points is a clinically meaningful difference.  A difference of 2-3 points in the total score of the Quality of Life Index has been associated with significant improvement in overall quality of life, self-image, physical symptoms, and general health in studies assessing change in quality of life.  PHQ-9: Review Flowsheet  More data exists      09/08/2023 02/02/2023 08/04/2022 08/31/2021 08/27/2020  Depression screen PHQ 2/9  Decreased Interest 0 0 0 0 0  Down, Depressed, Hopeless 0 0 0 0 0  PHQ - 2 Score 0 0 0 0 0  Altered sleeping 0 - - - -  Tired, decreased energy 2 - - - -  Change in appetite 0 - - - -  Feeling bad or failure about yourself  0 - - - -  Trouble concentrating 0 - - - -  Moving slowly or fidgety/restless 0 - - - -  Suicidal thoughts 0 - - - -  PHQ-9 Score 2 - - - -  Difficult doing work/chores Not difficult at all - - - -    Details           Interpretation of Total Score  Total Score Depression Severity:  1-4 = Minimal depression, 5-9 = Mild depression, 10-14 = Moderate depression, 15-19 = Moderately severe depression, 20-27 = Severe depression   Psychosocial Evaluation and Intervention:   Psychosocial Re-Evaluation:   Psychosocial  Discharge (Final Psychosocial Re-Evaluation):   Vocational Rehabilitation: Provide vocational rehab assistance to qualifying candidates.   Vocational Rehab Evaluation & Intervention:  Vocational Rehab - 09/08/23 1635       Initial Vocational Rehab Evaluation & Intervention   Assessment shows need for Vocational Rehabilitation No   patient is retired. No needs            Education: Education Goals: Education classes will be provided on a weekly basis, covering required topics. Participant will state understanding/return demonstration of topics presented.     Core Videos: Exercise    Move It!  Clinical staff conducted group or individual video education with verbal and written material and guidebook.  Patient learns the recommended Pritikin exercise program. Exercise with the goal of living a long, healthy life. Some of the health benefits of exercise include controlled diabetes, healthier blood pressure levels, improved cholesterol levels, improved heart and lung capacity, improved sleep, and better body composition. Everyone should speak with their doctor before starting or changing an exercise routine.  Biomechanical Limitations Clinical staff conducted group or individual video education with verbal and written material and guidebook.  Patient learns how biomechanical limitations can impact exercise and how we can mitigate and possibly overcome limitations to have an impactful and balanced exercise routine.  Body Composition Clinical staff conducted group or individual video education with verbal and written material and guidebook.  Patient learns that body composition (ratio of muscle mass to fat mass) is a key component to assessing overall fitness, rather than body weight alone. Increased fat mass, especially visceral belly fat, can put [place] at increased risk for metabolic syndrome, type 2 diabetes, heart disease, and even death. It is recommended to combine diet and exercise  (cardiovascular and resistance training) to improve your body composition. Seek guidance from your physician and exercise physiologist before implementing an exercise routine.  Exercise Action  Plan Clinical staff conducted group or individual video education with verbal and written material and guidebook.  Patient learns the recommended strategies to achieve and enjoy long-term exercise adherence, including variety, self-motivation, self-efficacy, and positive decision making. Benefits of exercise include fitness, good health, weight management, more energy, better sleep, less stress, and overall well-being.  Medical   Heart Disease Risk Reduction Clinical staff conducted group or individual video education with verbal and written material and guidebook.  Patient learns our heart is our most vital organ as it circulates oxygen, nutrients, white blood cells, and hormones throughout the entire body, and carries waste away. Data supports a plant-based eating plan like the Pritikin Program for its effectiveness in slowing progression of and reversing heart disease. The video provides a number of recommendations to address heart disease.   Metabolic Syndrome and Belly Fat  Clinical staff conducted group or individual video education with verbal and written material and guidebook.  Patient learns what metabolic syndrome is, how it leads to heart disease, and how one can reverse it and keep it from coming back. You have metabolic syndrome if you have 3 of the following 5 criteria: abdominal obesity, high blood pressure, high triglycerides, low HDL cholesterol, and high blood sugar.  Hypertension and Heart Disease Clinical staff conducted group or individual video education with verbal and written material and guidebook.  Patient learns that high blood pressure, or hypertension, is very common in the [place]. Hypertension is largely due to excessive salt intake, but other important risk factors  include being overweight, physical inactivity, drinking too much alcohol, smoking, and not eating enough potassium from fruits and vegetables. High blood pressure is a leading risk factor for heart attack, stroke, congestive heart failure, dementia, kidney failure, and premature death. Long-term effects of excessive salt intake include stiffening of the arteries and thickening of heart muscle and organ damage. Recommendations include ways to reduce hypertension and the risk of heart disease.  Diseases of Our Time - Focusing on Diabetes Clinical staff conducted group or individual video education with verbal and written material and guidebook.  Patient learns why the best way to stop diseases of our time is prevention, through food and other lifestyle changes. Medicine (such as prescription pills and surgeries) is often only a Band-Aid on the problem, not a long-term solution. Most common diseases of our time include obesity, type 2 diabetes, hypertension, heart disease, and cancer. The Pritikin Program is recommended and has been proven to help reduce, reverse, and/or prevent the damaging effects of metabolic syndrome.  Nutrition   Overview of the Pritikin Eating Plan  Clinical staff conducted group or individual video education with verbal and written material and guidebook.  Patient learns about the Pritikin Eating Plan for disease risk reduction. The Pritikin Eating Plan emphasizes a wide variety of unrefined, minimally-processed carbohydrates, like fruits, vegetables, whole grains, and legumes. Go, Caution, and Stop food choices are explained. Plant-based and lean animal proteins are emphasized. Rationale provided for low sodium intake for blood pressure control, low added sugars for blood sugar stabilization, and low added fats and oils for coronary artery disease risk reduction and weight management.  Calorie Density  Clinical staff conducted group or individual video education with verbal and  written material and guidebook.  Patient learns about calorie density and how it impacts the Pritikin Eating Plan. Knowing the characteristics of the food you choose will help you decide whether those foods will lead to weight gain or weight loss, and whether you want  to consume more or less of them. Weight loss is usually a side effect of the Pritikin Eating Plan because of its focus on low calorie-dense foods.  Label Reading  Clinical staff conducted group or individual video education with verbal and written material and guidebook.  Patient learns about the Pritikin recommended label reading guidelines and corresponding recommendations regarding calorie density, added sugars, sodium content, and whole grains.  Dining Out - Part 1  Clinical staff conducted group or individual video education with verbal and written material and guidebook.  Patient learns that restaurant meals can be sabotaging because they can be so high in calories, fat, sodium, and/or sugar. Patient learns recommended strategies on how to positively address this and avoid unhealthy pitfalls.  Facts on Fats  Clinical staff conducted group or individual video education with verbal and written material and guidebook.  Patient learns that lifestyle modifications can be just as effective, if not more so, as many medications for lowering your risk of heart disease. A Pritikin lifestyle can help to reduce your risk of inflammation and atherosclerosis (cholesterol build-up, or plaque, in the artery walls). Lifestyle interventions such as dietary choices and physical activity address the cause of atherosclerosis. A review of the types of fats and their impact on blood cholesterol levels, along with dietary recommendations to reduce fat intake is also included.  Nutrition Action Plan  Clinical staff conducted group or individual video education with verbal and written material and guidebook.  Patient learns how to incorporate Pritikin  recommendations into their lifestyle. Recommendations include planning and keeping personal health goals in mind as an important part of their success.  Healthy Mind-Set    Healthy Minds, Bodies, Hearts  Clinical staff conducted group or individual video education with verbal and written material and guidebook.  Patient learns how to identify when they are stressed. Video will discuss the impact of that stress, as well as the many benefits of stress management. Patient will also be introduced to stress management techniques. The way we think, act, and feel has an impact on our hearts.  How Our Thoughts Can Heal Our Hearts  Clinical staff conducted group or individual video education with verbal and written material and guidebook.  Patient learns that negative thoughts can cause depression and anxiety. This can result in negative lifestyle behavior and serious health problems. Cognitive behavioral therapy is an effective method to help control our thoughts in order to change and improve our emotional outlook.  Additional Videos:  Exercise    Improving Performance  Clinical staff conducted group or individual video education with verbal and written material and guidebook.  Patient learns to use a non-linear approach by alternating intensity levels and lengths of time spent exercising to help burn more calories and lose more body fat. Cardiovascular exercise helps improve heart health, metabolism, hormonal balance, blood sugar control, and recovery from fatigue. Resistance training improves strength, endurance, balance, coordination, reaction time, metabolism, and muscle mass. Flexibility exercise improves circulation, posture, and balance. Seek guidance from your physician and exercise physiologist before implementing an exercise routine and learn your capabilities and proper form for all exercise.  Introduction to Yoga  Clinical staff conducted group or individual video education with verbal and  written material and guidebook.  Patient learns about yoga, a discipline of the coming together of mind, breath, and body. The benefits of yoga include improved flexibility, improved range of motion, better posture and core strength, increased lung function, weight loss, and positive self-image. Yoga's heart health benefits  include lowered blood pressure, healthier heart rate, decreased cholesterol and triglyceride levels, improved immune function, and reduced stress. Seek guidance from your physician and exercise physiologist before implementing an exercise routine and learn your capabilities and proper form for all exercise.  Medical   Aging: Enhancing Your Quality of Life  Clinical staff conducted group or individual video education with verbal and written material and guidebook.  Patient learns key strategies and recommendations to stay in good physical health and enhance quality of life, such as prevention strategies, having an advocate, securing a Health Care Proxy and Power of Attorney, and keeping a list of medications and system for tracking them. It also discusses how to avoid risk for bone loss.  Biology of Weight Control  Clinical staff conducted group or individual video education with verbal and written material and guidebook.  Patient learns that weight gain occurs because we consume more calories than we burn (eating more, moving less). Even if your body weight is normal, you may have higher ratios of fat compared to muscle mass. Too much body fat puts you at increased risk for cardiovascular disease, heart attack, stroke, type 2 diabetes, and obesity-related cancers. In addition to exercise, following the Pritikin Eating Plan can help reduce your risk.  Decoding Lab Results  Clinical staff conducted group or individual video education with verbal and written material and guidebook.  Patient learns that lab test reflects one measurement whose values change over time and are influenced  by many factors, including medication, stress, sleep, exercise, food, hydration, pre-existing medical conditions, and more. It is recommended to use the knowledge from this video to become more involved with your lab results and evaluate your numbers to speak with your doctor.   Diseases of Our Time - Overview  Clinical staff conducted group or individual video education with verbal and written material and guidebook.  Patient learns that according to the CDC, 50% to 70% of chronic diseases (such as obesity, type 2 diabetes, elevated lipids, hypertension, and heart disease) are avoidable through lifestyle improvements including healthier food choices, listening to satiety cues, and increased physical activity.  Sleep Disorders Clinical staff conducted group or individual video education with verbal and written material and guidebook.  Patient learns how good quality and duration of sleep are important to overall health and well-being. Patient also learns about sleep disorders and how they impact health along with recommendations to address them, including discussing with a physician.  Nutrition  Dining Out - Part 2 Clinical staff conducted group or individual video education with verbal and written material and guidebook.  Patient learns how to plan ahead and communicate in order to maximize their dining experience in a healthy and nutritious manner. Included are recommended food choices based on the type of restaurant the patient is visiting.   Fueling a Banker conducted group or individual video education with verbal and written material and guidebook.  There is a strong connection between our food choices and our health. Diseases like obesity and type 2 diabetes are very prevalent and are in large-part due to lifestyle choices. The Pritikin Eating Plan provides plenty of food and hunger-curbing satisfaction. It is easy to follow, affordable, and helps reduce health  risks.  Menu Workshop  Clinical staff conducted group or individual video education with verbal and written material and guidebook.  Patient learns that restaurant meals can sabotage health goals because they are often packed with calories, fat, sodium, and sugar. Recommendations include strategies to plan  ahead and to communicate with the manager, chef, or server to help order a healthier meal.  Planning Your Eating Strategy  Clinical staff conducted group or individual video education with verbal and written material and guidebook.  Patient learns about the Pritikin Eating Plan and its benefit of reducing the risk of disease. The Pritikin Eating Plan does not focus on calories. Instead, it emphasizes high-quality, nutrient-rich foods. By knowing the characteristics of the foods, we choose, we can determine their calorie density and make informed decisions.  Targeting Your Nutrition Priorities  Clinical staff conducted group or individual video education with verbal and written material and guidebook.  Patient learns that lifestyle habits have a tremendous impact on disease risk and progression. This video provides eating and physical activity recommendations based on your personal health goals, such as reducing LDL cholesterol, losing weight, preventing or controlling type 2 diabetes, and reducing high blood pressure.  Vitamins and Minerals  Clinical staff conducted group or individual video education with verbal and written material and guidebook.  Patient learns different ways to obtain key vitamins and minerals, including through a recommended healthy diet. It is important to discuss all supplements you take with your doctor.   Healthy Mind-Set    Smoking Cessation  Clinical staff conducted group or individual video education with verbal and written material and guidebook.  Patient learns that cigarette smoking and tobacco addiction pose a serious health risk which affects millions of  people. Stopping smoking will significantly reduce the risk of heart disease, lung disease, and many forms of cancer. Recommended strategies for quitting are covered, including working with your doctor to develop a successful plan.  Culinary   Becoming a Set designer conducted group or individual video education with verbal and written material and guidebook.  Patient learns that cooking at home can be healthy, cost-effective, quick, and puts them in control. Keys to cooking healthy recipes will include looking at your recipe, assessing your equipment needs, planning ahead, making it simple, choosing cost-effective seasonal ingredients, and limiting the use of added fats, salts, and sugars.  Cooking - Breakfast and Snacks  Clinical staff conducted group or individual video education with verbal and written material and guidebook.  Patient learns how important breakfast is to satiety and nutrition through the entire day. Recommendations include key foods to eat during breakfast to help stabilize blood sugar levels and to prevent overeating at meals later in the day. Planning ahead is also a key component.  Cooking - Educational psychologist conducted group or individual video education with verbal and written material and guidebook.  Patient learns eating strategies to improve overall health, including an approach to cook more at home. Recommendations include thinking of animal protein as a side on your plate rather than center stage and focusing instead on lower calorie dense options like vegetables, fruits, whole grains, and plant-based proteins, such as beans. Making sauces in large quantities to freeze for later and leaving the skin on your vegetables are also recommended to maximize your experience.  Cooking - Healthy Salads and Dressing Clinical staff conducted group or individual video education with verbal and written material and guidebook.  Patient learns that  vegetables, fruits, whole grains, and legumes are the foundations of the Pritikin Eating Plan. Recommendations include how to incorporate each of these in flavorful and healthy salads, and how to create homemade salad dressings. Proper handling of ingredients is also covered. Cooking - Soups and [employer] - Soups  and Desserts Clinical staff conducted group or individual video education with verbal and written material and guidebook.  Patient learns that Pritikin soups and desserts make for easy, nutritious, and delicious snacks and meal components that are low in sodium, fat, sugar, and calorie density, while high in vitamins, minerals, and filling fiber. Recommendations include simple and healthy ideas for soups and desserts.   Overview     The Pritikin Solution Program Overview Clinical staff conducted group or individual video education with verbal and written material and guidebook.  Patient learns that the results of the Pritikin Program have been documented in more than 100 articles published in peer-reviewed journals, and the benefits include reducing risk factors for (and, in some cases, even reversing) high cholesterol, high blood pressure, type 2 diabetes, obesity, and more! An overview of the three key pillars of the Pritikin Program will be covered: eating well, doing regular exercise, and having a healthy mind-set.  WORKSHOPS  Exercise: Exercise Basics: Building Your Action Plan Clinical staff led group instruction and group discussion with PowerPoint presentation and patient guidebook. To enhance the learning environment the use of posters, models and videos may be added. At the conclusion of this workshop, patients will comprehend the difference between physical activity and exercise, as well as the benefits of incorporating both, into their routine. Patients will understand the FITT (Frequency, Intensity, Time, and Type) principle and how to use it to build an exercise action  plan. In addition, safety concerns and other considerations for exercise and cardiac rehab will be addressed by the presenter. The purpose of this lesson is to promote a comprehensive and effective weekly exercise routine in order to improve patients' overall level of fitness.   Managing Heart Disease: Your Path to a Healthier Heart Clinical staff led group instruction and group discussion with PowerPoint presentation and patient guidebook. To enhance the learning environment the use of posters, models and videos may be added.At the conclusion of this workshop, patients will understand the anatomy and physiology of the heart. Additionally, they will understand how Pritikin's three pillars impact the risk factors, the progression, and the management of heart disease.  The purpose of this lesson is to provide a high-level overview of the heart, heart disease, and how the Pritikin lifestyle positively impacts risk factors.  Exercise Biomechanics Clinical staff led group instruction and group discussion with PowerPoint presentation and patient guidebook. To enhance the learning environment the use of posters, models and videos may be added. Patients will learn how the structural parts of their bodies function and how these functions impact their daily activities, movement, and exercise. Patients will learn how to promote a neutral spine, learn how to manage pain, and identify ways to improve their physical movement in order to promote healthy living. The purpose of this lesson is to expose patients to common physical limitations that impact physical activity. Participants will learn practical ways to adapt and manage aches and pains, and to minimize their effect on regular exercise. Patients will learn how to maintain good posture while sitting, walking, and lifting.  Balance Training and Fall Prevention  Clinical staff led group instruction and group discussion with PowerPoint presentation and  patient guidebook. To enhance the learning environment the use of posters, models and videos may be added. At the conclusion of this workshop, patients will understand the importance of their sensorimotor skills (vision, proprioception, and the vestibular system) in maintaining their ability to balance as they age. Patients will apply a variety of balancing exercises  that are appropriate for their current level of function. Patients will understand the common causes for poor balance, possible solutions to these problems, and ways to modify their physical environment in order to minimize their fall risk. The purpose of this lesson is to teach patients about the importance of maintaining balance as they age and ways to minimize their risk of falling.  WORKSHOPS   Nutrition:  Fueling a Ship broker led group instruction and group discussion with PowerPoint presentation and patient guidebook. To enhance the learning environment the use of posters, models and videos may be added. Patients will review the foundational principles of the Pritikin Eating Plan and understand what constitutes a serving size in each of the food groups. Patients will also learn Pritikin-friendly foods that are better choices when away from home and review make-ahead meal and snack options. Calorie density will be reviewed and applied to three nutrition priorities: weight maintenance, weight loss, and weight gain. The purpose of this lesson is to reinforce (in a group setting) the key concepts around what patients are recommended to eat and how to apply these guidelines when away from home by planning and selecting Pritikin-friendly options. Patients will understand how calorie density may be adjusted for different weight management goals.  Mindful Eating  Clinical staff led group instruction and group discussion with PowerPoint presentation and patient guidebook. To enhance the learning environment the use of posters,  models and videos may be added. Patients will briefly review the concepts of the Pritikin Eating Plan and the importance of low-calorie dense foods. The concept of mindful eating will be introduced as well as the importance of paying attention to internal hunger signals. Triggers for non-hunger eating and techniques for dealing with triggers will be explored. The purpose of this lesson is to provide patients with the opportunity to review the basic principles of the Pritikin Eating Plan, discuss the value of eating mindfully and how to measure internal cues of hunger and fullness using the Hunger Scale. Patients will also discuss reasons for non-hunger eating and learn strategies to use for controlling emotional eating.  Targeting Your Nutrition Priorities Clinical staff led group instruction and group discussion with PowerPoint presentation and patient guidebook. To enhance the learning environment the use of posters, models and videos may be added. Patients will learn how to determine their genetic susceptibility to disease by reviewing their family history. Patients will gain insight into the importance of diet as part of an overall healthy lifestyle in mitigating the impact of genetics and other environmental insults. The purpose of this lesson is to provide patients with the opportunity to assess their personal nutrition priorities by looking at their family history, their own health history and current risk factors. Patients will also be able to discuss ways of prioritizing and modifying the Pritikin Eating Plan for their highest risk areas  Menu  Clinical staff led group instruction and group discussion with PowerPoint presentation and patient guidebook. To enhance the learning environment the use of posters, models and videos may be added. Using menus brought in from [employer], or printed from [employer], patients will apply the Pritikin dining out guidelines that were presented in the  [employer] video. Patients will also be able to practice these guidelines in a variety of provided scenarios. The purpose of this lesson is to provide patients with the opportunity to practice hands-on learning of the Pritikin Dining Out guidelines with actual menus and practice scenarios.  Label Reading Clinical  staff led group instruction and group discussion with PowerPoint presentation and patient guidebook. To enhance the learning environment the use of posters, models and videos may be added. Patients will review and discuss the Pritikin label reading guidelines presented in Pritikin's Label Reading Educational series video. Using fool labels brought in from local grocery stores and markets, patients will apply the label reading guidelines and determine if the packaged food meet the Pritikin guidelines. The purpose of this lesson is to provide patients with the opportunity to review, discuss, and practice hands-on learning of the Pritikin Label Reading guidelines with actual packaged food labels. Cooking School  Pritikin's [employer] are designed to teach patients ways to prepare quick, simple, and affordable recipes at home. The importance of nutrition's role in chronic disease risk reduction is reflected in its emphasis in the overall Pritikin program. By learning how to prepare essential core Pritikin Eating Plan recipes, patients will increase control over what they eat; be able to customize the flavor of foods without the use of added salt, sugar, or fat; and improve the quality of the food they consume. By learning a set of core recipes which are easily assembled, quickly prepared, and affordable, patients are more likely to prepare more healthy foods at home. These workshops focus on convenient breakfasts, simple entres, side dishes, and desserts which can be prepared with minimal effort and are consistent with nutrition recommendations for cardiovascular risk  reduction. Cooking [employer] are taught by a Armed forces logistics/support/administrative officer (RD) who has been trained by the [employer]. The chef or RD has a clear understanding of the importance of minimizing - if not completely eliminating - added fat, sugar, and sodium in recipes. Throughout the series of Cooking School Workshop sessions, patients will learn about healthy ingredients and efficient methods of cooking to build confidence in their capability to prepare    Cooking School weekly topics:  Adding Flavor- Sodium-Free  Fast and Healthy Breakfasts  Powerhouse Plant-Based Proteins  Satisfying Salads and Dressings  Simple Sides and Sauces  International Cuisine-Spotlight on the [employer] Zones  Delicious Desserts  Savory Soups  [employer] - Meals in a Astronomer Appetizers and Snacks  Comforting Weekend Breakfasts  One-Pot Wonders   Fast Evening Meals  Landscape architect Your Pritikin Plate  WORKSHOPS   Healthy Mindset (Psychosocial):  Focused Goals, Sustainable Changes Clinical staff led group instruction and group discussion with PowerPoint presentation and patient guidebook. To enhance the learning environment the use of posters, models and videos may be added. Patients will be able to apply effective goal setting strategies to establish at least one personal goal, and then take consistent, meaningful action toward that goal. They will learn to identify common barriers to achieving personal goals and develop strategies to overcome them. Patients will also gain an understanding of how our mind-set can impact our ability to achieve goals and the importance of cultivating a positive and growth-oriented mind-set. The purpose of this lesson is to provide patients with a deeper understanding of how to set and achieve personal goals, as well as the tools and strategies needed to overcome common obstacles which may arise along the way.  From Head to Heart: The  Power of a Healthy Outlook  Clinical staff led group instruction and group discussion with PowerPoint presentation and patient guidebook. To enhance the learning environment the use of posters, models and videos may be added. Patients will be able to recognize and describe the impact  of emotions and mood on physical health. They will discover the importance of self-care and explore self-care practices which may work for them. Patients will also learn how to utilize the 4 C's to cultivate a healthier outlook and better manage stress and challenges. The purpose of this lesson is to demonstrate to patients how a healthy outlook is an essential part of maintaining good health, especially as they continue their cardiac rehab journey.  Healthy Sleep for a Healthy Heart Clinical staff led group instruction and group discussion with PowerPoint presentation and patient guidebook. To enhance the learning environment the use of posters, models and videos may be added. At the conclusion of this workshop, patients will be able to demonstrate knowledge of the importance of sleep to overall health, well-being, and quality of life. They will understand the symptoms of, and treatments for, common sleep disorders. Patients will also be able to identify daytime and nighttime behaviors which impact sleep, and they will be able to apply these tools to help manage sleep-related challenges. The purpose of this lesson is to provide patients with a general overview of sleep and outline the importance of quality sleep. Patients will learn about a few of the most common sleep disorders. Patients will also be introduced to the concept of "sleep hygiene," and discover ways to self-manage certain sleeping problems through simple daily behavior changes. Finally, the workshop will motivate patients by clarifying the links between quality sleep and their goals of heart-healthy living.   Recognizing and Reducing Stress Clinical staff led  group instruction and group discussion with PowerPoint presentation and patient guidebook. To enhance the learning environment the use of posters, models and videos may be added. At the conclusion of this workshop, patients will be able to understand the types of stress reactions, differentiate between acute and chronic stress, and recognize the impact that chronic stress has on their health. They will also be able to apply different coping mechanisms, such as reframing negative self-talk. Patients will have the opportunity to practice a variety of stress management techniques, such as deep abdominal breathing, progressive muscle relaxation, and/or guided imagery.  The purpose of this lesson is to educate patients on the role of stress in their lives and to provide healthy techniques for coping with it.  Learning Barriers/Preferences:  Learning Barriers/Preferences - 09/08/23 1639       Learning Barriers/Preferences   Learning Barriers Hearing   hearing aids   Learning Preferences Group Instruction;Individual Instruction;Pictoral;Skilled Demonstration;Verbal Instruction;Written Material;Video             Education Topics:  Knowledge Questionnaire Score:  Knowledge Questionnaire Score - 09/08/23 1638       Knowledge Questionnaire Score   Pre Score 19/24             Core Components/Risk Factors/Patient Goals at Admission:  Personal Goals and Risk Factors at Admission - 09/08/23 1638       Core Components/Risk Factors/Patient Goals on Admission    Weight Management Yes;Weight Maintenance    Intervention Weight Management: Develop a combined nutrition and exercise program designed to reach desired caloric intake, while maintaining appropriate intake of nutrient and fiber, sodium and fats, and appropriate energy expenditure required for the weight goal.;Weight Management: Provide education and appropriate resources to help participant work on and attain dietary goals.    Expected  Outcomes Short Term: Continue to assess and modify interventions until short term weight is achieved;Long Term: Adherence to nutrition and physical activity/exercise program aimed toward attainment of established weight  goal;Weight Maintenance: Understanding of the daily nutrition guidelines, which includes 25-35% calories from fat, 7% or less cal from saturated fats, less than 200mg  cholesterol, less than 1.5gm of sodium, & 5 or more servings of fruits and vegetables daily;Understanding recommendations for meals to include 15-35% energy as protein, 25-35% energy from fat, 35-60% energy from carbohydrates, less than 200mg  of dietary cholesterol, 20-35 gm of total fiber daily;Understanding of distribution of calorie intake throughout the day with the consumption of 4-5 meals/snacks    Heart Failure Yes    Intervention Provide a combined exercise and nutrition program that is supplemented with education, support and counseling about heart failure. Directed toward relieving symptoms such as shortness of breath, decreased exercise tolerance, and extremity edema.    Expected Outcomes Improve functional capacity of life;Short term: Attendance in program 2-3 days a week with increased exercise capacity. Reported lower sodium intake. Reported increased fruit and vegetable intake. Reports medication compliance.;Short term: Daily weights obtained and reported for increase. Utilizing diuretic protocols set by physician.;Long term: Adoption of self-care skills and reduction of barriers for early signs and symptoms recognition and intervention leading to self-care maintenance.    Hypertension Yes    Intervention Provide education on lifestyle modifcations including regular physical activity/exercise, weight management, moderate sodium restriction and increased consumption of fresh fruit, vegetables, and low fat dairy, alcohol moderation, and smoking cessation.;Monitor prescription use compliance.    Expected Outcomes Short  Term: Continued assessment and intervention until BP is < 140/69mm HG in hypertensive participants. < 130/19mm HG in hypertensive participants with diabetes, heart failure or chronic kidney disease.;Long Term: Maintenance of blood pressure at goal levels.    Lipids Yes    Intervention Provide education and support for participant on nutrition & aerobic/resistive exercise along with prescribed medications to achieve LDL 70mg , HDL >40mg .    Expected Outcomes Short Term: Participant states understanding of desired cholesterol values and is compliant with medications prescribed. Participant is following exercise prescription and nutrition guidelines.;Long Term: Cholesterol controlled with medications as prescribed, with individualized exercise RX and with personalized nutrition plan. Value goals: LDL < 70mg , HDL > 40 mg.             Core Components/Risk Factors/Patient Goals Review:    Core Components/Risk Factors/Patient Goals at Discharge (Final Review):    ITP Comments:  ITP Comments     Row Name 09/08/23 1625           ITP Comments Dr. Armanda Magic medical director. Introduction to [facility] education/ intensive cardiac rehab. Initial orientation packet reviewed with patient.                Comments: Participant attended orientation for the cardiac rehabilitation program on  09/08/2023  to perform initial intake and exercise walk test. Patient introduced to the [facility] education and orientation packet was reviewed. Completed 6-minute walk test, measurements, initial ITP, and exercise prescription. Vital signs stable. Telemetry-normal sinus rhythm, symptomatic at halfway mark with acid reflux feeling, patient stated that is was normal and TUM's usually helps, completed walk test with no remarkable symptoms. Patient sat down and became SOB DYSx2, central chest pressure (heaviness) and cold. Nurse aware and obtained ECG, NP aware and reviewed symptoms with patient. Symptoms  resolved with rest. She had not eaten much today so gave water and graham crackers and peanut butter before heading home.   Service time was from 1314 to 1620.

## 2023-09-08 NOTE — Progress Notes (Signed)
At the completion of walk test pt complains of chest heaviness that initially started out feeling like indigestion 2 minutes into the walk.  Pt grimaces and states the discomfort is now bilateral throat.  Describes this as something new that she has not experienced before. BP 110/70 and oxygen saturation 100%.  Assisted to the treatment room and oxygen applied at [facility] and 12 lead EKG performed.  Sent chat message to supervising provider, Eligha Bridegroom NP.  Pt complained of feeling tired and had cereal this morning but had not eaten anything since.  Asked id this was usual did state she eats very little but generally would have had a snack by this time.  Marcelino Duster in see pt, reviewed 12 lead EKG. Felt not to be cardiac in nature.  Ok to start on Monday with exercise per [place].Pt with resolution of discomfort and had PB crackers during the remaining portion of the appt. Pt feels ok driving self home. Alanson Aly, BSN Cardiac and Emergency planning/management officer

## 2023-09-09 ENCOUNTER — Telehealth (HOSPITAL_COMMUNITY): Payer: Self-pay | Admitting: *Deleted

## 2023-09-09 NOTE — Telephone Encounter (Signed)
-----   Message from Georgie Chard sent at 09/09/2023 11:46 AM EDT ----- Thank you for the update. If this recurs as she continues with CRII I am happy to see her in clinic if needed. Take care- Noreene Larsson ----- Message ----- From: Chelsea Aus, RN Sent: 09/09/2023   9:44 AM EDT To: Filbert Schilder, NP; Tonny Bollman, MD  Lorain Childes, Sweet patient in for orientation for Cardiac rehab yesterday.  Had some chest heaviness after the 6 minute walk test that began more like indigestion.  Did 12 lead since she was post CABG 2007. Eligha Bridegroom was the supervising provider spoke with pt and reviewed EKG. Felt it was more gi related as she had only cereal that morning and this was an afternoon appt.   Alanson Aly, BSN Cardiac and Emergency planning/management officer

## 2023-09-12 ENCOUNTER — Encounter (HOSPITAL_COMMUNITY)
Admission: RE | Admit: 2023-09-12 | Discharge: 2023-09-12 | Disposition: A | Discharge status: Home / Self Care | Payer: Medicare Other | Source: Ambulatory Visit | Attending: Cardiovascular Disease | Admitting: Cardiovascular Disease

## 2023-09-12 DIAGNOSIS — Z952 Presence of prosthetic heart valve: Secondary | ICD-10-CM | POA: Diagnosis not present

## 2023-09-12 NOTE — Progress Notes (Signed)
Daily Session Note  Patient Details  Name: [name] MRN: [id number] Date of Birth: Aug 11, 1941 Referring Provider:   Flowsheet Row INTENSIVE CARDIAC REHAB ORIENT from 09/08/2023 in [facility]  Referring Provider Tonny Bollman, MD       Encounter Date: 09/12/2023  Check In:  Session Check In - 09/12/23 1616       Check-In   Supervising physician immediately available to respond to emergencies [facility] - Physician supervision    Physician(s) Robin Searing, NP    Location [facility]    Staff Present Hughie Closs BS, ACSM-CEP, Exercise Physiologist;Olinty Peggye Pitt, MS, ACSM-CEP, Exercise Physiologist;David Manus Gunning, MS, ACSM-CEP, CCRP, Exercise Physiologist;Other;Rube Sanchez, RN, BSN    Virtual Visit No    Medication changes reported     No    Fall or balance concerns reported    No    Tobacco Cessation No Change    Warm-up and Cool-down Performed as group-led instruction    Resistance Training Performed Yes    VAD Patient? No    PAD/SET Patient? No      Pain Assessment   Currently in Pain? No/denies    Pain Score 0-No pain    Multiple Pain Sites No             Capillary Blood Glucose: No results found. However, due to the size of the patient record, not all encounters were searched. Please check Results Review for a complete set of results.   Exercise Prescription Changes - 09/12/23 1636       Response to Exercise   Blood Pressure (Admit) 116/60    Blood Pressure (Exercise) 158/72    Blood Pressure (Exit) 132/70    Heart Rate (Admit) 69 bpm    Heart Rate (Exercise) 69 bpm    Heart Rate (Exit) 61 bpm    Rating of Perceived Exertion (Exercise) 7    Perceived Dyspnea (Exercise) 0    Symptoms none    Comments Pt first day in the [facility] ICR program    Duration Progress to 30 minutes of  aerobic without signs/symptoms of physical distress    Intensity THRR unchanged      Progression    Progression Continue to progress workloads to maintain intensity without signs/symptoms of physical distress.    Average METs 1.3      Resistance Training   Training Prescription Yes    Weight 1    Reps 10-15    Time 10 Minutes      NuStep   Level 1    SPM 67    Minutes 20    METs 1.3             Social History   Tobacco Use  Smoking Status Never  Smokeless Tobacco Never    Goals Met:  Exercise tolerated well No report of concerns or symptoms today Strength training completed today  Goals Unmet:  Not Applicable  Comments: Pt started cardiac rehab today.  Pt tolerated light exercise without difficulty. VSS, telemetry-Sinus Rhythm, asymptomatic.  Medication list reconciled. Pt denies barriers to medicaiton compliance.  PSYCHOSOCIAL ASSESSMENT:  PHQ-2. Pt exhibits positive coping skills, hopeful outlook with supportive family. No psychosocial needs identified at this time, no psychosocial interventions necessary.    Pt enjoys gardening and church.   Pt oriented to exercise equipment and routine.  Graison uses a cane for stability.  Understanding verbalized.Thayer Headings RN BSN

## 2023-09-14 ENCOUNTER — Encounter (HOSPITAL_COMMUNITY)
Admission: RE | Admit: 2023-09-14 | Discharge: 2023-09-14 | Disposition: A | Discharge status: Home / Self Care | Payer: Medicare Other | Source: Ambulatory Visit | Attending: Cardiovascular Disease | Admitting: Cardiovascular Disease

## 2023-09-14 ENCOUNTER — Telehealth: Payer: Self-pay

## 2023-09-14 DIAGNOSIS — Z952 Presence of prosthetic heart valve: Secondary | ICD-10-CM

## 2023-09-14 DIAGNOSIS — Z23 Encounter for immunization: Secondary | ICD-10-CM | POA: Diagnosis not present

## 2023-09-14 DIAGNOSIS — R079 Chest pain, unspecified: Secondary | ICD-10-CM

## 2023-09-14 NOTE — Progress Notes (Signed)
Patient is concerned that she having jerking in her right leg at night. Patient says she has intermittent numbness and is concerned that she may have a blood clot. Denies having pain presently on the nustep. Patient is wearing heavy stockings. Jan says her leg is tender to touch this is not new. Skin warm upon palpation. Right pedal pulse palpable 2+. Dr Estanislado Spire office was called and notified about the patient's symptoms. Patient spoke with the triage nurse over the phone. 1+ pedal edema present. The triage nurse will make an appointment for the patient to follow up in the office on Monday.Thayer Headings RN BSN

## 2023-09-14 NOTE — Telephone Encounter (Signed)
Caller: Byrd Hesselbach, RN at cardiac rehab, patient  Concern: numbness, jerking in leg, tenderness, electric shock, pt concerned about having a DVT  Byrd Hesselbach assessed leg, palpable 2+ pulse, extremity at normal warmth, slight swelling, no pain at this time  Pt denies any classic DVT symptoms  Discussed nerve pain symptoms with pt, but she needs reassurance  Location: right leg  Quality: shooting, with paresthesia, and electric shock  Resolution: Appointment scheduled for next available triage spot on Dr. Estanislado Spire office day  Next Appt: Appointment scheduled for 09/19/23 @ 1315 with PA

## 2023-09-16 ENCOUNTER — Encounter (HOSPITAL_COMMUNITY)
Admission: RE | Admit: 2023-09-16 | Discharge: 2023-09-16 | Disposition: A | Discharge status: Home / Self Care | Payer: Medicare Other | Source: Ambulatory Visit | Attending: Cardiovascular Disease | Admitting: Cardiovascular Disease

## 2023-09-16 DIAGNOSIS — R079 Chest pain, unspecified: Secondary | ICD-10-CM | POA: Diagnosis not present

## 2023-09-16 DIAGNOSIS — Z952 Presence of prosthetic heart valve: Secondary | ICD-10-CM | POA: Insufficient documentation

## 2023-09-19 ENCOUNTER — Ambulatory Visit (INDEPENDENT_AMBULATORY_CARE_PROVIDER_SITE_OTHER): Payer: Medicare Other | Admitting: Podiatry

## 2023-09-19 ENCOUNTER — Encounter: Payer: Self-pay | Admitting: Podiatry

## 2023-09-19 ENCOUNTER — Ambulatory Visit (INDEPENDENT_AMBULATORY_CARE_PROVIDER_SITE_OTHER): Payer: Medicare Other | Admitting: Physician Assistant

## 2023-09-19 ENCOUNTER — Encounter (HOSPITAL_COMMUNITY): Payer: Medicare Other

## 2023-09-19 VITALS — BP 148/64 | HR 58 | Temp 98.0°F | Ht 62.0 in | Wt 127.7 lb

## 2023-09-19 VITALS — Ht 62.0 in | Wt 127.0 lb

## 2023-09-19 DIAGNOSIS — I999 Unspecified disorder of circulatory system: Secondary | ICD-10-CM

## 2023-09-19 DIAGNOSIS — S75002D Unspecified injury of femoral artery, left leg, subsequent encounter: Secondary | ICD-10-CM | POA: Diagnosis not present

## 2023-09-19 DIAGNOSIS — L600 Ingrowing nail: Secondary | ICD-10-CM

## 2023-09-19 NOTE — Progress Notes (Unsigned)
Office Note     CC:  follow up Requesting Provider:  Pincus Sanes, MD  HPI: [name] is a 82 y.o. (1940-11-19) female who presents right femoral endarterectomy with bovine pericardial patch angioplasty, right femoral thrombectomy on 06/22/23 by Dr. Myra Gianotti. 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   She continues to  have swelling. She complains of pain as well. She is unable to describe it other than " just a pain". She occasionally has had some episodes of pins and needles. She does not elevate because she says she is "up all day long". She does wear some thigh high compression.   She continues to complain about generalized fatigue. She is in Cardiac rehab now.   Past Medical History:  Diagnosis Date   Allergy    SEASONAL   Anxiety    BREAST CANCER 1991   left, 1991; B mastectomy   CAD (coronary artery disease)    s/p CABG x5 in 2007   Cataract    BILATERAL-REMOVED   Depression    DIVERTICULOSIS    GERD    History of DVT (deep vein thrombosis)    HYPERLIPIDEMIA    Hypertension    HYPOTHYROIDISM    Occlusion and stenosis of carotid artery without mention of cerebral infarction    OSTEOPOROSIS    S/P TAVR (transcatheter aortic valve replacement) 06/21/2023   s/p TAVR with a 23mm Edwards S3UR via the TF approach by Dr. Clifton James & Dr. Leafy Ro   Severe aortic stenosis     Past Surgical History:  Procedure Laterality Date   APPENDECTOMY     COLONOSCOPY     CORONARY ARTERY BYPASS GRAFT     5        2007   ENDARTERECTOMY FEMORAL Right 06/22/2023   Procedure: RIGHT FEMORAL ENDARTERECTOMY WITH PATCH ANGIOPLASTY;  Surgeon: Nada Libman, MD;  Location: [facility] OR;  Service: Vascular;  Laterality: Right;   FEMORAL-POPLITEAL BYPASS GRAFT Right 06/22/2023   Procedure: FEMORAL THROMBECTOMY;  Surgeon: Nada Libman, MD;  Location: [facility] OR;  Service: Vascular;  Laterality: Right;   INTRAOPERATIVE  TRANSTHORACIC ECHOCARDIOGRAM N/A 06/21/2023   Procedure: INTRAOPERATIVE TRANSTHORACIC ECHOCARDIOGRAM;  Surgeon: Kathleene Hazel, MD;  Location: [facility] INVASIVE CV [facility];  Service: Open Heart Surgery;  Laterality: N/A;   MASTECTOMY     left with reconstruction and lymph node excision  1992   MASTECTOMY     right with reconstruction   PATCH ANGIOPLASTY Right 06/22/2023   Procedure: PATCH ANGIOPLASTY OF RIGHT FEMORAL ARTERY USING BOVINE PERICARDIAL PATCH;  Surgeon: Nada Libman, MD;  Location: [facility] OR;  Service: Vascular;  Laterality: Right;   REVISION RECONSTRUCTED BREAST Left 02/2014   willard (plastics in [place])   right ankle arthroscopy     RIGHT/LEFT HEART CATH AND CORONARY ANGIOGRAPHY N/A 06/09/2023   Procedure: RIGHT/LEFT HEART CATH AND CORONARY ANGIOGRAPHY;  Surgeon: Kathleene Hazel, MD;  Location[facility];  Service: Cardiovascular;  Laterality: N/A;   TEAR DUCT CANAL     TONSILLECTOMY AND ADENOIDECTOMY     TRANSCATHETER AORTIC VALVE REPLACEMENT, TRANSFEMORAL N/A 06/21/2023   Procedure: Transcatheter Aortic Valve Replacement, Transfemoral;  Surgeon: Kathleene Hazel, MD;  Location: [facility];  Service: Open Heart Surgery;  Laterality: N/A;   UPPER GASTROINTESTINAL ENDOSCOPY     VAGINAL HYSTERECTOMY     ovaries not excised    Social History   Socioeconomic History  Marital status: Divorced    Spouse name: Not on file   Number of children: 2   Years of education: Not on file   Highest education level: Not on file  Occupational History   Occupation: retired  Tobacco Use   Smoking status: Never   Smokeless tobacco: Never  Vaping Use   Vaping status: Never Used  Substance and Sexual Activity   Alcohol use: No    Alcohol/week: 0.0 standard drinks of alcohol   Drug use: No   Sexual activity: Not Currently  Other Topics Concern   Not on file  Social History Narrative   Divorced x 3, lives alone   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,  school psychologist   Right handed    Social Determinants of Health   Financial Resource Strain: Low Risk  (09/02/2022)   Overall Financial Resource Strain (CARDIA)    Difficulty of Paying Living Expenses: Not hard at all  Food Insecurity: No Food Insecurity (06/25/2023)   Hunger Vital Sign    Worried About Running Out of Food in the Last Year: Never true    Ran Out of Food in the Last Year: Never true  Transportation Needs: No Transportation Needs (06/25/2023)   PRAPARE - Administrator, Civil Service (Medical): No    Lack of Transportation (Non-Medical): No  Physical Activity: Inactive (09/02/2022)   Exercise Vital Sign    Days of Exercise per Week: 0 days    Minutes of Exercise per Session: 0 min  Stress: No Stress Concern Present (09/02/2022)   [employer] of Occupational Health - Occupational Stress Questionnaire    Feeling of Stress : Not at all  Social Connections: Moderately Integrated (09/02/2022)   Social Connection and Isolation Panel [NHANES]    Frequency of Communication with Friends and Family: More than three times a week    Frequency of Social Gatherings with Friends and Family: More than three times a week    Attends Religious Services: More than 4 times per year    Active Member of [employer] or Organizations: Yes    Attends Engineer, structural: More than 4 times per year    Marital Status: Divorced  Intimate Partner Violence: Not At Risk (06/25/2023)   Humiliation, Afraid, Rape, and Kick questionnaire    Fear of Current or Ex-Partner: No    Emotionally Abused: No    Physically Abused: No    Sexually Abused: No   *** Family History  Problem Relation Age of Onset   Colon cancer Sister    Colon cancer Sister    Breast cancer Mother    Lung disease Mother    Emphysema Father    Esophageal cancer Neg Hx    Rectal cancer Neg Hx    Stomach cancer Neg Hx    Thyroid disease Neg Hx     Current Outpatient Medications  Medication Sig  Dispense Refill   aspirin 81 MG tablet Take 81 mg by mouth daily.       atorvastatin (LIPITOR) 20 MG tablet TAKE 1 TABLET BY MOUTH EVERY DAY 90 tablet 1   Calcium Carbonate (CALTRATE 600 PO) Take 1 tablet by mouth 2 (two) times daily.       citalopram (CELEXA) 20 MG tablet TAKE 1 TABLET BY MOUTH EVERY DAY 90 tablet 2   co-enzyme Q-10 30 MG capsule Take 30 mg by mouth daily.     doxycycline (VIBRAMYCIN) 100 MG capsule Take 1 capsule (100 mg  total) by mouth as needed (Take two tablets by mouth one hour prior to dental appointments). (Patient not taking: Reported on 09/19/2023) 10 capsule 12   famotidine (PEPCID) 40 MG tablet TAKE 1 TABLET BY MOUTH EVERY DAY 90 tablet 1   fluticasone (FLONASE) 50 MCG/ACT nasal spray Place 1 spray into both nostrils daily as needed for allergies or rhinitis. 9.9 mL 5   furosemide (LASIX) 20 MG tablet TAKE 1 TABLET BY MOUTH EVERY DAY 90 tablet 0   levothyroxine (SYNTHROID) 75 MCG tablet TAKE 1 TABLET BY MOUTH EVERY MORNING BEFORE BREAKFAST 90 tablet 1   losartan (COZAAR) 50 MG tablet TAKE 1 TABLET BY MOUTH EVERY DAY 90 tablet 3   meloxicam (MOBIC) 7.5 MG tablet TAKE 1/2 TO 1 TABLET EVERY DAY 90 tablet 0   metoprolol tartrate (LOPRESSOR) 25 MG tablet TAKE 1 TABLET BY MOUTH TWICE A DAY 180 tablet 1   Multiple Vitamins-Minerals (MULTIVITAMIN) LIQD Take 1 tablet by mouth daily.     Multiple Vitamins-Minerals (PRESERVISION AREDS 2) CAPS Take 1 capsule by mouth 2 (two) times daily.     senna-docusate (SENOKOT-S) 8.6-50 MG per tablet Take 1 tablet by mouth daily.       VITAMIN D, CHOLECALCIFEROL, PO Take 1 tablet by mouth daily.     zolpidem (AMBIEN) 5 MG tablet TAKE 1 TABLET (5 MG TOTAL) BY MOUTH EVERY DAY AT BEDTIME AS NEEDED FOR SLEEP 30 tablet 0   No current facility-administered medications for this visit.    Allergies  Allergen Reactions   Clarithromycin Other (See Comments)    PATIENT UNSURE OF REACTION   Clindamycin Other (See Comments)   Codeine      REACTION: nausea   Levofloxacin     REACTION: nausea   Morphine Other (See Comments)    Nausea  Nausea    Naproxen     REACTION: nausea   Omeprazole Other (See Comments)    Made her jittery, made her feel weird, ?rash   Penicillins Other (See Comments) and Rash    REACTION: RASH REACTION: RASH    Pneumococcal Vaccines Rash    Small rash on arm at injection site.      REVIEW OF SYSTEMS:  *** [X]  denotes positive finding, [ ]  denotes negative finding Cardiac  Comments:  Chest pain or chest pressure:    Shortness of breath upon exertion:    Short of breath when lying flat:    Irregular heart rhythm:        Vascular    Pain in calf, thigh, or hip brought on by ambulation:    Pain in feet at night that wakes you up from your sleep:     Blood clot in your veins:    Leg swelling:         Pulmonary    Oxygen at home:    Productive cough:     Wheezing:         Neurologic    Sudden weakness in arms or legs:     Sudden numbness in arms or legs:     Sudden onset of difficulty speaking or slurred speech:    Temporary loss of vision in one eye:     Problems with dizziness:         Gastrointestinal    Blood in stool:     Vomited blood:         Genitourinary    Burning when urinating:     Blood in urine:  Psychiatric    Major depression:         Hematologic    Bleeding problems:    Problems with blood clotting too easily:        Skin    Rashes or ulcers:        Constitutional    Fever or chills:      PHYSICAL EXAMINATION:  Vitals:   09/19/23 1320  BP: (!) 148/64  Pulse: (!) 58  Temp: 98 F (36.7 C)  SpO2: 98%  Weight: 127 lb 11.2 oz (57.9 kg)  Height: 5\' 2"  (1.575 m)    General:  WDWN in NAD; vital signs documented above Gait: Not observed HENT: WNL, normocephalic Pulmonary: normal non-labored breathing , without Rales, rhonchi,  wheezing Cardiac: {Desc; regular/irreg:14544} HR Abdomen: soft, NT, no masses Skin: {With/Without:20273}  rashes Vascular Exam/Pulses: *** Extremities: {With/Without:20273} ischemic changes, {With/Without:20273} Gangrene , {With/Without:20273} cellulitis; {With/Without:20273} open wounds;  Musculoskeletal: no muscle wasting or atrophy  Neurologic: A&O X 3 Psychiatric:  The pt has {Desc; normal/abnormal:11317::"Normal"} affect.   Non-Invasive Vascular Imaging:   ***    ASSESSMENT/PLAN:: 82 y.o. female here for follow up for ***  - - She will follow up in 6 months with RLE arterial duplex and ABIs   Graceann Congress, PA-C [facility] [phone]  Clinic MD:   Myra Gianotti

## 2023-09-19 NOTE — Patient Instructions (Signed)

## 2023-09-21 ENCOUNTER — Encounter (HOSPITAL_COMMUNITY)
Admission: RE | Admit: 2023-09-21 | Discharge: 2023-09-21 | Disposition: A | Discharge status: Home / Self Care | Payer: Medicare Other | Source: Ambulatory Visit | Attending: Cardiovascular Disease | Admitting: Cardiovascular Disease

## 2023-09-21 DIAGNOSIS — Z952 Presence of prosthetic heart valve: Secondary | ICD-10-CM | POA: Diagnosis not present

## 2023-09-21 DIAGNOSIS — R079 Chest pain, unspecified: Secondary | ICD-10-CM | POA: Diagnosis not present

## 2023-09-21 NOTE — Progress Notes (Signed)
Subjective:   Patient ID: [name], female   DOB: 82 y.o.   MRN: [id number]   HPI Patient presents concerned about some detachment of the right hallux nail as she is going through quite a bit currently   ROS      Objective:  Physical Exam  Neurovascular status mildly diminished with patient having had pathology in the right lower leg with a thickened right hallux nail that is partially detached in the distal two thirds     Assessment:  Circulatory distress right that is been addressed but left some residual with thickened nail moderately painful     Plan:  H&P done reviewed very conservative treatment and debridement accomplished no iatrogenic bleeding soaks and Band-Aid can be used may require other treatments if symptoms persist

## 2023-09-23 ENCOUNTER — Encounter (HOSPITAL_COMMUNITY)
Admission: RE | Admit: 2023-09-23 | Discharge: 2023-09-23 | Disposition: A | Discharge status: Home / Self Care | Payer: Medicare Other | Source: Ambulatory Visit | Attending: Cardiovascular Disease | Admitting: Cardiovascular Disease

## 2023-09-23 DIAGNOSIS — Z952 Presence of prosthetic heart valve: Secondary | ICD-10-CM | POA: Diagnosis not present

## 2023-09-23 DIAGNOSIS — R079 Chest pain, unspecified: Secondary | ICD-10-CM | POA: Diagnosis not present

## 2023-09-26 ENCOUNTER — Encounter (HOSPITAL_COMMUNITY)
Admission: RE | Admit: 2023-09-26 | Discharge: 2023-09-26 | Disposition: A | Discharge status: Home / Self Care | Payer: Medicare Other | Source: Ambulatory Visit | Attending: Cardiovascular Disease | Admitting: Cardiovascular Disease

## 2023-09-26 DIAGNOSIS — Z952 Presence of prosthetic heart valve: Secondary | ICD-10-CM | POA: Diagnosis not present

## 2023-09-26 DIAGNOSIS — R079 Chest pain, unspecified: Secondary | ICD-10-CM | POA: Diagnosis not present

## 2023-09-28 ENCOUNTER — Encounter (HOSPITAL_COMMUNITY)
Admission: RE | Admit: 2023-09-28 | Discharge: 2023-09-28 | Disposition: A | Discharge status: Home / Self Care | Payer: Medicare Other | Source: Ambulatory Visit | Attending: Cardiovascular Disease

## 2023-09-28 DIAGNOSIS — Z952 Presence of prosthetic heart valve: Secondary | ICD-10-CM | POA: Diagnosis not present

## 2023-09-28 DIAGNOSIS — R079 Chest pain, unspecified: Secondary | ICD-10-CM | POA: Diagnosis not present

## 2023-09-30 ENCOUNTER — Encounter (HOSPITAL_COMMUNITY)
Admission: RE | Admit: 2023-09-30 | Discharge: 2023-09-30 | Disposition: A | Discharge status: Home / Self Care | Payer: Medicare Other | Source: Ambulatory Visit | Attending: Cardiovascular Disease | Admitting: Cardiovascular Disease

## 2023-09-30 DIAGNOSIS — R079 Chest pain, unspecified: Secondary | ICD-10-CM | POA: Diagnosis not present

## 2023-09-30 DIAGNOSIS — Z952 Presence of prosthetic heart valve: Secondary | ICD-10-CM | POA: Diagnosis not present

## 2023-10-03 ENCOUNTER — Encounter (HOSPITAL_COMMUNITY)
Admission: RE | Admit: 2023-10-03 | Discharge: 2023-10-03 | Disposition: A | Discharge status: Home / Self Care | Payer: Medicare Other | Source: Ambulatory Visit | Attending: Cardiovascular Disease | Admitting: Cardiovascular Disease

## 2023-10-03 DIAGNOSIS — R079 Chest pain, unspecified: Secondary | ICD-10-CM | POA: Diagnosis not present

## 2023-10-03 DIAGNOSIS — Z952 Presence of prosthetic heart valve: Secondary | ICD-10-CM | POA: Diagnosis not present

## 2023-10-04 NOTE — Progress Notes (Signed)
Cardiac Individual Treatment Plan  Patient Details  Name: [name] MRN: [id number] Date of Birth: 1941/04/01 Referring Provider:   Flowsheet Row INTENSIVE CARDIAC REHAB ORIENT from 09/08/2023 in [facility]  Referring Provider Tonny Bollman, MD       Initial Encounter Date:  Flowsheet Row INTENSIVE CARDIAC REHAB ORIENT from 09/08/2023 in [facility]  Date 09/08/23       Visit Diagnosis: 06/21/23 S/P TAVR (transcatheter aortic valve replacement)  Patient's Home Medications on Admission:  Current Outpatient Medications:    aspirin 81 MG tablet, Take 81 mg by mouth daily.  , Disp: , Rfl:    atorvastatin (LIPITOR) 20 MG tablet, TAKE 1 TABLET BY MOUTH EVERY DAY, Disp: 90 tablet, Rfl: 1   Calcium Carbonate (CALTRATE 600 PO), Take 1 tablet by mouth 2 (two) times daily.  , Disp: , Rfl:    citalopram (CELEXA) 20 MG tablet, TAKE 1 TABLET BY MOUTH EVERY DAY, Disp: 90 tablet, Rfl: 2   co-enzyme Q-10 30 MG capsule, Take 30 mg by mouth daily., Disp: , Rfl:    doxycycline (VIBRAMYCIN) 100 MG capsule, Take 1 capsule (100 mg total) by mouth as needed (Take two tablets by mouth one hour prior to dental appointments)., Disp: 10 capsule, Rfl: 12   famotidine (PEPCID) 40 MG tablet, TAKE 1 TABLET BY MOUTH EVERY DAY, Disp: 90 tablet, Rfl: 1   fluticasone (FLONASE) 50 MCG/ACT nasal spray, Place 1 spray into both nostrils daily as needed for allergies or rhinitis., Disp: 9.9 mL, Rfl: 5   furosemide (LASIX) 20 MG tablet, TAKE 1 TABLET BY MOUTH EVERY DAY, Disp: 90 tablet, Rfl: 0   levothyroxine (SYNTHROID) 75 MCG tablet, TAKE 1 TABLET BY MOUTH EVERY MORNING BEFORE BREAKFAST, Disp: 90 tablet, Rfl: 1   losartan (COZAAR) 50 MG tablet, TAKE 1 TABLET BY MOUTH EVERY DAY, Disp: 90 tablet, Rfl: 3   meloxicam (MOBIC) 7.5 MG tablet, TAKE 1/2 TO 1 TABLET EVERY DAY, Disp: 90 tablet, Rfl: 0    metoprolol tartrate (LOPRESSOR) 25 MG tablet, TAKE 1 TABLET BY MOUTH TWICE A DAY, Disp: 180 tablet, Rfl: 1   Multiple Vitamins-Minerals (MULTIVITAMIN) LIQD, Take 1 tablet by mouth daily., Disp: , Rfl:    Multiple Vitamins-Minerals (PRESERVISION AREDS 2) CAPS, Take 1 capsule by mouth 2 (two) times daily., Disp: , Rfl:    senna-docusate (SENOKOT-S) 8.6-50 MG per tablet, Take 1 tablet by mouth daily.  , Disp: , Rfl:    VITAMIN D, CHOLECALCIFEROL, PO, Take 1 tablet by mouth daily., Disp: , Rfl:    zolpidem (AMBIEN) 5 MG tablet, TAKE 1 TABLET (5 MG TOTAL) BY MOUTH EVERY DAY AT BEDTIME AS NEEDED FOR SLEEP, Disp: 30 tablet, Rfl: 0  Past Medical History: Past Medical History:  Diagnosis Date   Allergy    SEASONAL   Anxiety    BREAST CANCER 1991   left, 1991; B mastectomy   CAD (coronary artery disease)    s/p CABG x5 in 2007   Cataract    BILATERAL-REMOVED   Depression    DIVERTICULOSIS    GERD    History of DVT (deep vein thrombosis)    HYPERLIPIDEMIA    Hypertension    HYPOTHYROIDISM    Occlusion and stenosis of carotid artery without mention of cerebral infarction    OSTEOPOROSIS    S/P TAVR (transcatheter aortic valve replacement) 06/21/2023   s/p TAVR with a  23mm Edwards S3UR via the TF approach by Dr. Clifton James & Dr. Leafy Ro   Severe aortic stenosis     Tobacco Use: Social History   Tobacco Use  Smoking Status Never  Smokeless Tobacco Never    Labs: Review Flowsheet  More data exists      Latest Ref Rng & Units 08/04/2022 02/02/2023 06/09/2023 06/21/2023 06/22/2023  Labs for ITP Cardiac and Pulmonary Rehab  Cholestrol 0 - 200 mg/dL 604  540  - - -  LDL (calc) 0 - 99 mg/dL 61  61  - - -  HDL-C >98.11 mg/dL 91.47  82.95  - - -  Trlycerides 0.0 - 149.0 mg/dL 62.1  30.8  - - -  Hemoglobin A1c 4.6 - 6.5 % 5.0  5.9  - - -  PH, Arterial 7.35 - 7.45 - - 7.414  - 7.360   PCO2 arterial 32 - 48 mmHg - - 33.5  - 47.6   Bicarbonate 20.0 - 28.0 mmol/L - - 21.5  17.3  - 26.9   TCO2 22 -  32 mmol/L - - 22  19  28  28    Acid-base deficit 0.0 - 2.0 mmol/L - - 2.0  10.0  - -  O2 Saturation % - - 96  60  - 100     Details       Multiple values from one day are sorted in reverse-chronological order         Capillary Blood Glucose: Lab Results  Component Value Date   GLUCAP 93 06/26/2023   GLUCAP 89 09/27/2022     Exercise Target Goals: Exercise Program Goal: Individual exercise prescription set using results from initial 6 min walk test and THRR while considering  patient's activity barriers and safety.   Exercise Prescription Goal: Initial exercise prescription builds to 30-45 minutes a day of aerobic activity, 2-3 days per week.  Home exercise guidelines will be given to patient during program as part of exercise prescription that the participant will acknowledge.  Activity Barriers & Risk Stratification:  Activity Barriers & Cardiac Risk Stratification - 09/08/23 1628       Activity Barriers & Cardiac Risk Stratification   Activity Barriers Joint Problems;Deconditioning;Chest Pain/Angina;Balance Concerns;Decreased Ventricular Function;Muscular Weakness;Assistive Device    Cardiac Risk Stratification High   <5 METs on            6 Minute Walk:  6 Minute Walk     Row Name 09/08/23 1633         6 Minute Walk   Phase Initial     Distance 720 feet     Walk Time 6 minutes     # of Rest Breaks 0     MPH 1.36     METS 1.22     RPE 9     Perceived Dyspnea  2     VO2 Peak 4.27     Symptoms Yes (comment)     Comments acid reflux during walk, turned into CP and SOB- RN Carlette consulted. See notes for details.     Resting HR 56 bpm     Resting BP 114/62     Resting Oxygen Saturation  95 %     Exercise Oxygen Saturation  during 6 min walk 100 %     Max Ex. HR 71 bpm     Max Ex. BP 158/62     2 Minute Post BP 110/62  Oxygen Initial Assessment:   Oxygen Re-Evaluation:   Oxygen Discharge (Final Oxygen  Re-Evaluation):   Initial Exercise Prescription:  Initial Exercise Prescription - 09/08/23 1600       Date of Initial Exercise RX and Referring Provider   Date 09/08/23    Referring Provider Tonny Bollman, MD    Expected Discharge Date 11/29/22      NuStep   Level 1    SPM 55    Minutes 20    METs 1.8      Prescription Details   Frequency (times per week) 3    Duration Progress to 30 minutes of continuous aerobic without signs/symptoms of physical distress      Intensity   THRR 40-80% of Max Heartrate 54-109    Ratings of Perceived Exertion 11-13    Perceived Dyspnea 0-4      Progression   Progression Continue progressive overload as per policy without signs/symptoms or physical distress.      Resistance Training   Training Prescription Yes    Weight 1    Reps 10-15             Perform Capillary Blood Glucose checks as needed.  Exercise Prescription Changes:   Exercise Prescription Changes     Row Name 09/12/23 1636 09/26/23 1656           Response to Exercise   Blood Pressure (Admit) 116/60 120/66      Blood Pressure (Exercise) 158/72 138/56      Blood Pressure (Exit) 132/70 120/70      Heart Rate (Admit) 69 bpm 73 bpm      Heart Rate (Exercise) 69 bpm 71 bpm      Heart Rate (Exit) 61 bpm 60 bpm      Rating of Perceived Exertion (Exercise) 7 15      Perceived Dyspnea (Exercise) 0 0      Symptoms none none      Comments Pt first day in the [facility] program Reviewed MET's, goals and home ExRx      Duration Progress to 30 minutes of  aerobic without signs/symptoms of physical distress Progress to 30 minutes of  aerobic without signs/symptoms of physical distress      Intensity THRR unchanged THRR unchanged        Progression   Progression Continue to progress workloads to maintain intensity without signs/symptoms of physical distress. Continue to progress workloads to maintain intensity without signs/symptoms of physical distress.      Average  METs 1.3 1.6        Resistance Training   Training Prescription Yes Yes      Weight 1 1      Reps 10-15 10-15      Time 10 Minutes 10 Minutes        NuStep   Level 1 1      SPM 67 90      Minutes 20 20      METs 1.3 1.6               Exercise Comments:   Exercise Comments     Row Name 09/12/23 1643 09/26/23 1701         Exercise Comments Pt first day in the [facility] program. Pt tolerated low intensity exercise well with an average MET level of 1.3. Pt is learning her THRR, RPE and ExRx Pt first day in the [facility] program. Pt tolerated low intensity exercise well with an average MET level  of 1.6. Pt is progressing well, she is increasing her MET's and is now able to achieve 30 mins of aerobic exercise. Talked about increasing depth of step, SPM and then soon increase resistance when ready. Right now exercise and ADL's are enough for safe exercise, will continue to work on balance before revieweing home ExRx               Exercise Goals and Review:   Exercise Goals     Row Name 09/08/23 1626             Exercise Goals   Increase Physical Activity Yes       Intervention Provide advice, education, support and counseling about physical activity/exercise needs.;Develop an individualized exercise prescription for aerobic and resistive training based on initial evaluation findings, risk stratification, comorbidities and participant's personal goals.       Expected Outcomes Short Term: Attend rehab on a regular basis to increase amount of physical activity.;Long Term: Exercising regularly at least 3-5 days a week.;Long Term: Add in home exercise to make exercise part of routine and to increase amount of physical activity.       Increase Strength and Stamina Yes       Intervention Provide advice, education, support and counseling about physical activity/exercise needs.;Develop an individualized exercise prescription for aerobic and resistive training based on initial  evaluation findings, risk stratification, comorbidities and participant's personal goals.       Expected Outcomes Short Term: Increase workloads from initial exercise prescription for resistance, speed, and METs.;Short Term: Perform resistance training exercises routinely during rehab and add in resistance training at home;Long Term: Improve cardiorespiratory fitness, muscular endurance and strength as measured by increased METs and functional capacity ( )       Able to understand and use rate of perceived exertion (RPE) scale Yes       Intervention Provide education and explanation on how to use RPE scale       Expected Outcomes Short Term: Able to use RPE daily in rehab to express subjective intensity level;Long Term:  Able to use RPE to guide intensity level when exercising independently       Knowledge and understanding of Target Heart Rate Range (THRR) Yes       Intervention Provide education and explanation of THRR including how the numbers were predicted and where they are located for reference       Expected Outcomes Short Term: Able to state/look up THRR;Short Term: Able to use daily as guideline for intensity in rehab;Long Term: Able to use THRR to govern intensity when exercising independently       Understanding of Exercise Prescription Yes       Intervention Provide education, explanation, and written materials on patient's individual exercise prescription       Expected Outcomes Short Term: Able to explain program exercise prescription;Long Term: Able to explain home exercise prescription to exercise independently                Exercise Goals Re-Evaluation :  Exercise Goals Re-Evaluation     Row Name 09/12/23 1640 09/26/23 1658           Exercise Goal Re-Evaluation   Exercise Goals Review Increase Physical Activity;Understanding of Exercise Prescription;Increase Strength and Stamina;Knowledge and understanding of Target Heart Rate Range (THRR);Able to understand and use  rate of perceived exertion (RPE) scale Increase Physical Activity;Understanding of Exercise Prescription;Increase Strength and Stamina;Knowledge and understanding of Target Heart Rate Range (THRR);Able to understand and use  rate of perceived exertion (RPE) scale      Comments Pt first day in the [facility] program. Pt tolerated low intensity exercise well with an average MET level of 1.3. Pt is learning her THRR, RPE and ExRx Pt first day in the [facility] program. Pt tolerated low intensity exercise well with an average MET level of 1.6. Pt is progressing well, she is increasing her MET's and is now able to achieve 30 mins of aerobic exercise. Talked about increasing depth of step, SPM and then soon increase resistance when ready. Right now exercise and ADL's are enough for safe exercise, will continue to work on balance before revieweing home ExRx      Expected Outcomes Will continue to monitor pt and progress workloads as tolerated without sign or symptom Will continue to monitor pt and progress workloads as tolerated without sign or symptom               Discharge Exercise Prescription (Final Exercise Prescription Changes):  Exercise Prescription Changes - 09/26/23 1656       Response to Exercise   Blood Pressure (Admit) 120/66    Blood Pressure (Exercise) 138/56    Blood Pressure (Exit) 120/70    Heart Rate (Admit) 73 bpm    Heart Rate (Exercise) 71 bpm    Heart Rate (Exit) 60 bpm    Rating of Perceived Exertion (Exercise) 15    Perceived Dyspnea (Exercise) 0    Symptoms none    Comments Reviewed MET's, goals and home ExRx    Duration Progress to 30 minutes of  aerobic without signs/symptoms of physical distress    Intensity THRR unchanged      Progression   Progression Continue to progress workloads to maintain intensity without signs/symptoms of physical distress.    Average METs 1.6      Resistance Training   Training Prescription Yes    Weight 1    Reps 10-15    Time  10 Minutes      NuStep   Level 1    SPM 90    Minutes 20    METs 1.6             Nutrition:  Target Goals: Understanding of nutrition guidelines, daily intake of sodium 1500mg , cholesterol 200mg , calories 30% from fat and 7% or less from saturated fats, daily to have 5 or more servings of fruits and vegetables.  Biometrics:  Pre Biometrics - 09/08/23 1636       Pre Biometrics   Height 5\' 4"  (1.626 m)    Weight 58.1 kg    Waist Circumference 32.5 inches    Hip Circumference 38 inches    Waist to Hip Ratio 0.86 %    BMI (Calculated) 21.98    Triceps Skinfold 20 mm    % Body Fat 34.6 %    Grip Strength 18 kg    Flexibility 0 in   not done   Single Leg Stand 0 seconds   not done             Nutrition Therapy Plan and Nutrition Goals:  Nutrition Therapy & Goals - 09/12/23 1551       Nutrition Therapy   Diet Heart Healthy Diet    Drug/Food Interactions Statins/Certain Fruits      Personal Nutrition Goals   Nutrition Goal Patient to identify strategies for reducing cardiovascular risk by attending the [facility] education and nutrition series weekly.    Personal Goal #2 Patient to  improve diet quality by using the plate method as a guide for meal planning to include lean protein/plant protein, fruits, vegetables, whole grains, nonfat dairy as part of a well-balanced diet.    Comments Tamarie reports no nutrition concerns at this time. She is not interested in making significant nutrition changes related to advanced age; she enjoys sugar and does eat out occasionally. She has medical history of s/p TAVR, CABG x5 (in 2007), HTN, hyperlipidemia. Her LDL remains well controlled <70. Patient will benefit from participation in intensive cardiac rehab for nutrition, exercise, and lifestyle modification.      Intervention Plan   Intervention Prescribe, educate and counsel regarding individualized specific dietary modifications aiming towards targeted core components such as  weight, hypertension, lipid management, diabetes, heart failure and other comorbidities.;Nutrition handout(s) given to patient.    Expected Outcomes Short Term Goal: Understand basic principles of dietary content, such as calories, fat, sodium, cholesterol and nutrients.;Long Term Goal: Adherence to prescribed nutrition plan.             Nutrition Assessments:  Nutrition Assessments - 09/15/23 0841       Rate Your Plate Scores   Pre Score 49            MEDIFICTS Score Key: >=70 Need to make dietary changes  40-70 Heart Healthy Diet <= 40 Therapeutic Level Cholesterol Diet   Flowsheet Row INTENSIVE CARDIAC REHAB from 09/14/2023 in [facility]  Picture Your Plate Total Score on Admission 49      Picture Your Plate Scores: <82 Unhealthy dietary pattern with much room for improvement. 41-50 Dietary pattern unlikely to meet recommendations for good health and room for improvement. 51-60 More healthful dietary pattern, with some room for improvement.  >60 Healthy dietary pattern, although there may be some specific behaviors that could be improved.    Nutrition Goals Re-Evaluation:  Nutrition Goals Re-Evaluation     Row Name 09/12/23 1551             Goals   Current Weight 128 lb 1.4 oz (58.1 kg)       Comment lipids WNL, LDL 61, A1c 5.9       Expected Outcome Veleta reports no nutrition concerns at this time. She is not interested in making significant nutrition changes related to advanced age; she enjoys sugar and does eat out occasionally. She has medical history of s/p TAVR, CABG x5 (in 2007), HTN, hyperlipidemia. Her LDL remains well controlled <70. Patient will benefit from participation in intensive cardiac rehab for nutrition, exercise, and lifestyle modification.                Nutrition Goals Re-Evaluation:  Nutrition Goals Re-Evaluation     Row Name 09/12/23 1551             Goals    Current Weight 128 lb 1.4 oz (58.1 kg)       Comment lipids WNL, LDL 61, A1c 5.9       Expected Outcome Melyssa reports no nutrition concerns at this time. She is not interested in making significant nutrition changes related to advanced age; she enjoys sugar and does eat out occasionally. She has medical history of s/p TAVR, CABG x5 (in 2007), HTN, hyperlipidemia. Her LDL remains well controlled <70. Patient will benefit from participation in intensive cardiac rehab for nutrition, exercise, and lifestyle modification.  Nutrition Goals Discharge (Final Nutrition Goals Re-Evaluation):  Nutrition Goals Re-Evaluation - 09/12/23 1551       Goals   Current Weight 128 lb 1.4 oz (58.1 kg)    Comment lipids WNL, LDL 61, A1c 5.9    Expected Outcome Devonna reports no nutrition concerns at this time. She is not interested in making significant nutrition changes related to advanced age; she enjoys sugar and does eat out occasionally. She has medical history of s/p TAVR, CABG x5 (in 2007), HTN, hyperlipidemia. Her LDL remains well controlled <70. Patient will benefit from participation in intensive cardiac rehab for nutrition, exercise, and lifestyle modification.             Psychosocial: Target Goals: Acknowledge presence or absence of significant depression and/or stress, maximize coping skills, provide positive support system. Participant is able to verbalize types and ability to use techniques and skills needed for reducing stress and depression.  Initial Review & Psychosocial Screening:  Initial Psych Review & Screening - 09/08/23 1638       Initial Review   Current issues with None Identified      Family Dynamics   Good Support System? Yes   Patient is feeling good and is well supported by her friends, church family and family     Barriers   Psychosocial barriers to participate in program There are no identifiable barriers or psychosocial needs.      Screening  Interventions   Interventions Encouraged to exercise;Provide feedback about the scores to participant    Expected Outcomes Long Term Goal: Stressors or current issues are controlled or eliminated.;Short Term goal: Identification and review with participant of any Quality of Life or Depression concerns found by scoring the questionnaire.;Long Term goal: The participant improves quality of Life and PHQ9 Scores as seen by post scores and/or verbalization of changes             Quality of Life Scores:  Quality of Life - 09/08/23 1637       Quality of Life   Select Quality of Life      Quality of Life Scores   Health/Function Pre 22.15 %    Socioeconomic Pre 25 %    Psych/Spiritual Pre 30 %    Family Pre 30 %    GLOBAL Pre 25.98 %            Scores of 19 and below usually indicate a poorer quality of life in these areas.  A difference of  2-3 points is a clinically meaningful difference.  A difference of 2-3 points in the total score of the Quality of Life Index has been associated with significant improvement in overall quality of life, self-image, physical symptoms, and general health in studies assessing change in quality of life.  PHQ-9: Review Flowsheet  More data exists      09/08/2023 02/02/2023 08/04/2022 08/31/2021 08/27/2020  Depression screen PHQ 2/9  Decreased Interest 0 0 0 0 0  Down, Depressed, Hopeless 0 0 0 0 0  PHQ - 2 Score 0 0 0 0 0  Altered sleeping 0 - - - -  Tired, decreased energy 2 - - - -  Change in appetite 0 - - - -  Feeling bad or failure about yourself  0 - - - -  Trouble concentrating 0 - - - -  Moving slowly or fidgety/restless 0 - - - -  Suicidal thoughts 0 - - - -  PHQ-9 Score 2 - - - -  Difficult doing work/chores Not difficult at all - - - -    Details           Interpretation of Total Score  Total Score Depression Severity:  1-4 = Minimal depression, 5-9 = Mild depression, 10-14 = Moderate depression, 15-19 = Moderately severe  depression, 20-27 = Severe depression   Psychosocial Evaluation and Intervention:   Psychosocial Re-Evaluation:  Psychosocial Re-Evaluation     Row Name 09/12/23 1653 10/04/23 1552           Psychosocial Re-Evaluation   Current issues with None Identified Current Stress Concerns      Comments -- Jan did have some concerns about right leg pain and swelling when she first started participating in the program. Jan has since been evaluated and has no further concerns.      Expected Outcomes -- Jan will have controlled or decreased stressors upon completion of cardiac rehab      Interventions Encouraged to attend Cardiac Rehabilitation for the exercise Encouraged to attend Cardiac Rehabilitation for the exercise      Continue Psychosocial Services  No Follow up required No Follow up required        Initial Review   Source of Stress Concerns -- Chronic Illness      Comments -- Will continue to montior and offer support as needed.               Psychosocial Discharge (Final Psychosocial Re-Evaluation):  Psychosocial Re-Evaluation - 10/04/23 1552       Psychosocial Re-Evaluation   Current issues with Current Stress Concerns    Comments Jan did have some concerns about right leg pain and swelling when she first started participating in the program. Jan has since been evaluated and has no further concerns.    Expected Outcomes Jan will have controlled or decreased stressors upon completion of cardiac rehab    Interventions Encouraged to attend Cardiac Rehabilitation for the exercise    Continue Psychosocial Services  No Follow up required      Initial Review   Source of Stress Concerns Chronic Illness    Comments Will continue to montior and offer support as needed.             Vocational Rehabilitation: Provide vocational rehab assistance to qualifying candidates.   Vocational Rehab Evaluation & Intervention:  Vocational Rehab - 09/08/23 1635       Initial Vocational  Rehab Evaluation & Intervention   Assessment shows need for Vocational Rehabilitation No   patient is retired. No needs            Education: Education Goals: Education classes will be provided on a weekly basis, covering required topics. Participant will state understanding/return demonstration of topics presented.    Education     Row Name 09/12/23 1600     Education   Cardiac Education Topics [facility]   Geographical information systems officer Psychosocial   Psychosocial Workshop Healthy Sleep for a Healthy Heart   Instruction Review Code 1- Verbalizes Understanding   Class Start Time 1400   Class Stop Time 1450   Class Time Calculation (min) 50 min    Row Name 09/14/23 1600     Education   Cardiac Education Topics [facility]   Customer service manager   Weekly Topic Simple Sides and Sauces   Instruction Review Code 1- [employer] Understanding  Class Start Time 1400   Class Stop Time 1440   Class Time Calculation (min) 40 min    Row Name 09/16/23 1400     Education   Cardiac Education Topics [facility]   Select Core Videos     Core Videos   Educator Exercise Physiologist   Select Psychosocial   Psychosocial How Our Thoughts Can Heal Our Hearts   Instruction Review Code 1- Verbalizes Understanding   Class Start Time 1355   Class Stop Time 1428   Class Time Calculation (min) 33 min    Row Name 09/21/23 1600     Education   Cardiac Education Topics Pritikin   Customer service manager   Weekly Topic Powerhouse Plant-Based Proteins   Instruction Review Code 1- Verbalizes Understanding   Class Start Time 1400   Class Stop Time 1438   Class Time Calculation (min) 38 min    Row Name 09/23/23 1400     Education   Cardiac Education Topics Pritikin   Hospital doctor Education    General Education Hypertension and Heart Disease   Instruction Review Code 1- Verbalizes Understanding   Class Start Time 1357   Class Stop Time 1430   Class Time Calculation (min) 33 min    Row Name 09/26/23 1700     Education   Cardiac Education Topics Pritikin   Geographical information systems officer Psychosocial   Psychosocial Workshop From Head to Heart: The Power of a Healthy Outlook   Instruction Review Code 1- Verbalizes Understanding   Class Start Time 1400   Class Stop Time 1450   Class Time Calculation (min) 50 min    Row Name 09/28/23 1500     Education   Cardiac Education Topics Pritikin   Customer service manager   Weekly Topic Adding Flavor - Sodium-Free   Instruction Review Code 1- Verbalizes Understanding   Class Start Time 1400   Class Stop Time 1440   Class Time Calculation (min) 40 min    Row Name 09/30/23 1500     Education   Cardiac Education Topics Pritikin   Hospital doctor Education   General Education Heart Disease Risk Reduction   Instruction Review Code 1- Verbalizes Understanding   Class Start Time 1400   Class Stop Time 1433   Class Time Calculation (min) 33 min    Row Name 10/03/23 1600     Education   Cardiac Education Topics Pritikin   [employer] Workshops     Workshops   Educator Exercise Physiologist   Select Exercise   Exercise Workshop Location manager and Fall Prevention   Instruction Review Code 1- Verbalizes Understanding   Class Start Time 1406   Class Stop Time 1500   Class Time Calculation (min) 54 min            Core Videos: Exercise    Move It!  Clinical staff conducted group or individual video education with verbal and written material and guidebook.  Patient learns the recommended Pritikin exercise program. Exercise with the goal of living a long, healthy  life. Some of the health benefits of exercise include controlled diabetes, healthier blood pressure  levels, improved cholesterol levels, improved heart and lung capacity, improved sleep, and better body composition. Everyone should speak with their doctor before starting or changing an exercise routine.  Biomechanical Limitations Clinical staff conducted group or individual video education with verbal and written material and guidebook.  Patient learns how biomechanical limitations can impact exercise and how we can mitigate and possibly overcome limitations to have an impactful and balanced exercise routine.  Body Composition Clinical staff conducted group or individual video education with verbal and written material and guidebook.  Patient learns that body composition (ratio of muscle mass to fat mass) is a key component to assessing overall fitness, rather than body weight alone. Increased fat mass, especially visceral belly fat, can put [place] at increased risk for metabolic syndrome, type 2 diabetes, heart disease, and even death. It is recommended to combine diet and exercise (cardiovascular and resistance training) to improve your body composition. Seek guidance from your physician and exercise physiologist before implementing an exercise routine.  Exercise Action Plan Clinical staff conducted group or individual video education with verbal and written material and guidebook.  Patient learns the recommended strategies to achieve and enjoy long-term exercise adherence, including variety, self-motivation, self-efficacy, and positive decision making. Benefits of exercise include fitness, good health, weight management, more energy, better sleep, less stress, and overall well-being.  Medical   Heart Disease Risk Reduction Clinical staff conducted group or individual video education with verbal and written material and guidebook.  Patient learns our heart is our most vital organ as it circulates  oxygen, nutrients, white blood cells, and hormones throughout the entire body, and carries waste away. Data supports a plant-based eating plan like the Pritikin Program for its effectiveness in slowing progression of and reversing heart disease. The video provides a number of recommendations to address heart disease.   Metabolic Syndrome and Belly Fat  Clinical staff conducted group or individual video education with verbal and written material and guidebook.  Patient learns what metabolic syndrome is, how it leads to heart disease, and how one can reverse it and keep it from coming back. You have metabolic syndrome if you have 3 of the following 5 criteria: abdominal obesity, high blood pressure, high triglycerides, low HDL cholesterol, and high blood sugar.  Hypertension and Heart Disease Clinical staff conducted group or individual video education with verbal and written material and guidebook.  Patient learns that high blood pressure, or hypertension, is very common in the [place]. Hypertension is largely due to excessive salt intake, but other important risk factors include being overweight, physical inactivity, drinking too much alcohol, smoking, and not eating enough potassium from fruits and vegetables. High blood pressure is a leading risk factor for heart attack, stroke, congestive heart failure, dementia, kidney failure, and premature death. Long-term effects of excessive salt intake include stiffening of the arteries and thickening of heart muscle and organ damage. Recommendations include ways to reduce hypertension and the risk of heart disease.  Diseases of Our Time - Focusing on Diabetes Clinical staff conducted group or individual video education with verbal and written material and guidebook.  Patient learns why the best way to stop diseases of our time is prevention, through food and other lifestyle changes. Medicine (such as prescription pills and surgeries) is often only a  Band-Aid on the problem, not a long-term solution. Most common diseases of our time include obesity, type 2 diabetes, hypertension, heart disease, and cancer. The Pritikin Program is recommended and has been proven to help reduce,  reverse, and/or prevent the damaging effects of metabolic syndrome.  Nutrition   Overview of the Pritikin Eating Plan  Clinical staff conducted group or individual video education with verbal and written material and guidebook.  Patient learns about the Pritikin Eating Plan for disease risk reduction. The Pritikin Eating Plan emphasizes a wide variety of unrefined, minimally-processed carbohydrates, like fruits, vegetables, whole grains, and legumes. Go, Caution, and Stop food choices are explained. Plant-based and lean animal proteins are emphasized. Rationale provided for low sodium intake for blood pressure control, low added sugars for blood sugar stabilization, and low added fats and oils for coronary artery disease risk reduction and weight management.  Calorie Density  Clinical staff conducted group or individual video education with verbal and written material and guidebook.  Patient learns about calorie density and how it impacts the Pritikin Eating Plan. Knowing the characteristics of the food you choose will help you decide whether those foods will lead to weight gain or weight loss, and whether you want to consume more or less of them. Weight loss is usually a side effect of the Pritikin Eating Plan because of its focus on low calorie-dense foods.  Label Reading  Clinical staff conducted group or individual video education with verbal and written material and guidebook.  Patient learns about the Pritikin recommended label reading guidelines and corresponding recommendations regarding calorie density, added sugars, sodium content, and whole grains.  Dining Out - Part 1  Clinical staff conducted group or individual video education with verbal and written material  and guidebook.  Patient learns that restaurant meals can be sabotaging because they can be so high in calories, fat, sodium, and/or sugar. Patient learns recommended strategies on how to positively address this and avoid unhealthy pitfalls.  Facts on Fats  Clinical staff conducted group or individual video education with verbal and written material and guidebook.  Patient learns that lifestyle modifications can be just as effective, if not more so, as many medications for lowering your risk of heart disease. A Pritikin lifestyle can help to reduce your risk of inflammation and atherosclerosis (cholesterol build-up, or plaque, in the artery walls). Lifestyle interventions such as dietary choices and physical activity address the cause of atherosclerosis. A review of the types of fats and their impact on blood cholesterol levels, along with dietary recommendations to reduce fat intake is also included.  Nutrition Action Plan  Clinical staff conducted group or individual video education with verbal and written material and guidebook.  Patient learns how to incorporate Pritikin recommendations into their lifestyle. Recommendations include planning and keeping personal health goals in mind as an important part of their success.  Healthy Mind-Set    Healthy Minds, Bodies, Hearts  Clinical staff conducted group or individual video education with verbal and written material and guidebook.  Patient learns how to identify when they are stressed. Video will discuss the impact of that stress, as well as the many benefits of stress management. Patient will also be introduced to stress management techniques. The way we think, act, and feel has an impact on our hearts.  How Our Thoughts Can Heal Our Hearts  Clinical staff conducted group or individual video education with verbal and written material and guidebook.  Patient learns that negative thoughts can cause depression and anxiety. This can result in negative  lifestyle behavior and serious health problems. Cognitive behavioral therapy is an effective method to help control our thoughts in order to change and improve our emotional outlook.  Additional Videos:  Exercise    Improving Performance  Clinical staff conducted group or individual video education with verbal and written material and guidebook.  Patient learns to use a non-linear approach by alternating intensity levels and lengths of time spent exercising to help burn more calories and lose more body fat. Cardiovascular exercise helps improve heart health, metabolism, hormonal balance, blood sugar control, and recovery from fatigue. Resistance training improves strength, endurance, balance, coordination, reaction time, metabolism, and muscle mass. Flexibility exercise improves circulation, posture, and balance. Seek guidance from your physician and exercise physiologist before implementing an exercise routine and learn your capabilities and proper form for all exercise.  Introduction to Yoga  Clinical staff conducted group or individual video education with verbal and written material and guidebook.  Patient learns about yoga, a discipline of the coming together of mind, breath, and body. The benefits of yoga include improved flexibility, improved range of motion, better posture and core strength, increased lung function, weight loss, and positive self-image. Yoga's heart health benefits include lowered blood pressure, healthier heart rate, decreased cholesterol and triglyceride levels, improved immune function, and reduced stress. Seek guidance from your physician and exercise physiologist before implementing an exercise routine and learn your capabilities and proper form for all exercise.  Medical   Aging: Enhancing Your Quality of Life  Clinical staff conducted group or individual video education with verbal and written material and guidebook.  Patient learns key strategies and recommendations  to stay in good physical health and enhance quality of life, such as prevention strategies, having an advocate, securing a Health Care Proxy and Power of Attorney, and keeping a list of medications and system for tracking them. It also discusses how to avoid risk for bone loss.  Biology of Weight Control  Clinical staff conducted group or individual video education with verbal and written material and guidebook.  Patient learns that weight gain occurs because we consume more calories than we burn (eating more, moving less). Even if your body weight is normal, you may have higher ratios of fat compared to muscle mass. Too much body fat puts you at increased risk for cardiovascular disease, heart attack, stroke, type 2 diabetes, and obesity-related cancers. In addition to exercise, following the Pritikin Eating Plan can help reduce your risk.  Decoding Lab Results  Clinical staff conducted group or individual video education with verbal and written material and guidebook.  Patient learns that lab test reflects one measurement whose values change over time and are influenced by many factors, including medication, stress, sleep, exercise, food, hydration, pre-existing medical conditions, and more. It is recommended to use the knowledge from this video to become more involved with your lab results and evaluate your numbers to speak with your doctor.   Diseases of Our Time - Overview  Clinical staff conducted group or individual video education with verbal and written material and guidebook.  Patient learns that according to the CDC, 50% to 70% of chronic diseases (such as obesity, type 2 diabetes, elevated lipids, hypertension, and heart disease) are avoidable through lifestyle improvements including healthier food choices, listening to satiety cues, and increased physical activity.  Sleep Disorders Clinical staff conducted group or individual video education with verbal and written material and  guidebook.  Patient learns how good quality and duration of sleep are important to overall health and well-being. Patient also learns about sleep disorders and how they impact health along with recommendations to address them, including discussing with a physician.  Nutrition  Dining Out - Part  2 Clinical staff conducted group or individual video education with verbal and written material and guidebook.  Patient learns how to plan ahead and communicate in order to maximize their dining experience in a healthy and nutritious manner. Included are recommended food choices based on the type of restaurant the patient is visiting.   Fueling a Banker conducted group or individual video education with verbal and written material and guidebook.  There is a strong connection between our food choices and our health. Diseases like obesity and type 2 diabetes are very prevalent and are in large-part due to lifestyle choices. The Pritikin Eating Plan provides plenty of food and hunger-curbing satisfaction. It is easy to follow, affordable, and helps reduce health risks.  Menu Workshop  Clinical staff conducted group or individual video education with verbal and written material and guidebook.  Patient learns that restaurant meals can sabotage health goals because they are often packed with calories, fat, sodium, and sugar. Recommendations include strategies to plan ahead and to communicate with the manager, chef, or server to help order a healthier meal.  Planning Your Eating Strategy  Clinical staff conducted group or individual video education with verbal and written material and guidebook.  Patient learns about the Pritikin Eating Plan and its benefit of reducing the risk of disease. The Pritikin Eating Plan does not focus on calories. Instead, it emphasizes high-quality, nutrient-rich foods. By knowing the characteristics of the foods, we choose, we can determine their calorie density  and make informed decisions.  Targeting Your Nutrition Priorities  Clinical staff conducted group or individual video education with verbal and written material and guidebook.  Patient learns that lifestyle habits have a tremendous impact on disease risk and progression. This video provides eating and physical activity recommendations based on your personal health goals, such as reducing LDL cholesterol, losing weight, preventing or controlling type 2 diabetes, and reducing high blood pressure.  Vitamins and Minerals  Clinical staff conducted group or individual video education with verbal and written material and guidebook.  Patient learns different ways to obtain key vitamins and minerals, including through a recommended healthy diet. It is important to discuss all supplements you take with your doctor.   Healthy Mind-Set    Smoking Cessation  Clinical staff conducted group or individual video education with verbal and written material and guidebook.  Patient learns that cigarette smoking and tobacco addiction pose a serious health risk which affects millions of people. Stopping smoking will significantly reduce the risk of heart disease, lung disease, and many forms of cancer. Recommended strategies for quitting are covered, including working with your doctor to develop a successful plan.  Culinary   Becoming a Set designer conducted group or individual video education with verbal and written material and guidebook.  Patient learns that cooking at home can be healthy, cost-effective, quick, and puts them in control. Keys to cooking healthy recipes will include looking at your recipe, assessing your equipment needs, planning ahead, making it simple, choosing cost-effective seasonal ingredients, and limiting the use of added fats, salts, and sugars.  Cooking - Breakfast and Snacks  Clinical staff conducted group or individual video education with verbal and written material  and guidebook.  Patient learns how important breakfast is to satiety and nutrition through the entire day. Recommendations include key foods to eat during breakfast to help stabilize blood sugar levels and to prevent overeating at meals later in the day. Planning ahead is also a key component.  Cooking - Educational psychologist conducted group or individual video education with verbal and written material and guidebook.  Patient learns eating strategies to improve overall health, including an approach to cook more at home. Recommendations include thinking of animal protein as a side on your plate rather than center stage and focusing instead on lower calorie dense options like vegetables, fruits, whole grains, and plant-based proteins, such as beans. Making sauces in large quantities to freeze for later and leaving the skin on your vegetables are also recommended to maximize your experience.  Cooking - Healthy Salads and Dressing Clinical staff conducted group or individual video education with verbal and written material and guidebook.  Patient learns that vegetables, fruits, whole grains, and legumes are the foundations of the Pritikin Eating Plan. Recommendations include how to incorporate each of these in flavorful and healthy salads, and how to create homemade salad dressings. Proper handling of ingredients is also covered. Cooking - Soups and [employer] - Soups and Desserts Clinical staff conducted group or individual video education with verbal and written material and guidebook.  Patient learns that Pritikin soups and desserts make for easy, nutritious, and delicious snacks and meal components that are low in sodium, fat, sugar, and calorie density, while high in vitamins, minerals, and filling fiber. Recommendations include simple and healthy ideas for soups and desserts.   Overview     The Pritikin Solution Program Overview Clinical staff conducted group or individual video  education with verbal and written material and guidebook.  Patient learns that the results of the Pritikin Program have been documented in more than 100 articles published in peer-reviewed journals, and the benefits include reducing risk factors for (and, in some cases, even reversing) high cholesterol, high blood pressure, type 2 diabetes, obesity, and more! An overview of the three key pillars of the Pritikin Program will be covered: eating well, doing regular exercise, and having a healthy mind-set.  WORKSHOPS  Exercise: Exercise Basics: Building Your Action Plan Clinical staff led group instruction and group discussion with PowerPoint presentation and patient guidebook. To enhance the learning environment the use of posters, models and videos may be added. At the conclusion of this workshop, patients will comprehend the difference between physical activity and exercise, as well as the benefits of incorporating both, into their routine. Patients will understand the FITT (Frequency, Intensity, Time, and Type) principle and how to use it to build an exercise action plan. In addition, safety concerns and other considerations for exercise and cardiac rehab will be addressed by the presenter. The purpose of this lesson is to promote a comprehensive and effective weekly exercise routine in order to improve patients' overall level of fitness.   Managing Heart Disease: Your Path to a Healthier Heart Clinical staff led group instruction and group discussion with PowerPoint presentation and patient guidebook. To enhance the learning environment the use of posters, models and videos may be added.At the conclusion of this workshop, patients will understand the anatomy and physiology of the heart. Additionally, they will understand how Pritikin's three pillars impact the risk factors, the progression, and the management of heart disease.  The purpose of this lesson is to provide a high-level overview of the  heart, heart disease, and how the Pritikin lifestyle positively impacts risk factors.  Exercise Biomechanics Clinical staff led group instruction and group discussion with PowerPoint presentation and patient guidebook. To enhance the learning environment the use of posters, models and videos may be added. Patients will  learn how the structural parts of their bodies function and how these functions impact their daily activities, movement, and exercise. Patients will learn how to promote a neutral spine, learn how to manage pain, and identify ways to improve their physical movement in order to promote healthy living. The purpose of this lesson is to expose patients to common physical limitations that impact physical activity. Participants will learn practical ways to adapt and manage aches and pains, and to minimize their effect on regular exercise. Patients will learn how to maintain good posture while sitting, walking, and lifting.  Balance Training and Fall Prevention  Clinical staff led group instruction and group discussion with PowerPoint presentation and patient guidebook. To enhance the learning environment the use of posters, models and videos may be added. At the conclusion of this workshop, patients will understand the importance of their sensorimotor skills (vision, proprioception, and the vestibular system) in maintaining their ability to balance as they age. Patients will apply a variety of balancing exercises that are appropriate for their current level of function. Patients will understand the common causes for poor balance, possible solutions to these problems, and ways to modify their physical environment in order to minimize their fall risk. The purpose of this lesson is to teach patients about the importance of maintaining balance as they age and ways to minimize their risk of falling.  WORKSHOPS   Nutrition:  Fueling a Ship broker led group instruction and  group discussion with PowerPoint presentation and patient guidebook. To enhance the learning environment the use of posters, models and videos may be added. Patients will review the foundational principles of the Pritikin Eating Plan and understand what constitutes a serving size in each of the food groups. Patients will also learn Pritikin-friendly foods that are better choices when away from home and review make-ahead meal and snack options. Calorie density will be reviewed and applied to three nutrition priorities: weight maintenance, weight loss, and weight gain. The purpose of this lesson is to reinforce (in a group setting) the key concepts around what patients are recommended to eat and how to apply these guidelines when away from home by planning and selecting Pritikin-friendly options. Patients will understand how calorie density may be adjusted for different weight management goals.  Mindful Eating  Clinical staff led group instruction and group discussion with PowerPoint presentation and patient guidebook. To enhance the learning environment the use of posters, models and videos may be added. Patients will briefly review the concepts of the Pritikin Eating Plan and the importance of low-calorie dense foods. The concept of mindful eating will be introduced as well as the importance of paying attention to internal hunger signals. Triggers for non-hunger eating and techniques for dealing with triggers will be explored. The purpose of this lesson is to provide patients with the opportunity to review the basic principles of the Pritikin Eating Plan, discuss the value of eating mindfully and how to measure internal cues of hunger and fullness using the Hunger Scale. Patients will also discuss reasons for non-hunger eating and learn strategies to use for controlling emotional eating.  Targeting Your Nutrition Priorities Clinical staff led group instruction and group discussion with PowerPoint presentation  and patient guidebook. To enhance the learning environment the use of posters, models and videos may be added. Patients will learn how to determine their genetic susceptibility to disease by reviewing their family history. Patients will gain insight into the importance of diet as part of an overall healthy lifestyle  in mitigating the impact of genetics and other environmental insults. The purpose of this lesson is to provide patients with the opportunity to assess their personal nutrition priorities by looking at their family history, their own health history and current risk factors. Patients will also be able to discuss ways of prioritizing and modifying the Pritikin Eating Plan for their highest risk areas  Menu  Clinical staff led group instruction and group discussion with PowerPoint presentation and patient guidebook. To enhance the learning environment the use of posters, models and videos may be added. Using menus brought in from [employer], or printed from [employer], patients will apply the Pritikin dining out guidelines that were presented in the [employer] video. Patients will also be able to practice these guidelines in a variety of provided scenarios. The purpose of this lesson is to provide patients with the opportunity to practice hands-on learning of the Pritikin Dining Out guidelines with actual menus and practice scenarios.  Label Reading Clinical staff led group instruction and group discussion with PowerPoint presentation and patient guidebook. To enhance the learning environment the use of posters, models and videos may be added. Patients will review and discuss the Pritikin label reading guidelines presented in [employer]'s Label Reading Educational series video. Using fool labels brought in from local grocery stores and markets, patients will apply the label reading guidelines and determine if the packaged food meet the Pritikin guidelines. The purpose of  this lesson is to provide patients with the opportunity to review, discuss, and practice hands-on learning of the Pritikin Label Reading guidelines with actual packaged food labels. Cooking School  Pritikin's [employer] are designed to teach patients ways to prepare quick, simple, and affordable recipes at home. The importance of nutrition's role in chronic disease risk reduction is reflected in its emphasis in the overall Pritikin program. By learning how to prepare essential core Pritikin Eating Plan recipes, patients will increase control over what they eat; be able to customize the flavor of foods without the use of added salt, sugar, or fat; and improve the quality of the food they consume. By learning a set of core recipes which are easily assembled, quickly prepared, and affordable, patients are more likely to prepare more healthy foods at home. These workshops focus on convenient breakfasts, simple entres, side dishes, and desserts which can be prepared with minimal effort and are consistent with nutrition recommendations for cardiovascular risk reduction. Cooking [employer] are taught by a Armed forces logistics/support/administrative officer (RD) who has been trained by the [employer]. The chef or RD has a clear understanding of the importance of minimizing - if not completely eliminating - added fat, sugar, and sodium in recipes. Throughout the series of Cooking School Workshop sessions, patients will learn about healthy ingredients and efficient methods of cooking to build confidence in their capability to prepare    Cooking School weekly topics:  Adding Flavor- Sodium-Free  Fast and Healthy Breakfasts  Powerhouse Plant-Based Proteins  Satisfying Salads and Dressings  Simple Sides and Sauces  International Cuisine-Spotlight on the [employer] Zones  Delicious Desserts  Savory Soups  [employer] - Meals in a Astronomer Appetizers and Snacks  Comforting Weekend  Breakfasts  One-Pot Wonders   Fast Evening Meals  Landscape architect Your Pritikin Plate  WORKSHOPS   Healthy Mindset (Psychosocial):  Focused Goals, Sustainable Changes Clinical staff led group instruction and group discussion with PowerPoint presentation and patient guidebook. To enhance the  learning environment the use of posters, models and videos may be added. Patients will be able to apply effective goal setting strategies to establish at least one personal goal, and then take consistent, meaningful action toward that goal. They will learn to identify common barriers to achieving personal goals and develop strategies to overcome them. Patients will also gain an understanding of how our mind-set can impact our ability to achieve goals and the importance of cultivating a positive and growth-oriented mind-set. The purpose of this lesson is to provide patients with a deeper understanding of how to set and achieve personal goals, as well as the tools and strategies needed to overcome common obstacles which may arise along the way.  From Head to Heart: The Power of a Healthy Outlook  Clinical staff led group instruction and group discussion with PowerPoint presentation and patient guidebook. To enhance the learning environment the use of posters, models and videos may be added. Patients will be able to recognize and describe the impact of emotions and mood on physical health. They will discover the importance of self-care and explore self-care practices which may work for them. Patients will also learn how to utilize the 4 C's to cultivate a healthier outlook and better manage stress and challenges. The purpose of this lesson is to demonstrate to patients how a healthy outlook is an essential part of maintaining good health, especially as they continue their cardiac rehab journey.  Healthy Sleep for a Healthy Heart Clinical staff led group instruction and group discussion with  PowerPoint presentation and patient guidebook. To enhance the learning environment the use of posters, models and videos may be added. At the conclusion of this workshop, patients will be able to demonstrate knowledge of the importance of sleep to overall health, well-being, and quality of life. They will understand the symptoms of, and treatments for, common sleep disorders. Patients will also be able to identify daytime and nighttime behaviors which impact sleep, and they will be able to apply these tools to help manage sleep-related challenges. The purpose of this lesson is to provide patients with a general overview of sleep and outline the importance of quality sleep. Patients will learn about a few of the most common sleep disorders. Patients will also be introduced to the concept of "sleep hygiene," and discover ways to self-manage certain sleeping problems through simple daily behavior changes. Finally, the workshop will motivate patients by clarifying the links between quality sleep and their goals of heart-healthy living.   Recognizing and Reducing Stress Clinical staff led group instruction and group discussion with PowerPoint presentation and patient guidebook. To enhance the learning environment the use of posters, models and videos may be added. At the conclusion of this workshop, patients will be able to understand the types of stress reactions, differentiate between acute and chronic stress, and recognize the impact that chronic stress has on their health. They will also be able to apply different coping mechanisms, such as reframing negative self-talk. Patients will have the opportunity to practice a variety of stress management techniques, such as deep abdominal breathing, progressive muscle relaxation, and/or guided imagery.  The purpose of this lesson is to educate patients on the role of stress in their lives and to provide healthy techniques for coping with it.  Learning  Barriers/Preferences:  Learning Barriers/Preferences - 09/08/23 1639       Learning Barriers/Preferences   Learning Barriers Hearing   hearing aids   Learning Preferences Group Instruction;Individual Instruction;Pictoral;Skilled Demonstration;Verbal Instruction;Written Material;Video  Education Topics:  Knowledge Questionnaire Score:  Knowledge Questionnaire Score - 09/08/23 1638       Knowledge Questionnaire Score   Pre Score 19/24             Core Components/Risk Factors/Patient Goals at Admission:  Personal Goals and Risk Factors at Admission - 09/08/23 1638       Core Components/Risk Factors/Patient Goals on Admission    Weight Management Yes;Weight Maintenance    Intervention Weight Management: Develop a combined nutrition and exercise program designed to reach desired caloric intake, while maintaining appropriate intake of nutrient and fiber, sodium and fats, and appropriate energy expenditure required for the weight goal.;Weight Management: Provide education and appropriate resources to help participant work on and attain dietary goals.    Expected Outcomes Short Term: Continue to assess and modify interventions until short term weight is achieved;Long Term: Adherence to nutrition and physical activity/exercise program aimed toward attainment of established weight goal;Weight Maintenance: Understanding of the daily nutrition guidelines, which includes 25-35% calories from fat, 7% or less cal from saturated fats, less than 200mg  cholesterol, less than 1.5gm of sodium, & 5 or more servings of fruits and vegetables daily;Understanding recommendations for meals to include 15-35% energy as protein, 25-35% energy from fat, 35-60% energy from carbohydrates, less than 200mg  of dietary cholesterol, 20-35 gm of total fiber daily;Understanding of distribution of calorie intake throughout the day with the consumption of 4-5 meals/snacks    Heart Failure Yes    Intervention  Provide a combined exercise and nutrition program that is supplemented with education, support and counseling about heart failure. Directed toward relieving symptoms such as shortness of breath, decreased exercise tolerance, and extremity edema.    Expected Outcomes Improve functional capacity of life;Short term: Attendance in program 2-3 days a week with increased exercise capacity. Reported lower sodium intake. Reported increased fruit and vegetable intake. Reports medication compliance.;Short term: Daily weights obtained and reported for increase. Utilizing diuretic protocols set by physician.;Long term: Adoption of self-care skills and reduction of barriers for early signs and symptoms recognition and intervention leading to self-care maintenance.    Hypertension Yes    Intervention Provide education on lifestyle modifcations including regular physical activity/exercise, weight management, moderate sodium restriction and increased consumption of fresh fruit, vegetables, and low fat dairy, alcohol moderation, and smoking cessation.;Monitor prescription use compliance.    Expected Outcomes Short Term: Continued assessment and intervention until BP is < 140/7mm HG in hypertensive participants. < 130/40mm HG in hypertensive participants with diabetes, heart failure or chronic kidney disease.;Long Term: Maintenance of blood pressure at goal levels.    Lipids Yes    Intervention Provide education and support for participant on nutrition & aerobic/resistive exercise along with prescribed medications to achieve LDL 70mg , HDL >40mg .    Expected Outcomes Short Term: Participant states understanding of desired cholesterol values and is compliant with medications prescribed. Participant is following exercise prescription and nutrition guidelines.;Long Term: Cholesterol controlled with medications as prescribed, with individualized exercise RX and with personalized nutrition plan. Value goals: LDL < 70mg , HDL > 40  mg.             Core Components/Risk Factors/Patient Goals Review:   Goals and Risk Factor Review     Row Name 09/12/23 1654 10/04/23 1554           Core Components/Risk Factors/Patient Goals Review   Personal Goals Review Weight Management/Obesity;Hypertension;Lipids;Heart Failure Weight Management/Obesity;Hypertension;Lipids;Heart Failure      Review Jan did well with exercise for her  fitness level. Vital signs were stable. Patient ueses a cane for stability Jan is off to a good start to exercise  Vital signs have been stable. Patient ueses a cane for stability      Expected Outcomes Jan will continue to participate in cardiac rehab for exercise, nutrition and lifestyle modifications. Jan will continue to participate in cardiac rehab for exercise, nutrition and lifestyle modifications.               Core Components/Risk Factors/Patient Goals at Discharge (Final Review):   Goals and Risk Factor Review - 10/04/23 1554       Core Components/Risk Factors/Patient Goals Review   Personal Goals Review Weight Management/Obesity;Hypertension;Lipids;Heart Failure    Review Jan is off to a good start to exercise  Vital signs have been stable. Patient ueses a cane for stability    Expected Outcomes Jan will continue to participate in cardiac rehab for exercise, nutrition and lifestyle modifications.             ITP Comments:  ITP Comments     Row Name 09/08/23 1625 09/12/23 1652 10/04/23 1551       ITP Comments Dr. Armanda Magic medical director. Introduction to pritikin education/ intensive cardiac rehab. Initial orientation packet reviewed with patient. 30 day ITP Review. Jan started cardiac rehab on 09/12/23. Jan did well with exercise for her fitness level. 30 day ITP Review. Jan has good attendance and participation in  cardiac rehab. Jan is enjoying the program.              Comments: See ITP comments.Thayer Headings RN BSN

## 2023-10-05 ENCOUNTER — Encounter (HOSPITAL_COMMUNITY)
Admission: RE | Admit: 2023-10-05 | Discharge: 2023-10-05 | Disposition: A | Discharge status: Home / Self Care | Payer: Medicare Other | Source: Ambulatory Visit | Attending: Cardiovascular Disease

## 2023-10-05 DIAGNOSIS — Z952 Presence of prosthetic heart valve: Secondary | ICD-10-CM | POA: Diagnosis not present

## 2023-10-05 DIAGNOSIS — R079 Chest pain, unspecified: Secondary | ICD-10-CM | POA: Diagnosis not present

## 2023-10-06 ENCOUNTER — Other Ambulatory Visit: Payer: Self-pay

## 2023-10-06 DIAGNOSIS — S75002D Unspecified injury of femoral artery, left leg, subsequent encounter: Secondary | ICD-10-CM

## 2023-10-07 ENCOUNTER — Telehealth: Payer: Self-pay | Admitting: Internal Medicine

## 2023-10-07 ENCOUNTER — Encounter (HOSPITAL_COMMUNITY)
Admission: RE | Admit: 2023-10-07 | Discharge: 2023-10-07 | Disposition: A | Discharge status: Home / Self Care | Payer: Medicare Other | Source: Ambulatory Visit | Attending: Cardiovascular Disease

## 2023-10-07 DIAGNOSIS — Z952 Presence of prosthetic heart valve: Secondary | ICD-10-CM

## 2023-10-07 DIAGNOSIS — R079 Chest pain, unspecified: Secondary | ICD-10-CM | POA: Diagnosis not present

## 2023-10-07 NOTE — Telephone Encounter (Signed)
Yes, I can accept her - schedule 11 am

## 2023-10-07 NOTE — Telephone Encounter (Signed)
Patient would like for you to accept Hillard Danker - as a new patient - please let Ms. Kanis know [phone]

## 2023-10-10 ENCOUNTER — Encounter (HOSPITAL_COMMUNITY)
Admission: RE | Admit: 2023-10-10 | Discharge: 2023-10-10 | Disposition: A | Discharge status: Home / Self Care | Payer: Medicare Other | Source: Ambulatory Visit | Attending: Cardiovascular Disease | Admitting: Cardiovascular Disease

## 2023-10-10 DIAGNOSIS — Z952 Presence of prosthetic heart valve: Secondary | ICD-10-CM

## 2023-10-10 DIAGNOSIS — R079 Chest pain, unspecified: Secondary | ICD-10-CM | POA: Diagnosis not present

## 2023-10-11 NOTE — Telephone Encounter (Signed)
Message left for  Ms. Macera to have patient contact our office to set up new patient appointment.

## 2023-10-12 ENCOUNTER — Encounter (HOSPITAL_COMMUNITY)
Admission: RE | Admit: 2023-10-12 | Discharge: 2023-10-12 | Disposition: A | Discharge status: Home / Self Care | Payer: Medicare Other | Source: Ambulatory Visit | Attending: Cardiovascular Disease | Admitting: Cardiovascular Disease

## 2023-10-12 DIAGNOSIS — R079 Chest pain, unspecified: Secondary | ICD-10-CM | POA: Diagnosis not present

## 2023-10-12 DIAGNOSIS — Z952 Presence of prosthetic heart valve: Secondary | ICD-10-CM | POA: Diagnosis not present

## 2023-10-14 ENCOUNTER — Encounter (HOSPITAL_COMMUNITY): Payer: Medicare Other

## 2023-10-17 ENCOUNTER — Encounter (HOSPITAL_COMMUNITY): Payer: Medicare Other

## 2023-10-17 ENCOUNTER — Ambulatory Visit: Payer: Medicare Other

## 2023-10-17 ENCOUNTER — Encounter (HOSPITAL_COMMUNITY)
Admission: RE | Admit: 2023-10-17 | Discharge: 2023-10-17 | Disposition: A | Discharge status: Home / Self Care | Payer: Medicare Other | Source: Ambulatory Visit | Attending: Cardiovascular Disease | Admitting: Cardiovascular Disease

## 2023-10-17 DIAGNOSIS — Z48812 Encounter for surgical aftercare following surgery on the circulatory system: Secondary | ICD-10-CM | POA: Insufficient documentation

## 2023-10-17 DIAGNOSIS — Z952 Presence of prosthetic heart valve: Secondary | ICD-10-CM | POA: Insufficient documentation

## 2023-10-18 DIAGNOSIS — R3915 Urgency of urination: Secondary | ICD-10-CM | POA: Diagnosis not present

## 2023-10-18 DIAGNOSIS — N3944 Nocturnal enuresis: Secondary | ICD-10-CM | POA: Diagnosis not present

## 2023-10-19 ENCOUNTER — Encounter (HOSPITAL_COMMUNITY)
Admission: RE | Admit: 2023-10-19 | Discharge: 2023-10-19 | Disposition: A | Discharge status: Home / Self Care | Payer: Medicare Other | Source: Ambulatory Visit | Attending: Cardiovascular Disease | Admitting: Cardiovascular Disease

## 2023-10-19 DIAGNOSIS — Z952 Presence of prosthetic heart valve: Secondary | ICD-10-CM

## 2023-10-19 DIAGNOSIS — Z48812 Encounter for surgical aftercare following surgery on the circulatory system: Secondary | ICD-10-CM | POA: Diagnosis not present

## 2023-10-20 ENCOUNTER — Telehealth: Payer: Self-pay | Admitting: Cardiovascular Disease

## 2023-10-20 NOTE — Progress Notes (Signed)
Cardiac Individual Treatment Plan  Patient Details  Name: [name] MRN: [id number] Date of Birth: 06-04-1941 Referring Provider:   Flowsheet Row INTENSIVE CARDIAC REHAB ORIENT from 09/08/2023 in [facility]  Referring Provider Tonny Bollman, MD       Initial Encounter Date:  Flowsheet Row INTENSIVE CARDIAC REHAB ORIENT from 09/08/2023 in [facility] for Heart, Vascular, & Lung Health  Date 09/08/23       Visit Diagnosis: 06/21/23 S/P TAVR (transcatheter aortic valve replacement)  Patient's Home Medications on Admission:  Current Outpatient Medications:    aspirin 81 MG tablet, Take 81 mg by mouth daily.  , Disp: , Rfl:    atorvastatin (LIPITOR) 20 MG tablet, TAKE 1 TABLET BY MOUTH EVERY DAY, Disp: 90 tablet, Rfl: 1   Calcium Carbonate (CALTRATE 600 PO), Take 1 tablet by mouth 2 (two) times daily.  , Disp: , Rfl:    citalopram (CELEXA) 20 MG tablet, TAKE 1 TABLET BY MOUTH EVERY DAY, Disp: 90 tablet, Rfl: 2   co-enzyme Q-10 30 MG capsule, Take 30 mg by mouth daily., Disp: , Rfl:    doxycycline (VIBRAMYCIN) 100 MG capsule, Take 1 capsule (100 mg total) by mouth as needed (Take two tablets by mouth one hour prior to dental appointments)., Disp: 10 capsule, Rfl: 12   famotidine (PEPCID) 40 MG tablet, TAKE 1 TABLET BY MOUTH EVERY DAY, Disp: 90 tablet, Rfl: 1   fluticasone (FLONASE) 50 MCG/ACT nasal spray, Place 1 spray into both nostrils daily as needed for allergies or rhinitis., Disp: 9.9 mL, Rfl: 5   furosemide (LASIX) 20 MG tablet, TAKE 1 TABLET BY MOUTH EVERY DAY, Disp: 90 tablet, Rfl: 0   levothyroxine (SYNTHROID) 75 MCG tablet, TAKE 1 TABLET BY MOUTH EVERY MORNING BEFORE BREAKFAST, Disp: 90 tablet, Rfl: 1   losartan (COZAAR) 50 MG tablet, TAKE 1 TABLET BY MOUTH EVERY DAY, Disp: 90 tablet, Rfl: 3   meloxicam (MOBIC) 7.5 MG tablet, TAKE 1/2 TO 1 TABLET EVERY DAY, Disp: 90 tablet, Rfl: 0    metoprolol tartrate (LOPRESSOR) 25 MG tablet, TAKE 1 TABLET BY MOUTH TWICE A DAY, Disp: 180 tablet, Rfl: 1   Multiple Vitamins-Minerals (MULTIVITAMIN) LIQD, Take 1 tablet by mouth daily., Disp: , Rfl:    Multiple Vitamins-Minerals (PRESERVISION AREDS 2) CAPS, Take 1 capsule by mouth 2 (two) times daily., Disp: , Rfl:    senna-docusate (SENOKOT-S) 8.6-50 MG per tablet, Take 1 tablet by mouth daily.  , Disp: , Rfl:    VITAMIN D, CHOLECALCIFEROL, PO, Take 1 tablet by mouth daily., Disp: , Rfl:    zolpidem (AMBIEN) 5 MG tablet, TAKE 1 TABLET (5 MG TOTAL) BY MOUTH EVERY DAY AT BEDTIME AS NEEDED FOR SLEEP, Disp: 30 tablet, Rfl: 0  Past Medical History: Past Medical History:  Diagnosis Date   Allergy    SEASONAL   Anxiety    BREAST CANCER 1991   left, 1991; B mastectomy   CAD (coronary artery disease)    s/p CABG x5 in 2007   Cataract    BILATERAL-REMOVED   Depression    DIVERTICULOSIS    GERD    History of DVT (deep vein thrombosis)    HYPERLIPIDEMIA    Hypertension    HYPOTHYROIDISM    Occlusion and stenosis of carotid artery without mention of cerebral infarction    OSTEOPOROSIS    S/P TAVR (transcatheter aortic valve replacement) 06/21/2023   s/p TAVR with a  23mm Edwards S3UR via the TF approach by Dr. Clifton James & Dr. Leafy Ro   Severe aortic stenosis     Tobacco Use: Social History   Tobacco Use  Smoking Status Never  Smokeless Tobacco Never    Labs: Review Flowsheet  More data exists      Latest Ref Rng & Units 08/04/2022 02/02/2023 06/09/2023 06/21/2023 06/22/2023  Labs for ITP Cardiac and Pulmonary Rehab  Cholestrol 0 - 200 mg/dL 782  956  - - -  LDL (calc) 0 - 99 mg/dL 61  61  - - -  HDL-C >21.30 mg/dL 86.57  84.69  - - -  Trlycerides 0.0 - 149.0 mg/dL 62.9  52.8  - - -  Hemoglobin A1c 4.6 - 6.5 % 5.0  5.9  - - -  PH, Arterial 7.35 - 7.45 - - 7.414  - 7.360   PCO2 arterial 32 - 48 mmHg - - 33.5  - 47.6   Bicarbonate 20.0 - 28.0 mmol/L - - 21.5  17.3  - 26.9   TCO2 22 -  32 mmol/L - - 22  19  28  28    Acid-base deficit 0.0 - 2.0 mmol/L - - 2.0  10.0  - -  O2 Saturation % - - 96  60  - 100     Details       Multiple values from one day are sorted in reverse-chronological order         Capillary Blood Glucose: Lab Results  Component Value Date   GLUCAP 93 06/26/2023   GLUCAP 89 09/27/2022     Exercise Target Goals: Exercise Program Goal: Individual exercise prescription set using results from initial 6 min walk test and THRR while considering  patient's activity barriers and safety.   Exercise Prescription Goal: Initial exercise prescription builds to 30-45 minutes a day of aerobic activity, 2-3 days per week.  Home exercise guidelines will be given to patient during program as part of exercise prescription that the participant will acknowledge.  Activity Barriers & Risk Stratification:  Activity Barriers & Cardiac Risk Stratification - 09/08/23 1628       Activity Barriers & Cardiac Risk Stratification   Activity Barriers Joint Problems;Deconditioning;Chest Pain/Angina;Balance Concerns;Decreased Ventricular Function;Muscular Weakness;Assistive Device    Cardiac Risk Stratification High   <5 METs on            6 Minute Walk:  6 Minute Walk     Row Name 09/08/23 1633         6 Minute Walk   Phase Initial     Distance 720 feet     Walk Time 6 minutes     # of Rest Breaks 0     MPH 1.36     METS 1.22     RPE 9     Perceived Dyspnea  2     VO2 Peak 4.27     Symptoms Yes (comment)     Comments acid reflux during walk, turned into CP and SOB- RN Carlette consulted. See notes for details.     Resting HR 56 bpm     Resting BP 114/62     Resting Oxygen Saturation  95 %     Exercise Oxygen Saturation  during 6 min walk 100 %     Max Ex. HR 71 bpm     Max Ex. BP 158/62     2 Minute Post BP 110/62  Oxygen Initial Assessment:   Oxygen Re-Evaluation:   Oxygen Discharge (Final Oxygen  Re-Evaluation):   Initial Exercise Prescription:  Initial Exercise Prescription - 09/08/23 1600       Date of Initial Exercise RX and Referring Provider   Date 09/08/23    Referring Provider Tonny Bollman, MD    Expected Discharge Date 11/29/22      NuStep   Level 1    SPM 55    Minutes 20    METs 1.8      Prescription Details   Frequency (times per week) 3    Duration Progress to 30 minutes of continuous aerobic without signs/symptoms of physical distress      Intensity   THRR 40-80% of Max Heartrate 54-109    Ratings of Perceived Exertion 11-13    Perceived Dyspnea 0-4      Progression   Progression Continue progressive overload as per policy without signs/symptoms or physical distress.      Resistance Training   Training Prescription Yes    Weight 1    Reps 10-15             Perform Capillary Blood Glucose checks as needed.  Exercise Prescription Changes:   Exercise Prescription Changes     Row Name 09/12/23 1636 09/26/23 1656 10/21/23 1700         Response to Exercise   Blood Pressure (Admit) 116/60 120/66 132/60     Blood Pressure (Exercise) 158/72 138/56 138/80     Blood Pressure (Exit) 132/70 120/70 142/52     Heart Rate (Admit) 69 bpm 73 bpm 69 bpm     Heart Rate (Exercise) 69 bpm 71 bpm 74 bpm     Heart Rate (Exit) 61 bpm 60 bpm 63 bpm     Rating of Perceived Exertion (Exercise) 7 15 11      Perceived Dyspnea (Exercise) 0 0 0     Symptoms none none none     Comments Pt first day in the [employer] program Reviewed MET's, goals and home ExRx Reviewed METs     Duration Progress to 30 minutes of  aerobic without signs/symptoms of physical distress Progress to 30 minutes of  aerobic without signs/symptoms of physical distress Progress to 30 minutes of  aerobic without signs/symptoms of physical distress     Intensity THRR unchanged THRR unchanged THRR unchanged       Progression   Progression Continue to progress workloads to maintain intensity  without signs/symptoms of physical distress. Continue to progress workloads to maintain intensity without signs/symptoms of physical distress. Continue to progress workloads to maintain intensity without signs/symptoms of physical distress.     Average METs 1.3 1.6 1.8       Resistance Training   Training Prescription Yes Yes Yes     Weight 1 1 2      Reps 10-15 10-15 10-15     Time 10 Minutes 10 Minutes 10 Minutes       NuStep   Level 1 1 3      SPM 67 90 84     Minutes 20 20 30      METs 1.3 1.6 1.8              Exercise Comments:   Exercise Comments     Row Name 09/12/23 1643 09/26/23 1701 10/21/23 1709       Exercise Comments Pt first day in the [facility] program. Pt tolerated low intensity exercise well with an average MET level of  1.3. Pt is learning her THRR, RPE and ExRx Pt first day in the [facility] program. Pt tolerated low intensity exercise well with an average MET level of 1.6. Pt is progressing well, she is increasing her MET's and is now able to achieve 30 mins of aerobic exercise. Talked about increasing depth of step, SPM and then soon increase resistance when ready. Right now exercise and ADL's are enough for safe exercise, will continue to work on balance before revieweing home ExRx Reviewed METs with pt. Pt is showing steady progress with an avg MET level of 1.8, tolerating well without s/sx. Discussed with pt using deeper strides while on nustep to achieve higher MET level. Pt is progressing well, she can now tolerate lvl 3 for 30 min, and has also increased to 2lb wts for resistance training.              Exercise Goals and Review:   Exercise Goals     Row Name 09/08/23 1626             Exercise Goals   Increase Physical Activity Yes       Intervention Provide advice, education, support and counseling about physical activity/exercise needs.;Develop an individualized exercise prescription for aerobic and resistive training based on initial  evaluation findings, risk stratification, comorbidities and participant's personal goals.       Expected Outcomes Short Term: Attend rehab on a regular basis to increase amount of physical activity.;Long Term: Exercising regularly at least 3-5 days a week.;Long Term: Add in home exercise to make exercise part of routine and to increase amount of physical activity.       Increase Strength and Stamina Yes       Intervention Provide advice, education, support and counseling about physical activity/exercise needs.;Develop an individualized exercise prescription for aerobic and resistive training based on initial evaluation findings, risk stratification, comorbidities and participant's personal goals.       Expected Outcomes Short Term: Increase workloads from initial exercise prescription for resistance, speed, and METs.;Short Term: Perform resistance training exercises routinely during rehab and add in resistance training at home;Long Term: Improve cardiorespiratory fitness, muscular endurance and strength as measured by increased METs and functional capacity ( )       Able to understand and use rate of perceived exertion (RPE) scale Yes       Intervention Provide education and explanation on how to use RPE scale       Expected Outcomes Short Term: Able to use RPE daily in rehab to express subjective intensity level;Long Term:  Able to use RPE to guide intensity level when exercising independently       Knowledge and understanding of Target Heart Rate Range (THRR) Yes       Intervention Provide education and explanation of THRR including how the numbers were predicted and where they are located for reference       Expected Outcomes Short Term: Able to state/look up THRR;Short Term: Able to use daily as guideline for intensity in rehab;Long Term: Able to use THRR to govern intensity when exercising independently       Understanding of Exercise Prescription Yes       Intervention Provide education,  explanation, and written materials on patient's individual exercise prescription       Expected Outcomes Short Term: Able to explain program exercise prescription;Long Term: Able to explain home exercise prescription to exercise independently  Exercise Goals Re-Evaluation :  Exercise Goals Re-Evaluation     Row Name 09/12/23 1640 09/26/23 1658           Exercise Goal Re-Evaluation   Exercise Goals Review Increase Physical Activity;Understanding of Exercise Prescription;Increase Strength and Stamina;Knowledge and understanding of Target Heart Rate Range (THRR);Able to understand and use rate of perceived exertion (RPE) scale Increase Physical Activity;Understanding of Exercise Prescription;Increase Strength and Stamina;Knowledge and understanding of Target Heart Rate Range (THRR);Able to understand and use rate of perceived exertion (RPE) scale      Comments Pt first day in the [facility] program. Pt tolerated low intensity exercise well with an average MET level of 1.3. Pt is learning her THRR, RPE and ExRx Pt first day in the [facility] program. Pt tolerated low intensity exercise well with an average MET level of 1.6. Pt is progressing well, she is increasing her MET's and is now able to achieve 30 mins of aerobic exercise. Talked about increasing depth of step, SPM and then soon increase resistance when ready. Right now exercise and ADL's are enough for safe exercise, will continue to work on balance before revieweing home ExRx      Expected Outcomes Will continue to monitor pt and progress workloads as tolerated without sign or symptom Will continue to monitor pt and progress workloads as tolerated without sign or symptom               Discharge Exercise Prescription (Final Exercise Prescription Changes):  Exercise Prescription Changes - 10/21/23 1700       Response to Exercise   Blood Pressure (Admit) 132/60    Blood Pressure (Exercise) 138/80    Blood  Pressure (Exit) 142/52    Heart Rate (Admit) 69 bpm    Heart Rate (Exercise) 74 bpm    Heart Rate (Exit) 63 bpm    Rating of Perceived Exertion (Exercise) 11    Perceived Dyspnea (Exercise) 0    Symptoms none    Comments Reviewed METs    Duration Progress to 30 minutes of  aerobic without signs/symptoms of physical distress    Intensity THRR unchanged      Progression   Progression Continue to progress workloads to maintain intensity without signs/symptoms of physical distress.    Average METs 1.8      Resistance Training   Training Prescription Yes    Weight 2    Reps 10-15    Time 10 Minutes      NuStep   Level 3    SPM 84    Minutes 30    METs 1.8             Nutrition:  Target Goals: Understanding of nutrition guidelines, daily intake of sodium 1500mg , cholesterol 200mg , calories 30% from fat and 7% or less from saturated fats, daily to have 5 or more servings of fruits and vegetables.  Biometrics:  Pre Biometrics - 09/08/23 1636       Pre Biometrics   Height 5\' 4"  (1.626 m)    Weight 58.1 kg    Waist Circumference 32.5 inches    Hip Circumference 38 inches    Waist to Hip Ratio 0.86 %    BMI (Calculated) 21.98    Triceps Skinfold 20 mm    % Body Fat 34.6 %    Grip Strength 18 kg    Flexibility 0 in   not done   Single Leg Stand 0 seconds   not done  Nutrition Therapy Plan and Nutrition Goals:  Nutrition Therapy & Goals - 09/12/23 1551       Nutrition Therapy   Diet Heart Healthy Diet    Drug/Food Interactions Statins/Certain Fruits      Personal Nutrition Goals   Nutrition Goal Patient to identify strategies for reducing cardiovascular risk by attending the [facility] education and nutrition series weekly.    Personal Goal #2 Patient to improve diet quality by using the plate method as a guide for meal planning to include lean protein/plant protein, fruits, vegetables, whole grains, nonfat dairy as part of a well-balanced diet.     Comments Toby reports no nutrition concerns at this time. She is not interested in making significant nutrition changes related to advanced age; she enjoys sugar and does eat out occasionally. She has medical history of s/p TAVR, CABG x5 (in 2007), HTN, hyperlipidemia. Her LDL remains well controlled <70. Patient will benefit from participation in intensive cardiac rehab for nutrition, exercise, and lifestyle modification.      Intervention Plan   Intervention Prescribe, educate and counsel regarding individualized specific dietary modifications aiming towards targeted core components such as weight, hypertension, lipid management, diabetes, heart failure and other comorbidities.;Nutrition handout(s) given to patient.    Expected Outcomes Short Term Goal: Understand basic principles of dietary content, such as calories, fat, sodium, cholesterol and nutrients.;Long Term Goal: Adherence to prescribed nutrition plan.             Nutrition Assessments:  Nutrition Assessments - 09/15/23 0841       Rate Your Plate Scores   Pre Score 49            MEDIFICTS Score Key: >=70 Need to make dietary changes  40-70 Heart Healthy Diet <= 40 Therapeutic Level Cholesterol Diet   Flowsheet Row INTENSIVE CARDIAC REHAB from 09/14/2023 in [facility], Vascular, & [facility]  Picture Your Plate Total Score on Admission 49      Picture Your Plate Scores: <16 Unhealthy dietary pattern with much room for improvement. 41-50 Dietary pattern unlikely to meet recommendations for good health and room for improvement. 51-60 More healthful dietary pattern, with some room for improvement.  >60 Healthy dietary pattern, although there may be some specific behaviors that could be improved.    Nutrition Goals Re-Evaluation:  Nutrition Goals Re-Evaluation     Row Name 09/12/23 1551             Goals   Current Weight 128 lb 1.4 oz (58.1 kg)       Comment lipids WNL,  LDL 61, A1c 5.9       Expected Outcome Elenna reports no nutrition concerns at this time. She is not interested in making significant nutrition changes related to advanced age; she enjoys sugar and does eat out occasionally. She has medical history of s/p TAVR, CABG x5 (in 2007), HTN, hyperlipidemia. Her LDL remains well controlled <70. Patient will benefit from participation in intensive cardiac rehab for nutrition, exercise, and lifestyle modification.                Nutrition Goals Re-Evaluation:  Nutrition Goals Re-Evaluation     Row Name 09/12/23 1551             Goals   Current Weight 128 lb 1.4 oz (58.1 kg)       Comment lipids WNL, LDL 61, A1c 5.9       Expected Outcome Mesa reports no nutrition concerns at  this time. She is not interested in making significant nutrition changes related to advanced age; she enjoys sugar and does eat out occasionally. She has medical history of s/p TAVR, CABG x5 (in 2007), HTN, hyperlipidemia. Her LDL remains well controlled <70. Patient will benefit from participation in intensive cardiac rehab for nutrition, exercise, and lifestyle modification.                Nutrition Goals Discharge (Final Nutrition Goals Re-Evaluation):  Nutrition Goals Re-Evaluation - 09/12/23 1551       Goals   Current Weight 128 lb 1.4 oz (58.1 kg)    Comment lipids WNL, LDL 61, A1c 5.9    Expected Outcome Gohar reports no nutrition concerns at this time. She is not interested in making significant nutrition changes related to advanced age; she enjoys sugar and does eat out occasionally. She has medical history of s/p TAVR, CABG x5 (in 2007), HTN, hyperlipidemia. Her LDL remains well controlled <70. Patient will benefit from participation in intensive cardiac rehab for nutrition, exercise, and lifestyle modification.             Psychosocial: Target Goals: Acknowledge presence or absence of significant depression and/or stress, maximize coping skills,  provide positive support system. Participant is able to verbalize types and ability to use techniques and skills needed for reducing stress and depression.  Initial Review & Psychosocial Screening:  Initial Psych Review & Screening - 09/08/23 1638       Initial Review   Current issues with None Identified      Family Dynamics   Good Support System? Yes   Patient is feeling good and is well supported by her friends, church family and family     Barriers   Psychosocial barriers to participate in program There are no identifiable barriers or psychosocial needs.      Screening Interventions   Interventions Encouraged to exercise;Provide feedback about the scores to participant    Expected Outcomes Long Term Goal: Stressors or current issues are controlled or eliminated.;Short Term goal: Identification and review with participant of any Quality of Life or Depression concerns found by scoring the questionnaire.;Long Term goal: The participant improves quality of Life and PHQ9 Scores as seen by post scores and/or verbalization of changes             Quality of Life Scores:  Quality of Life - 09/08/23 1637       Quality of Life   Select Quality of Life      Quality of Life Scores   Health/Function Pre 22.15 %    Socioeconomic Pre 25 %    Psych/Spiritual Pre 30 %    Family Pre 30 %    GLOBAL Pre 25.98 %            Scores of 19 and below usually indicate a poorer quality of life in these areas.  A difference of  2-3 points is a clinically meaningful difference.  A difference of 2-3 points in the total score of the Quality of Life Index has been associated with significant improvement in overall quality of life, self-image, physical symptoms, and general health in studies assessing change in quality of life.  PHQ-9: Review Flowsheet  More data exists      09/08/2023 02/02/2023 08/04/2022 08/31/2021 08/27/2020  Depression screen PHQ 2/9  Decreased Interest 0 0 0 0 0  Down,  Depressed, Hopeless 0 0 0 0 0  PHQ - 2 Score 0 0 0 0 0  Altered sleeping 0 - - - -  Tired, decreased energy 2 - - - -  Change in appetite 0 - - - -  Feeling bad or failure about yourself  0 - - - -  Trouble concentrating 0 - - - -  Moving slowly or fidgety/restless 0 - - - -  Suicidal thoughts 0 - - - -  PHQ-9 Score 2 - - - -  Difficult doing work/chores Not difficult at all - - - -    Details           Interpretation of Total Score  Total Score Depression Severity:  1-4 = Minimal depression, 5-9 = Mild depression, 10-14 = Moderate depression, 15-19 = Moderately severe depression, 20-27 = Severe depression   Psychosocial Evaluation and Intervention:   Psychosocial Re-Evaluation:  Psychosocial Re-Evaluation     Row Name 09/12/23 1653 10/04/23 1552 10/20/23 1207         Psychosocial Re-Evaluation   Current issues with None Identified Current Stress Concerns Current Stress Concerns     Comments -- Jan did have some concerns about right leg pain and swelling when she first started participating in the program. Jan has since been evaluated and has no further concerns. Jan has not voiced any increased further concerns or stressors during exercise at cardiac rehab.     Expected Outcomes -- Jan will have controlled or decreased stressors upon completion of cardiac rehab Jan will have controlled or decreased stressors upon completion of cardiac rehab     Interventions Encouraged to attend Cardiac Rehabilitation for the exercise Encouraged to attend Cardiac Rehabilitation for the exercise Encouraged to attend Cardiac Rehabilitation for the exercise     Continue Psychosocial Services  No Follow up required No Follow up required No Follow up required       Initial Review   Source of Stress Concerns -- Chronic Illness Chronic Illness     Comments -- Will continue to montior and offer support as needed. Will continue to montior and offer support as needed.              Psychosocial  Discharge (Final Psychosocial Re-Evaluation):  Psychosocial Re-Evaluation - 10/20/23 1207       Psychosocial Re-Evaluation   Current issues with Current Stress Concerns    Comments Jan has not voiced any increased further concerns or stressors during exercise at cardiac rehab.    Expected Outcomes Jan will have controlled or decreased stressors upon completion of cardiac rehab    Interventions Encouraged to attend Cardiac Rehabilitation for the exercise    Continue Psychosocial Services  No Follow up required      Initial Review   Source of Stress Concerns Chronic Illness    Comments Will continue to montior and offer support as needed.             Vocational Rehabilitation: Provide vocational rehab assistance to qualifying candidates.   Vocational Rehab Evaluation & Intervention:  Vocational Rehab - 09/08/23 1635       Initial Vocational Rehab Evaluation & Intervention   Assessment shows need for Vocational Rehabilitation No   patient is retired. No needs            Education: Education Goals: Education classes will be provided on a weekly basis, covering required topics. Participant will state understanding/return demonstration of topics presented.    Education     Row Name 09/12/23 1600     Education   Cardiac Education Topics Pritikin   Select  Workshops     Garment/textile technologist Psychosocial   Psychosocial Workshop Healthy Sleep for a Healthy Heart   Instruction Review Code 1- Teaching laboratory technician Start Time 1400   Class Stop Time 1450   Class Time Calculation (min) 50 min    Row Name 09/14/23 1600     Education   Cardiac Education Topics Pritikin   Customer service manager   Weekly Topic Simple Sides and Sauces   Instruction Review Code 1- Verbalizes Understanding   Class Start Time 1400   Class Stop Time 1440   Class Time Calculation (min) 40 min    Row Name  09/16/23 1400     Education   Cardiac Education Topics Pritikin   Nurse, children's Exercise Physiologist   Select Psychosocial   Psychosocial How Our Thoughts Can Heal Our Hearts   Instruction Review Code 1- Verbalizes Understanding   Class Start Time 1355   Class Stop Time 1428   Class Time Calculation (min) 33 min    Row Name 09/21/23 1600     Education   Cardiac Education Topics Pritikin   Customer service manager   Weekly Topic Powerhouse Plant-Based Proteins   Instruction Review Code 1- Verbalizes Understanding   Class Start Time 1400   Class Stop Time 1438   Class Time Calculation (min) 38 min    Row Name 09/23/23 1400     Education   Cardiac Education Topics Pritikin   Hospital doctor Education   General Education Hypertension and Heart Disease   Instruction Review Code 1- Verbalizes Understanding   Class Start Time 1357   Class Stop Time 1430   Class Time Calculation (min) 33 min    Row Name 09/26/23 1700     Education   Cardiac Education Topics Pritikin   Geographical information systems officer Psychosocial   Psychosocial Workshop From Head to Heart: The Power of a Healthy Outlook   Instruction Review Code 1- Verbalizes Understanding   Class Start Time 1400   Class Stop Time 1450   Class Time Calculation (min) 50 min    Row Name 09/28/23 1500     Education   Cardiac Education Topics Pritikin   Customer service manager   Weekly Topic Adding Flavor - Sodium-Free   Instruction Review Code 1- Verbalizes Understanding   Class Start Time 1400   Class Stop Time 1440   Class Time Calculation (min) 40 min    Row Name 09/30/23 1500     Education   Cardiac Education Topics Pritikin   Administrator Education   General Education Heart Disease Risk Reduction   Instruction Review Code 1- Verbalizes Understanding   Class Start Time 1400   Class Stop Time 1433   Class Time Calculation (min) 33 min    Row Name 10/03/23 1600     Education   Cardiac Education Topics Pritikin   Geographical information systems officer  Exercise   Exercise Workshop Location manager and Fall Prevention   Instruction Review Code 1- Verbalizes Understanding   Class Start Time 1406   Class Stop Time 1500   Class Time Calculation (min) 54 min    Row Name 10/05/23 1500     Education   Cardiac Education Topics Pritikin   Customer service manager   Weekly Topic Fast and Healthy Breakfasts   Instruction Review Code 1- Verbalizes Understanding   Class Start Time 1355   Class Stop Time 1435   Class Time Calculation (min) 40 min    Row Name 10/07/23 1500     Education   Cardiac Education Topics Pritikin   Licensed conveyancer Nutrition   Nutrition Overview of the Pritikin Eating Plan   Instruction Review Code 1- Verbalizes Understanding   Class Start Time 1400   Class Stop Time 1445   Class Time Calculation (min) 45 min    Row Name 10/10/23 1400     Education   Cardiac Education Topics Pritikin   Nurse, children's Exercise Physiologist   Select Psychosocial   Psychosocial Healthy Minds, Bodies, Hearts   Instruction Review Code 1- Verbalizes Understanding   Class Start Time 1357   Class Stop Time 1429   Class Time Calculation (min) 32 min    Row Name 10/12/23 1400     Education   Cardiac Education Topics Pritikin   Nurse, children's Nurse   Select Nutrition   Nutrition Becoming a Pritikin Chef   Instruction Review Code 1- Verbalizes Understanding   Class Start Time 1356    Row  Name 10/17/23 1400     Education   Cardiac Education Topics Pritikin   Select Core Videos     Core Videos   Educator Exercise Physiologist   Select Nutrition   Nutrition Other  Label Reading   Instruction Review Code 1- Verbalizes Understanding   Class Start Time 1356   Class Stop Time 1430   Class Time Calculation (min) 34 min    Row Name 10/19/23 1500     Education   Cardiac Education Topics Pritikin   Customer service manager   Weekly Topic Tasty Appetizers and Snacks   Instruction Review Code 1- Verbalizes Understanding   Class Start Time 1355   Class Stop Time 1440   Class Time Calculation (min) 45 min    Row Name 10/21/23 1500     Education   Cardiac Education Topics Pritikin   Glass blower/designer Nutrition   Nutrition Workshop Label Reading   Instruction Review Code 1- Verbalizes Understanding   Class Start Time 1401   Class Stop Time 1433   Class Time Calculation (min) 32 min            Core Videos: Exercise    Move It!  Clinical staff conducted group or individual video education with verbal and written material and guidebook.  Patient learns the recommended Pritikin exercise program. Exercise with the goal of living a long, healthy life. Some of the health benefits of exercise include controlled diabetes, healthier blood pressure levels, improved cholesterol levels, improved heart and lung capacity, improved  sleep, and better body composition. Everyone should speak with their doctor before starting or changing an exercise routine.  Biomechanical Limitations Clinical staff conducted group or individual video education with verbal and written material and guidebook.  Patient learns how biomechanical limitations can impact exercise and how we can mitigate and possibly overcome limitations to have an impactful and balanced exercise routine.  Body Composition Clinical staff  conducted group or individual video education with verbal and written material and guidebook.  Patient learns that body composition (ratio of muscle mass to fat mass) is a key component to assessing overall fitness, rather than body weight alone. Increased fat mass, especially visceral belly fat, can put [place] at increased risk for metabolic syndrome, type 2 diabetes, heart disease, and even death. It is recommended to combine diet and exercise (cardiovascular and resistance training) to improve your body composition. Seek guidance from your physician and exercise physiologist before implementing an exercise routine.  Exercise Action Plan Clinical staff conducted group or individual video education with verbal and written material and guidebook.  Patient learns the recommended strategies to achieve and enjoy long-term exercise adherence, including variety, self-motivation, self-efficacy, and positive decision making. Benefits of exercise include fitness, good health, weight management, more energy, better sleep, less stress, and overall well-being.  Medical   Heart Disease Risk Reduction Clinical staff conducted group or individual video education with verbal and written material and guidebook.  Patient learns our heart is our most vital organ as it circulates oxygen, nutrients, white blood cells, and hormones throughout the entire body, and carries waste away. Data supports a plant-based eating plan like the Pritikin Program for its effectiveness in slowing progression of and reversing heart disease. The video provides a number of recommendations to address heart disease.   Metabolic Syndrome and Belly Fat  Clinical staff conducted group or individual video education with verbal and written material and guidebook.  Patient learns what metabolic syndrome is, how it leads to heart disease, and how one can reverse it and keep it from coming back. You have metabolic syndrome if you have 3 of the following 5  criteria: abdominal obesity, high blood pressure, high triglycerides, low HDL cholesterol, and high blood sugar.  Hypertension and Heart Disease Clinical staff conducted group or individual video education with verbal and written material and guidebook.  Patient learns that high blood pressure, or hypertension, is very common in the [place]. Hypertension is largely due to excessive salt intake, but other important risk factors include being overweight, physical inactivity, drinking too much alcohol, smoking, and not eating enough potassium from fruits and vegetables. High blood pressure is a leading risk factor for heart attack, stroke, congestive heart failure, dementia, kidney failure, and premature death. Long-term effects of excessive salt intake include stiffening of the arteries and thickening of heart muscle and organ damage. Recommendations include ways to reduce hypertension and the risk of heart disease.  Diseases of Our Time - Focusing on Diabetes Clinical staff conducted group or individual video education with verbal and written material and guidebook.  Patient learns why the best way to stop diseases of our time is prevention, through food and other lifestyle changes. Medicine (such as prescription pills and surgeries) is often only a Band-Aid on the problem, not a long-term solution. Most common diseases of our time include obesity, type 2 diabetes, hypertension, heart disease, and cancer. The Pritikin Program is recommended and has been proven to help reduce, reverse, and/or prevent the damaging effects of metabolic syndrome.  Nutrition   Overview of the Pritikin Eating Plan  Clinical staff conducted group or individual video education with verbal and written material and guidebook.  Patient learns about the Pritikin Eating Plan for disease risk reduction. The Pritikin Eating Plan emphasizes a wide variety of unrefined, minimally-processed carbohydrates, like fruits, vegetables,  whole grains, and legumes. Go, Caution, and Stop food choices are explained. Plant-based and lean animal proteins are emphasized. Rationale provided for low sodium intake for blood pressure control, low added sugars for blood sugar stabilization, and low added fats and oils for coronary artery disease risk reduction and weight management.  Calorie Density  Clinical staff conducted group or individual video education with verbal and written material and guidebook.  Patient learns about calorie density and how it impacts the Pritikin Eating Plan. Knowing the characteristics of the food you choose will help you decide whether those foods will lead to weight gain or weight loss, and whether you want to consume more or less of them. Weight loss is usually a side effect of the Pritikin Eating Plan because of its focus on low calorie-dense foods.  Label Reading  Clinical staff conducted group or individual video education with verbal and written material and guidebook.  Patient learns about the Pritikin recommended label reading guidelines and corresponding recommendations regarding calorie density, added sugars, sodium content, and whole grains.  Dining Out - Part 1  Clinical staff conducted group or individual video education with verbal and written material and guidebook.  Patient learns that restaurant meals can be sabotaging because they can be so high in calories, fat, sodium, and/or sugar. Patient learns recommended strategies on how to positively address this and avoid unhealthy pitfalls.  Facts on Fats  Clinical staff conducted group or individual video education with verbal and written material and guidebook.  Patient learns that lifestyle modifications can be just as effective, if not more so, as many medications for lowering your risk of heart disease. A Pritikin lifestyle can help to reduce your risk of inflammation and atherosclerosis (cholesterol build-up, or plaque, in the artery walls).  Lifestyle interventions such as dietary choices and physical activity address the cause of atherosclerosis. A review of the types of fats and their impact on blood cholesterol levels, along with dietary recommendations to reduce fat intake is also included.  Nutrition Action Plan  Clinical staff conducted group or individual video education with verbal and written material and guidebook.  Patient learns how to incorporate Pritikin recommendations into their lifestyle. Recommendations include planning and keeping personal health goals in mind as an important part of their success.  Healthy Mind-Set    Healthy Minds, Bodies, Hearts  Clinical staff conducted group or individual video education with verbal and written material and guidebook.  Patient learns how to identify when they are stressed. Video will discuss the impact of that stress, as well as the many benefits of stress management. Patient will also be introduced to stress management techniques. The way we think, act, and feel has an impact on our hearts.  How Our Thoughts Can Heal Our Hearts  Clinical staff conducted group or individual video education with verbal and written material and guidebook.  Patient learns that negative thoughts can cause depression and anxiety. This can result in negative lifestyle behavior and serious health problems. Cognitive behavioral therapy is an effective method to help control our thoughts in order to change and improve our emotional outlook.  Additional Videos:  Exercise    Improving Performance  Clinical staff conducted  group or individual video education with verbal and written material and guidebook.  Patient learns to use a non-linear approach by alternating intensity levels and lengths of time spent exercising to help burn more calories and lose more body fat. Cardiovascular exercise helps improve heart health, metabolism, hormonal balance, blood sugar control, and recovery from fatigue. Resistance  training improves strength, endurance, balance, coordination, reaction time, metabolism, and muscle mass. Flexibility exercise improves circulation, posture, and balance. Seek guidance from your physician and exercise physiologist before implementing an exercise routine and learn your capabilities and proper form for all exercise.  Introduction to Yoga  Clinical staff conducted group or individual video education with verbal and written material and guidebook.  Patient learns about yoga, a discipline of the coming together of mind, breath, and body. The benefits of yoga include improved flexibility, improved range of motion, better posture and core strength, increased lung function, weight loss, and positive self-image. Yoga's heart health benefits include lowered blood pressure, healthier heart rate, decreased cholesterol and triglyceride levels, improved immune function, and reduced stress. Seek guidance from your physician and exercise physiologist before implementing an exercise routine and learn your capabilities and proper form for all exercise.  Medical   Aging: Enhancing Your Quality of Life  Clinical staff conducted group or individual video education with verbal and written material and guidebook.  Patient learns key strategies and recommendations to stay in good physical health and enhance quality of life, such as prevention strategies, having an advocate, securing a Health Care Proxy and Power of Attorney, and keeping a list of medications and system for tracking them. It also discusses how to avoid risk for bone loss.  Biology of Weight Control  Clinical staff conducted group or individual video education with verbal and written material and guidebook.  Patient learns that weight gain occurs because we consume more calories than we burn (eating more, moving less). Even if your body weight is normal, you may have higher ratios of fat compared to muscle mass. Too much body fat puts you at  increased risk for cardiovascular disease, heart attack, stroke, type 2 diabetes, and obesity-related cancers. In addition to exercise, following the Pritikin Eating Plan can help reduce your risk.  Decoding Lab Results  Clinical staff conducted group or individual video education with verbal and written material and guidebook.  Patient learns that lab test reflects one measurement whose values change over time and are influenced by many factors, including medication, stress, sleep, exercise, food, hydration, pre-existing medical conditions, and more. It is recommended to use the knowledge from this video to become more involved with your lab results and evaluate your numbers to speak with your doctor.   Diseases of Our Time - Overview  Clinical staff conducted group or individual video education with verbal and written material and guidebook.  Patient learns that according to the CDC, 50% to 70% of chronic diseases (such as obesity, type 2 diabetes, elevated lipids, hypertension, and heart disease) are avoidable through lifestyle improvements including healthier food choices, listening to satiety cues, and increased physical activity.  Sleep Disorders Clinical staff conducted group or individual video education with verbal and written material and guidebook.  Patient learns how good quality and duration of sleep are important to overall health and well-being. Patient also learns about sleep disorders and how they impact health along with recommendations to address them, including discussing with a physician.  Nutrition  Dining Out - Part 2 Clinical staff conducted group or individual video education with  verbal and written material and guidebook.  Patient learns how to plan ahead and communicate in order to maximize their dining experience in a healthy and nutritious manner. Included are recommended food choices based on the type of restaurant the patient is visiting.   Fueling a Fish farm manager conducted group or individual video education with verbal and written material and guidebook.  There is a strong connection between our food choices and our health. Diseases like obesity and type 2 diabetes are very prevalent and are in large-part due to lifestyle choices. The Pritikin Eating Plan provides plenty of food and hunger-curbing satisfaction. It is easy to follow, affordable, and helps reduce health risks.  Menu Workshop  Clinical staff conducted group or individual video education with verbal and written material and guidebook.  Patient learns that restaurant meals can sabotage health goals because they are often packed with calories, fat, sodium, and sugar. Recommendations include strategies to plan ahead and to communicate with the manager, chef, or server to help order a healthier meal.  Planning Your Eating Strategy  Clinical staff conducted group or individual video education with verbal and written material and guidebook.  Patient learns about the Pritikin Eating Plan and its benefit of reducing the risk of disease. The Pritikin Eating Plan does not focus on calories. Instead, it emphasizes high-quality, nutrient-rich foods. By knowing the characteristics of the foods, we choose, we can determine their calorie density and make informed decisions.  Targeting Your Nutrition Priorities  Clinical staff conducted group or individual video education with verbal and written material and guidebook.  Patient learns that lifestyle habits have a tremendous impact on disease risk and progression. This video provides eating and physical activity recommendations based on your personal health goals, such as reducing LDL cholesterol, losing weight, preventing or controlling type 2 diabetes, and reducing high blood pressure.  Vitamins and Minerals  Clinical staff conducted group or individual video education with verbal and written material and guidebook.  Patient learns different  ways to obtain key vitamins and minerals, including through a recommended healthy diet. It is important to discuss all supplements you take with your doctor.   Healthy Mind-Set    Smoking Cessation  Clinical staff conducted group or individual video education with verbal and written material and guidebook.  Patient learns that cigarette smoking and tobacco addiction pose a serious health risk which affects millions of people. Stopping smoking will significantly reduce the risk of heart disease, lung disease, and many forms of cancer. Recommended strategies for quitting are covered, including working with your doctor to develop a successful plan.  Culinary   Becoming a Set designer conducted group or individual video education with verbal and written material and guidebook.  Patient learns that cooking at home can be healthy, cost-effective, quick, and puts them in control. Keys to cooking healthy recipes will include looking at your recipe, assessing your equipment needs, planning ahead, making it simple, choosing cost-effective seasonal ingredients, and limiting the use of added fats, salts, and sugars.  Cooking - Breakfast and Snacks  Clinical staff conducted group or individual video education with verbal and written material and guidebook.  Patient learns how important breakfast is to satiety and nutrition through the entire day. Recommendations include key foods to eat during breakfast to help stabilize blood sugar levels and to prevent overeating at meals later in the day. Planning ahead is also a key component.  Cooking - Educational psychologist conducted  group or individual video education with verbal and written material and guidebook.  Patient learns eating strategies to improve overall health, including an approach to cook more at home. Recommendations include thinking of animal protein as a side on your plate rather than center stage and focusing instead on  lower calorie dense options like vegetables, fruits, whole grains, and plant-based proteins, such as beans. Making sauces in large quantities to freeze for later and leaving the skin on your vegetables are also recommended to maximize your experience.  Cooking - Healthy Salads and Dressing Clinical staff conducted group or individual video education with verbal and written material and guidebook.  Patient learns that vegetables, fruits, whole grains, and legumes are the foundations of the Pritikin Eating Plan. Recommendations include how to incorporate each of these in flavorful and healthy salads, and how to create homemade salad dressings. Proper handling of ingredients is also covered. Cooking - Soups and [employer] - Soups and Desserts Clinical staff conducted group or individual video education with verbal and written material and guidebook.  Patient learns that Pritikin soups and desserts make for easy, nutritious, and delicious snacks and meal components that are low in sodium, fat, sugar, and calorie density, while high in vitamins, minerals, and filling fiber. Recommendations include simple and healthy ideas for soups and desserts.   Overview     The Pritikin Solution Program Overview Clinical staff conducted group or individual video education with verbal and written material and guidebook.  Patient learns that the results of the Pritikin Program have been documented in more than 100 articles published in peer-reviewed journals, and the benefits include reducing risk factors for (and, in some cases, even reversing) high cholesterol, high blood pressure, type 2 diabetes, obesity, and more! An overview of the three key pillars of the Pritikin Program will be covered: eating well, doing regular exercise, and having a healthy mind-set.  WORKSHOPS  Exercise: Exercise Basics: Building Your Action Plan Clinical staff led group instruction and group discussion with PowerPoint presentation  and patient guidebook. To enhance the learning environment the use of posters, models and videos may be added. At the conclusion of this workshop, patients will comprehend the difference between physical activity and exercise, as well as the benefits of incorporating both, into their routine. Patients will understand the FITT (Frequency, Intensity, Time, and Type) principle and how to use it to build an exercise action plan. In addition, safety concerns and other considerations for exercise and cardiac rehab will be addressed by the presenter. The purpose of this lesson is to promote a comprehensive and effective weekly exercise routine in order to improve patients' overall level of fitness.   Managing Heart Disease: Your Path to a Healthier Heart Clinical staff led group instruction and group discussion with PowerPoint presentation and patient guidebook. To enhance the learning environment the use of posters, models and videos may be added.At the conclusion of this workshop, patients will understand the anatomy and physiology of the heart. Additionally, they will understand how Pritikin's three pillars impact the risk factors, the progression, and the management of heart disease.  The purpose of this lesson is to provide a high-level overview of the heart, heart disease, and how the Pritikin lifestyle positively impacts risk factors.  Exercise Biomechanics Clinical staff led group instruction and group discussion with PowerPoint presentation and patient guidebook. To enhance the learning environment the use of posters, models and videos may be added. Patients will learn how the structural parts of their bodies function  and how these functions impact their daily activities, movement, and exercise. Patients will learn how to promote a neutral spine, learn how to manage pain, and identify ways to improve their physical movement in order to promote healthy living. The purpose of this lesson is to  expose patients to common physical limitations that impact physical activity. Participants will learn practical ways to adapt and manage aches and pains, and to minimize their effect on regular exercise. Patients will learn how to maintain good posture while sitting, walking, and lifting.  Balance Training and Fall Prevention  Clinical staff led group instruction and group discussion with PowerPoint presentation and patient guidebook. To enhance the learning environment the use of posters, models and videos may be added. At the conclusion of this workshop, patients will understand the importance of their sensorimotor skills (vision, proprioception, and the vestibular system) in maintaining their ability to balance as they age. Patients will apply a variety of balancing exercises that are appropriate for their current level of function. Patients will understand the common causes for poor balance, possible solutions to these problems, and ways to modify their physical environment in order to minimize their fall risk. The purpose of this lesson is to teach patients about the importance of maintaining balance as they age and ways to minimize their risk of falling.  WORKSHOPS   Nutrition:  Fueling a Ship broker led group instruction and group discussion with PowerPoint presentation and patient guidebook. To enhance the learning environment the use of posters, models and videos may be added. Patients will review the foundational principles of the Pritikin Eating Plan and understand what constitutes a serving size in each of the food groups. Patients will also learn Pritikin-friendly foods that are better choices when away from home and review make-ahead meal and snack options. Calorie density will be reviewed and applied to three nutrition priorities: weight maintenance, weight loss, and weight gain. The purpose of this lesson is to reinforce (in a group setting) the key concepts around  what patients are recommended to eat and how to apply these guidelines when away from home by planning and selecting Pritikin-friendly options. Patients will understand how calorie density may be adjusted for different weight management goals.  Mindful Eating  Clinical staff led group instruction and group discussion with PowerPoint presentation and patient guidebook. To enhance the learning environment the use of posters, models and videos may be added. Patients will briefly review the concepts of the Pritikin Eating Plan and the importance of low-calorie dense foods. The concept of mindful eating will be introduced as well as the importance of paying attention to internal hunger signals. Triggers for non-hunger eating and techniques for dealing with triggers will be explored. The purpose of this lesson is to provide patients with the opportunity to review the basic principles of the Pritikin Eating Plan, discuss the value of eating mindfully and how to measure internal cues of hunger and fullness using the Hunger Scale. Patients will also discuss reasons for non-hunger eating and learn strategies to use for controlling emotional eating.  Targeting Your Nutrition Priorities Clinical staff led group instruction and group discussion with PowerPoint presentation and patient guidebook. To enhance the learning environment the use of posters, models and videos may be added. Patients will learn how to determine their genetic susceptibility to disease by reviewing their family history. Patients will gain insight into the importance of diet as part of an overall healthy lifestyle in mitigating the impact of genetics and other environmental  insults. The purpose of this lesson is to provide patients with the opportunity to assess their personal nutrition priorities by looking at their family history, their own health history and current risk factors. Patients will also be able to discuss ways of prioritizing and  modifying the Pritikin Eating Plan for their highest risk areas  Menu  Clinical staff led group instruction and group discussion with PowerPoint presentation and patient guidebook. To enhance the learning environment the use of posters, models and videos may be added. Using menus brought in from [employer], or printed from [employer], patients will apply the Pritikin dining out guidelines that were presented in the [employer] video. Patients will also be able to practice these guidelines in a variety of provided scenarios. The purpose of this lesson is to provide patients with the opportunity to practice hands-on learning of the Pritikin Dining Out guidelines with actual menus and practice scenarios.  Label Reading Clinical staff led group instruction and group discussion with PowerPoint presentation and patient guidebook. To enhance the learning environment the use of posters, models and videos may be added. Patients will review and discuss the Pritikin label reading guidelines presented in Pritikin's Label Reading Educational series video. Using fool labels brought in from local grocery stores and markets, patients will apply the label reading guidelines and determine if the packaged food meet the Pritikin guidelines. The purpose of this lesson is to provide patients with the opportunity to review, discuss, and practice hands-on learning of the Pritikin Label Reading guidelines with actual packaged food labels. Cooking School  Pritikin's [employer] are designed to teach patients ways to prepare quick, simple, and affordable recipes at home. The importance of nutrition's role in chronic disease risk reduction is reflected in its emphasis in the overall Pritikin program. By learning how to prepare essential core Pritikin Eating Plan recipes, patients will increase control over what they eat; be able to customize the flavor of foods without the use of added salt,  sugar, or fat; and improve the quality of the food they consume. By learning a set of core recipes which are easily assembled, quickly prepared, and affordable, patients are more likely to prepare more healthy foods at home. These workshops focus on convenient breakfasts, simple entres, side dishes, and desserts which can be prepared with minimal effort and are consistent with nutrition recommendations for cardiovascular risk reduction. Cooking [employer] are taught by a Armed forces logistics/support/administrative officer (RD) who has been trained by the [employer]. The chef or RD has a clear understanding of the importance of minimizing - if not completely eliminating - added fat, sugar, and sodium in recipes. Throughout the series of Cooking School Workshop sessions, patients will learn about healthy ingredients and efficient methods of cooking to build confidence in their capability to prepare    Cooking School weekly topics:  Adding Flavor- Sodium-Free  Fast and Healthy Breakfasts  Powerhouse Plant-Based Proteins  Satisfying Salads and Dressings  Simple Sides and Sauces  International Cuisine-Spotlight on the [employer] Zones  Delicious Desserts  Savory Soups  [employer] - Meals in a Astronomer Appetizers and Snacks  Comforting Weekend Breakfasts  One-Pot Wonders   Fast Evening Meals  Landscape architect Your Pritikin Plate  WORKSHOPS   Healthy Mindset (Psychosocial):  Focused Goals, Sustainable Changes Clinical staff led group instruction and group discussion with PowerPoint presentation and patient guidebook. To enhance the learning environment the use of posters, models and videos  may be added. Patients will be able to apply effective goal setting strategies to establish at least one personal goal, and then take consistent, meaningful action toward that goal. They will learn to identify common barriers to achieving personal goals and develop strategies to overcome  them. Patients will also gain an understanding of how our mind-set can impact our ability to achieve goals and the importance of cultivating a positive and growth-oriented mind-set. The purpose of this lesson is to provide patients with a deeper understanding of how to set and achieve personal goals, as well as the tools and strategies needed to overcome common obstacles which may arise along the way.  From Head to Heart: The Power of a Healthy Outlook  Clinical staff led group instruction and group discussion with PowerPoint presentation and patient guidebook. To enhance the learning environment the use of posters, models and videos may be added. Patients will be able to recognize and describe the impact of emotions and mood on physical health. They will discover the importance of self-care and explore self-care practices which may work for them. Patients will also learn how to utilize the 4 C's to cultivate a healthier outlook and better manage stress and challenges. The purpose of this lesson is to demonstrate to patients how a healthy outlook is an essential part of maintaining good health, especially as they continue their cardiac rehab journey.  Healthy Sleep for a Healthy Heart Clinical staff led group instruction and group discussion with PowerPoint presentation and patient guidebook. To enhance the learning environment the use of posters, models and videos may be added. At the conclusion of this workshop, patients will be able to demonstrate knowledge of the importance of sleep to overall health, well-being, and quality of life. They will understand the symptoms of, and treatments for, common sleep disorders. Patients will also be able to identify daytime and nighttime behaviors which impact sleep, and they will be able to apply these tools to help manage sleep-related challenges. The purpose of this lesson is to provide patients with a general overview of sleep and outline the importance of quality  sleep. Patients will learn about a few of the most common sleep disorders. Patients will also be introduced to the concept of "sleep hygiene," and discover ways to self-manage certain sleeping problems through simple daily behavior changes. Finally, the workshop will motivate patients by clarifying the links between quality sleep and their goals of heart-healthy living.   Recognizing and Reducing Stress Clinical staff led group instruction and group discussion with PowerPoint presentation and patient guidebook. To enhance the learning environment the use of posters, models and videos may be added. At the conclusion of this workshop, patients will be able to understand the types of stress reactions, differentiate between acute and chronic stress, and recognize the impact that chronic stress has on their health. They will also be able to apply different coping mechanisms, such as reframing negative self-talk. Patients will have the opportunity to practice a variety of stress management techniques, such as deep abdominal breathing, progressive muscle relaxation, and/or guided imagery.  The purpose of this lesson is to educate patients on the role of stress in their lives and to provide healthy techniques for coping with it.  Learning Barriers/Preferences:  Learning Barriers/Preferences - 09/08/23 1639       Learning Barriers/Preferences   Learning Barriers Hearing   hearing aids   Learning Preferences Group Instruction;Individual Instruction;Pictoral;Skilled Demonstration;Verbal Instruction;Written Material;Video  Education Topics:  Knowledge Questionnaire Score:  Knowledge Questionnaire Score - 09/08/23 1638       Knowledge Questionnaire Score   Pre Score 19/24             Core Components/Risk Factors/Patient Goals at Admission:  Personal Goals and Risk Factors at Admission - 09/08/23 1638       Core Components/Risk Factors/Patient Goals on Admission    Weight  Management Yes;Weight Maintenance    Intervention Weight Management: Develop a combined nutrition and exercise program designed to reach desired caloric intake, while maintaining appropriate intake of nutrient and fiber, sodium and fats, and appropriate energy expenditure required for the weight goal.;Weight Management: Provide education and appropriate resources to help participant work on and attain dietary goals.    Expected Outcomes Short Term: Continue to assess and modify interventions until short term weight is achieved;Long Term: Adherence to nutrition and physical activity/exercise program aimed toward attainment of established weight goal;Weight Maintenance: Understanding of the daily nutrition guidelines, which includes 25-35% calories from fat, 7% or less cal from saturated fats, less than 200mg  cholesterol, less than 1.5gm of sodium, & 5 or more servings of fruits and vegetables daily;Understanding recommendations for meals to include 15-35% energy as protein, 25-35% energy from fat, 35-60% energy from carbohydrates, less than 200mg  of dietary cholesterol, 20-35 gm of total fiber daily;Understanding of distribution of calorie intake throughout the day with the consumption of 4-5 meals/snacks    Heart Failure Yes    Intervention Provide a combined exercise and nutrition program that is supplemented with education, support and counseling about heart failure. Directed toward relieving symptoms such as shortness of breath, decreased exercise tolerance, and extremity edema.    Expected Outcomes Improve functional capacity of life;Short term: Attendance in program 2-3 days a week with increased exercise capacity. Reported lower sodium intake. Reported increased fruit and vegetable intake. Reports medication compliance.;Short term: Daily weights obtained and reported for increase. Utilizing diuretic protocols set by physician.;Long term: Adoption of self-care skills and reduction of barriers for early  signs and symptoms recognition and intervention leading to self-care maintenance.    Hypertension Yes    Intervention Provide education on lifestyle modifcations including regular physical activity/exercise, weight management, moderate sodium restriction and increased consumption of fresh fruit, vegetables, and low fat dairy, alcohol moderation, and smoking cessation.;Monitor prescription use compliance.    Expected Outcomes Short Term: Continued assessment and intervention until BP is < 140/86mm HG in hypertensive participants. < 130/50mm HG in hypertensive participants with diabetes, heart failure or chronic kidney disease.;Long Term: Maintenance of blood pressure at goal levels.    Lipids Yes    Intervention Provide education and support for participant on nutrition & aerobic/resistive exercise along with prescribed medications to achieve LDL 70mg , HDL >40mg .    Expected Outcomes Short Term: Participant states understanding of desired cholesterol values and is compliant with medications prescribed. Participant is following exercise prescription and nutrition guidelines.;Long Term: Cholesterol controlled with medications as prescribed, with individualized exercise RX and with personalized nutrition plan. Value goals: LDL < 70mg , HDL > 40 mg.             Core Components/Risk Factors/Patient Goals Review:   Goals and Risk Factor Review     Row Name 09/12/23 1654 10/04/23 1554 10/20/23 1221         Core Components/Risk Factors/Patient Goals Review   Personal Goals Review Weight Management/Obesity;Hypertension;Lipids;Heart Failure Weight Management/Obesity;Hypertension;Lipids;Heart Failure Weight Management/Obesity;Hypertension;Lipids;Heart Failure     Review Jan did well with exercise  for her fitness level. Vital signs were stable. Patient ueses a cane for stability Jan is off to a good start to exercise  Vital signs have been stable. Patient ueses a cane for stability Jan continues to do well  with exercise at cardiac rehab for her fitness level.  Vital signs remain stable.     Expected Outcomes Jan will continue to participate in cardiac rehab for exercise, nutrition and lifestyle modifications. Jan will continue to participate in cardiac rehab for exercise, nutrition and lifestyle modifications. Jan will continue to participate in cardiac rehab for exercise, nutrition and lifestyle modifications.              Core Components/Risk Factors/Patient Goals at Discharge (Final Review):   Goals and Risk Factor Review - 10/20/23 1221       Core Components/Risk Factors/Patient Goals Review   Personal Goals Review Weight Management/Obesity;Hypertension;Lipids;Heart Failure    Review Jan continues to do well with exercise at cardiac rehab for her fitness level.  Vital signs remain stable.    Expected Outcomes Jan will continue to participate in cardiac rehab for exercise, nutrition and lifestyle modifications.             ITP Comments:  ITP Comments     Row Name 09/08/23 1625 09/12/23 1652 10/04/23 1551 10/20/23 1207     ITP Comments Dr. Armanda Magic medical director. Introduction to pritikin education/ intensive cardiac rehab. Initial orientation packet reviewed with patient. 30 day ITP Review. Jan started cardiac rehab on 09/12/23. Jan did well with exercise for her fitness level. 30 day ITP Review. Jan has good attendance and participation in  cardiac rehab. Jan is enjoying the program. 30 day ITP Review. Jan continues to have  good attendance and participation in  cardiac rehab.             Comments: See ITP comments.Thayer Headings RN BSN

## 2023-10-20 NOTE — Telephone Encounter (Signed)
Pt states she has been dealing with Incontinence and Dr. Marlou Porch prescribed the following meds. She states she read they may interfere with her metoprolol so she wants to know if Dr. [name] is okay with her taking it.   Mirabegron 50 mg  Desmopressin 0.2 mg

## 2023-10-21 ENCOUNTER — Encounter (HOSPITAL_COMMUNITY)
Admission: RE | Admit: 2023-10-21 | Discharge: 2023-10-21 | Disposition: A | Discharge status: Home / Self Care | Payer: Medicare Other | Source: Ambulatory Visit | Attending: Cardiovascular Disease | Admitting: Cardiovascular Disease

## 2023-10-21 DIAGNOSIS — Z48812 Encounter for surgical aftercare following surgery on the circulatory system: Secondary | ICD-10-CM | POA: Diagnosis not present

## 2023-10-21 DIAGNOSIS — Z952 Presence of prosthetic heart valve: Secondary | ICD-10-CM | POA: Diagnosis not present

## 2023-10-21 NOTE — Telephone Encounter (Signed)
[name], [facility] [place]  You30 minutes ago (12:45 PM)   It is ok to use those 2 meds with metoprolol.  however, there is interaction between metoprolol  and mirabegron, patient need to closely monitor her BP. Mirabagron can potentiate metoprolol effect and mirabegron common s/e includes elevation in BP mainly at 50 mg dose. hopefully it levels it out.   Called patient, but no answer. Left detailed message per DPR. Advised she call back if any other questions.

## 2023-10-24 ENCOUNTER — Encounter (HOSPITAL_COMMUNITY): Admission: RE | Admit: 2023-10-24 | Payer: Medicare Other | Source: Ambulatory Visit

## 2023-10-26 ENCOUNTER — Encounter (HOSPITAL_COMMUNITY)
Admission: RE | Admit: 2023-10-26 | Discharge: 2023-10-26 | Disposition: A | Discharge status: Home / Self Care | Payer: Medicare Other | Source: Ambulatory Visit | Attending: Cardiovascular Disease | Admitting: Cardiovascular Disease

## 2023-10-26 DIAGNOSIS — Z48812 Encounter for surgical aftercare following surgery on the circulatory system: Secondary | ICD-10-CM | POA: Diagnosis not present

## 2023-10-26 DIAGNOSIS — Z952 Presence of prosthetic heart valve: Secondary | ICD-10-CM | POA: Diagnosis not present

## 2023-10-28 ENCOUNTER — Other Ambulatory Visit: Payer: Self-pay | Admitting: Internal Medicine

## 2023-10-28 ENCOUNTER — Encounter (HOSPITAL_COMMUNITY): Payer: Medicare Other

## 2023-10-31 ENCOUNTER — Encounter (HOSPITAL_COMMUNITY)
Admission: RE | Admit: 2023-10-31 | Discharge: 2023-10-31 | Disposition: A | Discharge status: Home / Self Care | Payer: Medicare Other | Source: Ambulatory Visit | Attending: Cardiovascular Disease | Admitting: Cardiovascular Disease

## 2023-10-31 DIAGNOSIS — Z952 Presence of prosthetic heart valve: Secondary | ICD-10-CM | POA: Diagnosis not present

## 2023-10-31 DIAGNOSIS — Z48812 Encounter for surgical aftercare following surgery on the circulatory system: Secondary | ICD-10-CM | POA: Diagnosis not present

## 2023-11-01 DIAGNOSIS — R351 Nocturia: Secondary | ICD-10-CM | POA: Diagnosis not present

## 2023-11-02 ENCOUNTER — Encounter (HOSPITAL_COMMUNITY)
Admission: RE | Admit: 2023-11-02 | Discharge: 2023-11-02 | Disposition: A | Discharge status: Home / Self Care | Payer: Medicare Other | Source: Ambulatory Visit | Attending: Cardiovascular Disease | Admitting: Cardiovascular Disease

## 2023-11-02 DIAGNOSIS — Z952 Presence of prosthetic heart valve: Secondary | ICD-10-CM

## 2023-11-02 DIAGNOSIS — Z48812 Encounter for surgical aftercare following surgery on the circulatory system: Secondary | ICD-10-CM | POA: Diagnosis not present

## 2023-11-04 ENCOUNTER — Encounter (HOSPITAL_COMMUNITY)
Admission: RE | Admit: 2023-11-04 | Discharge: 2023-11-04 | Disposition: A | Discharge status: Home / Self Care | Payer: Medicare Other | Source: Ambulatory Visit | Attending: Cardiovascular Disease | Admitting: Cardiovascular Disease

## 2023-11-04 ENCOUNTER — Other Ambulatory Visit: Payer: Self-pay | Admitting: Cardiovascular Disease

## 2023-11-04 DIAGNOSIS — Z952 Presence of prosthetic heart valve: Secondary | ICD-10-CM | POA: Diagnosis not present

## 2023-11-04 DIAGNOSIS — Z48812 Encounter for surgical aftercare following surgery on the circulatory system: Secondary | ICD-10-CM | POA: Diagnosis not present

## 2023-11-07 ENCOUNTER — Ambulatory Visit: Payer: Medicare Other | Admitting: Surgery

## 2023-11-07 ENCOUNTER — Encounter (HOSPITAL_COMMUNITY): Payer: Medicare Other

## 2023-11-10 ENCOUNTER — Telehealth: Payer: Self-pay | Admitting: Cardiovascular Disease

## 2023-11-10 NOTE — Telephone Encounter (Signed)
See notes from pre op APP. Need to confirm if [facility] is still needing clearance.

## 2023-11-10 NOTE — Telephone Encounter (Signed)
   Patient Name: [name]  DOB: Oct 24, 1941 MRN: [id number]  Primary Cardiologist: Tonny Bollman, MD  Chart reviewed as part of pre-operative protocol coverage. Dental cleanings are considered low risk procedures per guidelines and generally do not require any specific cardiac clearance. It is also generally accepted that for simple extractions and dental cleanings, there is no need to interrupt blood thinner therapy.   SBE prophylaxis is required for this patient, she is to take doxycycline 200 mg one hour prior to dental work.   Called dental works for clarification as they report it is office policy that patient have medical clearance even for dental cleaning after any valve procedure. Attempted to call patient 3 times while at dental works at the request of office, phone call was not answered.  Was able to reach patient to discuss need for medical clearance at 4:30 PM.  Patient reported she was unsure if she still needed medical clearance at that time and requested I call dental works to confirm need for medical clearance.  Left voicemail for [facility] requesting a call back to confirm need for clearance at this time.   Rip Harbour, NP 11/10/2023, 2:34 PM

## 2023-11-10 NOTE — Telephone Encounter (Signed)
   Pre-operative Risk Assessment    Patient Name: [name]  DOB: 1941/06/29 MRN: [id number]      Request for Surgical Clearance    Procedure:   Cleaning and X-Rays   Date of Surgery:  Clearance 11/10/23                                 Surgeon:  Dr. Satira Sark Group or Practice Name[facility]  Phone number:  [phone] Fax number:  [phone]   Type of Clearance Requested:   - Medical    Type of Anesthesia:  None    Additional requests/questions:   In the chair right now   Signed, Emilie Rutter   11/10/2023, 2:18 PM

## 2023-11-11 ENCOUNTER — Encounter (HOSPITAL_COMMUNITY)
Admission: RE | Admit: 2023-11-11 | Discharge: 2023-11-11 | Disposition: A | Discharge status: Home / Self Care | Payer: Medicare Other | Source: Ambulatory Visit | Attending: Cardiovascular Disease | Admitting: Cardiovascular Disease

## 2023-11-11 DIAGNOSIS — Z48812 Encounter for surgical aftercare following surgery on the circulatory system: Secondary | ICD-10-CM | POA: Diagnosis not present

## 2023-11-11 DIAGNOSIS — Z952 Presence of prosthetic heart valve: Secondary | ICD-10-CM

## 2023-11-11 NOTE — Telephone Encounter (Signed)
Left message for [facility] to confirm if still needing the clearance they requested yesterday while pt was at their office. See previous notes.   Pre op APP Reather Littler, NP was able to reach the pt late afternoon yesterday. See notes.   I will fax notes to the dental office. If any questions they can call back and ask to s/w the pre op APP if needed.Marland Kitchen

## 2023-11-14 ENCOUNTER — Encounter (HOSPITAL_COMMUNITY)
Admission: RE | Admit: 2023-11-14 | Discharge: 2023-11-14 | Disposition: A | Discharge status: Home / Self Care | Payer: Medicare Other | Source: Ambulatory Visit | Attending: Cardiovascular Disease | Admitting: Cardiovascular Disease

## 2023-11-14 DIAGNOSIS — Z48812 Encounter for surgical aftercare following surgery on the circulatory system: Secondary | ICD-10-CM | POA: Diagnosis not present

## 2023-11-14 DIAGNOSIS — Z952 Presence of prosthetic heart valve: Secondary | ICD-10-CM

## 2023-11-14 IMAGING — DX DG CHEST 2V
2 series · 2 of 2 positions shown · non-contrast
Comparison: Chest x-ray 08/05/2017.

CLINICAL DATA: Chest tightness.

EXAM:
CHEST - 2 VIEW

[chest pa]
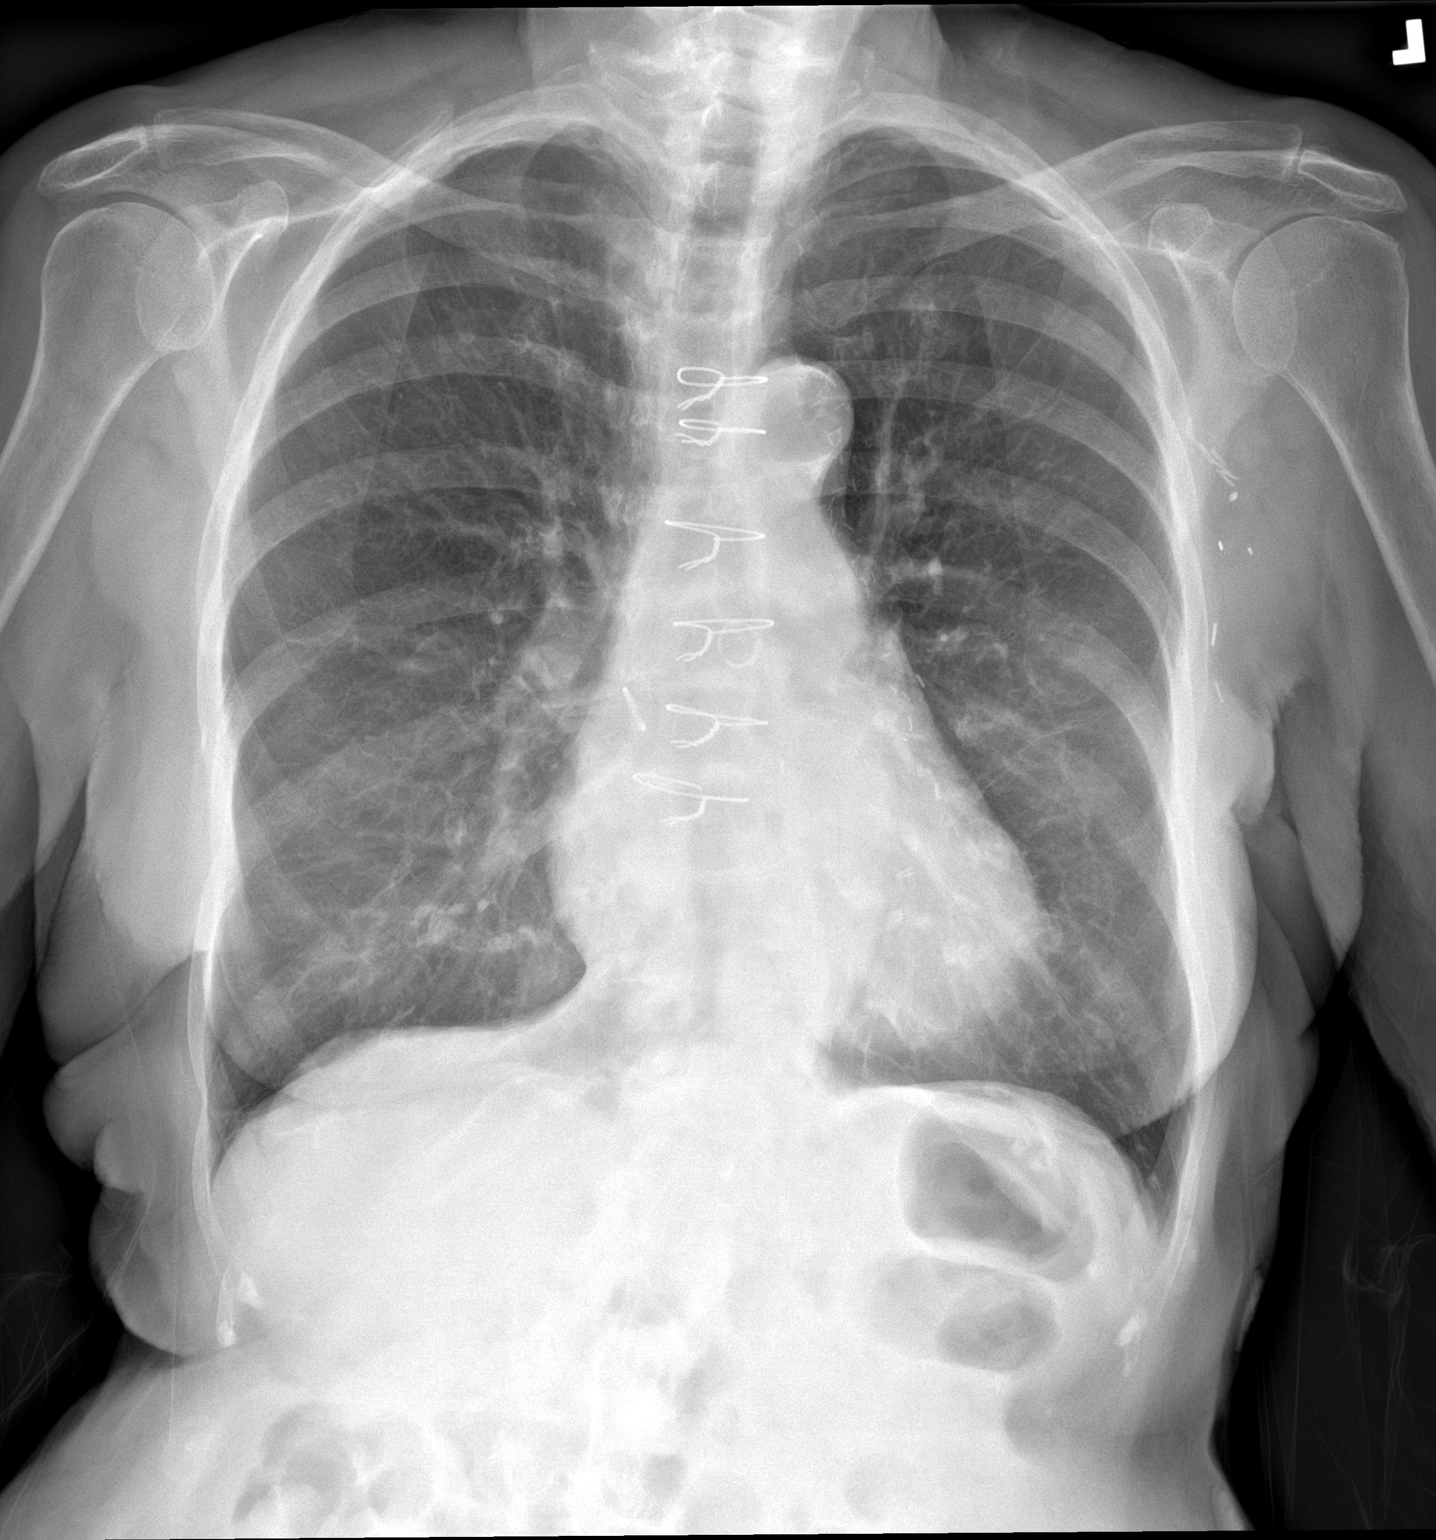

[chest lat]
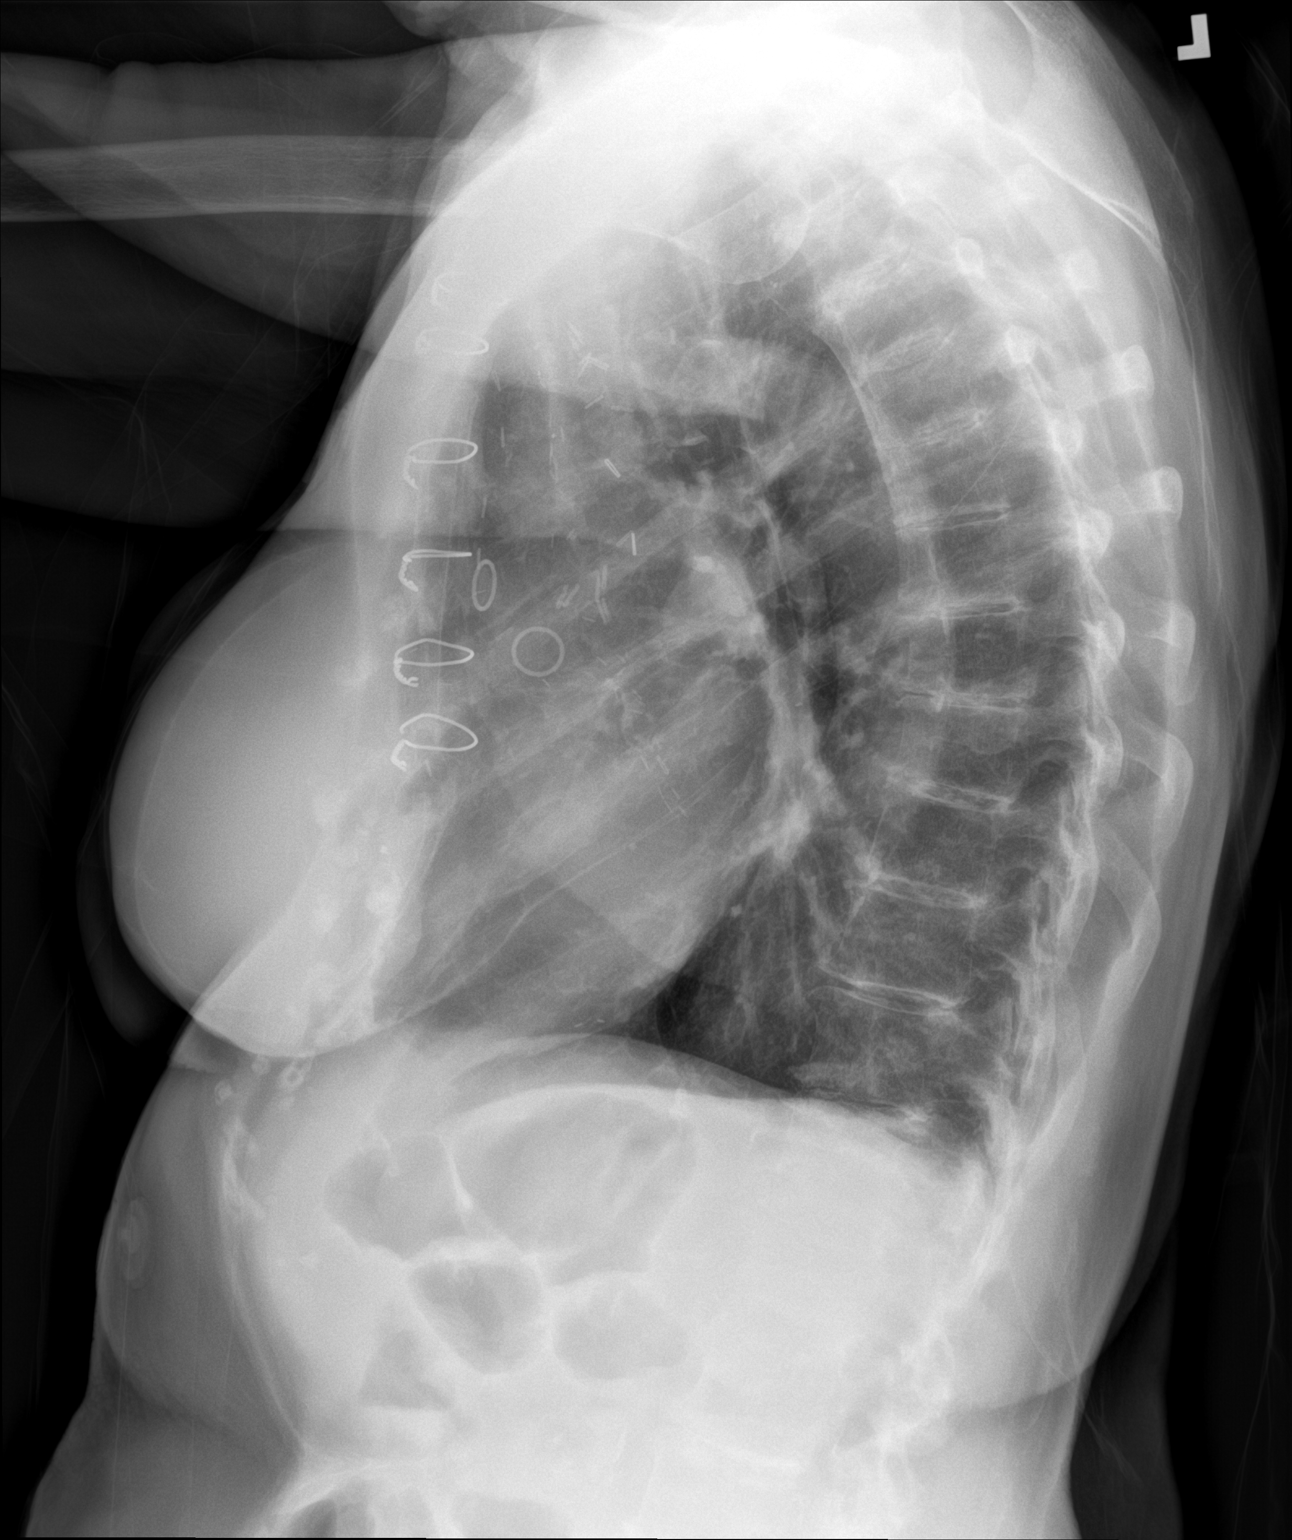

[2 of 2 positions shown; findings below may reference images not displayed]

FINDINGS: Sternotomy wires and mediastinal clips are again seen. Left axillary
surgical clips are again seen. The cardiomediastinal silhouette is
within normal limits. There is no focal lung consolidation, pleural
effusion or pneumothorax identified. No acute fractures are seen.
IMPRESSION: No active cardiopulmonary disease.

## 2023-11-18 ENCOUNTER — Telehealth (HOSPITAL_COMMUNITY): Payer: Self-pay | Admitting: *Deleted

## 2023-11-18 ENCOUNTER — Encounter (HOSPITAL_COMMUNITY): Payer: Medicare Other

## 2023-11-18 NOTE — Telephone Encounter (Signed)
 Spoke with [name]. [name] says that cardiac rehab has not been that helpful to her. [name] says she appreciates the staff and getting to know the other participants. [name] wonders is participating in physical therapy will be a better option for her as she has orthopedic issues. [name] says that she thinks she will have a $40 copay which is a concern. Will discharge from cardiac rehab as requested. Will forward this information to Dr Geofm. [name] has a follow up appointment in March.Angelica Elpidio Quan RN BSN

## 2023-11-21 ENCOUNTER — Encounter (HOSPITAL_COMMUNITY): Payer: Medicare Other

## 2023-11-22 DIAGNOSIS — R350 Frequency of micturition: Secondary | ICD-10-CM | POA: Diagnosis not present

## 2023-11-22 DIAGNOSIS — R3915 Urgency of urination: Secondary | ICD-10-CM | POA: Diagnosis not present

## 2023-11-23 ENCOUNTER — Encounter (HOSPITAL_COMMUNITY): Payer: Medicare Other

## 2023-11-25 ENCOUNTER — Encounter (HOSPITAL_COMMUNITY): Payer: Medicare Other

## 2023-11-28 ENCOUNTER — Ambulatory Visit: Payer: Medicare Other | Admitting: Cardiovascular Disease

## 2023-11-28 ENCOUNTER — Telehealth: Payer: Self-pay | Admitting: Cardiovascular Disease

## 2023-11-28 ENCOUNTER — Encounter (HOSPITAL_COMMUNITY): Payer: Medicare Other

## 2023-11-28 NOTE — Telephone Encounter (Signed)
 Called and spoke with patient, advised her to keep appt scheduled for this day and we'll work her in when she gets here (likely at end of day).

## 2023-11-28 NOTE — Telephone Encounter (Signed)
 Pt has a funeral at 1:00 and patient wants Nurse Maralyn Sago to talk to Dr [name] and she if he can see her before 1:40

## 2023-11-29 ENCOUNTER — Telehealth: Payer: Self-pay

## 2023-11-29 NOTE — Telephone Encounter (Signed)
 Copied from CRM [id number]. Topic: General - Billing Inquiry >> Nov 29, 2023  4:12 PM Viola F wrote: Reason for CRM: Patient request a call back from Carla regarding insurance denying claims for 2 ultrasounds that were done back in September 2024 - charged patient $800

## 2023-11-30 ENCOUNTER — Encounter (HOSPITAL_COMMUNITY): Payer: Medicare Other

## 2023-12-01 ENCOUNTER — Encounter: Payer: Self-pay | Admitting: Cardiovascular Disease

## 2023-12-01 ENCOUNTER — Ambulatory Visit: Payer: Medicare Other | Attending: Cardiovascular Disease | Admitting: Cardiovascular Disease

## 2023-12-01 VITALS — BP 110/64 | HR 75 | Ht 63.0 in | Wt 121.6 lb

## 2023-12-01 DIAGNOSIS — I10 Essential (primary) hypertension: Secondary | ICD-10-CM | POA: Diagnosis not present

## 2023-12-01 DIAGNOSIS — I5032 Chronic diastolic (congestive) heart failure: Secondary | ICD-10-CM | POA: Insufficient documentation

## 2023-12-01 DIAGNOSIS — Z952 Presence of prosthetic heart valve: Secondary | ICD-10-CM | POA: Diagnosis not present

## 2023-12-01 DIAGNOSIS — I25119 Atherosclerotic heart disease of native coronary artery with unspecified angina pectoris: Secondary | ICD-10-CM | POA: Insufficient documentation

## 2023-12-01 NOTE — Assessment & Plan Note (Signed)
Blood pressure well controlled

## 2023-12-01 NOTE — Patient Instructions (Signed)
Medication Instructions:  STOP Furosemide *If you need a refill on your cardiac medications before your next appointment, please call your pharmacy*  Follow-Up: At [facility], you and your health needs are our priority.  As part of our continuing mission to provide you with exceptional heart care, we have created designated Provider Care Teams.  These Care Teams include your primary Cardiologist (physician) and Advanced Practice Providers (APPs -  Physician Assistants and Nurse Practitioners) who all work together to provide you with the care you need, when you need it.  We recommend signing up for the patient portal called "MyChart".  Sign up information is provided on this After Visit Summary.  MyChart is used to connect with patients for Virtual Visits (Telemedicine).  Patients are able to view lab/test results, encounter notes, upcoming appointments, etc.  Non-urgent messages can be sent to your provider as well.   To learn more about what you can do with MyChart, go to [email or web address].    Your next appointment:   4 month(s)  Provider:   Tonny Bollman, MD      1st Floor: - Lobby - Registration  - Pharmacy  - Lab - Cafe  2nd Floor: - PV Lab - Diagnostic Testing (echo, CT, nuclear med)  3rd Floor: - Vacant  4th Floor: - TCTS (cardiothoracic surgery) - AFib Clinic - Structural Heart Clinic - Vascular Surgery  - Vascular Ultrasound  5th Floor: - HeartCare Cardiology (general and EP) - Clinical Pharmacy for coumadin, hypertension, lipid, weight-loss medications, and med management appointments    Valet parking services will be available as well.

## 2023-12-01 NOTE — Assessment & Plan Note (Signed)
The patient's last echocardiogram demonstrated normal LVEF and normal function of her TAVR bioprosthesis with a mean gradient of 9 mmHg and mild paravalvular regurgitation.  I will see her back in 3 to 4 months and she will have a 1 year TAVR echo per protocol in August 2025.

## 2023-12-01 NOTE — Progress Notes (Signed)
Cardiology Office Note:    Date:  12/01/2023   ID:  Angelica, Mullen 07/05/1941, MRN [id number]  PCP:  Pincus Sanes, MD   [facility] Providers Cardiologist:  Tonny Bollman, MD     Referring MD: Pincus Sanes, MD   Chief Complaint  Patient presents with   Follow-up    Aortic Stenosis    History of Present Illness:    [name] is a 83 y.o. female presenting for follow-up evaluation.  The patient has coronary artery disease dating back to her initial presentation in 2007 when she required multivessel CABG.  She developed aortic stenosis and underwent TAVR in August 2024, treated with a 20 mm SAPIEN 3 valve complicated by an ischemic right leg with need for surgical repair of an occluded right femoral artery.  Her postoperative valve function has been normal.  The patient is here alone today.  She is not experiencing any chest pain or shortness of breath.  She complains of generalized fatigue.  She has some mild right leg swelling ever since her vascular repair surgery.  No orthopnea or PND.  No heart palpitations.  She is compliant with her medications.  She has occasional lightheadedness.  She complains of urinary incontinence.   Current Medications: Current Meds  Medication Sig   aspirin 81 MG tablet Take 81 mg by mouth daily.     atorvastatin (LIPITOR) 20 MG tablet TAKE 1 TABLET BY MOUTH EVERY DAY   Calcium Carbonate (CALTRATE 600 PO) Take 1 tablet by mouth 2 (two) times daily.     citalopram (CELEXA) 20 MG tablet TAKE 1 TABLET BY MOUTH EVERY DAY   co-enzyme Q-10 30 MG capsule Take 30 mg by mouth daily.   doxycycline (VIBRAMYCIN) 100 MG capsule Take 1 capsule (100 mg total) by mouth as needed (Take two tablets by mouth one hour prior to dental appointments).   famotidine (PEPCID) 40 MG tablet TAKE 1 TABLET BY MOUTH EVERY DAY   fluticasone (FLONASE) 50 MCG/ACT nasal spray Place 1 spray into both nostrils daily as needed for allergies or rhinitis.   furosemide  (LASIX) 20 MG tablet TAKE 1 TABLET BY MOUTH EVERY DAY   levothyroxine (SYNTHROID) 75 MCG tablet TAKE 1 TABLET BY MOUTH EVERY MORNING BEFORE BREAKFAST   losartan (COZAAR) 50 MG tablet TAKE 1 TABLET BY MOUTH EVERY DAY   meloxicam (MOBIC) 7.5 MG tablet TAKE 1/2 TO 1 TABLET EVERY DAY   metoprolol tartrate (LOPRESSOR) 25 MG tablet TAKE 1 TABLET BY MOUTH TWICE A DAY   mirabegron ER (MYRBETRIQ) 50 MG TB24 tablet Take 50 mg by mouth daily.   Multiple Vitamins-Minerals (MULTIVITAMIN) LIQD Take 1 tablet by mouth daily.   Multiple Vitamins-Minerals (PRESERVISION AREDS 2) CAPS Take 1 capsule by mouth 2 (two) times daily.   senna-docusate (SENOKOT-S) 8.6-50 MG per tablet Take 1 tablet by mouth daily.     VITAMIN D, CHOLECALCIFEROL, PO Take 1 tablet by mouth daily.   zolpidem (AMBIEN) 5 MG tablet TAKE 1 TABLET (5 MG TOTAL) BY MOUTH EVERY DAY AT BEDTIME AS NEEDED FOR SLEEP     Allergies:   Clarithromycin, Clindamycin, Codeine, Levofloxacin, Morphine, Naproxen, Omeprazole, Penicillins, and Pneumococcal vaccines   ROS:   Please see the history of present illness.    All other systems reviewed and are negative.  EKGs/Labs/Other Studies Reviewed:    The following studies were reviewed today: Cardiac Studies & Procedures   CARDIAC CATHETERIZATION  CARDIAC CATHETERIZATION 06/09/2023  Narrative  Mid RCA lesion is 100% stenosed.   Prox Cx lesion is 99% stenosed.   Ost LAD to Prox LAD lesion is 90% stenosed.   SVG graft was visualized by angiography and is normal in caliber.   SVG graft was visualized by angiography and is normal in caliber.   LIMA graft was visualized by angiography and is normal in caliber.  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 Patent sequential vein graft to the LAD and Diagonal Severe proximal Circumflex stenosis. Patent LIMA graft to the obtuse marginal branch Chronic total occlusion of the mid RCA. The PDA and  posterolateral arteries fill from the sequential vein graft. Elevated right heart pressures RA 7 RV 47/7/9 PA 48/19 mean 35 PCWP 26   Recommendations: Continue workup for TAVR. Medical management of CAD  Findings Coronary Findings Diagnostic  Dominance: Right  Left Anterior Descending Ost LAD to Prox LAD lesion is 90% stenosed.  Left Circumflex Vessel is large. Prox Cx lesion is 99% stenosed.  Right Coronary Artery Vessel is large. Mid RCA lesion is 100% stenosed. The lesion is chronically occluded.  Sequential Saphenous Graft To 2nd RPL, RPDA SVG graft was visualized by angiography and is normal in caliber.  Sequential Saphenous Graft To 1st Diag, Mid LAD SVG graft was visualized by angiography and is normal in caliber.  LIMA LIMA Graft To 2nd Mrg LIMA graft was visualized by angiography and is normal in caliber.  Intervention  No interventions have been documented.   STRESS TESTS  MYOCARDIAL PERFUSION IMAGING 12/17/2021  Narrative   The study is normal. Findings are consistent with no prior ischemia and no prior myocardial infarction. The study is low risk.   No ST deviation was noted.   LV perfusion is normal. There is no evidence of ischemia. There is no evidence of infarction.   Left ventricular function is normal. Nuclear stress EF: 71 %. The left ventricular ejection fraction is hyperdynamic (>65%). End diastolic cavity size is normal. End systolic cavity size is normal.   Prior study available for comparison from 09/02/2017. No changes compared to prior study.  ECHOCARDIOGRAM  ECHOCARDIOGRAM COMPLETE 07/25/2023  Narrative ECHOCARDIOGRAM REPORT    Patient Name:   [name] Date of Exam: 07/25/2023 Medical Rec #:  [id number]     Height:       62.0 in Accession #:    [id number]    Weight:       125.0 lb Date of Birth:  1941-06-22      BSA:          1.566 m Patient Age:    82 years      BP:           132/58 mmHg Patient Gender: F             HR:            57 bpm. Exam Location:  [address]  Procedure: 2D Echo, Color Doppler, Cardiac Doppler and 3D Echo  Indications:    Post TAVR Evaluation Z95.2  History:        Patient has prior history of Echocardiogram examinations, most recent 06/22/2023. CAD, Prior CABG; Risk Factors:Hypertension and Dyslipidemia.  Sonographer:    Thurman Coyer RDCS Referring Phys: [phone] JILL D MCDANIEL  IMPRESSIONS   1. Left ventricular ejection fraction, by estimation, is 65 to 70%. The left ventricle has normal function. The left ventricle has no regional wall motion abnormalities. There  is mild left ventricular hypertrophy. Left ventricular diastolic parameters are consistent with Grade III diastolic dysfunction (restrictive). Elevated left atrial pressure. 2. Right ventricular systolic function is normal. The right ventricular size is normal. There is mildly elevated pulmonary artery systolic pressure. 3. Left atrial size was moderately dilated. 4. The mitral valve is normal in structure. Mild mitral valve regurgitation. 5. S/p TAVR (23 mm Edwards S3UR; procedure date 06/21/23) Peak and mean gradients through the valve are 17 and 9 mm HG There is mild perivallvular regurgitation.. The aortic valve has been repaired/replaced. Procedure Date: 06/21/2023. 6. Pulmonic valve regurgitation is moderate. 7. The inferior vena cava is normal in size with greater than 50% respiratory variability, suggesting right atrial pressure of 3 mmHg.  FINDINGS Left Ventricle: Left ventricular ejection fraction, by estimation, is 65 to 70%. The left ventricle has normal function. The left ventricle has no regional wall motion abnormalities. The left ventricular internal cavity size was normal in size. There is mild left ventricular hypertrophy. Left ventricular diastolic parameters are consistent with Grade III diastolic dysfunction (restrictive). Elevated left atrial pressure.  Right Ventricle: The right ventricular size is normal.  Right vetricular wall thickness was not assessed. Right ventricular systolic function is normal. There is mildly elevated pulmonary artery systolic pressure. The tricuspid regurgitant velocity is 3.21 m/s, and with an assumed right atrial pressure of 3 mmHg, the estimated right ventricular systolic pressure is 44.2 mmHg.  Left Atrium: Left atrial size was moderately dilated.  Right Atrium: Right atrial size was normal in size.  Pericardium: There is no evidence of pericardial effusion.  Mitral Valve: The mitral valve is normal in structure. Mild mitral valve regurgitation.  Tricuspid Valve: The tricuspid valve is normal in structure. Tricuspid valve regurgitation is mild.  Aortic Valve: S/p TAVR (23 mm Edwards S3UR; procedure date 06/21/23) Peak and mean gradients through the valve are 17 and 9 mm HG There is mild perivallvular regurgitation. The aortic valve has been repaired/replaced. Aortic valve mean gradient measures 9.0 mmHg. Aortic valve peak gradient measures 16.8 mmHg. Aortic valve area, by VTI measures 1.74 cm. There is a 20 mm Edwards Sapien 3 Ultra Resilia valve present in the aortic position.  Pulmonic Valve: The pulmonic valve was not well visualized. Pulmonic valve regurgitation is moderate.  Aorta: The aortic root and ascending aorta are structurally normal, with no evidence of dilitation.  Venous: The inferior vena cava is normal in size with greater than 50% respiratory variability, suggesting right atrial pressure of 3 mmHg.  IAS/Shunts: No atrial level shunt detected by color flow Doppler.   LEFT VENTRICLE PLAX 2D LVIDd:         3.60 cm   Diastology LVIDs:         2.30 cm   LV e' medial:    3.30 cm/s LV PW:         1.00 cm   LV E/e' medial:  49.4 LV IVS:        1.20 cm   LV e' lateral:   5.50 cm/s LVOT diam:     2.00 cm   LV E/e' lateral: 29.6 LV SV:         88 LV SV Index:   56 LVOT Area:     3.14 cm  3D Volume EF: 3D EF:        69 % LV EDV:       131 ml LV  ESV:       41 ml LV SV:  90 ml  RIGHT VENTRICLE RV S prime:     6.73 cm/s TAPSE (M-mode): 1.3 cm  LEFT ATRIUM           Index        RIGHT ATRIUM           Index LA diam:      3.70 cm 2.36 cm/m   RA Area:     11.20 cm LA Vol (A4C): 69.1 ml 44.14 ml/m  RA Volume:   20.30 ml  12.97 ml/m AORTIC VALVE AV Area (Vmax):    1.55 cm AV Area (Vmean):   1.59 cm AV Area (VTI):     1.74 cm AV Vmax:           205.00 cm/s AV Vmean:          139.000 cm/s AV VTI:            0.506 m AV Peak Grad:      16.8 mmHg AV Mean Grad:      9.0 mmHg LVOT Vmax:         101.00 cm/s LVOT Vmean:        70.400 cm/s LVOT VTI:          0.281 m LVOT/AV VTI ratio: 0.56  AORTA Ao Root diam: 3.00 cm Ao Asc diam:  3.80 cm  MITRAL VALVE                TRICUSPID VALVE MV Area (PHT): 2.95 cm     TR Peak grad:   41.2 mmHg MV Decel Time: 257 msec     TR Vmax:        321.00 cm/s MV E velocity: 163.00 cm/s MV A velocity: 44.90 cm/s   SHUNTS MV E/A ratio:  3.63         Systemic VTI:  0.28 m Systemic Diam: 2.00 cm  Dietrich Pates MD Electronically signed by Dietrich Pates MD Signature Date/Time: 07/25/2023/6:33:17 PM    Final    CT SCANS  CT CORONARY MORPH W/CTA COR W/SCORE 03/08/2023  Addendum 03/08/2023  1:17 PM ADDENDUM REPORT: 03/08/2023 13:14  CLINICAL DATA:  Aortic stenosis  EXAM: Cardiac TAVR CT  TECHNIQUE: The patient was scanned on a Siemens Force 192 slice scanner. A 120 kV retrospective scan was triggered in the descending thoracic aorta at 111 HU's. Gantry rotation speed was 270 msecs and collimation was .9 mm. No beta blockade or nitro were given. The 3D data set was reconstructed in 5% intervals of the R-R cycle. Systolic and diastolic phases were analyzed on a dedicated work station using MPR, MIP and VRT modes. The patient received 80 cc of contrast.  FINDINGS: Aortic Valve: Functionally bicuspid with fused right and left cusp AV with calcium score 874  Aorta: No aneurysm normal  arch vessels moderate calcific atherosclerosis  Sinotubular Junction: 26.4 mm  Ascending Thoracic Aorta: 34 mm  Aortic Arch: 25 mm  Descending Thoracic Aorta: 24 mm  Sinus of Valsalva Measurements:  Non-coronary: 31.4 mm  Height 23.1 mm  Right - coronary: 28.6 mm  Height 16.2 mm  Left - coronary: 30.8 mm  Height 18.4 mm  Coronary Artery Height above Annulus:  Left Main: 13.9 mm above annulus  Right Coronary: 9.3 mm above annulus  Virtual Basal Annulus Measurements:  Maximum/Minimum Diameter: 23.3 mm x 18.6 mm Average diameter 21.2 mm  Perimeter: 67.7 mm  Area: 353 mm2  Coronary Arteries: Sufficient height above annulus for deployment and patient has by pass grafts  Optimum Fluoroscopic Angle for Delivery: LAO 7 Caudal 6 degrees  IMPRESSION: 1. Functionally bicuspid AV with fused right and left cusp score 874  2. Annular area of 353 mm2 suitable for a 23 mm Sapien 3 valve Alternatively a 26 Medtronic Evolut valve would fit  3.  Optimum angiographic angle for deployment LAO 7 Caudal 6 degrees  4.  Coronary arteries sufficient height above annulus for deployment  5.  There are 2 patent SVGls and a patent LIMA  6.  Membranous septal length 9.5 mm  Charlton Haws   Electronically Signed By: Charlton Haws M.D. On: 03/08/2023 13:14  Narrative EXAM: OVER-READ INTERPRETATION  CT CHEST  The following report is a limited chest CT over-read performed by radiologist Dr. Allegra Lai of [facility] on 03/08/2023. This over-read does not include interpretation of cardiac or coronary anatomy or pathology. The cardiac TAVR interpretation by the cardiologist is attached.  COMPARISON:  None Available.  FINDINGS: Extracardiac findings will be described separately under dictation for contemporaneously obtained CTA chest, abdomen and pelvis.  IMPRESSION: Please see separate dictation for contemporaneously obtained CTA chest, abdomen and pelvis dated  03/08/2023 for full description of relevant extracardiac findings.  Electronically Signed: By: Allegra Lai M.D. On: 03/08/2023 11:53          EKG:        Recent Labs: 06/02/2023: B Natriuretic Peptide 224.4; TSH 2.471 06/17/2023: ALT 20 06/26/2023: BUN 12; Creatinine, Ser 0.64; Hemoglobin 10.9; Magnesium 2.0; Platelets 168; Potassium 3.7; Sodium 133  Recent Lipid Panel    Component Value Date/Time   CHOL 132 02/02/2023 1523   CHOL 134 07/28/2018 1153   TRIG 77.0 02/02/2023 1523   HDL 55.40 02/02/2023 1523   HDL 52 07/28/2018 1153   CHOLHDL 2 02/02/2023 1523   VLDL 15.4 02/02/2023 1523   LDLCALC 61 02/02/2023 1523   LDLCALC 71 07/30/2020 1631            Physical Exam:    VS:  BP 110/64   Pulse 75   Ht 5\' 3"  (1.6 m)   Wt 121 lb 9.6 oz (55.2 kg)   SpO2 97%   BMI 21.54 kg/m     Wt Readings from Last 3 Encounters:  12/01/23 121 lb 9.6 oz (55.2 kg)  09/19/23 127 lb (57.6 kg)  09/19/23 127 lb 11.2 oz (57.9 kg)     GEN: Pleasant elderly woman in no acute distress HEENT: Normal NECK: No JVD; No carotid bruits LYMPHATICS: No lymphadenopathy CARDIAC: RRR, 2/6 mid peaking systolic murmur best heard at the left upper sternal border RESPIRATORY:  Clear to auscultation without rales, wheezing or rhonchi  ABDOMEN: Soft, non-tender, non-distended MUSCULOSKELETAL: Trace right ankle and pretibial edema, none on the left; No deformity  SKIN: Warm and dry NEUROLOGIC:  Alert and oriented x 3 PSYCHIATRIC:  Normal affect   Assessment & Plan S/P TAVR (transcatheter aortic valve replacement) The patient's last echocardiogram demonstrated normal LVEF and normal function of her TAVR bioprosthesis with a mean gradient of 9 mmHg and mild paravalvular regurgitation.  I will see her back in 3 to 4 months and she will have a 1 year TAVR echo per protocol in August 2025. Coronary artery disease involving native coronary artery of native heart with angina pectoris (HCC) Continue  aspirin, atorvastatin, and metoprolol. Chronic diastolic heart failure (HCC) Appears clinically stable.  Stop furosemide due to bladder incontinence.  Continue metoprolol and losartan.  Use furosemide as needed increased edema or weight gain. Essential  hypertension Blood pressure well-controlled      Medication Adjustments/Labs and Tests Ordered: Current medicines are reviewed at length with the patient today.  Concerns regarding medicines are outlined above.  No orders of the defined types were placed in this encounter.  No orders of the defined types were placed in this encounter.   Patient Instructions  Medication Instructions:  STOP Furosemide *If you need a refill on your cardiac medications before your next appointment, please call your pharmacy*  Follow-Up: At [facility], you and your health needs are our priority.  As part of our continuing mission to provide you with exceptional heart care, we have created designated Provider Care Teams.  These Care Teams include your primary Cardiologist (physician) and Advanced Practice Providers (APPs -  Physician Assistants and Nurse Practitioners) who all work together to provide you with the care you need, when you need it.  We recommend signing up for the patient portal called "MyChart".  Sign up information is provided on this After Visit Summary.  MyChart is used to connect with patients for Virtual Visits (Telemedicine).  Patients are able to view lab/test results, encounter notes, upcoming appointments, etc.  Non-urgent messages can be sent to your provider as well.   To learn more about what you can do with MyChart, go to [email or web address].    Your next appointment:   4 month(s)  Provider:   Tonny Bollman, MD      1st Floor: - Lobby - Registration  - Pharmacy  - Lab - Cafe  2nd Floor: - PV Lab - Diagnostic Testing (echo, CT, nuclear med)  3rd Floor: - Vacant  4th Floor: - TCTS (cardiothoracic  surgery) - AFib Clinic - Structural Heart Clinic - Vascular Surgery  - Vascular Ultrasound  5th Floor: - HeartCare Cardiology (general and EP) - Clinical Pharmacy for coumadin, hypertension, lipid, weight-loss medications, and med management appointments    Valet parking services will be available as well.          Signed, Tonny Bollman, MD  12/01/2023 5:21 PM    [facility]

## 2023-12-01 NOTE — Assessment & Plan Note (Signed)
Appears clinically stable.  Stop furosemide due to bladder incontinence.  Continue metoprolol and losartan.  Use furosemide as needed increased edema or weight gain.

## 2023-12-01 NOTE — Assessment & Plan Note (Signed)
Continue aspirin, atorvastatin, and metoprolol.

## 2023-12-26 ENCOUNTER — Other Ambulatory Visit: Payer: Self-pay | Admitting: Internal Medicine

## 2023-12-30 DIAGNOSIS — N3944 Nocturnal enuresis: Secondary | ICD-10-CM | POA: Diagnosis not present

## 2024-01-23 ENCOUNTER — Ambulatory Visit (INDEPENDENT_AMBULATORY_CARE_PROVIDER_SITE_OTHER): Payer: Medicare Other

## 2024-01-23 VITALS — Ht 63.0 in | Wt 118.0 lb

## 2024-01-23 DIAGNOSIS — Z0000 Encounter for general adult medical examination without abnormal findings: Secondary | ICD-10-CM

## 2024-01-23 DIAGNOSIS — Z780 Asymptomatic menopausal state: Secondary | ICD-10-CM | POA: Diagnosis not present

## 2024-01-23 NOTE — Progress Notes (Signed)
 Subjective:   [name] is a 83 y.o. who presents for a Medicare Wellness preventive visit.  Visit Complete: Virtual I connected with  [name] on 01/23/24 by a audio enabled telemedicine application and verified that I am speaking with the correct person using two identifiers.  Patient Location: Home  Provider Location: Home Office  I discussed the limitations of evaluation and management by telemedicine. The patient expressed understanding and agreed to proceed.  Vital Signs: Because this visit was a virtual/telehealth visit, some criteria may be missing or patient reported. Any vitals not documented were not able to be obtained and vitals that have been documented are patient reported.  VideoDeclined- This patient declined Librarian, academic. Therefore the visit was completed with audio only.  AWV Questionnaire: No: Patient Medicare AWV questionnaire was not completed prior to this visit.  Cardiac Risk Factors include: hypertension;advanced age (>48men, >72 women);Other (see comment);dyslipidemia, Risk factor comments: CAD, CHF, History of CVA (cerebrovascular accident) ,Carotid artery disease     Objective:    Today's Vitals   01/23/24 1452  Weight: 118 lb (53.5 kg)  Height: 5\' 3"  (1.6 m)   Body mass index is 20.9 kg/m.     01/23/2024    3:14 PM 06/21/2023   10:00 AM 06/02/2023    9:46 AM 10/28/2022    8:37 AM 09/27/2022    8:16 AM 09/02/2022    2:15 PM 12/10/2021    8:49 PM  Advanced Directives  Does Patient Have a Medical Advance Directive? Yes No No Yes Yes Yes No  Type of Estate agent of [place];Living will   Healthcare Power of [place];Living will;Out of facility DNR (pink MOST or yellow form) Healthcare Power of [employer] of [place];Living will   Does patient want to make changes to medical advance directive?     No - Patient declined    Copy of Healthcare Power of Attorney in Chart? No -  copy requested     No - copy requested   Would patient like information on creating a medical advance directive?  No - Patient declined     No - Patient declined    Current Medications (verified) Outpatient Encounter Medications as of 01/23/2024  Medication Sig   aspirin 81 MG tablet Take 81 mg by mouth daily.     atorvastatin (LIPITOR) 20 MG tablet TAKE 1 TABLET BY MOUTH EVERY DAY   Calcium Carbonate (CALTRATE 600 PO) Take 1 tablet by mouth 2 (two) times daily.     citalopram (CELEXA) 20 MG tablet TAKE 1 TABLET BY MOUTH EVERY DAY   co-enzyme Q-10 30 MG capsule Take 30 mg by mouth daily.   famotidine (PEPCID) 40 MG tablet TAKE 1 TABLET BY MOUTH EVERY DAY   fluticasone (FLONASE) 50 MCG/ACT nasal spray Place 1 spray into both nostrils daily as needed for allergies or rhinitis.   levothyroxine (SYNTHROID) 75 MCG tablet TAKE 1 TABLET BY MOUTH EVERY MORNING BEFORE BREAKFAST   losartan (COZAAR) 50 MG tablet TAKE 1 TABLET BY MOUTH EVERY DAY   meloxicam (MOBIC) 7.5 MG tablet TAKE 1/2 TO 1 TABLET EVERY DAY   metoprolol tartrate (LOPRESSOR) 25 MG tablet TAKE 1 TABLET BY MOUTH TWICE A DAY   Multiple Vitamins-Minerals (MULTIVITAMIN) LIQD Take 1 tablet by mouth daily.   Multiple Vitamins-Minerals (PRESERVISION AREDS 2) CAPS Take 1 capsule by mouth 2 (two) times daily.   senna-docusate (SENOKOT-S) 8.6-50 MG per tablet Take 1 tablet by mouth daily.  VITAMIN D, CHOLECALCIFEROL, PO Take 1 tablet by mouth daily.   zolpidem (AMBIEN) 5 MG tablet TAKE 1 TABLET (5 MG TOTAL) BY MOUTH EVERY DAY AT BEDTIME AS NEEDED FOR SLEEP   doxycycline (VIBRAMYCIN) 100 MG capsule Take 1 capsule (100 mg total) by mouth as needed (Take two tablets by mouth one hour prior to dental appointments). (Patient not taking: Reported on 01/23/2024)   furosemide (LASIX) 20 MG tablet TAKE 1 TABLET BY MOUTH EVERY DAY (Patient not taking: Reported on 01/23/2024)   mirabegron ER (MYRBETRIQ) 50 MG TB24 tablet Take 50 mg by mouth daily. (Patient  not taking: Reported on 01/23/2024)   No facility-administered encounter medications on file as of 01/23/2024.    Allergies (verified) Clarithromycin, Clindamycin, Codeine, Levofloxacin, Morphine, Naproxen, Omeprazole, Penicillins, and Pneumococcal vaccines   History: Past Medical History:  Diagnosis Date   Allergy    SEASONAL   Anxiety    BREAST CANCER 1991   left, 1991; B mastectomy   CAD (coronary artery disease)    s/p CABG x5 in 2007   Cataract    BILATERAL-REMOVED   Depression    DIVERTICULOSIS    GERD    History of DVT (deep vein thrombosis)    HYPERLIPIDEMIA    Hypertension    HYPOTHYROIDISM    Occlusion and stenosis of carotid artery without mention of cerebral infarction    OSTEOPOROSIS    S/P TAVR (transcatheter aortic valve replacement) 06/21/2023   s/p TAVR with a 23mm Edwards S3UR via the TF approach by Dr. Clifton James & Dr. Leafy Ro   Severe aortic stenosis    Past Surgical History:  Procedure Laterality Date   APPENDECTOMY     COLONOSCOPY     CORONARY ARTERY BYPASS GRAFT     5        2007   ENDARTERECTOMY FEMORAL Right 06/22/2023   Procedure: RIGHT FEMORAL ENDARTERECTOMY WITH PATCH ANGIOPLASTY;  Surgeon: Nada Libman, MD;  Location: [facility] OR;  Service: Vascular;  Laterality: Right;   FEMORAL-POPLITEAL BYPASS GRAFT Right 06/22/2023   Procedure: FEMORAL THROMBECTOMY;  Surgeon: Nada Libman, MD;  Location: [facility] OR;  Service: Vascular;  Laterality: Right;   INTRAOPERATIVE TRANSTHORACIC ECHOCARDIOGRAM N/A 06/21/2023   Procedure: INTRAOPERATIVE TRANSTHORACIC ECHOCARDIOGRAM;  Surgeon: Kathleene Hazel, MD;  Location: [facility];  Service: Open Heart Surgery;  Laterality: N/A;   MASTECTOMY     left with reconstruction and lymph node excision  1992   MASTECTOMY     right with reconstruction   PATCH ANGIOPLASTY Right 06/22/2023   Procedure: PATCH ANGIOPLASTY OF RIGHT FEMORAL ARTERY USING BOVINE PERICARDIAL PATCH;  Surgeon: Nada Libman, MD;  Location:  [facility] OR;  Service: Vascular;  Laterality: Right;   REVISION RECONSTRUCTED BREAST Left 02/2014   willard (plastics in [place])   right ankle arthroscopy     RIGHT/LEFT HEART CATH AND CORONARY ANGIOGRAPHY N/A 06/09/2023   Procedure: RIGHT/LEFT HEART CATH AND CORONARY ANGIOGRAPHY;  Surgeon: Kathleene Hazel, MD;  [facility]: [facility];  Service: Cardiovascular;  Laterality: N/A;   TEAR DUCT CANAL     TONSILLECTOMY AND ADENOIDECTOMY     TRANSCATHETER AORTIC VALVE REPLACEMENT, TRANSFEMORAL N/A 06/21/2023   Procedure: Transcatheter Aortic Valve Replacement, Transfemoral;  Surgeon: Kathleene Hazel, MD;  [facility];  Service: Open Heart Surgery;  Laterality: N/A;   UPPER GASTROINTESTINAL ENDOSCOPY     VAGINAL HYSTERECTOMY     ovaries not excised   Family History  Problem Relation Age of  Onset   Colon cancer Sister    Colon cancer Sister    Breast cancer Mother    Lung disease Mother    Emphysema Father    Esophageal cancer Neg Hx    Rectal cancer Neg Hx    Stomach cancer Neg Hx    Thyroid disease Neg Hx    Social History   Socioeconomic History   Marital status: Divorced    Spouse name: Not on file   Number of children: 2   Years of education: Not on file   Highest education level: Not on file  Occupational History   Occupation: retired  Tobacco Use   Smoking status: Never   Smokeless tobacco: Never  Vaping Use   Vaping status: Never Used  Substance and Sexual Activity   Alcohol use: No    Alcohol/week: 0.0 standard drinks of alcohol   Drug use: No   Sexual activity: Not Currently  Other Topics Concern   Not on file  Social History Narrative   Divorced x 3, lives alone   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, school psychologist   Right handed    Social Drivers of Health   Financial Resource Strain: Low Risk  (01/23/2024)   Overall Financial Resource Strain (CARDIA)    Difficulty of Paying Living Expenses: Not hard at all  Food  Insecurity: No Food Insecurity (01/23/2024)   Hunger Vital Sign    Worried About Running Out of Food in the Last Year: Never true    Ran Out of Food in the Last Year: Never true  Transportation Needs: No Transportation Needs (01/23/2024)   PRAPARE - Administrator, Civil Service (Medical): No    Lack of Transportation (Non-Medical): No  Physical Activity: Inactive (01/23/2024)   Exercise Vital Sign    Days of Exercise per Week: 0 days    Minutes of Exercise per Session: 0 min  Stress: Stress Concern Present (01/23/2024)   [employer] of Occupational Health - Occupational Stress Questionnaire    Feeling of Stress : To some extent  Social Connections: Moderately Integrated (01/23/2024)   Social Connection and Isolation Panel [NHANES]    Frequency of Communication with Friends and Family: More than three times a week    Frequency of Social Gatherings with Friends and Family: More than three times a week    Attends Religious Services: More than 4 times per year    Active Member of [employer] or Organizations: Yes    Attends Banker Meetings: Never    Marital Status: Divorced    Tobacco Counseling Counseling given: Not Answered    Clinical Intake:  Pre-visit preparation completed: Yes  Pain : No/denies pain     BMI - recorded: 20.9 Nutritional Status: BMI of 19-24  Normal Nutritional Risks: None  How often do you need to have someone help you when you read instructions, pamphlets, or other written materials from your doctor or pharmacy?: 1 - Never  Interpreter Needed?: No  Information entered by :: Hoa Briggs, RMA   Activities of Daily Living     01/23/2024    2:53 PM 06/21/2023   10:10 AM  In your present state of health, do you have any difficulty performing the following activities:  Hearing? 1   Comment wears hearing aides   Vision? 0   Difficulty concentrating or making decisions? 0   Walking or climbing stairs? 0   Dressing or bathing?  0   Doing errands,  shopping? 0 0  Preparing Food and eating ? N   Using the Toilet? N   In the past six months, have you accidently leaked urine? N   Do you have problems with loss of bowel control? Y   Managing your Medications? N   Managing your Finances? N   Housekeeping or managing your Housekeeping? N     Patient Care Team: Pincus Sanes, MD as PCP - General (Internal Medicine) Tonny Bollman, MD as PCP - Cardiology (Cardiology) Tonny Bollman, MD as Consulting Physician (Cardiology) Waymon Budge, MD as Consulting Physician (Pulmonary Disease) Hilarie Fredrickson, MD as Consulting Physician (Gastroenterology) Arminda Resides, MD as Consulting Physician (Dermatology) Jodi Geralds, MD (Plastic Surgery) Genia Del, MD (Obstetrics and Gynecology) Sinda Du, MD as Consulting Physician (Ophthalmology) Van Clines, MD as Consulting Physician (Neurology)  Indicate any recent Medical Services you may have received from other than [facility] providers in the past year (date may be approximate).     Assessment:   This is a routine wellness examination for Analysa.  Hearing/Vision screen Hearing Screening - Comments:: Wears hearing aides Vision Screening - Comments:: Wears eyeglasses   Goals Addressed             This Visit's Progress    Patient Stated   On track    Continue to be socially active and enjoy my friends, volunteer, stay active in the church. Take time for me to do relaxing things like read, garden and do my devotions.        Depression Screen     01/23/2024    3:22 PM 09/08/2023    4:37 PM 02/02/2023    2:32 PM 08/04/2022    2:12 PM 08/31/2021    5:23 PM 08/27/2020    3:43 PM 07/30/2019    1:19 PM  PHQ 2/9 Scores  PHQ - 2 Score 0 0 0 0 0 0 0  PHQ- 9 Score 0 2         Fall Risk     01/23/2024    3:15 PM 11/14/2023    2:26 PM 11/11/2023    3:11 PM 11/04/2023    2:16 PM 11/02/2023    2:33 PM  Fall Risk   Falls in the past year? 0 0 0 0  0  Number falls in past yr: 0 0 0 0 0  Injury with Fall? 0 0 0 0 0  Risk for fall due to : No Fall Risks Impaired balance/gait;Impaired mobility;Orthopedic patient Impaired balance/gait;Impaired mobility;Orthopedic patient Impaired balance/gait;Impaired mobility;Orthopedic patient Impaired balance/gait;Impaired mobility;Orthopedic patient  Follow up Falls prevention discussed;Falls evaluation completed Falls evaluation completed Falls evaluation completed Falls evaluation completed Falls evaluation completed    MEDICARE RISK AT HOME:  Medicare Risk at Home Any stairs in or around the home?: Yes If so, are there any without handrails?: Yes Home free of loose throw rugs in walkways, pet beds, electrical cords, etc?: Yes Adequate lighting in your home to reduce risk of falls?: Yes Life alert?: No Use of a cane, walker or w/c?: Yes Grab bars in the bathroom?: Yes Shower chair or bench in shower?: No Elevated toilet seat or a handicapped toilet?: No  TIMED UP AND GO:  Was the test performed?  No  Cognitive Function: 6CIT completed    07/19/2018   12:44 PM 07/13/2017    4:15 PM  MMSE - Mini Mental State Exam  Orientation to time 5 5  Orientation to Place 5 5  Registration 3  3  Attention/ Calculation 5 5  Recall 1 2  Language- name 2 objects 2 2  Language- repeat 1 1  Language- follow 3 step command 3 3  Language- read & follow direction 1 1  Write a sentence 1 1  Copy design 1 1  Total score 28 29      10/28/2022    8:00 AM  Montreal Cognitive Assessment   Visuospatial/ Executive (0/5) 2  Naming (0/3) 2  Attention: Read list of digits (0/2) 2  Attention: Read list of letters (0/1) 1  Attention: Serial 7 subtraction starting at 100 (0/3) 1  Language: Repeat phrase (0/2) 1  Language : Fluency (0/1) 0  Abstraction (0/2) 1  Delayed Recall (0/5) 3  Orientation (0/6) 6  Total 19  Adjusted Score (based on education) 19      01/23/2024    2:54 PM 09/02/2022    2:41 PM  08/27/2020    3:48 PM  6CIT Screen  What Year? 0 points 0 points 0 points  What month? 0 points 0 points 0 points  What time? 0 points 0 points 0 points  Count back from 20 0 points 0 points 0 points  Months in reverse 0 points 0 points 0 points  Repeat phrase 0 points 0 points 0 points  Total Score 0 points 0 points 0 points    Immunizations Immunization History  Administered Date(s) Administered   Fluad Quad(high Dose 65+) 07/25/2019, 07/30/2020, 08/04/2021, 08/04/2022   Fluad Trivalent(High Dose 65+) 07/27/2023   H1N1 11/13/2008   Influenza Split 08/10/2011, 08/28/2012   Influenza Whole 08/05/2008, 08/18/2009, 08/05/2010   Influenza, High Dose Seasonal PF 07/13/2017, 07/19/2018   Influenza,inj,Quad PF,6+ Mos 08/28/2013, 12/11/2014, 07/30/2015, 07/21/2016   Moderna Covid-19 Fall Seasonal Vaccine 76yrs & older 09/14/2023   PFIZER(Purple Top)SARS-COV-2 Vaccination 12/05/2019, 12/26/2019, 08/19/2020   PNEUMOCOCCAL CONJUGATE-20 08/04/2022   Pfizer Covid-19 Vaccine Bivalent Booster 42yrs & up 08/11/2021   Pneumococcal Conjugate-13 09/05/2013, 05/29/2015   Pneumococcal-Unspecified 09/05/2013   Tetanus 05/30/2014   Zoster Recombinant(Shingrix) 03/08/2018, 05/08/2018   Zoster, Live 10/06/2013    Screening Tests Health Maintenance  Topic Date Due   DTaP/Tdap/Td (1 - Tdap) 05/31/2014   DEXA SCAN  10/03/2023   Medicare Annual Wellness (AWV)  01/22/2025   Pneumonia Vaccine 23+ Years old  Completed   INFLUENZA VACCINE  Completed   COVID-19 Vaccine  Completed   Zoster Vaccines- Shingrix  Completed   HPV VACCINES  Aged Out    Health Maintenance  Health Maintenance Due  Topic Date Due   DTaP/Tdap/Td (1 - Tdap) 05/31/2014   DEXA SCAN  10/03/2023   Health Maintenance Items Addressed: DEXA ordered, See Nurse Notes  Additional Screening:  Vision Screening: Recommended annual ophthalmology exams for early detection of glaucoma and other disorders of the eye.  Dental  Screening: Recommended annual dental exams for proper oral hygiene  Community Resource Referral / Chronic Care Management: CRR required this visit?  No   CCM required this visit?  No     Plan:     I have personally reviewed and noted the following in the patient's chart:   Medical and social history Use of alcohol, tobacco or illicit drugs  Current medications and supplements including opioid prescriptions. Patient is not currently taking opioid prescriptions. Functional ability and status Nutritional status Physical activity Advanced directives List of other physicians Hospitalizations, surgeries, and ER visits in previous 12 months Vitals Screenings to include cognitive, depression, and falls Referrals and appointments  In addition,  I have reviewed and discussed with patient certain preventive protocols, quality metrics, and best practice recommendations. A written personalized care plan for preventive services as well as general preventive health recommendations were provided to patient.     Brittnie Lewey L Rakeem Colley, CMA   01/23/2024   After Visit Summary: (Mail) Due to this being a telephonic visit, the after visit summary with patients personalized plan was offered to patient via mail   Notes: Please refer to Routing Comments.

## 2024-01-23 NOTE — Patient Instructions (Signed)
 [name] , Thank you for taking time to come for your Medicare Wellness Visit. I appreciate your ongoing commitment to your health goals. Please review the following plan we discussed and let me know if I can assist you in the future.   Referrals/Orders/Follow-Ups/Clinician Recommendations: It was nice talking to you today.    This is a list of the screening recommended for you and due dates:  Health Maintenance  Topic Date Due   DTaP/Tdap/Td vaccine (1 - Tdap) 05/31/2014   DEXA scan (bone density measurement)  10/03/2023   Medicare Annual Wellness Visit  01/22/2025   Pneumonia Vaccine  Completed   Flu Shot  Completed   COVID-19 Vaccine  Completed   Zoster (Shingles) Vaccine  Completed   HPV Vaccine  Aged Out    Advanced directives: (Copy Requested) Please bring a copy of your health care power of attorney and living will to the office to be added to your chart at your convenience. You can mail to [facility] [address] [place], [place] [address] or email to [email or web address]  Next Medicare Annual Wellness Visit scheduled for next year: Yes

## 2024-01-24 NOTE — Patient Instructions (Addendum)
      Blood work was ordered.       Medications changes include :   try pepcid every other day and see how your symptoms are    A referral was ordered Physical Therapy at [facility] and someone will call you to schedule an appointment.     Return in about 6 months (around 07/27/2024) for follow up.

## 2024-01-24 NOTE — Progress Notes (Unsigned)
 Subjective:    Patient ID: [name], female    DOB: [birth year], 83 y.o.   MRN: [id number]     HPI Alleen is here for follow up of her chronic medical problems.  Suffering from incontinence - seeing urology -they have not been able to find anything that helps.  She was offered posterior tibial nerve simulation, but this would be expensive  Has no energy.  Did cardiac rehab for 10 weeks.    Her balance is poor.  No recent falls.  She is using a cane.  Does not want to cook - eats lean cuisine, sandwiches  Medications and allergies reviewed with patient and updated if appropriate.  Current Outpatient Medications on File Prior to Visit  Medication Sig Dispense Refill   aspirin 81 MG tablet Take 81 mg by mouth daily.       atorvastatin (LIPITOR) 20 MG tablet TAKE 1 TABLET BY MOUTH EVERY DAY 90 tablet 1   Calcium Carbonate (CALTRATE 600 PO) Take 1 tablet by mouth 2 (two) times daily.       citalopram (CELEXA) 20 MG tablet TAKE 1 TABLET BY MOUTH EVERY DAY 90 tablet 2   co-enzyme Q-10 30 MG capsule Take 30 mg by mouth daily.     famotidine (PEPCID) 40 MG tablet TAKE 1 TABLET BY MOUTH EVERY DAY 90 tablet 1   fluticasone (FLONASE) 50 MCG/ACT nasal spray Place 1 spray into both nostrils daily as needed for allergies or rhinitis. 9.9 mL 5   levothyroxine (SYNTHROID) 75 MCG tablet TAKE 1 TABLET BY MOUTH EVERY MORNING BEFORE BREAKFAST 90 tablet 1   losartan (COZAAR) 50 MG tablet TAKE 1 TABLET BY MOUTH EVERY DAY 90 tablet 3   meloxicam (MOBIC) 7.5 MG tablet TAKE 1/2 TO 1 TABLET EVERY DAY 90 tablet 0   metoprolol tartrate (LOPRESSOR) 25 MG tablet TAKE 1 TABLET BY MOUTH TWICE A DAY 180 tablet 1   Multiple Vitamins-Minerals (MULTIVITAMIN) LIQD Take 1 tablet by mouth daily.     Multiple Vitamins-Minerals (PRESERVISION AREDS 2) CAPS Take 1 capsule by mouth 2 (two) times daily.     senna-docusate (SENOKOT-S) 8.6-50 MG per tablet Take 1 tablet by mouth daily.       VITAMIN D,  CHOLECALCIFEROL, PO Take 1 tablet by mouth daily.     zolpidem (AMBIEN) 5 MG tablet TAKE 1 TABLET (5 MG TOTAL) BY MOUTH EVERY DAY AT BEDTIME AS NEEDED FOR SLEEP 30 tablet 5   doxycycline (VIBRAMYCIN) 100 MG capsule Take 1 capsule (100 mg total) by mouth as needed (Take two tablets by mouth one hour prior to dental appointments). (Patient not taking: Reported on 01/25/2024) 10 capsule 12   furosemide (LASIX) 20 MG tablet TAKE 1 TABLET BY MOUTH EVERY DAY (Patient not taking: Reported on 01/25/2024) 90 tablet 3   mirabegron ER (MYRBETRIQ) 50 MG TB24 tablet Take 50 mg by mouth daily. (Patient not taking: Reported on 01/25/2024)     No current facility-administered medications on file prior to visit.     Review of Systems  Constitutional:  Negative for fever.  Respiratory:  Positive for shortness of breath (mild with moderate exertion). Negative for cough and wheezing.   Cardiovascular:  Negative for chest pain, palpitations and leg swelling.  Genitourinary:        Urinary incontinence  Neurological:  Negative for light-headedness and headaches.  Psychiatric/Behavioral:  Negative for sleep disturbance.        Objective:   Vitals:  01/25/24 1424  BP: 116/66  Pulse: 60  Temp: 98 F (36.7 C)  SpO2: 98%   BP Readings from Last 3 Encounters:  01/25/24 116/66  12/01/23 110/64  09/19/23 (!) 148/64   Wt Readings from Last 3 Encounters:  01/25/24 121 lb (54.9 kg)  01/23/24 118 lb (53.5 kg)  12/01/23 121 lb 9.6 oz (55.2 kg)   Body mass index is 21.43 kg/m.    Physical Exam Constitutional:      General: She is not in acute distress.    Appearance: Normal appearance.  HENT:     Head: Normocephalic and atraumatic.  Eyes:     Conjunctiva/sclera: Conjunctivae normal.  Cardiovascular:     Rate and Rhythm: Normal rate and regular rhythm.     Heart sounds: Normal heart sounds.  Pulmonary:     Effort: Pulmonary effort is normal. No respiratory distress.     Breath sounds: Normal  breath sounds. No wheezing.  Musculoskeletal:     Cervical back: Neck supple.     Right lower leg: No edema.     Left lower leg: No edema.  Lymphadenopathy:     Cervical: No cervical adenopathy.  Skin:    General: Skin is warm and dry.     Findings: No rash.  Neurological:     Mental Status: She is alert. Mental status is at baseline.  Psychiatric:        Mood and Affect: Mood normal.        Behavior: Behavior normal.        Lab Results  Component Value Date   WBC 8.4 06/26/2023   HGB 10.9 (L) 06/26/2023   HCT 33.0 (L) 06/26/2023   PLT 168 06/26/2023   GLUCOSE 88 06/26/2023   CHOL 132 02/02/2023   TRIG 77.0 02/02/2023   HDL 55.40 02/02/2023   LDLCALC 61 02/02/2023   ALT 20 06/17/2023   AST 27 06/17/2023   NA 133 (L) 06/26/2023   K 3.7 06/26/2023   CL 98 06/26/2023   CREATININE 0.64 06/26/2023   BUN 12 06/26/2023   CO2 26 06/26/2023   TSH 2.471 06/02/2023   INR 1.1 06/17/2023   HGBA1C 5.9 02/02/2023     Assessment & Plan:    See Problem List for Assessment and Plan of chronic medical problems.

## 2024-01-25 ENCOUNTER — Ambulatory Visit: Payer: Medicare Other | Admitting: Internal Medicine

## 2024-01-25 ENCOUNTER — Encounter: Payer: Self-pay | Admitting: Internal Medicine

## 2024-01-25 VITALS — BP 116/66 | HR 60 | Temp 98.0°F | Ht 63.0 in | Wt 121.0 lb

## 2024-01-25 DIAGNOSIS — R7303 Prediabetes: Secondary | ICD-10-CM | POA: Diagnosis not present

## 2024-01-25 DIAGNOSIS — J4520 Mild intermittent asthma, uncomplicated: Secondary | ICD-10-CM

## 2024-01-25 DIAGNOSIS — I10 Essential (primary) hypertension: Secondary | ICD-10-CM

## 2024-01-25 DIAGNOSIS — K219 Gastro-esophageal reflux disease without esophagitis: Secondary | ICD-10-CM

## 2024-01-25 DIAGNOSIS — G4709 Other insomnia: Secondary | ICD-10-CM | POA: Diagnosis not present

## 2024-01-25 DIAGNOSIS — F419 Anxiety disorder, unspecified: Secondary | ICD-10-CM

## 2024-01-25 DIAGNOSIS — R5381 Other malaise: Secondary | ICD-10-CM | POA: Insufficient documentation

## 2024-01-25 DIAGNOSIS — F32A Depression, unspecified: Secondary | ICD-10-CM

## 2024-01-25 DIAGNOSIS — E7849 Other hyperlipidemia: Secondary | ICD-10-CM | POA: Diagnosis not present

## 2024-01-25 DIAGNOSIS — I5032 Chronic diastolic (congestive) heart failure: Secondary | ICD-10-CM | POA: Diagnosis not present

## 2024-01-25 DIAGNOSIS — E039 Hypothyroidism, unspecified: Secondary | ICD-10-CM | POA: Diagnosis not present

## 2024-01-25 DIAGNOSIS — Z8673 Personal history of transient ischemic attack (TIA), and cerebral infarction without residual deficits: Secondary | ICD-10-CM | POA: Diagnosis not present

## 2024-01-25 DIAGNOSIS — I2510 Atherosclerotic heart disease of native coronary artery without angina pectoris: Secondary | ICD-10-CM | POA: Diagnosis not present

## 2024-01-25 DIAGNOSIS — R2689 Other abnormalities of gait and mobility: Secondary | ICD-10-CM

## 2024-01-25 LAB — COMPREHENSIVE METABOLIC PANEL
ALT: 27 U/L (ref 0–35)
AST: 32 U/L (ref 0–37)
Albumin: 4.1 g/dL (ref 3.5–5.2)
Alkaline Phosphatase: 75 U/L (ref 39–117)
BUN: 16 mg/dL (ref 6–23)
CO2: 31 meq/L (ref 19–32)
Calcium: 9.5 mg/dL (ref 8.4–10.5)
Chloride: 100 meq/L (ref 96–112)
Creatinine, Ser: 0.66 mg/dL (ref 0.40–1.20)
GFR: 80.21 mL/min (ref >60.00)
Glucose, Bld: 92 mg/dL (ref 70–99)
Potassium: 4.3 meq/L (ref 3.5–5.1)
Sodium: 135 meq/L (ref 135–145)
Total Bilirubin: 0.8 mg/dL (ref 0.2–1.2)
Total Protein: 6.6 g/dL (ref 6.0–8.3)

## 2024-01-25 LAB — CBC WITH DIFFERENTIAL/PLATELET
Basophils Absolute: 0.1 10*3/uL (ref 0.0–0.1)
Basophils Relative: 0.9 % (ref 0.0–3.0)
Eosinophils Absolute: 0.3 10*3/uL (ref 0.0–0.7)
Eosinophils Relative: 5.3 % — ABNORMAL HIGH (ref 0.0–5.0)
HCT: 37.1 % (ref 36.0–46.0)
Hemoglobin: 12.2 g/dL (ref 12.0–15.0)
Lymphocytes Relative: 30.4 % (ref 12.0–46.0)
Lymphs Abs: 1.8 10*3/uL (ref 0.7–4.0)
MCHC: 33 g/dL (ref 30.0–36.0)
MCV: 87.4 fl (ref 78.0–100.0)
Monocytes Absolute: 0.6 10*3/uL (ref 0.1–1.0)
Monocytes Relative: 9.9 % (ref 3.0–12.0)
Neutro Abs: 3.2 10*3/uL (ref 1.4–7.7)
Neutrophils Relative %: 53.5 % (ref 43.0–77.0)
Platelets: 246 10*3/uL (ref 150.0–400.0)
RBC: 4.25 Mil/uL (ref 3.87–5.11)
RDW: 16.2 % — ABNORMAL HIGH (ref 11.5–15.5)
WBC: 6 10*3/uL (ref 4.0–10.5)

## 2024-01-25 LAB — LIPID PANEL
Cholesterol: 122 mg/dL (ref 0–200)
HDL: 54.7 mg/dL (ref >39.00)
LDL Cholesterol: 57 mg/dL (ref 0–99)
NonHDL: 67.4
Total CHOL/HDL Ratio: 2
Triglycerides: 52 mg/dL (ref 0.0–149.0)
VLDL: 10.4 mg/dL (ref 0.0–40.0)

## 2024-01-25 LAB — TSH: TSH: 0.8 u[IU]/mL (ref 0.35–5.50)

## 2024-01-25 LAB — HEMOGLOBIN A1C: Hgb A1c MFr Bld: 6 % (ref 4.6–6.5)

## 2024-01-25 NOTE — Assessment & Plan Note (Signed)
Chronic Referral to PT

## 2024-01-25 NOTE — Assessment & Plan Note (Signed)
 Chronic Lab Results  Component Value Date   HGBA1C 5.9 02/02/2023   Check a1c Low sugar / carb diet Stressed regular exercise

## 2024-01-25 NOTE — Assessment & Plan Note (Signed)
Chronic Anxiety is controlled Continue citalopram to 20 mg daily

## 2024-01-25 NOTE — Assessment & Plan Note (Signed)
 Chronic Continue asa 81 mg, atorvastatin20 mg

## 2024-01-25 NOTE — Assessment & Plan Note (Signed)
 Chronic Following with cardiology Continue aspirin 81 mg daily, atorvastatin 20 mg daily Blood pressure, sugar controlled

## 2024-01-25 NOTE — Assessment & Plan Note (Signed)
 Chronic Blood pressure is controlled CBC, CMP Continue losartan 50 mg daily, metoprolol 25 mg twice daily

## 2024-01-25 NOTE — Assessment & Plan Note (Addendum)
 Chronic Following with Dr [name]  Grade 2 DD Appears euvolemic Has not taken any furosemide

## 2024-01-25 NOTE — Assessment & Plan Note (Addendum)
 Chronic Encouraged trying to increase her activity Healthy diet Check lipid panel Continue atorvastatin 20 mg daily

## 2024-01-25 NOTE — Assessment & Plan Note (Signed)
 Chronic Controlled, stable Continue ambien 2.5 mg nightly prn

## 2024-01-25 NOTE — Assessment & Plan Note (Signed)
 Chronic Mild, intermittent Controlled

## 2024-01-25 NOTE — Assessment & Plan Note (Addendum)
 Chronic GERD controlled Continue Pepcid 40 mg daily Can consider trying to take the medication every other day and see how she does with that

## 2024-01-25 NOTE — Assessment & Plan Note (Signed)
Chronic  Clinically euthyroid Last TSH within normal range Currently taking levothyroxine 75 mcg daily

## 2024-01-30 NOTE — Therapy (Signed)
 OUTPATIENT PHYSICAL THERAPY NEURO EVALUATION   Patient Name: [name] MRN: [id number] DOB:[birth year], 83 y.o., female Today's Date: 01/30/2024   PCP: Pincus Sanes, MD  REFERRING PROVIDER: Pincus Sanes, MD   END OF SESSION:   Past Medical History:  Diagnosis Date   Allergy    SEASONAL   Anxiety    BREAST CANCER 1991   left, 1991; B mastectomy   CAD (coronary artery disease)    s/p CABG x5 in 2007   Cataract    BILATERAL-REMOVED   Depression    DIVERTICULOSIS    GERD    History of DVT (deep vein thrombosis)    HYPERLIPIDEMIA    Hypertension    HYPOTHYROIDISM    Occlusion and stenosis of carotid artery without mention of cerebral infarction    OSTEOPOROSIS    S/P TAVR (transcatheter aortic valve replacement) 06/21/2023   s/p TAVR with a 23mm Edwards S3UR via the TF approach by Dr. Clifton James & Dr. Leafy Ro   Severe aortic stenosis    Past Surgical History:  Procedure Laterality Date   APPENDECTOMY     COLONOSCOPY     CORONARY ARTERY BYPASS GRAFT     5        2007   ENDARTERECTOMY FEMORAL Right 06/22/2023   Procedure: RIGHT FEMORAL ENDARTERECTOMY WITH PATCH ANGIOPLASTY;  Surgeon: Nada Libman, MD;  Location: [facility] OR;  Service: Vascular;  Laterality: Right;   FEMORAL-POPLITEAL BYPASS GRAFT Right 06/22/2023   Procedure: FEMORAL THROMBECTOMY;  Surgeon: Nada Libman, MD;  Location: [facility] OR;  Service: Vascular;  Laterality: Right;   INTRAOPERATIVE TRANSTHORACIC ECHOCARDIOGRAM N/A 06/21/2023   Procedure: INTRAOPERATIVE TRANSTHORACIC ECHOCARDIOGRAM;  Surgeon: Kathleene Hazel, MD;  Location: [facility];  Service: Open Heart Surgery;  Laterality: N/A;   MASTECTOMY     left with reconstruction and lymph node excision  1992   MASTECTOMY     right with reconstruction   PATCH ANGIOPLASTY Right 06/22/2023   Procedure: PATCH ANGIOPLASTY OF RIGHT FEMORAL ARTERY USING BOVINE PERICARDIAL PATCH;  Surgeon: Nada Libman, MD;  Location: [facility] OR;  Service: Vascular;   Laterality: Right;   REVISION RECONSTRUCTED BREAST Left 02/2014   willard (plastics in [place])   right ankle arthroscopy     RIGHT/LEFT HEART CATH AND CORONARY ANGIOGRAPHY N/A 06/09/2023   Procedure: RIGHT/LEFT HEART CATH AND CORONARY ANGIOGRAPHY;  Surgeon: Kathleene Hazel, MD;  Location: [facility];  Service: Cardiovascular;  Laterality: N/A;   TEAR DUCT CANAL     TONSILLECTOMY AND ADENOIDECTOMY     TRANSCATHETER AORTIC VALVE REPLACEMENT, TRANSFEMORAL N/A 06/21/2023   Procedure: Transcatheter Aortic Valve Replacement, Transfemoral;  Surgeon: Kathleene Hazel, MD;  Location: [facility];  Service: Open Heart Surgery;  Laterality: N/A;   UPPER GASTROINTESTINAL ENDOSCOPY     VAGINAL HYSTERECTOMY     ovaries not excised   Patient Active Problem List   Diagnosis Date Noted   Physical deconditioning 01/25/2024   Abnormal ultrasound 07/27/2023   Lung nodule seen on imaging study 07/27/2023   Fatigue 07/27/2023   Constipation 06/26/2023   S/P TAVR (transcatheter aortic valve replacement) 06/21/2023   CAD (coronary artery disease)    History of CVA (cerebrovascular accident) 10/27/2022   Bilateral sensorineural hearing loss 09/22/2022   Chronic diastolic heart failure (HCC) 08/04/2022   Prediabetes 08/04/2022   Acute left-sided low back pain without sciatica 07/30/2020   Cavus deformity of foot 05/01/2020   Poor balance 05/10/2018   Left  ventricular diastolic dysfunction 07/27/2017   Essential hypertension 07/27/2017   Fall 03/02/2017   Urinary incontinence 02/16/2017   Chronic venous insufficiency 08/20/2016   Xiphoid pain 03/05/2016   Insomnia 01/16/2016   Anxiety and depression 12/24/2015   Asthma, mild intermittent, well-controlled 11/24/2015   Carotid artery disease (HCC) 06/29/2011   Hypothyroidism 11/11/2010   Hyperlipidemia 11/11/2010   Senile osteoporosis 11/11/2010   RHINOSINUSITIS, RECURRENT 10/29/2008   Personal history of malignant neoplasm of  breast 03/27/2008   GERD (gastroesophageal reflux disease) 03/27/2008   DIVERTICULOSIS 03/27/2008   Atherosclerosis of native coronary artery of native heart with angina pectoris (HCC) 08/23/2007   Severe aortic stenosis 08/23/2007   DVT 08/23/2007    ONSET DATE: August 2024  REFERRING DIAG: R26.89 (ICD-10-CM) - Poor balance R53.81 (ICD-10-CM) - Physical deconditioning  THERAPY DIAG:  No diagnosis found.  Rationale for Evaluation and Treatment: Rehabilitation  SUBJECTIVE:                                                                                                                                                                                             SUBJECTIVE STATEMENT: Had cardiac rehab s/p TAVR in August until December 2024. Reports "I'm so tired" and trouble with balance. Reports swollen and black LEs in the AM when she wakes up d/t vascular issues. Not back to walking d/t fear of falling.    Pt accompanied by: self  PERTINENT HISTORY: Breast CA s/p B mastectomy, CAD, cataract, depression, GERD, HLD, HTN, osteoporosis, CABG 2007, femoral-popliteal bypass graft 2024  PAIN:  Are you having pain? No  PRECAUTIONS: Fall and Other: osteoporosis  RED FLAGS: None   WEIGHT BEARING RESTRICTIONS: No  FALLS: Has patient fallen in last 6 months? 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  PATIENT GOALS: work on endurance and improve balance   OBJECTIVE:  Note: Objective measures were completed at Evaluation unless otherwise noted.  Vitals: 98% spO2, 64bpm; 134/64 mmHg  DIAGNOSTIC FINDINGS: none recent  COGNITION: Overall cognitive status: Within functional limits for tasks assessed   SENSATION: Reports reduced sensation in B feet  POSTURE: rounded shoulders,  forward head, and increased thoracic kyphosis  LOWER EXTREMITY MMT:    MMT (in sitting) Right Eval Left Eval  Hip flexion 3 3+  Hip extension    Hip abduction 3+ 3+  Hip adduction 3+ 3+  Hip internal rotation    Hip external rotation    Knee  flexion 3+ 4-  Knee extension 3+ 4-  Ankle dorsiflexion 3 3  Ankle plantarflexion 2+ 2+  Ankle inversion    Ankle eversion    (Blank rows = not tested)  GAIT: Gait pattern: Very slow, short B step length, occasionally tripping on cane Assistive device utilized: Single point cane Level of assistance: CGA   FUNCTIONAL TESTS:  5 times sit to stand: 47.01 sec using B UEs  94% spO2 10 meter walk test: 43.89 sec with SPC  0.75 ft/sec                                                                                                                               TREATMENT DATE: 01/31/24    PATIENT EDUCATION: Education details: prognosis, POC, HEP  Person educated: Patient Education method: Explanation, Demonstration, Tactile cues, Verbal cues, and Handouts Education comprehension: verbalized understanding and returned demonstration  HOME EXERCISE PROGRAM: Access Code: [email or web address] URL: [email or web address] Date: 01/31/2024 Prepared by: [facility] - Outpatient  Rehab - [facility]  Exercises - Sit to Stand with Counter Support  - 1 x daily - 5 x weekly - 2-3 sets - 5 reps  GOALS: Goals reviewed with patient? Yes  SHORT TERM GOALS: Target date: 02/21/2024  Patient to be independent with initial HEP. Baseline: HEP initiated Goal status: INITIAL    LONG TERM GOALS: Target date: 03/13/2024  Patient to be independent with advanced HEP. Baseline: Not yet initiated  Goal status: INITIAL  Patient to demonstrate B LE strength >/=4/5.  Baseline: See above Goal status: INITIAL  Patient to demonstrate gait speed of at least 1.5 ft/sec in order to improve access to community.  Baseline: 0.75 ft/sec Goal status:  INITIAL  Patient to demonstrate 5xSTS test in <25 sec in order to decrease risk of falls.  Baseline: 47.01 sec using B UEs Goal status: INITIAL  Patient to score at least 46/56 on Berg in order to decrease risk of falls.  Baseline: NT Goal status: INITIAL  Patient to report plans to participate in home or community exercise program to maintain fitness.  Baseline: not currently participating  Goal status: INITIAL  ASSESSMENT:  CLINICAL IMPRESSION:  Patient is an 83 y/o F presenting to OPPT with c/o fatigue and imbalance s/p TAVR surgery in August 2024 despite participating in cardiac rehab. Patient today presenting with Rounded posture, marked B LE weakness, decreased gait speed, difficulty with transfers, and gait deviations. Patient fatigued quickly with testing done today but vitals were well-maintained throughout. Patient was educated on gentle STS HEP and reported understanding.  Would benefit from skilled PT services 2 x/week for 6 weeks to address aforementioned impairments in order to optimize level of function.    OBJECTIVE IMPAIRMENTS: Abnormal gait, decreased activity tolerance, decreased balance, decreased endurance, decreased knowledge of use of DME, decreased strength, decreased safety awareness, and postural dysfunction.   ACTIVITY LIMITATIONS: carrying, lifting, bending, standing, squatting, stairs, transfers, bathing, toileting, dressing, reach over head,  hygiene/grooming, and locomotion level  PARTICIPATION LIMITATIONS: meal prep, cleaning, laundry, driving, shopping, community activity, and church  PERSONAL FACTORS: Age, Fitness, Past/current experiences, Time since onset of injury/illness/exacerbation, and 3+ comorbidities: Breast CA s/p B mastectomy, CAD, cataract, depression, GERD, HLD, HTN, osteoporosis, CABG 2007, femoral-popliteal bypass graft 2024  are also affecting patient's functional outcome.   REHAB POTENTIAL: Good  CLINICAL DECISION MAKING:  Evolving/moderate complexity  EVALUATION COMPLEXITY: Moderate  PLAN:  PT FREQUENCY: 2x/week  PT DURATION: 6 weeks  PLANNED INTERVENTIONS: [id number]- PT Re-evaluation, 97110-Therapeutic exercises, 97530- Therapeutic activity, 97112- Neuromuscular re-education, 97535- Self Care, 16109- Manual therapy, [phone]- Gait training, [phone]- Canalith repositioning, Patient/Family education, Balance training, Stair training, Taping, Dry Needling, Joint mobilization, Spinal mobilization, Vestibular training, Cryotherapy, and Moist heat  PLAN FOR NEXT SESSION: Berg, update HEP with endurance training and balance activities that she can perform safely. May want to edu pt on monitoring her vitals at home and staying within safe HR range    Baldemar Friday, , DPT 01/31/24 4:29 PM  [facility] at [facility] [address] [place], [address] [place], [place] [address] Phone # [phone] Fax # [phone]

## 2024-01-31 ENCOUNTER — Ambulatory Visit: Attending: Internal Medicine | Admitting: Physical Therapy

## 2024-01-31 ENCOUNTER — Other Ambulatory Visit: Payer: Self-pay

## 2024-01-31 ENCOUNTER — Encounter: Payer: Self-pay | Admitting: Physical Therapy

## 2024-01-31 DIAGNOSIS — R262 Difficulty in walking, not elsewhere classified: Secondary | ICD-10-CM | POA: Diagnosis not present

## 2024-01-31 DIAGNOSIS — R29898 Other symptoms and signs involving the musculoskeletal system: Secondary | ICD-10-CM | POA: Diagnosis not present

## 2024-01-31 DIAGNOSIS — R2689 Other abnormalities of gait and mobility: Secondary | ICD-10-CM | POA: Insufficient documentation

## 2024-01-31 DIAGNOSIS — R5381 Other malaise: Secondary | ICD-10-CM | POA: Diagnosis not present

## 2024-01-31 DIAGNOSIS — M6281 Muscle weakness (generalized): Secondary | ICD-10-CM

## 2024-01-31 DIAGNOSIS — R2681 Unsteadiness on feet: Secondary | ICD-10-CM

## 2024-02-02 ENCOUNTER — Telehealth: Payer: Self-pay

## 2024-02-02 ENCOUNTER — Other Ambulatory Visit: Payer: Self-pay

## 2024-02-02 DIAGNOSIS — M810 Age-related osteoporosis without current pathological fracture: Secondary | ICD-10-CM

## 2024-02-02 NOTE — Telephone Encounter (Signed)
 Copied from CRM [id number]. Topic: Clinical - Request for Lab/Test Order >> Feb 02, 2024  2:13 PM [name] wrote:  Reason for CRM: Pt called in stating that [facility] ph# [phone], has not yet received the order for her test which is scheduled for tomorrow around 2p. Pt says it may have been sent to the wrong place. Please follow up with patient.

## 2024-02-03 DIAGNOSIS — Z853 Personal history of malignant neoplasm of breast: Secondary | ICD-10-CM | POA: Diagnosis not present

## 2024-02-03 DIAGNOSIS — R29890 Loss of height: Secondary | ICD-10-CM | POA: Diagnosis not present

## 2024-02-03 DIAGNOSIS — M8588 Other specified disorders of bone density and structure, other site: Secondary | ICD-10-CM | POA: Diagnosis not present

## 2024-02-03 LAB — HM DEXA SCAN

## 2024-02-03 NOTE — Telephone Encounter (Signed)
 Message left for patient this morning that order was faxed yesterday.

## 2024-02-03 NOTE — Telephone Encounter (Signed)
 Order was faxed yesterday.  Patient should be good to go.

## 2024-02-06 ENCOUNTER — Ambulatory Visit

## 2024-02-06 DIAGNOSIS — M6281 Muscle weakness (generalized): Secondary | ICD-10-CM

## 2024-02-06 DIAGNOSIS — R29898 Other symptoms and signs involving the musculoskeletal system: Secondary | ICD-10-CM

## 2024-02-06 DIAGNOSIS — R5381 Other malaise: Secondary | ICD-10-CM | POA: Diagnosis not present

## 2024-02-06 DIAGNOSIS — R2681 Unsteadiness on feet: Secondary | ICD-10-CM | POA: Diagnosis not present

## 2024-02-06 DIAGNOSIS — R262 Difficulty in walking, not elsewhere classified: Secondary | ICD-10-CM | POA: Diagnosis not present

## 2024-02-06 DIAGNOSIS — R2689 Other abnormalities of gait and mobility: Secondary | ICD-10-CM | POA: Diagnosis not present

## 2024-02-06 NOTE — Therapy (Signed)
 OUTPATIENT PHYSICAL THERAPY NEURO TREATMENT   Patient Name: [name] MRN: [id number] DOB:24-Jul-1941, 83 y.o., female Today's Date: 02/06/2024   PCP: Pincus Sanes, MD  REFERRING PROVIDER: Pincus Sanes, MD   END OF SESSION:  PT End of Session - 02/06/24 1618     Visit Number 2    Number of Visits 13    Date for PT Re-Evaluation 03/13/24    Authorization Type Medicare/BCBS    Authorization - Number of Visits 60    PT Start Time 1615    PT Stop Time 1700    PT Time Calculation (min) 45 min    Activity Tolerance Patient tolerated treatment well;Patient limited by fatigue    Behavior During Therapy [facility] for tasks assessed/performed             Past Medical History:  Diagnosis Date   Allergy    SEASONAL   Anxiety    BREAST CANCER 1991   left, 1991; B mastectomy   CAD (coronary artery disease)    s/p CABG x5 in 2007   Cataract    BILATERAL-REMOVED   Depression    DIVERTICULOSIS    GERD    History of DVT (deep vein thrombosis)    HYPERLIPIDEMIA    Hypertension    HYPOTHYROIDISM    Occlusion and stenosis of carotid artery without mention of cerebral infarction    OSTEOPOROSIS    S/P TAVR (transcatheter aortic valve replacement) 06/21/2023   s/p TAVR with a 23mm Edwards S3UR via the TF approach by Dr. Clifton James & Dr. Leafy Ro   Severe aortic stenosis    Past Surgical History:  Procedure Laterality Date   APPENDECTOMY     COLONOSCOPY     CORONARY ARTERY BYPASS GRAFT     5        2007   ENDARTERECTOMY FEMORAL Right 06/22/2023   Procedure: RIGHT FEMORAL ENDARTERECTOMY WITH PATCH ANGIOPLASTY;  Surgeon: Nada Libman, MD;  Location: [facility] OR;  Service: Vascular;  Laterality: Right;   FEMORAL-POPLITEAL BYPASS GRAFT Right 06/22/2023   Procedure: FEMORAL THROMBECTOMY;  Surgeon: Nada Libman, MD;  Location: [facility] OR;  Service: Vascular;  Laterality: Right;   INTRAOPERATIVE TRANSTHORACIC ECHOCARDIOGRAM N/A 06/21/2023   Procedure: INTRAOPERATIVE TRANSTHORACIC  ECHOCARDIOGRAM;  Surgeon: Kathleene Hazel, MD;  Location: [facility] INVASIVE CV LAB;  Service: Open Heart Surgery;  Laterality: N/A;   MASTECTOMY     left with reconstruction and lymph node excision  1992   MASTECTOMY     right with reconstruction   PATCH ANGIOPLASTY Right 06/22/2023   Procedure: PATCH ANGIOPLASTY OF RIGHT FEMORAL ARTERY USING BOVINE PERICARDIAL PATCH;  Surgeon: Nada Libman, MD;  Location: [facility] OR;  Service: Vascular;  Laterality: Right;   REVISION RECONSTRUCTED BREAST Left 02/2014   willard (plastics in [place])   right ankle arthroscopy     RIGHT/LEFT HEART CATH AND CORONARY ANGIOGRAPHY N/A 06/09/2023   Procedure: RIGHT/LEFT HEART CATH AND CORONARY ANGIOGRAPHY;  Surgeon: Kathleene Hazel, MD;  Location: [facility] INVASIVE CV LAB;  Service: Cardiovascular;  Laterality: N/A;   TEAR DUCT CANAL     TONSILLECTOMY AND ADENOIDECTOMY     TRANSCATHETER AORTIC VALVE REPLACEMENT, TRANSFEMORAL N/A 06/21/2023   Procedure: Transcatheter Aortic Valve Replacement, Transfemoral;  Surgeon: Kathleene Hazel, MD;  Location: [facility] INVASIVE CV LAB;  Service: Open Heart Surgery;  Laterality: N/A;   UPPER GASTROINTESTINAL ENDOSCOPY     VAGINAL HYSTERECTOMY     ovaries not excised   Patient Active Problem List  Diagnosis Date Noted   Physical deconditioning 01/25/2024   Abnormal ultrasound 07/27/2023   Lung nodule seen on imaging study 07/27/2023   Fatigue 07/27/2023   Constipation 06/26/2023   S/P TAVR (transcatheter aortic valve replacement) 06/21/2023   CAD (coronary artery disease)    History of CVA (cerebrovascular accident) 10/27/2022   Bilateral sensorineural hearing loss 09/22/2022   Chronic diastolic heart failure (HCC) 08/04/2022   Prediabetes 08/04/2022   Acute left-sided low back pain without sciatica 07/30/2020   Cavus deformity of foot 05/01/2020   Poor balance 05/10/2018   Left ventricular diastolic dysfunction 07/27/2017   Essential hypertension 07/27/2017   Fall  03/02/2017   Urinary incontinence 02/16/2017   Chronic venous insufficiency 08/20/2016   Xiphoid pain 03/05/2016   Insomnia 01/16/2016   Anxiety and depression 12/24/2015   Asthma, mild intermittent, well-controlled 11/24/2015   Carotid artery disease (HCC) 06/29/2011   Hypothyroidism 11/11/2010   Hyperlipidemia 11/11/2010   Senile osteoporosis 11/11/2010   RHINOSINUSITIS, RECURRENT 10/29/2008   Personal history of malignant neoplasm of breast 03/27/2008   GERD (gastroesophageal reflux disease) 03/27/2008   DIVERTICULOSIS 03/27/2008   Atherosclerosis of native coronary artery of native heart with angina pectoris (HCC) 08/23/2007   Severe aortic stenosis 08/23/2007   DVT 08/23/2007    ONSET DATE: August 2024  REFERRING DIAG: R26.89 (ICD-10-CM) - Poor balance R53.81 (ICD-10-CM) - Physical deconditioning  THERAPY DIAG:  Muscle weakness (generalized)  Difficulty in walking, not elsewhere classified  Unsteadiness on feet  Other symptoms and signs involving the musculoskeletal system  Rationale for Evaluation and Treatment: Rehabilitation  SUBJECTIVE:                                                                                                                                                                                             SUBJECTIVE STATEMENT: Nothing new to report.  "Just trying to live".  Left knee was painful when walking down steps earlier.   Pt accompanied by: self  PERTINENT HISTORY: Breast CA s/p B mastectomy, CAD, cataract, depression, GERD, HLD, HTN, osteoporosis, CABG 2007, femoral-popliteal bypass graft 2024  PAIN:  Are you having pain? No  PRECAUTIONS: Fall and Other: osteoporosis  RED FLAGS: None   WEIGHT BEARING RESTRICTIONS: No  FALLS: Has patient fallen in last 6 months? No  LIVING ENVIRONMENT: Lives with: lives alone Lives in: Other townhome Stairs: 7 steps to enter with railing Has following equipment at home: Single point cane,  Environmental consultant - 2 wheeled, and Grab bars  PLOF: Independent; monthly cleaning service  PATIENT GOALS: work on endurance and improve balance   OBJECTIVE:   TODAY'S TREATMENT: 02/06/24 Activity  Comments  Berg Balance Test 41/56  Sit to stand 1x5    Sidestep x 2 min Along counter  Static balance At counter: feet together EO/EC x 30 sec. Head turns EO/EC x 30 sec  Calf raise 2x10   Pt education regarding stocking butler for compression stockings donning     [facility] PT Assessment - 02/06/24 0001       Standardized Balance Assessment   Standardized Balance Assessment Berg Balance Test      Berg Balance Test   Sit to Stand Able to stand without using hands and stabilize independently    Standing Unsupported Able to stand safely 2 minutes    Sitting with Back Unsupported but Feet Supported on Floor or Stool Able to sit safely and securely 2 minutes    Stand to Sit Sits safely with minimal use of hands    Transfers Able to transfer safely, minor use of hands    Standing Unsupported with Eyes Closed Able to stand 10 seconds with supervision    Standing Unsupported with Feet Together Able to place feet together independently and stand for 1 minute with supervision    From Standing, Reach Forward with Outstretched Arm Can reach forward >12 cm safely (5")    From Standing Position, Pick up Object from Floor Able to pick up shoe, needs supervision    From Standing Position, Turn to Look Behind Over each Shoulder Turn sideways only but maintains balance    Turn 360 Degrees Needs close supervision or verbal cueing    Standing Unsupported, Alternately Place Feet on Step/Stool Able to complete 4 steps without aid or supervision    Standing Unsupported, One Foot in Front Able to take small step independently and hold 30 seconds    Standing on One Leg Able to lift leg independently and hold equal to or more than 3 seconds    Total Score 41              PATIENT EDUCATION: Education details: prognosis,  POC, HEP  Person educated: Patient Education method: Explanation, Demonstration, Tactile cues, Verbal cues, and Handouts Education comprehension: verbalized understanding and returned demonstration  HOME EXERCISE PROGRAM: Access Code: [email or web address] URL: [email or web address] Date: 01/31/2024 Prepared by: [facility] - Outpatient  Rehab - [facility]  Exercises - Sit to Stand with Counter Support  - 1 x daily - 5 x weekly - 2-3 sets - 5 reps - Heel Raises with Counter Support  - 1 x daily - 7 x weekly - 3 sets - 10 reps - Side Stepping with Counter Support  - 1 x daily - 7 x weekly - 3 sets - 1-2 min rounds hold - Feet Together Balance at [employer] Eyes Closed  - 1 x daily - 7 x weekly - 3 sets - 30 sec hold  Note: Objective measures were completed at Evaluation unless otherwise noted.  Vitals: 98% spO2, 64bpm; 134/64 mmHg  DIAGNOSTIC FINDINGS: none recent  COGNITION: Overall cognitive status: Within functional limits for tasks assessed   SENSATION: Reports reduced sensation in B feet  POSTURE: rounded shoulders, forward head, and increased thoracic kyphosis  LOWER EXTREMITY MMT:    MMT (in sitting) Right Eval Left Eval  Hip flexion 3 3+  Hip extension    Hip abduction 3+ 3+  Hip adduction 3+ 3+  Hip internal rotation    Hip external rotation    Knee flexion 3+ 4-  Knee extension 3+ 4-  Ankle dorsiflexion 3 3  Ankle plantarflexion  2+ 2+  Ankle inversion    Ankle eversion    (Blank rows = not tested)  GAIT: Gait pattern: Very slow, short B step length, occasionally tripping on cane Assistive device utilized: Single point cane Level of assistance: CGA   FUNCTIONAL TESTS:  5 times sit to stand: 47.01 sec using B UEs  94% spO2 10 meter walk test: 43.89 sec with SPC  0.75 ft/sec                                                                                                                               TREATMENT DATE: 01/31/24       GOALS: Goals reviewed with patient? Yes  SHORT TERM GOALS: Target date: 02/21/2024  Patient to be independent with initial HEP. Baseline: HEP initiated Goal status: INITIAL    LONG TERM GOALS: Target date: 03/13/2024  Patient to be independent with advanced HEP. Baseline: Not yet initiated  Goal status: INITIAL  Patient to demonstrate B LE strength >/=4/5.  Baseline: See above Goal status: INITIAL  Patient to demonstrate gait speed of at least 1.5 ft/sec in order to improve access to community.  Baseline: 0.75 ft/sec Goal status: INITIAL  Patient to demonstrate 5xSTS test in <25 sec in order to decrease risk of falls.  Baseline: 47.01 sec using B UEs Goal status: INITIAL  Patient to score at least 46/56 on Berg in order to decrease risk of falls.  Baseline: 41/56 Goal status: INITIAL  Patient to report plans to participate in home or community exercise program to maintain fitness.  Baseline: not currently participating  Goal status: INITIAL  ASSESSMENT:  CLINICAL IMPRESSION: Berg Balance Test score 41/56 indicating high risk for falls. Difficulty with narrow BOS and single limb support. Good return demo of initial HEP for chair squats and progressed for activities to improve single limb support and multisensory balance and ankle strengthening to improve LE circulation for muscle pump regarding edema.  Continued sessions indicated to progress POC details to improve mobility and reduce risk for falls   OBJECTIVE IMPAIRMENTS: Abnormal gait, decreased activity tolerance, decreased balance, decreased endurance, decreased knowledge of use of DME, decreased strength, decreased safety awareness, and postural dysfunction.   ACTIVITY LIMITATIONS: carrying, lifting, bending, standing, squatting, stairs, transfers, bathing, toileting, dressing, reach over head, hygiene/grooming, and locomotion level  PARTICIPATION LIMITATIONS: meal prep, cleaning, laundry, driving, shopping,  community activity, and church  PERSONAL FACTORS: Age, Fitness, Past/current experiences, Time since onset of injury/illness/exacerbation, and 3+ comorbidities: Breast CA s/p B mastectomy, CAD, cataract, depression, GERD, HLD, HTN, osteoporosis, CABG 2007, femoral-popliteal bypass graft 2024  are also affecting patient's functional outcome.   REHAB POTENTIAL: Good  CLINICAL DECISION MAKING: Evolving/moderate complexity  EVALUATION COMPLEXITY: Moderate  PLAN:  PT FREQUENCY: 2x/week  PT DURATION: 6 weeks  PLANNED INTERVENTIONS: 97164- PT Re-evaluation, 97110-Therapeutic exercises, 97530- Therapeutic activity, [id number]- Neuromuscular re-education, 97535- Self Care, 16109- Manual therapy, [id number]- Gait training, [phone]- Canalith repositioning, Patient/Family education, Balance  training, Stair training, Taping, Dry Needling, Joint mobilization, Spinal mobilization, Vestibular training, Cryotherapy, and Moist heat  PLAN FOR NEXT SESSION: review HEP with endurance training and balance activities that she can perform safely. May want to edu pt on monitoring her vitals at home and staying within safe HR range    5:11 PM, 02/06/24 M. Shary Decamp, PT, DPT Physical Therapist- [place] Office Number: [phone]

## 2024-02-09 ENCOUNTER — Ambulatory Visit

## 2024-02-09 ENCOUNTER — Other Ambulatory Visit: Payer: Self-pay | Admitting: Cardiovascular Disease

## 2024-02-09 DIAGNOSIS — R2681 Unsteadiness on feet: Secondary | ICD-10-CM

## 2024-02-09 DIAGNOSIS — M6281 Muscle weakness (generalized): Secondary | ICD-10-CM | POA: Diagnosis not present

## 2024-02-09 DIAGNOSIS — R262 Difficulty in walking, not elsewhere classified: Secondary | ICD-10-CM | POA: Diagnosis not present

## 2024-02-09 DIAGNOSIS — R29898 Other symptoms and signs involving the musculoskeletal system: Secondary | ICD-10-CM | POA: Diagnosis not present

## 2024-02-09 DIAGNOSIS — R5381 Other malaise: Secondary | ICD-10-CM | POA: Diagnosis not present

## 2024-02-09 DIAGNOSIS — R2689 Other abnormalities of gait and mobility: Secondary | ICD-10-CM | POA: Diagnosis not present

## 2024-02-09 NOTE — Therapy (Signed)
 OUTPATIENT PHYSICAL THERAPY NEURO TREATMENT   Patient Name: [name] MRN: [id number] DOB:06/15/1941, 83 y.o., female Today's Date: 02/09/2024   PCP: Pincus Sanes, MD  REFERRING PROVIDER: Pincus Sanes, MD   END OF SESSION:  PT End of Session - 02/09/24 1620     Visit Number 3    Number of Visits 13    Date for PT Re-Evaluation 03/13/24    Authorization Type Medicare/BCBS    Authorization - Number of Visits 60    PT Start Time 1615    PT Stop Time 1700    PT Time Calculation (min) 45 min    Activity Tolerance Patient tolerated treatment well;Patient limited by fatigue    Behavior During Therapy [facility] for tasks assessed/performed             Past Medical History:  Diagnosis Date   Allergy    SEASONAL   Anxiety    BREAST CANCER 1991   left, 1991; B mastectomy   CAD (coronary artery disease)    s/p CABG x5 in 2007   Cataract    BILATERAL-REMOVED   Depression    DIVERTICULOSIS    GERD    History of DVT (deep vein thrombosis)    HYPERLIPIDEMIA    Hypertension    HYPOTHYROIDISM    Occlusion and stenosis of carotid artery without mention of cerebral infarction    OSTEOPOROSIS    S/P TAVR (transcatheter aortic valve replacement) 06/21/2023   s/p TAVR with a 23mm Edwards S3UR via the TF approach by Dr. Clifton James & Dr. Leafy Ro   Severe aortic stenosis    Past Surgical History:  Procedure Laterality Date   APPENDECTOMY     COLONOSCOPY     CORONARY ARTERY BYPASS GRAFT     5        2007   ENDARTERECTOMY FEMORAL Right 06/22/2023   Procedure: RIGHT FEMORAL ENDARTERECTOMY WITH PATCH ANGIOPLASTY;  Surgeon: Nada Libman, MD;  [facility]: [facility] OR;  Service: Vascular;  Laterality: Right;   FEMORAL-POPLITEAL BYPASS GRAFT Right 06/22/2023   Procedure: FEMORAL THROMBECTOMY;  Surgeon: Nada Libman, MD;  [facility]: [facility] OR;  Service: Vascular;  Laterality: Right;   INTRAOPERATIVE TRANSTHORACIC ECHOCARDIOGRAM N/A 06/21/2023   Procedure: INTRAOPERATIVE TRANSTHORACIC  ECHOCARDIOGRAM;  Surgeon: Kathleene Hazel, MD;  [facility] INVAS[facility] CV LAB;  Service: Open Heart Surgery;  Laterality: N/A;   MASTECTOMY     left with reconstruction and lymph node excision  1992   MASTECTOMY     right with reconstruction   PATCH ANGIOPLASTY Right 06/22/2023   Procedure: PATCH ANGIOPLASTY OF RIGHT FEMORAL ARTERY USING BOVINE PERICARDIAL PATCH;  Surgeon: Nada Libman, MD;  [facility];  Service: Vascular;  Laterality: Right;   REVISION RECONSTRUCTED BREAST Left 02/2014   willard (plastics in [place])   right ankle arthroscopy     RIGHT/LEFT HEART CATH AND CORONARY ANGIOGRAPHY N/A 06/09/2023   Procedure: RIGHT/LEFT HEART CATH AND CORONARY ANGIOGRAPHY;  Surgeon: Kathleene Hazel, MD;  [facility];  Service: Cardiovascular;  Laterality: N/A;   TEAR DUCT CANAL     TONSILLECTOMY AND ADENOIDECTOMY     TRANSCATHETER AORTIC VALVE REPLACEMENT, TRANSFEMORAL N/A 06/21/2023   Procedure: Transcatheter Aortic Valve Replacement, Transfemoral;  Surgeon: Kathleene Hazel, MD;  [facility];  Service: Open Heart Surgery;  Laterality: N/A;   UPPER GASTROINTESTINAL ENDOSCOPY     VAGINAL HYSTERECTOMY     ovaries not excised   Patient Active Problem List  Diagnosis Date Noted   Physical deconditioning 01/25/2024   Abnormal ultrasound 07/27/2023   Lung nodule seen on imaging study 07/27/2023   Fatigue 07/27/2023   Constipation 06/26/2023   S/P TAVR (transcatheter aortic valve replacement) 06/21/2023   CAD (coronary artery disease)    History of CVA (cerebrovascular accident) 10/27/2022   Bilateral sensorineural hearing loss 09/22/2022   Chronic diastolic heart failure (HCC) 08/04/2022   Prediabetes 08/04/2022   Acute left-sided low back pain without sciatica 07/30/2020   Cavus deformity of foot 05/01/2020   Poor balance 05/10/2018   Left ventricular diastolic dysfunction 07/27/2017   Essential hypertension 07/27/2017   Fall  03/02/2017   Urinary incontinence 02/16/2017   Chronic venous insufficiency 08/20/2016   Xiphoid pain 03/05/2016   Insomnia 01/16/2016   Anxiety and depression 12/24/2015   Asthma, mild intermittent, well-controlled 11/24/2015   Carotid artery disease (HCC) 06/29/2011   Hypothyroidism 11/11/2010   Hyperlipidemia 11/11/2010   Senile osteoporosis 11/11/2010   RHINOSINUSITIS, RECURRENT 10/29/2008   Personal history of malignant neoplasm of breast 03/27/2008   GERD (gastroesophageal reflux disease) 03/27/2008   DIVERTICULOSIS 03/27/2008   Atherosclerosis of native coronary artery of native heart with angina pectoris (HCC) 08/23/2007   Severe aortic stenosis 08/23/2007   DVT 08/23/2007    ONSET DATE: August 2024  REFERRING DIAG: R26.89 (ICD-10-CM) - Poor balance R53.81 (ICD-10-CM) - Physical deconditioning  THERAPY DIAG:  Muscle weakness (generalized)  Difficulty in walking, not elsewhere classified  Unsteadiness on feet  Other symptoms and signs involving the musculoskeletal system  Rationale for Evaluation and Treatment: Rehabilitation  SUBJECTIVE:                                                                                                                                                                                             SUBJECTIVE STATEMENT: Doing better today, mastered the initial exercises.    Pt accompanied by: self  PERTINENT HISTORY: Breast CA s/p B mastectomy, CAD, cataract, depression, GERD, HLD, HTN, osteoporosis, CABG 2007, femoral-popliteal bypass graft 2024  PAIN:  Are you having pain? No  PRECAUTIONS: Fall and Other: osteoporosis  RED FLAGS: None   WEIGHT BEARING RESTRICTIONS: No  FALLS: Has patient fallen in last 6 months? No  LIVING ENVIRONMENT: Lives with: lives alone Lives in: Other townhome Stairs: 7 steps to enter with railing Has following equipment at home: Single point cane, Environmental consultant - 2 wheeled, and Grab bars  PLOF:  Independent; monthly cleaning service  PATIENT GOALS: work on endurance and improve balance   OBJECTIVE:   TODAY'S TREATMENT: 02/09/24 Activity Comments  HEP review   Semi-tandem 2x30 sec  Backwards  walk at counter x 2 min Cues for increased step length (hip extension)  Multi-sensory balance            TODAY'S TREATMENT: 02/06/24 Activity Comments  Berg Balance Test 41/56  Sit to stand 1x5    Sidestep x 2 min Along counter  Static balance At counter: feet together EO/EC x 30 sec. Head turns EO/EC x 30 sec  Calf raise 2x10   Pt education regarding stocking butler for compression stockings donning        PATIENT EDUCATION: Education details: prognosis, POC, HEP  Person educated: Patient Education method: Explanation, Demonstration, Tactile cues, Verbal cues, and Handouts Education comprehension: verbalized understanding and returned demonstration  HOME EXERCISE PROGRAM: Access Code: [email or web address] URL: [email or web address] Date: 01/31/2024 Prepared by: [facility] - Outpatient  Rehab - [facility]  Exercises - Sit to Stand with Counter Support  - 1 x daily - 5 x weekly - 2-3 sets - 5 reps - Heel Raises with Counter Support  - 1 x daily - 7 x weekly - 3 sets - 10 reps - Side Stepping with Counter Support  - 1 x daily - 7 x weekly - 3 sets - 1-2 min rounds hold - Feet Together Balance at [employer] Eyes Closed  - 1 x daily - 7 x weekly - 3 sets - 30 sec hold - Semi-Tandem Balance at [employer] Eyes Open  - 1 x daily - 7 x weekly - 3 sets - 30 sec hold - Backward Walking with Counter Support  - 1 x daily - 7 x weekly - 3 sets - 60 sec hold  Note: Objective measures were completed at Evaluation unless otherwise noted.  Vitals: 98% spO2, 64bpm; 134/64 mmHg  DIAGNOSTIC FINDINGS: none recent  COGNITION: Overall cognitive status: Within functional limits for tasks assessed   SENSATION: Reports reduced sensation in B feet  POSTURE: rounded shoulders,  forward head, and increased thoracic kyphosis  LOWER EXTREMITY MMT:    MMT (in sitting) Right Eval Left Eval  Hip flexion 3 3+  Hip extension    Hip abduction 3+ 3+  Hip adduction 3+ 3+  Hip internal rotation    Hip external rotation    Knee flexion 3+ 4-  Knee extension 3+ 4-  Ankle dorsiflexion 3 3  Ankle plantarflexion 2+ 2+  Ankle inversion    Ankle eversion    (Blank rows = not tested)  GAIT: Gait pattern: Very slow, short B step length, occasionally tripping on cane Assistive device utilized: Single point cane Level of assistance: CGA   FUNCTIONAL TESTS:  5 times sit to stand: 47.01 sec using B UEs  94% spO2 10 meter walk test: 43.89 sec with SPC  0.75 ft/sec                                                                                                                               TREATMENT DATE: 01/31/24  GOALS: Goals reviewed with patient? Yes  SHORT TERM GOALS: Target date: 02/21/2024  Patient to be independent with initial HEP. Baseline: HEP initiated Goal status: INITIAL    LONG TERM GOALS: Target date: 03/13/2024  Patient to be independent with advanced HEP. Baseline: Not yet initiated  Goal status: INITIAL  Patient to demonstrate B LE strength >/=4/5.  Baseline: See above Goal status: INITIAL  Patient to demonstrate gait speed of at least 1.5 ft/sec in order to improve access to community.  Baseline: 0.75 ft/sec Goal status: INITIAL  Patient to demonstrate 5xSTS test in <25 sec in order to decrease risk of falls.  Baseline: 47.01 sec using B UEs Goal status: INITIAL  Patient to score at least 46/56 on Berg in order to decrease risk of falls.  Baseline: 41/56 Goal status: INITIAL  Patient to report plans to participate in home or community exercise program to maintain fitness.  Baseline: not currently participating  Goal status: INITIAL  ASSESSMENT:  CLINICAL IMPRESSION: Demo excellent recall to initial HEP with main  limitation in squatting due to knee discomfort and instructed in limiting ROM and encouraging greater hip abd/ER for improved knee tracking due to prominent valgus deformity R> L knee with some improvement.  Additional balance exercises to improve stability under narrow BOS conditions with good tolerance to these movements and retro-walking to improve step length and hip extension for improved large amplitude movements for facilitation of righting reactions needing verbal cues for consistency and UE support to promote.  Multisensory balance challenges to improve postural stability and awareness for limits of stability with good tolerance despite increased sway. Continued sessions to progress POC details to improve mobility and reduce risk for falls.    OBJECTIVE IMPAIRMENTS: Abnormal gait, decreased activity tolerance, decreased balance, decreased endurance, decreased knowledge of use of DME, decreased strength, decreased safety awareness, and postural dysfunction.   ACTIVITY LIMITATIONS: carrying, lifting, bending, standing, squatting, stairs, transfers, bathing, toileting, dressing, reach over head, hygiene/grooming, and locomotion level  PARTICIPATION LIMITATIONS: meal prep, cleaning, laundry, driving, shopping, community activity, and church  PERSONAL FACTORS: Age, Fitness, Past/current experiences, Time since onset of injury/illness/exacerbation, and 3+ comorbidities: Breast CA s/p B mastectomy, CAD, cataract, depression, GERD, HLD, HTN, osteoporosis, CABG 2007, femoral-popliteal bypass graft 2024  are also affecting patient's functional outcome.   REHAB POTENTIAL: Good  CLINICAL DECISION MAKING: Evolving/moderate complexity  EVALUATION COMPLEXITY: Moderate  PLAN:  PT FREQUENCY: 2x/week  PT DURATION: 6 weeks  PLANNED INTERVENTIONS: 97164- PT Re-evaluation, 97110-Therapeutic exercises, 97530- Therapeutic activity, 97112- Neuromuscular re-education, 97535- Self Care, 16109- Manual therapy,  [phone]- Gait training, [phone]- Canalith repositioning, Patient/Family education, Balance training, Stair training, Taping, Dry Needling, Joint mobilization, Spinal mobilization, Vestibular training, Cryotherapy, and Moist heat  PLAN FOR NEXT SESSION: review HEP with endurance training and balance activities that she can perform safely. May want to edu pt on monitoring her vitals at home and staying within safe HR range    4:20 PM, 02/09/24 M. Shary Decamp, PT, DPT Physical Therapist- [place] Office Number: [phone]

## 2024-02-12 ENCOUNTER — Other Ambulatory Visit: Payer: Self-pay | Admitting: Internal Medicine

## 2024-02-13 NOTE — Therapy (Signed)
 OUTPATIENT PHYSICAL THERAPY NEURO TREATMENT   Patient Name: [name] MRN: [id number] DOB:1940/11/19, 83 y.o., female Today's Date: 02/14/2024   PCP: Pincus Sanes, MD  REFERRING PROVIDER: Pincus Sanes, MD   END OF SESSION:  PT End of Session - 02/14/24 1703     Visit Number 4    Number of Visits 13    Date for PT Re-Evaluation 03/13/24    Authorization Type Medicare/BCBS    Authorization - Number of Visits 60    PT Start Time 1617    PT Stop Time 1700    PT Time Calculation (min) 43 min    Activity Tolerance Patient limited by fatigue    Behavior During Therapy [facility] for tasks assessed/performed              Past Medical History:  Diagnosis Date   Allergy    SEASONAL   Anxiety    BREAST CANCER 1991   left, 1991; B mastectomy   CAD (coronary artery disease)    s/p CABG x5 in 2007   Cataract    BILATERAL-REMOVED   Depression    DIVERTICULOSIS    GERD    History of DVT (deep vein thrombosis)    HYPERLIPIDEMIA    Hypertension    HYPOTHYROIDISM    Occlusion and stenosis of carotid artery without mention of cerebral infarction    OSTEOPOROSIS    S/P TAVR (transcatheter aortic valve replacement) 06/21/2023   s/p TAVR with a 23mm Edwards S3UR via the TF approach by Dr. Clifton James & Dr. Leafy Ro   Severe aortic stenosis    Past Surgical History:  Procedure Laterality Date   APPENDECTOMY     COLONOSCOPY     CORONARY ARTERY BYPASS GRAFT     5        2007   ENDARTERECTOMY FEMORAL Right 06/22/2023   Procedure: RIGHT FEMORAL ENDARTERECTOMY WITH PATCH ANGIOPLASTY;  Surgeon: Nada Libman, MD;  [facility];  Service: Vascular;  Laterality: Right;   FEMORAL-POPLITEAL BYPASS GRAFT Right 06/22/2023   Procedure: FEMORAL THROMBECTOMY;  Surgeon: Nada Libman, MD;  [facility];  Service: Vascular;  Laterality: Right;   INTRAOPERATIVE TRANSTHORACIC ECHOCARDIOGRAM N/A 06/21/2023   Procedure: INTRAOPERATIVE TRANSTHORACIC ECHOCARDIOGRAM;  Surgeon: Kathleene Hazel, MD;  [facility];  Service: Open Heart Surgery;  Laterality: N/A;   MASTECTOMY     left with reconstruction and lymph node excision  1992   MASTECTOMY     right with reconstruction   PATCH ANGIOPLASTY Right 06/22/2023   Procedure: PATCH ANGIOPLASTY OF RIGHT FEMORAL ARTERY USING BOVINE PERICARDIAL PATCH;  Surgeon: Nada Libman, MD;  [facility];  Service: Vascular;  Laterality: Right;   REVISION RECONSTRUCTED BREAST Left 02/2014   willard (plastics in [place])   right ankle arthroscopy     RIGHT/LEFT HEART CATH AND CORONARY ANGIOGRAPHY N/A 06/09/2023   Procedure: RIGHT/LEFT HEART CATH AND CORONARY ANGIOGRAPHY;  Surgeon: Kathleene Hazel, MD;  [facility];  Service: Cardiovascular;  Laterality: N/A;   TEAR DUCT CANAL     TONSILLECTOMY AND ADENOIDECTOMY     TRANSCATHETER AORTIC VALVE REPLACEMENT, TRANSFEMORAL N/A 06/21/2023   Procedure: Transcatheter Aortic Valve Replacement, Transfemoral;  Surgeon: Kathleene Hazel, MD;  [facility];  Service: Open Heart Surgery;  Laterality: N/A;   UPPER GASTROINTESTINAL ENDOSCOPY     VAGINAL HYSTERECTOMY     ovaries not excised   Patient Active Problem List  Diagnosis Date Noted   Physical deconditioning 01/25/2024   Abnormal ultrasound 07/27/2023   Lung nodule seen on imaging study 07/27/2023   Fatigue 07/27/2023   Constipation 06/26/2023   S/P TAVR (transcatheter aortic valve replacement) 06/21/2023   CAD (coronary artery disease)    History of CVA (cerebrovascular accident) 10/27/2022   Bilateral sensorineural hearing loss 09/22/2022   Chronic diastolic heart failure (HCC) 08/04/2022   Prediabetes 08/04/2022   Acute left-sided low back pain without sciatica 07/30/2020   Cavus deformity of foot 05/01/2020   Poor balance 05/10/2018   Left ventricular diastolic dysfunction 07/27/2017   Essential hypertension 07/27/2017   Fall 03/02/2017   Urinary incontinence  02/16/2017   Chronic venous insufficiency 08/20/2016   Xiphoid pain 03/05/2016   Insomnia 01/16/2016   Anxiety and depression 12/24/2015   Asthma, mild intermittent, well-controlled 11/24/2015   Carotid artery disease (HCC) 06/29/2011   Hypothyroidism 11/11/2010   Hyperlipidemia 11/11/2010   Senile osteoporosis 11/11/2010   RHINOSINUSITIS, RECURRENT 10/29/2008   Personal history of malignant neoplasm of breast 03/27/2008   GERD (gastroesophageal reflux disease) 03/27/2008   DIVERTICULOSIS 03/27/2008   Atherosclerosis of native coronary artery of native heart with angina pectoris (HCC) 08/23/2007   Severe aortic stenosis 08/23/2007   DVT 08/23/2007    ONSET DATE: August 2024  REFERRING DIAG: R26.89 (ICD-10-CM) - Poor balance R53.81 (ICD-10-CM) - Physical deconditioning  THERAPY DIAG:  Muscle weakness (generalized)  Difficulty in walking, not elsewhere classified  Unsteadiness on feet  Other symptoms and signs involving the musculoskeletal system  Rationale for Evaluation and Treatment: Rehabilitation  SUBJECTIVE:                                                                                                                                                                                             SUBJECTIVE STATEMENT: Been out breath.   Pt accompanied by: self  PERTINENT HISTORY: Breast CA s/p B mastectomy, CAD, cataract, depression, GERD, HLD, HTN, osteoporosis, CABG 2007, femoral-popliteal bypass graft 2024  PAIN:  Are you having pain? No   PRECAUTIONS: Fall and Other: osteoporosis  RED FLAGS: None   WEIGHT BEARING RESTRICTIONS: No  FALLS: Has patient fallen in last 6 months? 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  PATIENT GOALS: work on endurance and  improve balance   OBJECTIVE:     TODAY'S TREATMENT: 02/14/24 Activity Comments  Vitals at start of session  97% spO2, 61 bpm  sitting ther-ex: LAQ 2# 2x10 march 2# 2x20 HS curl red TB 10x each  STS with CGA 2x5 (limited eccentric control) Cueing to keep count/stay on track- pt had a hard time   57-76bpm, 87-94% spO2  HR taken manually and feels irregular     PATIENT EDUCATION: Education details: discussed pt's continued symptoms of SOB and fatigue; discussed pt's challenges with ADLs- she reports doing laundry 1x/month and "eating out a lot" Person educated: Patient Education method: Explanation Education comprehension: verbalized understanding      HOME EXERCISE PROGRAM: Access Code: [email or web address] URL: [email or web address] Date: 01/31/2024 Prepared by: [facility] - Outpatient  Rehab - [facility]  Exercises - Sit to Stand with Counter Support  - 1 x daily - 5 x weekly - 2-3 sets - 5 reps - Heel Raises with Counter Support  - 1 x daily - 7 x weekly - 3 sets - 10 reps - Side Stepping with Counter Support  - 1 x daily - 7 x weekly - 3 sets - 1-2 min rounds hold - Feet Together Balance at [employer] Closed  - 1 x daily - 7 x weekly - 3 sets - 30 sec hold - Semi-Tandem Balance at [employer] Open  - 1 x daily - 7 x weekly - 3 sets - 30 sec hold - Backward Walking with Counter Support  - 1 x daily - 7 x weekly - 3 sets - 60 sec hold  Note: Objective measures were completed at Evaluation unless otherwise noted.  Vitals: 98% spO2, 64bpm; 134/64 mmHg  DIAGNOSTIC FINDINGS: none recent  COGNITION: Overall cognitive status: Within functional limits for tasks assessed   SENSATION: Reports reduced sensation in B feet  POSTURE: rounded shoulders, forward head, and increased thoracic kyphosis  LOWER EXTREMITY MMT:    MMT (in sitting) Right Eval Left Eval  Hip flexion 3 3+  Hip extension    Hip abduction 3+ 3+  Hip adduction 3+ 3+  Hip internal  rotation    Hip external rotation    Knee flexion 3+ 4-  Knee extension 3+ 4-  Ankle dorsiflexion 3 3  Ankle plantarflexion 2+ 2+  Ankle inversion    Ankle eversion    (Blank rows = not tested)  GAIT: Gait pattern: Very slow, short B step length, occasionally tripping on cane Assistive device utilized: Single point cane Level of assistance: CGA   FUNCTIONAL TESTS:  5 times sit to stand: 47.01 sec using B UEs  94% spO2 10 meter walk test: 43.89 sec with SPC  0.75 ft/sec                                                                                                                               TREATMENT DATE: 01/31/24      GOALS: Goals reviewed with patient? Yes  SHORT TERM GOALS: Target date: 02/21/2024  Patient to be independent with initial HEP. Baseline: HEP initiated Goal status:  IN PROGRESS    LONG TERM GOALS: Target date: 03/13/2024  Patient to be independent with advanced HEP. Baseline: Not yet initiated  Goal status: IN PROGRESS  Patient to demonstrate B LE strength >/=4/5.  Baseline: See above Goal status: IN PROGRESS  Patient to demonstrate gait speed of at least 1.5 ft/sec in order to improve access to community.  Baseline: 0.75 ft/sec Goal status: IN PROGRESS  Patient to demonstrate 5xSTS test in <25 sec in order to decrease risk of falls.  Baseline: 47.01 sec using B UEs Goal status: IN PROGRESS  Patient to score at least 46/56 on Berg in order to decrease risk of falls.  Baseline: 41/56 Goal status: IN PROGRESS  Patient to report plans to participate in home or community exercise program to maintain fitness.  Baseline: not currently participating  Goal status: IN PROGRESS  ASSESSMENT:  CLINICAL IMPRESSION: Patient arrived to session with report of continued SOB. Session focused on seated ther-ex to improve activity tolerance in a safe position. Vitals were closely monitored throughout, with spO2 dropping down to 87% during session. HR was  well-maintained however HR feels irregular at times when taken manually. Contacted pt's PCP as patient does not reports hx of a-fib. Patient declined escort out to car for safety.    OBJECTIVE IMPAIRMENTS: Abnormal gait, decreased activity tolerance, decreased balance, decreased endurance, decreased knowledge of use of DME, decreased strength, decreased safety awareness, and postural dysfunction.   ACTIVITY LIMITATIONS: carrying, lifting, bending, standing, squatting, stairs, transfers, bathing, toileting, dressing, reach over head, hygiene/grooming, and locomotion level  PARTICIPATION LIMITATIONS: meal prep, cleaning, laundry, driving, shopping, community activity, and church  PERSONAL FACTORS: Age, Fitness, Past/current experiences, Time since onset of injury/illness/exacerbation, and 3+ comorbidities: Breast CA s/p B mastectomy, CAD, cataract, depression, GERD, HLD, HTN, osteoporosis, CABG 2007, femoral-popliteal bypass graft 2024  are also affecting patient's functional outcome.   REHAB POTENTIAL: Good  CLINICAL DECISION MAKING: Evolving/moderate complexity  EVALUATION COMPLEXITY: Moderate  PLAN:  PT FREQUENCY: 2x/week  PT DURATION: 6 weeks  PLANNED INTERVENTIONS: 97164- PT Re-evaluation, 97110-Therapeutic exercises, 97530- Therapeutic activity, 97112- Neuromuscular re-education, 97535- Self Care, 04540- Manual therapy, [phone]- Gait training, [phone]- Canalith repositioning, Patient/Family education, Balance training, Stair training, Taping, Dry Needling, Joint mobilization, Spinal mobilization, Vestibular training, Cryotherapy, and Moist heat  PLAN FOR NEXT SESSION: review HEP with endurance training and balance activities that she can perform safely. May want to edu pt on monitoring her vitals at home and staying within safe HR range     Baldemar Friday, , DPT 02/14/24 5:15 PM  [facility] at [facility] [address] [place], [address] [place], [place] [address] Phone # [phone] Fax # [phone]

## 2024-02-14 ENCOUNTER — Encounter: Payer: Self-pay | Admitting: Physical Therapy

## 2024-02-14 ENCOUNTER — Ambulatory Visit: Attending: Internal Medicine | Admitting: Physical Therapy

## 2024-02-14 ENCOUNTER — Telehealth: Payer: Self-pay | Admitting: Physical Therapy

## 2024-02-14 DIAGNOSIS — R29898 Other symptoms and signs involving the musculoskeletal system: Secondary | ICD-10-CM | POA: Diagnosis not present

## 2024-02-14 DIAGNOSIS — R2681 Unsteadiness on feet: Secondary | ICD-10-CM

## 2024-02-14 DIAGNOSIS — R262 Difficulty in walking, not elsewhere classified: Secondary | ICD-10-CM | POA: Diagnosis not present

## 2024-02-14 DIAGNOSIS — M6281 Muscle weakness (generalized): Secondary | ICD-10-CM | POA: Diagnosis not present

## 2024-02-14 NOTE — Telephone Encounter (Signed)
 Hi Dr. Lawerance Bach,  I am seeing Ms. [name] in OPPT per your referral. She continues to report and appear quite breathless at our appointments. Upon taking pulse manually, her HR will occasionally feel irregular. I wanted to know if you recommend workup for possible A-fib as a cause of her continued symptoms or if she has already been worked up for this?   Thanks!   Baldemar Friday, PT, DPT 02/14/24 4:45 PM  [facility] [address] [place], [address] [place], [place] [address] Phone # [phone] Fax # [phone]

## 2024-02-14 NOTE — Telephone Encounter (Signed)
 Thank you [name].  We will call her and have her come in for an appointment.     [name] - can you call her and get her in for an appt.

## 2024-02-15 NOTE — Telephone Encounter (Signed)
 Message left for patient to contact office and schedule appointment.

## 2024-02-16 ENCOUNTER — Ambulatory Visit

## 2024-02-17 ENCOUNTER — Ambulatory Visit (INDEPENDENT_AMBULATORY_CARE_PROVIDER_SITE_OTHER): Admitting: Internal Medicine

## 2024-02-17 ENCOUNTER — Encounter: Payer: Self-pay | Admitting: Internal Medicine

## 2024-02-17 VITALS — BP 106/68 | HR 86 | Ht 63.0 in | Wt 120.0 lb

## 2024-02-17 DIAGNOSIS — R5383 Other fatigue: Secondary | ICD-10-CM

## 2024-02-17 DIAGNOSIS — I10 Essential (primary) hypertension: Secondary | ICD-10-CM

## 2024-02-17 DIAGNOSIS — R258 Other abnormal involuntary movements: Secondary | ICD-10-CM

## 2024-02-17 MED ORDER — LOSARTAN POTASSIUM 25 MG PO TABS
25.0000 mg | ORAL_TABLET | Freq: Every day | ORAL | 3 refills | Status: DC
Start: 2024-02-17 — End: 2024-09-19

## 2024-02-17 NOTE — Assessment & Plan Note (Signed)
 Subacute She is not able to Chelle me how long this has been going on, but she is experiencing significant fatigue She also states softer weak voice, difficulty writing, poor balance and describes being slower-her friends have commented on that No tremor Blood work last month unremarkable ?  Parkinson's or other neurological cause Will refer to Dr. Rosalyn Gess who she is seen before for evaluation

## 2024-02-17 NOTE — Progress Notes (Signed)
 Subjective:    Patient ID: [name], female    DOB: 02-10-1941, 83 y.o.   MRN: [id number]      HPI [name] is here for  Chief Complaint  Patient presents with   Medical Management of Chronic Issues     Feels very fatigued.  Can't write and her voice won't come to her.  When she gets up in the mroning her head feels funny  Gets about 10 hrs - interrupted sleep.  Medications and allergies reviewed with patient and updated if appropriate.  Current Outpatient Medications on File Prior to Visit  Medication Sig Dispense Refill   aspirin 81 MG tablet Take 81 mg by mouth daily.       atorvastatin (LIPITOR) 20 MG tablet TAKE 1 TABLET BY MOUTH EVERY DAY 90 tablet 1   Calcium Carbonate (CALTRATE 600 PO) Take 1 tablet by mouth 2 (two) times daily.       citalopram (CELEXA) 20 MG tablet TAKE 1 TABLET BY MOUTH EVERY DAY 90 tablet 2   co-enzyme Q-10 30 MG capsule Take 30 mg by mouth daily.     famotidine (PEPCID) 40 MG tablet TAKE 1 TABLET BY MOUTH EVERY DAY 90 tablet 1   fluticasone (FLONASE) 50 MCG/ACT nasal spray Place 1 spray into both nostrils daily as needed for allergies or rhinitis. 9.9 mL 5   furosemide (LASIX) 20 MG tablet TAKE 1 TABLET BY MOUTH EVERY DAY 90 tablet 3   levothyroxine (SYNTHROID) 75 MCG tablet TAKE 1 TABLET BY MOUTH EVERY MORNING BEFORE BREAKFAST 90 tablet 1   losartan (COZAAR) 50 MG tablet TAKE 1 TABLET BY MOUTH EVERY DAY 90 tablet 2   meloxicam (MOBIC) 7.5 MG tablet TAKE 1/2 TO 1 TABLET EVERY DAY 90 tablet 0   metoprolol tartrate (LOPRESSOR) 25 MG tablet TAKE 1 TABLET BY MOUTH TWICE A DAY 180 tablet 1   Multiple Vitamins-Minerals (MULTIVITAMIN) LIQD Take 1 tablet by mouth daily.     Multiple Vitamins-Minerals (PRESERVISION AREDS 2) CAPS Take 1 capsule by mouth 2 (two) times daily.     senna-docusate (SENOKOT-S) 8.6-50 MG per tablet Take 1 tablet by mouth daily.       VITAMIN D, CHOLECALCIFEROL, PO Take 1 tablet by mouth daily.     zolpidem (AMBIEN) 5 MG  tablet TAKE 1 TABLET (5 MG TOTAL) BY MOUTH EVERY DAY AT BEDTIME AS NEEDED FOR SLEEP 30 tablet 5   No current facility-administered medications on file prior to visit.    Review of Systems  Constitutional:  Positive for fatigue. Negative for appetite change and fever.  HENT:  Negative for trouble swallowing.   Respiratory:  Positive for shortness of breath (chronic - no change). Negative for cough and wheezing.   Cardiovascular:  Positive for leg swelling (mild). Negative for chest pain and palpitations.  Gastrointestinal:  Negative for abdominal pain, constipation and diarrhea.       No gerd  Genitourinary:  Positive for frequency (at night). Negative for dysuria.  Neurological:  Negative for dizziness and light-headedness.       Objective:   Vitals:   02/17/24 1554  BP: 106/68  Pulse: 86  SpO2: 98%   BP Readings from Last 3 Encounters:  02/17/24 106/68  01/25/24 116/66  12/01/23 110/64   Wt Readings from Last 3 Encounters:  02/17/24 120 lb (54.4 kg)  01/25/24 121 lb (54.9 kg)  01/23/24 118 lb (53.5 kg)   Body mass index is 21.26 kg/m.    Physical  Exam Constitutional:      General: She is not in acute distress.    Appearance: Normal appearance.  HENT:     Head: Normocephalic and atraumatic.  Eyes:     Conjunctiva/sclera: Conjunctivae normal.  Cardiovascular:     Rate and Rhythm: Normal rate and regular rhythm.     Heart sounds: Murmur (3/6 sys) heard.  Pulmonary:     Effort: Pulmonary effort is normal. No respiratory distress.     Breath sounds: Normal breath sounds. No wheezing.  Musculoskeletal:     Cervical back: Neck supple.     Right lower leg: Edema (mild) present.     Left lower leg: No edema.  Lymphadenopathy:     Cervical: No cervical adenopathy.  Skin:    General: Skin is warm and dry.     Findings: No rash.  Neurological:     Mental Status: She is alert. Mental status is at baseline.  Psychiatric:        Mood and Affect: Mood normal.         Behavior: Behavior normal.            Assessment & Plan:    See Problem List for Assessment and Plan of chronic medical problems.

## 2024-02-17 NOTE — Assessment & Plan Note (Signed)
 Chronic Blood pressure on the low side and she has had some fatigue ?  Low blood pressure contributing to fatigue Decrease losartan to 25 mg daily Continue metoprolol 25 mg twice daily Follow-up in 3-4 weeks  EKG: Sinus bradycardia at 54 bpm, inferior infarct infarct age undetermined, anterior infarct age undetermined.  No significant change compared to EKG from 08/2023

## 2024-02-17 NOTE — Patient Instructions (Addendum)
       Medications changes include :   decrease losartan to 25 mg daily    A referral was ordered [place] [facility] - Dr Karel Jarvis and someone will call you to schedule an appointment.     Return for 3-4 weeks follow up.

## 2024-02-17 NOTE — Assessment & Plan Note (Signed)
 Subacute She  is experiencing significant fatigue, softer weak voice, difficulty writing, poor balance and describes being slower-her friends have commented on that No tremor Blood work last month unremarkable ?  Parkinson's or other neurological cause Will refer to Dr. Rosalyn Gess who she is seen before for evaluation

## 2024-02-21 ENCOUNTER — Ambulatory Visit

## 2024-02-21 DIAGNOSIS — M6281 Muscle weakness (generalized): Secondary | ICD-10-CM

## 2024-02-21 DIAGNOSIS — R29898 Other symptoms and signs involving the musculoskeletal system: Secondary | ICD-10-CM

## 2024-02-21 DIAGNOSIS — R2681 Unsteadiness on feet: Secondary | ICD-10-CM

## 2024-02-21 DIAGNOSIS — R262 Difficulty in walking, not elsewhere classified: Secondary | ICD-10-CM

## 2024-02-21 NOTE — Therapy (Signed)
 OUTPATIENT PHYSICAL THERAPY NEURO TREATMENT   Patient Name: [name] MRN: [id number] DOB:August 18, 1941, 83 y.o., female Today's Date: 02/21/2024   PCP: Pincus Sanes, MD  REFERRING PROVIDER: Pincus Sanes, MD   END OF SESSION:  PT End of Session - 02/21/24 1626     Visit Number 5    Number of Visits 13    Date for PT Re-Evaluation 03/13/24    Authorization Type Medicare/BCBS    Authorization - Number of Visits 60    PT Start Time 1626   late arrival   PT Stop Time 1700    PT Time Calculation (min) 34 min    Activity Tolerance Patient limited by fatigue    Behavior During Therapy [facility] for tasks assessed/performed              Past Medical History:  Diagnosis Date   Allergy    SEASONAL   Anxiety    BREAST CANCER 1991   left, 1991; B mastectomy   CAD (coronary artery disease)    s/p CABG x5 in 2007   Cataract    BILATERAL-REMOVED   Depression    DIVERTICULOSIS    GERD    History of DVT (deep vein thrombosis)    HYPERLIPIDEMIA    Hypertension    HYPOTHYROIDISM    Occlusion and stenosis of carotid artery without mention of cerebral infarction    OSTEOPOROSIS    S/P TAVR (transcatheter aortic valve replacement) 06/21/2023   s/p TAVR with a 23mm Edwards S3UR via the TF approach by Dr. Clifton James & Dr. Leafy Ro   Severe aortic stenosis    Past Surgical History:  Procedure Laterality Date   APPENDECTOMY     COLONOSCOPY     CORONARY ARTERY BYPASS GRAFT     5        2007   ENDARTERECTOMY FEMORAL Right 06/22/2023   Procedure: RIGHT FEMORAL ENDARTERECTOMY WITH PATCH ANGIOPLASTY;  Surgeon: Nada Libman, MD;  Location: [facility] OR;  Service: Vascular;  Laterality: Right;   FEMORAL-POPLITEAL BYPASS GRAFT Right 06/22/2023   Procedure: FEMORAL THROMBECTOMY;  Surgeon: Nada Libman, MD;  Location: [facility] OR;  Service: Vascular;  Laterality: Right;   INTRAOPERATIVE TRANSTHORACIC ECHOCARDIOGRAM N/A 06/21/2023   Procedure: INTRAOPERATIVE TRANSTHORACIC ECHOCARDIOGRAM;  Surgeon:  Kathleene Hazel, MD;  Location: [facility] INVASIVE CV LAB;  Service: Open Heart Surgery;  Laterality: N/A;   MASTECTOMY     left with reconstruction and lymph node excision  1992   MASTECTOMY     right with reconstruction   PATCH ANGIOPLASTY Right 06/22/2023   Procedure: PATCH ANGIOPLASTY OF RIGHT FEMORAL ARTERY USING BOVINE PERICARDIAL PATCH;  Surgeon: Nada Libman, MD;  Location: [facility] OR;  Service: Vascular;  Laterality: Right;   REVISION RECONSTRUCTED BREAST Left 02/2014   willard (plastics in [place])   right ankle arthroscopy     RIGHT/LEFT HEART CATH AND CORONARY ANGIOGRAPHY N/A 06/09/2023   Procedure: RIGHT/LEFT HEART CATH AND CORONARY ANGIOGRAPHY;  Surgeon: Kathleene Hazel, MD;  Location: [facility] INVAS[facility];  Service: Cardiovascular;  Laterality: N/A;   TEAR DUCT CANAL     TONSILLECTOMY AND ADENOIDECTOMY     TRANSCATHETER AORTIC VALVE REPLACEMENT, TRANSFEMORAL N/A 06/21/2023   Procedure: Transcatheter Aortic Valve Replacement, Transfemoral;  Surgeon: Kathleene Hazel, MD;  Location: [facility] INV[facility];  Service: Open Heart Surgery;  Laterality: N/A;   UPPER GASTROINTESTINAL ENDOSCOPY     VAGINAL HYSTERECTOMY     ovaries not excised   Patient Active Problem  List   Diagnosis Date Noted   Bradykinesia 02/17/2024   Physical deconditioning 01/25/2024   Abnormal ultrasound 07/27/2023   Lung nodule seen on imaging study 07/27/2023   Fatigue 07/27/2023   Constipation 06/26/2023   S/P TAVR (transcatheter aortic valve replacement) 06/21/2023   CAD (coronary artery disease)    History of CVA (cerebrovascular accident) 10/27/2022   Bilateral sensorineural hearing loss 09/22/2022   Chronic diastolic heart failure (HCC) 08/04/2022   Prediabetes 08/04/2022   Acute left-sided low back pain without sciatica 07/30/2020   Cavus deformity of foot 05/01/2020   Poor balance 05/10/2018   Left ventricular diastolic dysfunction 07/27/2017   Essential hypertension 07/27/2017   Fall  03/02/2017   Urinary incontinence 02/16/2017   Chronic venous insufficiency 08/20/2016   Xiphoid pain 03/05/2016   Insomnia 01/16/2016   Anxiety and depression 12/24/2015   Asthma, mild intermittent, well-controlled 11/24/2015   Carotid artery disease (HCC) 06/29/2011   Hypothyroidism 11/11/2010   Hyperlipidemia 11/11/2010   Senile osteoporosis 11/11/2010   RHINOSINUSITIS, RECURRENT 10/29/2008   Personal history of malignant neoplasm of breast 03/27/2008   GERD (gastroesophageal reflux disease) 03/27/2008   DIVERTICULOSIS 03/27/2008   Atherosclerosis of native coronary artery of native heart with angina pectoris (HCC) 08/23/2007   Severe aortic stenosis 08/23/2007   DVT 08/23/2007    ONSET DATE: August 2024  REFERRING DIAG: R26.89 (ICD-10-CM) - Poor balance R53.81 (ICD-10-CM) - Physical deconditioning  THERAPY DIAG:  Muscle weakness (generalized)  Difficulty in walking, not elsewhere classified  Unsteadiness on feet  Other symptoms and signs involving the musculoskeletal system  Rationale for Evaluation and Treatment: Rehabilitation  SUBJECTIVE:                                                                                                                                                                                             SUBJECTIVE STATEMENT: Mainly experiencing DOE.  Feeling lack of energy and want to sleep a lot  Pt accompanied by: self  PERTINENT HISTORY: Breast CA s/p B mastectomy, CAD, cataract, depression, GERD, HLD, HTN, osteoporosis, CABG 2007, femoral-popliteal bypass graft 2024  PAIN:  Are you having pain? No   PRECAUTIONS: Fall and Other: osteoporosis  RED FLAGS: None   WEIGHT BEARING RESTRICTIONS: No  FALLS: Has patient fallen in last 6 months? No  LIVING ENVIRONMENT: Lives with: lives alone Lives in: Other townhome Stairs: 7 steps to enter with railing Has following equipment at home: Single point cane, Environmental consultant - 2 wheeled, and Grab  bars  PLOF: Independent; monthly cleaning service  PATIENT GOALS: work on endurance and improve balance   OBJECTIVE:   TODAY'S TREATMENT:  02/21/24 Activity Comments  Vitals: 170/80 mmHg, 55 bpm, 90-96%, (57 bpm manual)   HEP review   Vitals: 166/85 mmHg, 48 bpm   Feet together EC+head turns HEP addition  Semi-tandem eyes closed x 30 sec HEP addition            TODAY'S TREATMENT: 02/14/24 Activity Comments  Vitals at start of session  97% spO2, 61 bpm   sitting ther-ex: LAQ 2# 2x10 march 2# 2x20 HS curl red TB 10x each  STS with CGA 2x5 (limited eccentric control) Cueing to keep count/stay on track- pt had a hard time   57-76bpm, 87-94% spO2  HR taken manually and feels irregular     PATIENT EDUCATION: Education details: discussed pt's continued symptoms of SOB and fatigue; discussed pt's challenges with ADLs- she reports doing laundry 1x/month and "eating out a lot" Person educated: Patient Education method: Explanation Education comprehension: verbalized understanding      HOME EXERCISE PROGRAM: Access Code: [email or web address] URL: [email or web address] Date: 01/31/2024 Prepared by: [facility] - Outpatient  Rehab - [facility]  Exercises - Sit to Stand with Counter Support  - 1 x daily - 5 x weekly - 2-3 sets - 5 reps - Heel Raises with Counter Support  - 1 x daily - 7 x weekly - 3 sets - 10 reps - Side Stepping with Counter Support  - 1 x daily - 7 x weekly - 3 sets - 1-2 min rounds hold - Feet Together Balance at [employer] Eyes Closed  - 1 x daily - 7 x weekly - 3 sets - 30 sec hold - Semi-Tandem Balance at [employer] Eyes Open  - 1 x daily - 7 x weekly - 3 sets - 30 sec hold - Backward Walking with Counter Support  - 1 x daily - 7 x weekly - 3 sets - 60 sec hold - Semi-Tandem Balance at [employer] Eyes Closed  - 1 x daily - 7 x weekly - 3 sets - 15-30 sec hold - Corner Balance Feet Together: Eyes Closed With Head Turns  - 1 x daily - 7 x weekly - 3  sets - 3 reps  Note: Objective measures were completed at Evaluation unless otherwise noted.  Vitals: 98% spO2, 64bpm; 134/64 mmHg  DIAGNOSTIC FINDINGS: none recent  COGNITION: Overall cognitive status: Within functional limits for tasks assessed   SENSATION: Reports reduced sensation in B feet  POSTURE: rounded shoulders, forward head, and increased thoracic kyphosis  LOWER EXTREMITY MMT:    MMT (in sitting) Right Eval Left Eval  Hip flexion 3 3+  Hip extension    Hip abduction 3+ 3+  Hip adduction 3+ 3+  Hip internal rotation    Hip external rotation    Knee flexion 3+ 4-  Knee extension 3+ 4-  Ankle dorsiflexion 3 3  Ankle plantarflexion 2+ 2+  Ankle inversion    Ankle eversion    (Blank rows = not tested)  GAIT: Gait pattern: Very slow, short B step length, occasionally tripping on cane Assistive device utilized: Single point cane Level of assistance: CGA   FUNCTIONAL TESTS:  5 times sit to stand: 47.01 sec using B UEs  94% spO2 10 meter walk test: 43.89 sec with SPC  0.75 ft/sec  TREATMENT DATE: 01/31/24      GOALS: Goals reviewed with patient? Yes  SHORT TERM GOALS: Target date: 02/21/2024  Patient to be independent with initial HEP. Baseline: HEP initiated; good recall Goal status: MET    LONG TERM GOALS: Target date: 03/13/2024  Patient to be independent with advanced HEP. Baseline: Not yet initiated  Goal status: IN PROGRESS  Patient to demonstrate B LE strength >/=4/5.  Baseline: See above Goal status: IN PROGRESS  Patient to demonstrate gait speed of at least 1.5 ft/sec in order to improve access to community.  Baseline: 0.75 ft/sec Goal status: IN PROGRESS  Patient to demonstrate 5xSTS test in <25 sec in order to decrease risk of falls.  Baseline: 47.01 sec using B UEs Goal status: IN PROGRESS  Patient to  score at least 46/56 on Berg in order to decrease risk of falls.  Baseline: 41/56 Goal status: IN PROGRESS  Patient to report plans to participate in home or community exercise program to maintain fitness.  Baseline: not currently participating  Goal status: IN PROGRESS  ASSESSMENT:  CLINICAL IMPRESSION: Notes ongoing general fatigue and DOE.  Vitals reveal elevated BP and bradycardia at rest. Inititaed HEP review with good recall and vitals monitored after activity with no change to systolic pressure, but HR did elevate to 70 bpm under exertion and no obvious DOE/SOB noted with sats remaining 93-96% w/ exertion.  Addition to HEP for more challenging static standing balance. Continued sessions to progress activities for improved endurance/activity tolerance   OBJECTIVE IMPAIRMENTS: Abnormal gait, decreased activity tolerance, decreased balance, decreased endurance, decreased knowledge of use of DME, decreased strength, decreased safety awareness, and postural dysfunction.   ACTIVITY LIMITATIONS: carrying, lifting, bending, standing, squatting, stairs, transfers, bathing, toileting, dressing, reach over head, hygiene/grooming, and locomotion level  PARTICIPATION LIMITATIONS: meal prep, cleaning, laundry, driving, shopping, community activity, and church  PERSONAL FACTORS: Age, Fitness, Past/current experiences, Time since onset of injury/illness/exacerbation, and 3+ comorbidities: Breast CA s/p B mastectomy, CAD, cataract, depression, GERD, HLD, HTN, osteoporosis, CABG 2007, femoral-popliteal bypass graft 2024  are also affecting patient's functional outcome.   REHAB POTENTIAL: Good  CLINICAL DECISION MAKING: Evolving/moderate complexity  EVALUATION COMPLEXITY: Moderate  PLAN:  PT FREQUENCY: 2x/week  PT DURATION: 6 weeks  PLANNED INTERVENTIONS: 97164- PT Re-evaluation, 97110-Therapeutic exercises, 97530- Therapeutic activity, 97112- Neuromuscular re-education, 97535- Self Care,  97140- Manual therapy, [id number]- Gait training, 40981- Canalith repositioning, Patient/Family education, Balance training, Stair training, Taping, Dry Needling, Joint mobilization, Spinal mobilization, Vestibular training, Cryotherapy, and Moist heat  PLAN FOR NEXT SESSION: endurance training and balance activities that she can perform safely. May want to edu pt on monitoring her vitals at home and staying within safe HR range     5:09 PM, 02/21/24 M. Shary Decamp, PT, DPT Physical Therapist- [place] Office Number: [phone]

## 2024-02-22 NOTE — Therapy (Incomplete)
 OUTPATIENT PHYSICAL THERAPY NEURO TREATMENT   Patient Name: [name] MRN: [id number] DOB:1941-08-17, 83 y.o., female Today's Date: 02/22/2024   PCP: Pincus Sanes, MD  REFERRING PROVIDER: Pincus Sanes, MD   END OF SESSION:     Past Medical History:  Diagnosis Date   Allergy    SEASONAL   Anxiety    BREAST CANCER 1991   left, 1991; B mastectomy   CAD (coronary artery disease)    s/p CABG x5 in 2007   Cataract    BILATERAL-REMOVED   Depression    DIVERTICULOSIS    GERD    History of DVT (deep vein thrombosis)    HYPERLIPIDEMIA    Hypertension    HYPOTHYROIDISM    Occlusion and stenosis of carotid artery without mention of cerebral infarction    OSTEOPOROSIS    S/P TAVR (transcatheter aortic valve replacement) 06/21/2023   s/p TAVR with a 23mm Edwards S3UR via the TF approach by Dr. Clifton James & Dr. Leafy Ro   Severe aortic stenosis    Past Surgical History:  Procedure Laterality Date   APPENDECTOMY     COLONOSCOPY     CORONARY ARTERY BYPASS GRAFT     5        2007   ENDARTERECTOMY FEMORAL Right 06/22/2023   Procedure: RIGHT FEMORAL ENDARTERECTOMY WITH PATCH ANGIOPLASTY;  Surgeon: Nada Libman, MD;  Location: [facility];  Service: Vascular;  Laterality: Right;   FEMORAL-POPLITEAL BYPASS GRAFT Right 06/22/2023   Procedure: FEMORAL THROMBECTOMY;  Surgeon: Nada Libman, MD;  Location: [facility];  Service: Vascular;  Laterality: Right;   INTRAOPERATIVE TRANSTHORACIC ECHOCARDIOGRAM N/A 06/21/2023   Procedure: INTRAOPERATIVE TRANSTHORACIC ECHOCARDIOGRAM;  Surgeon: Kathleene Hazel, MD;  [facility];  Service: Open Heart Surgery;  Laterality: N/A;   MASTECTOMY     left with reconstruction and lymph node excision  1992   MASTECTOMY     right with reconstruction   PATCH ANGIOPLASTY Right 06/22/2023   Procedure: PATCH ANGIOPLASTY OF RIGHT FEMORAL ARTERY USING BOVINE PERICARDIAL PATCH;  Surgeon: Nada Libman, MD;  [facility]: [facility];  Service: Vascular;   Laterality: Right;   REVISION RECONSTRUCTED BREAST Left 02/2014   willard (plastics in [place])   right ankle arthroscopy     RIGHT/LEFT HEART CATH AND CORONARY ANGIOGRAPHY N/A 06/09/2023   Procedure: RIGHT/LEFT HEART CATH AND CORONARY ANGIOGRAPHY;  Surgeon: Kathleene Hazel, MD;  [facility];  Service: Cardiovascular;  Laterality: N/A;   TEAR DUCT CANAL     TONSILLECTOMY AND ADENOIDECTOMY     TRANSCATHETER AORTIC VALVE REPLACEMENT, TRANSFEMORAL N/A 06/21/2023   Procedure: Transcatheter Aortic Valve Replacement, Transfemoral;  Surgeon: Kathleene Hazel, MD;  [facility];  Service: Open Heart Surgery;  Laterality: N/A;   UPPER GASTROINTESTINAL ENDOSCOPY     VAGINAL HYSTERECTOMY     ovaries not excised   Patient Active Problem List   Diagnosis Date Noted   Bradykinesia 02/17/2024   Physical deconditioning 01/25/2024   Abnormal ultrasound 07/27/2023   Lung nodule seen on imaging study 07/27/2023   Fatigue 07/27/2023   Constipation 06/26/2023   S/P TAVR (transcatheter aortic valve replacement) 06/21/2023   CAD (coronary artery disease)    History of CVA (cerebrovascular accident) 10/27/2022   Bilateral sensorineural hearing loss 09/22/2022   Chronic diastolic heart failure (HCC) 08/04/2022   Prediabetes 08/04/2022   Acute left-sided low back pain without sciatica 07/30/2020   Cavus deformity of foot 05/01/2020  Poor balance 05/10/2018   Left ventricular diastolic dysfunction 07/27/2017   Essential hypertension 07/27/2017   Fall 03/02/2017   Urinary incontinence 02/16/2017   Chronic venous insufficiency 08/20/2016   Xiphoid pain 03/05/2016   Insomnia 01/16/2016   Anxiety and depression 12/24/2015   Asthma, mild intermittent, well-controlled 11/24/2015   Carotid artery disease (HCC) 06/29/2011   Hypothyroidism 11/11/2010   Hyperlipidemia 11/11/2010   Senile osteoporosis 11/11/2010   RHINOSINUSITIS, RECURRENT 10/29/2008   Personal history  of malignant neoplasm of breast 03/27/2008   GERD (gastroesophageal reflux disease) 03/27/2008   DIVERTICULOSIS 03/27/2008   Atherosclerosis of native coronary artery of native heart with angina pectoris (HCC) 08/23/2007   Severe aortic stenosis 08/23/2007   DVT 08/23/2007    ONSET DATE: August 2024  REFERRING DIAG: R26.89 (ICD-10-CM) - Poor balance R53.81 (ICD-10-CM) - Physical deconditioning  THERAPY DIAG:  No diagnosis found.  Rationale for Evaluation and Treatment: Rehabilitation  SUBJECTIVE:                                                                                                                                                                                             SUBJECTIVE STATEMENT: Mainly experiencing DOE.  Feeling lack of energy and want to sleep a lot  Pt accompanied by: self  PERTINENT HISTORY: Breast CA s/p B mastectomy, CAD, cataract, depression, GERD, HLD, HTN, osteoporosis, CABG 2007, femoral-popliteal bypass graft 2024  PAIN:  Are you having pain? No   PRECAUTIONS: Fall and Other: osteoporosis  RED FLAGS: None   WEIGHT BEARING RESTRICTIONS: No  FALLS: Has patient fallen in last 6 months? 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  PATIENT GOALS: work on endurance and improve balance   OBJECTIVE:    TODAY'S TREATMENT: 02/23/24 Activity Comments                       TODAY'S TREATMENT: 02/21/24 Activity Comments  Vitals: 170/80 mmHg, 55 bpm, 90-96%, (57 bpm manual)   HEP review   Vitals: 166/85 mmHg, 48 bpm   Feet together EC+head turns HEP addition  Semi-tandem eyes closed x 30 sec HEP addition            TODAY'S TREATMENT: 02/14/24 Activity Comments  Vitals at start of session  97% spO2, 61 bpm   sitting ther-ex: LAQ 2#  2x10 march 2# 2x20 HS curl red TB 10x each  STS with CGA 2x5 (limited eccentric  control) Cueing to keep count/stay on track- pt had a hard time   57-76bpm, 87-94% spO2  HR taken manually and feels irregular     PATIENT EDUCATION: Education details: discussed pt's continued symptoms of SOB and fatigue; discussed pt's challenges with ADLs- she reports doing laundry 1x/month and "eating out a lot" Person educated: Patient Education method: Explanation Education comprehension: verbalized understanding      HOME EXERCISE PROGRAM: Access Code: [email or web address] URL: [email or web address] Date: 01/31/2024 Prepared by: [facility] - Outpatient  Rehab - [facility]  Exercises - Sit to Stand with Counter Support  - 1 x daily - 5 x weekly - 2-3 sets - 5 reps - Heel Raises with Counter Support  - 1 x daily - 7 x weekly - 3 sets - 10 reps - Side Stepping with Counter Support  - 1 x daily - 7 x weekly - 3 sets - 1-2 min rounds hold - Feet Together Balance at [employer] Eyes Closed  - 1 x daily - 7 x weekly - 3 sets - 30 sec hold - Semi-Tandem Balance at [employer] Eyes Open  - 1 x daily - 7 x weekly - 3 sets - 30 sec hold - Backward Walking with Counter Support  - 1 x daily - 7 x weekly - 3 sets - 60 sec hold - Semi-Tandem Balance at [employer] Eyes Closed  - 1 x daily - 7 x weekly - 3 sets - 15-30 sec hold - Corner Balance Feet Together: Eyes Closed With Head Turns  - 1 x daily - 7 x weekly - 3 sets - 3 reps  Note: Objective measures were completed at Evaluation unless otherwise noted.  Vitals: 98% spO2, 64bpm; 134/64 mmHg  DIAGNOSTIC FINDINGS: none recent  COGNITION: Overall cognitive status: Within functional limits for tasks assessed   SENSATION: Reports reduced sensation in B feet  POSTURE: rounded shoulders, forward head, and increased thoracic kyphosis  LOWER EXTREMITY MMT:    MMT (in sitting) Right Eval Left Eval  Hip flexion 3 3+  Hip extension    Hip  abduction 3+ 3+  Hip adduction 3+ 3+  Hip internal rotation    Hip external rotation    Knee flexion 3+ 4-  Knee extension 3+ 4-  Ankle dorsiflexion 3 3  Ankle plantarflexion 2+ 2+  Ankle inversion    Ankle eversion    (Blank rows = not tested)  GAIT: Gait pattern: Very slow, short B step length, occasionally tripping on cane Assistive device utilized: Single point cane Level of assistance: CGA   FUNCTIONAL TESTS:  5 times sit to stand: 47.01 sec using B UEs  94% spO2 10 meter walk test: 43.89 sec with SPC  0.75 ft/sec                                                                                                                               TREATMENT DATE: 01/31/24  GOALS: Goals reviewed with patient? Yes  SHORT TERM GOALS: Target date: 02/21/2024  Patient to be independent with initial HEP. Baseline: HEP initiated; good recall Goal status: MET    LONG TERM GOALS: Target date: 03/13/2024  Patient to be independent with advanced HEP. Baseline: Not yet initiated  Goal status: IN PROGRESS  Patient to demonstrate B LE strength >/=4/5.  Baseline: See above Goal status: IN PROGRESS  Patient to demonstrate gait speed of at least 1.5 ft/sec in order to improve access to community.  Baseline: 0.75 ft/sec Goal status: IN PROGRESS  Patient to demonstrate 5xSTS test in <25 sec in order to decrease risk of falls.  Baseline: 47.01 sec using B UEs Goal status: IN PROGRESS  Patient to score at least 46/56 on Berg in order to decrease risk of falls.  Baseline: 41/56 Goal status: IN PROGRESS  Patient to report plans to participate in home or community exercise program to maintain fitness.  Baseline: not currently participating  Goal status: IN PROGRESS  ASSESSMENT:  CLINICAL IMPRESSION: Notes ongoing general fatigue and DOE.  Vitals reveal elevated BP and bradycardia at rest. Inititaed HEP review with good recall and vitals monitored after activity with no change  to systolic pressure, but HR did elevate to 70 bpm under exertion and no obvious DOE/SOB noted with sats remaining 93-96% w/ exertion.  Addition to HEP for more challenging static standing balance. Continued sessions to progress activities for improved endurance/activity tolerance   OBJECTIVE IMPAIRMENTS: Abnormal gait, decreased activity tolerance, decreased balance, decreased endurance, decreased knowledge of use of DME, decreased strength, decreased safety awareness, and postural dysfunction.   ACTIVITY LIMITATIONS: carrying, lifting, bending, standing, squatting, stairs, transfers, bathing, toileting, dressing, reach over head, hygiene/grooming, and locomotion level  PARTICIPATION LIMITATIONS: meal prep, cleaning, laundry, driving, shopping, community activity, and church  PERSONAL FACTORS: Age, Fitness, Past/current experiences, Time since onset of injury/illness/exacerbation, and 3+ comorbidities: Breast CA s/p B mastectomy, CAD, cataract, depression, GERD, HLD, HTN, osteoporosis, CABG 2007, femoral-popliteal bypass graft 2024  are also affecting patient's functional outcome.   REHAB POTENTIAL: Good  CLINICAL DECISION MAKING: Evolving/moderate complexity  EVALUATION COMPLEXITY: Moderate  PLAN:  PT FREQUENCY: 2x/week  PT DURATION: 6 weeks  PLANNED INTERVENTIONS: 97164- PT Re-evaluation, 97110-Therapeutic exercises, 97530- Therapeutic activity, 97112- Neuromuscular re-education, 97535- Self Care, 16109- Manual therapy, [phone]- Gait training, [phone]- Canalith repositioning, Patient/Family education, Balance training, Stair training, Taping, Dry Needling, Joint mobilization, Spinal mobilization, Vestibular training, Cryotherapy, and Moist heat  PLAN FOR NEXT SESSION: endurance training and balance activities that she can perform safely. May want to edu pt on monitoring her vitals at home and staying within safe HR range

## 2024-02-23 ENCOUNTER — Ambulatory Visit: Admitting: Physical Therapy

## 2024-02-24 ENCOUNTER — Other Ambulatory Visit: Payer: Self-pay | Admitting: Internal Medicine

## 2024-02-27 NOTE — Therapy (Signed)
 OUTPATIENT PHYSICAL THERAPY NEURO TREATMENT   Patient Name: [name] MRN: [id number] DOB:February 03, 1941, 83 y.o., female Today's Date: 02/28/2024   PCP: Pincus Sanes, MD  REFERRING PROVIDER: Pincus Sanes, MD   END OF SESSION:  PT End of Session - 02/28/24 1657     Visit Number 6    Number of Visits 13    Date for PT Re-Evaluation 03/13/24    Authorization Type Medicare/BCBS    Authorization - Number of Visits 60    PT Start Time 1618    PT Stop Time 1656    PT Time Calculation (min) 38 min    Activity Tolerance Patient tolerated treatment well    Behavior During Therapy WFL for tasks assessed/performed               Past Medical History:  Diagnosis Date   Allergy    SEASONAL   Anxiety    BREAST CANCER 1991   left, 1991; B mastectomy   CAD (coronary artery disease)    s/p CABG x5 in 2007   Cataract    BILATERAL-REMOVED   Depression    DIVERTICULOSIS    GERD    History of DVT (deep vein thrombosis)    HYPERLIPIDEMIA    Hypertension    HYPOTHYROIDISM    Occlusion and stenosis of carotid artery without mention of cerebral infarction    OSTEOPOROSIS    S/P TAVR (transcatheter aortic valve replacement) 06/21/2023   s/p TAVR with a 23mm Edwards S3UR via the TF approach by Dr. Clifton James & Dr. Leafy Ro   Severe aortic stenosis    Past Surgical History:  Procedure Laterality Date   APPENDECTOMY     COLONOSCOPY     CORONARY ARTERY BYPASS GRAFT     5        2007   ENDARTERECTOMY FEMORAL Right 06/22/2023   Procedure: RIGHT FEMORAL ENDARTERECTOMY WITH PATCH ANGIOPLASTY;  Surgeon: Nada Libman, MD;  [facility]: [facility];  Service: Vascular;  Laterality: Right;   FEMORAL-POPLITEAL BYPASS GRAFT Right 06/22/2023   Procedure: FEMORAL THROMBECTOMY;  Surgeon: Nada Libman, MD;  [facility]: [facility];  Service: Vascular;  Laterality: Right;   INTRAOPERATIVE TRANSTHORACIC ECHOCARDIOGRAM N/A 06/21/2023   Procedure: INTRAOPERATIVE TRANSTHORACIC ECHOCARDIOGRAM;  Surgeon:  Kathleene Hazel, MD;  [facility];  Service: Open Heart Surgery;  Laterality: N/A;   MASTECTOMY     left with reconstruction and lymph node excision  1992   MASTECTOMY     right with reconstruction   PATCH ANGIOPLASTY Right 06/22/2023   Procedure: PATCH ANGIOPLASTY OF RIGHT FEMORAL ARTERY USING BOVINE PERICARDIAL PATCH;  Surgeon: Nada Libman, MD;  [facility];  Service: Vascular;  Laterality: Right;   REVISION RECONSTRUCTED BREAST Left 02/2014   willard (plastics in [place])   right ankle arthroscopy     RIGHT/LEFT HEART CATH AND CORONARY ANGIOGRAPHY N/A 06/09/2023   Procedure: RIGHT/LEFT HEART CATH AND CORONARY ANGIOGRAPHY;  Surgeon: Kathleene Hazel, MD;  [facility];  Service: Cardiovascular;  Laterality: N/A;   TEAR DUCT CANAL     TONSILLECTOMY AND ADENOIDECTOMY     TRANSCATHETER AORTIC VALVE REPLACEMENT, TRANSFEMORAL N/A 06/21/2023   Procedure: Transcatheter Aortic Valve Replacement, Transfemoral;  Surgeon: Kathleene Hazel, MD;  [facility];  Service: Open Heart Surgery;  Laterality: N/A;   UPPER GASTROINTESTINAL ENDOSCOPY     VAGINAL HYSTERECTOMY     ovaries not excised   Patient Active Problem List  Diagnosis Date Noted   Bradykinesia 02/17/2024   Physical deconditioning 01/25/2024   Abnormal ultrasound 07/27/2023   Lung nodule seen on imaging study 07/27/2023   Fatigue 07/27/2023   Constipation 06/26/2023   S/P TAVR (transcatheter aortic valve replacement) 06/21/2023   CAD (coronary artery disease)    History of CVA (cerebrovascular accident) 10/27/2022   Bilateral sensorineural hearing loss 09/22/2022   Chronic diastolic heart failure (HCC) 08/04/2022   Prediabetes 08/04/2022   Acute left-sided low back pain without sciatica 07/30/2020   Cavus deformity of foot 05/01/2020   Poor balance 05/10/2018   Left ventricular diastolic dysfunction 07/27/2017   Essential hypertension 07/27/2017   Fall  03/02/2017   Urinary incontinence 02/16/2017   Chronic venous insufficiency 08/20/2016   Xiphoid pain 03/05/2016   Insomnia 01/16/2016   Anxiety and depression 12/24/2015   Asthma, mild intermittent, well-controlled 11/24/2015   Carotid artery disease (HCC) 06/29/2011   Hypothyroidism 11/11/2010   Hyperlipidemia 11/11/2010   Senile osteoporosis 11/11/2010   RHINOSINUSITIS, RECURRENT 10/29/2008   Personal history of malignant neoplasm of breast 03/27/2008   GERD (gastroesophageal reflux disease) 03/27/2008   DIVERTICULOSIS 03/27/2008   Atherosclerosis of native coronary artery of native heart with angina pectoris (HCC) 08/23/2007   Severe aortic stenosis 08/23/2007   DVT 08/23/2007    ONSET DATE: August 2024  REFERRING DIAG: R26.89 (ICD-10-CM) - Poor balance R53.81 (ICD-10-CM) - Physical deconditioning  THERAPY DIAG:  Muscle weakness (generalized)  Difficulty in walking, not elsewhere classified  Unsteadiness on feet  Other symptoms and signs involving the musculoskeletal system  Rationale for Evaluation and Treatment: Rehabilitation  SUBJECTIVE:                                                                                                                                                                                             SUBJECTIVE STATEMENT: I saw Dr. Donnette Gal and she changed one of my heart meds. Reports that she has been feeling the same. "Still feeling very weak, all I do is sleep."  Pt accompanied by: self  PERTINENT HISTORY: Breast CA s/p B mastectomy, CAD, cataract, depression, GERD, HLD, HTN, osteoporosis, CABG 2007, femoral-popliteal bypass graft 2024  PAIN:  Are you having pain? No   PRECAUTIONS: Fall and Other: osteoporosis  RED FLAGS: None   WEIGHT BEARING RESTRICTIONS: No  FALLS: Has patient fallen in last 6 months? 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  PATIENT GOALS: work  on endurance and improve balance   OBJECTIVE:     TODAY'S TREATMENT: 02/28/24 Activity Comments  Vitals at start of session 98% spO2, 53bpm, 157/71 mmHg   Exercises below were performed in II bars:   sidestepping  x 1 min With 2# x 1 min  93% 54 bpm  standing march 2# 2x20 94%, 52 bpm  standing HS curl 2# 2x10   standing hip ABD 2# 2x10 86-96% spO2,  54 bpm   mini squats 2x10  Excessive anterior translation of tibias, required cueing for proper form     PATIENT EDUCATION: Education details: answered pt's questions; edu on use of ankle weights for exercise Person educated: Patient Education method: Explanation Education comprehension: verbalized understanding     HOME EXERCISE PROGRAM: Access Code: [email or web address] URL: [email or web address] Date: 01/31/2024 Prepared by: [facility] - Outpatient  Rehab - [facility]  Exercises - Sit to Stand with Counter Support  - 1 x daily - 5 x weekly - 2-3 sets - 5 reps - Heel Raises with Counter Support  - 1 x daily - 7 x weekly - 3 sets - 10 reps - Side Stepping with Counter Support  - 1 x daily - 7 x weekly - 3 sets - 1-2 min rounds hold - Feet Together Balance at [employer] Eyes Closed  - 1 x daily - 7 x weekly - 3 sets - 30 sec hold - Semi-Tandem Balance at [employer] Eyes Open  - 1 x daily - 7 x weekly - 3 sets - 30 sec hold - Backward Walking with Counter Support  - 1 x daily - 7 x weekly - 3 sets - 60 sec hold - Semi-Tandem Balance at [employer] Eyes Closed  - 1 x daily - 7 x weekly - 3 sets - 15-30 sec hold - Corner Balance Feet Together: Eyes Closed With Head Turns  - 1 x daily - 7 x weekly - 3 sets - 3 reps    Note: Objective measures were completed at Evaluation unless otherwise noted.  Vitals: 98% spO2, 64bpm; 134/64 mmHg  DIAGNOSTIC FINDINGS: none  recent  COGNITION: Overall cognitive status: Within functional limits for tasks assessed   SENSATION: Reports reduced sensation in B feet  POSTURE: rounded shoulders, forward head, and increased thoracic kyphosis  LOWER EXTREMITY MMT:    MMT (in sitting) Right Eval Left Eval  Hip flexion 3 3+  Hip extension    Hip abduction 3+ 3+  Hip adduction 3+ 3+  Hip internal rotation    Hip external rotation    Knee flexion 3+ 4-  Knee extension 3+ 4-  Ankle dorsiflexion 3 3  Ankle plantarflexion 2+ 2+  Ankle inversion    Ankle eversion    (Blank rows = not tested)  GAIT: Gait pattern: Very slow, short B step length, occasionally tripping on cane Assistive device utilized: Single point cane Level of assistance: CGA   FUNCTIONAL TESTS:  5 times sit to stand: 47.01 sec using B UEs  94% spO2 10 meter walk test: 43.89 sec with SPC  0.75 ft/sec  TREATMENT DATE: 01/31/24      GOALS: Goals reviewed with patient? Yes  SHORT TERM GOALS: Target date: 02/21/2024  Patient to be independent with initial HEP. Baseline: HEP initiated; good recall Goal status: MET    LONG TERM GOALS: Target date: 03/13/2024  Patient to be independent with advanced HEP. Baseline: Not yet initiated  Goal status: IN PROGRESS  Patient to demonstrate B LE strength >/=4/5.  Baseline: See above Goal status: IN PROGRESS  Patient to demonstrate gait speed of at least 1.5 ft/sec in order to improve access to community.  Baseline: 0.75 ft/sec Goal status: IN PROGRESS  Patient to demonstrate 5xSTS test in <25 sec in order to decrease risk of falls.  Baseline: 47.01 sec using B UEs Goal status: IN PROGRESS  Patient to score at least 46/56 on Berg in order to decrease risk of falls.  Baseline: 41/56 Goal status: IN PROGRESS  Patient to report plans to participate in home or  community exercise program to maintain fitness.  Baseline: not currently participating  Goal status: IN PROGRESS  ASSESSMENT:  CLINICAL IMPRESSION: Patient arrived to session with report of some medication changes made by her PCP but reports continued fatigue. Vitals at start of session revealed remaining elevating systolic BP but WFL and bradycardia. Patient was able to perform more standing activity today compared to past sessions, without the need for as many/as long of rest breaks. Vitals and symptoms were monitored throughout. Patient did appear to have drop in O2 saturation with activity, however did not appear to be any more fatigued or SOB after completing these activities, thus these values may not be accurate. Overall, patient tolerated session with improved endurance and tolerance. no complaints upon leaving.    OBJECTIVE IMPAIRMENTS: Abnormal gait, decreased activity tolerance, decreased balance, decreased endurance, decreased knowledge of use of DME, decreased strength, decreased safety awareness, and postural dysfunction.   ACTIVITY LIMITATIONS: carrying, lifting, bending, standing, squatting, stairs, transfers, bathing, toileting, dressing, reach over head, hygiene/grooming, and locomotion level  PARTICIPATION LIMITATIONS: meal prep, cleaning, laundry, driving, shopping, community activity, and church  PERSONAL FACTORS: Age, Fitness, Past/current experiences, Time since onset of injury/illness/exacerbation, and 3+ comorbidities: Breast CA s/p B mastectomy, CAD, cataract, depression, GERD, HLD, HTN, osteoporosis, CABG 2007, femoral-popliteal bypass graft 2024  are also affecting patient's functional outcome.   REHAB POTENTIAL: Good  CLINICAL DECISION MAKING: Evolving/moderate complexity  EVALUATION COMPLEXITY: Moderate  PLAN:  PT FREQUENCY: 2x/week  PT DURATION: 6 weeks  PLANNED INTERVENTIONS: 97164- PT Re-evaluation, 97110-Therapeutic exercises, 97530- Therapeutic  activity, 97112- Neuromuscular re-education, 97535- Self Care, 40981- Manual therapy, [phone]- Gait training, [phone]- Canalith repositioning, Patient/Family education, Balance training, Stair training, Taping, Dry Needling, Joint mobilization, Spinal mobilization, Vestibular training, Cryotherapy, and Moist heat  PLAN FOR NEXT SESSION: endurance training and balance activities that she can perform safely. May want to edu pt on monitoring her vitals at home and staying within safe HR range    [name], [place], DPT 02/28/24 4:58 PM  [facility] at [facility] [address] [place], [address] [place], [place] [address] Phone # [phone] Fax # [phone]

## 2024-02-28 ENCOUNTER — Ambulatory Visit: Admitting: Physical Therapy

## 2024-02-28 ENCOUNTER — Encounter: Payer: Self-pay | Admitting: Physical Therapy

## 2024-02-28 DIAGNOSIS — M6281 Muscle weakness (generalized): Secondary | ICD-10-CM

## 2024-02-28 DIAGNOSIS — R2681 Unsteadiness on feet: Secondary | ICD-10-CM

## 2024-02-28 DIAGNOSIS — R262 Difficulty in walking, not elsewhere classified: Secondary | ICD-10-CM

## 2024-02-28 DIAGNOSIS — R29898 Other symptoms and signs involving the musculoskeletal system: Secondary | ICD-10-CM

## 2024-02-28 NOTE — Therapy (Signed)
 OUTPATIENT PHYSICAL THERAPY NEURO TREATMENT   Patient Name: [name] MRN: [id number] DOB:21-Nov-1940, 83 y.o., female Today's Date: 03/01/2024   PCP: Colene Dauphin, MD  REFERRING PROVIDER: Colene Dauphin, MD   END OF SESSION:  PT End of Session - 03/01/24 1659     Visit Number 7    Number of Visits 13    Date for PT Re-Evaluation 03/13/24    Authorization Type Medicare/BCBS    Authorization - Number of Visits 60    PT Start Time 1623    PT Stop Time 1701    PT Time Calculation (min) 38 min    Activity Tolerance Patient tolerated treatment well;Patient limited by fatigue    Behavior During Therapy [facility] for tasks assessed/performed               Past Medical History:  Diagnosis Date   Allergy    SEASONAL   Anxiety    BREAST CANCER 1991   left, 1991; B mastectomy   CAD (coronary artery disease)    s/p CABG x5 in 2007   Cataract    BILATERAL-REMOVED   Depression    DIVERTICULOSIS    GERD    History of DVT (deep vein thrombosis)    HYPERLIPIDEMIA    Hypertension    HYPOTHYROIDISM    Occlusion and stenosis of carotid artery without mention of cerebral infarction    OSTEOPOROSIS    S/P TAVR (transcatheter aortic valve replacement) 06/21/2023   s/p TAVR with a 23mm Edwards S3UR via the TF approach by Dr. Abel Hoe & Dr. Honey Lusty   Severe aortic stenosis    Past Surgical History:  Procedure Laterality Date   APPENDECTOMY     COLONOSCOPY     CORONARY ARTERY BYPASS GRAFT     5        2007   ENDARTERECTOMY FEMORAL Right 06/22/2023   Procedure: RIGHT FEMORAL ENDARTERECTOMY WITH PATCH ANGIOPLASTY;  Surgeon: Margherita Shell, MD;  Location: [facility] OR;  Service: Vascular;  Laterality: Right;   FEMORAL-POPLITEAL BYPASS GRAFT Right 06/22/2023   Procedure: FEMORAL THROMBECTOMY;  Surgeon: Margherita Shell, MD;  Location: [facility] OR;  Service: Vascular;  Laterality: Right;   INTRAOPERATIVE TRANSTHORACIC ECHOCARDIOGRAM N/A 06/21/2023   Procedure: INTRAOPERATIVE TRANSTHORACIC  ECHOCARDIOGRAM;  Surgeon: Odie Benne, MD;  Location: [facility] INVASIVE CV LAB;  Service: Open Heart Surgery;  Laterality: N/A;   MASTECTOMY     left with reconstruction and lymph node excision  1992   MASTECTOMY     right with reconstruction   PATCH ANGIOPLASTY Right 06/22/2023   Procedure: PATCH ANGIOPLASTY OF RIGHT FEMORAL ARTERY USING BOVINE PERICARDIAL PATCH;  Surgeon: Margherita Shell, MD;  Location: [facility] OR;  Service: Vascular;  Laterality: Right;   REVISION RECONSTRUCTED BREAST Left 02/2014   willard (plastics in [place])   right ankle arthroscopy     RIGHT/LEFT HEART CATH AND CORONARY ANGIOGRAPHY N/A 06/09/2023   Procedure: RIGHT/LEFT HEART CATH AND CORONARY ANGIOGRAPHY;  Surgeon: Odie Benne, MD;  Location: [facility] INVAS[facility] CV LAB;  Service: Cardiovascular;  Laterality: N/A;   TEAR DUCT CANAL     TONSILLECTOMY AND ADENOIDECTOMY     TRANSCATHETER AORTIC VALVE REPLACEMENT, TRANSFEMORAL N/A 06/21/2023   Procedure: Transcatheter Aortic Valve Replacement, Transfemoral;  Surgeon: Odie Benne, MD;  Location: [facility] INVAS[facility] CV [facility];  Service: Open Heart Surgery;  Laterality: N/A;   UPPER GASTROINTESTINAL ENDOSCOPY     VAGINAL HYSTERECTOMY     ovaries not excised   Patient Active  Problem List   Diagnosis Date Noted   Bradykinesia 02/17/2024   Physical deconditioning 01/25/2024   Abnormal ultrasound 07/27/2023   Lung nodule seen on imaging study 07/27/2023   Fatigue 07/27/2023   Constipation 06/26/2023   S/P TAVR (transcatheter aortic valve replacement) 06/21/2023   CAD (coronary artery disease)    History of CVA (cerebrovascular accident) 10/27/2022   Bilateral sensorineural hearing loss 09/22/2022   Chronic diastolic heart failure (HCC) 08/04/2022   Prediabetes 08/04/2022   Acute left-sided low back pain without sciatica 07/30/2020   Cavus deformity of foot 05/01/2020   Poor balance 05/10/2018   Left ventricular diastolic dysfunction 07/27/2017   Essential hypertension  07/27/2017   Fall 03/02/2017   Urinary incontinence 02/16/2017   Chronic venous insufficiency 08/20/2016   Xiphoid pain 03/05/2016   Insomnia 01/16/2016   Anxiety and depression 12/24/2015   Asthma, mild intermittent, well-controlled 11/24/2015   Carotid artery disease (HCC) 06/29/2011   Hypothyroidism 11/11/2010   Hyperlipidemia 11/11/2010   Senile osteoporosis 11/11/2010   RHINOSINUSITIS, RECURRENT 10/29/2008   Personal history of malignant neoplasm of breast 03/27/2008   GERD (gastroesophageal reflux disease) 03/27/2008   DIVERTICULOSIS 03/27/2008   Atherosclerosis of native coronary artery of native heart with angina pectoris (HCC) 08/23/2007   Severe aortic stenosis 08/23/2007   DVT 08/23/2007    ONSET DATE: August 2024  REFERRING DIAG: R26.89 (ICD-10-CM) - Poor balance R53.81 (ICD-10-CM) - Physical deconditioning  THERAPY DIAG:  Muscle weakness (generalized)  Difficulty in walking, not elsewhere classified  Unsteadiness on feet  Other symptoms and signs involving the musculoskeletal system  Rationale for Evaluation and Treatment: Rehabilitation  SUBJECTIVE:                                                                                                                                                                                             SUBJECTIVE STATEMENT: "Nothing new. Just tired still."  Pt accompanied by: self  PERTINENT HISTORY: Breast CA s/p B mastectomy, CAD, cataract, depression, GERD, HLD, HTN, osteoporosis, CABG 2007, femoral-popliteal bypass graft 2024  PAIN:  Are you having pain? No   PRECAUTIONS: Fall and Other: osteoporosis  RED FLAGS: None   WEIGHT BEARING RESTRICTIONS: No  FALLS: Has patient fallen in last 6 months? No  LIVING ENVIRONMENT: Lives with: lives alone Lives in: Other townhome Stairs: 7 steps to enter with railing Has following equipment at home: Single point cane, Environmental consultant - 2 wheeled, and Grab bars  PLOF:  Independent; monthly cleaning service  PATIENT GOALS: work on endurance and improve balance   OBJECTIVE:     TODAY'S TREATMENT: 03/01/24 Activity Comments  Vitals at  start of session  159/81 57bpm 98%  STS + overhead red medball reach  Unable to perform d/t weakness  STS from airex + overhead yellow medball reach  Unable to perform d/t weakness  STS from airex + overhead  reach 2x3  Better tolerance/success  1st set: 84% 56bpm  Went back up to 98% in seconds   2nd set: 96% spO2, 58 bpm   in II bars:   sidestepping over cones 2x 1 min marching walk x 1 min 92% 68bpm. Standing rest breaks in between           PATIENT EDUCATION: Education details: answered pt's questions on exercise's effects on LE circulation  Person educated: Patient Education method: Explanation Education comprehension: verbalized understanding     HOME EXERCISE PROGRAM: Access Code: [email or web address] URL: [email or web address] Date: 01/31/2024 Prepared by: [facility] - Outpatient  Rehab - [facility]  Exercises - Sit to Stand with Counter Support  - 1 x daily - 5 x weekly - 2-3 sets - 5 reps - Heel Raises with Counter Support  - 1 x daily - 7 x weekly - 3 sets - 10 reps - Side Stepping with Counter Support  - 1 x daily - 7 x weekly - 3 sets - 1-2 min rounds hold - Feet Together Balance at [employer] Eyes Closed  - 1 x daily - 7 x weekly - 3 sets - 30 sec hold - Semi-Tandem Balance at [employer] Eyes Open  - 1 x daily - 7 x weekly - 3 sets - 30 sec hold - Backward Walking with Counter Support  - 1 x daily - 7 x weekly - 3 sets - 60 sec hold - Semi-Tandem Balance at [employer] Eyes Closed  - 1 x daily - 7 x weekly - 3 sets - 15-30 sec hold - Corner Balance Feet Together: Eyes Closed With Head Turns  - 1 x daily - 7 x weekly - 3 sets - 3 reps  Note: Objective measures were completed at Evaluation unless otherwise noted.  Vitals: 98% spO2, 64bpm; 134/64 mmHg  DIAGNOSTIC FINDINGS: none  recent  COGNITION: Overall cognitive status: Within functional limits for tasks assessed   SENSATION: Reports reduced sensation in B feet  POSTURE: rounded shoulders, forward head, and increased thoracic kyphosis  LOWER EXTREMITY MMT:    MMT (in sitting) Right Eval Left Eval  Hip flexion 3 3+  Hip extension    Hip abduction 3+ 3+  Hip adduction 3+ 3+  Hip internal rotation    Hip external rotation    Knee flexion 3+ 4-  Knee extension 3+ 4-  Ankle dorsiflexion 3 3  Ankle plantarflexion 2+ 2+  Ankle inversion    Ankle eversion    (Blank rows = not tested)  GAIT: Gait pattern: Very slow, short B step length, occasionally tripping on cane Assistive device utilized: Single point cane Level of assistance: CGA   FUNCTIONAL TESTS:  5 times sit to stand: 47.01 sec using B UEs  94% spO2 10 meter walk test: 43.89 sec with SPC  0.75 ft/sec  TREATMENT DATE: 01/31/24      GOALS: Goals reviewed with patient? Yes  SHORT TERM GOALS: Target date: 02/21/2024  Patient to be independent with initial HEP. Baseline: HEP initiated; good recall Goal status: MET    LONG TERM GOALS: Target date: 03/13/2024  Patient to be independent with advanced HEP. Baseline: Not yet initiated  Goal status: IN PROGRESS  Patient to demonstrate B LE strength >/=4/5.  Baseline: See above Goal status: IN PROGRESS  Patient to demonstrate gait speed of at least 1.5 ft/sec in order to improve access to community.  Baseline: 0.75 ft/sec Goal status: IN PROGRESS  Patient to demonstrate 5xSTS test in <25 sec in order to decrease risk of falls.  Baseline: 47.01 sec using B UEs Goal status: IN PROGRESS  Patient to score at least 46/56 on Berg in order to decrease risk of falls.  Baseline: 41/56 Goal status: IN PROGRESS  Patient to report plans to participate in home or  community exercise program to maintain fitness.  Baseline: not currently participating  Goal status: IN PROGRESS  ASSESSMENT:  CLINICAL IMPRESSION: Patient arrived to session with ongoing c/o fatigue. Vitals showed elevated systolic BP similar to previous session, but safe for exercise. Patient appeared more fatigue compared to previous session today and exercises were modified according to today's abilities. HR and SpO2 were monitored throughout- overall this was well-maintained. Patient reported no change in fatigue upon leaving.    OBJECTIVE IMPAIRMENTS: Abnormal gait, decreased activity tolerance, decreased balance, decreased endurance, decreased knowledge of use of DME, decreased strength, decreased safety awareness, and postural dysfunction.   ACTIVITY LIMITATIONS: carrying, lifting, bending, standing, squatting, stairs, transfers, bathing, toileting, dressing, reach over head, hygiene/grooming, and locomotion level  PARTICIPATION LIMITATIONS: meal prep, cleaning, laundry, driving, shopping, community activity, and church  PERSONAL FACTORS: Age, Fitness, Past/current experiences, Time since onset of injury/illness/exacerbation, and 3+ comorbidities: Breast CA s/p B mastectomy, CAD, cataract, depression, GERD, HLD, HTN, osteoporosis, CABG 2007, femoral-popliteal bypass graft 2024  are also affecting patient's functional outcome.   REHAB POTENTIAL: Good  CLINICAL DECISION MAKING: Evolving/moderate complexity  EVALUATION COMPLEXITY: Moderate  PLAN:  PT FREQUENCY: 2x/week  PT DURATION: 6 weeks  PLANNED INTERVENTIONS: 97164- PT Re-evaluation, 97110-Therapeutic exercises, 97530- Therapeutic activity, 97112- Neuromuscular re-education, 97535- Self Care, 78295- Manual therapy, [phone]- Gait training, [phone]- Canalith repositioning, Patient/Family education, Balance training, Stair training, Taping, Dry Needling, Joint mobilization, Spinal mobilization, Vestibular training, Cryotherapy, and  Moist heat  PLAN FOR NEXT SESSION: endurance training and balance activities that she can perform safely. May want to edu pt on monitoring her vitals at home and staying within safe HR range   [name], [place], DPT 03/01/24 5:05 PM  [facility] at [facility] [address] [place], [address] [place], [place] [address] Phone # [phone] Fax # [phone]

## 2024-03-01 ENCOUNTER — Ambulatory Visit: Admitting: Physical Therapy

## 2024-03-01 ENCOUNTER — Encounter: Payer: Self-pay | Admitting: Physical Therapy

## 2024-03-01 DIAGNOSIS — R2681 Unsteadiness on feet: Secondary | ICD-10-CM | POA: Diagnosis not present

## 2024-03-01 DIAGNOSIS — M6281 Muscle weakness (generalized): Secondary | ICD-10-CM

## 2024-03-01 DIAGNOSIS — R262 Difficulty in walking, not elsewhere classified: Secondary | ICD-10-CM

## 2024-03-01 DIAGNOSIS — R29898 Other symptoms and signs involving the musculoskeletal system: Secondary | ICD-10-CM

## 2024-03-05 ENCOUNTER — Other Ambulatory Visit: Payer: Self-pay | Admitting: Internal Medicine

## 2024-03-05 NOTE — Therapy (Signed)
 OUTPATIENT PHYSICAL THERAPY NEURO TREATMENT   Patient Name: [name] MRN: [id number] DOB:01/20/1941, 83 y.o., female Today's Date: 03/06/2024   PCP: Colene Dauphin, MD  REFERRING PROVIDER: Colene Dauphin, MD   END OF SESSION:  PT End of Session - 03/06/24 1700     Visit Number 8    Number of Visits 13    Date for PT Re-Evaluation 03/13/24    Authorization Type Medicare/BCBS    Authorization - Number of Visits 60    PT Start Time 1618    PT Stop Time 1659    PT Time Calculation (min) 41 min    Activity Tolerance Patient tolerated treatment well    Behavior During Therapy WFL for tasks assessed/performed                Past Medical History:  Diagnosis Date   Allergy     SEASONAL   Anxiety    BREAST CANCER 1991   left, 1991; B mastectomy   CAD (coronary artery disease)    s/p CABG x5 in 2007   Cataract    BILATERAL-REMOVED   Depression    DIVERTICULOSIS    GERD    History of DVT (deep vein thrombosis)    HYPERLIPIDEMIA    Hypertension    HYPOTHYROIDISM    Occlusion and stenosis of carotid artery without mention of cerebral infarction    OSTEOPOROSIS    S/P TAVR (transcatheter aortic valve replacement) 06/21/2023   s/p TAVR with a 23mm Edwards S3UR via the TF approach by Dr. Abel Hoe & Dr. Honey Lusty   Severe aortic stenosis    Past Surgical History:  Procedure Laterality Date   APPENDECTOMY     COLONOSCOPY     CORONARY ARTERY BYPASS GRAFT     5        2007   ENDARTERECTOMY FEMORAL Right 06/22/2023   Procedure: RIGHT FEMORAL ENDARTERECTOMY WITH PATCH ANGIOPLASTY;  Surgeon: Margherita Shell, MD;  Location: [facility] OR;  Service: Vascular;  Laterality: Right;   FEMORAL-POPLITEAL BYPASS GRAFT Right 06/22/2023   Procedure: FEMORAL THROMBECTOMY;  Surgeon: Margherita Shell, MD;  Location: [facility] OR;  Service: Vascular;  Laterality: Right;   INTRAOPERATIVE TRANSTHORACIC ECHOCARDIOGRAM N/A 06/21/2023   Procedure: INTRAOPERATIVE TRANSTHORACIC ECHOCARDIOGRAM;  Surgeon:  Odie Benne, MD;  Location: [facility];  Service: Open Heart Surgery;  Laterality: N/A;   MASTECTOMY     left with reconstruction and lymph node excision  1992   MASTECTOMY     right with reconstruction   PATCH ANGIOPLASTY Right 06/22/2023   Procedure: PATCH ANGIOPLASTY OF RIGHT FEMORAL ARTERY USING BOVINE PERICARDIAL PATCH;  Surgeon: Margherita Shell, MD;  Location: [facility];  Service: Vascular;  Laterality: Right;   REVISION RECONSTRUCTED BREAST Left 02/2014   willard (plastics in [place])   right ankle arthroscopy     RIGHT/LEFT HEART CATH AND CORONARY ANGIOGRAPHY N/A 06/09/2023   Procedure: RIGHT/LEFT HEART CATH AND CORONARY ANGIOGRAPHY;  Surgeon: Odie Benne, MD;  Location: [facility];  Service: Cardiovascular;  Laterality: N/A;   TEAR DUCT CANAL     TONSILLECTOMY AND ADENOIDECTOMY     TRANSCATHETER AORTIC VALVE REPLACEMENT, TRANSFEMORAL N/A 06/21/2023   Procedure: Transcatheter Aortic Valve Replacement, Transfemoral;  Surgeon: Odie Benne, MD;  Location: [facility];  Service: Open Heart Surgery;  Laterality: N/A;   UPPER GASTROINTESTINAL ENDOSCOPY     VAGINAL HYSTERECTOMY     ovaries not excised   Patient Active Problem List  Diagnosis Date Noted   Bradykinesia 02/17/2024   Physical deconditioning 01/25/2024   Abnormal ultrasound 07/27/2023   Lung nodule seen on imaging study 07/27/2023   Fatigue 07/27/2023   Constipation 06/26/2023   S/P TAVR (transcatheter aortic valve replacement) 06/21/2023   CAD (coronary artery disease)    History of CVA (cerebrovascular accident) 10/27/2022   Bilateral sensorineural hearing loss 09/22/2022   Chronic diastolic heart failure (HCC) 08/04/2022   Prediabetes 08/04/2022   Acute left-sided low back pain without sciatica 07/30/2020   Cavus deformity of foot 05/01/2020   Poor balance 05/10/2018   Left ventricular diastolic dysfunction 07/27/2017   Essential hypertension 07/27/2017   Fall  03/02/2017   Urinary incontinence 02/16/2017   Chronic venous insufficiency 08/20/2016   Xiphoid pain 03/05/2016   Insomnia 01/16/2016   Anxiety and depression 12/24/2015   Asthma, mild intermittent, well-controlled 11/24/2015   Carotid artery disease (HCC) 06/29/2011   Hypothyroidism 11/11/2010   Hyperlipidemia 11/11/2010   Senile osteoporosis 11/11/2010   RHINOSINUSITIS, RECURRENT 10/29/2008   Personal history of malignant neoplasm of breast 03/27/2008   GERD (gastroesophageal reflux disease) 03/27/2008   DIVERTICULOSIS 03/27/2008   Atherosclerosis of native coronary artery of native heart with angina pectoris (HCC) 08/23/2007   Severe aortic stenosis 08/23/2007   DVT 08/23/2007    ONSET DATE: August 2024  REFERRING DIAG: R26.89 (ICD-10-CM) - Poor balance R53.81 (ICD-10-CM) - Physical deconditioning  THERAPY DIAG:  Muscle weakness (generalized)  Difficulty in walking, not elsewhere classified  Unsteadiness on feet  Other symptoms and signs involving the musculoskeletal system  Rationale for Evaluation and Treatment: Rehabilitation  SUBJECTIVE:                                                                                                                                                                                             SUBJECTIVE STATEMENT: Have been checking my BP at home and it was low, but not too low. Denies dizziness or wooziness. Has questions about 4WW.   Pt accompanied by: self  PERTINENT HISTORY: Breast CA s/p B mastectomy, CAD, cataract, depression, GERD, HLD, HTN, osteoporosis, CABG 2007, femoral-popliteal bypass graft 2024  PAIN:  Are you having pain? No   PRECAUTIONS: Fall and Other: osteoporosis  RED FLAGS: None   WEIGHT BEARING RESTRICTIONS: No  FALLS: Has patient fallen in last 6 months? No  LIVING ENVIRONMENT: Lives with: lives alone Lives in: Other townhome Stairs: 7 steps to enter with railing Has following equipment at home:  Single point cane, Environmental consultant - 2 wheeled, and Grab bars  PLOF: Independent; monthly cleaning service  PATIENT GOALS: work on endurance and improve balance  OBJECTIVE:      TODAY'S TREATMENT: 03/06/24 Activity Comments  Vitals at start of session 94% spO2, 60bpm  Gait training with 4WW and RW Good stability and posture.  Discussed pros/cons of 4WW and RW  Pt unable to easily lift either one d/t weakness   Sitting ther-ex for UE strengthening: Biceps curls 2x10 Single UE triceps extensions 2x10 Rows 2x10  Yellow TB  Very limited ROM with biceps curls d/t weakness. No pain  Vitals recheck  96% spO2, 55bpm        HOME EXERCISE PROGRAM Last updated: 03/06/24 Access Code: [email or web address] URL: [email or web address] Date: 03/06/2024 Prepared by: [facility] - Outpatient  Rehab - [facility]  Exercises - Sit to Stand with Counter Support  - 1 x daily - 5 x weekly - 2-3 sets - 5 reps - Heel Raises with Counter Support  - 1 x daily - 7 x weekly - 3 sets - 10 reps - Side Stepping with Counter Support  - 1 x daily - 7 x weekly - 3 sets - 1-2 min rounds hold - Feet Together Balance at [employer] Eyes Closed  - 1 x daily - 7 x weekly - 3 sets - 30 sec hold - Semi-Tandem Balance at [employer] Eyes Open  - 1 x daily - 7 x weekly - 3 sets - 30 sec hold - Backward Walking with Counter Support  - 1 x daily - 7 x weekly - 3 sets - 60 sec hold - Semi-Tandem Balance at [employer] Eyes Closed  - 1 x daily - 7 x weekly - 3 sets - 15-30 sec hold - Corner Balance Feet Together: Eyes Closed With Head Turns  - 1 x daily - 7 x weekly - 3 sets - 3 reps - Seated Bilateral Elbow Flexion with Resistance on Swiss Ball  - 1 x daily - 3 x weekly - 2 sets - 10 reps - Seated Elbow Extension with Self-Anchored Resistance  - 1 x daily - 3 x weekly - 2 sets - 10 reps - Standing Shoulder Row with Anchored Resistance  - 1 x daily - 3 x weekly - 2 sets - 10 reps  PATIENT EDUCATION: Education details:  edu on recommended frequency of strengthening activities, HEP update ; edu on benefits on ST d/t pt's concerns about low voice volume , POC Person educated: Patient Education method: Explanation, Demonstration, Tactile cues, Verbal cues, and Handouts Education comprehension: verbalized understanding and returned demonstration    Note: Objective measures were completed at Evaluation unless otherwise noted.  Vitals: 98% spO2, 64bpm; 134/64 mmHg  DIAGNOSTIC FINDINGS: none recent  COGNITION: Overall cognitive status: Within functional limits for tasks assessed   SENSATION: Reports reduced sensation in B feet  POSTURE: rounded shoulders, forward head, and increased thoracic kyphosis  LOWER EXTREMITY MMT:    MMT (in sitting) Right Eval Left Eval  Hip flexion 3 3+  Hip extension    Hip abduction 3+ 3+  Hip adduction 3+ 3+  Hip internal rotation    Hip external rotation    Knee flexion 3+ 4-  Knee extension 3+ 4-  Ankle dorsiflexion 3 3  Ankle plantarflexion 2+ 2+  Ankle inversion    Ankle eversion    (Blank rows = not tested)  GAIT: Gait pattern: Very slow, short B step length, occasionally tripping on cane Assistive device utilized: Single point cane Level of assistance: CGA   FUNCTIONAL TESTS:  5 times sit to stand: 47.01 sec using B UEs  94% spO2 10 meter walk test: 43.89 sec with SPC  0.75 ft/sec                                                                                                                               TREATMENT DATE: 01/31/24      GOALS: Goals reviewed with patient? Yes  SHORT TERM GOALS: Target date: 02/21/2024  Patient to be independent with initial HEP. Baseline: HEP initiated; good recall Goal status: MET    LONG TERM GOALS: Target date: 03/13/2024  Patient to be independent with advanced HEP. Baseline: Not yet initiated  Goal status: IN PROGRESS  Patient to demonstrate B LE strength >/=4/5.  Baseline: See above Goal status:  IN PROGRESS  Patient to demonstrate gait speed of at least 1.5 ft/sec in order to improve access to community.  Baseline: 0.75 ft/sec Goal status: IN PROGRESS  Patient to demonstrate 5xSTS test in <25 sec in order to decrease risk of falls.  Baseline: 47.01 sec using B UEs Goal status: IN PROGRESS  Patient to score at least 46/56 on Berg in order to decrease risk of falls.  Baseline: 41/56 Goal status: IN PROGRESS  Patient to report plans to participate in home or community exercise program to maintain fitness.  Baseline: not currently participating  Goal status: IN PROGRESS  ASSESSMENT:  CLINICAL IMPRESSION:   Patient arrived to session without changes. Trialed gait training and educated on 4WW vs. RW. Patient remarking on limited lifting ability with even lighter RW, thus session focused on UE strengthening to allow for functional lifting tasks. HEP was updated with these activities and patient reported understanding. No complaints upon leaving.    OBJECTIVE IMPAIRMENTS: Abnormal gait, decreased activity tolerance, decreased balance, decreased endurance, decreased knowledge of use of DME, decreased strength, decreased safety awareness, and postural dysfunction.   ACTIVITY LIMITATIONS: carrying, lifting, bending, standing, squatting, stairs, transfers, bathing, toileting, dressing, reach over head, hygiene/grooming, and locomotion level  PARTICIPATION LIMITATIONS: meal prep, cleaning, laundry, driving, shopping, community activity, and church  PERSONAL FACTORS: Age, Fitness, Past/current experiences, Time since onset of injury/illness/exacerbation, and 3+ comorbidities: Breast CA s/p B mastectomy, CAD, cataract, depression, GERD, HLD, HTN, osteoporosis, CABG 2007, femoral-popliteal bypass graft 2024  are also affecting patient's functional outcome.   REHAB POTENTIAL: Good  CLINICAL DECISION MAKING: Evolving/moderate complexity  EVALUATION COMPLEXITY: Moderate  PLAN:  PT  FREQUENCY: 2x/week  PT DURATION: 6 weeks  PLANNED INTERVENTIONS: 97164- PT Re-evaluation, 97110-Therapeutic exercises, 97530- Therapeutic activity, 97112- Neuromuscular re-education, 97535- Self Care, [id number]- Manual therapy, [phone]- Gait training, [phone]- Canalith repositioning, Patient/Family education, Balance training, Stair training, Taping, Dry Needling, Joint mobilization, Spinal mobilization, Vestibular training, Cryotherapy, and Moist heat  PLAN FOR NEXT SESSION: endurance training and balance activities that she can perform safely. May want to edu pt on monitoring her vitals at home and staying within safe HR range; work on functional lifting   [name], [place], DPT 03/06/24 5:01 PM  [facility] at [facility] [address] [place], [address] [place], [place] [address] Phone # [phone] Fax # [phone]

## 2024-03-06 ENCOUNTER — Encounter: Payer: Self-pay | Admitting: Physical Therapy

## 2024-03-06 ENCOUNTER — Ambulatory Visit: Admitting: Physical Therapy

## 2024-03-06 DIAGNOSIS — R2681 Unsteadiness on feet: Secondary | ICD-10-CM

## 2024-03-06 DIAGNOSIS — R29898 Other symptoms and signs involving the musculoskeletal system: Secondary | ICD-10-CM | POA: Diagnosis not present

## 2024-03-06 DIAGNOSIS — M6281 Muscle weakness (generalized): Secondary | ICD-10-CM | POA: Diagnosis not present

## 2024-03-06 DIAGNOSIS — R262 Difficulty in walking, not elsewhere classified: Secondary | ICD-10-CM

## 2024-03-07 NOTE — Therapy (Signed)
 OUTPATIENT PHYSICAL THERAPY NEURO TREATMENT   Patient Name: [name] MRN: [id number] DOB:[birth year], 83 y.o., female Today's Date: 03/07/2024   PCP: Colene Dauphin, MD  REFERRING PROVIDER: Colene Dauphin, MD   END OF SESSION:       Past Medical History:  Diagnosis Date   Allergy     SEASONAL   Anxiety    BREAST CANCER 1991   left, 1991; B mastectomy   CAD (coronary artery disease)    s/p CABG x5 in 2007   Cataract    BILATERAL-REMOVED   Depression    DIVERTICULOSIS    GERD    History of DVT (deep vein thrombosis)    HYPERLIPIDEMIA    Hypertension    HYPOTHYROIDISM    Occlusion and stenosis of carotid artery without mention of cerebral infarction    OSTEOPOROSIS    S/P TAVR (transcatheter aortic valve replacement) 06/21/2023   s/p TAVR with a 23mm Edwards S3UR via the TF approach by Dr. Abel Hoe & Dr. Honey Lusty   Severe aortic stenosis    Past Surgical History:  Procedure Laterality Date   APPENDECTOMY     COLONOSCOPY     CORONARY ARTERY BYPASS GRAFT     5        2007   ENDARTERECTOMY FEMORAL Right 06/22/2023   Procedure: RIGHT FEMORAL ENDARTERECTOMY WITH PATCH ANGIOPLASTY;  Surgeon: Margherita Shell, MD;  Location: [facility] OR;  Service: Vascular;  Laterality: Right;   FEMORAL-POPLITEAL BYPASS GRAFT Right 06/22/2023   Procedure: FEMORAL THROMBECTOMY;  Surgeon: Margherita Shell, MD;  Location: [facility] OR;  Service: Vascular;  Laterality: Right;   INTRAOPERATIVE TRANSTHORACIC ECHOCARDIOGRAM N/A 06/21/2023   Procedure: INTRAOPERATIVE TRANSTHORACIC ECHOCARDIOGRAM;  Surgeon: Odie Benne, MD;  Location: [facility];  Service: Open Heart Surgery;  Laterality: N/A;   MASTECTOMY     left with reconstruction and lymph node excision  1992   MASTECTOMY     right with reconstruction   PATCH ANGIOPLASTY Right 06/22/2023   Procedure: PATCH ANGIOPLASTY OF RIGHT FEMORAL ARTERY USING BOVINE PERICARDIAL PATCH;  Surgeon: Margherita Shell, MD;  Location: [facility] OR;  Service:  Vascular;  Laterality: Right;   REVISION RECONSTRUCTED BREAST Left 02/2014   willard (plastics in [place])   right ankle arthroscopy     RIGHT/LEFT HEART CATH AND CORONARY ANGIOGRAPHY N/A 06/09/2023   Procedure: RIGHT/LEFT HEART CATH AND CORONARY ANGIOGRAPHY;  Surgeon: Odie Benne, MD;  Location: [facility];  Service: Cardiovascular;  Laterality: N/A;   TEAR DUCT CANAL     TONSILLECTOMY AND ADENOIDECTOMY     TRANSCATHETER AORTIC VALVE REPLACEMENT, TRANSFEMORAL N/A 06/21/2023   Procedure: Transcatheter Aortic Valve Replacement, Transfemoral;  Surgeon: Odie Benne, MD;  Location: [facility];  Service: Open Heart Surgery;  Laterality: N/A;   UPPER GASTROINTESTINAL ENDOSCOPY     VAGINAL HYSTERECTOMY     ovaries not excised   Patient Active Problem List   Diagnosis Date Noted   Bradykinesia 02/17/2024   Physical deconditioning 01/25/2024   Abnormal ultrasound 07/27/2023   Lung nodule seen on imaging study 07/27/2023   Fatigue 07/27/2023   Constipation 06/26/2023   S/P TAVR (transcatheter aortic valve replacement) 06/21/2023   CAD (coronary artery disease)    History of CVA (cerebrovascular accident) 10/27/2022   Bilateral sensorineural hearing loss 09/22/2022   Chronic diastolic heart failure (HCC) 08/04/2022   Prediabetes 08/04/2022   Acute left-sided low back pain without sciatica 07/30/2020   Cavus deformity of foot 05/01/2020  Poor balance 05/10/2018   Left ventricular diastolic dysfunction 07/27/2017   Essential hypertension 07/27/2017   Fall 03/02/2017   Urinary incontinence 02/16/2017   Chronic venous insufficiency 08/20/2016   Xiphoid pain 03/05/2016   Insomnia 01/16/2016   Anxiety and depression 12/24/2015   Asthma, mild intermittent, well-controlled 11/24/2015   Carotid artery disease (HCC) 06/29/2011   Hypothyroidism 11/11/2010   Hyperlipidemia 11/11/2010   Senile osteoporosis 11/11/2010   RHINOSINUSITIS, RECURRENT 10/29/2008    Personal history of malignant neoplasm of breast 03/27/2008   GERD (gastroesophageal reflux disease) 03/27/2008   DIVERTICULOSIS 03/27/2008   Atherosclerosis of native coronary artery of native heart with angina pectoris (HCC) 08/23/2007   Severe aortic stenosis 08/23/2007   DVT 08/23/2007    ONSET DATE: August 2024  REFERRING DIAG: R26.89 (ICD-10-CM) - Poor balance R53.81 (ICD-10-CM) - Physical deconditioning  THERAPY DIAG:  No diagnosis found.  Rationale for Evaluation and Treatment: Rehabilitation  SUBJECTIVE:                                                                                                                                                                                             SUBJECTIVE STATEMENT: Have been checking my BP at home and it was low, but not too low. Denies dizziness or wooziness. Has questions about 4WW.   Pt accompanied by: self  PERTINENT HISTORY: Breast CA s/p B mastectomy, CAD, cataract, depression, GERD, HLD, HTN, osteoporosis, CABG 2007, femoral-popliteal bypass graft 2024  PAIN:  Are you having pain? No   PRECAUTIONS: Fall and Other: osteoporosis  RED FLAGS: None   WEIGHT BEARING RESTRICTIONS: No  FALLS: Has patient fallen in last 6 months? 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  PATIENT GOALS: work on endurance and improve balance   OBJECTIVE:     TODAY'S TREATMENT: 03/08/24 Activity Comments                        TODAY'S TREATMENT: 03/06/24 Activity Comments  Vitals at start of session 94% spO2, 60bpm  Gait training with 4WW and RW Good stability and posture.  Discussed pros/cons of 4WW and RW  Pt unable to easily lift either one d/t weakness   Sitting ther-ex for UE strengthening: Biceps curls  2x10 Single UE triceps extensions 2x10 Rows 2x10  Yellow TB  Very limited ROM with biceps curls d/t weakness. No pain  Vitals recheck  96% spO2, 55bpm  HOME EXERCISE PROGRAM Last updated: 03/06/24 Access Code: [email or web address] URL: [email or web address] Date: 03/06/2024 Prepared by: [facility] - Outpatient  Rehab - [facility]  Exercises - Sit to Stand with Counter Support  - 1 x daily - 5 x weekly - 2-3 sets - 5 reps - Heel Raises with Counter Support  - 1 x daily - 7 x weekly - 3 sets - 10 reps - Side Stepping with Counter Support  - 1 x daily - 7 x weekly - 3 sets - 1-2 min rounds hold - Feet Together Balance at [employer] Eyes Closed  - 1 x daily - 7 x weekly - 3 sets - 30 sec hold - Semi-Tandem Balance at [employer] Eyes Open  - 1 x daily - 7 x weekly - 3 sets - 30 sec hold - Backward Walking with Counter Support  - 1 x daily - 7 x weekly - 3 sets - 60 sec hold - Semi-Tandem Balance at [employer] Eyes Closed  - 1 x daily - 7 x weekly - 3 sets - 15-30 sec hold - Corner Balance Feet Together: Eyes Closed With Head Turns  - 1 x daily - 7 x weekly - 3 sets - 3 reps - Seated Bilateral Elbow Flexion with Resistance on Swiss Ball  - 1 x daily - 3 x weekly - 2 sets - 10 reps - Seated Elbow Extension with Self-Anchored Resistance  - 1 x daily - 3 x weekly - 2 sets - 10 reps - Standing Shoulder Row with Anchored Resistance  - 1 x daily - 3 x weekly - 2 sets - 10 reps  PATIENT EDUCATION: Education details: edu on recommended frequency of strengthening activities, HEP update ; edu on benefits on ST d/t pt's concerns about low voice volume , POC Person educated: Patient Education method: Explanation, Demonstration, Tactile cues, Verbal cues, and Handouts Education comprehension: verbalized understanding and returned demonstration    Note: Objective measures were completed at Evaluation unless otherwise noted.  Vitals: 98% spO2, 64bpm; 134/64 mmHg  DIAGNOSTIC  FINDINGS: none recent  COGNITION: Overall cognitive status: Within functional limits for tasks assessed   SENSATION: Reports reduced sensation in B feet  POSTURE: rounded shoulders, forward head, and increased thoracic kyphosis  LOWER EXTREMITY MMT:    MMT (in sitting) Right Eval Left Eval  Hip flexion 3 3+  Hip extension    Hip abduction 3+ 3+  Hip adduction 3+ 3+  Hip internal rotation    Hip external rotation    Knee flexion 3+ 4-  Knee extension 3+ 4-  Ankle dorsiflexion 3 3  Ankle plantarflexion 2+ 2+  Ankle inversion    Ankle eversion    (Blank rows = not tested)  GAIT: Gait pattern: Very slow, short B step length, occasionally tripping on cane Assistive device utilized: Single point cane Level of assistance: CGA   FUNCTIONAL TESTS:  5 times sit to stand: 47.01 sec using B UEs  94% spO2 10 meter walk test: 43.89 sec with SPC  0.75 ft/sec  TREATMENT DATE: 01/31/24      GOALS: Goals reviewed with patient? Yes  SHORT TERM GOALS: Target date: 02/21/2024  Patient to be independent with initial HEP. Baseline: HEP initiated; good recall Goal status: MET    LONG TERM GOALS: Target date: 03/13/2024  Patient to be independent with advanced HEP. Baseline: Not yet initiated  Goal status: IN PROGRESS  Patient to demonstrate B LE strength >/=4/5.  Baseline: See above Goal status: IN PROGRESS  Patient to demonstrate gait speed of at least 1.5 ft/sec in order to improve access to community.  Baseline: 0.75 ft/sec Goal status: IN PROGRESS  Patient to demonstrate 5xSTS test in <25 sec in order to decrease risk of falls.  Baseline: 47.01 sec using B UEs Goal status: IN PROGRESS  Patient to score at least 46/56 on Berg in order to decrease risk of falls.  Baseline: 41/56 Goal status: IN PROGRESS  Patient to report plans to  participate in home or community exercise program to maintain fitness.  Baseline: not currently participating  Goal status: IN PROGRESS  ASSESSMENT:  CLINICAL IMPRESSION:   Patient arrived to session without changes. Trialed gait training and educated on 4WW vs. RW. Patient remarking on limited lifting ability with even lighter RW, thus session focused on UE strengthening to allow for functional lifting tasks. HEP was updated with these activities and patient reported understanding. No complaints upon leaving.    OBJECTIVE IMPAIRMENTS: Abnormal gait, decreased activity tolerance, decreased balance, decreased endurance, decreased knowledge of use of DME, decreased strength, decreased safety awareness, and postural dysfunction.   ACTIVITY LIMITATIONS: carrying, lifting, bending, standing, squatting, stairs, transfers, bathing, toileting, dressing, reach over head, hygiene/grooming, and locomotion level  PARTICIPATION LIMITATIONS: meal prep, cleaning, laundry, driving, shopping, community activity, and church  PERSONAL FACTORS: Age, Fitness, Past/current experiences, Time since onset of injury/illness/exacerbation, and 3+ comorbidities: Breast CA s/p B mastectomy, CAD, cataract, depression, GERD, HLD, HTN, osteoporosis, CABG 2007, femoral-popliteal bypass graft 2024  are also affecting patient's functional outcome.   REHAB POTENTIAL: Good  CLINICAL DECISION MAKING: Evolving/moderate complexity  EVALUATION COMPLEXITY: Moderate  PLAN:  PT FREQUENCY: 2x/week  PT DURATION: 6 weeks  PLANNED INTERVENTIONS: 97164- PT Re-evaluation, 97110-Therapeutic exercises, 97530- Therapeutic activity, 97112- Neuromuscular re-education, 97535- Self Care, [address]- Manual therapy, [phone]- Gait training, [phone]- Canalith repositioning, Patient/Family education, Balance training, Stair training, Taping, Dry Needling, Joint mobilization, Spinal mobilization, Vestibular training, Cryotherapy, and Moist heat  PLAN FOR  NEXT SESSION: endurance training and balance activities that she can perform safely. May want to edu pt on monitoring her vitals at home and staying within safe HR range; work on functional lifting   [name], [place], DPT 03/07/24 12:29 PM  [facility] at [facility] [address] [place], [address] [place], [place] [address] Phone # [phone] Fax # [phone]

## 2024-03-08 ENCOUNTER — Encounter: Payer: Self-pay | Admitting: Physical Therapy

## 2024-03-08 ENCOUNTER — Ambulatory Visit: Admitting: Physical Therapy

## 2024-03-08 DIAGNOSIS — R2681 Unsteadiness on feet: Secondary | ICD-10-CM

## 2024-03-08 DIAGNOSIS — R29898 Other symptoms and signs involving the musculoskeletal system: Secondary | ICD-10-CM

## 2024-03-08 DIAGNOSIS — M6281 Muscle weakness (generalized): Secondary | ICD-10-CM

## 2024-03-08 DIAGNOSIS — R262 Difficulty in walking, not elsewhere classified: Secondary | ICD-10-CM | POA: Diagnosis not present

## 2024-03-12 ENCOUNTER — Ambulatory Visit: Admitting: Physical Therapy

## 2024-03-12 ENCOUNTER — Telehealth: Payer: Self-pay | Admitting: Physical Therapy

## 2024-03-12 NOTE — Telephone Encounter (Signed)
 Called and spoke to patient about her missed PT appointment today. She reports that she did not have it on her calendar. Re-confirmed all remaining appointments. Patient reported appreciation for the call.   [name], PT, DPT 03/12/24 3:51 PM  [facility] at [facility] [address] [place], [address] [place], [place] [address] Phone # [phone] Fax # [phone]

## 2024-03-15 NOTE — Progress Notes (Signed)
 Subjective:    Patient ID: [name], female    DOB: 08-31-1941, 83 y.o.   MRN: [id number]     HPI [name] is here for follow up of her chronic medical problems.  4 weeks ago she was here she was having some fatigue BP was on low side-we decrease losartan  to 25 mg daily.  I also referred her to neurology and physical therapy.  She sees neurology 6/25.   Doing PT  - does not feel big difference.   Sleep is not refreshing.   Fatigue - wants to sleep all the time.  Can fall asleep in the middle of the afternoon if she reads.   It takes her longer to do things - takes 1 hour to get her breakfast and eat it.  Does not "come alive until 4pm" she does take the Ambien  at night to help her get to sleep.  She takes half or a third of a pill   BP usually higher than it is here today.   Medications and allergies reviewed with patient and updated if appropriate.  Current Outpatient Medications on File Prior to Visit  Medication Sig Dispense Refill   aspirin  81 MG tablet Take 81 mg by mouth daily.       atorvastatin  (LIPITOR) 20 MG tablet TAKE 1 TABLET BY MOUTH EVERY DAY 90 tablet 1   Calcium  Carbonate (CALTRATE 600 PO) Take 1 tablet by mouth 2 (two) times daily.       citalopram  (CELEXA ) 20 MG tablet TAKE 1 TABLET BY MOUTH EVERY DAY 90 tablet 2   co-enzyme Q-10 30 MG capsule Take 30 mg by mouth daily.     famotidine  (PEPCID ) 40 MG tablet TAKE 1 TABLET BY MOUTH EVERY DAY 90 tablet 1   fluticasone  (FLONASE ) 50 MCG/ACT nasal spray Place 1 spray into both nostrils daily as needed for allergies or rhinitis. 9.9 mL 5   furosemide  (LASIX ) 20 MG tablet TAKE 1 TABLET BY MOUTH EVERY DAY 90 tablet 3   levothyroxine  (SYNTHROID ) 75 MCG tablet TAKE 1 TABLET BY MOUTH EVERY MORNING BEFORE BREAKFAST 90 tablet 1   losartan  (COZAAR ) 25 MG tablet Take 1 tablet (25 mg total) by mouth daily. 90 tablet 3   meloxicam  (MOBIC ) 7.5 MG tablet TAKE 1/2 TO 1 TABLET EVERY DAY 90 tablet 0   metoprolol  tartrate  (LOPRESSOR ) 25 MG tablet TAKE 1 TABLET BY MOUTH TWICE A DAY 180 tablet 1   Multiple Vitamins-Minerals (MULTIVITAMIN) LIQD Take 1 tablet by mouth daily.     Multiple Vitamins-Minerals (PRESERVISION AREDS 2) CAPS Take 1 capsule by mouth 2 (two) times daily.     senna-docusate (SENOKOT-S) 8.6-50 MG per tablet Take 1 tablet by mouth daily.       VITAMIN D , CHOLECALCIFEROL, PO Take 1 tablet by mouth daily.     zolpidem  (AMBIEN ) 5 MG tablet TAKE 1 TABLET (5 MG TOTAL) BY MOUTH EVERY DAY AT BEDTIME AS NEEDED FOR SLEEP 30 tablet 5   No current facility-administered medications on file prior to visit.     Review of Systems  Constitutional:  Positive for fatigue.  HENT:  Positive for voice change (weak voice). Negative for trouble swallowing.   Respiratory:  Positive for shortness of breath (gotten worse). Negative for cough and wheezing.   Cardiovascular:  Positive for leg swelling (chronic - mild). Negative for chest pain and palpitations.  Genitourinary:        Urinary incontinence  Neurological:  Positive for  weakness (generalized). Negative for light-headedness and headaches.  Psychiatric/Behavioral:  Negative for sleep disturbance.        Objective:   Vitals:   03/16/24 1447  BP: 110/64  Pulse: 60  Temp: 98 F (36.7 C)  SpO2: 94%   BP Readings from Last 3 Encounters:  03/16/24 110/64  02/17/24 106/68  01/25/24 116/66   Wt Readings from Last 3 Encounters:  03/16/24 119 lb (54 kg)  02/17/24 120 lb (54.4 kg)  01/25/24 121 lb (54.9 kg)   Body mass index is 21.08 kg/m.    Physical Exam Constitutional:      General: She is not in acute distress.    Appearance: Normal appearance. She is not ill-appearing.     Comments: Voice is weak-soft  Cardiovascular:     Rate and Rhythm: Normal rate and regular rhythm.     Heart sounds: Murmur (3/6 systolic) heard.  Pulmonary:     Effort: Pulmonary effort is normal.     Breath sounds: Normal breath sounds.  Musculoskeletal:      Right lower leg: Edema (Mild) present.     Left lower leg: Edema (Mild) present.  Skin:    General: Skin is warm and dry.  Neurological:     Mental Status: She is alert.     Gait: Gait abnormal (Walks with a cane-slow -no shuffling).        Lab Results  Component Value Date   WBC 6.0 01/25/2024   HGB 12.2 01/25/2024   HCT 37.1 01/25/2024   PLT 246.0 01/25/2024   GLUCOSE 92 01/25/2024   CHOL 122 01/25/2024   TRIG 52.0 01/25/2024   HDL 54.70 01/25/2024   LDLCALC 57 01/25/2024   ALT 27 01/25/2024   AST 32 01/25/2024   NA 135 01/25/2024   K 4.3 01/25/2024   CL 100 01/25/2024   CREATININE 0.66 01/25/2024   BUN 16 01/25/2024   CO2 31 01/25/2024   TSH 0.80 01/25/2024   INR 1.1 06/17/2023   HGBA1C 6.0 01/25/2024     Assessment & Plan:    See Problem List for Assessment and Plan of chronic medical problems.

## 2024-03-15 NOTE — Patient Instructions (Addendum)
   Have a chest xray today    Medications changes include :   fosamax 70 mg once a week     Return in about 3 months (around 06/16/2024) for follow up.

## 2024-03-16 ENCOUNTER — Ambulatory Visit (INDEPENDENT_AMBULATORY_CARE_PROVIDER_SITE_OTHER)

## 2024-03-16 ENCOUNTER — Ambulatory Visit (INDEPENDENT_AMBULATORY_CARE_PROVIDER_SITE_OTHER): Admitting: Internal Medicine

## 2024-03-16 ENCOUNTER — Encounter: Payer: Self-pay | Admitting: Internal Medicine

## 2024-03-16 VITALS — BP 110/64 | HR 60 | Temp 98.0°F | Ht 63.0 in | Wt 119.0 lb

## 2024-03-16 DIAGNOSIS — M810 Age-related osteoporosis without current pathological fracture: Secondary | ICD-10-CM | POA: Diagnosis not present

## 2024-03-16 DIAGNOSIS — R0609 Other forms of dyspnea: Secondary | ICD-10-CM | POA: Diagnosis not present

## 2024-03-16 DIAGNOSIS — I700 Atherosclerosis of aorta: Secondary | ICD-10-CM | POA: Diagnosis not present

## 2024-03-16 DIAGNOSIS — I10 Essential (primary) hypertension: Secondary | ICD-10-CM | POA: Diagnosis not present

## 2024-03-16 DIAGNOSIS — R498 Other voice and resonance disorders: Secondary | ICD-10-CM | POA: Insufficient documentation

## 2024-03-16 DIAGNOSIS — G4709 Other insomnia: Secondary | ICD-10-CM

## 2024-03-16 DIAGNOSIS — R0602 Shortness of breath: Secondary | ICD-10-CM | POA: Diagnosis not present

## 2024-03-16 DIAGNOSIS — I517 Cardiomegaly: Secondary | ICD-10-CM | POA: Diagnosis not present

## 2024-03-16 MED ORDER — ALENDRONATE SODIUM 70 MG PO TABS
70.0000 mg | ORAL_TABLET | ORAL | 3 refills | Status: AC
Start: 2024-03-16 — End: ?

## 2024-03-16 NOTE — Assessment & Plan Note (Addendum)
 New Reviewed dexa Discussed medications to treat and possible side effects Start fosamax 70 mg weekly

## 2024-03-16 NOTE — Assessment & Plan Note (Signed)
 Chronic Has been on Ambien  for years Currently taking half-one third of a pill at night to help her get to sleep She does state excessive fatigue and sleeping a lot Advised that she try to go to a quarter of a pill and if she does not needed to stop it

## 2024-03-16 NOTE — Assessment & Plan Note (Signed)
 Has been going on for several months ?  Neurologic versus issue with the vocal cords Has an appointment to see Dr. Ty Gales in June Discussed possible referral to ENT-she would like to hold off for now

## 2024-03-16 NOTE — Assessment & Plan Note (Signed)
 Chronic Blood pressure medicine decreased at her last visit Blood pressure okay here today and typically little higher at home No change in medications today Continue losartan  to 25 mg daily Continue metoprolol  25 mg twice daily Follow-up in 3 months

## 2024-03-17 ENCOUNTER — Other Ambulatory Visit: Payer: Self-pay | Admitting: Internal Medicine

## 2024-03-19 ENCOUNTER — Encounter: Payer: Self-pay | Admitting: Surgery

## 2024-03-19 ENCOUNTER — Ambulatory Visit (HOSPITAL_COMMUNITY)
Admission: RE | Admit: 2024-03-19 | Discharge: 2024-03-19 | Disposition: A | Discharge status: Home / Self Care | Payer: Medicare Other | Source: Ambulatory Visit | Attending: Surgery | Admitting: Surgery

## 2024-03-19 ENCOUNTER — Ambulatory Visit (INDEPENDENT_AMBULATORY_CARE_PROVIDER_SITE_OTHER): Payer: Medicare Other | Admitting: Surgery

## 2024-03-19 VITALS — BP 184/69 | HR 59 | Temp 98.0°F | Resp 20 | Ht 63.0 in | Wt 119.0 lb

## 2024-03-19 DIAGNOSIS — S75002D Unspecified injury of femoral artery, left leg, subsequent encounter: Secondary | ICD-10-CM | POA: Insufficient documentation

## 2024-03-19 DIAGNOSIS — S75001D Unspecified injury of femoral artery, right leg, subsequent encounter: Secondary | ICD-10-CM

## 2024-03-19 LAB — VAS US ABI WITH/WO TBI
Left ABI: 1.2
Right ABI: 1.09

## 2024-03-19 NOTE — Progress Notes (Signed)
 [facility] [place]  Patient name: [name] MRN: [id number] DOB: 01-14-1941 Sex: female   REASON FOR VISIT:    Follow up  HISOTRY OF PRESENT ILLNESS:    [name] is a 83 y.o. female who underwent TAVR and 2024.  The following morning she was complaining of right leg numbness.  Duplex showed a femoral artery occlusion.  She went to the operating room on 06/22/2023 for right femoral endarterectomy with bovine patch angioplasty and thrombectomy.  She was seen in the office in November and was having issues with leg swelling and pain.  She described her leg as feeling heavy like a dead weight.  She is back today for follow-up and has multiple complaints.  She is having trouble with her breathing.  She does not feel like she has any energy into the afternoon.  She has bilateral leg swelling.  She is concerned about her right great toenail.  Patient has a history of CABG in 2007. She is on statin for hyperlipidemia. She is medically managed for hypertension. She is a non-smoker. She lives by herself.  PAST MEDICAL HISTORY:   Past Medical History:  Diagnosis Date   Allergy     SEASONAL   Anxiety    BREAST CANCER 1991   left, 1991; B mastectomy   CAD (coronary artery disease)    s/p CABG x5 in 2007   Cataract    BILATERAL-REMOVED   Depression    DIVERTICULOSIS    GERD    History of DVT (deep vein thrombosis)    HYPERLIPIDEMIA    Hypertension    HYPOTHYROIDISM    Occlusion and stenosis of carotid artery without mention of cerebral infarction    OSTEOPOROSIS    S/P TAVR (transcatheter aortic valve replacement) 06/21/2023   s/p TAVR with a 23mm Edwards S3UR via the TF approach by Dr. Abel Hoe & Dr. Honey Lusty   Severe aortic stenosis      FAMILY HISTORY:   Family History  Problem Relation Age of Onset   Colon cancer Sister    Colon cancer Sister    Breast cancer Mother    Lung disease Mother    Emphysema Father     Esophageal cancer Neg Hx    Rectal cancer Neg Hx    Stomach cancer Neg Hx    Thyroid  disease Neg Hx     SOCIAL HISTORY:   Social History   Tobacco Use   Smoking status: Never   Smokeless tobacco: Never  Substance Use Topics   Alcohol use: No    Alcohol/week: 0.0 standard drinks of alcohol     ALLERGIES:   Allergies  Allergen Reactions   Clarithromycin Other (See Comments)    PATIENT UNSURE OF REACTION   Clindamycin Other (See Comments)   Codeine     REACTION: nausea   Levofloxacin     REACTION: nausea   Morphine Other (See Comments)    Nausea  Nausea    Naproxen     REACTION: nausea   Omeprazole  Other (See Comments)    Made her jittery, made her feel weird, ?rash   Penicillins Other (See Comments) and Rash    REACTION: RASH REACTION: RASH    Pneumococcal Vaccines Rash    Small rash on arm at injection site.      CURRENT MEDICATIONS:   Current Outpatient Medications  Medication Sig Dispense Refill   alendronate (FOSAMAX) 70 MG tablet Take 1 tablet (70 mg total) by mouth every 7 (  seven) days. Take with a full glass of water  on an empty stomach. 12 tablet 3   aspirin  81 MG tablet Take 81 mg by mouth daily.       atorvastatin  (LIPITOR) 20 MG tablet TAKE 1 TABLET BY MOUTH EVERY DAY 90 tablet 1   Calcium  Carbonate (CALTRATE 600 PO) Take 1 tablet by mouth 2 (two) times daily.       citalopram  (CELEXA ) 20 MG tablet TAKE 1 TABLET BY MOUTH EVERY DAY 90 tablet 2   co-enzyme Q-10 30 MG capsule Take 30 mg by mouth daily.     famotidine  (PEPCID ) 40 MG tablet TAKE 1 TABLET BY MOUTH EVERY DAY 90 tablet 1   fluticasone  (FLONASE ) 50 MCG/ACT nasal spray Place 1 spray into both nostrils daily as needed for allergies or rhinitis. 9.9 mL 5   furosemide  (LASIX ) 20 MG tablet TAKE 1 TABLET BY MOUTH EVERY DAY 90 tablet 3   levothyroxine  (SYNTHROID ) 75 MCG tablet TAKE 1 TABLET BY MOUTH EVERY MORNING BEFORE BREAKFAST 90 tablet 1   losartan  (COZAAR ) 25 MG tablet Take 1 tablet (25  mg total) by mouth daily. 90 tablet 3   meloxicam  (MOBIC ) 7.5 MG tablet TAKE 1/2 TO 1 TABLET EVERY DAY 90 tablet 0   metoprolol  tartrate (LOPRESSOR ) 25 MG tablet TAKE 1 TABLET BY MOUTH TWICE A DAY 180 tablet 1   Multiple Vitamins-Minerals (MULTIVITAMIN) LIQD Take 1 tablet by mouth daily.     Multiple Vitamins-Minerals (PRESERVISION AREDS 2) CAPS Take 1 capsule by mouth 2 (two) times daily.     senna-docusate (SENOKOT-S) 8.6-50 MG per tablet Take 1 tablet by mouth daily.       VITAMIN D , CHOLECALCIFEROL, PO Take 1 tablet by mouth daily.     zolpidem  (AMBIEN ) 5 MG tablet TAKE 1 TABLET (5 MG TOTAL) BY MOUTH EVERY DAY AT BEDTIME AS NEEDED FOR SLEEP 30 tablet 5   No current facility-administered medications for this visit.    REVIEW OF SYSTEMS:   [X]  denotes positive finding, [ ]  denotes negative finding Cardiac  Comments:  Chest pain or chest pressure:    Shortness of breath upon exertion:    Short of breath when lying flat:    Irregular heart rhythm:        Vascular    Pain in calf, thigh, or hip brought on by ambulation:    Pain in feet at night that wakes you up from your sleep:     Blood clot in your veins:    Leg swelling:  x       Pulmonary    Oxygen at home:    Productive cough:     Wheezing:         Neurologic    Sudden weakness in arms or legs:     Sudden numbness in arms or legs:     Sudden onset of difficulty speaking or slurred speech:    Temporary loss of vision in one eye:     Problems with dizziness:         Gastrointestinal    Blood in stool:     Vomited blood:         Genitourinary    Burning when urinating:     Blood in urine:        Psychiatric    Major depression:         Hematologic    Bleeding problems:    Problems with blood clotting too easily:  Skin    Rashes or ulcers:        Constitutional    Fever or chills:      PHYSICAL EXAM:   There were no vitals filed for this visit.  GENERAL: The patient is a well-nourished female,  in no acute distress. The vital signs are documented above. CARDIAC: There is a regular rate and rhythm.  VASCULAR: Palpable posterior tibial pulses bilaterally PULMONARY: Non-labored respirations ABDOMEN: Soft and non-tender   MUSCULOSKELETAL: There are no major deformities or cyanosis. NEUROLOGIC: No focal weakness or paresthesias are detected. SKIN: There are no ulcers or rashes noted. PSYCHIATRIC: The patient has a normal affect.  STUDIES:   I have reviewed the following: ABI/TBIToday's ABIToday's TBIPrevious ABIPrevious TBI  +-------+-----------+-----------+------------+------------+  Right 1.09       0.79                                 +-------+-----------+-----------+------------+------------+  Left  1.2        0.78                                 +-------+-----------+-----------+------------+------------+  Right: Patent femoral-popliteal system with no stenosis noted.    MEDICAL ISSUES:   Status post right femoral endarterectomy with patch angioplasty for closure device issues following TAVR.  The patient has recovered from this and has normal ABIs bilaterally.  She does have leg swelling but it is bilateral so I do not think at this time I can completely attribute her edema to her femoral incision.  She has multiple complaints today.  It seems like these potentially could be related to cardiac issues.  Fortunately she is scheduled to see Dr. Arlester Ladd next week the if these are indeed related to her cardiac issues.  I have her scheduled to follow-up with me in 1 year with repeat ABIs    Marti Slates, MD, FACS [facility] [phone] Pager [phone]

## 2024-03-20 ENCOUNTER — Ambulatory Visit: Attending: Internal Medicine

## 2024-03-20 DIAGNOSIS — M6281 Muscle weakness (generalized): Secondary | ICD-10-CM | POA: Diagnosis not present

## 2024-03-20 DIAGNOSIS — R2681 Unsteadiness on feet: Secondary | ICD-10-CM | POA: Insufficient documentation

## 2024-03-20 DIAGNOSIS — R262 Difficulty in walking, not elsewhere classified: Secondary | ICD-10-CM | POA: Insufficient documentation

## 2024-03-20 DIAGNOSIS — R29898 Other symptoms and signs involving the musculoskeletal system: Secondary | ICD-10-CM | POA: Diagnosis not present

## 2024-03-20 NOTE — Therapy (Signed)
 OUTPATIENT PHYSICAL THERAPY NEURO TREATMENT NOTE   Patient Name: [name] MRN: [id number] DOB:1941-10-18, 83 y.o., female Today's Date: 03/20/2024   PCP: Colene Dauphin, MD  REFERRING PROVIDER: Colene Dauphin, MD      END OF SESSION:  PT End of Session - 03/20/24 1440     Visit Number 10    Number of Visits 17    Date for PT Re-Evaluation 04/05/24    Authorization Type Medicare/BCBS    Authorization - Number of Visits 60    Progress Note Due on Visit 19    PT Start Time 1445    PT Stop Time 1530    PT Time Calculation (min) 45 min    Equipment Utilized During Treatment Gait belt    Activity Tolerance Patient tolerated treatment well    Behavior During Therapy WFL for tasks assessed/performed                 Past Medical History:  Diagnosis Date   Allergy     SEASONAL   Anxiety    BREAST CANCER 1991   left, 1991; B mastectomy   CAD (coronary artery disease)    s/p CABG x5 in 2007   Cataract    BILATERAL-REMOVED   Depression    DIVERTICULOSIS    GERD    History of DVT (deep vein thrombosis)    HYPERLIPIDEMIA    Hypertension    HYPOTHYROIDISM    Occlusion and stenosis of carotid artery without mention of cerebral infarction    OSTEOPOROSIS    S/P TAVR (transcatheter aortic valve replacement) 06/21/2023   s/p TAVR with a 23mm Edwards S3UR via the TF approach by Dr. Abel Hoe & Dr. Honey Lusty   Severe aortic stenosis    Past Surgical History:  Procedure Laterality Date   APPENDECTOMY     COLONOSCOPY     CORONARY ARTERY BYPASS GRAFT     5        2007   ENDARTERECTOMY FEMORAL Right 06/22/2023   Procedure: RIGHT FEMORAL ENDARTERECTOMY WITH PATCH ANGIOPLASTY;  Surgeon: Margherita Shell, MD;  Location: [facility] OR;  Service: Vascular;  Laterality: Right;   FEMORAL-POPLITEAL BYPASS GRAFT Right 06/22/2023   Procedure: FEMORAL THROMBECTOMY;  Surgeon: Margherita Shell, MD;  Location: [facility] OR;  Service: Vascular;  Laterality: Right;   INTRAOPERATIVE TRANSTHORACIC  ECHOCARDIOGRAM N/A 06/21/2023   Procedure: INTRAOPERATIVE TRANSTHORACIC ECHOCARDIOGRAM;  Surgeon: Odie Benne, MD;  Location: [facility] INVASIVE CV LAB;  Service: Open Heart Surgery;  Laterality: N/A;   MASTECTOMY     left with reconstruction and lymph node excision  1992   MASTECTOMY     right with reconstruction   PATCH ANGIOPLASTY Right 06/22/2023   Procedure: PATCH ANGIOPLASTY OF RIGHT FEMORAL ARTERY USING BOVINE PERICARDIAL PATCH;  Surgeon: Margherita Shell, MD;  Location: [facility] OR;  Service: Vascular;  Laterality: Right;   REVISION RECONSTRUCTED BREAST Left 02/2014   willard (plastics in [place])   right ankle arthroscopy     RIGHT/LEFT HEART CATH AND CORONARY ANGIOGRAPHY N/A 06/09/2023   Procedure: RIGHT/LEFT HEART CATH AND CORONARY ANGIOGRAPHY;  Surgeon: Odie Benne, MD;  Location: [facility] IN[facility];  Service: Cardiovascular;  Laterality: N/A;   TEAR DUCT CANAL     TONSILLECTOMY AND ADENOIDECTOMY     TRANSCATHETER AORTIC VALVE REPLACEMENT, TRANSFEMORAL N/A 06/21/2023   Procedure: Transcatheter Aortic Valve Replacement, Transfemoral;  Surgeon: Odie Benne, MD;  Location: [facility] IN[facility];  Service: Open Heart Surgery;  Laterality: N/A;  UPPER GASTROINTESTINAL ENDOSCOPY     VAGINAL HYSTERECTOMY     ovaries not excised   Patient Active Problem List   Diagnosis Date Noted   Weakness of voice 03/16/2024   Bradykinesia 02/17/2024   Physical deconditioning 01/25/2024   Abnormal ultrasound 07/27/2023   Lung nodule seen on imaging study 07/27/2023   Fatigue 07/27/2023   Constipation 06/26/2023   S/P TAVR (transcatheter aortic valve replacement) 06/21/2023   CAD (coronary artery disease)    History of CVA (cerebrovascular accident) 10/27/2022   Bilateral sensorineural hearing loss 09/22/2022   Chronic diastolic heart failure (HCC) 08/04/2022   Prediabetes 08/04/2022   Acute left-sided low back pain without sciatica 07/30/2020   Cavus deformity of foot 05/01/2020    Poor balance 05/10/2018   Left ventricular diastolic dysfunction 07/27/2017   Essential hypertension 07/27/2017   Fall 03/02/2017   Urinary incontinence 02/16/2017   Chronic venous insufficiency 08/20/2016   Xiphoid pain 03/05/2016   Insomnia 01/16/2016   Anxiety and depression 12/24/2015   Asthma, mild intermittent, well-controlled 11/24/2015   Carotid artery disease (HCC) 06/29/2011   Hypothyroidism 11/11/2010   Hyperlipidemia 11/11/2010   Senile osteoporosis 11/11/2010   RHINOSINUSITIS, RECURRENT 10/29/2008   Personal history of malignant neoplasm of breast 03/27/2008   GERD (gastroesophageal reflux disease) 03/27/2008   DIVERTICULOSIS 03/27/2008   Atherosclerosis of native coronary artery of native heart with angina pectoris (HCC) 08/23/2007   Severe aortic stenosis 08/23/2007   DVT 08/23/2007    ONSET DATE: August 2024  REFERRING DIAG: R26.89 (ICD-10-CM) - Poor balance R53.81 (ICD-10-CM) - Physical deconditioning  THERAPY DIAG:  Muscle weakness (generalized)  Difficulty in walking, not elsewhere classified  Unsteadiness on feet  Other symptoms and signs involving the musculoskeletal system  Rationale for Evaluation and Treatment: Rehabilitation  SUBJECTIVE:                                                                                                                                                                                             SUBJECTIVE STATEMENT: Went to the vascular MD yesterday and reports blood flow is good in the legs.   Pt accompanied by: self  PERTINENT HISTORY: Breast CA s/p B mastectomy, CAD, cataract, depression, GERD, HLD, HTN, osteoporosis, CABG 2007, femoral-popliteal bypass graft 2024  PAIN:  Are you having pain? No   PRECAUTIONS: Fall and Other: osteoporosis  RED FLAGS: None   WEIGHT BEARING RESTRICTIONS: No  FALLS: Has patient fallen in last 6 months? No  LIVING ENVIRONMENT: Lives with: lives alone Lives in: Other  townhome Stairs: 7 steps to enter with railing Has following equipment at home: Single point cane,  Walker - 2 wheeled, and Grab bars  PLOF: Independent; monthly cleaning service  PATIENT GOALS: work on endurance and improve balance   OBJECTIVE:   TODAY'S TREATMENT: 03/20/24 Activity Comments  Vitals: 138/68 mmHg, 55 bpm   HEP review Good recall. Cues and demo for newly added upper body strength  Static multisensory balance -with and without postural perturbations                TODAY'S TREATMENT: 03/08/24 Activity Comments  5xSTS  19.29 sec with B UE support, not letting go of armrests   64M walk 23.41 sec with SPC (1.4 ft/sec)  Berg  44/56   Vitals during session  97-98% 55bpm              HOME EXERCISE PROGRAM Last updated: 03/06/24 Access Code: [email or web address] URL: [email or web address] Date: 03/06/2024 Prepared by: [facility] - Outpatient  Rehab - [facility]  Exercises - Sit to Stand with Counter Support  - 1 x daily - 5 x weekly - 2-3 sets - 5 reps - Heel Raises with Counter Support  - 1 x daily - 7 x weekly - 3 sets - 10 reps - Side Stepping with Counter Support  - 1 x daily - 7 x weekly - 3 sets - 1-2 min rounds hold - Feet Together Balance at [employer] Eyes Closed  - 1 x daily - 7 x weekly - 3 sets - 30 sec hold - Semi-Tandem Balance at [employer] Eyes Open  - 1 x daily - 7 x weekly - 3 sets - 30 sec hold - Backward Walking with Counter Support  - 1 x daily - 7 x weekly - 3 sets - 60 sec hold - Semi-Tandem Balance at [employer] Eyes Closed  - 1 x daily - 7 x weekly - 3 sets - 15-30 sec hold - Corner Balance Feet Together: Eyes Closed With Head Turns  - 1 x daily - 7 x weekly - 3 sets - 3 reps - Seated Bilateral Elbow Flexion with Resistance on Swiss Ball  - 1 x daily - 3 x weekly - 2 sets - 10 reps - Seated Elbow Extension with Self-Anchored Resistance  - 1 x daily - 3 x weekly - 2 sets - 10 reps - Standing Shoulder Row with Anchored Resistance   - 1 x daily - 3 x weekly - 2 sets - 10 reps   PATIENT EDUCATION: Education details: discussed pt's HEP compliance- she is not able to verbalize how often she is performing her HEP as she reports "I don't keep records." Addressed pt's questions about her HEP and reprinted handouts in larger font; exam findings,  POC Person educated: Patient Education method: Explanation, Demonstration, Tactile cues, Verbal cues, and Handouts Education comprehension: verbalized understanding and returned demonstration     Note: Objective measures were completed at Evaluation unless otherwise noted.  Vitals: 98% spO2, 64bpm; 134/64 mmHg  DIAGNOSTIC FINDINGS: none recent  COGNITION: Overall cognitive status: Within functional limits for tasks assessed   SENSATION: Reports reduced sensation in B feet  POSTURE: rounded shoulders, forward head, and increased thoracic kyphosis  LOWER EXTREMITY MMT:    MMT (in sitting) Right Eval Left Eval  Hip flexion 3 3+  Hip extension    Hip abduction 3+ 3+  Hip adduction 3+ 3+  Hip internal rotation    Hip external rotation    Knee flexion 3+ 4-  Knee extension 3+ 4-  Ankle dorsiflexion 3 3  Ankle plantarflexion 2+  2+  Ankle inversion    Ankle eversion    (Blank rows = not tested)  GAIT: Gait pattern: Very slow, short B step length, occasionally tripping on cane Assistive device utilized: Single point cane Level of assistance: CGA   FUNCTIONAL TESTS:  5 times sit to stand: 47.01 sec using B UEs  94% spO2 10 meter walk test: 43.89 sec with SPC  0.75 ft/sec                                                                                                                               TREATMENT DATE: 01/31/24      GOALS: Goals reviewed with patient? Yes  SHORT TERM GOALS: Target date: 02/21/2024  Patient to be independent with initial HEP. Baseline: HEP initiated; good recall Goal status: MET    LONG TERM GOALS: Target date:  04/05/2024  Patient to be independent with advanced HEP. Baseline: Not yet initiated ; met for current 03/08/24 Goal status: IN PROGRESS 03/08/24  Patient to demonstrate B LE strength >/=4/5.  Baseline: See above; NT 03/08/24 Goal status: IN PROGRESS 03/08/24  Patient to demonstrate gait speed of at least 1.5 ft/sec in order to improve access to community.  Baseline: 0.75 ft/sec; 1.4 ft/sec 03/08/24 Goal status: IN PROGRESS 03/08/24  Patient to demonstrate 5xSTS test in <25 sec in order to decrease risk of falls.  Baseline: 47.01 sec using B UEs; 19.29 sec with B UEs 03/08/24 Goal status: MET 03/08/24  Patient to score at least 46/56 on Berg in order to decrease risk of falls.  Baseline: 41/56; 44/56 03/08/24 Goal status: IN PROGRESS 03/08/24  Patient to report plans to participate in home or community exercise program to maintain fitness.  Baseline: not currently participating; unchanged 03/08/24 Goal status: IN PROGRESS 03/08/24  Patient to report 50% improvement in ability to open car door.  Baseline: reports difficulty d/t weakness 03/08/24 Goal status: INITIAL 03/08/24    ASSESSMENT:  CLINICAL IMPRESSION: Review of HEP for reinforcement for home performance with good recall up to recent additions for upper body strength. Multisensory balance with addition of mild postural perturbations to facilitate righting and anticipatory reactions to reduce risk for falls. Tolerated session well. Continued sessions to advance strength and balance activities to reduce risk for falls.    OBJECTIVE IMPAIRMENTS: Abnormal gait, decreased activity tolerance, decreased balance, decreased endurance, decreased knowledge of use of DME, decreased strength, decreased safety awareness, and postural dysfunction.   ACTIVITY LIMITATIONS: carrying, lifting, bending, standing, squatting, stairs, transfers, bathing, toileting, dressing, reach over head, hygiene/grooming, and locomotion level  PARTICIPATION  LIMITATIONS: meal prep, cleaning, laundry, driving, shopping, community activity, and church  PERSONAL FACTORS: Age, Fitness, Past/current experiences, Time since onset of injury/illness/exacerbation, and 3+ comorbidities: Breast CA s/p B mastectomy, CAD, cataract, depression, GERD, HLD, HTN, osteoporosis, CABG 2007, femoral-popliteal bypass graft 2024  are also affecting patient's functional outcome.   REHAB POTENTIAL: Good  CLINICAL DECISION MAKING: Evolving/moderate complexity  EVALUATION COMPLEXITY: Moderate  PLAN:  PT FREQUENCY: 2x/week  PT DURATION: 4 weeks  PLANNED INTERVENTIONS: 97164- PT Re-evaluation, 97110-Therapeutic exercises, 97530- Therapeutic activity, 97112- Neuromuscular re-education, 97535- Self Care, [id number]- Manual therapy, [phone]- Gait training, [phone]- Canalith repositioning, Patient/Family education, Balance training, Stair training, Taping, Dry Needling, Joint mobilization, Spinal mobilization, Vestibular training, Cryotherapy, and Moist heat  PLAN FOR NEXT SESSION: endurance training and balance activities that she can perform safely. Dynamic balance, work on functional lifting   4:58 PM, 03/20/24 M. Kelly Katelinn Justice, PT, DPT Physical Therapist- [place] Office Number: [phone]

## 2024-03-22 ENCOUNTER — Ambulatory Visit

## 2024-03-22 DIAGNOSIS — M6281 Muscle weakness (generalized): Secondary | ICD-10-CM

## 2024-03-22 DIAGNOSIS — R262 Difficulty in walking, not elsewhere classified: Secondary | ICD-10-CM | POA: Diagnosis not present

## 2024-03-22 DIAGNOSIS — R2681 Unsteadiness on feet: Secondary | ICD-10-CM | POA: Diagnosis not present

## 2024-03-22 DIAGNOSIS — R29898 Other symptoms and signs involving the musculoskeletal system: Secondary | ICD-10-CM | POA: Diagnosis not present

## 2024-03-22 NOTE — Therapy (Signed)
 OUTPATIENT PHYSICAL THERAPY NEURO TREATMENT NOTE   Patient Name: [name] MRN: [id number] DOB:May 24, 1941, 83 y.o., female Today's Date: 03/22/2024   PCP: Colene Dauphin, MD  REFERRING PROVIDER: Colene Dauphin, MD      END OF SESSION:  PT End of Session - 03/22/24 1613     Visit Number 11    Number of Visits 17    Date for PT Re-Evaluation 04/05/24    Authorization Type Medicare/BCBS    Authorization - Number of Visits 60    Progress Note Due on Visit 19    PT Start Time 1615    PT Stop Time 1700    PT Time Calculation (min) 45 min    Equipment Utilized During Treatment Gait belt    Activity Tolerance Patient tolerated treatment well    Behavior During Therapy WFL for tasks assessed/performed                 Past Medical History:  Diagnosis Date   Allergy     SEASONAL   Anxiety    BREAST CANCER 1991   left, 1991; B mastectomy   CAD (coronary artery disease)    s/p CABG x5 in 2007   Cataract    BILATERAL-REMOVED   Depression    DIVERTICULOSIS    GERD    History of DVT (deep vein thrombosis)    HYPERLIPIDEMIA    Hypertension    HYPOTHYROIDISM    Occlusion and stenosis of carotid artery without mention of cerebral infarction    OSTEOPOROSIS    S/P TAVR (transcatheter aortic valve replacement) 06/21/2023   s/p TAVR with a 23mm Edwards S3UR via the TF approach by Dr. Abel Hoe & Dr. Honey Lusty   Severe aortic stenosis    Past Surgical History:  Procedure Laterality Date   APPENDECTOMY     COLONOSCOPY     CORONARY ARTERY BYPASS GRAFT     5        2007   ENDARTERECTOMY FEMORAL Right 06/22/2023   Procedure: RIGHT FEMORAL ENDARTERECTOMY WITH PATCH ANGIOPLASTY;  Surgeon: Margherita Shell, MD;  Location: [facility] OR;  Service: Vascular;  Laterality: Right;   FEMORAL-POPLITEAL BYPASS GRAFT Right 06/22/2023   Procedure: FEMORAL THROMBECTOMY;  Surgeon: Margherita Shell, MD;  Location: [facility] OR;  Service: Vascular;  Laterality: Right;   INTRAOPERATIVE TRANSTHORACIC  ECHOCARDIOGRAM N/A 06/21/2023   Procedure: INTRAOPERATIVE TRANSTHORACIC ECHOCARDIOGRAM;  Surgeon: Odie Benne, MD;  Location: [facility];  Service: Open Heart Surgery;  Laterality: N/A;   MASTECTOMY     left with reconstruction and lymph node excision  1992   MASTECTOMY     right with reconstruction   PATCH ANGIOPLASTY Right 06/22/2023   Procedure: PATCH ANGIOPLASTY OF RIGHT FEMORAL ARTERY USING BOVINE PERICARDIAL PATCH;  Surgeon: Margherita Shell, MD;  Location: [facility] OR;  Service: Vascular;  Laterality: Right;   REVISION RECONSTRUCTED BREAST Left 02/2014   willard (plastics in [place])   right ankle arthroscopy     RIGHT/LEFT HEART CATH AND CORONARY ANGIOGRAPHY N/A 06/09/2023   Procedure: RIGHT/LEFT HEART CATH AND CORONARY ANGIOGRAPHY;  Surgeon: Odie Benne, MD;  Location: [facility];  Service: Cardiovascular;  Laterality: N/A;   TEAR DUCT CANAL     TONSILLECTOMY AND ADENOIDECTOMY     TRANSCATHETER AORTIC VALVE REPLACEMENT, TRANSFEMORAL N/A 06/21/2023   Procedure: Transcatheter Aortic Valve Replacement, Transfemoral;  Surgeon: Odie Benne, MD;  Location: [facility];  Service: Open Heart Surgery;  Laterality: N/A;  UPPER GASTROINTESTINAL ENDOSCOPY     VAGINAL HYSTERECTOMY     ovaries not excised   Patient Active Problem List   Diagnosis Date Noted   Weakness of voice 03/16/2024   Bradykinesia 02/17/2024   Physical deconditioning 01/25/2024   Abnormal ultrasound 07/27/2023   Lung nodule seen on imaging study 07/27/2023   Fatigue 07/27/2023   Constipation 06/26/2023   S/P TAVR (transcatheter aortic valve replacement) 06/21/2023   CAD (coronary artery disease)    History of CVA (cerebrovascular accident) 10/27/2022   Bilateral sensorineural hearing loss 09/22/2022   Chronic diastolic heart failure (HCC) 08/04/2022   Prediabetes 08/04/2022   Acute left-sided low back pain without sciatica 07/30/2020   Cavus deformity of foot 05/01/2020    Poor balance 05/10/2018   Left ventricular diastolic dysfunction 07/27/2017   Essential hypertension 07/27/2017   Fall 03/02/2017   Urinary incontinence 02/16/2017   Chronic venous insufficiency 08/20/2016   Xiphoid pain 03/05/2016   Insomnia 01/16/2016   Anxiety and depression 12/24/2015   Asthma, mild intermittent, well-controlled 11/24/2015   Carotid artery disease (HCC) 06/29/2011   Hypothyroidism 11/11/2010   Hyperlipidemia 11/11/2010   Senile osteoporosis 11/11/2010   RHINOSINUSITIS, RECURRENT 10/29/2008   Personal history of malignant neoplasm of breast 03/27/2008   GERD (gastroesophageal reflux disease) 03/27/2008   DIVERTICULOSIS 03/27/2008   Atherosclerosis of native coronary artery of native heart with angina pectoris (HCC) 08/23/2007   Severe aortic stenosis 08/23/2007   DVT 08/23/2007    ONSET DATE: August 2024  REFERRING DIAG: R26.89 (ICD-10-CM) - Poor balance R53.81 (ICD-10-CM) - Physical deconditioning  THERAPY DIAG:  Muscle weakness (generalized)  Difficulty in walking, not elsewhere classified  Unsteadiness on feet  Other symptoms and signs involving the musculoskeletal system  Rationale for Evaluation and Treatment: Rehabilitation  SUBJECTIVE:                                                                                                                                                                                             SUBJECTIVE STATEMENT: Doing ok, haven't been up to much today  Pt accompanied by: self  PERTINENT HISTORY: Breast CA s/p B mastectomy, CAD, cataract, depression, GERD, HLD, HTN, osteoporosis, CABG 2007, femoral-popliteal bypass graft 2024  PAIN:  Are you having pain? No   PRECAUTIONS: Fall and Other: osteoporosis  RED FLAGS: None   WEIGHT BEARING RESTRICTIONS: No  FALLS: Has patient fallen in last 6 months? 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  PATIENT GOALS: work on endurance and improve balance   OBJECTIVE:   TODAY'S TREATMENT: 03/22/24 Activity Comments  Vitals: 160/71 mmHg, 57 bpm   Seated LE PRE -LAQ 3x10 3# -seated hip add iso 3x10 -seated clamshells 3x10 red -hamstring curls 3x10 w/ red  Sidestepping Alt toe taps  X 2 min at counter 3#  Multisensory balance Standing on foam: EO/EC x 30 sec Head turns Eo/EC x 30 sec Lateral/ant-post sway EO/EC x 60 sec  HR 63 bpm post-exertion/activity                HOME EXERCISE PROGRAM Last updated: 03/06/24 Access Code: [email or web address] URL: [email or web address] Date: 03/06/2024 Prepared by: [facility] - Outpatient  Rehab - [facility]  Exercises - Sit to Stand with Counter Support  - 1 x daily - 5 x weekly - 2-3 sets - 5 reps - Heel Raises with Counter Support  - 1 x daily - 7 x weekly - 3 sets - 10 reps - Side Stepping with Counter Support  - 1 x daily - 7 x weekly - 3 sets - 1-2 min rounds hold - Feet Together Balance at [employer] Eyes Closed  - 1 x daily - 7 x weekly - 3 sets - 30 sec hold - Semi-Tandem Balance at [employer] Eyes Open  - 1 x daily - 7 x weekly - 3 sets - 30 sec hold - Backward Walking with Counter Support  - 1 x daily - 7 x weekly - 3 sets - 60 sec hold - Semi-Tandem Balance at [employer] Eyes Closed  - 1 x daily - 7 x weekly - 3 sets - 15-30 sec hold - Corner Balance Feet Together: Eyes Closed With Head Turns  - 1 x daily - 7 x weekly - 3 sets - 3 reps - Seated Bilateral Elbow Flexion with Resistance on Swiss Ball  - 1 x daily - 3 x weekly - 2 sets - 10 reps - Seated Elbow Extension with Self-Anchored Resistance  - 1 x daily - 3 x weekly - 2 sets - 10 reps - Standing Shoulder Row with Anchored Resistance  - 1 x daily - 3 x weekly - 2 sets - 10 reps   PATIENT EDUCATION: Education details:  discussed pt's HEP compliance- she is not able to verbalize how often she is performing her HEP as she reports "I don't keep records." Addressed pt's questions about her HEP and reprinted handouts in larger font; exam findings,  POC Person educated: Patient Education method: Explanation, Demonstration, Tactile cues, Verbal cues, and Handouts Education comprehension: verbalized understanding and returned demonstration     Note: Objective measures were completed at Evaluation unless otherwise noted.  Vitals: 98% spO2, 64bpm; 134/64 mmHg  DIAGNOSTIC FINDINGS: none recent  COGNITION: Overall cognitive status: Within functional limits for tasks assessed   SENSATION: Reports reduced sensation in B feet  POSTURE: rounded shoulders, forward head, and increased thoracic kyphosis  LOWER EXTREMITY MMT:    MMT (in sitting) Right Eval Left Eval  Hip flexion 3 3+  Hip extension    Hip abduction 3+ 3+  Hip adduction 3+ 3+  Hip internal rotation    Hip external rotation    Knee flexion 3+ 4-  Knee extension 3+ 4-  Ankle dorsiflexion 3 3  Ankle plantarflexion 2+ 2+  Ankle inversion    Ankle eversion    (Blank rows = not tested)  GAIT: Gait pattern: Very slow, short B  step length, occasionally tripping on cane Assistive device utilized: Single point cane Level of assistance: CGA   FUNCTIONAL TESTS:  5 times sit to stand: 47.01 sec using B UEs  94% spO2 10 meter walk test: 43.89 sec with SPC 0.75 ft/sec                                                                                                                               TREATMENT DATE: 01/31/24      GOALS: Goals reviewed with patient? Yes  SHORT TERM GOALS: Target date: 02/21/2024  Patient to be independent with initial HEP. Baseline: HEP initiated; good recall Goal status: MET    LONG TERM GOALS: Target date: 04/05/2024  Patient to be independent with advanced HEP. Baseline: Not yet initiated ; met for current  03/08/24 Goal status: IN PROGRESS 03/08/24  Patient to demonstrate B LE strength >/=4/5.  Baseline: See above; NT 03/08/24 Goal status: IN PROGRESS 03/08/24  Patient to demonstrate gait speed of at least 1.5 ft/sec in order to improve access to community.  Baseline: 0.75 ft/sec; 1.4 ft/sec 03/08/24 Goal status: IN PROGRESS 03/08/24  Patient to demonstrate 5xSTS test in <25 sec in order to decrease risk of falls.  Baseline: 47.01 sec using B UEs; 19.29 sec with B UEs 03/08/24 Goal status: MET 03/08/24  Patient to score at least 46/56 on Berg in order to decrease risk of falls.  Baseline: 41/56; 44/56 03/08/24 Goal status: IN PROGRESS 03/08/24  Patient to report plans to participate in home or community exercise program to maintain fitness.  Baseline: not currently participating; unchanged 03/08/24 Goal status: IN PROGRESS 03/08/24  Patient to report 50% improvement in ability to open car door.  Baseline: reports difficulty d/t weakness 03/08/24 Goal status: INITIAL 03/08/24    ASSESSMENT:  CLINICAL IMPRESSION: Seated LE [place] PRE to improve strength and activity tolerance with increase in resistance and repetitions which was tolerated well.  Dynamic standing balance to improve single limb support and loading response to improve stability in gait.  Static multisensory balance to improve postural stability/proprioceptive awareness with instances of LOB when actively moving on foam pad with difficulty in righting self requiring CGA-min A to correct retro-LOB. Continued sessions to progress POC details to improve mobility and reduce risk for falls.     OBJECTIVE IMPAIRMENTS: Abnormal gait, decreased activity tolerance, decreased balance, decreased endurance, decreased knowledge of use of DME, decreased strength, decreased safety awareness, and postural dysfunction.   ACTIVITY LIMITATIONS: carrying, lifting, bending, standing, squatting, stairs, transfers, bathing, toileting, dressing, reach over head,  hygiene/grooming, and locomotion level  PARTICIPATION LIMITATIONS: meal prep, cleaning, laundry, driving, shopping, community activity, and church  PERSONAL FACTORS: Age, Fitness, Past/current experiences, Time since onset of injury/illness/exacerbation, and 3+ comorbidities: Breast CA s/p B mastectomy, CAD, cataract, depression, GERD, HLD, HTN, osteoporosis, CABG 2007, femoral-popliteal bypass graft 2024 are also affecting patient's functional outcome.   REHAB POTENTIAL: Good  CLINICAL DECISION MAKING: Evolving/moderate complexity  EVALUATION COMPLEXITY: Moderate  PLAN:  PT FREQUENCY: 2x/week  PT DURATION: 4 weeks  PLANNED INTERVENTIONS: 97164- PT Re-evaluation, 97110-Therapeutic exercises, 97530- Therapeutic activity, 97112- Neuromuscular re-education, 97535- Self Care, 16109- Manual therapy, [phone]- Gait training, [phone]- Canalith repositioning, Patient/Family education, Balance training, Stair training, Taping, Dry Needling, Joint mobilization, Spinal mobilization, Vestibular training, Cryotherapy, and Moist heat  PLAN FOR NEXT SESSION: endurance training and balance activities that she can perform safely. Dynamic balance, work on functional lifting   4:13 PM, 03/22/24 M. Kelly Richard Ritchey, PT, DPT Physical Therapist- [place] Office Number: [phone]

## 2024-03-23 NOTE — Therapy (Signed)
 OUTPATIENT PHYSICAL THERAPY NEURO TREATMENT NOTE   Patient Name: [name] MRN: [id number] DOB:[birth year], 83 y.o., female Today's Date: 03/23/2024   PCP: Colene Dauphin, MD  REFERRING PROVIDER: Colene Dauphin, MD      END OF SESSION:        Past Medical History:  Diagnosis Date   Allergy     SEASONAL   Anxiety    BREAST CANCER 1991   left, 1991; B mastectomy   CAD (coronary artery disease)    s/p CABG x5 in 2007   Cataract    BILATERAL-REMOVED   Depression    DIVERTICULOSIS    GERD    History of DVT (deep vein thrombosis)    HYPERLIPIDEMIA    Hypertension    HYPOTHYROIDISM    Occlusion and stenosis of carotid artery without mention of cerebral infarction    OSTEOPOROSIS    S/P TAVR (transcatheter aortic valve replacement) 06/21/2023   s/p TAVR with a 23mm Edwards S3UR via the TF approach by Dr. Abel Hoe & Dr. Honey Lusty   Severe aortic stenosis    Past Surgical History:  Procedure Laterality Date   APPENDECTOMY     COLONOSCOPY     CORONARY ARTERY BYPASS GRAFT     5        2007   ENDARTERECTOMY FEMORAL Right 06/22/2023   Procedure: RIGHT FEMORAL ENDARTERECTOMY WITH PATCH ANGIOPLASTY;  Surgeon: Margherita Shell, MD;  Location: [facility] OR;  Service: Vascular;  Laterality: Right;   FEMORAL-POPLITEAL BYPASS GRAFT Right 06/22/2023   Procedure: FEMORAL THROMBECTOMY;  Surgeon: Margherita Shell, MD;  Location: [facility] OR;  Service: Vascular;  Laterality: Right;   INTRAOPERATIVE TRANSTHORACIC ECHOCARDIOGRAM N/A 06/21/2023   Procedure: INTRAOPERATIVE TRANSTHORACIC ECHOCARDIOGRAM;  Surgeon: Odie Benne, MD;  Location: [facility];  Service: Open Heart Surgery;  Laterality: N/A;   MASTECTOMY     left with reconstruction and lymph node excision  1992   MASTECTOMY     right with reconstruction   PATCH ANGIOPLASTY Right 06/22/2023   Procedure: PATCH ANGIOPLASTY OF RIGHT FEMORAL ARTERY USING BOVINE PERICARDIAL PATCH;  Surgeon: Margherita Shell, MD;  Location: [facility] OR;   Service: Vascular;  Laterality: Right;   REVISION RECONSTRUCTED BREAST Left 02/2014   willard (plastics in [place])   right ankle arthroscopy     RIGHT/LEFT HEART CATH AND CORONARY ANGIOGRAPHY N/A 06/09/2023   Procedure: RIGHT/LEFT HEART CATH AND CORONARY ANGIOGRAPHY;  Surgeon: Odie Benne, MD;  Location: [facility];  Service: Cardiovascular;  Laterality: N/A;   TEAR DUCT CANAL     TONSILLECTOMY AND ADENOIDECTOMY     TRANSCATHETER AORTIC VALVE REPLACEMENT, TRANSFEMORAL N/A 06/21/2023   Procedure: Transcatheter Aortic Valve Replacement, Transfemoral;  Surgeon: Odie Benne, MD;  Location: [facility];  Service: Open Heart Surgery;  Laterality: N/A;   UPPER GASTROINTESTINAL ENDOSCOPY     VAGINAL HYSTERECTOMY     ovaries not excised   Patient Active Problem List   Diagnosis Date Noted   Weakness of voice 03/16/2024   Bradykinesia 02/17/2024   Physical deconditioning 01/25/2024   Abnormal ultrasound 07/27/2023   Lung nodule seen on imaging study 07/27/2023   Fatigue 07/27/2023   Constipation 06/26/2023   S/P TAVR (transcatheter aortic valve replacement) 06/21/2023   CAD (coronary artery disease)    History of CVA (cerebrovascular accident) 10/27/2022   Bilateral sensorineural hearing loss 09/22/2022   Chronic diastolic heart failure (HCC) 08/04/2022   Prediabetes 08/04/2022   Acute left-sided low back  pain without sciatica 07/30/2020   Cavus deformity of foot 05/01/2020   Poor balance 05/10/2018   Left ventricular diastolic dysfunction 07/27/2017   Essential hypertension 07/27/2017   Fall 03/02/2017   Urinary incontinence 02/16/2017   Chronic venous insufficiency 08/20/2016   Xiphoid pain 03/05/2016   Insomnia 01/16/2016   Anxiety and depression 12/24/2015   Asthma, mild intermittent, well-controlled 11/24/2015   Carotid artery disease (HCC) 06/29/2011   Hypothyroidism 11/11/2010   Hyperlipidemia 11/11/2010   Senile osteoporosis 11/11/2010    RHINOSINUSITIS, RECURRENT 10/29/2008   Personal history of malignant neoplasm of breast 03/27/2008   GERD (gastroesophageal reflux disease) 03/27/2008   DIVERTICULOSIS 03/27/2008   Atherosclerosis of native coronary artery of native heart with angina pectoris (HCC) 08/23/2007   Severe aortic stenosis 08/23/2007   DVT 08/23/2007    ONSET DATE: August 2024  REFERRING DIAG: R26.89 (ICD-10-CM) - Poor balance R53.81 (ICD-10-CM) - Physical deconditioning  THERAPY DIAG:  No diagnosis found.  Rationale for Evaluation and Treatment: Rehabilitation  SUBJECTIVE:                                                                                                                                                                                             SUBJECTIVE STATEMENT: Doing ok, haven't been up to much today  Pt accompanied by: self  PERTINENT HISTORY: Breast CA s/p B mastectomy, CAD, cataract, depression, GERD, HLD, HTN, osteoporosis, CABG 2007, femoral-popliteal bypass graft 2024  PAIN:  Are you having pain? No   PRECAUTIONS: Fall and Other: osteoporosis  RED FLAGS: None   WEIGHT BEARING RESTRICTIONS: No  FALLS: Has patient fallen in last 6 months? 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  PATIENT GOALS: work on endurance and improve balance   OBJECTIVE:     TODAY'S TREATMENT: 03/26/24 Activity Comments                          TODAY'S TREATMENT: 03/22/24 Activity Comments  Vitals: 160/71 mmHg, 57 bpm   Seated LE PRE -LAQ 3x10 3# -seated hip add iso 3x10 -seated clamshells 3x10 red -hamstring curls 3x10 w/ red  Sidestepping Alt toe taps  X 2 min at counter 3#  Multisensory balance Standing on foam: EO/EC x 30 sec Head turns Eo/EC x 30  sec Lateral/ant-post sway EO/EC x 60 sec  HR 63 bpm post-exertion/activity  HOME EXERCISE PROGRAM Last updated: 03/06/24 Access Code: [email or web address] URL: [email or web address] Date: 03/06/2024 Prepared by: [facility] - Outpatient  Rehab - [facility]  Exercises - Sit to Stand with Counter Support  - 1 x daily - 5 x weekly - 2-3 sets - 5 reps - Heel Raises with Counter Support  - 1 x daily - 7 x weekly - 3 sets - 10 reps - Side Stepping with Counter Support  - 1 x daily - 7 x weekly - 3 sets - 1-2 min rounds hold - Feet Together Balance at [employer] Eyes Closed  - 1 x daily - 7 x weekly - 3 sets - 30 sec hold - Semi-Tandem Balance at [employer] Eyes Open  - 1 x daily - 7 x weekly - 3 sets - 30 sec hold - Backward Walking with Counter Support  - 1 x daily - 7 x weekly - 3 sets - 60 sec hold - Semi-Tandem Balance at [employer] Eyes Closed  - 1 x daily - 7 x weekly - 3 sets - 15-30 sec hold - Corner Balance Feet Together: Eyes Closed With Head Turns  - 1 x daily - 7 x weekly - 3 sets - 3 reps - Seated Bilateral Elbow Flexion with Resistance on Swiss Ball  - 1 x daily - 3 x weekly - 2 sets - 10 reps - Seated Elbow Extension with Self-Anchored Resistance  - 1 x daily - 3 x weekly - 2 sets - 10 reps - Standing Shoulder Row with Anchored Resistance  - 1 x daily - 3 x weekly - 2 sets - 10 reps   PATIENT EDUCATION: Education details: discussed pt's HEP compliance- she is not able to verbalize how often she is performing her HEP as she reports "I don't keep records." Addressed pt's questions about her HEP and reprinted handouts in larger font; exam findings,  POC Person educated: Patient Education method: Explanation, Demonstration, Tactile cues, Verbal cues, and Handouts Education comprehension: verbalized understanding and returned demonstration     Note: Objective measures were completed at Evaluation unless otherwise noted.  Vitals: 98% spO2, 64bpm;  134/64 mmHg  DIAGNOSTIC FINDINGS: none recent  COGNITION: Overall cognitive status: Within functional limits for tasks assessed   SENSATION: Reports reduced sensation in B feet  POSTURE: rounded shoulders, forward head, and increased thoracic kyphosis  LOWER EXTREMITY MMT:    MMT (in sitting) Right Eval Left Eval  Hip flexion 3 3+  Hip extension    Hip abduction 3+ 3+  Hip adduction 3+ 3+  Hip internal rotation    Hip external rotation    Knee flexion 3+ 4-  Knee extension 3+ 4-  Ankle dorsiflexion 3 3  Ankle plantarflexion 2+ 2+  Ankle inversion    Ankle eversion    (Blank rows = not tested)  GAIT: Gait pattern: Very slow, short B step length, occasionally tripping on cane Assistive device utilized: Single point cane Level of assistance: CGA   FUNCTIONAL TESTS:  5 times sit to stand: 47.01 sec using B UEs  94% spO2 10 meter walk test: 43.89 sec with SPC 0.75 ft/sec  TREATMENT DATE: 01/31/24      GOALS: Goals reviewed with patient? Yes  SHORT TERM GOALS: Target date: 02/21/2024  Patient to be independent with initial HEP. Baseline: HEP initiated; good recall Goal status: MET    LONG TERM GOALS: Target date: 04/05/2024  Patient to be independent with advanced HEP. Baseline: Not yet initiated ; met for current 03/08/24 Goal status: IN PROGRESS 03/08/24  Patient to demonstrate B LE strength >/=4/5.  Baseline: See above; NT 03/08/24 Goal status: IN PROGRESS 03/08/24  Patient to demonstrate gait speed of at least 1.5 ft/sec in order to improve access to community.  Baseline: 0.75 ft/sec; 1.4 ft/sec 03/08/24 Goal status: IN PROGRESS 03/08/24  Patient to demonstrate 5xSTS test in <25 sec in order to decrease risk of falls.  Baseline: 47.01 sec using B UEs; 19.29 sec with B UEs 03/08/24 Goal status: MET 03/08/24  Patient to score at  least 46/56 on Berg in order to decrease risk of falls.  Baseline: 41/56; 44/56 03/08/24 Goal status: IN PROGRESS 03/08/24  Patient to report plans to participate in home or community exercise program to maintain fitness.  Baseline: not currently participating; unchanged 03/08/24 Goal status: IN PROGRESS 03/08/24  Patient to report 50% improvement in ability to open car door.  Baseline: reports difficulty d/t weakness 03/08/24 Goal status: INITIAL 03/08/24    ASSESSMENT:  CLINICAL IMPRESSION: Seated LE [place] PRE to improve strength and activity tolerance with increase in resistance and repetitions which was tolerated well.  Dynamic standing balance to improve single limb support and loading response to improve stability in gait.  Static multisensory balance to improve postural stability/proprioceptive awareness with instances of LOB when actively moving on foam pad with difficulty in righting self requiring CGA-min A to correct retro-LOB. Continued sessions to progress POC details to improve mobility and reduce risk for falls.     OBJECTIVE IMPAIRMENTS: Abnormal gait, decreased activity tolerance, decreased balance, decreased endurance, decreased knowledge of use of DME, decreased strength, decreased safety awareness, and postural dysfunction.   ACTIVITY LIMITATIONS: carrying, lifting, bending, standing, squatting, stairs, transfers, bathing, toileting, dressing, reach over head, hygiene/grooming, and locomotion level  PARTICIPATION LIMITATIONS: meal prep, cleaning, laundry, driving, shopping, community activity, and church  PERSONAL FACTORS: Age, Fitness, Past/current experiences, Time since onset of injury/illness/exacerbation, and 3+ comorbidities: Breast CA s/p B mastectomy, CAD, cataract, depression, GERD, HLD, HTN, osteoporosis, CABG 2007, femoral-popliteal bypass graft 2024 are also affecting patient's functional outcome.   REHAB POTENTIAL: Good  CLINICAL DECISION MAKING:  Evolving/moderate complexity  EVALUATION COMPLEXITY: Moderate  PLAN:  PT FREQUENCY: 2x/week  PT DURATION: 4 weeks  PLANNED INTERVENTIONS: [id number]- PT Re-evaluation, 97110-Therapeutic exercises, 97530- Therapeutic activity, 97112- Neuromuscular re-education, 97535- Self Care, 16109- Manual therapy, [phone]- Gait training, [phone]- Canalith repositioning, Patient/Family education, Balance training, Stair training, Taping, Dry Needling, Joint mobilization, Spinal mobilization, Vestibular training, Cryotherapy, and Moist heat  PLAN FOR NEXT SESSION: endurance training and balance activities that she can perform safely. Dynamic balance, work on functional lifting

## 2024-03-26 ENCOUNTER — Ambulatory Visit: Admitting: Physical Therapy

## 2024-03-26 ENCOUNTER — Encounter: Payer: Self-pay | Admitting: Physical Therapy

## 2024-03-26 DIAGNOSIS — R29898 Other symptoms and signs involving the musculoskeletal system: Secondary | ICD-10-CM

## 2024-03-26 DIAGNOSIS — R2681 Unsteadiness on feet: Secondary | ICD-10-CM | POA: Diagnosis not present

## 2024-03-26 DIAGNOSIS — R262 Difficulty in walking, not elsewhere classified: Secondary | ICD-10-CM

## 2024-03-26 DIAGNOSIS — M6281 Muscle weakness (generalized): Secondary | ICD-10-CM

## 2024-03-27 ENCOUNTER — Ambulatory Visit

## 2024-03-28 NOTE — Therapy (Signed)
 OUTPATIENT PHYSICAL THERAPY NEURO TREATMENT NOTE   Patient Name: [name] MRN: [id number] DOB:12-Dec-1940, 83 y.o., female Today's Date: 03/29/2024   PCP: Colene Dauphin, MD  REFERRING PROVIDER: Colene Dauphin, MD      END OF SESSION:  PT End of Session - 03/29/24 1659     Visit Number 13    Number of Visits 17    Date for PT Re-Evaluation 04/05/24    Authorization Type Medicare/BCBS    Authorization - Number of Visits 60    Progress Note Due on Visit 19    PT Start Time 1621   pt late   PT Stop Time 1700    PT Time Calculation (min) 39 min    Activity Tolerance Patient tolerated treatment well    Behavior During Therapy WFL for tasks assessed/performed                   Past Medical History:  Diagnosis Date   Allergy     SEASONAL   Anxiety    BREAST CANCER 1991   left, 1991; B mastectomy   CAD (coronary artery disease)    s/p CABG x5 in 2007   Cataract    BILATERAL-REMOVED   Depression    DIVERTICULOSIS    GERD    History of DVT (deep vein thrombosis)    HYPERLIPIDEMIA    Hypertension    HYPOTHYROIDISM    Occlusion and stenosis of carotid artery without mention of cerebral infarction    OSTEOPOROSIS    S/P TAVR (transcatheter aortic valve replacement) 06/21/2023   s/p TAVR with a 23mm Edwards S3UR via the TF approach by Dr. Abel Hoe & Dr. Honey Lusty   Severe aortic stenosis    Past Surgical History:  Procedure Laterality Date   APPENDECTOMY     COLONOSCOPY     CORONARY ARTERY BYPASS GRAFT     5        2007   ENDARTERECTOMY FEMORAL Right 06/22/2023   Procedure: RIGHT FEMORAL ENDARTERECTOMY WITH PATCH ANGIOPLASTY;  Surgeon: Margherita Shell, MD;  Location: [facility] OR;  Service: Vascular;  Laterality: Right;   FEMORAL-POPLITEAL BYPASS GRAFT Right 06/22/2023   Procedure: FEMORAL THROMBECTOMY;  Surgeon: Margherita Shell, MD;  Location: [facility] OR;  Service: Vascular;  Laterality: Right;   INTRAOPERATIVE TRANSTHORACIC ECHOCARDIOGRAM N/A 06/21/2023   Procedure:  INTRAOPERATIVE TRANSTHORACIC ECHOCARDIOGRAM;  Surgeon: Odie Benne, MD;  Location: [facility];  Service: Open Heart Surgery;  Laterality: N/A;   MASTECTOMY     left with reconstruction and lymph node excision  1992   MASTECTOMY     right with reconstruction   PATCH ANGIOPLASTY Right 06/22/2023   Procedure: PATCH ANGIOPLASTY OF RIGHT FEMORAL ARTERY USING BOVINE PERICARDIAL PATCH;  Surgeon: Margherita Shell, MD;  Location: [facility] OR;  Service: Vascular;  Laterality: Right;   REVISION RECONSTRUCTED BREAST Left 02/2014   willard (plastics in [place])   right ankle arthroscopy     RIGHT/LEFT HEART CATH AND CORONARY ANGIOGRAPHY N/A 06/09/2023   Procedure: RIGHT/LEFT HEART CATH AND CORONARY ANGIOGRAPHY;  Surgeon: Odie Benne, MD;  Location: [facility];  Service: Cardiovascular;  Laterality: N/A;   TEAR DUCT CANAL     TONSILLECTOMY AND ADENOIDECTOMY     TRANSCATHETER AORTIC VALVE REPLACEMENT, TRANSFEMORAL N/A 06/21/2023   Procedure: Transcatheter Aortic Valve Replacement, Transfemoral;  Surgeon: Odie Benne, MD;  Location: [facility];  Service: Open Heart Surgery;  Laterality: N/A;   UPPER GASTROINTESTINAL ENDOSCOPY  VAGINAL HYSTERECTOMY     ovaries not excised   Patient Active Problem List   Diagnosis Date Noted   Weakness of voice 03/16/2024   Bradykinesia 02/17/2024   Physical deconditioning 01/25/2024   Abnormal ultrasound 07/27/2023   Lung nodule seen on imaging study 07/27/2023   Fatigue 07/27/2023   Constipation 06/26/2023   S/P TAVR (transcatheter aortic valve replacement) 06/21/2023   CAD (coronary artery disease)    History of CVA (cerebrovascular accident) 10/27/2022   Bilateral sensorineural hearing loss 09/22/2022   Chronic diastolic heart failure (HCC) 08/04/2022   Prediabetes 08/04/2022   Acute left-sided low back pain without sciatica 07/30/2020   Cavus deformity of foot 05/01/2020   Poor balance 05/10/2018   Left  ventricular diastolic dysfunction 07/27/2017   Essential hypertension 07/27/2017   Fall 03/02/2017   Urinary incontinence 02/16/2017   Chronic venous insufficiency 08/20/2016   Xiphoid pain 03/05/2016   Insomnia 01/16/2016   Anxiety and depression 12/24/2015   Asthma, mild intermittent, well-controlled 11/24/2015   Carotid artery disease (HCC) 06/29/2011   Hypothyroidism 11/11/2010   Hyperlipidemia 11/11/2010   Senile osteoporosis 11/11/2010   RHINOSINUSITIS, RECURRENT 10/29/2008   Personal history of malignant neoplasm of breast 03/27/2008   GERD (gastroesophageal reflux disease) 03/27/2008   DIVERTICULOSIS 03/27/2008   Atherosclerosis of native coronary artery of native heart with angina pectoris (HCC) 08/23/2007   Severe aortic stenosis 08/23/2007   DVT 08/23/2007    ONSET DATE: August 2024  REFERRING DIAG: R26.89 (ICD-10-CM) - Poor balance R53.81 (ICD-10-CM) - Physical deconditioning  THERAPY DIAG:  Muscle weakness (generalized)  Difficulty in walking, not elsewhere classified  Unsteadiness on feet  Other symptoms and signs involving the musculoskeletal system  Rationale for Evaluation and Treatment: Rehabilitation  SUBJECTIVE:                                                                                                                                                                                             SUBJECTIVE STATEMENT: The vascular doctor told me that my TAVR is to blame for amost losing my leg. I'm still out of breath. HEP is "doing pretty good."  Pt accompanied by: self  PERTINENT HISTORY: Breast CA s/p B mastectomy, CAD, cataract, depression, GERD, HLD, HTN, osteoporosis, CABG 2007, femoral-popliteal bypass graft 2024  PAIN:  Are you having pain? Yes: NPRS scale: "moderate" Pain location: B knee Pain description: achey Aggravating factors: stairs Relieving factors: mobic     PRECAUTIONS: Fall and Other: osteoporosis  RED  FLAGS: None   WEIGHT BEARING RESTRICTIONS: No  FALLS: Has patient fallen in last 6 months? No  LIVING ENVIRONMENT: Lives with: lives  alone Lives in: Other townhome Stairs: 7 steps to enter with railing Has following equipment at home: Single point cane, Walker - 2 wheeled, and Grab bars  PLOF: Independent; monthly cleaning service  PATIENT GOALS: work on endurance and improve balance   OBJECTIVE:     TODAY'S TREATMENT: 03/29/24 Activity Comments  Vitals  at start of session 143/67 mmHg, 60 bpm, 95%   L4 x 7 min UEs/LEs  91%, 62 bpm, 158/71 bpm   Superset:  overhead press with 3# each hand sidestepping 2# x 1 min 90% 53 bpm  Cues to keep elbows straight; SBA for sidestepping   Superset:  counter top pushups 2x10 step ups 6" with 1 UE support 2x Required verbal cueing to maintain alternating steps           HOME EXERCISE PROGRAM Last updated: 03/06/24 Access Code: [email or web address] URL: [email or web address] Date: 03/06/2024 Prepared by: [place] - Outpatient  Rehab - [facility]  Exercises - Sit to Stand with Counter Support  - 1 x daily - 5 x weekly - 2-3 sets - 5 reps - Heel Raises with Counter Support  - 1 x daily - 7 x weekly - 3 sets - 10 reps - Side Stepping with Counter Support  - 1 x daily - 7 x weekly - 3 sets - 1-2 min rounds hold - Feet Together Balance at [employer] Eyes Closed  - 1 x daily - 7 x weekly - 3 sets - 30 sec hold - Semi-Tandem Balance at [employer] Eyes Open  - 1 x daily - 7 x weekly - 3 sets - 30 sec hold - Backward Walking with Counter Support  - 1 x daily - 7 x weekly - 3 sets - 60 sec hold - Semi-Tandem Balance at [employer] Eyes Closed  - 1 x daily - 7 x weekly - 3 sets - 15-30 sec hold - Corner Balance Feet Together: Eyes Closed With Head Turns  - 1 x daily - 7 x weekly - 3 sets - 3 reps - Seated Bilateral Elbow Flexion with Resistance on Swiss Ball  - 1 x daily - 3 x weekly - 2 sets - 10 reps - Seated Elbow  Extension with Self-Anchored Resistance  - 1 x daily - 3 x weekly - 2 sets - 10 reps - Standing Shoulder Row with Anchored Resistance  - 1 x daily - 3 x weekly - 2 sets - 10 reps     Note: Objective measures were completed at Evaluation unless otherwise noted.  Vitals: 98% spO2, 64bpm; 134/64 mmHg  DIAGNOSTIC FINDINGS: none recent  COGNITION: Overall cognitive status: Within functional limits for tasks assessed   SENSATION: Reports reduced sensation in B feet  POSTURE: rounded shoulders, forward head, and increased thoracic kyphosis  LOWER EXTREMITY MMT:    MMT (in sitting) Right Eval Left Eval  Hip flexion 3 3+  Hip extension    Hip abduction 3+ 3+  Hip adduction 3+ 3+  Hip internal rotation    Hip external rotation    Knee flexion 3+ 4-  Knee extension 3+ 4-  Ankle dorsiflexion 3 3  Ankle plantarflexion 2+ 2+  Ankle inversion    Ankle eversion    (Blank rows = not tested)  GAIT: Gait pattern: Very slow, short B step length, occasionally tripping on cane Assistive device utilized: Single point cane Level of assistance: CGA   FUNCTIONAL TESTS:  5 times sit to stand: 47.01 sec using B UEs  94% spO2 10 meter walk test: 43.89 sec with SPC 0.75 ft/sec                                                                                                                               TREATMENT DATE: 01/31/24      GOALS: Goals reviewed with patient? Yes  SHORT TERM GOALS: Target date: 02/21/2024  Patient to be independent with initial HEP. Baseline: HEP initiated; good recall Goal status: MET    LONG TERM GOALS: Target date: 04/05/2024  Patient to be independent with advanced HEP. Baseline: Not yet initiated ; met for current 03/08/24 Goal status: IN PROGRESS 03/08/24  Patient to demonstrate B LE strength >/=4/5.  Baseline: See above; NT 03/08/24 Goal status: IN PROGRESS 03/08/24  Patient to demonstrate gait speed of at least 1.5 ft/sec in order to improve  access to community.  Baseline: 0.75 ft/sec; 1.4 ft/sec 03/08/24 Goal status: IN PROGRESS 03/08/24  Patient to demonstrate 5xSTS test in <25 sec in order to decrease risk of falls.  Baseline: 47.01 sec using B UEs; 19.29 sec with B UEs 03/08/24 Goal status: MET 03/08/24  Patient to score at least 46/56 on Berg in order to decrease risk of falls.  Baseline: 41/56; 44/56 03/08/24 Goal status: IN PROGRESS 03/08/24  Patient to report plans to participate in home or community exercise program to maintain fitness.  Baseline: not currently participating; unchanged 03/08/24 Goal status: IN PROGRESS 03/08/24  Patient to report 50% improvement in ability to open car door.  Baseline: reports difficulty d/t weakness 03/08/24 Goal status: INITIAL 03/08/24    ASSESSMENT:  CLINICAL IMPRESSION: Patient arrived to session without complaints. Continued working on UE and LE strengthening and endurance. Patient was able to perform multiple exercises without rest break without significant change in vitals. Verbal cueing required occasionally for form. No complaints upon leaving.    OBJECTIVE IMPAIRMENTS: Abnormal gait, decreased activity tolerance, decreased balance, decreased endurance, decreased knowledge of use of DME, decreased strength, decreased safety awareness, and postural dysfunction.   ACTIVITY LIMITATIONS: carrying, lifting, bending, standing, squatting, stairs, transfers, bathing, toileting, dressing, reach over head, hygiene/grooming, and locomotion level  PARTICIPATION LIMITATIONS: meal prep, cleaning, laundry, driving, shopping, community activity, and church  PERSONAL FACTORS: Age, Fitness, Past/current experiences, Time since onset of injury/illness/exacerbation, and 3+ comorbidities: Breast CA s/p B mastectomy, CAD, cataract, depression, GERD, HLD, HTN, osteoporosis, CABG 2007, femoral-popliteal bypass graft 2024 are also affecting patient's functional outcome.   REHAB POTENTIAL:  Good  CLINICAL DECISION MAKING: Evolving/moderate complexity  EVALUATION COMPLEXITY: Moderate  PLAN:  PT FREQUENCY: 2x/week  PT DURATION: 4 weeks  PLANNED INTERVENTIONS: 97164- PT Re-evaluation, 97110-Therapeutic exercises, 97530- Therapeutic activity, 97112- Neuromuscular re-education, 97535- Self Care, 82956- Manual therapy, [phone]- Gait training, [phone]- Canalith repositioning, Patient/Family education, Balance training, Stair training, Taping, Dry Needling, Joint mobilization, Spinal mobilization, Vestibular training, Cryotherapy, and Moist heat  PLAN FOR NEXT SESSION: endurance training and balance activities that she can perform  safely. Dynamic balance, work on functional lifting    [name], [place], DPT 03/29/24 5:05 PM  [facility] at [facility] [address] [place], [address] [place], [place] [address] Phone # [phone] Fax # [phone]

## 2024-03-29 ENCOUNTER — Encounter: Payer: Self-pay | Admitting: Cardiovascular Disease

## 2024-03-29 ENCOUNTER — Ambulatory Visit: Payer: Medicare Other | Attending: Cardiovascular Disease | Admitting: Cardiovascular Disease

## 2024-03-29 ENCOUNTER — Encounter: Payer: Self-pay | Admitting: Physical Therapy

## 2024-03-29 ENCOUNTER — Ambulatory Visit: Admitting: Physical Therapy

## 2024-03-29 VITALS — BP 106/70 | HR 52 | Ht 62.0 in | Wt 117.4 lb

## 2024-03-29 DIAGNOSIS — M6281 Muscle weakness (generalized): Secondary | ICD-10-CM

## 2024-03-29 DIAGNOSIS — Z952 Presence of prosthetic heart valve: Secondary | ICD-10-CM | POA: Diagnosis not present

## 2024-03-29 DIAGNOSIS — I5032 Chronic diastolic (congestive) heart failure: Secondary | ICD-10-CM

## 2024-03-29 DIAGNOSIS — R29898 Other symptoms and signs involving the musculoskeletal system: Secondary | ICD-10-CM

## 2024-03-29 DIAGNOSIS — I10 Essential (primary) hypertension: Secondary | ICD-10-CM | POA: Diagnosis present

## 2024-03-29 DIAGNOSIS — R262 Difficulty in walking, not elsewhere classified: Secondary | ICD-10-CM

## 2024-03-29 DIAGNOSIS — I25119 Atherosclerotic heart disease of native coronary artery with unspecified angina pectoris: Secondary | ICD-10-CM

## 2024-03-29 DIAGNOSIS — R2681 Unsteadiness on feet: Secondary | ICD-10-CM

## 2024-03-29 NOTE — Assessment & Plan Note (Signed)
 Blood pressure control is ideal.  Continue current management.  Treated with losartan  and metoprolol  at low-dose.

## 2024-03-29 NOTE — Assessment & Plan Note (Signed)
 Stable with no angina.  Continue aspirin  and atorvastatin  at current doses.

## 2024-03-29 NOTE — Patient Instructions (Signed)
 Follow-Up: At [facility], you and your health needs are our priority.  As part of our continuing mission to provide you with exceptional heart care, our providers are all part of one team.  This team includes your primary Cardiologist (physician) and Advanced Practice Providers or APPs (Physician Assistants and Nurse Practitioners) who all work together to provide you with the care you need, when you need it.  Your next appointment:   6 month(s)  Provider:   Arnoldo Lapping, MD

## 2024-03-29 NOTE — Progress Notes (Signed)
 Cardiology Office Note:    Date:  03/29/2024   ID:  Angelica, Mullen August 18, 1941, MRN [id number]  PCP:  Colene Dauphin, MD   [facility] Providers Cardiologist:  Arnoldo Lapping, MD     Referring MD: Colene Dauphin, MD   Chief Complaint  Patient presents with   Coronary Artery Disease    History of Present Illness:    [name] is a 83 y.o. female presenting for follow-up evaluation. The patient has coronary artery disease dating back to her initial presentation in 2007 when she required multivessel CABG. She developed aortic stenosis and underwent TAVR in August 2024, treated with a 20 mm SAPIEN 3 valve complicated by an ischemic right leg with need for surgical repair of an occluded right femoral artery. Her postoperative valve function has been normal.   The patient is here alone today.  She is not feeling well.  She continues to have problems with profound fatigue, changes in her legs, and somnolence.  She just does not have good energy now over the last several months.  She denies any new cardiac symptoms of chest pain, chest pressure, or progressive dyspnea.  She has had mild leg edema but no orthopnea or PND.   Current Medications: Current Meds  Medication Sig   alendronate  (FOSAMAX ) 70 MG tablet Take 1 tablet (70 mg total) by mouth every 7 (seven) days. Take with a full glass of water  on an empty stomach.   aspirin  81 MG tablet Take 81 mg by mouth daily.     atorvastatin  (LIPITOR) 20 MG tablet TAKE 1 TABLET BY MOUTH EVERY DAY   Calcium  Carbonate (CALTRATE 600 PO) Take 1 tablet by mouth 2 (two) times daily.     citalopram  (CELEXA ) 20 MG tablet TAKE 1 TABLET BY MOUTH EVERY DAY   co-enzyme Q-10 30 MG capsule Take 30 mg by mouth daily.   famotidine  (PEPCID ) 40 MG tablet TAKE 1 TABLET BY MOUTH EVERY DAY   fluticasone  (FLONASE ) 50 MCG/ACT nasal spray Place 1 spray into both nostrils daily as needed for allergies or rhinitis.   levothyroxine  (SYNTHROID ) 75 MCG tablet  TAKE 1 TABLET BY MOUTH EVERY MORNING BEFORE BREAKFAST   losartan  (COZAAR ) 25 MG tablet Take 1 tablet (25 mg total) by mouth daily. (Patient taking differently: Take 25 mg by mouth daily. Per patient taking 1/2 tablet (12.5 mg) per PCP)   meloxicam  (MOBIC ) 7.5 MG tablet TAKE 1/2 TO 1 TABLET EVERY DAY   metoprolol  tartrate (LOPRESSOR ) 25 MG tablet TAKE 1 TABLET BY MOUTH TWICE A DAY   Multiple Vitamins-Minerals (MULTIVITAMIN) LIQD Take 1 tablet by mouth daily.   Multiple Vitamins-Minerals (PRESERVISION AREDS 2) CAPS Take 1 capsule by mouth 2 (two) times daily.   senna-docusate (SENOKOT-S) 8.6-50 MG per tablet Take 1 tablet by mouth daily.     VITAMIN D , CHOLECALCIFEROL, PO Take 1 tablet by mouth daily.   zolpidem  (AMBIEN ) 5 MG tablet TAKE 1 TABLET (5 MG TOTAL) BY MOUTH EVERY DAY AT BEDTIME AS NEEDED FOR SLEEP   [DISCONTINUED] furosemide  (LASIX ) 20 MG tablet TAKE 1 TABLET BY MOUTH EVERY DAY     Allergies:   Clarithromycin, Clindamycin, Codeine, Levofloxacin, Morphine, Naproxen, Omeprazole , Penicillins, and Pneumococcal vaccines   ROS:   Please see the history of present illness.    All other systems reviewed and are negative.  EKGs/Labs/Other Studies Reviewed:    The following studies were reviewed today: Cardiac Studies & Procedures   ______________________________________________________________________________________________ CARDIAC CATHETERIZATION  CARDIAC CATHETERIZATION 06/09/2023  Conclusion   Mid RCA lesion is 100% stenosed.   Prox Cx lesion is 99% stenosed.   Ost LAD to Prox LAD lesion is 90% stenosed.   SVG graft was visualized by angiography and is normal in caliber.   SVG graft was visualized by angiography and is normal in caliber.   LIMA graft was visualized by angiography and is normal in caliber.  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 Patent sequential vein graft to the LAD and Diagonal Severe proximal  Circumflex stenosis. Patent LIMA graft to the obtuse marginal branch Chronic total occlusion of the mid RCA. The PDA and posterolateral arteries fill from the sequential vein graft. Elevated right heart pressures RA 7 RV 47/7/9 PA 48/19 mean 35 PCWP 26   Recommendations: Continue workup for TAVR. Medical management of CAD  Findings Coronary Findings Diagnostic  Dominance: Right  Left Anterior Descending Ost LAD to Prox LAD lesion is 90% stenosed.  Left Circumflex Vessel is large. Prox Cx lesion is 99% stenosed.  Right Coronary Artery Vessel is large. Mid RCA lesion is 100% stenosed. The lesion is chronically occluded.  Sequential Saphenous Graft To 2nd RPL, RPDA SVG graft was visualized by angiography and is normal in caliber.  Sequential Saphenous Graft To 1st Diag, Mid LAD SVG graft was visualized by angiography and is normal in caliber.  LIMA LIMA Graft To 2nd Mrg LIMA graft was visualized by angiography and is normal in caliber.  Intervention  No interventions have been documented.   STRESS TESTS  MYOCARDIAL PERFUSION IMAGING 12/17/2021  Narrative   The study is normal. Findings are consistent with no prior ischemia and no prior myocardial infarction. The study is low risk.   No ST deviation was noted.   LV perfusion is normal. There is no evidence of ischemia. There is no evidence of infarction.   Left ventricular function is normal. Nuclear stress EF: 71 %. The left ventricular ejection fraction is hyperdynamic (>65%). End diastolic cavity size is normal. End systolic cavity size is normal.   Prior study available for comparison from 09/02/2017. No changes compared to prior study.   ECHOCARDIOGRAM  ECHOCARDIOGRAM COMPLETE 07/25/2023  Narrative ECHOCARDIOGRAM REPORT    Patient Name:   [name] Date of Exam: 07/25/2023 Medical Rec #:  [id number]     Height:       62.0 in Accession #:    [id number]    Weight:       125.0 lb Date of Birth:  Aug 19, 1941       BSA:          1.566 m Patient Age:    82 years      BP:           132/58 mmHg Patient Gender: F             HR:           57 bpm. Exam Location:  [address]  Procedure: 2D Echo, Color Doppler, Cardiac Doppler and 3D Echo  Indications:    Post TAVR Evaluation Z95.2  History:        Patient has prior history of Echocardiogram examinations, most recent 06/22/2023. CAD, Prior CABG; Risk Factors:Hypertension and Dyslipidemia.  Sonographer:    Joleen Navy RDCS Referring Phys: [phone] JILL D MCDANIEL  IMPRESSIONS   1. Left ventricular ejection fraction, by estimation, is 65 to 70%. The left ventricle has normal function. The left  ventricle has no regional wall motion abnormalities. There is mild left ventricular hypertrophy. Left ventricular diastolic parameters are consistent with Grade III diastolic dysfunction (restrictive). Elevated left atrial pressure. 2. Right ventricular systolic function is normal. The right ventricular size is normal. There is mildly elevated pulmonary artery systolic pressure. 3. Left atrial size was moderately dilated. 4. The mitral valve is normal in structure. Mild mitral valve regurgitation. 5. S/p TAVR (23 mm Edwards S3UR; procedure date 06/21/23) Peak and mean gradients through the valve are 17 and 9 mm HG There is mild perivallvular regurgitation.. The aortic valve has been repaired/replaced. Procedure Date: 06/21/2023. 6. Pulmonic valve regurgitation is moderate. 7. The inferior vena cava is normal in size with greater than 50% respiratory variability, suggesting right atrial pressure of 3 mmHg.  FINDINGS Left Ventricle: Left ventricular ejection fraction, by estimation, is 65 to 70%. The left ventricle has normal function. The left ventricle has no regional wall motion abnormalities. The left ventricular internal cavity size was normal in size. There is mild left ventricular hypertrophy. Left ventricular diastolic parameters are consistent with Grade III  diastolic dysfunction (restrictive). Elevated left atrial pressure.  Right Ventricle: The right ventricular size is normal. Right vetricular wall thickness was not assessed. Right ventricular systolic function is normal. There is mildly elevated pulmonary artery systolic pressure. The tricuspid regurgitant velocity is 3.21 m/s, and with an assumed right atrial pressure of 3 mmHg, the estimated right ventricular systolic pressure is 44.2 mmHg.  Left Atrium: Left atrial size was moderately dilated.  Right Atrium: Right atrial size was normal in size.  Pericardium: There is no evidence of pericardial effusion.  Mitral Valve: The mitral valve is normal in structure. Mild mitral valve regurgitation.  Tricuspid Valve: The tricuspid valve is normal in structure. Tricuspid valve regurgitation is mild.  Aortic Valve: S/p TAVR (23 mm Edwards S3UR; procedure date 06/21/23) Peak and mean gradients through the valve are 17 and 9 mm HG There is mild perivallvular regurgitation. The aortic valve has been repaired/replaced. Aortic valve mean gradient measures 9.0 mmHg. Aortic valve peak gradient measures 16.8 mmHg. Aortic valve area, by VTI measures 1.74 cm. There is a 20 mm Edwards Sapien 3 Ultra Resilia valve present in the aortic position.  Pulmonic Valve: The pulmonic valve was not well visualized. Pulmonic valve regurgitation is moderate.  Aorta: The aortic root and ascending aorta are structurally normal, with no evidence of dilitation.  Venous: The inferior vena cava is normal in size with greater than 50% respiratory variability, suggesting right atrial pressure of 3 mmHg.  IAS/Shunts: No atrial level shunt detected by color flow Doppler.   LEFT VENTRICLE PLAX 2D LVIDd:         3.60 cm   Diastology LVIDs:         2.30 cm   LV e' medial:    3.30 cm/s LV PW:         1.00 cm   LV E/e' medial:  49.4 LV IVS:        1.20 cm   LV e' lateral:   5.50 cm/s LVOT diam:     2.00 cm   LV E/e' lateral:  29.6 LV SV:         88 LV SV Index:   56 LVOT Area:     3.14 cm  3D Volume EF: 3D EF:        69 % LV EDV:       131 ml LV ESV:  41 ml LV SV:        90 ml  RIGHT VENTRICLE RV S prime:     6.73 cm/s TAPSE (M-mode): 1.3 cm  LEFT ATRIUM           Index        RIGHT ATRIUM           Index LA diam:      3.70 cm 2.36 cm/m   RA Area:     11.20 cm LA Vol (A4C): 69.1 ml 44.14 ml/m  RA Volume:   20.30 ml  12.97 ml/m AORTIC VALVE AV Area (Vmax):    1.55 cm AV Area (Vmean):   1.59 cm AV Area (VTI):     1.74 cm AV Vmax:           205.00 cm/s AV Vmean:          139.000 cm/s AV VTI:            0.506 m AV Peak Grad:      16.8 mmHg AV Mean Grad:      9.0 mmHg LVOT Vmax:         101.00 cm/s LVOT Vmean:        70.400 cm/s LVOT VTI:          0.281 m LVOT/AV VTI ratio: 0.56  AORTA Ao Root diam: 3.00 cm Ao Asc diam:  3.80 cm  MITRAL VALVE                TRICUSPID VALVE MV Area (PHT): 2.95 cm     TR Peak grad:   41.2 mmHg MV Decel Time: 257 msec     TR Vmax:        321.00 cm/s MV E velocity: 163.00 cm/s MV A velocity: 44.90 cm/s   SHUNTS MV E/A ratio:  3.63         Systemic VTI:  0.28 m Systemic Diam: 2.00 cm  Ola Berger MD Electronically signed by Ola Berger MD Signature Date/Time: 07/25/2023/6:33:17 PM    Final      CT SCANS  CT CORONARY MORPH W/CTA COR W/SCORE 03/08/2023  Addendum 03/08/2023  1:17 PM ADDENDUM REPORT: 03/08/2023 13:14  CLINICAL DATA:  Aortic stenosis  EXAM: Cardiac TAVR CT  TECHNIQUE: The patient was scanned on a Siemens Force 192 slice scanner. A 120 kV retrospective scan was triggered in the descending thoracic aorta at 111 HU's. Gantry rotation speed was 270 msecs and collimation was .9 mm. No beta blockade or nitro were given. The 3D data set was reconstructed in 5% intervals of the R-R cycle. Systolic and diastolic phases were analyzed on a dedicated work station using MPR, MIP and VRT modes. The patient received 80 cc of  contrast.  FINDINGS: Aortic Valve: Functionally bicuspid with fused right and left cusp AV with calcium  score 874  Aorta: No aneurysm normal arch vessels moderate calcific atherosclerosis  Sinotubular Junction: 26.4 mm  Ascending Thoracic Aorta: 34 mm  Aortic Arch: 25 mm  Descending Thoracic Aorta: 24 mm  Sinus of Valsalva Measurements:  Non-coronary: 31.4 mm  Height 23.1 mm  Right - coronary: 28.6 mm  Height 16.2 mm  Left - coronary: 30.8 mm  Height 18.4 mm  Coronary Artery Height above Annulus:  Left Main: 13.9 mm above annulus  Right Coronary: 9.3 mm above annulus  Virtual Basal Annulus Measurements:  Maximum/Minimum Diameter: 23.3 mm x 18.6 mm Average diameter 21.2 mm  Perimeter: 67.7 mm  Area: 353 mm2  Coronary  Arteries: Sufficient height above annulus for deployment and patient has by pass grafts  Optimum Fluoroscopic Angle for Delivery: LAO 7 Caudal 6 degrees  IMPRESSION: 1. Functionally bicuspid AV with fused right and left cusp score 874  2. Annular area of 353 mm2 suitable for a 23 mm Sapien 3 valve Alternatively a 26 Medtronic Evolut valve would fit  3.  Optimum angiographic angle for deployment LAO 7 Caudal 6 degrees  4.  Coronary arteries sufficient height above annulus for deployment  5.  There are 2 patent SVGls and a patent LIMA  6.  Membranous septal length 9.5 mm  Janelle Mediate   Electronically Signed By: Janelle Mediate M.D. On: 03/08/2023 13:14  Narrative EXAM: OVER-READ INTERPRETATION  CT CHEST  The following report is a limited chest CT over-read performed by radiologist Dr. Avelino Lek of [facility] on 03/08/2023. This over-read does not include interpretation of cardiac or coronary anatomy or pathology. The cardiac TAVR interpretation by the cardiologist is attached.  COMPARISON:  None Available.  FINDINGS: Extracardiac findings will be described separately under dictation for contemporaneously  obtained CTA chest, abdomen and pelvis.  IMPRESSION: Please see separate dictation for contemporaneously obtained CTA chest, abdomen and pelvis dated 03/08/2023 for full description of relevant extracardiac findings.  Electronically Signed: By: Avelino Lek M.D. On: 03/08/2023 11:53     ______________________________________________________________________________________________      EKG:   EKG Interpretation Date/Time:  Thursday Mar 29 2024 13:46:12 EDT Ventricular Rate:  52 PR Interval:  188 QRS Duration:  82 QT Interval:  448 QTC Calculation: 416 R Axis:   63  Text Interpretation: Sinus bradycardia Anterior infarct (cited on or before 22-Jun-2023) When compared with ECG of 08-Sep-2023 15:37, Criteria for Inferior infarct are no longer Present Confirmed by Arnoldo Lapping [phone]) on 03/29/2024 5:33:38 PM    Recent Labs: 06/02/2023: B Natriuretic Peptide 224.4 06/26/2023: Magnesium  2.0 01/25/2024: ALT 27; BUN 16; Creatinine, Ser 0.66; Hemoglobin 12.2; Platelets 246.0; Potassium 4.3; Sodium 135; TSH 0.80  Recent Lipid Panel    Component Value Date/Time   CHOL 122 01/25/2024 1527   CHOL 134 07/28/2018 1153   TRIG 52.0 01/25/2024 1527   HDL 54.70 01/25/2024 1527   HDL 52 07/28/2018 1153   CHOLHDL 2 01/25/2024 1527   VLDL 10.4 01/25/2024 1527   LDLCALC 57 01/25/2024 1527   LDLCALC 71 07/30/2020 1631     Risk Assessment/Calculations:                Physical Exam:    VS:  BP 106/70   Pulse (!) 52   Ht 5\' 2"  (1.575 m)   Wt 117 lb 6.4 oz (53.3 kg)   SpO2 95%   BMI 21.47 kg/m     Wt Readings from Last 3 Encounters:  03/29/24 117 lb 6.4 oz (53.3 kg)  03/19/24 119 lb (54 kg)  03/16/24 119 lb (54 kg)     GEN: Pleasant elderly woman in no acute distress HEENT: Normal NECK: No JVD; No carotid bruits LYMPHATICS: No lymphadenopathy CARDIAC: RRR, 2/6 systolic murmur at the right upper sternal border RESPIRATORY:  Clear to auscultation without rales,  wheezing or rhonchi  ABDOMEN: Soft, non-tender, non-distended MUSCULOSKELETAL: Trace bilateral pretibial edema; No deformity  SKIN: Warm and dry NEUROLOGIC:  Alert and oriented x 3 PSYCHIATRIC:  Normal affect   Assessment & Plan S/P TAVR (transcatheter aortic valve replacement) Last echo reviewed from September 2024 demonstrating vigorous LV systolic function, restrictive physiology with regard to diastolic filling,  normal RV function, and normal function of her TAVR prosthesis with a mean gradient of 9 mmHg. Coronary artery disease involving native coronary artery of native heart with angina pectoris (HCC) Stable with no angina.  Continue aspirin  and atorvastatin  at current doses. Chronic heart failure with preserved ejection fraction (HCC) Does not appear volume overloaded.  Only taking furosemide  as needed.  Blood pressure is under optimal control.  No medication changes today. Essential hypertension Blood pressure control is ideal.  Continue current management.  Treated with losartan  and metoprolol  at low-dose.  For the patient's profound fatigue, I reviewed recent labs that appear to all be within normal limits including her TSH, hemoglobin A1c, blood counts, kidney and liver function, and electrolytes.  She has an upcoming evaluation scheduled with neurology next month.  She is scheduled for repeat echocardiogram in August which will be 1 year out from her TAVR procedure.  I encouraged her to continue with physical therapy as tolerated.          Medication Adjustments/Labs and Tests Ordered: Current medicines are reviewed at length with the patient today.  Concerns regarding medicines are outlined above.  Orders Placed This Encounter  Procedures   EKG 12-Lead   No orders of the defined types were placed in this encounter.   Patient Instructions   Follow-Up: At [facility], you and your health needs are our priority.  As part of our continuing mission to provide you  with exceptional heart care, our providers are all part of one team.  This team includes your primary Cardiologist (physician) and Advanced Practice Providers or APPs (Physician Assistants and Nurse Practitioners) who all work together to provide you with the care you need, when you need it.  Your next appointment:   6 month(s)  Provider:   Arnoldo Lapping, MD       Signed, Arnoldo Lapping, MD  03/29/2024 5:34 PM    [facility]

## 2024-03-29 NOTE — Assessment & Plan Note (Signed)
 Last echo reviewed from September 2024 demonstrating vigorous LV systolic function, restrictive physiology with regard to diastolic filling, normal RV function, and normal function of her TAVR prosthesis with a mean gradient of 9 mmHg.

## 2024-03-30 NOTE — Therapy (Signed)
 OUTPATIENT PHYSICAL THERAPY NEURO TREATMENT NOTE   Patient Name: [name] MRN: [id number] DOB:[birth year], 83 y.o., female Today's Date: 04/02/2024   PCP: Colene Dauphin, MD  REFERRING PROVIDER: Colene Dauphin, MD      END OF SESSION:  PT End of Session - 04/02/24 1658     Visit Number 14    Number of Visits 17    Date for PT Re-Evaluation 04/05/24    Authorization Type Medicare/BCBS    Authorization - Number of Visits 60    Progress Note Due on Visit 19    PT Start Time 1619    PT Stop Time 1700    PT Time Calculation (min) 41 min    Equipment Utilized During Treatment Gait belt    Activity Tolerance Patient tolerated treatment well    Behavior During Therapy WFL for tasks assessed/performed                    Past Medical History:  Diagnosis Date   Allergy     SEASONAL   Anxiety    BREAST CANCER 1991   left, 1991; B mastectomy   CAD (coronary artery disease)    s/p CABG x5 in 2007   Cataract    BILATERAL-REMOVED   Depression    DIVERTICULOSIS    GERD    History of DVT (deep vein thrombosis)    HYPERLIPIDEMIA    Hypertension    HYPOTHYROIDISM    Occlusion and stenosis of carotid artery without mention of cerebral infarction    OSTEOPOROSIS    S/P TAVR (transcatheter aortic valve replacement) 06/21/2023   s/p TAVR with a 23mm Edwards S3UR via the TF approach by Dr. Abel Hoe & Dr. Honey Lusty   Severe aortic stenosis    Past Surgical History:  Procedure Laterality Date   APPENDECTOMY     COLONOSCOPY     CORONARY ARTERY BYPASS GRAFT     5        2007   ENDARTERECTOMY FEMORAL Right 06/22/2023   Procedure: RIGHT FEMORAL ENDARTERECTOMY WITH PATCH ANGIOPLASTY;  Surgeon: Margherita Shell, MD;  Location: [facility] OR;  Service: Vascular;  Laterality: Right;   FEMORAL-POPLITEAL BYPASS GRAFT Right 06/22/2023   Procedure: FEMORAL THROMBECTOMY;  Surgeon: Margherita Shell, MD;  Location: [facility] OR;  Service: Vascular;  Laterality: Right;   INTRAOPERATIVE TRANSTHORACIC  ECHOCARDIOGRAM N/A 06/21/2023   Procedure: INTRAOPERATIVE TRANSTHORACIC ECHOCARDIOGRAM;  Surgeon: Odie Benne, MD;  Location: [facility];  Service: Open Heart Surgery;  Laterality: N/A;   MASTECTOMY     left with reconstruction and lymph node excision  1992   MASTECTOMY     right with reconstruction   PATCH ANGIOPLASTY Right 06/22/2023   Procedure: PATCH ANGIOPLASTY OF RIGHT FEMORAL ARTERY USING BOVINE PERICARDIAL PATCH;  Surgeon: Margherita Shell, MD;  Location: [facility] OR;  Service: Vascular;  Laterality: Right;   REVISION RECONSTRUCTED BREAST Left 02/2014   willard (plastics in [place])   right ankle arthroscopy     RIGHT/LEFT HEART CATH AND CORONARY ANGIOGRAPHY N/A 06/09/2023   Procedure: RIGHT/LEFT HEART CATH AND CORONARY ANGIOGRAPHY;  Surgeon: Odie Benne, MD;  Location: [facility];  Service: Cardiovascular;  Laterality: N/A;   TEAR DUCT CANAL     TONSILLECTOMY AND ADENOIDECTOMY     TRANSCATHETER AORTIC VALVE REPLACEMENT, TRANSFEMORAL N/A 06/21/2023   Procedure: Transcatheter Aortic Valve Replacement, Transfemoral;  Surgeon: Odie Benne, MD;  Location: [facility];  Service: Open Heart Surgery;  Laterality: N/A;   UPPER GASTROINTESTINAL ENDOSCOPY     VAGINAL HYSTERECTOMY     ovaries not excised   Patient Active Problem List   Diagnosis Date Noted   Weakness of voice 03/16/2024   Bradykinesia 02/17/2024   Physical deconditioning 01/25/2024   Abnormal ultrasound 07/27/2023   Lung nodule seen on imaging study 07/27/2023   Fatigue 07/27/2023   Constipation 06/26/2023   S/P TAVR (transcatheter aortic valve replacement) 06/21/2023   CAD (coronary artery disease)    History of CVA (cerebrovascular accident) 10/27/2022   Bilateral sensorineural hearing loss 09/22/2022   Chronic diastolic heart failure (HCC) 08/04/2022   Prediabetes 08/04/2022   Acute left-sided low back pain without sciatica 07/30/2020   Cavus deformity of foot 05/01/2020    Poor balance 05/10/2018   Left ventricular diastolic dysfunction 07/27/2017   Essential hypertension 07/27/2017   Fall 03/02/2017   Urinary incontinence 02/16/2017   Chronic venous insufficiency 08/20/2016   Xiphoid pain 03/05/2016   Insomnia 01/16/2016   Anxiety and depression 12/24/2015   Asthma, mild intermittent, well-controlled 11/24/2015   Carotid artery disease (HCC) 06/29/2011   Hypothyroidism 11/11/2010   Hyperlipidemia 11/11/2010   Senile osteoporosis 11/11/2010   RHINOSINUSITIS, RECURRENT 10/29/2008   Personal history of malignant neoplasm of breast 03/27/2008   GERD (gastroesophageal reflux disease) 03/27/2008   DIVERTICULOSIS 03/27/2008   Atherosclerosis of native coronary artery of native heart with angina pectoris (HCC) 08/23/2007   Severe aortic stenosis 08/23/2007   DVT 08/23/2007    ONSET DATE: August 2024  REFERRING DIAG: R26.89 (ICD-10-CM) - Poor balance R53.81 (ICD-10-CM) - Physical deconditioning  THERAPY DIAG:  Muscle weakness (generalized)  Difficulty in walking, not elsewhere classified  Unsteadiness on feet  Other symptoms and signs involving the musculoskeletal system  Rationale for Evaluation and Treatment: Rehabilitation  SUBJECTIVE:                                                                                                                                                                                             SUBJECTIVE STATEMENT: Nothing new, no falls.   Pt accompanied by: self  PERTINENT HISTORY: Breast CA s/p B mastectomy, CAD, cataract, depression, GERD, HLD, HTN, osteoporosis, CABG 2007, femoral-popliteal bypass graft 2024  PAIN:  Are you having pain? Yes: NPRS scale: "no" Pain location: B knee Pain description: achey Aggravating factors: stairs Relieving factors: mobic     PRECAUTIONS: Fall and Other: osteoporosis  RED FLAGS: None   WEIGHT BEARING RESTRICTIONS: No  FALLS: Has patient fallen in last 6 months?  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 Instead of 2nd set of squats, pt performed LAQ with 2# d/t c/o knee pain   96% 61bpm  B biceps curls 3# 2x10 standing HS curls 2# 2x10 96%, 58bpm    PATIENT EDUCATION: Education details: edu on progress towards goals, discussed exercise classes for seniors and recommended A.C.Elva Hamburger by Eustaquio Hight, POC Person educated: Patient Education method: Explanation and Demonstration Education comprehension: verbalized understanding     HOME EXERCISE PROGRAM Last updated: 03/06/24 Access Code: [email or web address] URL: [email or web address] Date: 03/06/2024 Prepared by: [facility] - Outpatient  Rehab - [facility]  Exercises - Sit to Stand with Counter Support  - 1 x daily - 5 x weekly - 2-3 sets - 5 reps - Heel Raises with Counter Support  - 1 x daily - 7 x weekly - 3 sets - 10 reps - Side Stepping with Counter Support  - 1 x daily - 7 x weekly - 3 sets - 1-2 min rounds hold - Feet Together Balance at [employer] Eyes Closed  - 1 x daily - 7 x weekly - 3 sets - 30 sec hold - Semi-Tandem Balance at [employer] Eyes Open  - 1 x daily - 7 x weekly - 3 sets - 30 sec hold - Backward Walking with Counter Support  - 1 x daily - 7 x weekly - 3 sets - 60 sec hold - Semi-Tandem Balance at [employer] Eyes Closed  - 1 x daily - 7 x weekly - 3 sets - 15-30 sec hold - Corner Balance Feet Together: Eyes Closed With Head Turns  - 1 x daily - 7 x weekly - 3 sets - 3 reps - Seated Bilateral Elbow Flexion with Resistance  on Swiss Ball  - 1 x daily - 3 x weekly - 2 sets - 10 reps - Seated Elbow Extension with Self-Anchored Resistance  - 1 x daily - 3 x weekly - 2 sets - 10 reps - Standing Shoulder Row with Anchored Resistance  - 1 x daily - 3 x weekly - 2 sets - 10 reps     Note: Objective measures were completed at Evaluation unless otherwise noted.  Vitals: 98% spO2, 64bpm; 134/64 mmHg  DIAGNOSTIC FINDINGS: none recent  COGNITION: Overall cognitive status: Within functional limits for tasks assessed   SENSATION: Reports reduced sensation in B feet  POSTURE: rounded shoulders, forward head, and increased thoracic kyphosis  LOWER EXTREMITY MMT:    MMT (in sitting) Right Eval Left Eval  Hip flexion 3 3+  Hip extension    Hip abduction 3+ 3+  Hip adduction 3+ 3+  Hip internal rotation    Hip external rotation    Knee flexion 3+ 4-  Knee extension 3+ 4-  Ankle dorsiflexion 3 3  Ankle plantarflexion 2+ 2+  Ankle inversion    Ankle eversion    (Blank rows = not tested)  GAIT: Gait pattern: Very slow, short B step length, occasionally tripping on cane Assistive device utilized: Single point cane Level of assistance: CGA   FUNCTIONAL TESTS:  5 times sit to stand: 47.01 sec using B UEs  94% spO2 10 meter walk test: 43.89 sec with SPC 0.75 ft/sec                                                                                                                               TREATMENT DATE: 01/31/24      GOALS: Goals reviewed with patient? Yes  SHORT TERM GOALS: Target date: 02/21/2024  Patient to be independent with initial HEP. Baseline: HEP initiated; good recall Goal status: MET    LONG TERM GOALS: Target date: 04/05/2024  Patient to be independent with advanced HEP. Baseline: Not yet initiated ; met for current 03/08/24 Goal status: IN PROGRESS 03/08/24  Patient to demonstrate B LE strength >/=4/5.  Baseline: See above; NT 03/08/24 Goal status: IN PROGRESS 03/08/24  Patient  to demonstrate gait speed of at least 1.5 ft/sec in order to improve access to community.  Baseline: 0.75 ft/sec; 1.4 ft/sec 03/08/24 Goal status: IN PROGRESS 03/08/24  Patient to demonstrate 5xSTS test in <25 sec in order to decrease risk of falls.  Baseline: 47.01 sec using B UEs; 19.29 sec with B UEs 03/08/24 Goal status: MET 03/08/24  Patient to score at least 46/56 on Berg in order to decrease risk of falls.  Baseline: 41/56; 44/56 03/08/24 Goal status: IN PROGRESS 03/08/24  Patient to report plans to participate in home or community exercise program to maintain fitness.  Baseline: not currently participating; unchanged 03/08/24 Goal status: IN PROGRESS 03/08/24  Patient to report 50% improvement in ability to open car door.  Baseline: reports difficulty d/t weakness 03/08/24 Goal status: INITIAL 03/08/24    ASSESSMENT:  CLINICAL IMPRESSION: Patient arrived to session without new complaints. Continued working on progressive UE and LE strengthening. Patient continues to demonstrate improved tolerance for strengthening tasks. Modifications provided d/t c/o knee pain with squats. Patient is progressing well towards goals; reports remaining imbalance that will need additional practice. No complaints at end of session.    OBJECTIVE IMPAIRMENTS: Abnormal gait, decreased activity tolerance, decreased balance, decreased endurance, decreased knowledge of use of DME, decreased strength, decreased safety awareness, and postural dysfunction.   ACTIVITY LIMITATIONS: carrying, lifting, bending, standing, squatting, stairs, transfers, bathing, toileting, dressing, reach over head, hygiene/grooming, and locomotion level  PARTICIPATION LIMITATIONS: meal prep, cleaning, laundry, driving, shopping, community activity, and church  PERSONAL FACTORS: Age, Fitness, Past/current experiences, Time since onset of injury/illness/exacerbation, and 3+ comorbidities: Breast CA s/p B mastectomy, CAD, cataract,  depression, GERD, HLD, HTN, osteoporosis, CABG 2007, femoral-popliteal bypass graft 2024 are also affecting patient's functional outcome.   REHAB POTENTIAL: Good  CLINICAL DECISION MAKING: Evolving/moderate complexity  EVALUATION COMPLEXITY: Moderate  PLAN:  PT FREQUENCY: 2x/week  PT DURATION: 4 weeks  PLANNED INTERVENTIONS: 97164- PT Re-evaluation, 97110-Therapeutic exercises, 97530- Therapeutic activity, 97112- Neuromuscular re-education, 97535- Self Care, 16109- Manual therapy, [phone]- Gait training, [phone]- Canalith repositioning, Patient/Family education, Balance training, Stair training, Taping, Dry Needling, Joint mobilization, Spinal mobilization, Vestibular training, Cryotherapy, and Moist heat  PLAN  FOR NEXT SESSION: endurance training and balance activities that she can perform safely. Dynamic balance, work on functional lifting    [name], [place], DPT 04/02/24 5:04 PM  [facility] at [facility] [address] [place], [address] [place], [place] [address] Phone # [phone] Fax # [phone]

## 2024-04-02 ENCOUNTER — Ambulatory Visit: Admitting: Physical Therapy

## 2024-04-02 ENCOUNTER — Encounter: Payer: Self-pay | Admitting: Physical Therapy

## 2024-04-02 DIAGNOSIS — M6281 Muscle weakness (generalized): Secondary | ICD-10-CM | POA: Diagnosis not present

## 2024-04-02 DIAGNOSIS — R29898 Other symptoms and signs involving the musculoskeletal system: Secondary | ICD-10-CM

## 2024-04-02 DIAGNOSIS — R2681 Unsteadiness on feet: Secondary | ICD-10-CM

## 2024-04-02 DIAGNOSIS — R262 Difficulty in walking, not elsewhere classified: Secondary | ICD-10-CM | POA: Diagnosis not present

## 2024-04-03 NOTE — Therapy (Signed)
 OUTPATIENT PHYSICAL THERAPY NEURO PROGRESS NOTE   Patient Name: [name] MRN: [id number] DOB:02-Mar-1941, 83 y.o., female Today's Date: 04/04/2024   PCP: Colene Dauphin, MD  REFERRING PROVIDER: Colene Dauphin, MD   Progress Note Reporting Period 03/20/24 to 04/04/24  See note below for Objective Data and Assessment of Progress/Goals.       END OF SESSION:  PT End of Session - 04/04/24 1702     Visit Number 15    Number of Visits 21    Date for PT Re-Evaluation 05/23/24    Authorization Type Medicare/BCBS    Authorization - Number of Visits 60    Progress Note Due on Visit 25    PT Start Time 1618    PT Stop Time 1700    PT Time Calculation (min) 42 min    Equipment Utilized During Treatment Gait belt    Activity Tolerance Patient tolerated treatment well    Behavior During Therapy WFL for tasks assessed/performed                     Past Medical History:  Diagnosis Date   Allergy     SEASONAL   Anxiety    BREAST CANCER 1991   left, 1991; B mastectomy   CAD (coronary artery disease)    s/p CABG x5 in 2007   Cataract    BILATERAL-REMOVED   Depression    DIVERTICULOSIS    GERD    History of DVT (deep vein thrombosis)    HYPERLIPIDEMIA    Hypertension    HYPOTHYROIDISM    Occlusion and stenosis of carotid artery without mention of cerebral infarction    OSTEOPOROSIS    S/P TAVR (transcatheter aortic valve replacement) 06/21/2023   s/p TAVR with a 23mm Edwards S3UR via the TF approach by Dr. Abel Hoe & Dr. Honey Lusty   Severe aortic stenosis    Past Surgical History:  Procedure Laterality Date   APPENDECTOMY     COLONOSCOPY     CORONARY ARTERY BYPASS GRAFT     5        2007   ENDARTERECTOMY FEMORAL Right 06/22/2023   Procedure: RIGHT FEMORAL ENDARTERECTOMY WITH PATCH ANGIOPLASTY;  Surgeon: Margherita Shell, MD;  Location: [facility] OR;  Service: Vascular;  Laterality: Right;   FEMORAL-POPLITEAL BYPASS GRAFT Right 06/22/2023   Procedure: FEMORAL  THROMBECTOMY;  Surgeon: Margherita Shell, MD;  Location: [facility] OR;  Service: Vascular;  Laterality: Right;   INTRAOPERATIVE TRANSTHORACIC ECHOCARDIOGRAM N/A 06/21/2023   Procedure: INTRAOPERATIVE TRANSTHORACIC ECHOCARDIOGRAM;  Surgeon: Odie Benne, MD;  Location: [facility];  Service: Open Heart Surgery;  Laterality: N/A;   MASTECTOMY     left with reconstruction and lymph node excision  1992   MASTECTOMY     right with reconstruction   PATCH ANGIOPLASTY Right 06/22/2023   Procedure: PATCH ANGIOPLASTY OF RIGHT FEMORAL ARTERY USING BOVINE PERICARDIAL PATCH;  Surgeon: Margherita Shell, MD;  Location: [facility] OR;  Service: Vascular;  Laterality: Right;   REVISION RECONSTRUCTED BREAST Left 02/2014   willard (plastics in [place])   right ankle arthroscopy     RIGHT/LEFT HEART CATH AND CORONARY ANGIOGRAPHY N/A 06/09/2023   Procedure: RIGHT/LEFT HEART CATH AND CORONARY ANGIOGRAPHY;  Surgeon: Odie Benne, MD;  Location: [facility];  Service: Cardiovascular;  Laterality: N/A;   TEAR DUCT CANAL     TONSILLECTOMY AND ADENOIDECTOMY     TRANSCATHETER AORTIC VALVE REPLACEMENT, TRANSFEMORAL N/A 06/21/2023   Procedure: Transcatheter  Aortic Valve Replacement, Transfemoral;  Surgeon: Odie Benne, MD;  Location: [facility];  Service: Open Heart Surgery;  Laterality: N/A;   UPPER GASTROINTESTINAL ENDOSCOPY     VAGINAL HYSTERECTOMY     ovaries not excised   Patient Active Problem List   Diagnosis Date Noted   Weakness of voice 03/16/2024   Bradykinesia 02/17/2024   Physical deconditioning 01/25/2024   Abnormal ultrasound 07/27/2023   Lung nodule seen on imaging study 07/27/2023   Fatigue 07/27/2023   Constipation 06/26/2023   S/P TAVR (transcatheter aortic valve replacement) 06/21/2023   CAD (coronary artery disease)    History of CVA (cerebrovascular accident) 10/27/2022   Bilateral sensorineural hearing loss 09/22/2022   Chronic diastolic heart failure (HCC)  13/24/4010   Prediabetes 08/04/2022   Acute left-sided low back pain without sciatica 07/30/2020   Cavus deformity of foot 05/01/2020   Poor balance 05/10/2018   Left ventricular diastolic dysfunction 07/27/2017   Essential hypertension 07/27/2017   Fall 03/02/2017   Urinary incontinence 02/16/2017   Chronic venous insufficiency 08/20/2016   Xiphoid pain 03/05/2016   Insomnia 01/16/2016   Anxiety and depression 12/24/2015   Asthma, mild intermittent, well-controlled 11/24/2015   Carotid artery disease (HCC) 06/29/2011   Hypothyroidism 11/11/2010   Hyperlipidemia 11/11/2010   Senile osteoporosis 11/11/2010   RHINOSINUSITIS, RECURRENT 10/29/2008   Personal history of malignant neoplasm of breast 03/27/2008   GERD (gastroesophageal reflux disease) 03/27/2008   DIVERTICULOSIS 03/27/2008   Atherosclerosis of native coronary artery of native heart with angina pectoris (HCC) 08/23/2007   Severe aortic stenosis 08/23/2007   DVT 08/23/2007    ONSET DATE: August 2024  REFERRING DIAG: R26.89 (ICD-10-CM) - Poor balance R53.81 (ICD-10-CM) - Physical deconditioning  THERAPY DIAG:  Muscle weakness (generalized)  Difficulty in walking, not elsewhere classified  Unsteadiness on feet  Other symptoms and signs involving the musculoskeletal system  Rationale for Evaluation and Treatment: Rehabilitation  SUBJECTIVE:                                                                                                                                                                                             SUBJECTIVE STATEMENT: Nothing new.   Pt accompanied by: self  PERTINENT HISTORY: Breast CA s/p B mastectomy, CAD, cataract, depression, GERD, HLD, HTN, osteoporosis, CABG 2007, femoral-popliteal bypass graft 2024  PAIN:  Are you having pain? Yes: NPRS scale: "no" Pain location: B knee Pain description: achey Aggravating factors: stairs Relieving factors: mobic     PRECAUTIONS: Fall  and Other: osteoporosis  RED FLAGS: None   WEIGHT BEARING RESTRICTIONS: No  FALLS: Has patient fallen in  last 6 months? 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  PATIENT GOALS: work on endurance and improve balance   OBJECTIVE:     TODAY'S TREATMENT: 04/04/24 Activity Comments  Vitals at start of session 140/71 mmHg, 53 bpm  25M walk 16.64 sec with cane(1.97 ft/sec) , 18.64 sec without cane (1.79 ft/sec)  Berg 49/56  5xSTS 18.13 sec B UE          LOWER EXTREMITY MMT:    MMT (in sitting) Right Eval Left Eval R 04/04/24 L 04/04/24  Hip flexion 3 3+ 4+ 4  Hip extension      Hip abduction 3+ 3+ 4+ 4+  Hip adduction 3+ 3+ 4+ 4+  Hip internal rotation      Hip external rotation      Knee flexion 3+ 4- 4+ 5  Knee extension 3+ 4- 4+ 5  Ankle dorsiflexion 3 3 4+ 4+  Ankle plantarflexion 2+ 2+ 4+ 4+  Ankle inversion      Ankle eversion      (Blank rows = not tested)   PATIENT EDUCATION: Education details: edu on exam findings and progress towards goals; edu on benefits of ST Person educated: Patient Education method: Explanation, Demonstration, Tactile cues, and Verbal cues Education comprehension: verbalized understanding     HOME EXERCISE PROGRAM Last updated: 03/06/24 Access Code: [email or web address] URL: [email or web address] Date: 03/06/2024 Prepared by: [facility] - Outpatient  Rehab - [facility]  Exercises - Sit to Stand with Counter Support  - 1 x daily - 5 x weekly - 2-3 sets - 5 reps - Heel Raises with Counter Support  - 1 x daily - 7 x weekly - 3 sets - 10 reps - Side Stepping with Counter Support  - 1 x daily - 7 x weekly - 3 sets - 1-2 min rounds hold - Feet Together Balance at [employer] Eyes Closed  - 1 x daily - 7 x weekly - 3 sets -  30 sec hold - Semi-Tandem Balance at [employer] Eyes Open  - 1 x daily - 7 x weekly - 3 sets - 30 sec hold - Backward Walking with Counter Support  - 1 x daily - 7 x weekly - 3 sets - 60 sec hold - Semi-Tandem Balance at [employer] Eyes Closed  - 1 x daily - 7 x weekly - 3 sets - 15-30 sec hold - Corner Balance Feet Together: Eyes Closed With Head Turns  - 1 x daily - 7 x weekly - 3 sets - 3 reps - Seated Bilateral Elbow Flexion with Resistance on Swiss Ball  - 1 x daily - 3 x weekly - 2 sets - 10 reps - Seated Elbow Extension with Self-Anchored Resistance  - 1 x daily - 3 x weekly - 2 sets - 10 reps - Standing Shoulder Row with Anchored Resistance  - 1 x daily - 3 x weekly - 2 sets - 10 reps     Note: Objective measures were completed at Evaluation unless otherwise noted.  Vitals: 98% spO2, 64bpm; 134/64 mmHg  DIAGNOSTIC FINDINGS: none recent  COGNITION: Overall cognitive status: Within functional limits for tasks assessed   SENSATION: Reports reduced sensation in B feet  POSTURE: rounded shoulders, forward head, and increased thoracic kyphosis  LOWER EXTREMITY MMT:  MMT (in sitting) Right Eval Left Eval  Hip flexion 3 3+  Hip extension    Hip abduction 3+ 3+  Hip adduction 3+ 3+  Hip internal rotation    Hip external rotation    Knee flexion 3+ 4-  Knee extension 3+ 4-  Ankle dorsiflexion 3 3  Ankle plantarflexion 2+ 2+  Ankle inversion    Ankle eversion    (Blank rows = not tested)  GAIT: Gait pattern: Very slow, short B step length, occasionally tripping on cane Assistive device utilized: Single point cane Level of assistance: CGA   FUNCTIONAL TESTS:  5 times sit to stand: 47.01 sec using B UEs  94% spO2 10 meter walk test: 43.89 sec with SPC 0.75 ft/sec                                                                                                                               TREATMENT DATE: 01/31/24      GOALS: Goals reviewed with patient?  Yes  SHORT TERM GOALS: Target date: 02/21/2024  Patient to be independent with initial HEP. Baseline: HEP initiated; good recall Goal status: MET    LONG TERM GOALS: Target date: 05/23/2024  Patient to be independent with advanced HEP. Baseline: Not yet initiated ; met for current 03/08/24 Goal status: IN PROGRESS 03/08/24  Patient to demonstrate B LE strength >/=4/5.  Baseline: See above; NT 03/08/24; MET 04/04/24 Goal status: MET 04/04/24  Patient to demonstrate gait speed of at least 1.5 ft/sec in order to improve access to community.  Baseline: 0.75 ft/sec; 1.4 ft/sec 03/08/24; 16.64 sec with cane(1.97 ft/sec) , 18.64 sec without cane (1.79 ft/sec) 04/04/24 Goal status: MET 04/04/24  Patient to demonstrate 5xSTS test in <25 sec in order to decrease risk of falls.  Baseline: 47.01 sec using B UEs; 19.29 sec with B UEs 03/08/24 Goal status: MET 03/08/24  Patient to demonstrate 5xSTS test in <25 sec without UEs in order to decrease risk of falls.  Baseline: 19.29 sec with B UEs 03/08/24 Goal status: INITIAL 04/04/24  Patient to score at least 46/56 on Berg in order to decrease risk of falls.  Baseline: 41/56; 44/56 03/08/24; 49/56 04/04/24 Goal status: MET 04/04/24  Patient to report plans to participate in home or community exercise program to maintain fitness.  Baseline: not currently participating; unchanged 03/08/24; pt is looking into [facility] 04/04/24  Goal status: IN PROGRESS 04/04/24   Patient to report 50% improvement in ability to open car door.  Baseline: reports difficulty d/t weakness 03/08/24; reports remaining difficulty 04/04/24 Goal status: IN PROGRESS 04/04/24    ASSESSMENT:  CLINICAL IMPRESSION: Patient arrived to session without new complaints. Reports remaining difficulty opening her car door d/t weakness. Pt reports continued hypophonia without known cause- agreeable to ST, thus will request referral. Strength testing revealed significant improvement in all muscle groups.  Gait speed has improved to 1.9 ft/sec with her cane. Patient scored 49/56 on Berg,  indicating an decreased risk of falls. Patient has met several goals at this time; goals updated to address remaining impairments. Would benefit from additional skilled PT services 1x/week for 6 weeks to address remaining goals.    OBJECTIVE IMPAIRMENTS: Abnormal gait, decreased activity tolerance, decreased balance, decreased endurance, decreased knowledge of use of DME, decreased strength, decreased safety awareness, and postural dysfunction.   ACTIVITY LIMITATIONS: carrying, lifting, bending, standing, squatting, stairs, transfers, bathing, toileting, dressing, reach over head, hygiene/grooming, and locomotion level  PARTICIPATION LIMITATIONS: meal prep, cleaning, laundry, driving, shopping, community activity, and church  PERSONAL FACTORS: Age, Fitness, Past/current experiences, Time since onset of injury/illness/exacerbation, and 3+ comorbidities: Breast CA s/p B mastectomy, CAD, cataract, depression, GERD, HLD, HTN, osteoporosis, CABG 2007, femoral-popliteal bypass graft 2024 are also affecting patient's functional outcome.   REHAB POTENTIAL: Good  CLINICAL DECISION MAKING: Evolving/moderate complexity  EVALUATION COMPLEXITY: Moderate  PLAN:  PT FREQUENCY: 2x/week  PT DURATION: 4 weeks  PLANNED INTERVENTIONS: 97164- PT Re-evaluation, 97110-Therapeutic exercises, 97530- Therapeutic activity, 97112- Neuromuscular re-education, 97535- Self Care, 40981- Manual therapy, [phone]- Gait training, [phone]- Canalith repositioning, Patient/Family education, Balance training, Stair training, Taping, Dry Needling, Joint mobilization, Spinal mobilization, Vestibular training, Cryotherapy, and Moist heat  PLAN FOR NEXT SESSION: work towards STS without UEs, overhead lifting and simulating opening car door for UE strength, dynamic balance    [name], PT, DPT 04/04/24 5:03 PM  [facility] at [facility] [address], [address] [place], [place] [address] Phone # [phone] Fax # [phone]

## 2024-04-04 ENCOUNTER — Ambulatory Visit: Admitting: Physical Therapy

## 2024-04-04 ENCOUNTER — Encounter: Payer: Self-pay | Admitting: Physical Therapy

## 2024-04-04 ENCOUNTER — Telehealth: Payer: Self-pay | Admitting: Physical Therapy

## 2024-04-04 DIAGNOSIS — R2681 Unsteadiness on feet: Secondary | ICD-10-CM | POA: Diagnosis not present

## 2024-04-04 DIAGNOSIS — M6281 Muscle weakness (generalized): Secondary | ICD-10-CM | POA: Diagnosis not present

## 2024-04-04 DIAGNOSIS — R498 Other voice and resonance disorders: Secondary | ICD-10-CM

## 2024-04-04 DIAGNOSIS — R29898 Other symptoms and signs involving the musculoskeletal system: Secondary | ICD-10-CM | POA: Diagnosis not present

## 2024-04-04 DIAGNOSIS — R262 Difficulty in walking, not elsewhere classified: Secondary | ICD-10-CM

## 2024-04-04 NOTE — Telephone Encounter (Signed)
 Hi Dr. Donnette Gal,  I am seeing [name] in OPPT per your referral for deconditioning. The patient would benefit from ST evaluation for hypophonia.    If you agree, please place an order in [facility] in [facility] or fax the order to [phone].  Thank you,  [name], PT, DPT 04/04/24 5:09 PM  [facility] at [facility] [address] [place], [address] [place], [place] [address] Phone # [phone] Fax # [phone]

## 2024-04-04 NOTE — Addendum Note (Signed)
 Addended by: Colene Dauphin on: 04/04/2024 09:11 PM   Modules accepted: Orders

## 2024-04-04 NOTE — Telephone Encounter (Signed)
 Referral ordered

## 2024-04-18 ENCOUNTER — Ambulatory Visit: Attending: Internal Medicine

## 2024-04-18 DIAGNOSIS — M6281 Muscle weakness (generalized): Secondary | ICD-10-CM

## 2024-04-18 DIAGNOSIS — R29898 Other symptoms and signs involving the musculoskeletal system: Secondary | ICD-10-CM | POA: Diagnosis not present

## 2024-04-18 DIAGNOSIS — R2681 Unsteadiness on feet: Secondary | ICD-10-CM | POA: Diagnosis not present

## 2024-04-18 DIAGNOSIS — R262 Difficulty in walking, not elsewhere classified: Secondary | ICD-10-CM

## 2024-04-18 NOTE — Therapy (Signed)
 OUTPATIENT PHYSICAL THERAPY NEURO TREATMENT   Patient Name: [name] MRN: [id number] DOB:[birth year], 83 y.o., female Today's Date: 04/18/2024   PCP: Colene Dauphin, MD  REFERRING PROVIDER: Colene Dauphin, MD     END OF SESSION:  PT End of Session - 04/18/24 1619     Visit Number 16    Number of Visits 21    Date for PT Re-Evaluation 05/23/24    Authorization Type Medicare/BCBS    Authorization - Number of Visits 60    Progress Note Due on Visit 25    PT Start Time 1618    PT Stop Time 1700    PT Time Calculation (min) 42 min    Equipment Utilized During Treatment Gait belt    Activity Tolerance Patient tolerated treatment well    Behavior During Therapy WFL for tasks assessed/performed                     Past Medical History:  Diagnosis Date   Allergy     SEASONAL   Anxiety    BREAST CANCER 1991   left, 1991; B mastectomy   CAD (coronary artery disease)    s/p CABG x5 in 2007   Cataract    BILATERAL-REMOVED   Depression    DIVERTICULOSIS    GERD    History of DVT (deep vein thrombosis)    HYPERLIPIDEMIA    Hypertension    HYPOTHYROIDISM    Occlusion and stenosis of carotid artery without mention of cerebral infarction    OSTEOPOROSIS    S/P TAVR (transcatheter aortic valve replacement) 06/21/2023   s/p TAVR with a 23mm Edwards S3UR via the TF approach by Dr. Abel Hoe & Dr. Honey Lusty   Severe aortic stenosis    Past Surgical History:  Procedure Laterality Date   APPENDECTOMY     COLONOSCOPY     CORONARY ARTERY BYPASS GRAFT     5        2007   ENDARTERECTOMY FEMORAL Right 06/22/2023   Procedure: RIGHT FEMORAL ENDARTERECTOMY WITH PATCH ANGIOPLASTY;  Surgeon: Margherita Shell, MD;  Location: [facility] OR;  Service: Vascular;  Laterality: Right;   FEMORAL-POPLITEAL BYPASS GRAFT Right 06/22/2023   Procedure: FEMORAL THROMBECTOMY;  Surgeon: Margherita Shell, MD;  Location: [facility] OR;  Service: Vascular;  Laterality: Right;   INTRAOPERATIVE TRANSTHORACIC  ECHOCARDIOGRAM N/A 06/21/2023   Procedure: INTRAOPERATIVE TRANSTHORACIC ECHOCARDIOGRAM;  Surgeon: Odie Benne, MD;  Location: [facility];  Service: Open Heart Surgery;  Laterality: N/A;   MASTECTOMY     left with reconstruction and lymph node excision  1992   MASTECTOMY     right with reconstruction   PATCH ANGIOPLASTY Right 06/22/2023   Procedure: PATCH ANGIOPLASTY OF RIGHT FEMORAL ARTERY USING BOVINE PERICARDIAL PATCH;  Surgeon: Margherita Shell, MD;  Location: [facility] OR;  Service: Vascular;  Laterality: Right;   REVISION RECONSTRUCTED BREAST Left 02/2014   willard (plastics in [place])   right ankle arthroscopy     RIGHT/LEFT HEART CATH AND CORONARY ANGIOGRAPHY N/A 06/09/2023   Procedure: RIGHT/LEFT HEART CATH AND CORONARY ANGIOGRAPHY;  Surgeon: Odie Benne, MD;  Location: [facility];  Service: Cardiovascular;  Laterality: N/A;   TEAR DUCT CANAL     TONSILLECTOMY AND ADENOIDECTOMY     TRANSCATHETER AORTIC VALVE REPLACEMENT, TRANSFEMORAL N/A 06/21/2023   Procedure: Transcatheter Aortic Valve Replacement, Transfemoral;  Surgeon: Odie Benne, MD;  Location: [facility];  Service: Open Heart Surgery;  Laterality:  N/A;   UPPER GASTROINTESTINAL ENDOSCOPY     VAGINAL HYSTERECTOMY     ovaries not excised   Patient Active Problem List   Diagnosis Date Noted   Weakness of voice 03/16/2024   Bradykinesia 02/17/2024   Physical deconditioning 01/25/2024   Abnormal ultrasound 07/27/2023   Lung nodule seen on imaging study 07/27/2023   Fatigue 07/27/2023   Constipation 06/26/2023   S/P TAVR (transcatheter aortic valve replacement) 06/21/2023   CAD (coronary artery disease)    History of CVA (cerebrovascular accident) 10/27/2022   Bilateral sensorineural hearing loss 09/22/2022   Chronic diastolic heart failure (HCC) 08/04/2022   Prediabetes 08/04/2022   Acute left-sided low back pain without sciatica 07/30/2020   Cavus deformity of foot 05/01/2020    Poor balance 05/10/2018   Left ventricular diastolic dysfunction 07/27/2017   Essential hypertension 07/27/2017   Fall 03/02/2017   Urinary incontinence 02/16/2017   Chronic venous insufficiency 08/20/2016   Xiphoid pain 03/05/2016   Insomnia 01/16/2016   Anxiety and depression 12/24/2015   Asthma, mild intermittent, well-controlled 11/24/2015   Carotid artery disease (HCC) 06/29/2011   Hypothyroidism 11/11/2010   Hyperlipidemia 11/11/2010   Senile osteoporosis 11/11/2010   RHINOSINUSITIS, RECURRENT 10/29/2008   Personal history of malignant neoplasm of breast 03/27/2008   GERD (gastroesophageal reflux disease) 03/27/2008   DIVERTICULOSIS 03/27/2008   Atherosclerosis of native coronary artery of native heart with angina pectoris (HCC) 08/23/2007   Severe aortic stenosis 08/23/2007   DVT 08/23/2007    ONSET DATE: August 2024  REFERRING DIAG: R26.89 (ICD-10-CM) - Poor balance R53.81 (ICD-10-CM) - Physical deconditioning  THERAPY DIAG:  Muscle weakness (generalized)  Difficulty in walking, not elsewhere classified  Unsteadiness on feet  Other symptoms and signs involving the musculoskeletal system  Rationale for Evaluation and Treatment: Rehabilitation  SUBJECTIVE:                                                                                                                                                                                             SUBJECTIVE STATEMENT: Doing ok, no new issues  Pt accompanied by: self  PERTINENT HISTORY: Breast CA s/p B mastectomy, CAD, cataract, depression, GERD, HLD, HTN, osteoporosis, CABG 2007, femoral-popliteal bypass graft 2024  PAIN:  Are you having pain? Yes: NPRS scale: "no" Pain location: B knee Pain description: achey Aggravating factors: stairs Relieving factors: mobic     PRECAUTIONS: Fall and Other: osteoporosis  RED FLAGS: None   WEIGHT BEARING RESTRICTIONS: No  FALLS: Has patient fallen in last 6 months?  No  LIVING ENVIRONMENT: Lives with: lives alone Lives in: Other townhome Stairs: 7 steps to enter with  railing Has following equipment at home: Single point cane, Walker - 2 wheeled, and Grab bars  PLOF: Independent; monthly cleaning service  PATIENT GOALS: work on endurance and improve balance   OBJECTIVE:   TODAY'S TREATMENT: 04/18/24 Activity Comments  Vitals: 148/66 mmHg, 66 bpm, 94%   LE PRE -LAQ 3x10 3# -hip add iso 3x10 -seated march 3x10 3#  Reports SOB/fatigue--94% O2, 55-61 bpm   Alt toe taps 3x10 6" step, 3# ankle weights, rest between sets, HR 59 bpm during exertion  Sidestep x 2 min 3#         TODAY'S TREATMENT: 04/04/24 Activity Comments  Vitals at start of session 140/71 mmHg, 53 bpm  24M walk 16.64 sec with cane(1.97 ft/sec) , 18.64 sec without cane (1.79 ft/sec)  Berg 49/56  5xSTS 18.13 sec B UE          LOWER EXTREMITY MMT:    MMT (in sitting) Right Eval Left Eval R 04/04/24 L 04/04/24  Hip flexion 3 3+ 4+ 4  Hip extension      Hip abduction 3+ 3+ 4+ 4+  Hip adduction 3+ 3+ 4+ 4+  Hip internal rotation      Hip external rotation      Knee flexion 3+ 4- 4+ 5  Knee extension 3+ 4- 4+ 5  Ankle dorsiflexion 3 3 4+ 4+  Ankle plantarflexion 2+ 2+ 4+ 4+  Ankle inversion      Ankle eversion      (Blank rows = not tested)   PATIENT EDUCATION: Education details: edu on exam findings and progress towards goals; edu on benefits of ST Person educated: Patient Education method: Explanation, Demonstration, Tactile cues, and Verbal cues Education comprehension: verbalized understanding     HOME EXERCISE PROGRAM Last updated: 03/06/24 Access Code: [email or web address] URL: [email or web address] Date: 03/06/2024 Prepared by: [facility] - Outpatient  Rehab - [facility]  Exercises - Sit to Stand with Counter Support  - 1 x daily - 5 x weekly - 2-3 sets - 5 reps - Heel Raises with Counter Support  - 1 x daily - 7 x weekly - 3 sets - 10  reps - Side Stepping with Counter Support  - 1 x daily - 7 x weekly - 3 sets - 1-2 min rounds hold - Feet Together Balance at [employer] Eyes Closed  - 1 x daily - 7 x weekly - 3 sets - 30 sec hold - Semi-Tandem Balance at [employer] Eyes Open  - 1 x daily - 7 x weekly - 3 sets - 30 sec hold - Backward Walking with Counter Support  - 1 x daily - 7 x weekly - 3 sets - 60 sec hold - Semi-Tandem Balance at [employer] Eyes Closed  - 1 x daily - 7 x weekly - 3 sets - 15-30 sec hold - Corner Balance Feet Together: Eyes Closed With Head Turns  - 1 x daily - 7 x weekly - 3 sets - 3 reps - Seated Bilateral Elbow Flexion with Resistance on Swiss Ball  - 1 x daily - 3 x weekly - 2 sets - 10 reps - Seated Elbow Extension with Self-Anchored Resistance  - 1 x daily - 3 x weekly - 2 sets - 10 reps - Standing Shoulder Row with Anchored Resistance  - 1 x daily - 3 x weekly - 2 sets - 10 reps     Note: Objective measures were completed at Evaluation unless otherwise noted.  Vitals: 98% spO2, 64bpm; 134/64  mmHg  DIAGNOSTIC FINDINGS: none recent  COGNITION: Overall cognitive status: Within functional limits for tasks assessed   SENSATION: Reports reduced sensation in B feet  POSTURE: rounded shoulders, forward head, and increased thoracic kyphosis  LOWER EXTREMITY MMT:    MMT (in sitting) Right Eval Left Eval  Hip flexion 3 3+  Hip extension    Hip abduction 3+ 3+  Hip adduction 3+ 3+  Hip internal rotation    Hip external rotation    Knee flexion 3+ 4-  Knee extension 3+ 4-  Ankle dorsiflexion 3 3  Ankle plantarflexion 2+ 2+  Ankle inversion    Ankle eversion    (Blank rows = not tested)  GAIT: Gait pattern: Very slow, short B step length, occasionally tripping on cane Assistive device utilized: Single point cane Level of assistance: CGA   FUNCTIONAL TESTS:  5 times sit to stand: 47.01 sec using B UEs  94% spO2 10 meter walk test: 43.89 sec with SPC 0.75 ft/sec                                                                                                                                TREATMENT DATE: 01/31/24      GOALS: Goals reviewed with patient? Yes  SHORT TERM GOALS: Target date: 02/21/2024  Patient to be independent with initial HEP. Baseline: HEP initiated; good recall Goal status: MET    LONG TERM GOALS: Target date: 05/23/2024  Patient to be independent with advanced HEP. Baseline: Not yet initiated ; met for current 03/08/24 Goal status: IN PROGRESS 03/08/24  Patient to demonstrate B LE strength >/=4/5.  Baseline: See above; NT 03/08/24; MET 04/04/24 Goal status: MET 04/04/24  Patient to demonstrate gait speed of at least 1.5 ft/sec in order to improve access to community.  Baseline: 0.75 ft/sec; 1.4 ft/sec 03/08/24; 16.64 sec with cane(1.97 ft/sec) , 18.64 sec without cane (1.79 ft/sec) 04/04/24 Goal status: MET 04/04/24  Patient to demonstrate 5xSTS test in <25 sec in order to decrease risk of falls.  Baseline: 47.01 sec using B UEs; 19.29 sec with B UEs 03/08/24 Goal status: MET 03/08/24  Patient to demonstrate 5xSTS test in <25 sec without UEs in order to decrease risk of falls.  Baseline: 19.29 sec with B UEs 03/08/24 Goal status: INITIAL 04/04/24  Patient to score at least 46/56 on Berg in order to decrease risk of falls.  Baseline: 41/56; 44/56 03/08/24; 49/56 04/04/24 Goal status: MET 04/04/24  Patient to report plans to participate in home or community exercise program to maintain fitness.  Baseline: not currently participating; unchanged 03/08/24; pt is looking into [facility] 04/04/24  Goal status: IN PROGRESS 04/04/24   Patient to report 50% improvement in ability to open car door.  Baseline: reports difficulty d/t weakness 03/08/24; reports remaining difficulty 04/04/24 Goal status: IN PROGRESS 04/04/24    ASSESSMENT:  CLINICAL IMPRESSION: Reports ongoing issue of limited energy and quick onset of fatigue w/  exertion.  Initiated with seated  LE PRE to improve muscular endurance and monitor vitals throughout.  Minimal increase to HR observed with sitting and standing resistance activities and requiring seated rest between sets of standing exercise.  Continued sessions to progress POC to improve activity tolerance and balance   OBJECTIVE IMPAIRMENTS: Abnormal gait, decreased activity tolerance, decreased balance, decreased endurance, decreased knowledge of use of DME, decreased strength, decreased safety awareness, and postural dysfunction.   ACTIVITY LIMITATIONS: carrying, lifting, bending, standing, squatting, stairs, transfers, bathing, toileting, dressing, reach over head, hygiene/grooming, and locomotion level  PARTICIPATION LIMITATIONS: meal prep, cleaning, laundry, driving, shopping, community activity, and church  PERSONAL FACTORS: Age, Fitness, Past/current experiences, Time since onset of injury/illness/exacerbation, and 3+ comorbidities: Breast CA s/p B mastectomy, CAD, cataract, depression, GERD, HLD, HTN, osteoporosis, CABG 2007, femoral-popliteal bypass graft 2024 are also affecting patient's functional outcome.   REHAB POTENTIAL: Good  CLINICAL DECISION MAKING: Evolving/moderate complexity  EVALUATION COMPLEXITY: Moderate  PLAN:  PT FREQUENCY: 2x/week  PT DURATION: 4 weeks  PLANNED INTERVENTIONS: [id number]- PT Re-evaluation, 97110-Therapeutic exercises, 97530- Therapeutic activity, 97112- Neuromuscular re-education, 97535- Self Care, [id number]- Manual therapy, [id number]- Gait training, [phone]- Canalith repositioning, Patient/Family education, Balance training, Stair training, Taping, Dry Needling, Joint mobilization, Spinal mobilization, Vestibular training, Cryotherapy, and Moist heat  PLAN FOR NEXT SESSION: work towards STS without UEs, overhead lifting and simulating opening car door for UE strength, dynamic balance   5:03 PM, 04/18/24 M. Kelly Nashiya Disbrow, PT, DPT Physical Therapist- [place] Office Number: [phone]

## 2024-04-24 ENCOUNTER — Ambulatory Visit

## 2024-04-24 DIAGNOSIS — R29898 Other symptoms and signs involving the musculoskeletal system: Secondary | ICD-10-CM | POA: Diagnosis not present

## 2024-04-24 DIAGNOSIS — M6281 Muscle weakness (generalized): Secondary | ICD-10-CM | POA: Diagnosis not present

## 2024-04-24 DIAGNOSIS — R2681 Unsteadiness on feet: Secondary | ICD-10-CM

## 2024-04-24 DIAGNOSIS — R262 Difficulty in walking, not elsewhere classified: Secondary | ICD-10-CM

## 2024-04-24 NOTE — Therapy (Signed)
 OUTPATIENT PHYSICAL THERAPY NEURO TREATMENT   Patient Name: [name] MRN: [id number] DOB:03-29-1941, 83 y.o., female Today's Date: 04/24/2024   PCP: Colene Dauphin, MD  REFERRING PROVIDER: Colene Dauphin, MD     END OF SESSION:  PT End of Session - 04/24/24 1618     Visit Number 17    Number of Visits 21    Date for PT Re-Evaluation 05/23/24    Authorization Type Medicare/BCBS    Authorization - Number of Visits 60    Progress Note Due on Visit 25    PT Start Time 1617    PT Stop Time 1700    PT Time Calculation (min) 43 min    Equipment Utilized During Treatment Gait belt    Activity Tolerance Patient tolerated treatment well    Behavior During Therapy WFL for tasks assessed/performed                     Past Medical History:  Diagnosis Date   Allergy     SEASONAL   Anxiety    BREAST CANCER 1991   left, 1991; B mastectomy   CAD (coronary artery disease)    s/p CABG x5 in 2007   Cataract    BILATERAL-REMOVED   Depression    DIVERTICULOSIS    GERD    History of DVT (deep vein thrombosis)    HYPERLIPIDEMIA    Hypertension    HYPOTHYROIDISM    Occlusion and stenosis of carotid artery without mention of cerebral infarction    OSTEOPOROSIS    S/P TAVR (transcatheter aortic valve replacement) 06/21/2023   s/p TAVR with a 23mm Edwards S3UR via the TF approach by Dr. Abel Hoe & Dr. Honey Lusty   Severe aortic stenosis    Past Surgical History:  Procedure Laterality Date   APPENDECTOMY     COLONOSCOPY     CORONARY ARTERY BYPASS GRAFT     5        2007   ENDARTERECTOMY FEMORAL Right 06/22/2023   Procedure: RIGHT FEMORAL ENDARTERECTOMY WITH PATCH ANGIOPLASTY;  Surgeon: Margherita Shell, MD;  Location: [facility] OR;  Service: Vascular;  Laterality: Right;   FEMORAL-POPLITEAL BYPASS GRAFT Right 06/22/2023   Procedure: FEMORAL THROMBECTOMY;  Surgeon: Margherita Shell, MD;  Location: [facility] OR;  Service: Vascular;  Laterality: Right;   INTRAOPERATIVE TRANSTHORACIC  ECHOCARDIOGRAM N/A 06/21/2023   Procedure: INTRAOPERATIVE TRANSTHORACIC ECHOCARDIOGRAM;  Surgeon: Odie Benne, MD;  Location: [facility];  Service: Open Heart Surgery;  Laterality: N/A;   MASTECTOMY     left with reconstruction and lymph node excision  1992   MASTECTOMY     right with reconstruction   PATCH ANGIOPLASTY Right 06/22/2023   Procedure: PATCH ANGIOPLASTY OF RIGHT FEMORAL ARTERY USING BOVINE PERICARDIAL PATCH;  Surgeon: Margherita Shell, MD;  Location: [facility] OR;  Service: Vascular;  Laterality: Right;   REVISION RECONSTRUCTED BREAST Left 02/2014   willard (plastics in [place])   right ankle arthroscopy     RIGHT/LEFT HEART CATH AND CORONARY ANGIOGRAPHY N/A 06/09/2023   Procedure: RIGHT/LEFT HEART CATH AND CORONARY ANGIOGRAPHY;  Surgeon: Odie Benne, MD;  Location: [facility];  Service: Cardiovascular;  Laterality: N/A;   TEAR DUCT CANAL     TONSILLECTOMY AND ADENOIDECTOMY     TRANSCATHETER AORTIC VALVE REPLACEMENT, TRANSFEMORAL N/A 06/21/2023   Procedure: Transcatheter Aortic Valve Replacement, Transfemoral;  Surgeon: Odie Benne, MD;  Location: [facility];  Service: Open Heart Surgery;  Laterality:  N/A;   UPPER GASTROINTESTINAL ENDOSCOPY     VAGINAL HYSTERECTOMY     ovaries not excised   Patient Active Problem List   Diagnosis Date Noted   Weakness of voice 03/16/2024   Bradykinesia 02/17/2024   Physical deconditioning 01/25/2024   Abnormal ultrasound 07/27/2023   Lung nodule seen on imaging study 07/27/2023   Fatigue 07/27/2023   Constipation 06/26/2023   S/P TAVR (transcatheter aortic valve replacement) 06/21/2023   CAD (coronary artery disease)    History of CVA (cerebrovascular accident) 10/27/2022   Bilateral sensorineural hearing loss 09/22/2022   Chronic diastolic heart failure (HCC) 08/04/2022   Prediabetes 08/04/2022   Acute left-sided low back pain without sciatica 07/30/2020   Cavus deformity of foot 05/01/2020    Poor balance 05/10/2018   Left ventricular diastolic dysfunction 07/27/2017   Essential hypertension 07/27/2017   Fall 03/02/2017   Urinary incontinence 02/16/2017   Chronic venous insufficiency 08/20/2016   Xiphoid pain 03/05/2016   Insomnia 01/16/2016   Anxiety and depression 12/24/2015   Asthma, mild intermittent, well-controlled 11/24/2015   Carotid artery disease (HCC) 06/29/2011   Hypothyroidism 11/11/2010   Hyperlipidemia 11/11/2010   Senile osteoporosis 11/11/2010   RHINOSINUSITIS, RECURRENT 10/29/2008   Personal history of malignant neoplasm of breast 03/27/2008   GERD (gastroesophageal reflux disease) 03/27/2008   DIVERTICULOSIS 03/27/2008   Atherosclerosis of native coronary artery of native heart with angina pectoris (HCC) 08/23/2007   Severe aortic stenosis 08/23/2007   DVT 08/23/2007    ONSET DATE: August 2024  REFERRING DIAG: R26.89 (ICD-10-CM) - Poor balance R53.81 (ICD-10-CM) - Physical deconditioning  THERAPY DIAG:  Muscle weakness (generalized)  Difficulty in walking, not elsewhere classified  Unsteadiness on feet  Other symptoms and signs involving the musculoskeletal system  Rationale for Evaluation and Treatment: Rehabilitation  SUBJECTIVE:                                                                                                                                                                                             SUBJECTIVE STATEMENT: Doing ok, no new issues  Pt accompanied by: self  PERTINENT HISTORY: Breast CA s/p B mastectomy, CAD, cataract, depression, GERD, HLD, HTN, osteoporosis, CABG 2007, femoral-popliteal bypass graft 2024  PAIN:  Are you having pain? Yes: NPRS scale: "no" Pain location: B knee Pain description: achey Aggravating factors: stairs Relieving factors: mobic     PRECAUTIONS: Fall and Other: osteoporosis  RED FLAGS: None   WEIGHT BEARING RESTRICTIONS: No  FALLS: Has patient fallen in last 6 months?  No  LIVING ENVIRONMENT: Lives with: lives alone Lives in: Other townhome Stairs: 7 steps to enter with  railing Has following equipment at home: Single point cane, Walker - 2 wheeled, and Grab bars  PLOF: Independent; monthly cleaning service  PATIENT GOALS: work on endurance and improve balance   OBJECTIVE:   TODAY'S TREATMENT: 04/24/24 Activity Comments  159/76 mmHg, 54 bpm   LE PRE 3x15 -LAQ, 4# -hip add iso -hamstring curls red band -clamshells red band   Forwards/backwards x 2 min In // bars carrying 4# dumbell  Static multisensory balance Difficulty with postural perturbations                LOWER EXTREMITY MMT:    MMT (in sitting) Right Eval Left Eval R 04/04/24 L 04/04/24  Hip flexion 3 3+ 4+ 4  Hip extension      Hip abduction 3+ 3+ 4+ 4+  Hip adduction 3+ 3+ 4+ 4+  Hip internal rotation      Hip external rotation      Knee flexion 3+ 4- 4+ 5  Knee extension 3+ 4- 4+ 5  Ankle dorsiflexion 3 3 4+ 4+  Ankle plantarflexion 2+ 2+ 4+ 4+  Ankle inversion      Ankle eversion      (Blank rows = not tested)   PATIENT EDUCATION: Education details: edu on exam findings and progress towards goals; edu on benefits of ST Person educated: Patient Education method: Explanation, Demonstration, Tactile cues, and Verbal cues Education comprehension: verbalized understanding     HOME EXERCISE PROGRAM Last updated: 03/06/24 Access Code: [email or web address] URL: [email or web address] Date: 03/06/2024 Prepared by: [facility] - Outpatient  Rehab - [facility]  Exercises - Sit to Stand with Counter Support  - 1 x daily - 5 x weekly - 2-3 sets - 5 reps - Heel Raises with Counter Support  - 1 x daily - 7 x weekly - 3 sets - 10 reps - Side Stepping with Counter Support  - 1 x daily - 7 x weekly - 3 sets - 1-2 min rounds hold - Feet Together Balance at [employer] Eyes Closed  - 1 x daily - 7 x weekly - 3 sets - 30 sec hold - Semi-Tandem Balance at [employer] Eyes Open  - 1 x daily - 7 x weekly - 3 sets - 30 sec hold - Backward Walking with Counter Support  - 1 x daily - 7 x weekly - 3 sets - 60 sec hold - Semi-Tandem Balance at [employer] Eyes Closed  - 1 x daily - 7 x weekly - 3 sets - 15-30 sec hold - Corner Balance Feet Together: Eyes Closed With Head Turns  - 1 x daily - 7 x weekly - 3 sets - 3 reps - Seated Bilateral Elbow Flexion with Resistance on Swiss Ball  - 1 x daily - 3 x weekly - 2 sets - 10 reps - Seated Elbow Extension with Self-Anchored Resistance  - 1 x daily - 3 x weekly - 2 sets - 10 reps - Standing Shoulder Row with Anchored Resistance  - 1 x daily - 3 x weekly - 2 sets - 10 reps     Note: Objective measures were completed at Evaluation unless otherwise noted.  Vitals: 98% spO2, 64bpm; 134/64 mmHg  DIAGNOSTIC FINDINGS: none recent  COGNITION: Overall cognitive status: Within functional limits for tasks assessed   SENSATION: Reports reduced sensation in B feet  POSTURE: rounded shoulders, forward head, and increased thoracic kyphosis  LOWER EXTREMITY MMT:    MMT (in sitting) Right Eval Left Eval  Hip  flexion 3 3+  Hip extension    Hip abduction 3+ 3+  Hip adduction 3+ 3+  Hip internal rotation    Hip external rotation    Knee flexion 3+ 4-  Knee extension 3+ 4-  Ankle dorsiflexion 3 3  Ankle plantarflexion 2+ 2+  Ankle inversion    Ankle eversion    (Blank rows = not tested)  GAIT: Gait pattern: Very slow, short B step length, occasionally tripping on cane Assistive device utilized: Single point cane Level of assistance: CGA   FUNCTIONAL TESTS:  5 times sit to stand: 47.01 sec using B UEs  94% spO2 10 meter walk test: 43.89 sec with SPC 0.75 ft/sec                                                                                                                               TREATMENT DATE: 01/31/24      GOALS: Goals reviewed with patient? Yes  SHORT TERM GOALS: Target date:  02/21/2024  Patient to be independent with initial HEP. Baseline: HEP initiated; good recall Goal status: MET    LONG TERM GOALS: Target date: 05/23/2024  Patient to be independent with advanced HEP. Baseline: Not yet initiated ; met for current 03/08/24 Goal status: IN PROGRESS 03/08/24  Patient to demonstrate B LE strength >/=4/5.  Baseline: See above; NT 03/08/24; MET 04/04/24 Goal status: MET 04/04/24  Patient to demonstrate gait speed of at least 1.5 ft/sec in order to improve access to community.  Baseline: 0.75 ft/sec; 1.4 ft/sec 03/08/24; 16.64 sec with cane(1.97 ft/sec) , 18.64 sec without cane (1.79 ft/sec) 04/04/24 Goal status: MET 04/04/24  Patient to demonstrate 5xSTS test in <25 sec in order to decrease risk of falls.  Baseline: 47.01 sec using B UEs; 19.29 sec with B UEs 03/08/24 Goal status: MET 03/08/24  Patient to demonstrate 5xSTS test in <25 sec without UEs in order to decrease risk of falls.  Baseline: 19.29 sec with B UEs 03/08/24 Goal status: INITIAL 04/04/24  Patient to score at least 46/56 on Berg in order to decrease risk of falls.  Baseline: 41/56; 44/56 03/08/24; 49/56 04/04/24 Goal status: MET 04/04/24  Patient to report plans to participate in home or community exercise program to maintain fitness.  Baseline: not currently participating; unchanged 03/08/24; pt is looking into [facility] 04/04/24  Goal status: IN PROGRESS 04/04/24   Patient to report 50% improvement in ability to open car door.  Baseline: reports difficulty d/t weakness 03/08/24; reports remaining difficulty 04/04/24 Goal status: IN PROGRESS 04/04/24    ASSESSMENT:  CLINICAL IMPRESSION: Progressed LE PRE for increased weight and repetition to improve endurance and functional activity tolerance. Dynamic and static balance activities with emphasis on single limb support and techniques to facilitate anticipatory and reactive balance strategies.  Minimal effect of righting response to posterior LOB requiring  physical support to correct. Continued sessions to progress POC details to improve strength, mobility, balance, and activity tolerance.  OBJECTIVE IMPAIRMENTS: Abnormal gait, decreased activity tolerance, decreased balance, decreased endurance, decreased knowledge of use of DME, decreased strength, decreased safety awareness, and postural dysfunction.   ACTIVITY LIMITATIONS: carrying, lifting, bending, standing, squatting, stairs, transfers, bathing, toileting, dressing, reach over head, hygiene/grooming, and locomotion level  PARTICIPATION LIMITATIONS: meal prep, cleaning, laundry, driving, shopping, community activity, and church  PERSONAL FACTORS: Age, Fitness, Past/current experiences, Time since onset of injury/illness/exacerbation, and 3+ comorbidities: Breast CA s/p B mastectomy, CAD, cataract, depression, GERD, HLD, HTN, osteoporosis, CABG 2007, femoral-popliteal bypass graft 2024 are also affecting patient's functional outcome.   REHAB POTENTIAL: Good  CLINICAL DECISION MAKING: Evolving/moderate complexity  EVALUATION COMPLEXITY: Moderate  PLAN:  PT FREQUENCY: 2x/week  PT DURATION: 4 weeks  PLANNED INTERVENTIONS: 97164- PT Re-evaluation, 97110-Therapeutic exercises, 97530- Therapeutic activity, 97112- Neuromuscular re-education, 97535- Self Care, [id number]- Manual therapy, [phone]- Gait training, [phone]- Canalith repositioning, Patient/Family education, Balance training, Stair training, Taping, Dry Needling, Joint mobilization, Spinal mobilization, Vestibular training, Cryotherapy, and Moist heat  PLAN FOR NEXT SESSION: work towards STS without UEs, overhead lifting and simulating opening car door for UE strength, dynamic balance   4:19 PM, 04/24/24 M. Kelly [place], PT, DPT Physical Therapist- [place] Office Number: [phone]

## 2024-04-26 ENCOUNTER — Telehealth: Payer: Self-pay | Admitting: Internal Medicine

## 2024-04-26 NOTE — Telephone Encounter (Signed)
 Copied from CRM [id number]. Topic: General - Call Back - No Documentation >> Apr 26, 2024  1:45 PM Leah C wrote: Reason for CRM:  Patient's had visit with heart surgeon and they are suggesting that patient see's a speech therapist because she lost her voice. Patient would like Dr. Donnette Gal feedback/thoughts on whether she should see a speech therapist or ENT. Patients contact is [phone].

## 2024-04-27 NOTE — Telephone Encounter (Signed)
 She has already been referred to speech therapy.

## 2024-04-27 NOTE — Telephone Encounter (Signed)
 Spoke with patient today.

## 2024-04-30 ENCOUNTER — Other Ambulatory Visit: Payer: Self-pay | Admitting: Internal Medicine

## 2024-04-30 NOTE — Therapy (Signed)
 OUTPATIENT PHYSICAL THERAPY NEURO TREATMENT   Patient Name: [name] MRN: [id number] DOB:04-10-1941, 83 y.o., female Today's Date: 05/01/2024   PCP: Geofm Glade JINNY, MD  REFERRING PROVIDER: Geofm Glade JINNY, MD     END OF SESSION:  PT End of Session - 05/01/24 1646     Visit Number 18    Number of Visits 21    Date for PT Re-Evaluation 05/23/24    Authorization Type Medicare/BCBS    Authorization - Number of Visits 60    Progress Note Due on Visit 25    PT Start Time 1620    PT Stop Time 1701    PT Time Calculation (min) 41 min    Equipment Utilized During Treatment Gait belt    Activity Tolerance Patient tolerated treatment well    Behavior During Therapy WFL for tasks assessed/performed                   Past Medical History:  Diagnosis Date   Allergy     SEASONAL   Anxiety    BREAST CANCER 1991   left, 1991; B mastectomy   CAD (coronary artery disease)    s/p CABG x5 in 2007   Cataract    BILATERAL-REMOVED   Depression    DIVERTICULOSIS    GERD    History of DVT (deep vein thrombosis)    HYPERLIPIDEMIA    Hypertension    HYPOTHYROIDISM    Occlusion and stenosis of carotid artery without mention of cerebral infarction    OSTEOPOROSIS    S/P TAVR (transcatheter aortic valve replacement) 06/21/2023   s/p TAVR with a 23mm Edwards S3UR via the TF approach by Dr. Verlin & Dr. Maryjane   Severe aortic stenosis    Past Surgical History:  Procedure Laterality Date   APPENDECTOMY     COLONOSCOPY     CORONARY ARTERY BYPASS GRAFT     5        2007   ENDARTERECTOMY FEMORAL Right 06/22/2023   Procedure: RIGHT FEMORAL ENDARTERECTOMY WITH PATCH ANGIOPLASTY;  Surgeon: Serene Gaile ORN, MD;  Location: [facility] OR;  Service: Vascular;  Laterality: Right;   FEMORAL-POPLITEAL BYPASS GRAFT Right 06/22/2023   Procedure: FEMORAL THROMBECTOMY;  Surgeon: Serene Gaile ORN, MD;  Location: [facility] OR;  Service: Vascular;  Laterality: Right;   INTRAOPERATIVE TRANSTHORACIC  ECHOCARDIOGRAM N/A 06/21/2023   Procedure: INTRAOPERATIVE TRANSTHORACIC ECHOCARDIOGRAM;  Surgeon: Verlin Lonni BIRCH, MD;  Location: [facility];  Service: Open Heart Surgery;  Laterality: N/A;   MASTECTOMY     left with reconstruction and lymph node excision  1992   MASTECTOMY     right with reconstruction   PATCH ANGIOPLASTY Right 06/22/2023   Procedure: PATCH ANGIOPLASTY OF RIGHT FEMORAL ARTERY USING BOVINE PERICARDIAL PATCH;  Surgeon: Serene Gaile ORN, MD;  Location: [facility];  Service: Vascular;  Laterality: Right;   REVISION RECONSTRUCTED BREAST Left 02/2014   willard (plastics in [place])   right ankle arthroscopy     RIGHT/LEFT HEART CATH AND CORONARY ANGIOGRAPHY N/A 06/09/2023   Procedure: RIGHT/LEFT HEART CATH AND CORONARY ANGIOGRAPHY;  Surgeon: Verlin Lonni BIRCH, MD;  Location: [facility];  Service: Cardiovascular;  Laterality: N/A;   TEAR DUCT CANAL     TONSILLECTOMY AND ADENOIDECTOMY     TRANSCATHETER AORTIC VALVE REPLACEMENT, TRANSFEMORAL N/A 06/21/2023   Procedure: Transcatheter Aortic Valve Replacement, Transfemoral;  Surgeon: Verlin Lonni BIRCH, MD;  Location: [facility];  Service: Open Heart Surgery;  Laterality: N/A;  UPPER GASTROINTESTINAL ENDOSCOPY     VAGINAL HYSTERECTOMY     ovaries not excised   Patient Active Problem List   Diagnosis Date Noted   Weakness of voice 03/16/2024   Bradykinesia 02/17/2024   Physical deconditioning 01/25/2024   Abnormal ultrasound 07/27/2023   Lung nodule seen on imaging study 07/27/2023   Fatigue 07/27/2023   Constipation 06/26/2023   S/P TAVR (transcatheter aortic valve replacement) 06/21/2023   CAD (coronary artery disease)    History of CVA (cerebrovascular accident) 10/27/2022   Bilateral sensorineural hearing loss 09/22/2022   Chronic diastolic heart failure (HCC) 08/04/2022   Prediabetes 08/04/2022   Acute left-sided low back pain without sciatica 07/30/2020   Cavus deformity of foot 05/01/2020    Poor balance 05/10/2018   Left ventricular diastolic dysfunction 07/27/2017   Essential hypertension 07/27/2017   Fall 03/02/2017   Urinary incontinence 02/16/2017   Chronic venous insufficiency 08/20/2016   Xiphoid pain 03/05/2016   Insomnia 01/16/2016   Anxiety and depression 12/24/2015   Asthma, mild intermittent, well-controlled 11/24/2015   Carotid artery disease (HCC) 06/29/2011   Hypothyroidism 11/11/2010   Hyperlipidemia 11/11/2010   Senile osteoporosis 11/11/2010   RHINOSINUSITIS, RECURRENT 10/29/2008   Personal history of malignant neoplasm of breast 03/27/2008   GERD (gastroesophageal reflux disease) 03/27/2008   DIVERTICULOSIS 03/27/2008   Atherosclerosis of native coronary artery of native heart with angina pectoris (HCC) 08/23/2007   Severe aortic stenosis 08/23/2007   DVT 08/23/2007    ONSET DATE: August 2024  REFERRING DIAG: R26.89 (ICD-10-CM) - Poor balance R53.81 (ICD-10-CM) - Physical deconditioning  THERAPY DIAG:  Muscle weakness (generalized)  Difficulty in walking, not elsewhere classified  Unsteadiness on feet  Other symptoms and signs involving the musculoskeletal system  Rationale for Evaluation and Treatment: Rehabilitation  SUBJECTIVE:                                                                                                                                                                                             SUBJECTIVE STATEMENT: Not been up to much.   Pt accompanied by: self  PERTINENT HISTORY: Breast CA s/p B mastectomy, CAD, cataract, depression, GERD, HLD, HTN, osteoporosis, CABG 2007, femoral-popliteal bypass graft 2024  PAIN:  Are you having pain? Yes: NPRS scale: no Pain location: B knee Pain description: achey Aggravating factors: stairs Relieving factors: mobic     PRECAUTIONS: Fall and Other: osteoporosis  RED FLAGS: None   WEIGHT BEARING RESTRICTIONS: No  FALLS: Has patient fallen in last 6 months?  No  LIVING ENVIRONMENT: Lives with: lives alone Lives in: Other townhome Stairs: 7 steps to enter with railing Has  following equipment at home: Single point cane, Walker - 2 wheeled, and Grab bars  PLOF: Independent; monthly cleaning service  PATIENT GOALS: work on endurance and improve balance   OBJECTIVE:     TODAY'S TREATMENT: 05/01/24 Activity Comments  Vitals at start of session 150/55mmHg, 55 bpm   romberg EO/EC 30 each  Cues for core and glute engagement   alt toe tap on step  Cueing to improve awareness of safe foot placement, maintain amplitude of movement to avoid toes catching   standing heel/toe raises 2x10   fwd/back stepping  Weaned UE support; mild instability   backwards walking along counter 1 UE support   walking EC along counter 1 UE support     PATIENT EDUCATION: Education details: discussed transition to fitness regimen upon DC Person educated: Patient Education method: Explanation Education comprehension: verbalized understanding   HOME EXERCISE PROGRAM Last updated: 03/06/24 Access Code: [email or web address] URL: [email or web address] Date: 03/06/2024 Prepared by: [facility] - Outpatient  Rehab - [facility]  Exercises - Sit to Stand with Counter Support  - 1 x daily - 5 x weekly - 2-3 sets - 5 reps - Heel Raises with Counter Support  - 1 x daily - 7 x weekly - 3 sets - 10 reps - Side Stepping with Counter Support  - 1 x daily - 7 x weekly - 3 sets - 1-2 min rounds hold - Feet Together Balance at [employer] Eyes Closed  - 1 x daily - 7 x weekly - 3 sets - 30 sec hold - Semi-Tandem Balance at [employer] Eyes Open  - 1 x daily - 7 x weekly - 3 sets - 30 sec hold - Backward Walking with Counter Support  - 1 x daily - 7 x weekly - 3 sets - 60 sec hold - Semi-Tandem Balance at [employer] Eyes Closed  - 1 x daily - 7 x weekly - 3 sets - 15-30 sec hold - Corner Balance Feet Together: Eyes Closed With Head Turns  - 1 x daily - 7 x weekly - 3  sets - 3 reps - Seated Bilateral Elbow Flexion with Resistance on Swiss Ball  - 1 x daily - 3 x weekly - 2 sets - 10 reps - Seated Elbow Extension with Self-Anchored Resistance  - 1 x daily - 3 x weekly - 2 sets - 10 reps - Standing Shoulder Row with Anchored Resistance  - 1 x daily - 3 x weekly - 2 sets - 10 reps     Note: Objective measures were completed at Evaluation unless otherwise noted.  Vitals: 98% spO2, 64bpm; 134/64 mmHg  DIAGNOSTIC FINDINGS: none recent  COGNITION: Overall cognitive status: Within functional limits for tasks assessed   SENSATION: Reports reduced sensation in B feet  POSTURE: rounded shoulders, forward head, and increased thoracic kyphosis  LOWER EXTREMITY MMT:    MMT (in sitting) Right Eval Left Eval  Hip flexion 3 3+  Hip extension    Hip abduction 3+ 3+  Hip adduction 3+ 3+  Hip internal rotation    Hip external rotation    Knee flexion 3+ 4-  Knee extension 3+ 4-  Ankle dorsiflexion 3 3  Ankle plantarflexion 2+ 2+  Ankle inversion    Ankle eversion    (Blank rows = not tested)  GAIT: Gait pattern: Very slow, short B step length, occasionally tripping on cane Assistive device utilized: Single point cane Level of assistance: CGA   FUNCTIONAL TESTS:  5 times sit  to stand: 47.01 sec using B UEs  94% spO2 10 meter walk test: 43.89 sec with SPC 0.75 ft/sec                                                                                                                               TREATMENT DATE: 01/31/24      GOALS: Goals reviewed with patient? Yes  SHORT TERM GOALS: Target date: 02/21/2024  Patient to be independent with initial HEP. Baseline: HEP initiated; good recall Goal status: MET    LONG TERM GOALS: Target date: 05/23/2024  Patient to be independent with advanced HEP. Baseline: Not yet initiated ; met for current 03/08/24 Goal status: IN PROGRESS 03/08/24  Patient to demonstrate B LE strength >/=4/5.  Baseline:  See above; NT 03/08/24; MET 04/04/24 Goal status: MET 04/04/24  Patient to demonstrate gait speed of at least 1.5 ft/sec in order to improve access to community.  Baseline: 0.75 ft/sec; 1.4 ft/sec 03/08/24; 16.64 sec with cane(1.97 ft/sec) , 18.64 sec without cane (1.79 ft/sec) 04/04/24 Goal status: MET 04/04/24  Patient to demonstrate 5xSTS test in <25 sec in order to decrease risk of falls.  Baseline: 47.01 sec using B UEs; 19.29 sec with B UEs 03/08/24 Goal status: MET 03/08/24  Patient to demonstrate 5xSTS test in <25 sec without UEs in order to decrease risk of falls.  Baseline: 19.29 sec with B UEs 03/08/24 Goal status: INITIAL 04/04/24  Patient to score at least 46/56 on Berg in order to decrease risk of falls.  Baseline: 41/56; 44/56 03/08/24; 49/56 04/04/24 Goal status: MET 04/04/24  Patient to report plans to participate in home or community exercise program to maintain fitness.  Baseline: not currently participating; unchanged 03/08/24; pt is looking into ACT 04/04/24  Goal status: IN PROGRESS 04/04/24   Patient to report 50% improvement in ability to open car door.  Baseline: reports difficulty d/t weakness 03/08/24; reports remaining difficulty 04/04/24 Goal status: IN PROGRESS 04/04/24    ASSESSMENT:  CLINICAL IMPRESSION: Patient arrived to session without new complaints. Performed static and dynamic balance challenges including SLS, EC, and stepping strategy. Patient required cueing for improved awareness of foot position and maintaining amplitude of movement. Patient's standing tolerance has improved significantly and she did not require sitting rest break despite prolonged standing tasks today. No complaints upon leaving.    OBJECTIVE IMPAIRMENTS: Abnormal gait, decreased activity tolerance, decreased balance, decreased endurance, decreased knowledge of use of DME, decreased strength, decreased safety awareness, and postural dysfunction.   ACTIVITY LIMITATIONS: carrying, lifting,  bending, standing, squatting, stairs, transfers, bathing, toileting, dressing, reach over head, hygiene/grooming, and locomotion level  PARTICIPATION LIMITATIONS: meal prep, cleaning, laundry, driving, shopping, community activity, and church  PERSONAL FACTORS: Age, Fitness, Past/current experiences, Time since onset of injury/illness/exacerbation, and 3+ comorbidities: Breast CA s/p B mastectomy, CAD, cataract, depression, GERD, HLD, HTN, osteoporosis, CABG 2007, femoral-popliteal bypass graft 2024 are also affecting patient's functional outcome.   REHAB POTENTIAL:  Good  CLINICAL DECISION MAKING: Evolving/moderate complexity  EVALUATION COMPLEXITY: Moderate  PLAN:  PT FREQUENCY: 2x/week  PT DURATION: 4 weeks  PLANNED INTERVENTIONS: [id number]- PT Re-evaluation, 97110-Therapeutic exercises, 97530- Therapeutic activity, 97112- Neuromuscular re-education, 97535- Self Care, [id number]- Manual therapy, [phone]- Gait training, [phone]- Canalith repositioning, Patient/Family education, Balance training, Stair training, Taping, Dry Needling, Joint mobilization, Spinal mobilization, Vestibular training, Cryotherapy, and Moist heat  PLAN FOR NEXT SESSION: discuss fitness program upon DC; work towards STS without UEs, overhead lifting and simulating opening car door for UE strength, dynamic balance   Louana Terrilyn Christians, PT, DPT 05/01/24 5:04 PM  [facility] at [facility] [address], [address] [place], [place] [address] Phone # [phone] Fax # [phone]

## 2024-05-01 ENCOUNTER — Encounter: Payer: Self-pay | Admitting: Physical Therapy

## 2024-05-01 ENCOUNTER — Ambulatory Visit: Admitting: Physical Therapy

## 2024-05-01 ENCOUNTER — Ambulatory Visit

## 2024-05-01 DIAGNOSIS — M6281 Muscle weakness (generalized): Secondary | ICD-10-CM

## 2024-05-01 DIAGNOSIS — R262 Difficulty in walking, not elsewhere classified: Secondary | ICD-10-CM | POA: Diagnosis not present

## 2024-05-01 DIAGNOSIS — R29898 Other symptoms and signs involving the musculoskeletal system: Secondary | ICD-10-CM | POA: Diagnosis not present

## 2024-05-01 DIAGNOSIS — R2681 Unsteadiness on feet: Secondary | ICD-10-CM

## 2024-05-03 DIAGNOSIS — H04123 Dry eye syndrome of bilateral lacrimal glands: Secondary | ICD-10-CM | POA: Diagnosis not present

## 2024-05-03 DIAGNOSIS — H353131 Nonexudative age-related macular degeneration, bilateral, early dry stage: Secondary | ICD-10-CM | POA: Diagnosis not present

## 2024-05-07 NOTE — Therapy (Signed)
 OUTPATIENT PHYSICAL THERAPY NEURO TREATMENT   Patient Name: [name] MRN: [id number] DOB:[birth year], 83 y.o., female Today's Date: 05/08/2024   PCP: Geofm Glade JINNY, MD  REFERRING PROVIDER: Geofm Glade JINNY, MD     END OF SESSION:  PT End of Session - 05/08/24 1655     Visit Number 19    Number of Visits 21    Date for PT Re-Evaluation 05/23/24    Authorization Type Medicare/BCBS    Authorization - Number of Visits 60    Progress Note Due on Visit 25    PT Start Time 1617    PT Stop Time 1700    PT Time Calculation (min) 43 min    Equipment Utilized During Treatment --    Activity Tolerance Patient tolerated treatment well    Behavior During Therapy WFL for tasks assessed/performed                    Past Medical History:  Diagnosis Date   Allergy     SEASONAL   Anxiety    BREAST CANCER 1991   left, 1991; B mastectomy   CAD (coronary artery disease)    s/p CABG x5 in 2007   Cataract    BILATERAL-REMOVED   Depression    DIVERTICULOSIS    GERD    History of DVT (deep vein thrombosis)    HYPERLIPIDEMIA    Hypertension    HYPOTHYROIDISM    Occlusion and stenosis of carotid artery without mention of cerebral infarction    OSTEOPOROSIS    S/P TAVR (transcatheter aortic valve replacement) 06/21/2023   s/p TAVR with a 23mm Edwards S3UR via the TF approach by Dr. Verlin & Dr. Maryjane   Severe aortic stenosis    Past Surgical History:  Procedure Laterality Date   APPENDECTOMY     COLONOSCOPY     CORONARY ARTERY BYPASS GRAFT     5        2007   ENDARTERECTOMY FEMORAL Right 06/22/2023   Procedure: RIGHT FEMORAL ENDARTERECTOMY WITH PATCH ANGIOPLASTY;  Surgeon: Serene Gaile ORN, MD;  Location: [facility] OR;  Service: Vascular;  Laterality: Right;   FEMORAL-POPLITEAL BYPASS GRAFT Right 06/22/2023   Procedure: FEMORAL THROMBECTOMY;  Surgeon: Serene Gaile ORN, MD;  Location: [facility] OR;  Service: Vascular;  Laterality: Right;   INTRAOPERATIVE TRANSTHORACIC  ECHOCARDIOGRAM N/A 06/21/2023   Procedure: INTRAOPERATIVE TRANSTHORACIC ECHOCARDIOGRAM;  Surgeon: Verlin Lonni BIRCH, MD;  Location: [facility];  Service: Open Heart Surgery;  Laterality: N/A;   MASTECTOMY     left with reconstruction and lymph node excision  1992   MASTECTOMY     right with reconstruction   PATCH ANGIOPLASTY Right 06/22/2023   Procedure: PATCH ANGIOPLASTY OF RIGHT FEMORAL ARTERY USING BOVINE PERICARDIAL PATCH;  Surgeon: Serene Gaile ORN, MD;  Location: [facility];  Service: Vascular;  Laterality: Right;   REVISION RECONSTRUCTED BREAST Left 02/2014   willard (plastics in [place])   right ankle arthroscopy     RIGHT/LEFT HEART CATH AND CORONARY ANGIOGRAPHY N/A 06/09/2023   Procedure: RIGHT/LEFT HEART CATH AND CORONARY ANGIOGRAPHY;  Surgeon: Verlin Lonni BIRCH, MD;  Location: [facility];  Service: Cardiovascular;  Laterality: N/A;   TEAR DUCT CANAL     TONSILLECTOMY AND ADENOIDECTOMY     TRANSCATHETER AORTIC VALVE REPLACEMENT, TRANSFEMORAL N/A 06/21/2023   Procedure: Transcatheter Aortic Valve Replacement, Transfemoral;  Surgeon: Verlin Lonni BIRCH, MD;  Location: [facility];  Service: Open Heart Surgery;  Laterality: N/A;  UPPER GASTROINTESTINAL ENDOSCOPY     VAGINAL HYSTERECTOMY     ovaries not excised   Patient Active Problem List   Diagnosis Date Noted   Weakness of voice 03/16/2024   Bradykinesia 02/17/2024   Physical deconditioning 01/25/2024   Abnormal ultrasound 07/27/2023   Lung nodule seen on imaging study 07/27/2023   Fatigue 07/27/2023   Constipation 06/26/2023   S/P TAVR (transcatheter aortic valve replacement) 06/21/2023   CAD (coronary artery disease)    History of CVA (cerebrovascular accident) 10/27/2022   Bilateral sensorineural hearing loss 09/22/2022   Chronic diastolic heart failure (HCC) 08/04/2022   Prediabetes 08/04/2022   Acute left-sided low back pain without sciatica 07/30/2020   Cavus deformity of foot 05/01/2020    Poor balance 05/10/2018   Left ventricular diastolic dysfunction 07/27/2017   Essential hypertension 07/27/2017   Fall 03/02/2017   Urinary incontinence 02/16/2017   Chronic venous insufficiency 08/20/2016   Xiphoid pain 03/05/2016   Insomnia 01/16/2016   Anxiety and depression 12/24/2015   Asthma, mild intermittent, well-controlled 11/24/2015   Carotid artery disease (HCC) 06/29/2011   Hypothyroidism 11/11/2010   Hyperlipidemia 11/11/2010   Senile osteoporosis 11/11/2010   RHINOSINUSITIS, RECURRENT 10/29/2008   Personal history of malignant neoplasm of breast 03/27/2008   GERD (gastroesophageal reflux disease) 03/27/2008   DIVERTICULOSIS 03/27/2008   Atherosclerosis of native coronary artery of native heart with angina pectoris (HCC) 08/23/2007   Severe aortic stenosis 08/23/2007   DVT 08/23/2007    ONSET DATE: August 2024  REFERRING DIAG: R26.89 (ICD-10-CM) - Poor balance R53.81 (ICD-10-CM) - Physical deconditioning  THERAPY DIAG:  Muscle weakness (generalized)  Difficulty in walking, not elsewhere classified  Unsteadiness on feet  Other symptoms and signs involving the musculoskeletal system  Rationale for Evaluation and Treatment: Rehabilitation  SUBJECTIVE:                                                                                                                                                                                             SUBJECTIVE STATEMENT: Nothing new. Shut in because of the weather.   Pt accompanied by: self  PERTINENT HISTORY: Breast CA s/p B mastectomy, CAD, cataract, depression, GERD, HLD, HTN, osteoporosis, CABG 2007, femoral-popliteal bypass graft 2024  PAIN:  Are you having pain? Yes: NPRS scale: no Pain location: B knee Pain description: achey Aggravating factors: stairs Relieving factors: mobic     PRECAUTIONS: Fall and Other: osteoporosis  RED FLAGS: None   WEIGHT BEARING RESTRICTIONS: No  FALLS: Has patient fallen  in last 6 months? No  LIVING ENVIRONMENT: Lives with: lives alone Lives in: Other townhome Stairs: 7 steps to enter  with railing Has following equipment at home: Single point cane, Walker - 2 wheeled, and Grab bars  PLOF: Independent; monthly cleaning service  PATIENT GOALS: work on endurance and improve balance   OBJECTIVE:      TODAY'S TREATMENT: 05/08/24 Activity Comments  Vitals at start of session 137/67 mmHg, 59bpm   Nustep L5 x 6 min UEs/LEs Cueing for larger amplitude movement rather than increasing pace   Rest break at 3 min d/t fatigue   superset: overhead press with 2# each hand 2x10  sidestepping 2x 1 min Unable to perform press with 3# d/t weakness. Did not use ankle weights with sidestepping d/t bandage on front of R ankle       PATIENT EDUCATION: Education details: discussed [employer] fitness program and ACT as ways to maintain fitness regimen in social environment. Provided several handouts on class schedules, locations, registration, etc Person educated: Patient Education method: Explanation, Demonstration, Tactile cues, Verbal cues, and Handouts Education comprehension: verbalized understanding   HOME EXERCISE PROGRAM Last updated: 03/06/24 Access Code: [email or web address] URL: [email or web address] Date: 03/06/2024 Prepared by: [facility] - Outpatient  Rehab - [facility]  Exercises - Sit to Stand with Counter Support  - 1 x daily - 5 x weekly - 2-3 sets - 5 reps - Heel Raises with Counter Support  - 1 x daily - 7 x weekly - 3 sets - 10 reps - Side Stepping with Counter Support  - 1 x daily - 7 x weekly - 3 sets - 1-2 min rounds hold - Feet Together Balance at [employer] Eyes Closed  - 1 x daily - 7 x weekly - 3 sets - 30 sec hold - Semi-Tandem Balance at [employer] Eyes Open  - 1 x daily - 7 x weekly - 3 sets - 30 sec hold - Backward Walking with Counter Support  - 1 x daily - 7 x weekly - 3 sets - 60 sec hold - Semi-Tandem Balance at [employer] Eyes Closed  - 1 x daily - 7 x weekly - 3 sets - 15-30 sec hold - Corner Balance Feet Together: Eyes Closed With Head Turns  - 1 x daily - 7 x weekly - 3 sets - 3 reps - Seated Bilateral Elbow Flexion with Resistance on Swiss Ball  - 1 x daily - 3 x weekly - 2 sets - 10 reps - Seated Elbow Extension with Self-Anchored Resistance  - 1 x daily - 3 x weekly - 2 sets - 10 reps - Standing Shoulder Row with Anchored Resistance  - 1 x daily - 3 x weekly - 2 sets - 10 reps     Note: Objective measures were completed at Evaluation unless otherwise noted.  Vitals: 98% spO2, 64bpm; 134/64 mmHg  DIAGNOSTIC FINDINGS: none recent  COGNITION: Overall cognitive status: Within functional limits for tasks assessed   SENSATION: Reports reduced sensation in B feet  POSTURE: rounded shoulders, forward head, and increased thoracic kyphosis  LOWER EXTREMITY MMT:    MMT (in sitting) Right Eval Left Eval  Hip flexion 3 3+  Hip extension    Hip abduction 3+ 3+  Hip adduction 3+ 3+  Hip internal rotation    Hip external rotation    Knee flexion 3+ 4-  Knee extension 3+ 4-  Ankle dorsiflexion 3 3  Ankle plantarflexion 2+ 2+  Ankle inversion    Ankle eversion    (Blank rows = not tested)  GAIT: Gait pattern: Very slow, short B step length,  occasionally tripping on cane Assistive device utilized: Single point cane Level of assistance: CGA   FUNCTIONAL TESTS:  5 times sit to stand: 47.01 sec using B UEs  94% spO2 10 meter walk test: 43.89 sec with SPC 0.75 ft/sec                                                                                                                               TREATMENT DATE: 01/31/24      GOALS: Goals reviewed with patient? Yes  SHORT TERM GOALS: Target date: 02/21/2024  Patient to be independent with initial HEP. Baseline: HEP initiated; good recall Goal status: MET    LONG TERM GOALS: Target date: 05/23/2024  Patient to be independent with  advanced HEP. Baseline: Not yet initiated ; met for current 03/08/24 Goal status: IN PROGRESS 03/08/24  Patient to demonstrate B LE strength >/=4/5.  Baseline: See above; NT 03/08/24; MET 04/04/24 Goal status: MET 04/04/24  Patient to demonstrate gait speed of at least 1.5 ft/sec in order to improve access to community.  Baseline: 0.75 ft/sec; 1.4 ft/sec 03/08/24; 16.64 sec with cane(1.97 ft/sec) , 18.64 sec without cane (1.79 ft/sec) 04/04/24 Goal status: MET 04/04/24  Patient to demonstrate 5xSTS test in <25 sec in order to decrease risk of falls.  Baseline: 47.01 sec using B UEs; 19.29 sec with B UEs 03/08/24 Goal status: MET 03/08/24  Patient to demonstrate 5xSTS test in <25 sec without UEs in order to decrease risk of falls.  Baseline: 19.29 sec with B UEs 03/08/24 Goal status: INITIAL 04/04/24  Patient to score at least 46/56 on Berg in order to decrease risk of falls.  Baseline: 41/56; 44/56 03/08/24; 49/56 04/04/24 Goal status: MET 04/04/24  Patient to report plans to participate in home or community exercise program to maintain fitness.  Baseline: not currently participating; unchanged 03/08/24; pt is looking into ACT 04/04/24  Goal status: IN PROGRESS 04/04/24   Patient to report 50% improvement in ability to open car door.  Baseline: reports difficulty d/t weakness 03/08/24; reports remaining difficulty 04/04/24 Goal status: IN PROGRESS 04/04/24    ASSESSMENT:  CLINICAL IMPRESSION: Patient arrived to session without complaints. Continued discussion encouraging pt to join fitness regimen of some sort to maintain fitness gains after DC and provided several resources. Patient requires modifications to strengthening exercises for max tolerance d/t remaining weakness. No complaints at end of session.   OBJECTIVE IMPAIRMENTS: Abnormal gait, decreased activity tolerance, decreased balance, decreased endurance, decreased knowledge of use of DME, decreased strength, decreased safety awareness,  and postural dysfunction.   ACTIVITY LIMITATIONS: carrying, lifting, bending, standing, squatting, stairs, transfers, bathing, toileting, dressing, reach over head, hygiene/grooming, and locomotion level  PARTICIPATION LIMITATIONS: meal prep, cleaning, laundry, driving, shopping, community activity, and church  PERSONAL FACTORS: Age, Fitness, Past/current experiences, Time since onset of injury/illness/exacerbation, and 3+ comorbidities: Breast CA s/p B mastectomy, CAD, cataract, depression, GERD, HLD, HTN, osteoporosis, CABG 2007, femoral-popliteal bypass graft 2024  are also affecting patient's functional outcome.   REHAB POTENTIAL: Good  CLINICAL DECISION MAKING: Evolving/moderate complexity  EVALUATION COMPLEXITY: Moderate  PLAN:  PT FREQUENCY: 2x/week  PT DURATION: 4 weeks  PLANNED INTERVENTIONS: 97164- PT Re-evaluation, 97110-Therapeutic exercises, 97530- Therapeutic activity, 97112- Neuromuscular re-education, 97535- Self Care, [id number]- Manual therapy, [phone]- Gait training, [phone]- Canalith repositioning, Patient/Family education, Balance training, Stair training, Taping, Dry Needling, Joint mobilization, Spinal mobilization, Vestibular training, Cryotherapy, and Moist heat  PLAN FOR NEXT SESSION: discuss fitness program upon DC; work towards STS without UEs, overhead lifting and simulating opening car door for UE strength, dynamic balance   Louana Terrilyn Christians, PT, DPT 05/08/24 5:01 PM  [facility] at [facility] [address], [address] [place], [place] [address] Phone # [phone] Fax # [phone]

## 2024-05-08 ENCOUNTER — Ambulatory Visit: Admitting: Physical Therapy

## 2024-05-08 ENCOUNTER — Encounter: Payer: Self-pay | Admitting: Physical Therapy

## 2024-05-08 DIAGNOSIS — R2681 Unsteadiness on feet: Secondary | ICD-10-CM | POA: Diagnosis not present

## 2024-05-08 DIAGNOSIS — R262 Difficulty in walking, not elsewhere classified: Secondary | ICD-10-CM | POA: Diagnosis not present

## 2024-05-08 DIAGNOSIS — M6281 Muscle weakness (generalized): Secondary | ICD-10-CM

## 2024-05-08 DIAGNOSIS — R29898 Other symptoms and signs involving the musculoskeletal system: Secondary | ICD-10-CM | POA: Diagnosis not present

## 2024-05-08 NOTE — Patient Instructions (Addendum)
 Here is the [facility] website where you can find each month's class schedule:  [email or web address]

## 2024-05-09 ENCOUNTER — Ambulatory Visit: Admitting: Neurology

## 2024-05-09 ENCOUNTER — Other Ambulatory Visit

## 2024-05-09 ENCOUNTER — Encounter: Payer: Self-pay | Admitting: Neurology

## 2024-05-09 VITALS — BP 145/60 | HR 57 | Ht 63.0 in | Wt 123.8 lb

## 2024-05-09 DIAGNOSIS — R498 Other voice and resonance disorders: Secondary | ICD-10-CM | POA: Diagnosis not present

## 2024-05-09 DIAGNOSIS — R531 Weakness: Secondary | ICD-10-CM | POA: Diagnosis not present

## 2024-05-09 NOTE — Patient Instructions (Signed)
 Good to see you.  Have bloodwork done for CK, myasthenia panel  2. Schedule MRI brain with and without contrast  3. Schedule EMG/NCV of right upper and lower extremities  4. Follow-up after tests, call for any changes

## 2024-05-09 NOTE — Progress Notes (Signed)
 NEUROLOGY FOLLOW UP OFFICE NOTE  EDIT [name] [id number] 1941/04/17  HISTORY OF PRESENT ILLNESS: I had the pleasure of seeing [name] in follow-up in the neurology clinic on 05/09/2024.  The patient was last seen in December 2023 for auditory hallucinations. She is alone in the office today.  Records and images were personally reviewed where available. Exam on initial visit in 10/2022 did not show any parkinsonian signs. MRI brain in in 10/2022 no acute changes, there was mild to moderate diffuse atrophy, mild chronic microvascular disease with chronic lacunar infarct within the posterior right centrum semiovale and tiny chronic infarct within the left cerebellar hemisphere. EEG in 10/2022 was normal. We discussed doing a prolonged EEG however she reported the sounds she was hearing were not like before, occurring every now and then and not lasting long. She wanted to see if symptoms improved with new hearing aids. She reports that the  auditory symptoms went away, however since her TAVR surgery in August  2024, she hears a noises in her right ear every now and then. She states she got new hearing aids. She is not hearing the hymns however since the surgery, she keeps singing them in her mind all the time.  She had TAVR surgery in 06/2023 but despite doing cardiac rehab, continued to have fatigue and imbalance, marked bilateral LE weakness, decreased gait speed and underwent outpatient PT. On her PCP appointment in April 2025, she was reporting significant fatigue, softer weak voice, difficulty writing, poor balance, and being slower. She presents today for re-evaluation. She states all these symptoms started after the surgery. She states she does not feel like my whole self at all since the surgery. She has been tired all the time, if she sits in a quiet spot she could fall asleep anytime. She gets at least 10-11 hours of sleep taking 1/4 tablet of Ambien . She reports having no energy that she could  not even open the door to our office. She cannot write with her right hand, she used to write a lot in cursive but now handwriting is smaller. She has lost her voice, it is not worse as the day progresses. Sometimes she has problems swallowing, no choking. No diplopia, she saw her eye doctor and has decreased vision on the right eye due to dry eye. She reported her head feeling funny when she gets up in the morning. She states she is very nervous when she gets up in the morning, there is no clear reason, she is shaky later in the day around 3-4 pm but the anxiety gets better. It feels like when you reboot on a computer.   She has been having pain in the right foot, it is tender all the time. There is no burning or paresthesias. She cannot wear the right shoe, she has to keep a gauze over the dorsum of her right foot and cuts the top of her sock. The foot is tender all the time that her podiatrist cannot touch it. She reports her foot is black in the morning and is very red right now. She reports there was a complication during surgery and she almost lost my right leg, but since then she has seen Vascular and circulation is okay.  She has 7 steps in her townhouse and has difficulty with them. She is waiting for a spot at [name]. She has gotten dependent on her cane, afraid she will fall. Prior to surgery, she did not need any assistive devices.  She denies any falls. She has also become incontinent of urine since the surgery, medications have not helped. She wears pull-ups at night but can hold it during the day (does not urinate much). No bowel dysfunction. She has back pain. She is driving.   History on Initial Assessment 10/28/2022: This is a very pleasant 83 year old right-handed woman with a history of hypertension, hyperlipidemia, hypothyroidism, CAD s/p CABG, moderate aortic stenosis, presenting for evaluation of auditory hallucinations. Review of records on EPIC done on visits with Cardiology,  ENT, PCP. She was evaluated by ENT Dr. Carlie on 09/22/22 with note that about 10 days ago, she received news that her heart was not functioning well and got upset. She started Jardiance  on 10/16. Per notes,  a couple of days later, she was humming a hymn but then heard it continue after she stopped humming. She was sitting then all of a sudden heard the same hymn being sung right back to her on the right side. This lasted several minutes with no associated headache, dizziness, focal symptoms. Since then, the auditory hallucinations continued. She notes that they are different songs, church hymns, old hymns, now Christmas carols, sung by a different man or woman, or sometimes together. It would be the same line repeatedly for 30 minutes (go tell it to the mountain). Interestingly, the hymns stop when she is talking to someone, on the phone, or when she turns the music louder. She mostly notices it at home, but has heard it in her car. It stops when she turns the radio up. When she goes to another room where the TV is not audible, the hymns would resume. It appears she mostly notices it when she is alone. If she were with someone, even if they are not speaking, she does not hear the hymns. It stops before she goes to sleep and does not wake her out of sleep. She mostly hears it on the right ear. Aside from the hymns, she does not hear any conversations, no visual component. Of note, Dr. Geofm reported that in March when she had a very stressful situation with increased anxiety, loss of appetite and insomnia, she was switched to Sertraline  and had similar auditory hallucinations that seemed to stop after stopping Sertraline .   [name] denies any staring/unresponsive episodes. She denies any loss of time, olfactory/gustatory hallucinations, rising epigastric sensation, focal numbness/tingling/weakness, myoclonic jerks. No headaches, dizziness, diplopia, dysarthria/dysphagia, neck/back pain, bowel/bladder  dysfunction. She had an unintentional 10-lb weight loss in a month, no appetite. She can tell a change in her memory, now she depends on her phone on how to spell a word or do math. [name] denies any memory concerns. She lives alone. She denies missing medications, [name] reports she is really good, with a good system at home. All her bills are on autopay. She denies getting lost driving, but can tell she is not as alert. She does not cook, no difficulties operating the microwave. No family history of dementia. She has shaking when nervous. She feels the increase in citalopram  to 20mg  has calmed her down. She was having panic attacks, there was a lot going on at the beginning of the year, little traumatic baby steps, but Charlies has noticed a definite improvement in mood, there has been an upswing. She does not sleep well, anxiety increased since the hallucinations started, she was switched from Ambien  to Seroquel  25mg  which made her feel drunk and had bad dreams, with no change in sleep or in the  hallucinations. She is back on prn Ambien  5mg  getting 5-6 hours of sleep. She used to get 10 hours of sleep. [name] notes they are working on getting new hearing aids. She had a normal birth and early development.  There is no history of febrile convulsions, CNS infections such as meningitis/encephalitis, significant traumatic brain injury, neurosurgical procedures, or family history of seizures.   I personally reviewed MRI brain without contrast done 10/26/22 with no acute changes, there was mild to moderate diffuse atrophy, mild chronic microvascular disease. There was complete opacification of the left frontal sinus and incompletely assessed cervical spondylosis with multilevel grade 1 spondylolisthesis.     PAST MEDICAL HISTORY: Past Medical History:  Diagnosis Date   Allergy     SEASONAL   Anxiety    BREAST CANCER 1991   left, 1991; B mastectomy   CAD (coronary artery disease)    s/p CABG x5 in 2007    Cataract    BILATERAL-REMOVED   Depression    DIVERTICULOSIS    GERD    History of DVT (deep vein thrombosis)    HYPERLIPIDEMIA    Hypertension    HYPOTHYROIDISM    Occlusion and stenosis of carotid artery without mention of cerebral infarction    OSTEOPOROSIS    S/P TAVR (transcatheter aortic valve replacement) 06/21/2023   s/p TAVR with a 23mm Edwards S3UR via the TF approach by Dr. Verlin & Dr. Maryjane   Severe aortic stenosis     MEDICATIONS: Current Outpatient Medications on File Prior to Visit  Medication Sig Dispense Refill   alendronate  (FOSAMAX ) 70 MG tablet Take 1 tablet (70 mg total) by mouth every 7 (seven) days. Take with a full glass of water  on an empty stomach. 12 tablet 3   aspirin  81 MG tablet Take 81 mg by mouth daily.       atorvastatin  (LIPITOR) 20 MG tablet TAKE 1 TABLET BY MOUTH EVERY DAY 90 tablet 1   Calcium  Carbonate (CALTRATE 600 PO) Take 1 tablet by mouth 2 (two) times daily.       citalopram  (CELEXA ) 20 MG tablet TAKE 1 TABLET BY MOUTH EVERY DAY 90 tablet 2   co-enzyme Q-10 30 MG capsule Take 30 mg by mouth daily.     famotidine  (PEPCID ) 40 MG tablet TAKE 1 TABLET BY MOUTH EVERY DAY 90 tablet 1   fluticasone  (FLONASE ) 50 MCG/ACT nasal spray Place 1 spray into both nostrils daily as needed for allergies or rhinitis. 9.9 mL 5   levothyroxine  (SYNTHROID ) 75 MCG tablet TAKE 1 TABLET BY MOUTH EVERY MORNING BEFORE BREAKFAST 90 tablet 1   losartan  (COZAAR ) 25 MG tablet Take 1 tablet (25 mg total) by mouth daily. (Patient taking differently: Take 25 mg by mouth daily. Per patient taking 1/2 tablet (12.5 mg) per PCP) 90 tablet 3   meloxicam  (MOBIC ) 7.5 MG tablet TAKE 1/2 TO 1 TABLET EVERY DAY 90 tablet 0   metoprolol  tartrate (LOPRESSOR ) 25 MG tablet TAKE 1 TABLET BY MOUTH TWICE A DAY 180 tablet 1   Multiple Vitamins-Minerals (MULTIVITAMIN) LIQD Take 1 tablet by mouth daily.     Multiple Vitamins-Minerals (PRESERVISION AREDS 2) CAPS Take 1 capsule by mouth 2  (two) times daily.     senna-docusate (SENOKOT-S) 8.6-50 MG per tablet Take 1 tablet by mouth daily.       VITAMIN D , CHOLECALCIFEROL, PO Take 1 tablet by mouth daily.     zolpidem  (AMBIEN ) 5 MG tablet TAKE 1 TABLET (5 MG TOTAL) BY MOUTH EVERY  DAY AT BEDTIME AS NEEDED FOR SLEEP 30 tablet 3   No current facility-administered medications on file prior to visit.    ALLERGIES: Allergies  Allergen Reactions   Clarithromycin Other (See Comments)    PATIENT UNSURE OF REACTION   Clindamycin Other (See Comments)   Codeine     REACTION: nausea   Levofloxacin     REACTION: nausea   Morphine Other (See Comments)    Nausea  Nausea    Naproxen     REACTION: nausea   Omeprazole  Other (See Comments)    Made her jittery, made her feel weird, ?rash   Penicillins Other (See Comments) and Rash    REACTION: RASH REACTION: RASH    Pneumococcal Vaccines Rash    Small rash on arm at injection site.     FAMILY HISTORY: Family History  Problem Relation Age of Onset   Colon cancer Sister    Colon cancer Sister    Breast cancer Mother    Lung disease Mother    Emphysema Father    Esophageal cancer Neg Hx    Rectal cancer Neg Hx    Stomach cancer Neg Hx    Thyroid  disease Neg Hx     SOCIAL HISTORY: Social History   Socioeconomic History   Marital status: Divorced    Spouse name: Not on file   Number of children: 2   Years of education: Not on file   Highest education level: Not on file  Occupational History   Occupation: retired  Tobacco Use   Smoking status: Never   Smokeless tobacco: Never  Vaping Use   Vaping status: Never Used  Substance and Sexual Activity   Alcohol use: No    Alcohol/week: 0.0 standard drinks of alcohol   Drug use: No   Sexual activity: Not Currently  Other Topics Concern   Not on file  Social History Narrative   Divorced x 3, lives alone   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, school psychologist   Right handed    Social Drivers of  Health   Financial Resource Strain: Low Risk  (01/23/2024)   Overall Financial Resource Strain (CARDIA)    Difficulty of Paying Living Expenses: Not hard at all  Food Insecurity: No Food Insecurity (01/23/2024)   Hunger Vital Sign    Worried About Running Out of Food in the Last Year: Never true    Ran Out of Food in the Last Year: Never true  Transportation Needs: No Transportation Needs (01/23/2024)   PRAPARE - Administrator, Civil Service (Medical): No    Lack of Transportation (Non-Medical): No  Physical Activity: Inactive (01/23/2024)   Exercise Vital Sign    Days of Exercise per Week: 0 days    Minutes of Exercise per Session: 0 min  Stress: Stress Concern Present (01/23/2024)   [employer] of Occupational Health - Occupational Stress Questionnaire    Feeling of Stress : To some extent  Social Connections: Moderately Integrated (01/23/2024)   Social Connection and Isolation Panel    Frequency of Communication with Friends and Family: More than three times a week    Frequency of Social Gatherings with Friends and Family: More than three times a week    Attends Religious Services: More than 4 times per year    Active Member of [employer] or Organizations: Yes    Attends Banker Meetings: Never    Marital Status: Divorced  Catering manager Violence: Not At Risk (  06/25/2023)   Humiliation, Afraid, Rape, and Kick questionnaire    Fear of Current or Ex-Partner: No    Emotionally Abused: No    Physically Abused: No    Sexually Abused: No     PHYSICAL EXAM: Vitals:   05/09/24 1357  BP: (!) 145/60  Pulse: (!) 57  SpO2: 96%   General: No acute distress Head:  Normocephalic/atraumatic Skin/Extremities: No rash, no edema Neurological Exam: alert and oriented to person, place, and time. No aphasia or dysarthria. Fund of knowledge is appropriate.  Recent and remote memory are intact.  Attention and concentration are normal.  Cranial nerves: Pupils equal,  round. Extraocular movements intact with no nystagmus. Visual fields full.  No facial asymmetry.  Difficulty with lingual sounds. Motor: Bulk and tone normal, no cogwheeling, muscle strength 5/5 throughout except for 4/5 right thumb flexion, finger extension. Good head flexion and extension, no pronator drift. Sensation: patchy sensory changes with decreased pin on right UE, decreased cold on left UE. Hyperesthesia to pin on both feet. Decreased vibration sense to knees bilaterally. Reflexes +1 both UE, +2 bilateral patella, absent ankle jerks. Upgoing toe on left. Finger to nose testing intact.  Gait slow and cautious reporting bilateral knee pain, right foot slightly turned out when walking. Romberg negative. No tremors. No bradykinesia with good finger and foot taps, RAMs. Negative postural instability.   IMPRESSION: This is a very pleasant 83 yo RH woman with a history of hypertension, hyperlipidemia, hypothyroidism, CAD s/p CABG, moderate aortic stenosis, iniitally seen for auditory hallucinations that seemed to improve with hearing aids. She presents today for new symptoms that she reports all started after TAVR surgery in 06/2023. She reports significant fatigue, lower extremity weakness, voice changes, changes in handwriting. Etiology of symptoms unclear, her neurological exam today does not show any parkinsonian signs. There are some sensory changes more in the lower extremities, right hand weakness. She reports right foot pain. MRI brain with and without contrast will be ordered to assess for underlying structural abnormality. Check CK and myasthenia panel. She will be scheduled for an EMG/NCV of the right upper and lower extremities. Continue with outpatient PT. Follow-up after tests, call for any changes.    Thank you for allowing me to participate in her care.  Please do not hesitate to call for any questions or concerns.  The duration of this appointment visit was 60 minutes of face-to-face and  non face to face time with the patient.  Greater than 50% of this time was spent in counseling, explanation of diagnosis, planning of further management, and coordination of care.   Darice Shivers, M.D.   CC: Dr. Geofm

## 2024-05-14 NOTE — Therapy (Signed)
 OUTPATIENT PHYSICAL THERAPY NEURO PROGRESS NOTE   Patient Name: [name] MRN: [id number] DOB:1940-11-28, 83 y.o., female Today's Date: 05/15/2024   PCP: Geofm Glade JINNY, MD  REFERRING PROVIDER: Geofm Glade JINNY, MD    Progress Note Reporting Period 03/20/24 to 05/15/24  See note below for Objective Data and Assessment of Progress/Goals.       END OF SESSION:  PT End of Session - 05/15/24 1656     Visit Number 20    Number of Visits 21    Date for PT Re-Evaluation 05/23/24    Authorization Type Medicare/BCBS    Authorization - Number of Visits 60    Progress Note Due on Visit 25    PT Start Time 1615    PT Stop Time 1657    PT Time Calculation (min) 42 min    Activity Tolerance Patient tolerated treatment well    Behavior During Therapy WFL for tasks assessed/performed                     Past Medical History:  Diagnosis Date   Allergy     SEASONAL   Anxiety    BREAST CANCER 1991   left, 1991; B mastectomy   CAD (coronary artery disease)    s/p CABG x5 in 2007   Cataract    BILATERAL-REMOVED   Depression    DIVERTICULOSIS    GERD    History of DVT (deep vein thrombosis)    HYPERLIPIDEMIA    Hypertension    HYPOTHYROIDISM    Occlusion and stenosis of carotid artery without mention of cerebral infarction    OSTEOPOROSIS    S/P TAVR (transcatheter aortic valve replacement) 06/21/2023   s/p TAVR with a 23mm Edwards S3UR via the TF approach by Dr. Verlin & Dr. Maryjane   Severe aortic stenosis    Past Surgical History:  Procedure Laterality Date   APPENDECTOMY     COLONOSCOPY     CORONARY ARTERY BYPASS GRAFT     5        2007   ENDARTERECTOMY FEMORAL Right 06/22/2023   Procedure: RIGHT FEMORAL ENDARTERECTOMY WITH PATCH ANGIOPLASTY;  Surgeon: Serene Gaile ORN, MD;  Location: [facility] OR;  Service: Vascular;  Laterality: Right;   FEMORAL-POPLITEAL BYPASS GRAFT Right 06/22/2023   Procedure: FEMORAL THROMBECTOMY;  Surgeon: Serene Gaile ORN, MD;  Location:  [facility] OR;  Service: Vascular;  Laterality: Right;   INTRAOPERATIVE TRANSTHORACIC ECHOCARDIOGRAM N/A 06/21/2023   Procedure: INTRAOPERATIVE TRANSTHORACIC ECHOCARDIOGRAM;  Surgeon: Verlin Lonni BIRCH, MD;  Location: [facility];  Service: Open Heart Surgery;  Laterality: N/A;   MASTECTOMY     left with reconstruction and lymph node excision  1992   MASTECTOMY     right with reconstruction   PATCH ANGIOPLASTY Right 06/22/2023   Procedure: PATCH ANGIOPLASTY OF RIGHT FEMORAL ARTERY USING BOVINE PERICARDIAL PATCH;  Surgeon: Serene Gaile ORN, MD;  Location: [facility] OR;  Service: Vascular;  Laterality: Right;   REVISION RECONSTRUCTED BREAST Left 02/2014   willard (plastics in [place])   right ankle arthroscopy     RIGHT/LEFT HEART CATH AND CORONARY ANGIOGRAPHY N/A 06/09/2023   Procedure: RIGHT/LEFT HEART CATH AND CORONARY ANGIOGRAPHY;  Surgeon: Verlin Lonni BIRCH, MD;  Location: [facility];  Service: Cardiovascular;  Laterality: N/A;   TEAR DUCT CANAL     TONSILLECTOMY AND ADENOIDECTOMY     TRANSCATHETER AORTIC VALVE REPLACEMENT, TRANSFEMORAL N/A 06/21/2023   Procedure: Transcatheter Aortic Valve Replacement, Transfemoral;  Surgeon: Verlin Lonni  D, MD;  Location: [facility];  Service: Open Heart Surgery;  Laterality: N/A;   UPPER GASTROINTESTINAL ENDOSCOPY     VAGINAL HYSTERECTOMY     ovaries not excised   Patient Active Problem List   Diagnosis Date Noted   Weakness of voice 03/16/2024   Bradykinesia 02/17/2024   Physical deconditioning 01/25/2024   Abnormal ultrasound 07/27/2023   Lung nodule seen on imaging study 07/27/2023   Fatigue 07/27/2023   Constipation 06/26/2023   S/P TAVR (transcatheter aortic valve replacement) 06/21/2023   CAD (coronary artery disease)    History of CVA (cerebrovascular accident) 10/27/2022   Bilateral sensorineural hearing loss 09/22/2022   Chronic diastolic heart failure (HCC) 08/04/2022   Prediabetes 08/04/2022   Acute left-sided low  back pain without sciatica 07/30/2020   Cavus deformity of foot 05/01/2020   Poor balance 05/10/2018   Left ventricular diastolic dysfunction 07/27/2017   Essential hypertension 07/27/2017   Fall 03/02/2017   Urinary incontinence 02/16/2017   Chronic venous insufficiency 08/20/2016   Xiphoid pain 03/05/2016   Insomnia 01/16/2016   Anxiety and depression 12/24/2015   Asthma, mild intermittent, well-controlled 11/24/2015   Carotid artery disease (HCC) 06/29/2011   Hypothyroidism 11/11/2010   Hyperlipidemia 11/11/2010   Senile osteoporosis 11/11/2010   RHINOSINUSITIS, RECURRENT 10/29/2008   Personal history of malignant neoplasm of breast 03/27/2008   GERD (gastroesophageal reflux disease) 03/27/2008   DIVERTICULOSIS 03/27/2008   Atherosclerosis of native coronary artery of native heart with angina pectoris (HCC) 08/23/2007   Severe aortic stenosis 08/23/2007   DVT 08/23/2007    ONSET DATE: August 2024  REFERRING DIAG: R26.89 (ICD-10-CM) - Poor balance R53.81 (ICD-10-CM) - Physical deconditioning  THERAPY DIAG:  Muscle weakness (generalized)  Difficulty in walking, not elsewhere classified  Unsteadiness on feet  Other symptoms and signs involving the musculoskeletal system  Rationale for Evaluation and Treatment: Rehabilitation  SUBJECTIVE:                                                                                                                                                                                             SUBJECTIVE STATEMENT: I went to [employer] and realized again that I'm slow. The neurologist is concerned about my whole right side being weak. Getting a brain MRI and an EMG. Patient reports that opening a car door is still difficult for her and reports no improvement in this- notices that she usually uses her R hand which is her weaker side.   Pt accompanied by: self  PERTINENT HISTORY: Breast CA s/p B mastectomy, CAD, cataract, depression, GERD, HLD,  HTN, osteoporosis, CABG 2007, femoral-popliteal bypass graft  2024  PAIN:  Are you having pain? Yes: NPRS scale: no Pain location: B knee Pain description: achey Aggravating factors: stairs Relieving factors: mobic     PRECAUTIONS: Fall and Other: osteoporosis  RED FLAGS: None   WEIGHT BEARING RESTRICTIONS: No  FALLS: Has patient fallen in last 6 months? 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  PATIENT GOALS: work on endurance and improve balance   OBJECTIVE:     TODAY'S TREATMENT: 05/15/24 Activity Comments  5xSTS  Pt unable to perform initially. Required cueing to reach forward and down to stand. Cues to scoot forward.  31.27 sec without UE support; limited eccentric lower  Nustep L5 x 6 min UEs/LEs  Cueing for pacing                  HOME EXERCISE PROGRAM Last updated: 05/15/24 Access Code: [email or web address] URL: [email or web address] Date: 05/15/2024 Prepared by: [facility] - Outpatient  Rehab - [facility]  Exercises - Feet Together Balance at Counter Top Eyes Closed  - 1 x daily - 7 x weekly - 3 sets - 30 sec hold - Seated Bilateral Elbow Flexion with Resistance on Swiss Ball  - 1 x daily - 3 x weekly - 2 sets - 10 reps - Seated Elbow Extension with Self-Anchored Resistance  - 1 x daily - 3 x weekly - 2 sets - 10 reps - Standing Shoulder Row with Anchored Resistance  - 1 x daily - 3 x weekly - 2 sets - 10 reps - Sit to Stand with Arms Crossed  - 1 x daily - 5 x weekly - 2 sets - 10 reps - Side Stepping with Counter Support  - 1 x daily - 7 x weekly - 3 sets - 1-2 min rounds hold - Standing Hip Abduction with Ankle Weight  - 1 x daily - 5 x weekly - 2 sets - 10 reps - Standing Alternating Knee Flexion with Ankle Weights  - 1 x daily - 5 x weekly - 2  sets - 10 reps   PATIENT EDUCATION: Education details: answered pt's questions on accessing low impact workout videos for seniors, detailed HEP update, edu on importance of maintaining exercise regimen to maintain gains made in therapy, edu on plans for DC next session  Person educated: Patient Education method: Explanation, Demonstration, Tactile cues, Verbal cues, and Handouts Education comprehension: verbalized understanding and returned demonstration       Note: Objective measures were completed at Evaluation unless otherwise noted.  Vitals: 98% spO2, 64bpm; 134/64 mmHg  DIAGNOSTIC FINDINGS: none recent  COGNITION: Overall cognitive status: Within functional limits for tasks assessed   SENSATION: Reports reduced sensation in B feet  POSTURE: rounded shoulders, forward head, and increased thoracic kyphosis  LOWER EXTREMITY MMT:    MMT (in sitting) Right Eval Left Eval  Hip flexion 3 3+  Hip extension    Hip abduction 3+ 3+  Hip adduction 3+ 3+  Hip internal rotation    Hip external rotation    Knee flexion 3+ 4-  Knee extension 3+ 4-  Ankle dorsiflexion 3 3  Ankle plantarflexion 2+ 2+  Ankle inversion    Ankle eversion    (Blank rows = not tested)  GAIT: Gait pattern: Very slow, short B step length, occasionally tripping on cane Assistive device utilized:  Single point cane Level of assistance: CGA   FUNCTIONAL TESTS:  5 times sit to stand: 47.01 sec using B UEs  94% spO2 10 meter walk test: 43.89 sec with SPC 0.75 ft/sec                                                                                                                               TREATMENT DATE: 01/31/24      GOALS: Goals reviewed with patient? Yes  SHORT TERM GOALS: Target date: 02/21/2024  Patient to be independent with initial HEP. Baseline: HEP initiated; good recall Goal status: MET    LONG TERM GOALS: Target date: 05/23/2024  Patient to be independent with advanced  HEP. Baseline: Not yet initiated ; met for current 03/08/24 Goal status: IN PROGRESS 05/15/24  Patient to demonstrate B LE strength >/=4/5.  Baseline: See above; NT 03/08/24; MET 04/04/24 Goal status: MET 04/04/24  Patient to demonstrate gait speed of at least 1.5 ft/sec in order to improve access to community.  Baseline: 0.75 ft/sec; 1.4 ft/sec 03/08/24; 16.64 sec with cane(1.97 ft/sec) , 18.64 sec without cane (1.79 ft/sec) 04/04/24 Goal status: MET 04/04/24  Patient to demonstrate 5xSTS test in <25 sec in order to decrease risk of falls.  Baseline: 47.01 sec using B UEs; 19.29 sec with B UEs 03/08/24 Goal status: MET 03/08/24  Patient to demonstrate 5xSTS test in <25 sec without UEs in order to decrease risk of falls.  Baseline: 19.29 sec with B UEs 03/08/24; 31.27 sec without UE support 05/15/24  Goal status: IN PROGRESS 05/15/24   Patient to score at least 46/56 on Berg in order to decrease risk of falls.  Baseline: 41/56; 44/56 03/08/24; 49/56 04/04/24 Goal status: MET 04/04/24  Patient to report plans to participate in home or community exercise program to maintain fitness.  Baseline: not currently participating; unchanged 03/08/24; pt is looking into ACT 04/04/24; pt reports plans to continue with youtube videos and HEP 05/15/24 Goal status: IN PROGRESS 05/15/24  Patient to report 50% improvement in ability to open car door.  Baseline: reports difficulty d/t weakness 03/08/24; reports remaining difficulty 04/04/24; pt reports no change 05/15/24 Goal status: IN PROGRESS 05/15/24    ASSESSMENT:  CLINICAL IMPRESSION: Patient arrived to session with report of getting scheduled for EMG and MRI d/t Neurologist's concern about R sided weakness. Patient reports that opening a car door is still difficult for her and reports no improvement in this. Patient requires cues for set up for transfers- able to perform 5xSTS without UE support after cueing. Time spent on educating patient on importance of maintaining  fitness as she has not looked into exercise programs discussed last session. Provided examples of youtube exercise videos for seniors and HEP was updated for max benefit/carryover. Plan for DC next session.    OBJECTIVE IMPAIRMENTS: Abnormal gait, decreased activity tolerance, decreased balance, decreased endurance, decreased knowledge of use of DME, decreased strength, decreased safety awareness, and postural  dysfunction.   ACTIVITY LIMITATIONS: carrying, lifting, bending, standing, squatting, stairs, transfers, bathing, toileting, dressing, reach over head, hygiene/grooming, and locomotion level  PARTICIPATION LIMITATIONS: meal prep, cleaning, laundry, driving, shopping, community activity, and church  PERSONAL FACTORS: Age, Fitness, Past/current experiences, Time since onset of injury/illness/exacerbation, and 3+ comorbidities: Breast CA s/p B mastectomy, CAD, cataract, depression, GERD, HLD, HTN, osteoporosis, CABG 2007, femoral-popliteal bypass graft 2024 are also affecting patient's functional outcome.   REHAB POTENTIAL: Good  CLINICAL DECISION MAKING: Evolving/moderate complexity  EVALUATION COMPLEXITY: Moderate  PLAN:  PT FREQUENCY: 2x/week  PT DURATION: 4 weeks  PLANNED INTERVENTIONS: 97164- PT Re-evaluation, 97110-Therapeutic exercises, 97530- Therapeutic activity, 97112- Neuromuscular re-education, 97535- Self Care, [id number]- Manual therapy, [phone]- Gait training, [phone]- Canalith repositioning, Patient/Family education, Balance training, Stair training, Taping, Dry Needling, Joint mobilization, Spinal mobilization, Vestibular training, Cryotherapy, and Moist heat  PLAN FOR NEXT SESSION: update goals and DC  Louana Terrilyn Christians, PT, DPT 05/15/24 5:00 PM  [facility] at [facility] [address] [place], [place] [address] Phone # [phone] Fax # [phone]

## 2024-05-15 ENCOUNTER — Ambulatory Visit: Attending: Internal Medicine | Admitting: Physical Therapy

## 2024-05-15 ENCOUNTER — Encounter: Payer: Self-pay | Admitting: Physical Therapy

## 2024-05-15 DIAGNOSIS — R2681 Unsteadiness on feet: Secondary | ICD-10-CM | POA: Diagnosis not present

## 2024-05-15 DIAGNOSIS — M6281 Muscle weakness (generalized): Secondary | ICD-10-CM | POA: Insufficient documentation

## 2024-05-15 DIAGNOSIS — R29898 Other symptoms and signs involving the musculoskeletal system: Secondary | ICD-10-CM | POA: Diagnosis not present

## 2024-05-15 DIAGNOSIS — R5381 Other malaise: Secondary | ICD-10-CM | POA: Diagnosis not present

## 2024-05-15 DIAGNOSIS — R262 Difficulty in walking, not elsewhere classified: Secondary | ICD-10-CM | POA: Insufficient documentation

## 2024-05-15 DIAGNOSIS — R471 Dysarthria and anarthria: Secondary | ICD-10-CM | POA: Insufficient documentation

## 2024-05-15 LAB — MYASTHENIA GRAVIS PANEL 2
A CHR BINDING ABS: <0.3 nmol/L
ACHR Blocking Abs: <15 %{inhibition} (ref <15)
Acetylchol Modul Ab: 1 %{inhibition}

## 2024-05-15 LAB — CK: Total CK: 135 U/L (ref 16–215)

## 2024-05-21 NOTE — Therapy (Signed)
 OUTPATIENT PHYSICAL THERAPY NEURO DISCHARGE NOTE   Patient Name: [name] MRN: [id number] DOB:[birth year], 83 y.o., female Today's Date: 05/22/2024   PCP: Geofm Glade JINNY, MD  REFERRING PROVIDER: Geofm Glade JINNY, MD    Progress Note Reporting Period 05/22/24 to 05/22/24  See note below for Objective Data and Assessment of Progress/Goals.       END OF SESSION:  PT End of Session - 05/22/24 1645     Visit Number 21    Number of Visits 21    Date for PT Re-Evaluation 05/23/24    Authorization Type Medicare/BCBS    Authorization - Number of Visits 60    Progress Note Due on Visit 25    PT Start Time 1618    PT Stop Time 1656    PT Time Calculation (min) 38 min    Activity Tolerance Patient tolerated treatment well    Behavior During Therapy WFL for tasks assessed/performed                      Past Medical History:  Diagnosis Date   Allergy     SEASONAL   Anxiety    BREAST CANCER 1991   left, 1991; B mastectomy   CAD (coronary artery disease)    s/p CABG x5 in 2007   Cataract    BILATERAL-REMOVED   Depression    DIVERTICULOSIS    GERD    History of DVT (deep vein thrombosis)    HYPERLIPIDEMIA    Hypertension    HYPOTHYROIDISM    Occlusion and stenosis of carotid artery without mention of cerebral infarction    OSTEOPOROSIS    S/P TAVR (transcatheter aortic valve replacement) 06/21/2023   s/p TAVR with a 23mm Edwards S3UR via the TF approach by Dr. Verlin & Dr. Maryjane   Severe aortic stenosis    Past Surgical History:  Procedure Laterality Date   APPENDECTOMY     COLONOSCOPY     CORONARY ARTERY BYPASS GRAFT     5        2007   ENDARTERECTOMY FEMORAL Right 06/22/2023   Procedure: RIGHT FEMORAL ENDARTERECTOMY WITH PATCH ANGIOPLASTY;  Surgeon: Serene Gaile ORN, MD;  [facility]: [facility];  Service: Vascular;  Laterality: Right;   FEMORAL-POPLITEAL BYPASS GRAFT Right 06/22/2023   Procedure: FEMORAL THROMBECTOMY;  Surgeon: Serene Gaile ORN, MD;   Location: [facility];  Service: Vascular;  Laterality: Right;   INTRAOPERATIVE TRANSTHORACIC ECHOCARDIOGRAM N/A 06/21/2023   Procedure: INTRAOPERATIVE TRANSTHORACIC ECHOCARDIOGRAM;  Surgeon: Verlin Lonni BIRCH, MD;  [facility];  Service: Open Heart Surgery;  Laterality: N/A;   MASTECTOMY     left with reconstruction and lymph node excision  1992   MASTECTOMY     right with reconstruction   PATCH ANGIOPLASTY Right 06/22/2023   Procedure: PATCH ANGIOPLASTY OF RIGHT FEMORAL ARTERY USING BOVINE PERICARDIAL PATCH;  Surgeon: Serene Gaile ORN, MD;  [facility];  Service: Vascular;  Laterality: Right;   REVISION RECONSTRUCTED BREAST Left 02/2014   willard (plastics in [place])   right ankle arthroscopy     RIGHT/LEFT HEART CATH AND CORONARY ANGIOGRAPHY N/A 06/09/2023   Procedure: RIGHT/LEFT HEART CATH AND CORONARY ANGIOGRAPHY;  Surgeon: Verlin Lonni BIRCH, MD;  [facility];  Service: Cardiovascular;  Laterality: N/A;   TEAR DUCT CANAL     TONSILLECTOMY AND ADENOIDECTOMY     TRANSCATHETER AORTIC VALVE REPLACEMENT, TRANSFEMORAL N/A 06/21/2023   Procedure: Transcatheter Aortic Valve Replacement, Transfemoral;  Surgeon: Verlin,  Lonni BIRCH, MD;  [facility];  Service: Open Heart Surgery;  Laterality: N/A;   UPPER GASTROINTESTINAL ENDOSCOPY     VAGINAL HYSTERECTOMY     ovaries not excised   Patient Active Problem List   Diagnosis Date Noted   Weakness of voice 03/16/2024   Bradykinesia 02/17/2024   Physical deconditioning 01/25/2024   Abnormal ultrasound 07/27/2023   Lung nodule seen on imaging study 07/27/2023   Fatigue 07/27/2023   Constipation 06/26/2023   S/P TAVR (transcatheter aortic valve replacement) 06/21/2023   CAD (coronary artery disease)    History of CVA (cerebrovascular accident) 10/27/2022   Bilateral sensorineural hearing loss 09/22/2022   Chronic diastolic heart failure (HCC) 08/04/2022   Prediabetes 08/04/2022   Acute  left-sided low back pain without sciatica 07/30/2020   Cavus deformity of foot 05/01/2020   Poor balance 05/10/2018   Left ventricular diastolic dysfunction 07/27/2017   Essential hypertension 07/27/2017   Fall 03/02/2017   Urinary incontinence 02/16/2017   Chronic venous insufficiency 08/20/2016   Xiphoid pain 03/05/2016   Insomnia 01/16/2016   Anxiety and depression 12/24/2015   Asthma, mild intermittent, well-controlled 11/24/2015   Carotid artery disease (HCC) 06/29/2011   Hypothyroidism 11/11/2010   Hyperlipidemia 11/11/2010   Senile osteoporosis 11/11/2010   RHINOSINUSITIS, RECURRENT 10/29/2008   Personal history of malignant neoplasm of breast 03/27/2008   GERD (gastroesophageal reflux disease) 03/27/2008   DIVERTICULOSIS 03/27/2008   Atherosclerosis of native coronary artery of native heart with angina pectoris (HCC) 08/23/2007   Severe aortic stenosis 08/23/2007   DVT 08/23/2007    ONSET DATE: August 2024  REFERRING DIAG: R26.89 (ICD-10-CM) - Poor balance R53.81 (ICD-10-CM) - Physical deconditioning  THERAPY DIAG:  Muscle weakness (generalized)  Difficulty in walking, not elsewhere classified  Unsteadiness on feet  Other symptoms and signs involving the musculoskeletal system  Rationale for Evaluation and Treatment: Rehabilitation  SUBJECTIVE:                                                                                                                                                                                             SUBJECTIVE STATEMENT: I'm trying to go without my cane because I think it's making me more humpback.   Pt accompanied by: self  PERTINENT HISTORY: Breast CA s/p B mastectomy, CAD, cataract, depression, GERD, HLD, HTN, osteoporosis, CABG 2007, femoral-popliteal bypass graft 2024  PAIN:  Are you having pain? Yes: NPRS scale: no Pain location: B knee Pain description: achey Aggravating factors: stairs Relieving factors: mobic      PRECAUTIONS: Fall and Other: osteoporosis  RED FLAGS: None   WEIGHT BEARING RESTRICTIONS: No  FALLS: Has patient fallen in last 6 months? 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  PATIENT GOALS: work on endurance and improve balance   OBJECTIVE:     TODAY'S TREATMENT: 05/22/24 Activity Comments  Review of HEP: - Feet Together Balance at [employer] Eyes Closed - Seated Bilateral Elbow Flexion with Resistance  - Seated Elbow Extension with Self-Anchored Resistance  - Standing Shoulder Row with Anchored Resistance - Sit to Stand with Arms Crossed  - 1 x daily - 5 x weekly  - Side Stepping with Counter Support   - Standing Hip Abduction with Ankle Weight  - Standing Alternating Knee Flexion with Ankle Weights Provided demo, cueing for form, positioning, posture. TB exercises performed with yellow band. STS was performed from standard height chair with cueing for set up and technique   Vitals  149/51mmHg, 54 bpm      HOME EXERCISE PROGRAM Last updated: 05/15/24 Access Code: [email or web address] URL: [email or web address] Date: 05/15/2024 Prepared by: [facility] - Outpatient  Rehab - [facility]  Exercises - Feet Together Balance at Counter Top Eyes Closed  - 1 x daily - 7 x weekly - 3 sets - 30 sec hold - Seated Bilateral Elbow Flexion with Resistance on Swiss Ball  - 1 x daily - 3 x weekly - 2 sets - 10 reps - Seated Elbow Extension with Self-Anchored Resistance  - 1 x daily - 3 x weekly - 2 sets - 10 reps - Standing Shoulder Row with Anchored Resistance  - 1 x daily - 3 x weekly - 2 sets - 10 reps - Sit to Stand with Arms Crossed  - 1 x daily - 5 x weekly - 2 sets - 10 reps - Side Stepping with Counter Support  - 1 x daily - 7 x weekly - 3 sets - 1-2 min rounds  hold - Standing Hip Abduction with Ankle Weight  - 1 x daily - 5 x weekly - 2 sets - 10 reps - Standing Alternating Knee Flexion with Ankle Weights  - 1 x daily - 5 x weekly - 2 sets - 10 reps    PATIENT EDUCATION: Education details: DC and encouraged continued HEP performance to maintain fitness  Person educated: Patient Education method: Explanation, Demonstration, Tactile cues, and Verbal cues Education comprehension: verbalized understanding and returned demonstration     Note: Objective measures were completed at Evaluation unless otherwise noted.  Vitals: 98% spO2, 64bpm; 134/64 mmHg  DIAGNOSTIC FINDINGS: none recent  COGNITION: Overall cognitive status: Within functional limits for tasks assessed   SENSATION: Reports reduced sensation in B feet  POSTURE: rounded shoulders, forward head, and increased thoracic kyphosis  LOWER EXTREMITY MMT:    MMT (in sitting) Right Eval Left Eval  Hip flexion 3 3+  Hip extension    Hip abduction 3+ 3+  Hip adduction 3+ 3+  Hip internal rotation    Hip external rotation    Knee flexion 3+ 4-  Knee extension 3+ 4-  Ankle dorsiflexion 3 3  Ankle plantarflexion 2+ 2+  Ankle inversion    Ankle eversion    (Blank rows = not tested)  GAIT: Gait pattern: Very slow, short B step length, occasionally tripping on cane Assistive device utilized: Single point cane Level of assistance: CGA   FUNCTIONAL TESTS:  5  times sit to stand: 47.01 sec using B UEs  94% spO2 10 meter walk test: 43.89 sec with SPC 0.75 ft/sec                                                                                                                               TREATMENT DATE: 01/31/24      GOALS: Goals reviewed with patient? Yes  SHORT TERM GOALS: Target date: 02/21/2024  Patient to be independent with initial HEP. Baseline: HEP initiated; good recall Goal status: MET    LONG TERM GOALS: Target date: 05/23/2024  Patient to be independent  with advanced HEP. Baseline: Not yet initiated ; met for current 03/08/24 Goal status: MET 05/22/24  Patient to demonstrate B LE strength >/=4/5.  Baseline: See above; NT 03/08/24; MET 04/04/24 Goal status: MET 04/04/24  Patient to demonstrate gait speed of at least 1.5 ft/sec in order to improve access to community.  Baseline: 0.75 ft/sec; 1.4 ft/sec 03/08/24; 16.64 sec with cane(1.97 ft/sec) , 18.64 sec without cane (1.79 ft/sec) 04/04/24 Goal status: MET 04/04/24  Patient to demonstrate 5xSTS test in <25 sec in order to decrease risk of falls.  Baseline: 47.01 sec using B UEs; 19.29 sec with B UEs 03/08/24 Goal status: MET 03/08/24  Patient to demonstrate 5xSTS test in <25 sec without UEs in order to decrease risk of falls.  Baseline: 19.29 sec with B UEs 03/08/24; 31.27 sec without UE support 05/15/24  Goal status: IN PROGRESS 05/15/24   Patient to score at least 46/56 on Berg in order to decrease risk of falls.  Baseline: 41/56; 44/56 03/08/24; 49/56 04/04/24 Goal status: MET 04/04/24  Patient to report plans to participate in home or community exercise program to maintain fitness.  Baseline: not currently participating; unchanged 03/08/24; pt is looking into ACT 04/04/24; pt reports plans to continue with youtube videos and HEP 05/15/24 Goal status: IN PROGRESS 05/15/24  Patient to report 50% improvement in ability to open car door.  Baseline: reports difficulty d/t weakness 03/08/24; reports remaining difficulty 04/04/24; pt reports no change 05/15/24 Goal status: IN PROGRESS 05/15/24    ASSESSMENT:  CLINICAL IMPRESSION: Patient arrived to session without new complaints. Session today focused on detailed review and performance of HEP for max carryover. Patient required demo and cueing for form/technique. Goals were assessed last session and showed good progress towards goals. Patient is ready for DC at this time with transition to HEP or fitness classes.    OBJECTIVE IMPAIRMENTS: Abnormal gait,  decreased activity tolerance, decreased balance, decreased endurance, decreased knowledge of use of DME, decreased strength, decreased safety awareness, and postural dysfunction.   ACTIVITY LIMITATIONS: carrying, lifting, bending, standing, squatting, stairs, transfers, bathing, toileting, dressing, reach over head, hygiene/grooming, and locomotion level  PARTICIPATION LIMITATIONS: meal prep, cleaning, laundry, driving, shopping, community activity, and church  PERSONAL FACTORS: Age, Fitness, Past/current experiences, Time since onset of injury/illness/exacerbation, and 3+ comorbidities: Breast CA s/p B mastectomy, CAD, cataract,  depression, GERD, HLD, HTN, osteoporosis, CABG 2007, femoral-popliteal bypass graft 2024 are also affecting patient's functional outcome.   REHAB POTENTIAL: Good  CLINICAL DECISION MAKING: Evolving/moderate complexity  EVALUATION COMPLEXITY: Moderate  PLAN:  PT FREQUENCY: 2x/week  PT DURATION: 4 weeks  PLANNED INTERVENTIONS: [id number]- PT Re-evaluation, 97110-Therapeutic exercises, 97530- Therapeutic activity, 97112- Neuromuscular re-education, 97535- Self Care, [id number]- Manual therapy, [phone]- Gait training, [phone]- Canalith repositioning, Patient/Family education, Balance training, Stair training, Taping, Dry Needling, Joint mobilization, Spinal mobilization, Vestibular training, Cryotherapy, and Moist heat  PLAN FOR NEXT SESSION: DC at this time   PHYSICAL THERAPY DISCHARGE SUMMARY  Visits from Start of Care: 21  Current functional level related to goals / functional outcomes: See above clinical impression   Remaining deficits: Decreased transfer speed, difficulty opening car door   Education / Equipment: HEP, resources on fitness classes and videos  Plan: Patient agrees to discharge.  Patient goals were partially met. Patient is being discharged due to meeting the stated rehab goals.        Louana Terrilyn Christians, PT, DPT 05/22/24 4:57 PM  [facility] [address] [place], [address] [place], [place] [address] Phone # [phone] Fax # [phone]

## 2024-05-22 ENCOUNTER — Encounter: Payer: Self-pay | Admitting: Physical Therapy

## 2024-05-22 ENCOUNTER — Other Ambulatory Visit: Payer: Self-pay

## 2024-05-22 ENCOUNTER — Ambulatory Visit: Admitting: Physical Therapy

## 2024-05-22 DIAGNOSIS — Z952 Presence of prosthetic heart valve: Secondary | ICD-10-CM

## 2024-05-22 DIAGNOSIS — R29898 Other symptoms and signs involving the musculoskeletal system: Secondary | ICD-10-CM | POA: Diagnosis not present

## 2024-05-22 DIAGNOSIS — M6281 Muscle weakness (generalized): Secondary | ICD-10-CM | POA: Diagnosis not present

## 2024-05-22 DIAGNOSIS — R2681 Unsteadiness on feet: Secondary | ICD-10-CM

## 2024-05-22 DIAGNOSIS — R262 Difficulty in walking, not elsewhere classified: Secondary | ICD-10-CM

## 2024-05-22 DIAGNOSIS — R471 Dysarthria and anarthria: Secondary | ICD-10-CM | POA: Diagnosis not present

## 2024-05-22 DIAGNOSIS — R5381 Other malaise: Secondary | ICD-10-CM | POA: Diagnosis not present

## 2024-05-23 ENCOUNTER — Ambulatory Visit: Payer: Self-pay | Admitting: Neurology

## 2024-05-29 ENCOUNTER — Other Ambulatory Visit: Payer: Self-pay | Admitting: Internal Medicine

## 2024-06-09 ENCOUNTER — Ambulatory Visit
Admission: RE | Admit: 2024-06-09 | Discharge: 2024-06-09 | Disposition: A | Discharge status: Home / Self Care | Source: Ambulatory Visit | Attending: Neurology | Admitting: Neurology

## 2024-06-09 DIAGNOSIS — R531 Weakness: Secondary | ICD-10-CM | POA: Diagnosis not present

## 2024-06-09 DIAGNOSIS — R498 Other voice and resonance disorders: Secondary | ICD-10-CM | POA: Diagnosis not present

## 2024-06-09 MED ORDER — GADOPICLENOL 0.5 MMOL/ML IV SOLN
5.5000 mL | Freq: Once | INTRAVENOUS | Status: AC | PRN
  Administered 2024-06-09: 5.5 mL via INTRAVENOUS

## 2024-06-11 ENCOUNTER — Other Ambulatory Visit: Payer: Self-pay

## 2024-06-11 ENCOUNTER — Ambulatory Visit

## 2024-06-11 DIAGNOSIS — R5381 Other malaise: Secondary | ICD-10-CM

## 2024-06-11 DIAGNOSIS — R2681 Unsteadiness on feet: Secondary | ICD-10-CM | POA: Diagnosis not present

## 2024-06-11 DIAGNOSIS — R471 Dysarthria and anarthria: Secondary | ICD-10-CM

## 2024-06-11 DIAGNOSIS — M6281 Muscle weakness (generalized): Secondary | ICD-10-CM | POA: Diagnosis not present

## 2024-06-11 DIAGNOSIS — R29898 Other symptoms and signs involving the musculoskeletal system: Secondary | ICD-10-CM | POA: Diagnosis not present

## 2024-06-11 DIAGNOSIS — R262 Difficulty in walking, not elsewhere classified: Secondary | ICD-10-CM | POA: Diagnosis not present

## 2024-06-11 NOTE — Therapy (Signed)
 OUTPATIENT SPEECH LANGUAGE PATHOLOGY EVALUATION   Patient Name: [name] MRN: [id number] DOB:16-Aug-1941, 83 y.o., female Today's Date: 06/11/2024  PCP: Geofm Hastings, MD REFERRING PROVIDER: Same as PCP  END OF SESSION:  End of Session - 06/11/24 2258     Visit Number 1    Number of Visits 9    Date for SLP Re-Evaluation 08/10/24    SLP Start Time 1451    SLP Stop Time  1530    SLP Time Calculation (min) 39 min    Activity Tolerance Patient tolerated treatment well          Past Medical History:  Diagnosis Date   Allergy     SEASONAL   Anxiety    BREAST CANCER 1991   left, 1991; B mastectomy   CAD (coronary artery disease)    s/p CABG x5 in 2007   Cataract    BILATERAL-REMOVED   Depression    DIVERTICULOSIS    GERD    History of DVT (deep vein thrombosis)    HYPERLIPIDEMIA    Hypertension    HYPOTHYROIDISM    Occlusion and stenosis of carotid artery without mention of cerebral infarction    OSTEOPOROSIS    S/P TAVR (transcatheter aortic valve replacement) 06/21/2023   s/p TAVR with a 23mm Edwards S3UR via the TF approach by Dr. Verlin & Dr. Maryjane   Severe aortic stenosis    Past Surgical History:  Procedure Laterality Date   APPENDECTOMY     COLONOSCOPY     CORONARY ARTERY BYPASS GRAFT     5        2007   ENDARTERECTOMY FEMORAL Right 06/22/2023   Procedure: RIGHT FEMORAL ENDARTERECTOMY WITH PATCH ANGIOPLASTY;  Surgeon: Serene Gaile ORN, MD;  [facility];  Service: Vascular;  Laterality: Right;   FEMORAL-POPLITEAL BYPASS GRAFT Right 06/22/2023   Procedure: FEMORAL THROMBECTOMY;  Surgeon: Serene Gaile ORN, MD;  [facility];  Service: Vascular;  Laterality: Right;   INTRAOPERATIVE TRANSTHORACIC ECHOCARDIOGRAM N/A 06/21/2023   Procedure: INTRAOPERATIVE TRANSTHORACIC ECHOCARDIOGRAM;  Surgeon: Verlin Lonni BIRCH, MD;  [facility];  Service: Open Heart Surgery;  Laterality: N/A;   MASTECTOMY     left with reconstruction and lymph  node excision  1992   MASTECTOMY     right with reconstruction   PATCH ANGIOPLASTY Right 06/22/2023   Procedure: PATCH ANGIOPLASTY OF RIGHT FEMORAL ARTERY USING BOVINE PERICARDIAL PATCH;  Surgeon: Serene Gaile ORN, MD;  [facility];  Service: Vascular;  Laterality: Right;   REVISION RECONSTRUCTED BREAST Left 02/2014   willard (plastics in [place])   right ankle arthroscopy     RIGHT/LEFT HEART CATH AND CORONARY ANGIOGRAPHY N/A 06/09/2023   Procedure: RIGHT/LEFT HEART CATH AND CORONARY ANGIOGRAPHY;  Surgeon: Verlin Lonni BIRCH, MD;  [facility];  Service: Cardiovascular;  Laterality: N/A;   TEAR DUCT CANAL     TONSILLECTOMY AND ADENOIDECTOMY     TRANSCATHETER AORTIC VALVE REPLACEMENT, TRANSFEMORAL N/A 06/21/2023   Procedure: Transcatheter Aortic Valve Replacement, Transfemoral;  Surgeon: Verlin Lonni BIRCH, MD;  [facility];  Service: Open Heart Surgery;  Laterality: N/A;   UPPER GASTROINTESTINAL ENDOSCOPY     VAGINAL HYSTERECTOMY     ovaries not excised   Patient Active Problem List   Diagnosis Date Noted   Weakness of voice 03/16/2024   Bradykinesia 02/17/2024   Physical deconditioning 01/25/2024   Abnormal ultrasound 07/27/2023   Lung nodule seen on imaging study 07/27/2023  Fatigue 07/27/2023   Constipation 06/26/2023   S/P TAVR (transcatheter aortic valve replacement) 06/21/2023   CAD (coronary artery disease)    History of CVA (cerebrovascular accident) 10/27/2022   Bilateral sensorineural hearing loss 09/22/2022   Chronic diastolic heart failure (HCC) 08/04/2022   Prediabetes 08/04/2022   Acute left-sided low back pain without sciatica 07/30/2020   Cavus deformity of foot 05/01/2020   Poor balance 05/10/2018   Left ventricular diastolic dysfunction 07/27/2017   Essential hypertension 07/27/2017   Fall 03/02/2017   Urinary incontinence 02/16/2017   Chronic venous insufficiency 08/20/2016   Xiphoid pain 03/05/2016   Insomnia  01/16/2016   Anxiety and depression 12/24/2015   Asthma, mild intermittent, well-controlled 11/24/2015   Carotid artery disease (HCC) 06/29/2011   Hypothyroidism 11/11/2010   Hyperlipidemia 11/11/2010   Senile osteoporosis 11/11/2010   RHINOSINUSITIS, RECURRENT 10/29/2008   Personal history of malignant neoplasm of breast 03/27/2008   GERD (gastroesophageal reflux disease) 03/27/2008   DIVERTICULOSIS 03/27/2008   Atherosclerosis of native coronary artery of native heart with angina pectoris (HCC) 08/23/2007   Severe aortic stenosis 08/23/2007   DVT 08/23/2007    ONSET DATE: 2024 - script 04/04/24  REFERRING DIAG: hypophonia  THERAPY DIAG:  Dysarthria and anarthria  Physical deconditioning  Rationale for Evaluation and Treatment: Rehabilitation  SUBJECTIVE:   SUBJECTIVE STATEMENT: I told Slater I didn't think I needed therapy for my speech. (Pt, when SLP asked if she was having difficulty with her voice.) I'm concerned about - well not much, but my voice isn't one of them. (Pt, when SLP asked pt if she was concerned or bothered by her voice or her speech)  Pt accompanied by: self  PERTINENT HISTORY: Breast CA s/p B mastectomy, CAD, cataract, depression, GERD, HLD, HTN, osteoporosis, CABG 2007, femoral-popliteal bypass graft 2024    PAIN:  Are you having pain? No  FALLS: Has patient fallen in last 6 months?  See PT evaluation for details  LIVING ENVIRONMENT: Lives with: lives alone Lives in: House/apartment  PLOF:  Level of assistance: Independent with ADLs, Independent with IADLs Employment: Retired  PATIENT GOALS: I don't know why Slater referred me to you.  OBJECTIVE:  Note: Objective measures were completed at Evaluation unless otherwise noted.  DIAGNOSTIC FINDINGS:  MRI 06/11/24 IMPRESSION: 1. No acute or reversible finding. Age related volume loss without subjective lobar predominance. Moderate chronic small-vessel ischemic changes of the pons and  cerebral hemispheric white matter, minimally progressive since 2023 as would be expected. 2. Chronic opacification of the frontal sinuses left more than right. This could possibly relate to headache.  COGNITION: Overall cognitive status: Within functional limits for tasks assessed  MOTOR SPEECH: Overall motor speech: Appears intact; no dysarthric articulation noted.   ORAL MOTOR EXAMINATION: Overall status: WFL Comments: a bit sluggish but WNL ROM and WFL strength   OBJECTIVE VOICE ASSESSMENT: Sustained ah maximum phonation time: 9 seconds Sustained ah loudness average: 63 dB Oral reading (passage) loudness average: 63dB Oral reading loudness range: 57-66 dB Conversational loudness average: 63 dB Conversational loudness range: 55-65 dB Voice quality: breathy, low vocal intensity, and weak Stimulability trials: Given SLP modeling and usual max cues, loudness average increased to 64dB (range of 62 to 66) at imitated phrase level.  Comments: SLP attempted imitation of louder speech using SLP model, digital recorder to incr pt's awareness of her significantly lower voice volume but pt appeared unaffected. SLP found pt talks to family and friends approx 8-10 times a week and stated to SLP she  was not concerned that people ask her to repeat herself. Pt either was not compliant with SLP request to shout or was unable to do so and incr voice volume to a more WNL level.  Suspect pt with significantly decr'd breath support manifiesting itself in soft, squeaky voice 95% of the time. 5% of the time today in conversation pt had WNL voicing.   Pt does not report difficulty with swallowing which does not warrant further evaluation.  PATIENT REPORTED OUTCOME MEASURES (PROM): Communication Effectiveness Survey: provided in first session                                                                                                                            TREATMENT DATE:   06/11/24:  n/a  PATIENT EDUCATION: Education details: need for skilled ST to improve speech/voice loudness due to decr'd breath support Person educated: Patient Education method: Explanation, Demonstration, and Verbal cues Education comprehension: verbalized understanding, verbal cues required, and needs further education  HOME EXERCISE PROGRAM: To be provided.   GOALS: Goals reviewed with patient? Yes  SHORT TERM GOALS: Target date: 07/13/24  Pt will complete HEP at least 5 days/week for two weeks Baseline: Goal status: INITIAL  2.  Pt will demo abdominal breathing in rest 70% of the time in two sessions Baseline:  Goal status: INITIAL  3.  Pt will improve reading volume to 67dB for 30 second selections Baseline:  Goal status: INITIAL  Baseline:  Goal status: INITIAL  2.  Pt will demo abdominal breathing in sentence responses 70% of the time in two sessions Baseline:  Goal status: INITIAL  3.  Pt will improve conversational volume to 67dB for 3 consecutive responses in 2 sessions Baseline:  Goal status: INITIAL  4.  Pt will improve conversational volume to 67dB for 5 consecutive responses in 2 sessions Baseline:  Goal status: INITIAL  5.  Pt will demo abdominal breathing in 5 minutes conversation, 70% of the time, in two sessions Baseline:  Goal status: INITIAL   ASSESSMENT:  CLINICAL IMPRESSION: Patient is a [name] who was seen today for assessment of dysarthria c/b reduced voice volume. Pt appeared uninterested in a course of skilled ST for increasing voice volume even with SLP engaging pt in tasks to raise her awareness of severity of her deficit, and with direct questioning about making people ask her to repeat. SLP and pt agreed pt will think about skilled ST for 7 days and if she desires she will call to schedule ST once/week x8 weeks. If nothing scheduled in 7 calendar days SLP will d/c pt's chart for this course of ST.    OBJECTIVE IMPAIRMENTS: Objective  impairments include voice disorder. These impairments are limiting patient from managing appointments, managing finances, household responsibilities, ADLs/IADLs, and effectively communicating at home and in community.Factors affecting potential to achieve goals and functional outcome are cooperation/participation level and severity of impairments.. Patient will benefit from skilled SLP services to address  above impairments and improve overall function.  REHAB POTENTIAL: Fair given decr'd motivation  PLAN:  SLP FREQUENCY: 1x/week  SLP DURATION: 8 weeks  PLANNED INTERVENTIONS: Environmental controls, Internal/external aids, Functional tasks, SLP instruction and feedback, Compensatory strategies, Patient/family education, and [id number] Treatment of speech (30 or 45 min)     Haley Roza, CCC-SLP 06/11/2024, 10:59 PM

## 2024-06-19 ENCOUNTER — Encounter: Payer: Self-pay | Admitting: Internal Medicine

## 2024-06-19 NOTE — Patient Instructions (Addendum)
      Blood work was ordered.       Medications changes include :   None    A referral was ordered for ENT and someone will call you to schedule an appointment.     Return in about 6 months (around 12/21/2024) for follow up.

## 2024-06-19 NOTE — Progress Notes (Unsigned)
 Subjective:    Patient ID: [name], female    DOB: 1941-07-26, 83 y.o.   MRN: [id number]     HPI Foster is here for follow up of her chronic medical problems.  Still with hoarseness and generalized weakness..  Has seen neurology.  MRI of head done-shows sinus opacification.  Medications and allergies reviewed with patient and updated if appropriate.  Current Outpatient Medications on File Prior to Visit  Medication Sig Dispense Refill   alendronate  (FOSAMAX ) 70 MG tablet Take 1 tablet (70 mg total) by mouth every 7 (seven) days. Take with a full glass of water  on an empty stomach. 12 tablet 3   aspirin  81 MG tablet Take 81 mg by mouth daily.       atorvastatin  (LIPITOR) 20 MG tablet TAKE 1 TABLET BY MOUTH EVERY DAY 90 tablet 1   Calcium  Carbonate (CALTRATE 600 PO) Take 1 tablet by mouth 2 (two) times daily.       citalopram  (CELEXA ) 20 MG tablet TAKE 1 TABLET BY MOUTH EVERY DAY 90 tablet 2   co-enzyme Q-10 30 MG capsule Take 30 mg by mouth daily.     famotidine  (PEPCID ) 40 MG tablet TAKE 1 TABLET BY MOUTH EVERY DAY 90 tablet 1   fluticasone  (FLONASE ) 50 MCG/ACT nasal spray Place 1 spray into both nostrils daily as needed for allergies or rhinitis. 9.9 mL 5   furosemide  (LASIX ) 20 MG tablet Take 20 mg by mouth daily.     levothyroxine  (SYNTHROID ) 75 MCG tablet TAKE 1 TABLET BY MOUTH EVERY MORNING BEFORE BREAKFAST 90 tablet 1   losartan  (COZAAR ) 25 MG tablet Take 1 tablet (25 mg total) by mouth daily. (Patient taking differently: Take 25 mg by mouth daily. Take 12.5 mg daily) 90 tablet 3   meloxicam  (MOBIC ) 7.5 MG tablet TAKE 1/2 TO 1 TABLET BY MOUTH EVERY DAY 90 tablet 0   metoprolol  tartrate (LOPRESSOR ) 25 MG tablet TAKE 1 TABLET BY MOUTH TWICE A DAY 180 tablet 1   Multiple Vitamins-Minerals (MULTIVITAMIN) LIQD Take 1 tablet by mouth daily.     Multiple Vitamins-Minerals (PRESERVISION AREDS 2) CAPS Take 1 capsule by mouth 2 (two) times daily.     senna-docusate (SENOKOT-S)  8.6-50 MG per tablet Take 1 tablet by mouth daily.       VITAMIN D , CHOLECALCIFEROL, PO Take 1 tablet by mouth daily.     zolpidem  (AMBIEN ) 5 MG tablet TAKE 1 TABLET (5 MG TOTAL) BY MOUTH EVERY DAY AT BEDTIME AS NEEDED FOR SLEEP 30 tablet 3   No current facility-administered medications on file prior to visit.     Review of Systems  Constitutional:  Negative for fever.  HENT:  Negative for congestion, ear pain, sinus pressure, sinus pain and sore throat.   Respiratory:  Positive for cough (coughs up a little clear mucus in am) and shortness of breath (with talking, walking). Negative for wheezing.   Cardiovascular:  Positive for leg swelling (feet). Negative for chest pain and palpitations.  Neurological:  Negative for dizziness, light-headedness and headaches.       Objective:   Vitals:   06/20/24 1515  BP: 124/70  Pulse: 70  Temp: 98 F (36.7 C)  SpO2: 94%   BP Readings from Last 3 Encounters:  06/20/24 124/70  05/09/24 (!) 145/60  03/29/24 106/70   Wt Readings from Last 3 Encounters:  06/20/24 123 lb (55.8 kg)  05/09/24 123 lb 12.8 oz (56.2 kg)  03/29/24 117 lb  6.4 oz (53.3 kg)   Body mass index is 21.79 kg/m.    Physical Exam Constitutional:      General: She is not in acute distress.    Appearance: Normal appearance.  HENT:     Head: Normocephalic and atraumatic.  Eyes:     Conjunctiva/sclera: Conjunctivae normal.  Cardiovascular:     Rate and Rhythm: Normal rate and regular rhythm.     Heart sounds: Normal heart sounds.  Pulmonary:     Effort: Pulmonary effort is normal. No respiratory distress.     Breath sounds: Normal breath sounds. No wheezing.  Musculoskeletal:     Cervical back: Neck supple.     Right lower leg: Edema (trace) present.     Left lower leg: Edema (trace) present.  Lymphadenopathy:     Cervical: No cervical adenopathy.  Skin:    General: Skin is warm and dry.     Findings: No rash.  Neurological:     Mental Status: She is  alert. Mental status is at baseline.  Psychiatric:        Mood and Affect: Mood normal.        Behavior: Behavior normal.        Lab Results  Component Value Date   WBC 6.0 01/25/2024   HGB 12.2 01/25/2024   HCT 37.1 01/25/2024   PLT 246.0 01/25/2024   GLUCOSE 92 01/25/2024   CHOL 122 01/25/2024   TRIG 52.0 01/25/2024   HDL 54.70 01/25/2024   LDLCALC 57 01/25/2024   ALT 27 01/25/2024   AST 32 01/25/2024   NA 135 01/25/2024   K 4.3 01/25/2024   CL 100 01/25/2024   CREATININE 0.66 01/25/2024   BUN 16 01/25/2024   CO2 31 01/25/2024   TSH 0.80 01/25/2024   INR 1.1 06/17/2023   HGBA1C 6.0 01/25/2024   MR BRAIN W WO CONTRAST CLINICAL DATA:  Hypophonia.  Right-sided weakness.  EXAM: MRI HEAD WITHOUT AND WITH CONTRAST  TECHNIQUE: Multiplanar, multiecho pulse sequences of the brain and surrounding structures were obtained without and with intravenous contrast.  CONTRAST:  5.5 cc Vueway   COMPARISON:  10/25/2022  FINDINGS: Brain: Diffusion imaging does not show any acute or subacute infarction or other cause of restricted diffusion. Mild chronic small-vessel ischemic change affects the pons. Few old small vessel cerebellar infarctions. Cerebral hemispheres show age related volume loss without subjective lobar predominance. There are low moderate chronic small-vessel ischemic changes of the white matter, minimally progressive since 2023 as would be expected. No cortical or large vessel territory stroke. No mass, hemorrhage, hydrocephalus or extra-axial collection. Arachnoid herniation into the sella, presumed to represent a normal variant at this age.  Vascular: Major vessels at the base of the brain show flow.  Skull and upper cervical spine: Negative  Sinuses/Orbits: Chronic opacification of the frontal sinuses left more than right. No evidence of gross expansion or bone erosion. This could possibly relate to headache. Orbits negative  Other:  None  IMPRESSION: 1. No acute or reversible finding. Age related volume loss without subjective lobar predominance. Moderate chronic small-vessel ischemic changes of the pons and cerebral hemispheric white matter, minimally progressive since 2023 as would be expected. 2. Chronic opacification of the frontal sinuses left more than right. This could possibly relate to headache.  Electronically Signed   By: Oneil Officer M.D.   On: 06/11/2024 15:07    Assessment & Plan:    See Problem List for Assessment and Plan of chronic medical problems.

## 2024-06-20 ENCOUNTER — Ambulatory Visit: Admitting: Internal Medicine

## 2024-06-20 VITALS — BP 124/70 | HR 70 | Temp 98.0°F | Ht 63.0 in | Wt 123.0 lb

## 2024-06-20 DIAGNOSIS — E039 Hypothyroidism, unspecified: Secondary | ICD-10-CM | POA: Diagnosis not present

## 2024-06-20 DIAGNOSIS — E7849 Other hyperlipidemia: Secondary | ICD-10-CM

## 2024-06-20 DIAGNOSIS — G4709 Other insomnia: Secondary | ICD-10-CM | POA: Diagnosis not present

## 2024-06-20 DIAGNOSIS — K219 Gastro-esophageal reflux disease without esophagitis: Secondary | ICD-10-CM | POA: Diagnosis not present

## 2024-06-20 DIAGNOSIS — F32A Depression, unspecified: Secondary | ICD-10-CM

## 2024-06-20 DIAGNOSIS — F419 Anxiety disorder, unspecified: Secondary | ICD-10-CM

## 2024-06-20 DIAGNOSIS — R490 Dysphonia: Secondary | ICD-10-CM

## 2024-06-20 DIAGNOSIS — R930 Abnormal findings on diagnostic imaging of skull and head, not elsewhere classified: Secondary | ICD-10-CM | POA: Diagnosis not present

## 2024-06-20 DIAGNOSIS — I10 Essential (primary) hypertension: Secondary | ICD-10-CM

## 2024-06-20 DIAGNOSIS — R7303 Prediabetes: Secondary | ICD-10-CM

## 2024-06-20 LAB — LIPID PANEL
Cholesterol: 125 mg/dL (ref 0–200)
HDL: 52.1 mg/dL (ref >39.00)
LDL Cholesterol: 61 mg/dL (ref 0–99)
NonHDL: 72.53
Total CHOL/HDL Ratio: 2
Triglycerides: 58 mg/dL (ref 0.0–149.0)
VLDL: 11.6 mg/dL (ref 0.0–40.0)

## 2024-06-20 LAB — COMPREHENSIVE METABOLIC PANEL WITH GFR
ALT: 27 U/L (ref 0–35)
AST: 30 U/L (ref 0–37)
Albumin: 4.1 g/dL (ref 3.5–5.2)
Alkaline Phosphatase: 69 U/L (ref 39–117)
BUN: 19 mg/dL (ref 6–23)
CO2: 32 meq/L (ref 19–32)
Calcium: 9.4 mg/dL (ref 8.4–10.5)
Chloride: 102 meq/L (ref 96–112)
Creatinine, Ser: 0.64 mg/dL (ref 0.40–1.20)
GFR: 80.58 mL/min (ref >60.00)
Glucose, Bld: 99 mg/dL (ref 70–99)
Potassium: 4.1 meq/L (ref 3.5–5.1)
Sodium: 138 meq/L (ref 135–145)
Total Bilirubin: 0.9 mg/dL (ref 0.2–1.2)
Total Protein: 6.8 g/dL (ref 6.0–8.3)

## 2024-06-20 LAB — CBC WITH DIFFERENTIAL/PLATELET
Basophils Absolute: 0.1 K/uL (ref 0.0–0.1)
Basophils Relative: 0.9 % (ref 0.0–3.0)
Eosinophils Absolute: 0.2 K/uL (ref 0.0–0.7)
Eosinophils Relative: 4 % (ref 0.0–5.0)
HCT: 36 % (ref 36.0–46.0)
Hemoglobin: 11.9 g/dL — ABNORMAL LOW (ref 12.0–15.0)
Lymphocytes Relative: 31.4 % (ref 12.0–46.0)
Lymphs Abs: 1.9 K/uL (ref 0.7–4.0)
MCHC: 33 g/dL (ref 30.0–36.0)
MCV: 88.3 fl (ref 78.0–100.0)
Monocytes Absolute: 0.6 K/uL (ref 0.1–1.0)
Monocytes Relative: 10.2 % (ref 3.0–12.0)
Neutro Abs: 3.2 K/uL (ref 1.4–7.7)
Neutrophils Relative %: 53.5 % (ref 43.0–77.0)
Platelets: 217 K/uL (ref 150.0–400.0)
RBC: 4.08 Mil/uL (ref 3.87–5.11)
RDW: 15.4 % (ref 11.5–15.5)
WBC: 6 K/uL (ref 4.0–10.5)

## 2024-06-20 LAB — TSH: TSH: 1.76 u[IU]/mL (ref 0.35–5.50)

## 2024-06-20 LAB — HEMOGLOBIN A1C: Hgb A1c MFr Bld: 6 % (ref 4.6–6.5)

## 2024-06-20 NOTE — Assessment & Plan Note (Signed)
 Chronic Lab Results  Component Value Date   HGBA1C 6.0 06/20/2024   Check a1c Low sugar / carb diet Stressed regular exercise

## 2024-06-20 NOTE — Assessment & Plan Note (Signed)
 Chronic Blood pressure controlled Continue losartan  to 12.5 mg daily Continue metoprolol  25 mg twice daily

## 2024-06-20 NOTE — Assessment & Plan Note (Signed)
 MRI shows chronic opacification of frontal sinuses left > right No obvious infection, no headaches ? Require further evaluation or treatment Will refer to ENT

## 2024-06-20 NOTE — Assessment & Plan Note (Signed)
Chronic  Clinically euthyroid Last TSH within normal range Currently taking levothyroxine 75 mcg daily

## 2024-06-20 NOTE — Assessment & Plan Note (Signed)
 Chronic GERD controlled Continue Pepcid 40 mg daily Can consider trying to take the medication every other day and see how she does with that

## 2024-06-20 NOTE — Assessment & Plan Note (Signed)
 Subacute Has seen neuro Referral to ENT

## 2024-06-20 NOTE — Assessment & Plan Note (Signed)
Chronic Anxiety is controlled Continue citalopram to 20 mg daily

## 2024-06-20 NOTE — Assessment & Plan Note (Signed)
 Chronic Has been on Ambien  for years Currently taking half-one third of a pill at night to help her get to sleep She does state excessive fatigue and sleeping a lot Encouraged lowest dose as possible

## 2024-06-20 NOTE — Assessment & Plan Note (Signed)
 Chronic Healthy diet Check lipid panel Continue atorvastatin  20 mg daily

## 2024-06-21 ENCOUNTER — Ambulatory Visit: Payer: Self-pay | Admitting: Internal Medicine

## 2024-06-27 ENCOUNTER — Ambulatory Visit: Payer: Medicare Other

## 2024-06-27 ENCOUNTER — Other Ambulatory Visit (HOSPITAL_COMMUNITY): Payer: Medicare Other

## 2024-06-28 ENCOUNTER — Ambulatory Visit (INDEPENDENT_AMBULATORY_CARE_PROVIDER_SITE_OTHER): Admitting: Neurology

## 2024-06-28 DIAGNOSIS — R531 Weakness: Secondary | ICD-10-CM | POA: Diagnosis not present

## 2024-06-28 DIAGNOSIS — R498 Other voice and resonance disorders: Secondary | ICD-10-CM

## 2024-06-28 NOTE — Procedures (Signed)
 [facility]  [address], [address]  [place], [place] [address] Tel: [phone] Fax: [phone] Test Date:  06/28/2024  Patient: [name] DOB: 24-Jul-1941 Physician: Tonita Blanch, DO  Sex: Female Height: 5' 3 Ref Phys: Darice Shivers, MD  ID#: [id number]   Technician:    History: This is a 83 year old female referred for evaluation of generalized weakness.  NCV & EMG Findings: Extensive electrodiagnostic testing of the right upper and lower extremity as well as repetitive nerve stimulation of the right median and the peroneal nerves shows:  Right median and ulnar sensory responses are within normal limits.  Right sural and superficial peroneal sensory responses are absent. Right median motor response shows reduced amplitude, however there is evidence of a right Martin-Gruber anastomosis, a normal anatomic variant.  Right ulnar and tibial motor responses are within normal limits.  Right peroneal motor response is absent at the extensor digitorum brevis, and reduced at the tibialis anterior. Right tibial H reflex study is absent. Repetitive nerve stimulation at 3Hz  of the right median and peroneal nerve recording at the abductor pollicis brevis and tibialis anterior, respectively, is normal.  There is no evidence of abnormal decrement. There is a global pattern of incomplete motor unit recruitment as seen by variable muscle activation.  These findings may be due to pain, poor effort, or central disorder of motor unit control.  Mild chronic motor axon loss changes are seen affecting the right flexor digitorum longus and gastrocnemius muscles.  There is no evidence of accompanying active denervation.  Impression: The electrophysiologic findings are consistent with a chronic sensorimotor axonal polyneuropathy affecting the right lower extremity. There is a global pattern of incomplete motor unit recruitment as seen by variable muscle activation; which may be due to pain, poor  effort, or central disorder of motor unit control.   In particular, there is no evidence of a neuromuscular junction disorder, diffuse myopathy, or cervical/lumbosacral radiculopathy.   ___________________________ Tonita Blanch, DO    Nerve Conduction Studies   Stim Site NR Peak (ms) Norm Peak (ms) O-P Amp (V) Norm O-P Amp  Right Median Anti Sensory (2nd Digit)  32 C  Wrist    3.5 <3.8 18.9 >10  Right Sup Peroneal Anti Sensory (Ant Lat Mall)  32 C  12 cm *NR  <4.6  >3  Right Sural Anti Sensory (Lat Mall)  32 C  Calf *NR  <4.6  >3  Right Ulnar Anti Sensory (5th Digit)  32 C  Wrist    2.8 <3.2 15.8 >5     Stim Site NR Onset (ms) Norm Onset (ms) O-P Amp (mV) Norm O-P Amp Site1 Site2 Delta-0 (ms) Dist (cm) Vel (m/s) Norm Vel (m/s)  Right Median Motor (Abd Poll Brev)  32 C  Wrist    3.2 <4.0 *4.2 >5 Elbow Wrist 5.3 29.0 55 >50  Elbow    8.5  3.5  Ulnar-wrist crossover Elbow 3.6 0.0    Ulnar-wrist crossover    4.9  2.5         Right Peroneal Motor (Ext Dig Brev)  32 C  Ankle *NR  <6.0  >2.5 B Fib Ankle  0.0  >40  B Fib *NR     Poplt B Fib  0.0  >40  Poplt *NR            Right Peroneal TA Motor (Tib Ant)  32 C  Fib Head    3.4 <4.5 *2.5 >3 Poplit Fib Head 0.8 7.0 46 >40  Poplit    4.2 <5.7 2.4         Right Tibial Motor (Abd Hall Brev)  32 C  Ankle    3.0 <6.0 4.9 >4 Knee Ankle 9.0 43.0 48 >40  Knee    12.0  3.3         Right Ulnar Motor (Abd Dig Minimi)  32 C  Wrist    2.3 <3.1 7.5 >7 B Elbow Wrist 3.2 21.0 66 >50  B Elbow    5.5  6.7  A Elbow B Elbow 1.7 10.0 59 >50  A Elbow    7.2  6.4          Electromyography   Side Muscle Ins.Act Fibs Fasc Recrt Amp Dur Poly Activation Comment  Right 1stDorInt Nml Nml Nml *1- Nml Nml Nml *Variable N/A  Right Abd Poll Brev Nml Nml Nml *1- Nml Nml Nml *Variable N/A  Right PronatorTeres Nml Nml Nml *1- Nml Nml Nml *Variable N/A  Right Biceps Nml Nml Nml *1- Nml Nml Nml *Variable N/A  Right Triceps Nml Nml Nml *1- Nml Nml Nml  *Variable N/A  Right Deltoid Nml Nml Nml *1- Nml Nml Nml *Variable N/A  Right AntTibialis Nml Nml Nml *1- Nml Nml Nml *Variable N/A  Right Gastroc Nml Nml Nml *1- *1+ *1+ *1+ *Variable N/A  Right Flex Dig Long Nml Nml Nml *1- *1+ *1+ *1+ *Variable N/A  Right RectFemoris Nml Nml Nml *1- Nml Nml Nml *Variable N/A  Right BicepsFemS Nml Nml Nml *1- Nml Nml Nml *Variable N/A  Right GluteusMed Nml Nml Nml *1- Nml Nml Nml *Variable N/A      Waveforms:

## 2024-06-30 ENCOUNTER — Other Ambulatory Visit: Payer: Self-pay | Admitting: Internal Medicine

## 2024-07-05 ENCOUNTER — Ambulatory Visit: Payer: Self-pay | Admitting: Physician Assistant

## 2024-07-05 ENCOUNTER — Ambulatory Visit (INDEPENDENT_AMBULATORY_CARE_PROVIDER_SITE_OTHER): Admitting: Physician Assistant

## 2024-07-05 ENCOUNTER — Ambulatory Visit (HOSPITAL_COMMUNITY)
Admission: RE | Admit: 2024-07-05 | Discharge: 2024-07-05 | Disposition: A | Discharge status: Home / Self Care | Source: Ambulatory Visit | Attending: Cardiology | Admitting: Cardiology

## 2024-07-05 VITALS — BP 108/82 | HR 60 | Ht 65.0 in | Wt 117.3 lb

## 2024-07-05 DIAGNOSIS — I25119 Atherosclerotic heart disease of native coronary artery with unspecified angina pectoris: Secondary | ICD-10-CM | POA: Insufficient documentation

## 2024-07-05 DIAGNOSIS — Z952 Presence of prosthetic heart valve: Secondary | ICD-10-CM | POA: Insufficient documentation

## 2024-07-05 DIAGNOSIS — I10 Essential (primary) hypertension: Secondary | ICD-10-CM | POA: Insufficient documentation

## 2024-07-05 DIAGNOSIS — I5032 Chronic diastolic (congestive) heart failure: Secondary | ICD-10-CM | POA: Insufficient documentation

## 2024-07-05 LAB — ECHOCARDIOGRAM COMPLETE
AR max vel: 1.19 cm2
AV Area VTI: 1.1 cm2
AV Area mean vel: 1.26 cm2
AV Mean grad: 9 mmHg
AV Peak grad: 16.5 mmHg
Ao pk vel: 2.03 m/s
Area-P 1/2: 1.65 cm2
S' Lateral: 1.8 cm

## 2024-07-05 NOTE — Patient Instructions (Signed)
 Medication Instructions:  Your physician recommends that you continue on your current medications as directed. Please refer to the Current Medication list given to you today.  *If you need a refill on your cardiac medications before your next appointment, please call your pharmacy*  Lab Work: None needed If you have labs (blood work) drawn today and your tests are completely normal, you will receive your results only by: MyChart Message (if you have MyChart) OR A paper copy in the mail If you have any lab test that is abnormal or we need to change your treatment, we will call you to review the results.  Testing/Procedures: None needed  Follow-Up: At [facility], you and your health needs are our priority.  As part of our continuing mission to provide you with exceptional heart care, our providers are all part of one team.  This team includes your primary Cardiologist (physician) and Advanced Practice Providers or APPs (Physician Assistants and Nurse Practitioners) who all work together to provide you with the care you need, when you need it.  Your next appointment:   1 year(s)  Provider:   Ozell Fell, MD    We recommend signing up for the patient portal called MyChart.  Sign up information is provided on this After Visit Summary.  MyChart is used to connect with patients for Virtual Visits (Telemedicine).  Patients are able to view lab/test results, encounter notes, upcoming appointments, etc.  Non-urgent messages can be sent to your provider as well.   To learn more about what you can do with MyChart, go to [email or web address].

## 2024-07-05 NOTE — Progress Notes (Signed)
 [facility]   MULTIDISCIPLINARY HEART VALVE CLINIC                                     Cardiology Office Note:    Date:  07/05/2024   ID:  [name], DOB June 29, 1941, MRN [id number]  PCP:  Angelica Glade JINNY, MD  [facility] Cardiologist:  Ozell Fell, MD  [facility] Structural heart: None [facility] Electrophysiologist:  None   Referring MD: Angelica Glade JINNY, MD   1 year s/p TAVR   History of Present Illness:    [name] is a 83 y.o. female with a hx of coronary artery disease dating back to her initial presentation in 2007 when she required multivessel CABG. She developed aortic stenosis and underwent TAVR in August 2024, treated with a 20 mm SAPIEN 3 valve complicated by an ischemic right leg with need for surgical repair of an occluded right femoral artery. Her postoperative valve function has been normal.    Since her TAVR surgery she felt generally unwell and has undergone extensive work up by her neurologist, Dr. Georjean and PCP, Dr. Geofm with no clear explanation for her dramatic functional and cognitive decline.   The patient is here alone today.  She is not feeling well.  She continues to have problems with profound fatigue, changes in her legs, and somnolence.  She just does not have any energy despite getting > 10 hours of sleep. She lost her voice after TAVR and has weakness on right side. Feels like foot is deformed. She doe have some mild pedal edema. Lives alone in a small condo and has some help with cleaning. Sometimes her weakness is so profound, she cannot open the car door or open up a jar of food.   Past Medical History:  Diagnosis Date   Allergy     SEASONAL   Anxiety    BREAST CANCER 1991   left, 1991; B mastectomy   CAD (coronary artery disease)    s/p CABG x5 in 2007   Cataract    BILATERAL-REMOVED   Depression    DIVERTICULOSIS    GERD    History of DVT (deep vein thrombosis)    HYPERLIPIDEMIA    Hypertension     HYPOTHYROIDISM    Occlusion and stenosis of carotid artery without mention of cerebral infarction    OSTEOPOROSIS    S/P TAVR (transcatheter aortic valve replacement) 06/21/2023   s/p TAVR with a 23mm Edwards S3UR via the TF approach by Dr. Verlin & Dr. Maryjane   Severe aortic stenosis      Current Medications: Current Meds  Medication Sig   alendronate  (FOSAMAX ) 70 MG tablet Take 1 tablet (70 mg total) by mouth every 7 (seven) days. Take with a full glass of water  on an empty stomach.   aspirin  81 MG tablet Take 81 mg by mouth daily.     atorvastatin  (LIPITOR) 20 MG tablet TAKE 1 TABLET BY MOUTH EVERY DAY   Calcium  Carbonate (CALTRATE 600 PO) Take 1 tablet by mouth 2 (two) times daily.     citalopram  (CELEXA ) 20 MG tablet TAKE 1 TABLET BY MOUTH EVERY DAY   co-enzyme Q-10 30 MG capsule Take 30 mg by mouth daily.   famotidine  (PEPCID ) 40 MG tablet TAKE 1 TABLET BY MOUTH EVERY DAY   fluticasone  (FLONASE ) 50 MCG/ACT nasal spray Place 1 spray into both nostrils  daily as needed for allergies or rhinitis.   levothyroxine  (SYNTHROID ) 75 MCG tablet TAKE 1 TABLET BY MOUTH EVERY MORNING BEFORE BREAKFAST   losartan  (COZAAR ) 25 MG tablet Take 1 tablet (25 mg total) by mouth daily. (Patient taking differently: Take 25 mg by mouth daily. Take 12.5 mg daily)   meloxicam  (MOBIC ) 7.5 MG tablet TAKE 1/2 TO 1 TABLET BY MOUTH EVERY DAY   metoprolol  tartrate (LOPRESSOR ) 25 MG tablet TAKE 1 TABLET BY MOUTH TWICE A DAY   Multiple Vitamins-Minerals (MULTIVITAMIN) LIQD Take 1 tablet by mouth daily.   Multiple Vitamins-Minerals (PRESERVISION AREDS 2) CAPS Take 1 capsule by mouth 2 (two) times daily.   senna-docusate (SENOKOT-S) 8.6-50 MG per tablet Take 1 tablet by mouth daily.     VITAMIN D , CHOLECALCIFEROL, PO Take 1 tablet by mouth daily.   zolpidem  (AMBIEN ) 5 MG tablet TAKE 1 TABLET (5 MG TOTAL) BY MOUTH EVERY DAY AT BEDTIME AS NEEDED FOR SLEEP      ROS:   Please see the history of present illness.     All other systems reviewed and are negative.  EKGs       Risk Assessment/Calculations:           Physical Exam:    VS:  BP 108/82   Pulse 60   Ht 5' 5 (1.651 m)   Wt 117 lb 4.8 oz (53.2 kg)   BMI 19.52 kg/m     Wt Readings from Last 3 Encounters:  07/05/24 117 lb 4.8 oz (53.2 kg)  06/20/24 123 lb (55.8 kg)  05/09/24 123 lb 12.8 oz (56.2 kg)     GEN: Well nourished, well developed in no acute distress NECK: No JVD CARDIAC: 2/6 SEM heard best at LUSB. No rubs, gallops RESPIRATORY:  Clear to auscultation without rales, wheezing or rhonchi  ABDOMEN: Soft, non-tender, non-distended EXTREMITIES:  trace pedal edema; No deformity.    ASSESSMENT:    1. S/P TAVR (transcatheter aortic valve replacement)   2. Coronary artery disease involving native coronary artery of native heart with angina pectoris (HCC)   3. Chronic heart failure with preserved ejection fraction (HCC)   4. Essential hypertension     PLAN:    In order of problems listed above:  Severe AS s/p TAVR:  -- Echo today shows EF 55%, normally functioning TAVR with a mean gradient of 9 mm hg and no PVL as well as moderate pulmonic regurgitation. -- NYHA class III symptoms; mostly of fatigue and general malaise.  -- Continue Aspirin  81 mg daily.  -- She understands the ongoing need for SBE prophylaxis.  -- She will continue to follow with Dr. Wonda and get annual surveillance echos.   CAD: -- No chest pain.  -- Continue aspirin  81mg  daily and atorvastatin  20mg  daily.  -- Cholesterol followed by PCP. LDL 61 on 06/20/24  HFpEF: -- Appears euvolemic. -- Continue Lasix  20mg  daily.  -- CMP from 8/6 revealed normal kidney function and electrolytes (personally reviewed)  HTN: -- BP well controlled. -- Continue Lopressor  25mg  BID, Lasix  20mg  daily and losartan  25mg  daily.     Medication Adjustments/Labs and Tests Ordered: Current medicines are reviewed at length with the patient today.  Concerns  regarding medicines are outlined above.  No orders of the defined types were placed in this encounter.  No orders of the defined types were placed in this encounter.   Patient Instructions  Medication Instructions:  Your physician recommends that you continue on your current medications as directed. Please  refer to the Current Medication list given to you today.  *If you need a refill on your cardiac medications before your next appointment, please call your pharmacy*  Lab Work: None needed If you have labs (blood work) drawn today and your tests are completely normal, you will receive your results only by: MyChart Message (if you have MyChart) OR A paper copy in the mail If you have any lab test that is abnormal or we need to change your treatment, we will call you to review the results.  Testing/Procedures: None needed  Follow-Up: At [facility], you and your health needs are our priority.  As part of our continuing mission to provide you with exceptional heart care, our providers are all part of one team.  This team includes your primary Cardiologist (physician) and Advanced Practice Providers or APPs (Physician Assistants and Nurse Practitioners) who all work together to provide you with the care you need, when you need it.  Your next appointment:   1 year(s)  Provider:   Ozell Fell, MD    We recommend signing up for the patient portal called MyChart.  Sign up information is provided on this After Visit Summary.  MyChart is used to connect with patients for Virtual Visits (Telemedicine).  Patients are able to view lab/test results, encounter notes, upcoming appointments, etc.  Non-urgent messages can be sent to your provider as well.   To learn more about what you can do with MyChart, go to [email or web address].         Signed, Lamarr Hummer, PA-C  07/05/2024 11:58 PM    [facility]

## 2024-07-09 ENCOUNTER — Encounter: Payer: Self-pay | Admitting: Neurology

## 2024-07-09 ENCOUNTER — Ambulatory Visit (INDEPENDENT_AMBULATORY_CARE_PROVIDER_SITE_OTHER): Admitting: Neurology

## 2024-07-09 VITALS — BP 151/64 | HR 86 | Resp 20 | Ht 62.0 in | Wt 116.0 lb

## 2024-07-09 DIAGNOSIS — R258 Other abnormal involuntary movements: Secondary | ICD-10-CM

## 2024-07-09 DIAGNOSIS — R498 Other voice and resonance disorders: Secondary | ICD-10-CM

## 2024-07-09 NOTE — Progress Notes (Signed)
 NEUROLOGY FOLLOW UP OFFICE NOTE  [name] [id number] [birth year]  HISTORY OF PRESENT ILLNESS: I had the pleasure of seeing [name] in follow-up in the neurology clinic on 07/09/2024.  The patient was last seen 2 months ago when she presented with new symptoms of  fatigue and imbalance, marked bilateral LE weakness, decreased gait speed after TAVR surgery in 06/2023. She also reported micrographia and soft voice. No parkinsonian signs on last visit. She had a brain MRI with and without contrast in 05/2024 which I personally reviewed, no acute changes, there was mild chronic microvascular disease, few old small vessel cerebellar infarctions, diffuse volume loss. CK and myasthenia panel were negative. EMG/NCV of the right upper and lower extremity with repetitive nerve stimulation done 06/2024 showed a chronic sensorimotor axonal polyneuropathy affecting the right lower extremity, no evidence of a neuromuscular junction disorder, myopathy, or radiculopathy. There was note of a global pattern of incomplete motor unit recruitment as seen by variable muscle activation; which may be due to pain, poor effort, or central disorder of motor unit control.    Since her last visit, there has been no change in symptoms. She continues to report that she is not the same person. Her voice is still soft/hoarse, she gets out of breath when talking. No difficulty swallowing. She sees ENT on 9/8. She states everything just slowed me down. She has hand tremors/shakiness when waking up in the morning that resolves as the day progresses. She denies any anosmia or constipation. She has bilateral foot swelling, worse on the right. She states the right foot is turning inward. She cannot wear sandals. She denies any headaches, dizziness. She ambulates with a cane and reports a fall recently, no injuries. She sleeps all the time. She takes 1/3 of Ambien  5mg  and sleeps 12 hours, then still would fall asleep quickly/nap in the  day if she sits down.    History on Initial Assessment 10/28/2022: This is a very pleasant 83 year old right-handed woman with a history of hypertension, hyperlipidemia, hypothyroidism, CAD s/p CABG, moderate aortic stenosis, presenting for evaluation of auditory hallucinations. Review of records on EPIC done on visits with Cardiology, ENT, PCP. She was evaluated by ENT Dr. Carlie on 09/22/22 with note that about 10 days ago, she received news that her heart was not functioning well and got upset. She started Jardiance  on 10/16. Per notes,  a couple of days later, she was humming a hymn but then heard it continue after she stopped humming. She was sitting then all of a sudden heard the same hymn being sung right back to her on the right side. This lasted several minutes with no associated headache, dizziness, focal symptoms. Since then, the auditory hallucinations continued. She notes that they are different songs, church hymns, old hymns, now Christmas carols, sung by a different man or woman, or sometimes together. It would be the same line repeatedly for 30 minutes (go tell it to the mountain). Interestingly, the hymns stop when she is talking to someone, on the phone, or when she turns the music louder. She mostly notices it at home, but has heard it in her car. It stops when she turns the radio up. When she goes to another room where the TV is not audible, the hymns would resume. It appears she mostly notices it when she is alone. If she were with someone, even if they are not speaking, she does not hear the hymns. It stops before she goes to  sleep and does not wake her out of sleep. She mostly hears it on the right ear. Aside from the hymns, she does not hear any conversations, no visual component. Of note, Dr. Geofm reported that in March when she had a very stressful situation with increased anxiety, loss of appetite and insomnia, she was switched to Sertraline  and had similar auditory hallucinations  that seemed to stop after stopping Sertraline .   [name] denies any staring/unresponsive episodes. She denies any loss of time, olfactory/gustatory hallucinations, rising epigastric sensation, focal numbness/tingling/weakness, myoclonic jerks. No headaches, dizziness, diplopia, dysarthria/dysphagia, neck/back pain, bowel/bladder dysfunction. She had an unintentional 10-lb weight loss in a month, no appetite. She can tell a change in her memory, now she depends on her phone on how to spell a word or do math. [name] denies any memory concerns. She lives alone. She denies missing medications, [name] reports she is really good, with a good system at home. All her bills are on autopay. She denies getting lost driving, but can tell she is not as alert. She does not cook, no difficulties operating the microwave. No family history of dementia. She has shaking when nervous. She feels the increase in citalopram  to 20mg  has calmed her down. She was having panic attacks, there was a lot going on at the beginning of the year, little traumatic baby steps, but [name] has noticed a definite improvement in mood, there has been an upswing. She does not sleep well, anxiety increased since the hallucinations started, she was switched from Ambien  to Seroquel  25mg  which made her feel drunk and had bad dreams, with no change in sleep or in the hallucinations. She is back on prn Ambien  5mg  getting 5-6 hours of sleep. She used to get 10 hours of sleep. [name] notes they are working on getting new hearing aids. She had a normal birth and early development.  There is no history of febrile convulsions, CNS infections such as meningitis/encephalitis, significant traumatic brain injury, neurosurgical procedures, or family history of seizures.   Update 05/09/2024: She had TAVR surgery in 06/2023 but despite doing cardiac rehab, continued to have fatigue and imbalance, marked bilateral LE weakness, decreased gait speed and underwent outpatient PT.  On her PCP appointment in April 2025, she was reporting significant fatigue, softer weak voice, difficulty writing, poor balance, and being slower. She presents today for re-evaluation. She states all these symptoms started after the surgery. She states she does not feel like my whole self at all since the surgery. She has been tired all the time, if she sits in a quiet spot she could fall asleep anytime. She gets at least 10-11 hours of sleep taking 1/4 tablet of Ambien . She reports having no energy that she could not even open the door to our office. She cannot write with her right hand, she used to write a lot in cursive but now handwriting is smaller. She has lost her voice, it is not worse as the day progresses. Sometimes she has problems swallowing, no choking. No diplopia, she saw her eye doctor and has decreased vision on the right eye due to dry eye. She reported her head feeling funny when she gets up in the morning. She states she is very nervous when she gets up in the morning, there is no clear reason, she is shaky later in the day around 3-4 pm but the anxiety gets better. It feels like when you reboot on a computer.   She has been having pain in the  right foot, it is tender all the time. There is no burning or paresthesias. She cannot wear the right shoe, she has to keep a gauze over the dorsum of her right foot and cuts the top of her sock. The foot is tender all the time that her podiatrist cannot touch it. She reports her foot is black in the morning and is very red right now. She reports there was a complication during surgery and she almost lost my right leg, but since then she has seen Vascular and circulation is okay.  She has 7 steps in her townhouse and has difficulty with them. She is waiting for a spot at [facility]. She has gotten dependent on her cane, afraid she will fall. Prior to surgery, she did not need any assistive devices. She denies any falls. She has also become  incontinent of urine since the surgery, medications have not helped. She wears pull-ups at night but can hold it during the day (does not urinate much). No bowel dysfunction. She has back pain. She is driving.   I personally reviewed MRI brain without contrast done 10/26/22 with no acute changes, there was mild to moderate diffuse atrophy, mild chronic microvascular disease. There was complete opacification of the left frontal sinus and incompletely assessed cervical spondylosis with multilevel grade 1 spondylolisthesis.    PAST MEDICAL HISTORY: Past Medical History:  Diagnosis Date   Allergy     SEASONAL   Anxiety    BREAST CANCER 1991   left, 1991; B mastectomy   CAD (coronary artery disease)    s/p CABG x5 in 2007   Cataract    BILATERAL-REMOVED   Depression    DIVERTICULOSIS    GERD    History of DVT (deep vein thrombosis)    HYPERLIPIDEMIA    Hypertension    HYPOTHYROIDISM    Occlusion and stenosis of carotid artery without mention of cerebral infarction    OSTEOPOROSIS    S/P TAVR (transcatheter aortic valve replacement) 06/21/2023   s/p TAVR with a 23mm Edwards S3UR via the TF approach by Dr. Verlin & Dr. Maryjane   Severe aortic stenosis     MEDICATIONS: Current Outpatient Medications on File Prior to Visit  Medication Sig Dispense Refill   alendronate  (FOSAMAX ) 70 MG tablet Take 1 tablet (70 mg total) by mouth every 7 (seven) days. Take with a full glass of water  on an empty stomach. 12 tablet 3   aspirin  81 MG tablet Take 81 mg by mouth daily.       atorvastatin  (LIPITOR) 20 MG tablet TAKE 1 TABLET BY MOUTH EVERY DAY 90 tablet 1   Calcium  Carbonate (CALTRATE 600 PO) Take 1 tablet by mouth 2 (two) times daily.       citalopram  (CELEXA ) 20 MG tablet TAKE 1 TABLET BY MOUTH EVERY DAY 90 tablet 2   co-enzyme Q-10 30 MG capsule Take 30 mg by mouth daily.     famotidine  (PEPCID ) 40 MG tablet TAKE 1 TABLET BY MOUTH EVERY DAY 90 tablet 1   fluticasone  (FLONASE ) 50 MCG/ACT  nasal spray Place 1 spray into both nostrils daily as needed for allergies or rhinitis. 9.9 mL 5   levothyroxine  (SYNTHROID ) 75 MCG tablet TAKE 1 TABLET BY MOUTH EVERY MORNING BEFORE BREAKFAST 90 tablet 1   losartan  (COZAAR ) 25 MG tablet Take 1 tablet (25 mg total) by mouth daily. 90 tablet 3   meloxicam  (MOBIC ) 7.5 MG tablet TAKE 1/2 TO 1 TABLET BY MOUTH EVERY DAY 90 tablet  0   metoprolol  tartrate (LOPRESSOR ) 25 MG tablet TAKE 1 TABLET BY MOUTH TWICE A DAY 180 tablet 1   Multiple Vitamins-Minerals (MULTIVITAMIN) LIQD Take 1 tablet by mouth daily.     senna-docusate (SENOKOT-S) 8.6-50 MG per tablet Take 1 tablet by mouth daily.       VITAMIN D , CHOLECALCIFEROL, PO Take 1 tablet by mouth daily.     zolpidem  (AMBIEN ) 5 MG tablet TAKE 1 TABLET (5 MG TOTAL) BY MOUTH EVERY DAY AT BEDTIME AS NEEDED FOR SLEEP 30 tablet 3   furosemide  (LASIX ) 20 MG tablet Take 20 mg by mouth daily. (Patient not taking: Reported on 07/09/2024)     Multiple Vitamins-Minerals (PRESERVISION AREDS 2) CAPS Take 1 capsule by mouth 2 (two) times daily. (Patient not taking: Reported on 07/09/2024)     No current facility-administered medications on file prior to visit.    ALLERGIES: Allergies  Allergen Reactions   Clarithromycin Other (See Comments)    PATIENT UNSURE OF REACTION   Clindamycin Other (See Comments)   Codeine     REACTION: nausea   Levofloxacin     REACTION: nausea   Morphine Other (See Comments)    Nausea  Nausea    Naproxen     REACTION: nausea   Omeprazole  Other (See Comments)    Made her jittery, made her feel weird, ?rash   Penicillins Other (See Comments) and Rash    REACTION: RASH REACTION: RASH    Pneumococcal Vaccines Rash    Small rash on arm at injection site.     FAMILY HISTORY: Family History  Problem Relation Age of Onset   Colon cancer Sister    Colon cancer Sister    Breast cancer Mother    Lung disease Mother    Emphysema Father    Esophageal cancer Neg Hx    Rectal  cancer Neg Hx    Stomach cancer Neg Hx    Thyroid  disease Neg Hx     SOCIAL HISTORY: Social History   Socioeconomic History   Marital status: Divorced    Spouse name: Not on file   Number of children: 2   Years of education: Not on file   Highest education level: Not on file  Occupational History   Occupation: retired  Tobacco Use   Smoking status: Never   Smokeless tobacco: Never  Vaping Use   Vaping status: Never Used  Substance and Sexual Activity   Alcohol use: No    Alcohol/week: 0.0 standard drinks of alcohol   Drug use: No   Sexual activity: Not Currently  Other Topics Concern   Not on file  Social History Narrative   Divorced x 3, lives alone   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, school psychologist   Right handed    Social Drivers of Health   Financial Resource Strain: Low Risk  (01/23/2024)   Overall Financial Resource Strain (CARDIA)    Difficulty of Paying Living Expenses: Not hard at all  Food Insecurity: No Food Insecurity (01/23/2024)   Hunger Vital Sign    Worried About Running Out of Food in the Last Year: Never true    Ran Out of Food in the Last Year: Never true  Transportation Needs: No Transportation Needs (01/23/2024)   PRAPARE - Administrator, Civil Service (Medical): No    Lack of Transportation (Non-Medical): No  Physical Activity: Inactive (01/23/2024)   Exercise Vital Sign    Days of Exercise per Week: 0  days    Minutes of Exercise per Session: 0 min  Stress: Stress Concern Present (01/23/2024)   [employer] of Occupational Health - Occupational Stress Questionnaire    Feeling of Stress : To some extent  Social Connections: Moderately Integrated (01/23/2024)   Social Connection and Isolation Panel    Frequency of Communication with Friends and Family: More than three times a week    Frequency of Social Gatherings with Friends and Family: More than three times a week    Attends Religious Services: More than 4  times per year    Active Member of [employer] or Organizations: Yes    Attends Banker Meetings: Never    Marital Status: Divorced  Catering manager Violence: Not At Risk (06/25/2023)   Humiliation, Afraid, Rape, and Kick questionnaire    Fear of Current or Ex-Partner: No    Emotionally Abused: No    Physically Abused: No    Sexually Abused: No     PHYSICAL EXAM: Vitals:   07/09/24 0857  BP: (!) 151/64  Pulse: 86  Resp: 20  SpO2: 95%   General: No acute distress. Breathy weak voice, getting out of breath with longer sentences Head:  Normocephalic/atraumatic Skin/Extremities: +right pedal edema Neurological Exam: alert and awake. No aphasia or dysarthria. Fund of knowledge is appropriate.  Attention and concentration are normal.   Cranial nerves: Pupils equal, round. ?possible upgaze restriction on EOM testing. Visual fields full.  No facial asymmetry.  Motor: +cogwheeling on right, muscle strength 5/5 throughout with no pronator drift.   Finger to nose testing intact.  Gait slow and cautious with cane, right foot slightly turned out. No tremors. Decreased finger taps, good foot taps and RAMs. +postural instability   IMPRESSION: This is a very pleasant 83 yo RH woman with a history of hypertension, hyperlipidemia, hypothyroidism, CAD s/p CABG, moderate aortic stenosis, iniitally seen for auditory hallucinations that seemed to improve with hearing aids. She presented in June 2025 for new symptoms that she reported started after TAVR surgery in 06/2023. She reported fatigue, lower extremity weakness, voice changes, changes in handwriting. MRI brain no acute changes. EMG/NCV did not show any evidence of myopathy or neuromuscular junction disorder. Etiology of symptoms remain unclear, she continues to have hypophonia, reports bradykinesia, with some cogwheeling on the right and postural instability noted today. We discussed doing a DaTscan to assess for parkinsonian pathology. Proceed  with ENT evaluation as planned. Continue with home exercises. Follow-up after testing, call for any changes.  Thank you for allowing me to participate in her care.  Please do not hesitate to call for any questions or concerns.    Darice Shivers, M.D.   CC: Dr. Geofm

## 2024-07-09 NOTE — Patient Instructions (Signed)
 Good to see you.  Schedule DaTscan  2. Proceed with ENT evaluation as scheduled  3. Continue home exercises  4. Follow-up after test, our office will call to schedule follow-up. Call for any changes

## 2024-07-13 ENCOUNTER — Telehealth: Payer: Self-pay

## 2024-07-13 NOTE — Telephone Encounter (Signed)
 error

## 2024-07-13 NOTE — Telephone Encounter (Signed)
 Bascom Cleveland at [facility] asked if there were any meds to hold before Data Scan, celexa  is the medicine to be held ofr 24 hours, I left message to call office to discuss medication hold.

## 2024-07-17 ENCOUNTER — Telehealth: Payer: Self-pay | Admitting: Neurology

## 2024-07-17 NOTE — Telephone Encounter (Signed)
 Pt. Calling back, but no notes on who called

## 2024-07-17 NOTE — Telephone Encounter (Signed)
 I advised to hold Celexa  per Protocol patient to call if any questions, verbally understood and thanked me to hold celexa  24 hours per DatScan.

## 2024-07-19 ENCOUNTER — Telehealth: Payer: Self-pay

## 2024-07-19 NOTE — Telephone Encounter (Signed)
 Spoke with patient today.

## 2024-07-19 NOTE — Telephone Encounter (Signed)
 I do not know if she needs iron or not -- we can check her iron levels at her next visit.

## 2024-07-19 NOTE — Telephone Encounter (Signed)
 Copied from CRM [id number]. Topic: Clinical - Medical Advice >> Jul 19, 2024 10:25 AM Thersia BROCKS wrote: Reason for CRM: Patient called in regarding a message for Tobias, wanted to know if it is okay to start taking a iron pill. Would like a callback

## 2024-07-20 ENCOUNTER — Telehealth: Payer: Self-pay | Admitting: Physician Assistant

## 2024-07-20 NOTE — Telephone Encounter (Signed)
 Pt called stating she is having a severe headache and has no history of headache. She has chronic right sided weakness since TAVR that is being evaluated by neurology. She has taken tylenol . I advised ER evaluation since she has no history of headache and weakness - can't determine new symptoms.   She expressed understanding.   By the time we ended our call, she stated the headache was improving.

## 2024-07-23 ENCOUNTER — Institutional Professional Consult (permissible substitution) (INDEPENDENT_AMBULATORY_CARE_PROVIDER_SITE_OTHER)

## 2024-07-31 ENCOUNTER — Ambulatory Visit: Admitting: Internal Medicine

## 2024-08-08 ENCOUNTER — Encounter (HOSPITAL_COMMUNITY)
Admission: RE | Admit: 2024-08-08 | Discharge: 2024-08-08 | Disposition: A | Discharge status: Home / Self Care | Source: Ambulatory Visit | Attending: Neurology | Admitting: Neurology

## 2024-08-08 ENCOUNTER — Encounter (HOSPITAL_COMMUNITY): Payer: Self-pay

## 2024-08-08 DIAGNOSIS — R498 Other voice and resonance disorders: Secondary | ICD-10-CM | POA: Diagnosis not present

## 2024-08-08 DIAGNOSIS — R258 Other abnormal involuntary movements: Secondary | ICD-10-CM | POA: Insufficient documentation

## 2024-08-08 MED ORDER — POTASSIUM IODIDE (ANTIDOTE) 130 MG PO TABS
ORAL_TABLET | ORAL | Status: AC
  Filled 2024-08-08: qty 1

## 2024-08-08 MED ORDER — IOFLUPANE I 123 185 MBQ/2.5ML IV SOLN
5.0000 | Freq: Once | INTRAVENOUS | Status: AC | PRN
  Administered 2024-08-08: 4.08 via INTRAVENOUS

## 2024-08-09 ENCOUNTER — Ambulatory Visit: Payer: Self-pay | Admitting: Neurology

## 2024-08-10 ENCOUNTER — Other Ambulatory Visit: Payer: Self-pay | Admitting: Internal Medicine

## 2024-08-15 ENCOUNTER — Telehealth: Payer: Self-pay | Admitting: Lab

## 2024-08-15 NOTE — Telephone Encounter (Signed)
 Patient has called and has asked for a change in providers has been a patient of Dr.Regal for a very long time and is having serious concerns about her care and now would like to see another provider to take on her care moving forward. Would like to speak to someone in charge that will take her concerns serious.

## 2024-08-16 NOTE — Telephone Encounter (Signed)
 Error

## 2024-08-21 ENCOUNTER — Telehealth: Payer: Self-pay | Admitting: Podiatry

## 2024-08-21 NOTE — Telephone Encounter (Signed)
 When Pt. Call back please transfer call to me thanks.

## 2024-08-31 ENCOUNTER — Ambulatory Visit (INDEPENDENT_AMBULATORY_CARE_PROVIDER_SITE_OTHER)

## 2024-08-31 ENCOUNTER — Encounter (INDEPENDENT_AMBULATORY_CARE_PROVIDER_SITE_OTHER): Payer: Self-pay

## 2024-08-31 VITALS — BP 135/74 | HR 55 | Temp 97.7°F | Ht 64.0 in | Wt 115.0 lb

## 2024-08-31 DIAGNOSIS — J387 Other diseases of larynx: Secondary | ICD-10-CM

## 2024-08-31 DIAGNOSIS — R490 Dysphonia: Secondary | ICD-10-CM

## 2024-08-31 DIAGNOSIS — J310 Chronic rhinitis: Secondary | ICD-10-CM

## 2024-08-31 DIAGNOSIS — R0982 Postnasal drip: Secondary | ICD-10-CM

## 2024-08-31 DIAGNOSIS — J019 Acute sinusitis, unspecified: Secondary | ICD-10-CM

## 2024-08-31 DIAGNOSIS — R519 Headache, unspecified: Secondary | ICD-10-CM | POA: Diagnosis not present

## 2024-08-31 DIAGNOSIS — J321 Chronic frontal sinusitis: Secondary | ICD-10-CM

## 2024-08-31 DIAGNOSIS — J322 Chronic ethmoidal sinusitis: Secondary | ICD-10-CM

## 2024-08-31 MED ORDER — DOXYCYCLINE HYCLATE 100 MG PO TABS: 100.0000 mg | ORAL_TABLET | Freq: Two times a day (BID) | ORAL | 0 refills | Status: AC

## 2024-08-31 MED ORDER — IPRATROPIUM BROMIDE 0.03 % NA SOLN: 2.0000 | Freq: Three times a day (TID) | NASAL | 12 refills | Status: AC | PRN

## 2024-09-01 ENCOUNTER — Other Ambulatory Visit: Payer: Self-pay | Admitting: Internal Medicine

## 2024-09-01 NOTE — Progress Notes (Signed)
 Dear Dr. Geofm, Here is my assessment for our mutual patient, [name]. Thank you for allowing me the opportunity to care for your patient. Please do not hesitate to contact me should you have any other questions. Sincerely, Dr. Penne Croak  Otolaryngology Clinic Note Referring provider: Dr. Geofm HPI:  Discussed the use of AI scribe software for clinical note transcription with the patient, who gave verbal consent to proceed.  History of Present Illness [name] is an 83 year old female who presents with chronic headaches and sinus issues.  Cephalgia (headache) - Diffuse, constant headaches for approximately two weeks - No identifiable triggers - Praying provides some relief - No specific aggravating factors identified  Chronic rhinorrhea and sinus symptoms - Constantly runny nose bilaterally - Excessive tearing from both eyes - History of sinus infection diagnosed in July following MRI - Nasal discharge increases with consumption of hot foods or beverages - Previously treated with antibiotics, including a course prior to a dental procedure - Currently taking a half dose of allergy  medication without perceived benefit  Dysphonia (voice changes) - Chronic voice issues with reduced volume, often too quiet for others to hear - Previously referred to speech therapy, which was unhelpful due to focus on shouting exercises without improvement. Attended single session.  Aural symptoms - History of hearing aid use - Suspected cerumen impaction in the right ear, described as a recurring issue  Independent Review of Additional Tests or Records:  Reviewed external note from referring PCP, Burns,describing RElevant history incorporated into today's evaluation. I personally reviewed and interpreted brain MRI w and wo contrast dated July 2025 - bilateral frontal sinusitis L>R and left ethmoid sinusitis. Moderate worsening compared to  CT head from 2023.    PMH/Meds/All/SocHx/FamHx/ROS:   Past Medical History:  Diagnosis Date   Allergy     SEASONAL   Anxiety    BREAST CANCER 1991   left, 1991; B mastectomy   CAD (coronary artery disease)    s/p CABG x5 in 2007   Cataract    BILATERAL-REMOVED   Depression    DIVERTICULOSIS    GERD    History of DVT (deep vein thrombosis)    HYPERLIPIDEMIA    Hypertension    HYPOTHYROIDISM    Occlusion and stenosis of carotid artery without mention of cerebral infarction    OSTEOPOROSIS    S/P TAVR (transcatheter aortic valve replacement) 06/21/2023   s/p TAVR with a 23mm Edwards S3UR via the TF approach by Dr. Verlin & Dr. Maryjane   Severe aortic stenosis      Past Surgical History:  Procedure Laterality Date   APPENDECTOMY     COLONOSCOPY     CORONARY ARTERY BYPASS GRAFT     5        2007   ENDARTERECTOMY FEMORAL Right 06/22/2023   Procedure: RIGHT FEMORAL ENDARTERECTOMY WITH PATCH ANGIOPLASTY;  Surgeon: Serene Gaile ORN, MD;  Location: [facility] OR;  Service: Vascular;  Laterality: Right;   FEMORAL-POPLITEAL BYPASS GRAFT Right 06/22/2023   Procedure: FEMORAL THROMBECTOMY;  Surgeon: Serene Gaile ORN, MD;  Location: [facility] OR;  Service: Vascular;  Laterality: Right;   INTRAOPERATIVE TRANSTHORACIC ECHOCARDIOGRAM N/A 06/21/2023   Procedure: INTRAOPERATIVE TRANSTHORACIC ECHOCARDIOGRAM;  Surgeon: Verlin Lonni BIRCH, MD;  Location: [facility];  Service: Open Heart Surgery;  Laterality: N/A;   MASTECTOMY     left with reconstruction and lymph node excision  1992   MASTECTOMY     right with reconstruction   PATCH ANGIOPLASTY Right 06/22/2023  Procedure: PATCH ANGIOPLASTY OF RIGHT FEMORAL ARTERY USING BOVINE PERICARDIAL PATCH;  Surgeon: Serene Gaile ORN, MD;  Location: [facility];  Service: Vascular;  Laterality: Right;   REVISION RECONSTRUCTED BREAST Left 02/2014   willard (plastics in [place])   right ankle arthroscopy     RIGHT/LEFT HEART CATH AND CORONARY ANGIOGRAPHY N/A 06/09/2023   Procedure:  RIGHT/LEFT HEART CATH AND CORONARY ANGIOGRAPHY;  Surgeon: Verlin Lonni BIRCH, MD;  [facility]: [facility];  Service: Cardiovascular;  Laterality: N/A;   TEAR DUCT CANAL     TONSILLECTOMY AND ADENOIDECTOMY     TRANSCATHETER AORTIC VALVE REPLACEMENT, TRANSFEMORAL N/A 06/21/2023   Procedure: Transcatheter Aortic Valve Replacement, Transfemoral;  Surgeon: Verlin Lonni BIRCH, MD;  Location: [facility];  Service: Open Heart Surgery;  Laterality: N/A;   UPPER GASTROINTESTINAL ENDOSCOPY     VAGINAL HYSTERECTOMY     ovaries not excised    Family History  Problem Relation Age of Onset   Colon cancer Sister    Colon cancer Sister    Breast cancer Mother    Lung disease Mother    Emphysema Father    Esophageal cancer Neg Hx    Rectal cancer Neg Hx    Stomach cancer Neg Hx    Thyroid  disease Neg Hx      Social Connections: Moderately Integrated (01/23/2024)   Social Connection and Isolation Panel    Frequency of Communication with Friends and Family: More than three times a week    Frequency of Social Gatherings with Friends and Family: More than three times a week    Attends Religious Services: More than 4 times per year    Active Member of [employer] or Organizations: Yes    Attends Banker Meetings: Never    Marital Status: Divorced      Current Outpatient Medications:    doxycycline  (VIBRA -TABS) 100 MG tablet, Take 1 tablet (100 mg total) by mouth 2 (two) times daily before lunch and supper for 7 days., Disp: 14 tablet, Rfl: 0   ipratropium (ATROVENT) 0.03 % nasal spray, Place 2 sprays into both nostrils 3 (three) times daily with meals as needed for rhinitis., Disp: 30 mL, Rfl: 12   alendronate  (FOSAMAX ) 70 MG tablet, Take 1 tablet (70 mg total) by mouth every 7 (seven) days. Take with a full glass of water  on an empty stomach., Disp: 12 tablet, Rfl: 3   aspirin  81 MG tablet, Take 81 mg by mouth daily.  , Disp: , Rfl:    atorvastatin  (LIPITOR) 20 MG tablet,  TAKE 1 TABLET BY MOUTH EVERY DAY, Disp: 90 tablet, Rfl: 1   Calcium  Carbonate (CALTRATE 600 PO), Take 1 tablet by mouth 2 (two) times daily.  , Disp: , Rfl:    citalopram  (CELEXA ) 20 MG tablet, TAKE 1 TABLET BY MOUTH EVERY DAY, Disp: 90 tablet, Rfl: 2   co-enzyme Q-10 30 MG capsule, Take 30 mg by mouth daily., Disp: , Rfl:    famotidine  (PEPCID ) 40 MG tablet, TAKE 1 TABLET BY MOUTH EVERY DAY, Disp: 90 tablet, Rfl: 1   fluticasone  (FLONASE ) 50 MCG/ACT nasal spray, Place 1 spray into both nostrils daily as needed for allergies or rhinitis., Disp: 9.9 mL, Rfl: 5   furosemide  (LASIX ) 20 MG tablet, Take 20 mg by mouth daily. (Patient not taking: Reported on 07/09/2024), Disp: , Rfl:    levothyroxine  (SYNTHROID ) 75 MCG tablet, TAKE 1 TABLET BY MOUTH EVERY MORNING BEFORE BREAKFAST, Disp: 90 tablet, Rfl: 1   losartan  (  COZAAR ) 25 MG tablet, Take 1 tablet (25 mg total) by mouth daily., Disp: 90 tablet, Rfl: 3   meloxicam  (MOBIC ) 7.5 MG tablet, TAKE 1/2 TO 1 TABLET BY MOUTH EVERY DAY, Disp: 90 tablet, Rfl: 0   metoprolol  tartrate (LOPRESSOR ) 25 MG tablet, TAKE 1 TABLET BY MOUTH TWICE A DAY, Disp: 180 tablet, Rfl: 1   Multiple Vitamins-Minerals (MULTIVITAMIN) LIQD, Take 1 tablet by mouth daily., Disp: , Rfl:    Multiple Vitamins-Minerals (PRESERVISION AREDS 2) CAPS, Take 1 capsule by mouth 2 (two) times daily. (Patient not taking: Reported on 07/09/2024), Disp: , Rfl:    senna-docusate (SENOKOT-S) 8.6-50 MG per tablet, Take 1 tablet by mouth daily.  , Disp: , Rfl:    VITAMIN D , CHOLECALCIFEROL, PO, Take 1 tablet by mouth daily., Disp: , Rfl:    zolpidem  (AMBIEN ) 5 MG tablet, TAKE 1 TABLET (5 MG TOTAL) BY MOUTH EVERY DAY AT BEDTIME AS NEEDED FOR SLEEP, Disp: 30 tablet, Rfl: 3   Physical Exam:   BP 135/74   Pulse (!) 55   Temp 97.7 F (36.5 C)   Ht 5' 4 (1.626 m)   Wt 115 lb (52.2 kg)   SpO2 90%   BMI 19.74 kg/m   The patient was awake, alert, and appropriate. The external ears were inspected, and  otoscopy was performed to evaluate the external auditory canals and tympanic membranes. The nasal cavity and septum were examined for mucosal changes, obstruction, or discharge. The oral cavity and oropharynx were inspected for mucosal lesions, infection, or tonsillar hypertrophy. The neck was palpated for lymphadenopathy, thyroid  abnormalities, or other masses. Cranial nerve function was grossly intact.  Pertinent Findings: Physical Exam HEENT: Cerumen present in right ear. Nasal septum deviated to the left. Dentition normal. Throat normal. Nasal passages normal. Nasal passage tight on left side.   Seprately Identifiable Procedures:  I personally ordered, reviewed and interpreted the following with the patient today  Given the patient's symptoms and incomplete visualization of critical sinonasal areas with anterior rhinoscopy, a separately performed diagnostic nasal endoscopy procedure is indicated for a complete rhinologic evaluation per American Rhinologic Society recommendations (https://www.american-rhinologic.org/position-statements)  I personally ordered, reviewed and interpreted the following with the patient today  Procedure Note Diagnostic Nasal Endoscopy CPT CODE -- 68768 - Mod 25  Prior to initiating any procedures, risks/benefits/alternatives were explained to the patient and verbal consent obtained.  Pre-procedure diagnosis: Concern for active sinusitis vs mass Post-procedure diagnosis: same Indication: See pre-procedure diagnosis and physical exam above Complications: None apparent EBL: 0 mL Anesthesia: Lidocaine  4% and topical decongestant was topically sprayed in each nasal cavity  Description of Procedure:  Patient was identified. A flexible fiberoptic endoscope was utilized to evaluate the sinonasal cavities, mucosa, sinus ostia and turbinates and septum.  Overall, signs of mucosal inflammation are noted.  Also noted are edema superior meatus and left DNS.  No  mucopurulence, polyps, or masses noted.   Right Middle meatus: congested Right SE Recess: clear Left MM: edema Left SE Recess: clear  Procedure Note Pre-procedure diagnosis:  Dysphonia  Post-procedure diagnosis: Same Procedure: Transnasal Fiberoptic Laryngoscopy, CPT 31575 - Mod 25 Indication: reported worsening dysphona and weak voice.  Complications: None apparent EBL: 0 mL  The procedure was undertaken to further evaluate the patient's complaint of dysphonia, with mirror exam inadequate for appropriate examination due to gag reflex and poor patient tolerance  Procedure:  Patient was identified as correct patient. Verbal consent was obtained. The nose was sprayed with oxymetazoline and 4% lidocaine . The  The flexible laryngoscope was passed through the nose to view the nasal cavity, pharynx (oropharynx, hypopharynx) and larynx.  The larynx was examined at rest and during multiple phonatory tasks. Documentation was obtained and reviewed with patient. The scope was removed. The patient tolerated the procedure well.  Findings: The nasal cavity and nasopharynx did not reveal any masses or lesions, mucosa appeared to be without obvious lesions. The tongue base, pharyngeal walls, piriform sinuses, vallecula, epiglottis and postcricoid region are normal in appearance EXCEPT: bowing of the vocal folds. The visualized portion of the subglottis and proximal trachea is widely patent. The vocal folds are mobile bilaterally. There are no lesions on the free edge of the vocal folds nor elsewhere in the larynx worrisome for malignancy.    Electronically signed by: Penne Croak, DO 09/01/2024 10:41 AM   Impression & Plans:  Liala Codispoti is a 83 y.o. female  1. Chronic frontal sinusitis   2. Dysphonia    - Findings and diagnoses discussed in detail with the patient. - Risks, benefits, and alternatives were reviewed. Through shared decision making, the patient elects to proceed with below. Assessment  & Plan Acute sinusitis with headache and nasal discharge. Prefers medical management before surgery. - Prescribe doxycycline . Instructed how to take the medication - Instruct to use neti pot with distilled water . Sample provided. - Order CT scan of sinuses.  Chronic rhinitis with persistent rhinorrhea, likely exacerbated by sinus infection. Previous treatments ineffective. - Prescribe ipratropium bromide nasal spray three times daily or as needed.  Dysphonia with concerns about voice volume and quality. Previous speech therapy ineffective. - Discuss referral to different speech therapy location/ therapist.  - Orders placed:  Orders Placed This Encounter  Procedures   CT SINUS WO CONTRAST   Ambulatory referral to Speech Therapy   - Medications prescribed/continued/adjusted:  Meds ordered this encounter  Medications   ipratropium (ATROVENT) 0.03 % nasal spray    Sig: Place 2 sprays into both nostrils 3 (three) times daily with meals as needed for rhinitis.    Dispense:  30 mL    Refill:  12   doxycycline  (VIBRA -TABS) 100 MG tablet    Sig: Take 1 tablet (100 mg total) by mouth 2 (two) times daily before lunch and supper for 7 days.    Dispense:  14 tablet    Refill:  0   - Education materials provided to the patient. - Follow up: 6 weeks. Patient instructed to return sooner or go to the ED if new/worsening symptoms develop.  Thank you for allowing me the opportunity to care for your patient. Please do not hesitate to contact me should you have any other questions.  Sincerely, Penne Croak, DO Otolaryngologist (ENT) [facility] Phone: [phone] Fax: [phone]  09/01/2024, 10:36 AM

## 2024-09-03 ENCOUNTER — Encounter: Payer: Self-pay | Admitting: Podiatry

## 2024-09-03 ENCOUNTER — Ambulatory Visit (INDEPENDENT_AMBULATORY_CARE_PROVIDER_SITE_OTHER): Admitting: Podiatry

## 2024-09-03 VITALS — Ht 64.0 in | Wt 115.0 lb

## 2024-09-03 DIAGNOSIS — M79675 Pain in left toe(s): Secondary | ICD-10-CM

## 2024-09-03 DIAGNOSIS — B351 Tinea unguium: Secondary | ICD-10-CM

## 2024-09-03 DIAGNOSIS — M79674 Pain in right toe(s): Secondary | ICD-10-CM | POA: Diagnosis not present

## 2024-09-03 NOTE — Progress Notes (Signed)
   Chief Complaint  Patient presents with   Nail Problem    Pt is here due to bilateral great toenail issues, the right toenail is discolored and the left is very thick, pt has been seeing Dr Magdalen and wants a second opinion on the toenail. States the left toenail hurts and the right is ugly.    SUBJECTIVE Patient presents to office today complaining of elongated, thickened nails that cause pain while ambulating in shoes.  Patient is unable to trim their own nails. Patient is here for further evaluation and treatment.  Past Medical History:  Diagnosis Date   Allergy     SEASONAL   Anxiety    BREAST CANCER 1991   left, 1991; B mastectomy   CAD (coronary artery disease)    s/p CABG x5 in 2007   Cataract    BILATERAL-REMOVED   Depression    DIVERTICULOSIS    GERD    History of DVT (deep vein thrombosis)    HYPERLIPIDEMIA    Hypertension    HYPOTHYROIDISM    Occlusion and stenosis of carotid artery without mention of cerebral infarction    OSTEOPOROSIS    S/P TAVR (transcatheter aortic valve replacement) 06/21/2023   s/p TAVR with a 23mm Edwards S3UR via the TF approach by Dr. Verlin & Dr. Maryjane   Severe aortic stenosis     Allergies  Allergen Reactions   Clarithromycin Other (See Comments)    PATIENT UNSURE OF REACTION   Clindamycin Other (See Comments)   Codeine     REACTION: nausea   Levofloxacin     REACTION: nausea   Morphine Other (See Comments)    Nausea  Nausea    Naproxen     REACTION: nausea   Omeprazole  Other (See Comments)    Made her jittery, made her feel weird, ?rash   Penicillins Other (See Comments) and Rash    REACTION: RASH REACTION: RASH    Pneumococcal Vaccines Rash    Small rash on arm at injection site.      OBJECTIVE General Patient is awake, alert, and oriented x 3 and in no acute distress. Derm Skin is dry and supple bilateral. Negative open lesions or macerations. Remaining integument unremarkable. Nails are tender, long,  thickened and dystrophic with subungual debris, consistent with onychomycosis, 1-5 bilateral. No signs of infection noted. Vasc  DP and PT pedal pulses palpable bilaterally. Temperature gradient within normal limits.  Neuro Epicritic and protective threshold sensation grossly intact bilaterally.  Musculoskeletal Exam No symptomatic pedal deformities noted bilateral. Muscular strength within normal limits.  ASSESSMENT 1.  Pain due to onychomycosis of toenails both  PLAN OF CARE 1. Patient evaluated today.  2. Instructed to maintain good pedal hygiene and foot care.  3. Mechanical debridement of nails 1-5 bilaterally performed using a nail nipper. Filed with dremel without incident.  4.  OTC Tolcylen antifungal topical dispensed to apply daily  5.  Return to clinic 6 months   Thresa EMERSON Sar, DPM [facility]  Dr. Thresa EMERSON Sar, DPM    [address] [place], [place] [address]                Office [phone]  Fax [phone]

## 2024-09-11 ENCOUNTER — Encounter (HOSPITAL_COMMUNITY): Payer: Self-pay

## 2024-09-11 ENCOUNTER — Emergency Department (HOSPITAL_COMMUNITY)

## 2024-09-11 ENCOUNTER — Observation Stay (HOSPITAL_COMMUNITY)
Admission: EM | Admit: 2024-09-11 | Discharge: 2024-09-12 | Disposition: A | Discharge status: Still In Hospital | Attending: Family Medicine | Admitting: Family Medicine

## 2024-09-11 ENCOUNTER — Other Ambulatory Visit: Payer: Self-pay

## 2024-09-11 DIAGNOSIS — R079 Chest pain, unspecified: Secondary | ICD-10-CM | POA: Diagnosis not present

## 2024-09-11 DIAGNOSIS — R0789 Other chest pain: Secondary | ICD-10-CM | POA: Diagnosis not present

## 2024-09-11 DIAGNOSIS — E785 Hyperlipidemia, unspecified: Secondary | ICD-10-CM | POA: Insufficient documentation

## 2024-09-11 DIAGNOSIS — Z8673 Personal history of transient ischemic attack (TIA), and cerebral infarction without residual deficits: Secondary | ICD-10-CM | POA: Diagnosis not present

## 2024-09-11 DIAGNOSIS — I509 Heart failure, unspecified: Secondary | ICD-10-CM

## 2024-09-11 DIAGNOSIS — T7840XA Allergy, unspecified, initial encounter: Secondary | ICD-10-CM | POA: Insufficient documentation

## 2024-09-11 DIAGNOSIS — Z7901 Long term (current) use of anticoagulants: Secondary | ICD-10-CM | POA: Insufficient documentation

## 2024-09-11 DIAGNOSIS — Z8679 Personal history of other diseases of the circulatory system: Secondary | ICD-10-CM | POA: Diagnosis not present

## 2024-09-11 DIAGNOSIS — I4891 Unspecified atrial fibrillation: Principal | ICD-10-CM | POA: Diagnosis present

## 2024-09-11 DIAGNOSIS — I5A Non-ischemic myocardial injury (non-traumatic): Secondary | ICD-10-CM | POA: Diagnosis not present

## 2024-09-11 DIAGNOSIS — I491 Atrial premature depolarization: Secondary | ICD-10-CM | POA: Diagnosis not present

## 2024-09-11 DIAGNOSIS — E039 Hypothyroidism, unspecified: Secondary | ICD-10-CM | POA: Diagnosis not present

## 2024-09-11 DIAGNOSIS — Z952 Presence of prosthetic heart valve: Secondary | ICD-10-CM | POA: Diagnosis not present

## 2024-09-11 DIAGNOSIS — K219 Gastro-esophageal reflux disease without esophagitis: Secondary | ICD-10-CM | POA: Insufficient documentation

## 2024-09-11 DIAGNOSIS — Z79899 Other long term (current) drug therapy: Secondary | ICD-10-CM | POA: Diagnosis not present

## 2024-09-11 DIAGNOSIS — Z7982 Long term (current) use of aspirin: Secondary | ICD-10-CM | POA: Diagnosis not present

## 2024-09-11 DIAGNOSIS — I2510 Atherosclerotic heart disease of native coronary artery without angina pectoris: Secondary | ICD-10-CM | POA: Insufficient documentation

## 2024-09-11 DIAGNOSIS — I110 Hypertensive heart disease with heart failure: Secondary | ICD-10-CM | POA: Diagnosis not present

## 2024-09-11 DIAGNOSIS — I5033 Acute on chronic diastolic (congestive) heart failure: Secondary | ICD-10-CM | POA: Diagnosis not present

## 2024-09-11 DIAGNOSIS — F39 Unspecified mood [affective] disorder: Secondary | ICD-10-CM | POA: Diagnosis not present

## 2024-09-11 DIAGNOSIS — I779 Disorder of arteries and arterioles, unspecified: Secondary | ICD-10-CM | POA: Insufficient documentation

## 2024-09-11 DIAGNOSIS — J811 Chronic pulmonary edema: Secondary | ICD-10-CM | POA: Diagnosis not present

## 2024-09-11 DIAGNOSIS — Z951 Presence of aortocoronary bypass graft: Secondary | ICD-10-CM | POA: Diagnosis not present

## 2024-09-11 DIAGNOSIS — R7989 Other specified abnormal findings of blood chemistry: Secondary | ICD-10-CM

## 2024-09-11 DIAGNOSIS — J90 Pleural effusion, not elsewhere classified: Secondary | ICD-10-CM | POA: Diagnosis not present

## 2024-09-11 DIAGNOSIS — R0602 Shortness of breath: Secondary | ICD-10-CM | POA: Diagnosis not present

## 2024-09-11 DIAGNOSIS — R0989 Other specified symptoms and signs involving the circulatory and respiratory systems: Secondary | ICD-10-CM | POA: Diagnosis not present

## 2024-09-11 DIAGNOSIS — I499 Cardiac arrhythmia, unspecified: Secondary | ICD-10-CM | POA: Diagnosis not present

## 2024-09-11 LAB — COMPREHENSIVE METABOLIC PANEL WITH GFR
ALT: 31 U/L (ref 0–44)
AST: 43 U/L — ABNORMAL HIGH (ref 15–41)
Albumin: 3.3 g/dL — ABNORMAL LOW (ref 3.5–5.0)
Alkaline Phosphatase: 57 U/L (ref 38–126)
Anion gap: 7 (ref 5–15)
BUN: 14 mg/dL (ref 8–23)
CO2: 23 mmol/L (ref 22–32)
Calcium: 7.9 mg/dL — ABNORMAL LOW (ref 8.9–10.3)
Chloride: 104 mmol/L (ref 98–111)
Creatinine, Ser: 0.66 mg/dL (ref 0.44–1.00)
GFR, Estimated: >60 mL/min (ref >60)
Glucose, Bld: 154 mg/dL — ABNORMAL HIGH (ref 70–99)
Potassium: 4.1 mmol/L (ref 3.5–5.1)
Sodium: 134 mmol/L — ABNORMAL LOW (ref 135–145)
Total Bilirubin: 0.9 mg/dL (ref 0.0–1.2)
Total Protein: 5.8 g/dL — ABNORMAL LOW (ref 6.5–8.1)

## 2024-09-11 LAB — CBC WITH DIFFERENTIAL/PLATELET
Abs Immature Granulocytes: 0.04 K/uL (ref 0.00–0.07)
Basophils Absolute: 0.1 K/uL (ref 0.0–0.1)
Basophils Relative: 1 %
Eosinophils Absolute: 0.2 K/uL (ref 0.0–0.5)
Eosinophils Relative: 2 %
HCT: 38.8 % (ref 36.0–46.0)
Hemoglobin: 12.3 g/dL (ref 12.0–15.0)
Immature Granulocytes: 0 %
Lymphocytes Relative: 18 %
Lymphs Abs: 1.9 K/uL (ref 0.7–4.0)
MCH: 29.2 pg (ref 26.0–34.0)
MCHC: 31.7 g/dL (ref 30.0–36.0)
MCV: 92.2 fL (ref 80.0–100.0)
Monocytes Absolute: 0.8 K/uL (ref 0.1–1.0)
Monocytes Relative: 8 %
Neutro Abs: 7.4 K/uL (ref 1.7–7.7)
Neutrophils Relative %: 71 %
Platelets: 245 K/uL (ref 150–400)
RBC: 4.21 MIL/uL (ref 3.87–5.11)
RDW: 14.6 % (ref 11.5–15.5)
WBC: 10.5 K/uL (ref 4.0–10.5)
nRBC: 0 % (ref 0.0–0.2)

## 2024-09-11 LAB — TROPONIN I (HIGH SENSITIVITY): Troponin I (High Sensitivity): 40 ng/L — ABNORMAL HIGH (ref <18)

## 2024-09-11 LAB — BRAIN NATRIURETIC PEPTIDE: B Natriuretic Peptide: 424.3 pg/mL — ABNORMAL HIGH (ref 0.0–100.0)

## 2024-09-11 NOTE — ED Triage Notes (Signed)
 Pt BIB [facility] coming from home. Pt says that at 1930 she began feeling chest pain. Pain generalized and radiating. AFIb RVR with EMS.  EMS:  1L NS 10mg  Cardizem IV 140/80  Patient in 150s on arrival.

## 2024-09-11 NOTE — ED Triage Notes (Signed)
 Arrives [facility] from home with concern of sudden generalized chest pains while cooking dinner. Sat down and said something just feels wrong.   Upon EMS arrival pt found afiv-rvr with with rate of 180 and no known history of.   10mg  Diltiazem IVP & 1000cc NS admin prior to arrival.   Currently pain free.

## 2024-09-12 ENCOUNTER — Other Ambulatory Visit (HOSPITAL_COMMUNITY): Payer: Self-pay

## 2024-09-12 ENCOUNTER — Inpatient Hospital Stay (HOSPITAL_COMMUNITY)

## 2024-09-12 DIAGNOSIS — I4891 Unspecified atrial fibrillation: Secondary | ICD-10-CM | POA: Diagnosis not present

## 2024-09-12 DIAGNOSIS — Z8673 Personal history of transient ischemic attack (TIA), and cerebral infarction without residual deficits: Secondary | ICD-10-CM

## 2024-09-12 DIAGNOSIS — E039 Hypothyroidism, unspecified: Secondary | ICD-10-CM | POA: Diagnosis not present

## 2024-09-12 DIAGNOSIS — I10 Essential (primary) hypertension: Secondary | ICD-10-CM

## 2024-09-12 DIAGNOSIS — R7989 Other specified abnormal findings of blood chemistry: Secondary | ICD-10-CM

## 2024-09-12 DIAGNOSIS — I5033 Acute on chronic diastolic (congestive) heart failure: Secondary | ICD-10-CM

## 2024-09-12 LAB — CBC
HCT: 34.6 % — ABNORMAL LOW (ref 36.0–46.0)
Hemoglobin: 11.2 g/dL — ABNORMAL LOW (ref 12.0–15.0)
MCH: 29 pg (ref 26.0–34.0)
MCHC: 32.4 g/dL (ref 30.0–36.0)
MCV: 89.6 fL (ref 80.0–100.0)
Platelets: 222 K/uL (ref 150–400)
RBC: 3.86 MIL/uL — ABNORMAL LOW (ref 3.87–5.11)
RDW: 14.6 % (ref 11.5–15.5)
WBC: 8.3 K/uL (ref 4.0–10.5)
nRBC: 0 % (ref 0.0–0.2)

## 2024-09-12 LAB — BASIC METABOLIC PANEL WITH GFR
Anion gap: 8 (ref 5–15)
BUN: 11 mg/dL (ref 8–23)
CO2: 24 mmol/L (ref 22–32)
Calcium: 8.6 mg/dL — ABNORMAL LOW (ref 8.9–10.3)
Chloride: 107 mmol/L (ref 98–111)
Creatinine, Ser: 0.6 mg/dL (ref 0.44–1.00)
GFR, Estimated: >60 mL/min (ref >60)
Glucose, Bld: 106 mg/dL — ABNORMAL HIGH (ref 70–99)
Potassium: 3.7 mmol/L (ref 3.5–5.1)
Sodium: 139 mmol/L (ref 135–145)

## 2024-09-12 LAB — ECHOCARDIOGRAM LIMITED
Height: 64 in
S' Lateral: 2 cm
Weight: 1840 [oz_av]

## 2024-09-12 LAB — TROPONIN I (HIGH SENSITIVITY)
Troponin I (High Sensitivity): 225 ng/L (ref <18)
Troponin I (High Sensitivity): 567 ng/L (ref <18)
Troponin I (High Sensitivity): 569 ng/L (ref <18)

## 2024-09-12 LAB — TSH: TSH: 2.435 u[IU]/mL (ref 0.350–4.500)

## 2024-09-12 LAB — HEPARIN LEVEL (UNFRACTIONATED): Heparin Unfractionated: 0.42 [IU]/mL (ref 0.30–0.70)

## 2024-09-12 LAB — MAGNESIUM: Magnesium: 2 mg/dL (ref 1.7–2.4)

## 2024-09-12 MED ORDER — SENNOSIDES-DOCUSATE SODIUM 8.6-50 MG PO TABS
1.0000 | ORAL_TABLET | Freq: Every evening | ORAL | Status: DC | PRN
Start: 2024-09-12 — End: 2024-09-12

## 2024-09-12 MED ORDER — ACETAMINOPHEN 650 MG RE SUPP: 650.0000 mg | Freq: Four times a day (QID) | RECTAL | Status: DC | PRN

## 2024-09-12 MED ORDER — LEVOTHYROXINE SODIUM 75 MCG PO TABS
75.0000 ug | ORAL_TABLET | Freq: Every day | ORAL | Status: DC
  Administered 2024-09-12: 75 ug via ORAL
  Filled 2024-09-12: qty 1

## 2024-09-12 MED ORDER — IPRATROPIUM BROMIDE 0.06 % NA SOLN: 2.0000 | Freq: Three times a day (TID) | NASAL | Status: DC | PRN

## 2024-09-12 MED ORDER — VITAMIN D 25 MCG (1000 UNIT) PO TABS
1000.0000 [IU] | ORAL_TABLET | Freq: Every day | ORAL | Status: DC
  Administered 2024-09-12: 1000 [IU] via ORAL
  Filled 2024-09-12: qty 1

## 2024-09-12 MED ORDER — FLUTICASONE PROPIONATE 50 MCG/ACT NA SUSP: 1.0000 | Freq: Every day | NASAL | Status: DC | PRN

## 2024-09-12 MED ORDER — ASPIRIN 81 MG PO CHEW
81.0000 mg | CHEWABLE_TABLET | Freq: Every day | ORAL | Status: DC
  Administered 2024-09-12: 81 mg via ORAL
  Filled 2024-09-12: qty 1

## 2024-09-12 MED ORDER — ONDANSETRON HCL 4 MG PO TABS: 4.0000 mg | ORAL_TABLET | Freq: Four times a day (QID) | ORAL | Status: DC | PRN

## 2024-09-12 MED ORDER — ZOLPIDEM TARTRATE 5 MG PO TABS: 5.0000 mg | ORAL_TABLET | Freq: Every evening | ORAL | Status: DC | PRN

## 2024-09-12 MED ORDER — CITALOPRAM HYDROBROMIDE 10 MG PO TABS
20.0000 mg | ORAL_TABLET | Freq: Every day | ORAL | Status: DC
  Administered 2024-09-12: 20 mg via ORAL
  Filled 2024-09-12: qty 2

## 2024-09-12 MED ORDER — METOPROLOL TARTRATE 25 MG PO TABS
25.0000 mg | ORAL_TABLET | Freq: Two times a day (BID) | ORAL | Status: DC
  Administered 2024-09-12: 25 mg via ORAL
  Filled 2024-09-12: qty 1

## 2024-09-12 MED ORDER — ONDANSETRON HCL 4 MG/2ML IJ SOLN: 4.0000 mg | Freq: Four times a day (QID) | INTRAMUSCULAR | Status: DC | PRN

## 2024-09-12 MED ORDER — SPIRONOLACTONE 25 MG PO TABS
25.0000 mg | ORAL_TABLET | Freq: Every day | ORAL | Status: DC
  Administered 2024-09-12: 25 mg via ORAL
  Filled 2024-09-12: qty 1

## 2024-09-12 MED ORDER — HEPARIN (PORCINE) 25000 UT/250ML-% IV SOLN
800.0000 [IU]/h | INTRAVENOUS | Status: DC
  Administered 2024-09-12: 800 [IU]/h via INTRAVENOUS
  Filled 2024-09-12: qty 250

## 2024-09-12 MED ORDER — FUROSEMIDE 10 MG/ML IJ SOLN
40.0000 mg | Freq: Once | INTRAMUSCULAR | Status: AC
  Administered 2024-09-12: 40 mg via INTRAVENOUS

## 2024-09-12 MED ORDER — APIXABAN 2.5 MG PO TABS: 2.5000 mg | ORAL_TABLET | Freq: Two times a day (BID) | ORAL | Status: DC

## 2024-09-12 MED ORDER — PROSIGHT PO TABS
1.0000 | ORAL_TABLET | Freq: Two times a day (BID) | ORAL | Status: DC
  Filled 2024-09-12 (×3): qty 1

## 2024-09-12 MED ORDER — ATORVASTATIN CALCIUM 10 MG PO TABS
20.0000 mg | ORAL_TABLET | Freq: Every day | ORAL | Status: DC
  Administered 2024-09-12: 20 mg via ORAL
  Filled 2024-09-12: qty 2

## 2024-09-12 MED ORDER — BISACODYL 5 MG PO TBEC: 5.0000 mg | DELAYED_RELEASE_TABLET | Freq: Every day | ORAL | Status: DC | PRN

## 2024-09-12 MED ORDER — HEPARIN BOLUS VIA INFUSION
3500.0000 [IU] | Freq: Once | INTRAVENOUS | Status: AC
  Administered 2024-09-12: 3500 [IU] via INTRAVENOUS
  Filled 2024-09-12: qty 3500

## 2024-09-12 MED ORDER — APIXABAN 2.5 MG PO TABS
2.5000 mg | ORAL_TABLET | Freq: Two times a day (BID) | ORAL | 0 refills | Status: DC
  Filled 2024-09-12: qty 60, 30d supply, fill #0

## 2024-09-12 MED ORDER — ACETAMINOPHEN 325 MG PO TABS
650.0000 mg | ORAL_TABLET | Freq: Once | ORAL | Status: AC
  Administered 2024-09-12: 650 mg via ORAL
  Filled 2024-09-12: qty 2

## 2024-09-12 MED ORDER — FUROSEMIDE 10 MG/ML IJ SOLN
40.0000 mg | Freq: Every day | INTRAMUSCULAR | Status: DC
  Filled 2024-09-12: qty 4

## 2024-09-12 MED ORDER — COENZYME Q10 30 MG PO CAPS: 30.0000 mg | ORAL_CAPSULE | Freq: Every day | ORAL | Status: DC

## 2024-09-12 MED ORDER — ACETAMINOPHEN 325 MG PO TABS: 650.0000 mg | ORAL_TABLET | Freq: Four times a day (QID) | ORAL | Status: DC | PRN

## 2024-09-12 MED ORDER — ADULT MULTIVITAMIN W/MINERALS CH
1.0000 | ORAL_TABLET | Freq: Every day | ORAL | Status: DC
  Administered 2024-09-12: 1 via ORAL
  Filled 2024-09-12: qty 1

## 2024-09-12 MED ORDER — FAMOTIDINE 20 MG PO TABS
40.0000 mg | ORAL_TABLET | Freq: Every day | ORAL | Status: DC
  Administered 2024-09-12: 40 mg via ORAL
  Filled 2024-09-12: qty 2

## 2024-09-12 MED ORDER — LOSARTAN POTASSIUM 50 MG PO TABS
25.0000 mg | ORAL_TABLET | Freq: Every day | ORAL | Status: DC
  Administered 2024-09-12: 25 mg via ORAL
  Filled 2024-09-12: qty 1

## 2024-09-12 NOTE — ED Notes (Signed)
Pt daughter/POA at bedside

## 2024-09-12 NOTE — Progress Notes (Signed)
 PHARMACY - ANTICOAGULATION CONSULT NOTE  Pharmacy Consult for heparin  Indication: atrial fibrillation/ACS  Patient Measurements: Height: 5' 4 (162.6 cm) Weight: 52.2 kg (115 lb) IBW/kg (Calculated) : 54.7 HEPARIN  DW (KG): 52.2  Vital Signs: Temp: 98.2 F (36.8 C) (10/29 0937) Temp Source: Oral (10/29 0937) BP: 175/61 (10/29 1000) Pulse Rate: 58 (10/29 1000)  Labs: Recent Labs    09/11/24 2153 09/11/24 2352 09/12/24 0323 09/12/24 0415 09/12/24 0617 09/12/24 1042  HGB 12.3  --  11.2*  --   --   --   HCT 38.8  --  34.6*  --   --   --   PLT 245  --  222  --   --   --   HEPARINUNFRC  --   --   --   --   --  0.42  CREATININE 0.66  --  0.60  --   --   --   TROPONINIHS 40* 225*  --  569* 567*  --     Estimated Creatinine Clearance: 42.4 mL/min (by C-G formula based on SCr of 0.6 mg/dL).   Medical History: Past Medical History:  Diagnosis Date   Allergy     SEASONAL   Anxiety    BREAST CANCER 1991   left, 1991; B mastectomy   CAD (coronary artery disease)    s/p CABG x5 in 2007   Cataract    BILATERAL-REMOVED   Depression    DIVERTICULOSIS    GERD    History of DVT (deep vein thrombosis)    HYPERLIPIDEMIA    Hypertension    HYPOTHYROIDISM    Occlusion and stenosis of carotid artery without mention of cerebral infarction    OSTEOPOROSIS    S/P TAVR (transcatheter aortic valve replacement) 06/21/2023   s/p TAVR with a 23mm Edwards S3UR via the TF approach by Dr. Verlin & Dr. Maryjane   Severe aortic stenosis     Assessment: 60 yoF presented with chest pain. PMH includes severe aortic stenosis s/p TAVR (2024), HFpEF (LVEF 55%), HTN, and CAD (CABG 2007). Pharmacy consulted to dose heparin  for new onset afib RVR.  Initial heparin  level therapeutic on 800 units/hr  Goal of Therapy:  Heparin  level 0.3-0.7 units/ml Monitor platelets by anticoagulation protocol: Yes   Plan:  Continue heparin  gtt at 800 units/hr Daily heparin  level, CBC , s/s bleeding F/u  long term [facility] plan  Dorn Poot, PharmD, [facility] Clinical Pharmacist ED Pharmacist Phone # [phone] 09/12/2024 11:35 AM

## 2024-09-12 NOTE — Progress Notes (Addendum)
 PHARMACY - ANTICOAGULATION CONSULT NOTE  Pharmacy Consult for heparin  Indication: atrial fibrillation/ACS  Patient Measurements: Height: 5' 4 (162.6 cm) Weight: 52.2 kg (115 lb) IBW/kg (Calculated) : 54.7 HEPARIN  DW (KG): 52.2  Vital Signs: Temp: 97.6 F (36.4 C) (10/28 2209) Temp Source: Oral (10/28 2209) BP: 166/52 (10/29 0115) Pulse Rate: 32 (10/29 0115)  Labs: Recent Labs    09/11/24 2153 09/11/24 2352  HGB 12.3  --   HCT 38.8  --   PLT 245  --   CREATININE 0.66  --   TROPONINIHS 40* 225*    Estimated Creatinine Clearance: 42.4 mL/min (by C-G formula based on SCr of 0.66 mg/dL).   Medical History: Past Medical History:  Diagnosis Date   Allergy     SEASONAL   Anxiety    BREAST CANCER 1991   left, 1991; B mastectomy   CAD (coronary artery disease)    s/p CABG x5 in 2007   Cataract    BILATERAL-REMOVED   Depression    DIVERTICULOSIS    GERD    History of DVT (deep vein thrombosis)    HYPERLIPIDEMIA    Hypertension    HYPOTHYROIDISM    Occlusion and stenosis of carotid artery without mention of cerebral infarction    OSTEOPOROSIS    S/P TAVR (transcatheter aortic valve replacement) 06/21/2023   s/p TAVR with a 23mm Edwards S3UR via the TF approach by Dr. Verlin & Dr. Maryjane   Severe aortic stenosis     Assessment: [name] presented with chest pain. PMH includes severe aortic stenosis s/p TAVR (2024), HFpEF (LVEF Angelica%), HTN, and CAD (CABG 2007). Pharmacy consulted to dose heparin  for new onset afib RVR.  -CBC WNL -No PTA anticoagulation -Also has trop elevation from 40 > 225  Goal of Therapy:  Heparin  level 0.3-0.7 units/ml Monitor platelets by anticoagulation protocol: Yes   Plan:  Give 3500 units bolus x 1 Start heparin  infusion at 800 units/hr Check anti-Xa level in 8 hours and daily while on heparin  Continue to monitor H&H and platelets  Lynwood Poplar, PharmD, BCPS Clinical Pharmacist 09/12/2024 1:25 AM

## 2024-09-12 NOTE — ED Notes (Signed)
 Pt noted blood on her tissue when she wiped her nose, concerned due to being on the heparin  drip, provider notified. Tissue was pink tinged, no profuse bleeding noted coming from pt nostrils.

## 2024-09-12 NOTE — Progress Notes (Signed)
 Cardiology Progress Note  Patient ID: [name] MRN: [id number] DOB: [birth year] Date of Encounter: 09/12/2024 Primary Cardiologist: Ozell Fell, MD  Subjective   Chief Complaint: Weakness   HPI: In NSR. Reports weakness.   ROS:  All other ROS reviewed and negative. Pertinent positives noted in the HPI.     Telemetry  Overnight telemetry shows SR 60s, which I personally reviewed.   ECG  The most recent ECG shows SR 63, nonspecific STT changes, which I personally reviewed.   Physical Exam   Vitals:   09/12/24 0700 09/12/24 0715 09/12/24 0800 09/12/24 0900  BP: (!) 180/60  (!) 183/73 (!) 179/54  Pulse: 61  84 63  Resp: 18 12 (!) 21 11  Temp:      TempSrc:      SpO2: 100% 96% 99% 98%  Weight:      Height:        Intake/Output Summary (Last 24 hours) at 09/12/2024 0911 Last data filed at 09/12/2024 0900 Gross per 24 hour  Intake 44.87 ml  Output 2500 ml  Net -2455.13 ml       09/11/2024    9:39 PM 09/03/2024    2:36 PM 08/31/2024    1:38 PM  Last 3 Weights  Weight (lbs) 115 lb 115 lb 115 lb  Weight (kg) 52.164 kg 52.164 kg 52.164 kg    Body mass index is 19.74 kg/m.  General: Well nourished, well developed, in no acute distress Head: Atraumatic, normal size  Eyes: PEERLA, EOMI  Neck: Supple, no JVD Endocrine: No thryomegaly Cardiac: Normal S1, S2; RRR; no murmurs, rubs, or gallops Lungs: Clear to auscultation bilaterally, no wheezing, rhonchi or rales  Abd: Soft, nontender, no hepatomegaly  Ext: No edema, pulses 2+ Musculoskeletal: No deformities, BUE and BLE strength normal and equal Skin: Warm and dry, no rashes   Neuro: Alert and oriented to person, place, time, and situation, CNII-XII grossly intact, no focal deficits  Psych: Normal mood and affect   Cardiac Studies  TTE 07/05/2024  1. Left ventricular ejection fraction, by estimation, is 55 to 60%. The  left ventricle has normal function. The left ventricle has no regional  wall motion  abnormalities. Left ventricular diastolic parameters are  consistent with Grade II diastolic  dysfunction (pseudonormalization).   2. Right ventricular systolic function is mildly reduced. The right  ventricular size is normal.   3. The mitral valve is degenerative. No evidence of mitral valve  regurgitation. No evidence of mitral stenosis.   4. The aortic valve has been repaired/replaced. Aortic valve  regurgitation is not visualized. No aortic stenosis is present. There is a  23 mm Sapien prosthetic (TAVR) valve present in the aortic position.  Procedure Date: 06/21/2023. Echo findings are  consistent with normal structure and function of the aortic valve  prosthesis. Aortic valve mean gradient measures 9.0 mmHg. Aortic valve  Vmax measures 2.03 m/s.   5. Pulmonic valve regurgitation is moderate.   6. The inferior vena cava is normal in size with greater than 50%  respiratory variability, suggesting right atrial pressure of 3 mmHg.   LHC/RHC 06/10/2023   Mid RCA lesion is 100% stenosed.   Prox Cx lesion is 99% stenosed.   Ost LAD to Prox LAD lesion is 90% stenosed.   SVG graft was visualized by angiography and is normal in caliber.   SVG graft was visualized by angiography and is normal in caliber.   LIMA graft was visualized by angiography and is normal in  caliber.   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 Patent sequential vein graft to the LAD and Diagonal Severe proximal Circumflex stenosis. Patent LIMA graft to the obtuse marginal branch Chronic total occlusion of the mid RCA. The PDA and posterolateral arteries fill from the sequential vein graft.  Elevated right heart pressures  RA 7 RV 47/7/9 PA 48/19 mean 35 PCWP 26  Patient Profile  [name] is a 83 y.o. female with CAD status post CABG, aortic stenosis status post TAVR, diastolic heart failure, carotid artery disease, PAD s/p R femoral endarterectomy, stroke,  hyperlipidemia admitted on 09/11/2024 for A-fib with RVR.  Assessment & Plan   # A-fib with RVR - Has converted back to sinus rhythm. - Plan to continue heparin .  We will obtain an echo to make sure there has been no change in her LV function.  If echo is unremarkable we will manage this medically. - Continue metoprolol  to tartrate 25 mg twice daily. - We will not commit her to rhythm control medications at this time.  # Elevated troponin, demand # CAD status post CABG - Troponins are minimally elevated.  EKG is nonischemic. - History of CABG.  Suspect this is just demand. - Follow-up echo.  On heparin  drip for A-fib.  Continue statin.  Beta-blocker as above. - On aspirin  at home.  We will likely pursue Eliquis monotherapy.  # Acute on chronic heart failure with preserved ejection fraction - No signs of volume overload.  Chest x-ray with pulmonary congestion.  Will give one-time dose of Lasix . - BNP slightly elevated.  Likely driven by A-fib. - Add Aldactone 25 mg daily.  # PAD - On aspirin  and statin.  Likely consolidate to Eliquis monotherapy.  # HTN - Metoprolol  to tartrate 25 mg twice daily, losartan  25 mg daily, adding Aldactone 25 mg daily.     For questions or updates, please contact [facility] Please consult [email or web address] for contact info under     Signed, Darryle T. Barbaraann, MD, [facility]   [facility]  09/12/2024 9:11 AM

## 2024-09-12 NOTE — ED Provider Notes (Signed)
 [place] EMERGENCY DEPARTMENT AT [facility] Provider Note   CSN: [id number] Arrival date & time: 09/11/24  2136     Patient presents with: Chest Pain   [name] is a 83 y.o. female.  With a history of severe aortic stenosis status post TAVR, heart failure, hypertension and CAD who presents to the ED for chest pain.  Acute onset chest pain that started around 1930 tonight while at rest in her kitchen at home.  Pain radiated through to both shoulders.  EMS noted the patient to be in atrial fibrillation with rapid ventricular response and administered 10 mg Cardizem which reestablish normal sinus rhythm.  Also gave 1 L IV fluids prior to arrival.  Patient reports chest pain has resolved but still having some discomfort in both shoulders.  No prior history of atrial fibrillation.  Denies anticoagulation.    Chest Pain      Prior to Admission medications   Medication Sig Start Date End Date Taking? Authorizing Provider  alendronate  (FOSAMAX ) 70 MG tablet Take 1 tablet (70 mg total) by mouth every 7 (seven) days. Take with a full glass of water  on an empty stomach. 03/16/24   Geofm Glade PARAS, MD  aspirin  81 MG tablet Take 81 mg by mouth daily.      [provider]  atorvastatin  (LIPITOR) 20 MG tablet TAKE 1 TABLET BY MOUTH EVERY DAY 12/27/23   Geofm Glade PARAS, MD  Calcium  Carbonate (CALTRATE 600 PO) Take 1 tablet by mouth 2 (two) times daily.      [provider]  citalopram  (CELEXA ) 20 MG tablet TAKE 1 TABLET BY MOUTH EVERY DAY 03/19/24   Geofm Glade PARAS, MD  co-enzyme Q-10 30 MG capsule Take 30 mg by mouth daily.    [provider]  famotidine  (PEPCID ) 40 MG tablet TAKE 1 TABLET BY MOUTH EVERY DAY 02/24/24   Geofm Glade PARAS, MD  fluticasone  (FLONASE ) 50 MCG/ACT nasal spray Place 1 spray into both nostrils daily as needed for allergies or rhinitis. 10/12/22   Geofm Glade PARAS, MD  furosemide  (LASIX ) 20 MG tablet Take 20 mg by mouth daily. Patient not taking:  Reported on 09/03/2024    [provider]  ipratropium (ATROVENT) 0.03 % nasal spray Place 2 sprays into both nostrils 3 (three) times daily with meals as needed for rhinitis. 08/31/24   Anice Riis, DO  levothyroxine  (SYNTHROID ) 75 MCG tablet TAKE 1 TABLET BY MOUTH EVERY MORNING BEFORE BREAKFAST 09/03/24   Geofm Glade PARAS, MD  losartan  (COZAAR ) 25 MG tablet Take 1 tablet (25 mg total) by mouth daily. 02/17/24   Geofm Glade PARAS, MD  meloxicam  (MOBIC ) 7.5 MG tablet TAKE 1/2 TO 1 TABLET BY MOUTH EVERY DAY 07/02/24   Geofm Glade PARAS, MD  metoprolol  tartrate (LOPRESSOR ) 25 MG tablet TAKE 1 TABLET BY MOUTH TWICE A DAY 08/10/24   Geofm Glade PARAS, MD  Multiple Vitamins-Minerals (MULTIVITAMIN) LIQD Take 1 tablet by mouth daily. 11/05/21   [provider]  Multiple Vitamins-Minerals (PRESERVISION AREDS 2) CAPS Take 1 capsule by mouth 2 (two) times daily. Patient not taking: Reported on 09/03/2024    [provider]  senna-docusate (SENOKOT-S) 8.6-50 MG per tablet Take 1 tablet by mouth daily.      [provider]  VITAMIN D , CHOLECALCIFEROL, PO Take 1 tablet by mouth daily.    [provider]  zolpidem  (AMBIEN ) 5 MG tablet TAKE 1 TABLET (5 MG TOTAL) BY MOUTH EVERY DAY AT BEDTIME AS  NEEDED FOR SLEEP 05/02/24   Geofm Glade PARAS, MD    Allergies: Clarithromycin, Clindamycin, Codeine, Levofloxacin, Morphine, Naproxen, Omeprazole , Penicillins, and Pneumococcal vaccines    Review of Systems  Cardiovascular:  Positive for chest pain.    Updated Vital Signs BP (!) 161/71   Pulse (!) 39   Temp 97.6 F (36.4 C) (Oral)   Resp 16   Ht 5' 4 (1.626 m)   Wt 52.2 kg   SpO2 100%   BMI 19.74 kg/m   Physical Exam Vitals and nursing note reviewed.  HENT:     Head: Normocephalic and atraumatic.  Eyes:     Pupils: Pupils are equal, round, and reactive to light.  Cardiovascular:     Rate and Rhythm: Normal rate and regular rhythm.  Pulmonary:     Effort: Pulmonary  effort is normal.     Breath sounds: Normal breath sounds.  Abdominal:     Palpations: Abdomen is soft.     Tenderness: There is no abdominal tenderness.  Skin:    General: Skin is warm and dry.  Neurological:     Mental Status: She is alert.  Psychiatric:        Mood and Affect: Mood normal.     (all labs ordered are listed, but only abnormal results are displayed) Labs Reviewed  COMPREHENSIVE METABOLIC PANEL WITH GFR - Abnormal; Notable for the following components:      Result Value   Sodium 134 (*)    Glucose, Bld 154 (*)    Calcium  7.9 (*)    Total Protein 5.8 (*)    Albumin 3.3 (*)    AST 43 (*)    All other components within normal limits  BRAIN NATRIURETIC PEPTIDE - Abnormal; Notable for the following components:   B Natriuretic Peptide 424.3 (*)    All other components within normal limits  TROPONIN I (HIGH SENSITIVITY) - Abnormal; Notable for the following components:   Troponin I (High Sensitivity) 40 (*)    All other components within normal limits  TROPONIN I (HIGH SENSITIVITY) - Abnormal; Notable for the following components:   Troponin I (High Sensitivity) 225 (*)    All other components within normal limits  CBC WITH DIFFERENTIAL/PLATELET    EKG: EKG Interpretation Date/Time:  Tuesday September 11 2024 22:02:02 EDT Ventricular Rate:  57 PR Interval:  198 QRS Duration:  90 QT Interval:  442 QTC Calculation: 431 R Axis:   29  Text Interpretation: Sinus rhythm Low voltage, precordial leads Consider anterior infarct Nonspecific T abnormalities, lateral leads Confirmed by Pamella Sharper [phone]) on 09/11/2024 11:37:11 PM  Radiology: [facility] Chest Portable 1 View Result Date: 09/11/2024 EXAM: 1 VIEW(S) XRAY OF THE CHEST 09/11/2024 10:05:00 PM COMPARISON: PA and lateral chest 03/16/2024. CLINICAL HISTORY: cp. Triage notes: Arrives [facility] from home with concern of sudden generalized chest pains while cooking dinner. Sat down and said something just feels wrong.  Upon EMS arrival pt found afiv-rvr with with rate of 180 and no known history of. 10mg  Diltiazem IVP \\T \ 1000cc NS admin prior to arrival. Currently pain free. FINDINGS: LINES, TUBES AND DEVICES: Sternotomy and CABG changes, TAVR are again noted. Bilateral axillary surgical clips. There are artifacts from overlying clothing and wiring. LUNGS AND PLEURA: Generalized interstitial edema with a basal gradient. Minimal pleural effusions are beginning to form. No acute airspace infiltrate is seen. No focal pulmonary opacity. No pneumothorax. HEART AND MEDIASTINUM: Mild cardiomegaly. Interval increased central vascular congestion. The mediastinum is stable with calcification and  tortuosity in the aorta. BONES AND SOFT TISSUES: Levoscoliosis in the lower thoracic spine, osteopenia, and degenerative changes of the spine. No new osseous findings. IMPRESSION: 1. Interval increased central vascular congestion and generalized interstitial edema with a basal gradient. Findings consistent with CHF or fluid overload. 2. Minimal pleural effusions beginning to form. 3. Mild cardiomegaly. Electronically signed by: Francis Quam MD 09/11/2024 10:19 PM EDT RP Workstation: [id number]     Procedures   Medications Ordered in the ED - No data to display  Clinical Course as of 09/12/24 0105  Wed Sep 12, 2024  0013 Initial workup notable for elevated troponin at 40.  Will need to obtain delta.  No significant electrolyte imbalances.  BNP a bit elevated at 424.  Chest x-ray shows concern for pulmonary edema in the setting of known heart failure.  EKGs remain in normal sinus rhythm with acute ischemic changes.  Patient mains hemodynamically stable normal sinus rhythm  I, Ozell Marine DO, am transitioning care of this patient to the oncoming provider pending delta troponin reevaluation and likely admission [MP]    Clinical Course User Index [MP] Marine Ozell LABOR, DO                                 Medical Decision  Making 83 year old female with history as above presents to the ED for reported chest pain.  Was noted to be in A-fib RVR with EMS initial rate in the 180s.  Resolved after 10 mg Cardizem from EMS.  Back in normal sinus rhythm on my initial assessment slightly hypertensive.  States chest pain has resolved but still having some pain in shoulders.  Considering cardiac history presentation concerning for new onset A-fib as well as ACS.  Will obtain cardiac workup including troponin chest x-ray laboratory workup EKG and continue to monitor on telemetry.  Admission likely.  Amount and/or Complexity of Data Reviewed Labs: ordered. Radiology: ordered.        Final diagnoses:  Chest pain, unspecified type  Atrial fibrillation with rapid ventricular response (HCC)  Acute on chronic heart failure, unspecified heart failure type [facility])    ED Discharge Orders     None          Marine Ozell LABOR, DO 09/12/24 0105

## 2024-09-12 NOTE — Consult Note (Signed)
 Cardiology Consultation   Patient ID: [name] MRN: [id number]; DOB: 1941/07/01  Admit date: 09/11/2024 Date of Consult: 09/12/2024  PCP:  Geofm Glade JINNY, MD   [facility] Providers Cardiologist:  Ozell Fell, MD        Patient Profile: [name] is a 83 y.o. female with a hx of AS s/p TAVR in 06/2023, CAD s/p 5v CABG in 2007, HTN, CHF, carotid artery disease, CVA in 2023, and hyperlipidemia who is being seen 09/12/2024 for the evaluation of elevated troponin at the request of the Emergency Department.  History of Present Illness: [name] was in her usual state of health eating when she suddenly felt acute onset chest pain.  She also felt her heart rate beating rapidly.  Symptoms persisted for an hour.  This prompted a call to EMS.  While in EMS, she was noted to be in an irregular fast heart rate with ventricular rates in the 150s.  She was given 10 mg IV Cardizem and 1 L of normal saline.  She converted to sinus rhythm while in the ER.  Afterwards, she stated her chest pain resolved, however she she felt off and developed generalized pain on her shoulders.  She states prior to this episode she felt like she was in her usual state of health.  Review systems is negative for syncope, shortness of breath, leg swelling, neurological symptoms.  [name] has no prior history of atrial fibrillation.  She has a history of 9 rheumatic aortic stenosis with a normal LV function.  She underwent a left heart cath for pre-TAVR evaluation.  Left heart cath showed 5 out of 5 patent grafts, CTO of the mid RCA she ultimately underwent a surgical 5 vessel CABG along with a transcatheter aortic valve replacement on 06/21/2023.    Notable labs include hsTnT 224->424, WBC 10.5.  CG shows sinus rhythm with PACs  Past Medical History:  Diagnosis Date   Allergy     SEASONAL   Anxiety    BREAST CANCER 1991   left, 1991; B mastectomy   CAD (coronary artery disease)    s/p CABG x5 in 2007    Cataract    BILATERAL-REMOVED   Depression    DIVERTICULOSIS    GERD    History of DVT (deep vein thrombosis)    HYPERLIPIDEMIA    Hypertension    HYPOTHYROIDISM    Occlusion and stenosis of carotid artery without mention of cerebral infarction    OSTEOPOROSIS    S/P TAVR (transcatheter aortic valve replacement) 06/21/2023   s/p TAVR with a 23mm Edwards S3UR via the TF approach by Dr. Verlin & Dr. Maryjane   Severe aortic stenosis     Past Surgical History:  Procedure Laterality Date   APPENDECTOMY     COLONOSCOPY     CORONARY ARTERY BYPASS GRAFT     5        2007   ENDARTERECTOMY FEMORAL Right 06/22/2023   Procedure: RIGHT FEMORAL ENDARTERECTOMY WITH PATCH ANGIOPLASTY;  Surgeon: Serene Gaile ORN, MD;  Location: [facility] OR;  Service: Vascular;  Laterality: Right;   FEMORAL-POPLITEAL BYPASS GRAFT Right 06/22/2023   Procedure: FEMORAL THROMBECTOMY;  Surgeon: Serene Gaile ORN, MD;  Location: [facility] OR;  Service: Vascular;  Laterality: Right;   INTRAOPERATIVE TRANSTHORACIC ECHOCARDIOGRAM N/A 06/21/2023   Procedure: INTRAOPERATIVE TRANSTHORACIC ECHOCARDIOGRAM;  Surgeon: Verlin Lonni BIRCH, MD;  Location: [facility];  Service: Open Heart Surgery;  Laterality: N/A;   MASTECTOMY     left  with reconstruction and lymph node excision  1992   MASTECTOMY     right with reconstruction   PATCH ANGIOPLASTY Right 06/22/2023   Procedure: PATCH ANGIOPLASTY OF RIGHT FEMORAL ARTERY USING BOVINE PERICARDIAL PATCH;  Surgeon: Serene Gaile ORN, MD;  Location: [facility] OR;  Service: Vascular;  Laterality: Right;   REVISION RECONSTRUCTED BREAST Left 02/2014   willard (plastics in [place])   right ankle arthroscopy     RIGHT/LEFT HEART CATH AND CORONARY ANGIOGRAPHY N/A 06/09/2023   Procedure: RIGHT/LEFT HEART CATH AND CORONARY ANGIOGRAPHY;  Surgeon: Verlin Lonni BIRCH, MD;  Location: [facility];  Service: Cardiovascular;  Laterality: N/A;   TEAR DUCT CANAL     TONSILLECTOMY AND ADENOIDECTOMY      TRANSCATHETER AORTIC VALVE REPLACEMENT, TRANSFEMORAL N/A 06/21/2023   Procedure: Transcatheter Aortic Valve Replacement, Transfemoral;  Surgeon: Verlin Lonni BIRCH, MD;  Location: [facility];  Service: Open Heart Surgery;  Laterality: N/A;   UPPER GASTROINTESTINAL ENDOSCOPY     VAGINAL HYSTERECTOMY     ovaries not excised     Home Medications:  Prior to Admission medications   Medication Sig Start Date End Date Taking? Authorizing Provider  alendronate  (FOSAMAX ) 70 MG tablet Take 1 tablet (70 mg total) by mouth every 7 (seven) days. Take with a full glass of water  on an empty stomach. 03/16/24   Geofm Glade PARAS, MD  aspirin  81 MG tablet Take 81 mg by mouth daily.      [provider]  atorvastatin  (LIPITOR) 20 MG tablet TAKE 1 TABLET BY MOUTH EVERY DAY 12/27/23   Geofm Glade PARAS, MD  Calcium  Carbonate (CALTRATE 600 PO) Take 1 tablet by mouth 2 (two) times daily.      [provider]  citalopram  (CELEXA ) 20 MG tablet TAKE 1 TABLET BY MOUTH EVERY DAY 03/19/24   Geofm Glade PARAS, MD  co-enzyme Q-10 30 MG capsule Take 30 mg by mouth daily.    [provider]  famotidine  (PEPCID ) 40 MG tablet TAKE 1 TABLET BY MOUTH EVERY DAY 02/24/24   Geofm Glade PARAS, MD  fluticasone  (FLONASE ) 50 MCG/ACT nasal spray Place 1 spray into both nostrils daily as needed for allergies or rhinitis. 10/12/22   Geofm Glade PARAS, MD  furosemide  (LASIX ) 20 MG tablet Take 20 mg by mouth daily. Patient not taking: Reported on 09/03/2024    [provider]  ipratropium (ATROVENT) 0.03 % nasal spray Place 2 sprays into both nostrils 3 (three) times daily with meals as needed for rhinitis. 08/31/24   Anice Riis, DO  levothyroxine  (SYNTHROID ) 75 MCG tablet TAKE 1 TABLET BY MOUTH EVERY MORNING BEFORE BREAKFAST 09/03/24   Geofm Glade PARAS, MD  losartan  (COZAAR ) 25 MG tablet Take 1 tablet (25 mg total) by mouth daily. 02/17/24   Geofm Glade PARAS, MD  meloxicam  (MOBIC ) 7.5 MG tablet TAKE 1/2 TO 1 TABLET  BY MOUTH EVERY DAY 07/02/24   Geofm Glade PARAS, MD  metoprolol  tartrate (LOPRESSOR ) 25 MG tablet TAKE 1 TABLET BY MOUTH TWICE A DAY 08/10/24   Geofm Glade PARAS, MD  Multiple Vitamins-Minerals (MULTIVITAMIN) LIQD Take 1 tablet by mouth daily. 11/05/21   [provider]  Multiple Vitamins-Minerals (PRESERVISION AREDS 2) CAPS Take 1 capsule by mouth 2 (two) times daily. Patient not taking: Reported on 09/03/2024    [provider]  senna-docusate (SENOKOT-S) 8.6-50 MG per tablet Take 1 tablet by mouth daily.      [provider]  VITAMIN D , CHOLECALCIFEROL, PO Take 1  tablet by mouth daily.    [provider]  zolpidem  (AMBIEN ) 5 MG tablet TAKE 1 TABLET (5 MG TOTAL) BY MOUTH EVERY DAY AT BEDTIME AS NEEDED FOR SLEEP 05/02/24   Geofm Glade PARAS, MD    Scheduled Meds:  acetaminophen   650 mg Oral Once   Continuous Infusions:  PRN Meds:   Allergies:    Allergies  Allergen Reactions   Clarithromycin Other (See Comments)    PATIENT UNSURE OF REACTION   Clindamycin Other (See Comments)   Codeine     REACTION: nausea   Levofloxacin     REACTION: nausea   Morphine Other (See Comments)    Nausea  Nausea    Naproxen     REACTION: nausea   Omeprazole  Other (See Comments)    Made her jittery, made her feel weird, ?rash   Penicillins Other (See Comments) and Rash    REACTION: RASH REACTION: RASH    Pneumococcal Vaccines Rash    Small rash on arm at injection site.     Social History:   Social History   Socioeconomic History   Marital status: Divorced    Spouse name: Not on file   Number of children: 2   Years of education: Not on file   Highest education level: Not on file  Occupational History   Occupation: retired  Tobacco Use   Smoking status: Never   Smokeless tobacco: Never  Vaping Use   Vaping status: Never Used  Substance and Sexual Activity   Alcohol use: No    Alcohol/week: 0.0 standard drinks of alcohol   Drug use: No   Sexual  activity: Not Currently  Other Topics Concern   Not on file  Social History Narrative   Divorced x 3, lives alone   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, school psychologist   Right handed    Social Drivers of Health   Financial Resource Strain: Low Risk  (01/23/2024)   Overall Financial Resource Strain (CARDIA)    Difficulty of Paying Living Expenses: Not hard at all  Food Insecurity: No Food Insecurity (01/23/2024)   Hunger Vital Sign    Worried About Running Out of Food in the Last Year: Never true    Ran Out of Food in the Last Year: Never true  Transportation Needs: No Transportation Needs (01/23/2024)   PRAPARE - Administrator, Civil Service (Medical): No    Lack of Transportation (Non-Medical): No  Physical Activity: Inactive (01/23/2024)   Exercise Vital Sign    Days of Exercise per Week: 0 days    Minutes of Exercise per Session: 0 min  Stress: Stress Concern Present (01/23/2024)   [employer] of Occupational Health - Occupational Stress Questionnaire    Feeling of Stress : To some extent  Social Connections: Moderately Integrated (01/23/2024)   Social Connection and Isolation Panel    Frequency of Communication with Friends and Family: More than three times a week    Frequency of Social Gatherings with Friends and Family: More than three times a week    Attends Religious Services: More than 4 times per year    Active Member of [employer] or Organizations: Yes    Attends Banker Meetings: Never    Marital Status: Divorced  Catering Manager Violence: Not At Risk (06/25/2023)   Humiliation, Afraid, Rape, and Kick questionnaire    Fear of Current or Ex-Partner: No    Emotionally Abused: No    Physically Abused:  No    Sexually Abused: No    Family History:    Family History  Problem Relation Age of Onset   Colon cancer Sister    Colon cancer Sister    Breast cancer Mother    Lung disease Mother    Emphysema Father    Esophageal  cancer Neg Hx    Rectal cancer Neg Hx    Stomach cancer Neg Hx    Thyroid  disease Neg Hx      ROS:  Please see the history of present illness.   All other ROS reviewed and negative.     Physical Exam/Data: Vitals:   09/11/24 2300 09/11/24 2330 09/12/24 0000 09/12/24 0115  BP: (!) 161/53 (!) 160/62 (!) 161/71 (!) 166/52  Pulse: (!) 56 (!) 59 (!) 39 (!) 32  Resp: (!) 21 20 16 16   Temp:      TempSrc:      SpO2: 99% 97% 100% 97%  Weight:      Height:        Intake/Output Summary (Last 24 hours) at 09/12/2024 0123 Last data filed at 09/12/2024 0051 Gross per 24 hour  Intake --  Output 600 ml  Net -600 ml      09/11/2024    9:39 PM 09/03/2024    2:36 PM 08/31/2024    1:38 PM  Last 3 Weights  Weight (lbs) 115 lb 115 lb 115 lb  Weight (kg) 52.164 kg 52.164 kg 52.164 kg     Body mass index is 19.74 kg/m.  General:  Well nourished, well developed, in no acute distress HEENT: normal Neck: no JVD Vascular: No carotid bruits; Distal pulses 2+ bilaterally Cardiac:  normal S1, S2; RRR; no murmur  Lungs:  clear to auscultation bilaterally, no wheezing, rhonchi or rales  Abd: soft, nontender, no hepatomegaly  Ext: no edema Musculoskeletal:  No deformities, BUE and BLE strength normal and equal Skin: warm and dry  Neuro:  CNs 2-12 intact, no focal abnormalities noted Psych:  Normal affect   EKG:  The EKG was personally reviewed and demonstrates:  sinus rhythm with PACS Telemetry:  Telemetry was personally reviewed and demonstrates:    Relevant CV Studies: Reviewed   Laboratory Data: High Sensitivity Troponin:   Recent Labs  Lab 09/11/24 2153 09/11/24 2352  TROPONINIHS 40* 225*     Chemistry Recent Labs  Lab 09/11/24 2153  NA 134*  K 4.1  CL 104  CO2 23  GLUCOSE 154*  BUN 14  CREATININE 0.66  CALCIUM  7.9*  GFRNONAA >60  ANIONGAP 7    Recent Labs  Lab 09/11/24 2153  PROT 5.8*  ALBUMIN 3.3*  AST 43*  ALT 31  ALKPHOS 57  BILITOT 0.9   Lipids No  results for input(s): CHOL, TRIG, HDL, LABVLDL, LDLCALC, CHOLHDL in the last 168 hours.  Hematology Recent Labs  Lab 09/11/24 2153  WBC 10.5  RBC 4.21  HGB 12.3  HCT 38.8  MCV 92.2  MCH 29.2  MCHC 31.7  RDW 14.6  PLT 245   Thyroid  No results for input(s): TSH, FREET4 in the last 168 hours.  BNP Recent Labs  Lab 09/11/24 2153  BNP 424.3*    DDimer No results for input(s): DDIMER in the last 168 hours.  Radiology/Studies:  DG Chest Portable 1 View Result Date: 09/11/2024 EXAM: 1 VIEW(S) XRAY OF THE CHEST 09/11/2024 10:05:00 PM COMPARISON: PA and lateral chest 03/16/2024. CLINICAL HISTORY: cp. Triage notes: Arrives [facility] from home with concern of sudden generalized  chest pains while cooking dinner. Sat down and said something just feels wrong. Upon EMS arrival pt found afiv-rvr with with rate of 180 and no known history of. 10mg  Diltiazem IVP \\T \ 1000cc NS admin prior to arrival. Currently pain free. FINDINGS: LINES, TUBES AND DEVICES: Sternotomy and CABG changes, TAVR are again noted. Bilateral axillary surgical clips. There are artifacts from overlying clothing and wiring. LUNGS AND PLEURA: Generalized interstitial edema with a basal gradient. Minimal pleural effusions are beginning to form. No acute airspace infiltrate is seen. No focal pulmonary opacity. No pneumothorax. HEART AND MEDIASTINUM: Mild cardiomegaly. Interval increased central vascular congestion. The mediastinum is stable with calcification and tortuosity in the aorta. BONES AND SOFT TISSUES: Levoscoliosis in the lower thoracic spine, osteopenia, and degenerative changes of the spine. No new osseous findings. IMPRESSION: 1. Interval increased central vascular congestion and generalized interstitial edema with a basal gradient. Findings consistent with CHF or fluid overload. 2. Minimal pleural effusions beginning to form. 3. Mild cardiomegaly. Electronically signed by: Francis Quam MD 09/11/2024 10:19  PM EDT RP Workstation: [id number]     Assessment and Plan:  [name] is a 83 y.o. female with a hx of AS s/p TAVR in 06/2023, CAD s/p 5v CABG in 2007, HTN, CHF, carotid artery disease, CVA in 2023, and hyperlipidemia who is being seen 09/12/2024 for the evaluation of elevated troponin at the request of the Emergency Department.  Patient has acute myocardial injury likely in the setting of rapid atrial fibrillation.  Her chest pain has resolved since she has been in sinus rhythm, ever she still has lingering noncardiac symptoms.  Will continue to monitor.  Risk factors include advanced age, prior CVA, stents to cardiovascular disease, immature ectopic atrial beats.  Her left atrial size is normal.  Her CHADS2Vasc score is 8, but her AF burden is unclear. Will start IV heparin  tonight, she may need to transition to oral anticoagulation.   IV heparin  Continue home metoprolol  tartrate May need holter monitor prior to discharge   Risk Assessment/Risk Scores:    TIMI Risk Score for Unstable Angina or Non-ST Elevation MI:   The patient's TIMI risk score is  , which indicates a  % risk of all cause mortality, new or recurrent myocardial infarction or need for urgent revascularization in the next 14 days.    CHA2DS2-VASc Score =     This indicates a  % annual risk of stroke. The patient's score is based upon:          For questions or updates, please contact [facility] Please consult [email or web address] for contact info under      Signed, Tahisha Hakim A Rielly Brunn, MD  09/12/2024 1:23 AM

## 2024-09-12 NOTE — ED Provider Notes (Signed)
 Care assumed at 2300.  Patient with history of aortic stenosis status post TAVR, coronary artery disease, CHF here for evaluation of chest pain.  Patient was in new onset A-fib with RVR for EMS and was given 10 mg of Cardizem with return to sinus rhythm.  Care assumed pending repeat troponin.  Repeat troponin has significantly risen since initial from 42-225.  Patient is chest pain-free but does complain of aching to her shoulders.  Current EKG is nonischemic.  Discussed with Dr. Gail with cardiology-recommendation for heparin  drip, medicine admission with cardiology consult.  Medicine consulted for admission for ongoing care.  CRITICAL CARE Performed by: Almarie Burner   Total critical care time: 35 minutes  Critical care time was exclusive of separately billable procedures and treating other patients.  Critical care was necessary to treat or prevent imminent or life-threatening deterioration.  Critical care was time spent personally by me on the following activities: development of treatment plan with patient and/or surrogate as well as nursing, discussions with consultants, evaluation of patient's response to treatment, examination of patient, obtaining history from patient or surrogate, ordering and performing treatments and interventions, ordering and review of laboratory studies, ordering and review of radiographic studies, pulse oximetry and re-evaluation of patient's condition.    Burner Almarie, MD 09/12/24 [phone]

## 2024-09-12 NOTE — ED Notes (Signed)
 CCMD called, pt placed on monitor.

## 2024-09-12 NOTE — ED Notes (Signed)
 Pt transferred to hospital bed. Pt cleaned and changed before transfer.

## 2024-09-12 NOTE — ED Notes (Signed)
 This patient's second troponin increased to 225. Dr. Griselda informed.

## 2024-09-12 NOTE — Discharge Summary (Signed)
 Physician Discharge Summary   Patient: [name] MRN: [id number] DOB: [birth year]  Admit date:     09/11/2024  Discharge date: 09/12/24  Discharge Physician: Lonni SHAUNNA Dalton   PCP: Geofm Glade JINNY, MD     Recommendations at discharge:  Follow up with PCP Dr. Geofm in 1 week for new Afib Follow up with Cardiology Dr. Barbaraann in 2-4 weeks for new Afib on Eliquis Dr. Geofm: Please check BP and adjust antihypertensives as appropriate     Discharge Diagnoses: Principal Problem:   New onset atrial fibrillation [facility]) Active Problems: Myocardial injury due to atrial fibrillation Myocardial infarction ruled out History of aortic stenosis status post TAVR Coronary artery disease Carotid artery disease Acute on chronic diastolic congestive heart failure Hypertension History of stroke Hyperlipidemia     Hospital Course: [name] with CAD s/p CABG, AS s/p TAVR, dCHF, PVD, and hx CVA who presented with sudden chest pressure.  EMS found her with Afib rates 150s, administered IV diltiazem and she reverted to sinus rhythm.      New onset atrial fibrillation, paroxysmal Patient was treated with IV diltiazem by EMS and chemically converted back to sinus rhythm.  She remained rate controlled overnight, rates in the high 50s on her home metoprolol .    CHA2DS2-VASc 7.  TSH normal.  Ischemia ruled out.  Echocardiogram showed normal EF, normal valves.  Started on Eliquis, lower dose given age and weight.  Has cardiology follow-up in 2 to 4 weeks.   Coronary artery disease Myocardial infarction ruled out Admitted and troponin minimally elevated, peak 570.  EKG showed no ST depressions.  Echocardiogram showed normal EF, no regional wall motion abnormalities.  Cardiology were consulted, recommended no further ischemic workup. - Follow-up with cardiology in 2 to 4 weeks  Acute on chronic diastolic congestive heart failure Patient had mild CHF triggered by A-fib.  Cardiology  treated with IV Lasix  x 1.  Echocardiogram showed preserved EF.  Given her preserved EF will limit new medications at present and defer adding spironolactone to PCP.           [place]  Controlled Substances Registry was reviewed for this patient prior to discharge.  Consultants: Cardiology Procedures performed: Echocardiogram Disposition: Home Diet recommendation:  Cardiac diet  DISCHARGE MEDICATION: Allergies as of 09/12/2024       Reactions   Clarithromycin Other (See Comments)   PATIENT UNSURE OF REACTION   Clindamycin    Unknown reaction   Codeine Nausea Only   Levofloxacin Nausea Only   Morphine Other (See Comments)   Nausea Nausea   Naproxen    REACTION: nausea   Omeprazole  Other (See Comments)   Made her jittery, made her feel weird, ?rash   Penicillins Rash      Pneumococcal Vaccines Rash   Small rash on arm at injection site.         Medication List     STOP taking these medications    aspirin  81 MG tablet       TAKE these medications    alendronate  70 MG tablet Commonly known as: FOSAMAX  Take 1 tablet (70 mg total) by mouth every 7 (seven) days. Take with a full glass of water  on an empty stomach.   atorvastatin  20 MG tablet Commonly known as: LIPITOR TAKE 1 TABLET BY MOUTH EVERY DAY   CALTRATE 600 PO Take 1 tablet by mouth 2 (two) times daily.   chlorpheniramine 4 MG tablet Commonly known as: CHLOR-TRIMETON Take 2 mg  by mouth in the morning.   citalopram  20 MG tablet Commonly known as: CELEXA  TAKE 1 TABLET BY MOUTH EVERY DAY   co-enzyme Q-10 30 MG capsule Take 30 mg by mouth daily.   Eliquis 2.5 MG Tabs tablet Generic drug: apixaban Take 1 tablet (2.5 mg total) by mouth 2 (two) times daily.   famotidine  40 MG tablet Commonly known as: PEPCID  TAKE 1 TABLET BY MOUTH EVERY DAY   fluticasone  50 MCG/ACT nasal spray Commonly known as: FLONASE  Place 1 spray into both nostrils daily as needed for allergies or  rhinitis.   ipratropium 0.03 % nasal spray Commonly known as: ATROVENT Place 2 sprays into both nostrils 3 (three) times daily with meals as needed for rhinitis.   levothyroxine  75 MCG tablet Commonly known as: SYNTHROID  TAKE 1 TABLET BY MOUTH EVERY MORNING BEFORE BREAKFAST   losartan  25 MG tablet Commonly known as: COZAAR  Take 1 tablet (25 mg total) by mouth daily.   metoprolol  tartrate 25 MG tablet Commonly known as: LOPRESSOR  TAKE 1 TABLET BY MOUTH TWICE A DAY   PreserVision AREDS 2 Caps Take 1 capsule by mouth 2 (two) times daily.   Multivitamin Liqd Take 1 tablet by mouth daily.   senna-docusate 8.6-50 MG tablet Commonly known as: Senokot-S Take 1 tablet by mouth daily.   VITAMIN D  (CHOLECALCIFEROL) PO Take 1 tablet by mouth daily.   zolpidem  5 MG tablet Commonly known as: AMBIEN  TAKE 1 TABLET (5 MG TOTAL) BY MOUTH EVERY DAY AT BEDTIME AS NEEDED FOR SLEEP        Follow-up Information     Geofm Glade PARAS, MD. Schedule an appointment as soon as possible for a visit in 1 week(s).   Specialty: Internal Medicine Contact information: [address] [place] [address] [phone]         Barbaraann Darryle Ned, MD Follow up.   Specialties: Cardiology, Internal Medicine, Radiology Contact information: [address] [place] [address] [phone]                 Discharge Instructions     Diet - low sodium heart healthy   Complete by: As directed    Discharge instructions   Complete by: As directed    **IMPORTANT DISCHARGE INSTRUCTIONS**   From Dr. Jonel: You were evaluated for chest discomfort EMS found that you had an episode of atrial fibrillation or Afib  You were evaluated by our Cardiologist here, and they determined that you had not had a heart attack, thankfully  You had an echocardiogram (ultrasound of the heart) that was normal  To prevent the heart from going fast again: Continue your metoprolol  25 mg  twice daily and make sure not to miss doses  To prevent strokes related to atrial fibrillation: Take the blood thinner apixaban/Eliquis 2.5 mg twice daily from now on  Go see the Cardiology team in 2-4 weeks They will contact you this week for an appointment. IF you haven't heard from someone in 5 days, call the number listed below for the Cardiologist who saw you in the hospital, Dr. Barbaraann, explain that you were expecting a follow up appointment  Go see Dr. Geofm in 1 week   Increase activity slowly   Complete by: As directed        Discharge Exam: Filed Weights   09/11/24 2139  Weight: 52.2 kg    General: Pt is alert, awake, not in acute distress, tired Cardiovascular: Slow, regular, occasional premature beats, nl S1-S2, no murmurs  appreciated.   No LE edema.   Respiratory: Normal respiratory rate and rhythm.  CTAB without rales or wheezes. Abdominal: Abdomen soft and non-tender.  No distension or HSM.   Neuro/Psych: Strength symmetric in upper and lower extremities.  Judgment and insight appear normal, very hard of hearing, tired.   Condition at discharge: fair  The results of significant diagnostics from this hospitalization (including imaging, microbiology, ancillary and laboratory) are listed below for reference.   Imaging Studies: ECHOCARDIOGRAM LIMITED Result Date: 09/12/2024    ECHOCARDIOGRAM LIMITED REPORT   Patient Name:   [name] Date of Exam: 09/12/2024 Medical Rec #:  [id number]     Height:       64.0 in Accession #:    [id number]    Weight:       115.0 lb Date of Birth:  [birth year]      BSA:          1.546 m Patient Age:    83 years      BP:           154/56 mmHg Patient Gender: F             HR:           38 bpm. Exam Location:  Inpatient Procedure: Limited Echo, Cardiac Doppler and Color Doppler (Both Spectral and            Color Flow Doppler were utilized during procedure). Indications:    NSTEMI  History:        Patient has prior history of Echocardiogram  examinations, most                 recent 07/05/2024. CHF, CAD, Prior CABG; Risk                 Factors:Hypertension and Dyslipidemia.                 Aortic Valve: 23 mm Sapien prosthetic, stented (TAVR) valve is                 present in the aortic position. Procedure Date: 06/21/23.  Sonographer:    Philomena Daring Referring Phys: [id number] [facility]  Sonographer Comments: Suboptimal apical window and Technically challenging study due to limited acoustic windows. Image acquisition challenging due to patient body habitus and Image acquisition challenging due to breast implants. IMPRESSIONS  1. Limited echo  2. Left ventricular ejection fraction, by estimation, is 60 to 65%. Left ventricular ejection fraction by PLAX is 60 %. The left ventricle has normal function. The left ventricle has no regional wall motion abnormalities. There is mild left ventricular hypertrophy. Left ventricular diastolic parameters are indeterminate.  3. Right ventricular systolic function is normal. The right ventricular size is normal.  4. Aortic valve gradients were not obtained. The aortic valve has been repaired/replaced. Aortic valve regurgitation is not visualized. There is a 23 mm Sapien prosthetic (TAVR) valve present in the aortic position. Procedure Date: 06/21/23.  5. The pulmonic valve was abnormal.  6. The inferior vena cava is normal in size with greater than 50% respiratory variability, suggesting right atrial pressure of 3 mmHg. Comparison(s): Changes from prior study are noted. 07/05/2024: LVEF 55-60%. FINDINGS  Left Ventricle: Left ventricular ejection fraction, by estimation, is 60 to 65%. Left ventricular ejection fraction by PLAX is 60 %. The left ventricle has normal function. The left ventricle has no regional wall motion abnormalities. The left ventricular internal cavity size was normal in size.  There is mild left ventricular hypertrophy. Left ventricular diastolic parameters are indeterminate. Right Ventricle:  The right ventricular size is normal. No increase in right ventricular wall thickness. Right ventricular systolic function is normal. Aortic Valve: Aortic valve gradients were not obtained. The aortic valve has been repaired/replaced. Aortic valve regurgitation is not visualized. There is a 23 mm Sapien prosthetic, stented (TAVR) valve present in the aortic position. Procedure Date: 06/21/23. Pulmonic Valve: The pulmonic valve was abnormal. Pulmonic valve regurgitation is mild. Venous: The inferior vena cava is normal in size with greater than 50% respiratory variability, suggesting right atrial pressure of 3 mmHg. Additional Comments: Spectral Doppler performed. Color Doppler performed.  LEFT VENTRICLE PLAX 2D LV EF:         Left ventricular ejection fraction by PLAX is 60 %. LVIDd:         2.90 cm LVIDs:         2.00 cm LV PW:         1.00 cm LV IVS:        1.20 cm LVOT diam:     2.00 cm LVOT Area:     3.14 cm  IVC IVC diam: 1.60 cm LEFT ATRIUM         Index LA diam:    2.90 cm 1.88 cm/m   AORTA Ao Root diam: 2.60 cm  SHUNTS Systemic Diam: 2.00 cm Vinie Maxcy MD Electronically signed by Vinie Maxcy MD Signature Date/Time: 09/12/2024/3:34:43 PM    Final    DG Chest Portable 1 View Result Date: 09/11/2024 EXAM: 1 VIEW(S) XRAY OF THE CHEST 09/11/2024 10:05:00 PM COMPARISON: PA and lateral chest 03/16/2024. CLINICAL HISTORY: cp. Triage notes: Arrives [facility] from home with concern of sudden generalized chest pains while cooking dinner. Sat down and said something just feels wrong. Upon EMS arrival pt found afiv-rvr with with rate of 180 and no known history of. 10mg  Diltiazem IVP \\T \ 1000cc NS admin prior to arrival. Currently pain free. FINDINGS: LINES, TUBES AND DEVICES: Sternotomy and CABG changes, TAVR are again noted. Bilateral axillary surgical clips. There are artifacts from overlying clothing and wiring. LUNGS AND PLEURA: Generalized interstitial edema with a basal gradient. Minimal pleural  effusions are beginning to form. No acute airspace infiltrate is seen. No focal pulmonary opacity. No pneumothorax. HEART AND MEDIASTINUM: Mild cardiomegaly. Interval increased central vascular congestion. The mediastinum is stable with calcification and tortuosity in the aorta. BONES AND SOFT TISSUES: Levoscoliosis in the lower thoracic spine, osteopenia, and degenerative changes of the spine. No new osseous findings. IMPRESSION: 1. Interval increased central vascular congestion and generalized interstitial edema with a basal gradient. Findings consistent with CHF or fluid overload. 2. Minimal pleural effusions beginning to form. 3. Mild cardiomegaly. Electronically signed by: Francis Quam MD 09/11/2024 10:19 PM EDT RP Workstation: [id number]    Microbiology: Results for orders placed or performed during the hospital encounter of 06/17/23  Surgical pcr screen     Status: None   Collection Time: 06/17/23 10:36 AM   Specimen: Nasal Mucosa; Nasal Swab  Result Value Ref Range Status   MRSA, PCR NEGATIVE NEGATIVE Final   Staphylococcus aureus NEGATIVE NEGATIVE Final    Comment: (NOTE) The Xpert SA Assay (FDA approved for NASAL specimens in patients 6 years of age and older), is one component of a comprehensive surveillance program. It is not intended to diagnose infection nor to guide or monitor treatment. Performed at [facility], [address], [place], [place] [address]   SARS  CORONAVIRUS 2 (TAT 6-24 HRS) Anterior Nasal Swab     Status: None   Collection Time: 06/17/23 10:36 AM   Specimen: Anterior Nasal Swab  Result Value Ref Range Status   SARS Coronavirus 2 NEGATIVE NEGATIVE Final    Comment: (NOTE) SARS-CoV-2 target nucleic acids are NOT DETECTED.  The SARS-CoV-2 RNA is generally detectable in upper and lower respiratory specimens during the acute phase of infection. Negative results do not preclude SARS-CoV-2 infection, do not rule out co-infections with other pathogens,  and should not be used as the sole basis for treatment or other patient management decisions. Negative results must be combined with clinical observations, patient history, and epidemiological information. The expected result is Negative.  Fact Sheet for Patients: hairslick.no  Fact Sheet for Healthcare Providers: quierodirigir.com  This test is not yet approved or cleared by the [place]  FDA and  has been authorized for detection and/or diagnosis of SARS-CoV-2 by FDA under an Emergency Use Authorization (EUA). This EUA will remain  in effect (meaning this test can be used) for the duration of the COVID-19 declaration under Se ction 564(b)(1) of the Act, 21 U.S.C. section 360bbb-3(b)(1), unless the authorization is terminated or revoked sooner.  Performed at [facility], [address], , [place] [address]    *Note: Due to a large number of results and/or encounters for the requested time period, some results have not been displayed. A complete set of results can be found in Results Review.    Labs: CBC: Recent Labs  Lab 09/11/24 2153 09/12/24 0323  WBC 10.5 8.3  NEUTROABS 7.4  --   HGB 12.3 11.2*  HCT 38.8 34.6*  MCV 92.2 89.6  PLT 245 222   Basic Metabolic Panel: Recent Labs  Lab 09/11/24 2153 09/12/24 0323  NA 134* 139  K 4.1 3.7  CL 104 107  CO2 23 24  GLUCOSE 154* 106*  BUN 14 11  CREATININE 0.66 0.60  CALCIUM  7.9* 8.6*  MG  --  2.0   Liver Function Tests: Recent Labs  Lab 09/11/24 2153  AST 43*  ALT 31  ALKPHOS 57  BILITOT 0.9  PROT 5.8*  ALBUMIN 3.3*   CBG: No results for input(s): GLUCAP in the last 168 hours.  Discharge time spent: approximately 45 minutes spent on discharge counseling, evaluation of patient on day of discharge, and coordination of discharge planning with nursing, social work, pharmacy and case management  Signed: Lonni SHAUNNA Dalton, MD [facility] 09/12/2024

## 2024-09-12 NOTE — TOC CM/SW Note (Signed)
 17:15 - Arrived at bedside to deliver Code 44 and pt had been discharged.

## 2024-09-12 NOTE — H&P (Signed)
 History and Physical  [name] FMW:[id number] DOB: 22-May-1941 DOA: 09/11/2024  PCP: Geofm Glade JINNY, MD   Chief Complaint: Chest pain  HPI: [name] is a 83 y.o. female with medical history significant for AS s/p TAVR in 06/2023, CAD s/p 5v CABG in 2007, HTN, chronic diastolic HF, carotid artery disease, CVA in 2023, and HLD presented to the ED for evaluation of chest pain.  Patient reports she was in her kitchen in the evening when she suddenly started having centralized chest pain described as pressure that was radiating to both shoulders.  She also reports associated palpitations and feeling weird.  She was able to sit down and called EMS. During evaluation, reports chest pain and shoulder pain has resolved but she feels fatigue and has mild shortness of breath.  She denies any dizziness, headache, fever, chills, abdominal pain, nausea, vomiting or dysuria. She does report urinary incontinence that is chronic.  Per EMS report, patient was found to be in A-fib with rapid ventricular response, HR in the 150s.  The administers IV Cardizem 10 mg and gave 1 L IV fluid.  Patient converted back to normal sinus prior to arrival to the ER.  ED Course: Initial vitals show afebrile, RR 16-21, HR 50-60s, SBP 130-160s, SpO2 100% on room air. Initial labs significant for troponin 40->225, BNP 424, otherwise normal renal function and CBC. EKG shows sinus rhythm with APCs. CXR shows cardiomegaly, vascular congestion interstitial edema. Pt was started on IV heparin .  Cardiology was consulted for evaluation. TRH was consulted for admission.   Review of Systems: Please see HPI for pertinent positives and negatives. A complete 10 system review of systems are otherwise negative.  Past Medical History:  Diagnosis Date   Allergy     SEASONAL   Anxiety    BREAST CANCER 1991   left, 1991; B mastectomy   CAD (coronary artery disease)    s/p CABG x5 in 2007   Cataract    BILATERAL-REMOVED   Depression     DIVERTICULOSIS    GERD    History of DVT (deep vein thrombosis)    HYPERLIPIDEMIA    Hypertension    HYPOTHYROIDISM    Occlusion and stenosis of carotid artery without mention of cerebral infarction    OSTEOPOROSIS    S/P TAVR (transcatheter aortic valve replacement) 06/21/2023   s/p TAVR with a 23mm Edwards S3UR via the TF approach by Dr. Verlin & Dr. Maryjane   Severe aortic stenosis    Past Surgical History:  Procedure Laterality Date   APPENDECTOMY     COLONOSCOPY     CORONARY ARTERY BYPASS GRAFT     5        2007   ENDARTERECTOMY FEMORAL Right 06/22/2023   Procedure: RIGHT FEMORAL ENDARTERECTOMY WITH PATCH ANGIOPLASTY;  Surgeon: Serene Gaile ORN, MD;  Location: [facility] OR;  Service: Vascular;  Laterality: Right;   FEMORAL-POPLITEAL BYPASS GRAFT Right 06/22/2023   Procedure: FEMORAL THROMBECTOMY;  Surgeon: Serene Gaile ORN, MD;  Location: [facility] OR;  Service: Vascular;  Laterality: Right;   INTRAOPERATIVE TRANSTHORACIC ECHOCARDIOGRAM N/A 06/21/2023   Procedure: INTRAOPERATIVE TRANSTHORACIC ECHOCARDIOGRAM;  Surgeon: Verlin Lonni BIRCH, MD;  Location: [facility] INVASIVE [facility];  Service: Open Heart Surgery;  Laterality: N/A;   MASTECTOMY     left with reconstruction and lymph node excision  1992   MASTECTOMY     right with reconstruction   PATCH ANGIOPLASTY Right 06/22/2023   Procedure: PATCH ANGIOPLASTY OF RIGHT FEMORAL ARTERY USING BOVINE PERICARDIAL  PATCH;  Surgeon: Serene Gaile ORN, MD;  Location: [facility] OR;  Service: Vascular;  Laterality: Right;   REVISION RECONSTRUCTED BREAST Left 02/2014   willard (plastics in [place])   right ankle arthroscopy     RIGHT/LEFT HEART CATH AND CORONARY ANGIOGRAPHY N/A 06/09/2023   Procedure: RIGHT/LEFT HEART CATH AND CORONARY ANGIOGRAPHY;  Surgeon: Verlin Lonni BIRCH, MD;  Location: [facility];  Service: Cardiovascular;  Laterality: N/A;   TEAR DUCT CANAL     TONSILLECTOMY AND ADENOIDECTOMY     TRANSCATHETER AORTIC VALVE REPLACEMENT, TRANSFEMORAL N/A  06/21/2023   Procedure: Transcatheter Aortic Valve Replacement, Transfemoral;  Surgeon: Verlin Lonni BIRCH, MD;  Location: [facility];  Service: Open Heart Surgery;  Laterality: N/A;   UPPER GASTROINTESTINAL ENDOSCOPY     VAGINAL HYSTERECTOMY     ovaries not excised   Social History:  reports that she has never smoked. She has never used smokeless tobacco. She reports that she does not drink alcohol and does not use drugs.  Allergies  Allergen Reactions   Clarithromycin Other (See Comments)    PATIENT UNSURE OF REACTION   Clindamycin     Unknown reaction   Codeine Nausea Only   Levofloxacin Nausea Only   Morphine Other (See Comments)    Nausea  Nausea    Naproxen     REACTION: nausea   Omeprazole  Other (See Comments)    Made her jittery, made her feel weird, ?rash   Penicillins Rash        Pneumococcal Vaccines Rash    Small rash on arm at injection site.     Family History  Problem Relation Age of Onset   Colon cancer Sister    Colon cancer Sister    Breast cancer Mother    Lung disease Mother    Emphysema Father    Esophageal cancer Neg Hx    Rectal cancer Neg Hx    Stomach cancer Neg Hx    Thyroid  disease Neg Hx      Prior to Admission medications   Medication Sig Start Date End Date Taking? Authorizing Provider  alendronate  (FOSAMAX ) 70 MG tablet Take 1 tablet (70 mg total) by mouth every 7 (seven) days. Take with a full glass of water  on an empty stomach. 03/16/24   Geofm Glade PARAS, MD  aspirin  81 MG tablet Take 81 mg by mouth daily.      [provider]  atorvastatin  (LIPITOR) 20 MG tablet TAKE 1 TABLET BY MOUTH EVERY DAY 12/27/23   Geofm Glade PARAS, MD  Calcium  Carbonate (CALTRATE 600 PO) Take 1 tablet by mouth 2 (two) times daily.      [provider]  citalopram  (CELEXA ) 20 MG tablet TAKE 1 TABLET BY MOUTH EVERY DAY 03/19/24   Geofm Glade PARAS, MD  co-enzyme Q-10 30 MG capsule Take 30 mg by mouth daily.    [provider]   famotidine  (PEPCID ) 40 MG tablet TAKE 1 TABLET BY MOUTH EVERY DAY 02/24/24   Geofm Glade PARAS, MD  fluticasone  (FLONASE ) 50 MCG/ACT nasal spray Place 1 spray into both nostrils daily as needed for allergies or rhinitis. 10/12/22   Geofm Glade PARAS, MD  furosemide  (LASIX ) 20 MG tablet Take 20 mg by mouth daily. Patient not taking: Reported on 09/03/2024    [provider]  ipratropium (ATROVENT) 0.03 % nasal spray Place 2 sprays into both nostrils 3 (three) times daily with meals as needed for rhinitis. 08/31/24   Anice Riis, DO  levothyroxine  (SYNTHROID ) 75 MCG tablet TAKE 1 TABLET BY MOUTH EVERY MORNING BEFORE BREAKFAST 09/03/24   Geofm Glade PARAS, MD  losartan  (COZAAR ) 25 MG tablet Take 1 tablet (25 mg total) by mouth daily. 02/17/24   Geofm Glade PARAS, MD  meloxicam  (MOBIC ) 7.5 MG tablet TAKE 1/2 TO 1 TABLET BY MOUTH EVERY DAY 07/02/24   Geofm Glade PARAS, MD  metoprolol  tartrate (LOPRESSOR ) 25 MG tablet TAKE 1 TABLET BY MOUTH TWICE A DAY 08/10/24   Geofm Glade PARAS, MD  Multiple Vitamins-Minerals (MULTIVITAMIN) LIQD Take 1 tablet by mouth daily. 11/05/21   [provider]  Multiple Vitamins-Minerals (PRESERVISION AREDS 2) CAPS Take 1 capsule by mouth 2 (two) times daily. Patient not taking: Reported on 09/03/2024    [provider]  senna-docusate (SENOKOT-S) 8.6-50 MG per tablet Take 1 tablet by mouth daily.      [provider]  VITAMIN D , CHOLECALCIFEROL, PO Take 1 tablet by mouth daily.    [provider]  zolpidem  (AMBIEN ) 5 MG tablet TAKE 1 TABLET (5 MG TOTAL) BY MOUTH EVERY DAY AT BEDTIME AS NEEDED FOR SLEEP 05/02/24   Geofm Glade PARAS, MD    Physical Exam: BP (!) 161/58   Pulse 60   Temp 97.8 F (36.6 C) (Oral)   Resp 16   Ht 5' 4 (1.626 m)   Wt 52.2 kg   SpO2 97%   BMI 19.74 kg/m  General: Pleasant, chronically ill elderly woman laying in bed. No acute distress. HEENT: [place]/AT. Anicteric sclera CV: Regular rate. Irregular rhythm. No murmurs,  rubs, or gallops. No LE edema Pulmonary: Lungs CTAB. Normal effort. Rales in the middle and lower lobes. Abdominal: Soft, nontender, nondistended. Normal bowel sounds. Extremities: Palpable radial and DP pulses. Normal ROM. Skin: Warm and dry. No obvious rash or lesions. Neuro: A&Ox3. Moves all extremities. Normal sensation to light touch. No focal deficit. Psych: Normal mood and affect          Labs on Admission:  Basic Metabolic Panel: Recent Labs  Lab 09/11/24 2153 09/12/24 0323  NA 134* 139  K 4.1 3.7  CL 104 107  CO2 23 24  GLUCOSE 154* 106*  BUN 14 11  CREATININE 0.66 0.60  CALCIUM  7.9* 8.6*  MG  --  2.0   Liver Function Tests: Recent Labs  Lab 09/11/24 2153  AST 43*  ALT 31  ALKPHOS 57  BILITOT 0.9  PROT 5.8*  ALBUMIN 3.3*   No results for input(s): LIPASE, AMYLASE in the last 168 hours. No results for input(s): AMMONIA in the last 168 hours. CBC: Recent Labs  Lab 09/11/24 2153 09/12/24 0323  WBC 10.5 8.3  NEUTROABS 7.4  --   HGB 12.3 11.2*  HCT 38.8 34.6*  MCV 92.2 89.6  PLT 245 222   Cardiac Enzymes: No results for input(s): CKTOTAL, CKMB, CKMBINDEX, TROPONINI in the last 168 hours. BNP (last 3 results) Recent Labs    09/11/24 2153  BNP 424.3*    ProBNP (last 3 results) No results for input(s): PROBNP in the last 8760 hours.  CBG: No results for input(s): GLUCAP in the last 168 hours.  Radiological Exams on Admission: DG Chest Portable 1 View Result Date: 09/11/2024 EXAM: 1 VIEW(S) XRAY OF THE CHEST 09/11/2024 10:05:00 PM COMPARISON: PA and lateral chest 03/16/2024. CLINICAL HISTORY: cp. Triage notes: Arrives [facility] from home with concern of sudden generalized chest pains while cooking dinner. Sat down and said something just feels wrong. Upon EMS arrival pt found afiv-rvr  with with rate of 180 and no known history of. 10mg  Diltiazem IVP \\T \ 1000cc NS admin prior to arrival. Currently pain free. FINDINGS: LINES, TUBES  AND DEVICES: Sternotomy and CABG changes, TAVR are again noted. Bilateral axillary surgical clips. There are artifacts from overlying clothing and wiring. LUNGS AND PLEURA: Generalized interstitial edema with a basal gradient. Minimal pleural effusions are beginning to form. No acute airspace infiltrate is seen. No focal pulmonary opacity. No pneumothorax. HEART AND MEDIASTINUM: Mild cardiomegaly. Interval increased central vascular congestion. The mediastinum is stable with calcification and tortuosity in the aorta. BONES AND SOFT TISSUES: Levoscoliosis in the lower thoracic spine, osteopenia, and degenerative changes of the spine. No new osseous findings. IMPRESSION: 1. Interval increased central vascular congestion and generalized interstitial edema with a basal gradient. Findings consistent with CHF or fluid overload. 2. Minimal pleural effusions beginning to form. 3. Mild cardiomegaly. Electronically signed by: Francis Quam MD 09/11/2024 10:19 PM EDT RP Workstation: [id number]   Assessment/Plan [name] is a 83 y.o. female with medical history significant for AS s/p TAVR in 06/2023, CAD s/p 5v CABG in 2007, HTN, chronic diastolic HF, carotid artery disease, CVA in 2023, and HLD presented to the ED for evaluation of chest pain and admitted for new onset A-fib.  # New onset A-fib - Patient with no history of A-fib presented with acute onset of chest pain and palpitations - Found to be in A-fib with RVR with EMS, converted back to normal sinus after IV Cardizem 10 mg x 1 - EKG on admission shows normal sinus with occasional PACs, HR stable in the 50s to 60s - Cardiology following, appreciate recs - Continue heparin  drip for now, likely transition to DOAC during the day - Continue Lopressor  - Check magnesium  and TSH - Telemetry  # Acute on chronic diastolic HF # Hx of AS s/p TAVR - Last TTE on 06/2024 shows EF 55 to 60%, G2DD, stable TAVR and moderate pulmonary valve regurg - Pt presented with  mild shortness of breath and chest pain - Pt with radiological, clinical and laboratory signs of CHF exacerbation - Acute CHF likely 2/2 new onset A-fib - Start IV lasix  40 mg daily - Continue losartan  and Lopressor  - Strict I&O, daily weights - Maintain K+ > 4.0, Mag > 2.0 - Telemetry  # Elevated troponin - Patient found to have troponin elevation, 40-->225  - Elevated troponin likely demand ischemia in the setting of A-fib with RVR and CHF exacerbation - Patient with initial chest pain but this is now resolved and EKG without obvious ischemic changes - Trend troponin until peaked  # HTN - BP elevated with SBP in the 130s to 160s - Continue losartan  and Lopressor   # HLD - Continue atorvastatin   # CAD s/p 5v CABG - Continue aspirin  and atorvastatin   # Hx of CVA - Continue aspirin  and atorvastatin   # Hypothyroidism - Follow-up repeat TSH - Continue Synthroid   # Allergies - Continue Flonase  and Atrovent nasal spray  # Mood disorder - Continue Celexa   # GERD - Continue famotidine   DVT prophylaxis: Heparin     Code Status: Limited: Do not attempt resuscitation (DNR) -DNR-LIMITED -Do Not Intubate/DNI   Consults called: Cardiology  Family Communication: No family at bedside  Severity of Illness: The appropriate patient status for this patient is INPATIENT. Inpatient status is judged to be reasonable and necessary in order to provide the required intensity of service to ensure the patient's safety. The patient's presenting symptoms, physical exam findings, and  initial radiographic and laboratory data in the context of their chronic comorbidities is felt to place them at high risk for further clinical deterioration. Furthermore, it is not anticipated that the patient will be medically stable for discharge from the hospital within 2 midnights of admission.   * I certify that at the point of admission it is my clinical judgment that the patient will require inpatient hospital  care spanning beyond 2 midnights from the point of admission due to high intensity of service, high risk for further deterioration and high frequency of surveillance required.*  Level of care: Telemetry Cardiac    Lou Claretta HERO, MD 09/12/2024, 3:59 AM [facility] Pager: [phone] Isaiah 41:10   If 7PM-7AM, please contact night-coverage [email or web address] Password [email or web address]

## 2024-09-13 ENCOUNTER — Ambulatory Visit (HOSPITAL_BASED_OUTPATIENT_CLINIC_OR_DEPARTMENT_OTHER)

## 2024-09-13 ENCOUNTER — Telehealth: Payer: Self-pay

## 2024-09-13 NOTE — Transitions of Care (Post Inpatient/ED Visit) (Signed)
   09/13/2024  Name: [name] MRN: [id number] DOB: May 05, 1941  Today's TOC FU Call Status: Today's TOC FU Call Status:: Unsuccessful Call (1st Attempt) Unsuccessful Call (1st Attempt) Date: 09/13/24  Attempted to reach the patient regarding the most recent Inpatient/ED visit.  Follow Up Plan: Additional outreach attempts will be made to reach the patient to complete the Transitions of Care (Post Inpatient/ED visit) call.   Signature Julian Lemmings, LPN [facility] Nurse Health Advisor Direct Dial [phone]

## 2024-09-14 ENCOUNTER — Other Ambulatory Visit: Payer: Self-pay | Admitting: Internal Medicine

## 2024-09-14 NOTE — Transitions of Care (Post Inpatient/ED Visit) (Signed)
   09/14/2024  Name: [name] MRN: [id number] DOB: [birth year]  Today's TOC FU Call Status: Today's TOC FU Call Status:: Unsuccessful Call (2nd Attempt) Unsuccessful Call (1st Attempt) Date: 09/13/24 Unsuccessful Call (2nd Attempt) Date: 09/14/24  Attempted to reach the patient regarding the most recent Inpatient/ED visit.  Follow Up Plan: Additional outreach attempts will be made to reach the patient to complete the Transitions of Care (Post Inpatient/ED visit) call.   Signature Julian Lemmings, LPN [facility] Nurse Health Advisor Direct Dial [phone]

## 2024-09-17 NOTE — Transitions of Care (Post Inpatient/ED Visit) (Signed)
 09/17/2024  Name: [name] MRN: [id number] DOB: Apr 22, 1941  Today's TOC FU Call Status: Today's TOC FU Call Status:: Successful TOC FU Call Completed Unsuccessful Call (1st Attempt) Date: 09/13/24 Unsuccessful Call (2nd Attempt) Date: 09/14/24 [facility] FU Call Complete Date: 09/17/24 Patient's Name and Date of Birth confirmed.  Transition Care Management Follow-up Telephone Call Date of Discharge: 09/12/24 Discharge Facility: [facility]) Type of Discharge: Inpatient Admission Primary Inpatient Discharge Diagnosis:: afib How have you been since you were released from the hospital?: Better Any questions or concerns?: No  Items Reviewed: Did you receive and understand the discharge instructions provided?: Yes Medications obtained,verified, and reconciled?: Yes (Medications Reviewed) Any new allergies since your discharge?: No Dietary orders reviewed?: Yes Do you have support at home?: No  Medications Reviewed Today: Medications Reviewed Today     Reviewed by Emmitt Pan, LPN (Licensed Practical Nurse) on 09/17/24 at 1523  Med List Status: <None>   Medication Order Taking? Sig Documenting Provider Last Dose Status Informant  alendronate  (FOSAMAX ) 70 MG tablet [id number] Yes Take 1 tablet (70 mg total) by mouth every 7 (seven) days. Take with a full glass of water  on an empty stomach. Geofm Glade JINNY, MD  Active Self, Pharmacy Records  apixaban [facility]) 2.5 MG TABS tablet [id number] Yes Take 1 tablet (2.5 mg total) by mouth 2 (two) times daily. Jonel Lonni SQUIBB, MD  Active   atorvastatin  (LIPITOR) 20 MG tablet [id number] Yes TAKE 1 TABLET BY MOUTH EVERY DAY Burns, Glade JINNY, MD  Active   Calcium  Carbonate (CALTRATE 600 PO) [id number] Yes Take 1 tablet by mouth 2 (two) times daily.   [provider]  Active Self  chlorpheniramine (CHLOR-TRIMETON) 4 MG tablet [id number] Yes Take 2 mg by mouth in the morning. [provider]  Active Self  citalopram  (CELEXA ) 20  MG tablet [id number] Yes TAKE 1 TABLET BY MOUTH EVERY DAY Burns, Glade JINNY, MD  Active Self, Pharmacy Records  co-enzyme Q-10 30 MG capsule [id number] Yes Take 30 mg by mouth daily. [provider]  Active Self  famotidine  (PEPCID ) 40 MG tablet [id number] Yes TAKE 1 TABLET BY MOUTH EVERY DAY Burns, Glade JINNY, MD  Active Self, Pharmacy Records  fluticasone  (FLONASE ) 50 MCG/ACT nasal spray [id number] Yes Place 1 spray into both nostrils daily as needed for allergies or rhinitis. Geofm Glade JINNY, MD  Active Self, Pharmacy Records  ipratropium (ATROVENT) 0.03 % nasal spray [id number] Yes Place 2 sprays into both nostrils 3 (three) times daily with meals as needed for rhinitis. Anice Riis, DO  Active Self, Pharmacy Records  levothyroxine  (SYNTHROID ) 75 MCG tablet [id number] Yes TAKE 1 TABLET BY MOUTH EVERY MORNING BEFORE BREAKFAST Burns, Glade JINNY, MD  Active Self, Pharmacy Records  losartan  (COZAAR ) 25 MG tablet [id number] Yes Take 1 tablet (25 mg total) by mouth daily. Geofm Glade JINNY, MD  Active Self, Pharmacy Records  metoprolol  tartrate (LOPRESSOR ) 25 MG tablet [id number] Yes TAKE 1 TABLET BY MOUTH TWICE A DAY Burns, Glade JINNY, MD  Active Self, Pharmacy Records  Multiple Vitamins-Minerals (MULTIVITAMIN) LIQD [id number] Yes Take 1 tablet by mouth daily. [provider]  Active Self, Pharmacy Records  Multiple Vitamins-Minerals (PRESERVISION AREDS 2) CAPS [id number] Yes Take 1 capsule by mouth 2 (two) times daily. [provider]  Active Self, Pharmacy Records  senna-docusate (SENOKOT-S) 8.6-50 MG per tablet [id number] Yes Take 1 tablet by mouth daily.   [provider]  Active Self, Pharmacy Records  VITAMIN D , CHOLECALCIFEROL, PO [id number] Yes  Take 1 tablet by mouth daily. [provider]  Active Self, Pharmacy Records  zolpidem  (AMBIEN ) 5 MG tablet [id number] Yes TAKE 1 TABLET (5 MG TOTAL) BY MOUTH EVERY DAY AT BEDTIME AS NEEDED FOR SLEEP Burns, Glade PARAS, MD  Active Self,  Pharmacy Records            Home Care and Equipment/Supplies: Were Home Health Services Ordered?: NA Any new equipment or medical supplies ordered?: NA  Functional Questionnaire: Do you need assistance with bathing/showering or dressing?: No Do you need assistance with meal preparation?: No Do you need assistance with eating?: No Do you have difficulty maintaining continence: No Do you need assistance with getting out of bed/getting out of a chair/moving?: No Do you have difficulty managing or taking your medications?: No  Follow up appointments reviewed: PCP Follow-up appointment confirmed?: Yes Date of PCP follow-up appointment?: 09/19/24 Follow-up Provider: [facility] Follow-up appointment confirmed?: NA Do you need transportation to your follow-up appointment?: No Do you understand care options if your condition(s) worsen?: Yes-patient verbalized understanding    SIGNATURE Julian Lemmings, LPN [facility] Nurse Health Advisor Direct Dial [phone]

## 2024-09-18 ENCOUNTER — Encounter: Payer: Self-pay | Admitting: Internal Medicine

## 2024-09-18 NOTE — Progress Notes (Unsigned)
 Subjective:    Patient ID: [name], female    DOB: August 09, 1941, 83 y.o.   MRN: [id number]     HPI Barrett is here for follow up from the hospital  ED 10/28-admitted- discharged 10/29  She presented to the ED with chest pain.  She stated the pain was acute onset around 7:30 PM while at rest.  The pain radiated through to both shoulders.  EMS noted the patient to be in atrial fibrillation with RVR and administered Cardizem 10 mg which reestablish normal sinus rhythm.  They also gave her 1 L IVF prior to arrival.  Her chest pain had resolved, but she was still having discomfort in her shoulders.  Chest x-ray showed possible pulmonary edema, BNP was ordered and 24.  Troponin 40-> 225.  EKG nonischemic.  Discussed with cardiology-recommended heparin  drip and admission.    She had an echocardiogram which was limited.  EF 60-65% LV without regional wall motion abnormalities.  Mild LVH diastolic parameters indeterminate.  Aortic valve replaced.    Medications and allergies reviewed with patient and updated if appropriate.  Current Outpatient Medications on File Prior to Visit  Medication Sig Dispense Refill   alendronate  (FOSAMAX ) 70 MG tablet Take 1 tablet (70 mg total) by mouth every 7 (seven) days. Take with a full glass of water  on an empty stomach. 12 tablet 3   apixaban (ELIQUIS) 2.5 MG TABS tablet Take 1 tablet (2.5 mg total) by mouth 2 (two) times daily. 180 tablet 0   atorvastatin  (LIPITOR) 20 MG tablet TAKE 1 TABLET BY MOUTH EVERY DAY 90 tablet 1   Calcium  Carbonate (CALTRATE 600 PO) Take 1 tablet by mouth 2 (two) times daily.       chlorpheniramine (CHLOR-TRIMETON) 4 MG tablet Take 2 mg by mouth in the morning.     citalopram  (CELEXA ) 20 MG tablet TAKE 1 TABLET BY MOUTH EVERY DAY 90 tablet 2   co-enzyme Q-10 30 MG capsule Take 30 mg by mouth daily.     famotidine  (PEPCID ) 40 MG tablet TAKE 1 TABLET BY MOUTH EVERY DAY 90 tablet 1   fluticasone  (FLONASE ) 50 MCG/ACT nasal spray  Place 1 spray into both nostrils daily as needed for allergies or rhinitis. 9.9 mL 5   ipratropium (ATROVENT) 0.03 % nasal spray Place 2 sprays into both nostrils 3 (three) times daily with meals as needed for rhinitis. 30 mL 12   levothyroxine  (SYNTHROID ) 75 MCG tablet TAKE 1 TABLET BY MOUTH EVERY MORNING BEFORE BREAKFAST 90 tablet 1   losartan  (COZAAR ) 25 MG tablet Take 1 tablet (25 mg total) by mouth daily. 90 tablet 3   metoprolol  tartrate (LOPRESSOR ) 25 MG tablet TAKE 1 TABLET BY MOUTH TWICE A DAY 180 tablet 1   Multiple Vitamins-Minerals (MULTIVITAMIN) LIQD Take 1 tablet by mouth daily.     Multiple Vitamins-Minerals (PRESERVISION AREDS 2) CAPS Take 1 capsule by mouth 2 (two) times daily.     senna-docusate (SENOKOT-S) 8.6-50 MG per tablet Take 1 tablet by mouth daily.       VITAMIN D , CHOLECALCIFEROL, PO Take 1 tablet by mouth daily.     zolpidem  (AMBIEN ) 5 MG tablet TAKE 1 TABLET (5 MG TOTAL) BY MOUTH EVERY DAY AT BEDTIME AS NEEDED FOR SLEEP 30 tablet 3   No current facility-administered medications on file prior to visit.     Review of Systems     Objective:  There were no vitals filed for this visit. BP Readings from  Last 3 Encounters:  09/12/24 (!) 163/60  08/31/24 135/74  07/09/24 (!) 151/64   Wt Readings from Last 3 Encounters:  09/11/24 115 lb (52.2 kg)  09/03/24 115 lb (52.2 kg)  08/31/24 115 lb (52.2 kg)   There is no height or weight on file to calculate BMI.    Physical Exam     Lab Results  Component Value Date   WBC 8.3 09/12/2024   HGB 11.2 (L) 09/12/2024   HCT 34.6 (L) 09/12/2024   PLT 222 09/12/2024   GLUCOSE 106 (H) 09/12/2024   CHOL 125 06/20/2024   TRIG 58.0 06/20/2024   HDL 52.10 06/20/2024   LDLCALC 61 06/20/2024   ALT 31 09/11/2024   AST 43 (H) 09/11/2024   NA 139 09/12/2024   K 3.7 09/12/2024   CL 107 09/12/2024   CREATININE 0.60 09/12/2024   BUN 11 09/12/2024   CO2 24 09/12/2024   TSH 2.435 09/12/2024   INR 1.1 06/17/2023    HGBA1C 6.0 06/20/2024   ECHOCARDIOGRAM LIMITED    ECHOCARDIOGRAM LIMITED REPORT       Patient Name:   [name] Date of Exam: 09/12/2024 Medical Rec #:  [id number]     Height:       64.0 in Accession #:    [id number]    Weight:       115.0 lb Date of Birth:  20-Dec-1940      BSA:          1.546 m Patient Age:    83 years      BP:           154/56 mmHg Patient Gender: F             HR:           38 bpm. Exam Location:  Inpatient  Procedure: Limited Echo, Cardiac Doppler and Color Doppler (Both Spectral and            Color Flow Doppler were utilized during procedure).  Indications:    NSTEMI   History:        Patient has prior history of Echocardiogram examinations, most                 recent 07/05/2024. CHF, CAD, Prior CABG; Risk                 Factors:Hypertension and Dyslipidemia.                 Aortic Valve: 23 mm Sapien prosthetic, stented (TAVR) valve is                 present in the aortic position. Procedure Date: 06/21/23.   Sonographer:    Philomena Daring Referring Phys: [id number] [facility] [place] O'[place]    Sonographer Comments: Suboptimal apical window and Technically challenging study due to limited acoustic windows. Image acquisition challenging due to patient body habitus and Image acquisition challenging due to breast implants. IMPRESSIONS   1. Limited echo  2. Left ventricular ejection fraction, by estimation, is 60 to 65%. Left ventricular ejection fraction by PLAX is 60 %. The left ventricle has normal function. The left ventricle has no regional wall motion abnormalities. There is mild left ventricular  hypertrophy. Left ventricular diastolic parameters are indeterminate.  3. Right ventricular systolic function is normal. The right ventricular size is normal.  4. Aortic valve gradients were not obtained. The aortic valve has been repaired/replaced. Aortic valve regurgitation is not visualized. There is a  23 mm Sapien prosthetic (TAVR) valve present in the aortic  position. Procedure Date: 06/21/23.  5. The pulmonic valve was abnormal.  6. The inferior vena cava is normal in size with greater than 50% respiratory variability, suggesting right atrial pressure of 3 mmHg.  Comparison(s): Changes from prior study are noted. 07/05/2024: LVEF 55-60%.  FINDINGS  Left Ventricle: Left ventricular ejection fraction, by estimation, is 60 to 65%. Left ventricular ejection fraction by PLAX is 60 %. The left ventricle has normal function. The left ventricle has no regional wall motion abnormalities. The left  ventricular internal cavity size was normal in size. There is mild left ventricular hypertrophy. Left ventricular diastolic parameters are indeterminate.  Right Ventricle: The right ventricular size is normal. No increase in right ventricular wall thickness. Right ventricular systolic function is normal.  Aortic Valve: Aortic valve gradients were not obtained. The aortic valve has been repaired/replaced. Aortic valve regurgitation is not visualized. There is a 23 mm Sapien prosthetic, stented (TAVR) valve present in the aortic position. Procedure Date:  06/21/23.  Pulmonic Valve: The pulmonic valve was abnormal. Pulmonic valve regurgitation is mild.  Venous: The inferior vena cava is normal in size with greater than 50% respiratory variability, suggesting right atrial pressure of 3 mmHg.  Additional Comments: Spectral Doppler performed. Color Doppler performed.   LEFT VENTRICLE PLAX 2D LV EF:         Left ventricular ejection fraction by PLAX is 60 %. LVIDd:         2.90 cm LVIDs:         2.00 cm LV PW:         1.00 cm LV IVS:        1.20 cm LVOT diam:     2.00 cm LVOT Area:     3.14 cm    IVC IVC diam: 1.60 cm  LEFT ATRIUM         Index LA diam:    2.90 cm 1.88 cm/m    AORTA Ao Root diam: 2.60 cm    SHUNTS Systemic Diam: 2.00 cm  Vinie Maxcy MD Electronically signed by Vinie Maxcy MD Signature Date/Time: 09/12/2024/3:34:43 PM       Final      Assessment & Plan:    See Problem List for Assessment and Plan of chronic medical problems.

## 2024-09-19 ENCOUNTER — Ambulatory Visit: Admitting: Internal Medicine

## 2024-09-19 VITALS — BP 136/66 | HR 102 | Temp 98.1°F | Ht 64.0 in | Wt 114.0 lb

## 2024-09-19 DIAGNOSIS — I4891 Unspecified atrial fibrillation: Secondary | ICD-10-CM

## 2024-09-19 DIAGNOSIS — I10 Essential (primary) hypertension: Secondary | ICD-10-CM

## 2024-09-19 DIAGNOSIS — Z23 Encounter for immunization: Secondary | ICD-10-CM

## 2024-09-19 DIAGNOSIS — I5033 Acute on chronic diastolic (congestive) heart failure: Secondary | ICD-10-CM

## 2024-09-19 DIAGNOSIS — E039 Hypothyroidism, unspecified: Secondary | ICD-10-CM

## 2024-09-19 MED ORDER — LOSARTAN POTASSIUM 25 MG PO TABS: 12.5000 mg | ORAL_TABLET | Freq: Every day | ORAL | Status: AC

## 2024-09-19 NOTE — Assessment & Plan Note (Addendum)
 Episode of acute on chronic diastolic heart failure while in the hospital which was likely induced secondary to new onset A-fib She denies any shortness of breath Lungs are clear and she appears euvolemic No diuretic is necessary Discussed monitoring her weight daily, watching her sodium intake and monitoring for shortness of breath or leg edema Continue metoprolol  25 mg twice daily, losartan  12.5 mg daily

## 2024-09-19 NOTE — Patient Instructions (Addendum)
    Flu immunization administered today.       Medications changes include :   None      Return for follow up as scheduled.

## 2024-09-19 NOTE — Assessment & Plan Note (Signed)
 Chronic Blood pressure controlled Continue losartan  to 12.5 mg daily Continue metoprolol  25 mg twice daily

## 2024-09-19 NOTE — Assessment & Plan Note (Signed)
 New Recent hospitalization for new onset A-fib which did cause some acute on chronic diastolic heart failure Troponin was elevated, EKG was nonischemic.  Initially on heparin  drip Echocardiogram reassuring Continue Eliquis 2.5 mg twice daily and metoprolol  25 mg twice daily Since being discharged she has had 1 episode of atrial fibrillation, which did resolve on its own Advised that if she has another episode she can take an extra half of the metoprolol  Has follow-up with cardiology

## 2024-09-19 NOTE — Assessment & Plan Note (Signed)
 Chronic  Clinically euthyroid Lab Results  Component Value Date   TSH 2.435 09/12/2024   Continue levothyroxine  75 mcg daily

## 2024-09-20 NOTE — Addendum Note (Signed)
 Addended by: CLAUDENE TOBIAS PARAS on: 09/20/2024 11:42 AM   Modules accepted: Orders

## 2024-09-25 ENCOUNTER — Ambulatory Visit (HOSPITAL_BASED_OUTPATIENT_CLINIC_OR_DEPARTMENT_OTHER)
Admission: RE | Admit: 2024-09-25 | Discharge: 2024-09-25 | Disposition: A | Discharge status: Home / Self Care | Source: Ambulatory Visit

## 2024-09-25 DIAGNOSIS — J321 Chronic frontal sinusitis: Secondary | ICD-10-CM | POA: Diagnosis not present

## 2024-09-25 DIAGNOSIS — I6523 Occlusion and stenosis of bilateral carotid arteries: Secondary | ICD-10-CM | POA: Diagnosis not present

## 2024-09-26 ENCOUNTER — Ambulatory Visit: Admitting: Physician Assistant

## 2024-10-01 ENCOUNTER — Other Ambulatory Visit (HOSPITAL_COMMUNITY): Payer: Self-pay

## 2024-10-02 ENCOUNTER — Ambulatory Visit: Admitting: Physician Assistant

## 2024-10-03 ENCOUNTER — Telehealth: Payer: Self-pay

## 2024-10-03 NOTE — Telephone Encounter (Signed)
 Copied from CRM [id number]. Topic: Clinical - Medication Question >> Oct 03, 2024  2:54 PM Franky GRADE wrote: Reason for CRM: When atient was discharged from the ED they gave her a bottle of Rx #: [id number]  apixaban (ELIQUIS) 2.5 MG TABS tablet [[id number]] , she went to the pharmacy and they advised in order for it to be filled it would be an out of pocket cost, she is unsure who she should follow up with either Dr.Burns or her cardilogist.

## 2024-10-04 ENCOUNTER — Ambulatory Visit (INDEPENDENT_AMBULATORY_CARE_PROVIDER_SITE_OTHER): Admitting: Sports Medicine

## 2024-10-04 ENCOUNTER — Encounter: Payer: Self-pay | Admitting: Sports Medicine

## 2024-10-04 VITALS — BP 138/58 | Ht 64.0 in | Wt 114.0 lb

## 2024-10-04 DIAGNOSIS — Q667 Congenital pes cavus, unspecified foot: Secondary | ICD-10-CM

## 2024-10-04 NOTE — Telephone Encounter (Signed)
 Spoke with patient today.  She has enough Eliquis to hold her until her appointment with Cardiology next mont and told her they could decided on how to proceed with taking it.

## 2024-10-04 NOTE — Progress Notes (Signed)
 Established Patient Office Visit  PCP: Geofm Glade PARAS, MD  Patient is a 83 y.o. female here for custom orthotics.  She has severe equinovarus cavus foot deformity bilaterally, worse on the right.  Her last orthotics were made in 2015.  She has been able to walk and stand without significant pain while wearing her orthotics.  Past Medical History:  Diagnosis Date   Allergy     SEASONAL   Anxiety    BREAST CANCER 1991   left, 1991; B mastectomy   CAD (coronary artery disease)    s/p CABG x5 in 2007   Cataract    BILATERAL-REMOVED   Depression    DIVERTICULOSIS    GERD    History of DVT (deep vein thrombosis)    HYPERLIPIDEMIA    Hypertension    HYPOTHYROIDISM    Occlusion and stenosis of carotid artery without mention of cerebral infarction    OSTEOPOROSIS    S/P TAVR (transcatheter aortic valve replacement) 06/21/2023   s/p TAVR with a 23mm Edwards S3UR via the TF approach by Dr. Verlin & Dr. Maryjane   Severe aortic stenosis     Current Outpatient Medications on File Prior to Visit  Medication Sig Dispense Refill   alendronate  (FOSAMAX ) 70 MG tablet Take 1 tablet (70 mg total) by mouth every 7 (seven) days. Take with a full glass of water  on an empty stomach. 12 tablet 3   apixaban (ELIQUIS) 2.5 MG TABS tablet Take 1 tablet (2.5 mg total) by mouth 2 (two) times daily. 180 tablet 0   atorvastatin  (LIPITOR) 20 MG tablet TAKE 1 TABLET BY MOUTH EVERY DAY 90 tablet 1   Calcium  Carbonate (CALTRATE 600 PO) Take 1 tablet by mouth 2 (two) times daily.       chlorpheniramine (CHLOR-TRIMETON) 4 MG tablet Take 2 mg by mouth in the morning.     citalopram  (CELEXA ) 20 MG tablet TAKE 1 TABLET BY MOUTH EVERY DAY 90 tablet 2   co-enzyme Q-10 30 MG capsule Take 30 mg by mouth daily.     famotidine  (PEPCID ) 40 MG tablet TAKE 1 TABLET BY MOUTH EVERY DAY 90 tablet 1   fluticasone  (FLONASE ) 50 MCG/ACT nasal spray Place 1 spray into both nostrils daily as needed for allergies or rhinitis. 9.9  mL 5   ipratropium (ATROVENT) 0.03 % nasal spray Place 2 sprays into both nostrils 3 (three) times daily with meals as needed for rhinitis. 30 mL 12   levothyroxine  (SYNTHROID ) 75 MCG tablet TAKE 1 TABLET BY MOUTH EVERY MORNING BEFORE BREAKFAST 90 tablet 1   losartan  (COZAAR ) 25 MG tablet Take 0.5 tablets (12.5 mg total) by mouth daily.     metoprolol  tartrate (LOPRESSOR ) 25 MG tablet TAKE 1 TABLET BY MOUTH TWICE A DAY 180 tablet 1   Multiple Vitamins-Minerals (MULTIVITAMIN) LIQD Take 1 tablet by mouth daily.     Multiple Vitamins-Minerals (PRESERVISION AREDS 2) CAPS Take 1 capsule by mouth 2 (two) times daily.     senna-docusate (SENOKOT-S) 8.6-50 MG per tablet Take 1 tablet by mouth daily.       VITAMIN D , CHOLECALCIFEROL, PO Take 1 tablet by mouth daily.     zolpidem  (AMBIEN ) 5 MG tablet TAKE 1 TABLET (5 MG TOTAL) BY MOUTH EVERY DAY AT BEDTIME AS NEEDED FOR SLEEP 30 tablet 3   No current facility-administered medications on file prior to visit.    Past Surgical History:  Procedure Laterality Date   APPENDECTOMY     COLONOSCOPY  CORONARY ARTERY BYPASS GRAFT     5        2007   ENDARTERECTOMY FEMORAL Right 06/22/2023   Procedure: RIGHT FEMORAL ENDARTERECTOMY WITH PATCH ANGIOPLASTY;  Surgeon: Serene Gaile ORN, MD;  Location: [facility] OR;  Service: Vascular;  Laterality: Right;   FEMORAL-POPLITEAL BYPASS GRAFT Right 06/22/2023   Procedure: FEMORAL THROMBECTOMY;  Surgeon: Serene Gaile ORN, MD;  Location: [facility] OR;  Service: Vascular;  Laterality: Right;   INTRAOPERATIVE TRANSTHORACIC ECHOCARDIOGRAM N/A 06/21/2023   Procedure: INTRAOPERATIVE TRANSTHORACIC ECHOCARDIOGRAM;  Surgeon: Verlin Lonni BIRCH, MD;  Location: [facility];  Service: Open Heart Surgery;  Laterality: N/A;   MASTECTOMY     left with reconstruction and lymph node excision  1992   MASTECTOMY     right with reconstruction   PATCH ANGIOPLASTY Right 06/22/2023   Procedure: PATCH ANGIOPLASTY OF RIGHT FEMORAL ARTERY USING BOVINE  PERICARDIAL PATCH;  Surgeon: Serene Gaile ORN, MD;  Location: [facility] OR;  Service: Vascular;  Laterality: Right;   REVISION RECONSTRUCTED BREAST Left 02/2014   willard (plastics in [place])   right ankle arthroscopy     RIGHT/LEFT HEART CATH AND CORONARY ANGIOGRAPHY N/A 06/09/2023   Procedure: RIGHT/LEFT HEART CATH AND CORONARY ANGIOGRAPHY;  Surgeon: Verlin Lonni BIRCH, MD;  Location: [facility];  Service: Cardiovascular;  Laterality: N/A;   TEAR DUCT CANAL     TONSILLECTOMY AND ADENOIDECTOMY     TRANSCATHETER AORTIC VALVE REPLACEMENT, TRANSFEMORAL N/A 06/21/2023   Procedure: Transcatheter Aortic Valve Replacement, Transfemoral;  Surgeon: Verlin Lonni BIRCH, MD;  Location: [facility];  Service: Open Heart Surgery;  Laterality: N/A;   UPPER GASTROINTESTINAL ENDOSCOPY     VAGINAL HYSTERECTOMY     ovaries not excised    Allergies  Allergen Reactions   Clarithromycin Other (See Comments)    PATIENT UNSURE OF REACTION   Clindamycin     Unknown reaction   Codeine Nausea Only   Levofloxacin Nausea Only   Morphine Other (See Comments)    Nausea  Nausea    Naproxen     REACTION: nausea   Omeprazole  Other (See Comments)    Made her jittery, made her feel weird, ?rash   Penicillins Rash        Pneumococcal Vaccines Rash    Small rash on arm at injection site.     BP (!) 138/58   Ht 5' 4 (1.626 m)   Wt 114 lb (51.7 kg)   BMI 19.57 kg/m       No data to display              No data to display              Objective:  Physical Exam:  Gen: NAD, comfortable in exam room  Bilateral feet Equinovarus cavus deformity bilaterally, more severe on the right with inversion contracture Hammertoe digits 2-4 of the right foot with subluxation of the fifth digit Hallux rigidus of the right great toe Hallux valgus of the great toe of the left foot with crossover There is significant genu valgus of the right knee, to a lesser extent on the left Dynamic external  rotation of the right foot to 15 degrees, 10 degrees on the left  Assessment and Plan:  Severe bilateral equinovarus Patient is here for custom orthotics, which are medical necessary given her severe bilateral equinovarus cavus deformities 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  See orthotic documentation below.  She will follow-up as needed.  Patient  was fitted for a : Fit 'n Run semi-rigid orthotic  The orthotic was heated, placed on the orthotic stand. The patient was positioned in subtalar neutral position and 10 degrees of ankle dorsiflexion in a weight bearing stance on the heated orthotic blank After completion of molding Blank: Fit 'n Run - size 6 Posting: Right lateral heel wedge Base:  none   Manus FORBES Fireman, MD

## 2024-10-05 ENCOUNTER — Telehealth: Payer: Self-pay

## 2024-10-05 ENCOUNTER — Encounter (INDEPENDENT_AMBULATORY_CARE_PROVIDER_SITE_OTHER): Payer: Self-pay

## 2024-10-05 ENCOUNTER — Ambulatory Visit (INDEPENDENT_AMBULATORY_CARE_PROVIDER_SITE_OTHER)

## 2024-10-05 VITALS — BP 140/57 | HR 56 | Ht 62.0 in | Wt 115.0 lb

## 2024-10-05 DIAGNOSIS — H9201 Otalgia, right ear: Secondary | ICD-10-CM

## 2024-10-05 DIAGNOSIS — J3489 Other specified disorders of nose and nasal sinuses: Secondary | ICD-10-CM

## 2024-10-05 DIAGNOSIS — J329 Chronic sinusitis, unspecified: Secondary | ICD-10-CM | POA: Diagnosis not present

## 2024-10-05 DIAGNOSIS — J322 Chronic ethmoidal sinusitis: Secondary | ICD-10-CM

## 2024-10-05 DIAGNOSIS — J320 Chronic maxillary sinusitis: Secondary | ICD-10-CM

## 2024-10-05 DIAGNOSIS — J321 Chronic frontal sinusitis: Secondary | ICD-10-CM

## 2024-10-05 NOTE — Telephone Encounter (Signed)
 Copied from CRM #[id number]. Topic: General - Other >> Oct 05, 2024  1:58 PM Brittany M wrote: Reason for CRM: Patient asking for Claudene Tobias PARAS, CMA to call her regarding the Eliquis  medication.

## 2024-10-08 NOTE — Progress Notes (Signed)
 Dear Dr. Geofm, Here is my assessment for our mutual patient, [name]. Thank you for allowing me the opportunity to care for your patient. Please do not hesitate to contact me should you have any other questions. Sincerely, Dr. Penne Croak  Otolaryngology Clinic Note Referring provider: Dr. Geofm HPI:  Discussed the use of AI scribe software for clinical note transcription with the patient, who gave verbal consent to proceed.  History of Present Illness [name] is an 83 year old female who presents with chronic sinusitis and nasal polyps.  Chronic sinonasal symptoms - Chronic sinusitis with persistent nasal polyps - Frequent nasal discharge that is thick, sticky, and white - Symptoms refractory to allergy  testing and allergy  immunotherapy - Limited relief with nasal sprays, including Flonase  - symptomatically mildly improved over the last week.  Auditory disturbances - Hearing impairment requiring use of hearing aids, currently on third pair - Ear described as feeling 'patchy' - Previous constant 'singing' tinnitus, now resolved - Current humming sensation in ears causing nervousness - Prior concern for tumor in ear  Relevant surgical and oncologic history - History of cancer 33 years ago - History of open heart surgery with aortic valve removal; surgery did not proceed as planned   Independent Review of Additional Tests or Records:  Reviewed external note from referring PCP, Burns,describing relevant history incorporated into today's evaluation. I personally reviewed and interpreted Ct Sinus - persistent mucosal thickening of the bilateral maxillary sinuses, mild bilateral anterior ethmoids and left frontal sinus.   PMH/Meds/All/SocHx/FamHx/ROS:   Past Medical History:  Diagnosis Date   Allergy     SEASONAL   Anxiety    BREAST CANCER 1991   left, 1991; B mastectomy   CAD (coronary artery disease)    s/p CABG x5 in 2007   Cataract    BILATERAL-REMOVED    Depression    DIVERTICULOSIS    GERD    History of DVT (deep vein thrombosis)    HYPERLIPIDEMIA    Hypertension    HYPOTHYROIDISM    Occlusion and stenosis of carotid artery without mention of cerebral infarction    OSTEOPOROSIS    S/P TAVR (transcatheter aortic valve replacement) 06/21/2023   s/p TAVR with a 23mm Edwards S3UR via the TF approach by Dr. Verlin & Dr. Maryjane   Severe aortic stenosis      Past Surgical History:  Procedure Laterality Date   APPENDECTOMY     COLONOSCOPY     CORONARY ARTERY BYPASS GRAFT     5        2007   ENDARTERECTOMY FEMORAL Right 06/22/2023   Procedure: RIGHT FEMORAL ENDARTERECTOMY WITH PATCH ANGIOPLASTY;  Surgeon: Serene Gaile ORN, MD;  Location: [facility] OR;  Service: Vascular;  Laterality: Right;   FEMORAL-POPLITEAL BYPASS GRAFT Right 06/22/2023   Procedure: FEMORAL THROMBECTOMY;  Surgeon: Serene Gaile ORN, MD;  Location: [facility] OR;  Service: Vascular;  Laterality: Right;   INTRAOPERATIVE TRANSTHORACIC ECHOCARDIOGRAM N/A 06/21/2023   Procedure: INTRAOPERATIVE TRANSTHORACIC ECHOCARDIOGRAM;  Surgeon: Verlin Lonni BIRCH, MD;  Location: [facility];  Service: Open Heart Surgery;  Laterality: N/A;   MASTECTOMY     left with reconstruction and lymph node excision  1992   MASTECTOMY     right with reconstruction   PATCH ANGIOPLASTY Right 06/22/2023   Procedure: PATCH ANGIOPLASTY OF RIGHT FEMORAL ARTERY USING BOVINE PERICARDIAL PATCH;  Surgeon: Serene Gaile ORN, MD;  Location: [facility] OR;  Service: Vascular;  Laterality: Right;   REVISION RECONSTRUCTED BREAST Left 02/2014  willard (plastics in [place])   right ankle arthroscopy     RIGHT/LEFT HEART CATH AND CORONARY ANGIOGRAPHY N/A 06/09/2023   Procedure: RIGHT/LEFT HEART CATH AND CORONARY ANGIOGRAPHY;  Surgeon: Verlin Lonni BIRCH, MD;  Location: [facility];  Service: Cardiovascular;  Laterality: N/A;   TEAR DUCT CANAL     TONSILLECTOMY AND ADENOIDECTOMY     TRANSCATHETER AORTIC VALVE REPLACEMENT,  TRANSFEMORAL N/A 06/21/2023   Procedure: Transcatheter Aortic Valve Replacement, Transfemoral;  Surgeon: Verlin Lonni BIRCH, MD;  Location: [facility];  Service: Open Heart Surgery;  Laterality: N/A;   UPPER GASTROINTESTINAL ENDOSCOPY     VAGINAL HYSTERECTOMY     ovaries not excised    Family History  Problem Relation Age of Onset   Colon cancer Sister    Colon cancer Sister    Breast cancer Mother    Lung disease Mother    Emphysema Father    Esophageal cancer Neg Hx    Rectal cancer Neg Hx    Stomach cancer Neg Hx    Thyroid  disease Neg Hx      Social Connections: Moderately Integrated (01/23/2024)   Social Connection and Isolation Panel    Frequency of Communication with Friends and Family: More than three times a week    Frequency of Social Gatherings with Friends and Family: More than three times a week    Attends Religious Services: More than 4 times per year    Active Member of [employer] or Organizations: Yes    Attends Banker Meetings: Never    Marital Status: Divorced      Current Outpatient Medications:    alendronate  (FOSAMAX ) 70 MG tablet, Take 1 tablet (70 mg total) by mouth every 7 (seven) days. Take with a full glass of water  on an empty stomach., Disp: 12 tablet, Rfl: 3   apixaban  (ELIQUIS ) 2.5 MG TABS tablet, Take 1 tablet (2.5 mg total) by mouth 2 (two) times daily., Disp: 180 tablet, Rfl: 0   atorvastatin  (LIPITOR) 20 MG tablet, TAKE 1 TABLET BY MOUTH EVERY DAY, Disp: 90 tablet, Rfl: 1   Calcium  Carbonate (CALTRATE 600 PO), Take 1 tablet by mouth 2 (two) times daily.  , Disp: , Rfl:    chlorpheniramine (CHLOR-TRIMETON) 4 MG tablet, Take 2 mg by mouth in the morning., Disp: , Rfl:    citalopram  (CELEXA ) 20 MG tablet, TAKE 1 TABLET BY MOUTH EVERY DAY, Disp: 90 tablet, Rfl: 2   co-enzyme Q-10 30 MG capsule, Take 30 mg by mouth daily., Disp: , Rfl:    famotidine  (PEPCID ) 40 MG tablet, TAKE 1 TABLET BY MOUTH EVERY DAY, Disp: 90 tablet, Rfl: 1    fluticasone  (FLONASE ) 50 MCG/ACT nasal spray, Place 1 spray into both nostrils daily as needed for allergies or rhinitis., Disp: 9.9 mL, Rfl: 5   ipratropium (ATROVENT ) 0.03 % nasal spray, Place 2 sprays into both nostrils 3 (three) times daily with meals as needed for rhinitis., Disp: 30 mL, Rfl: 12   levothyroxine  (SYNTHROID ) 75 MCG tablet, TAKE 1 TABLET BY MOUTH EVERY MORNING BEFORE BREAKFAST, Disp: 90 tablet, Rfl: 1   losartan  (COZAAR ) 25 MG tablet, Take 0.5 tablets (12.5 mg total) by mouth daily., Disp: , Rfl:    metoprolol  tartrate (LOPRESSOR ) 25 MG tablet, TAKE 1 TABLET BY MOUTH TWICE A DAY, Disp: 180 tablet, Rfl: 1   Multiple Vitamins-Minerals (MULTIVITAMIN) LIQD, Take 1 tablet by mouth daily., Disp: , Rfl:    Multiple Vitamins-Minerals (PRESERVISION AREDS 2) CAPS, Take 1  capsule by mouth 2 (two) times daily., Disp: , Rfl:    senna-docusate (SENOKOT-S) 8.6-50 MG per tablet, Take 1 tablet by mouth daily.  , Disp: , Rfl:    VITAMIN D , CHOLECALCIFEROL , PO, Take 1 tablet by mouth daily., Disp: , Rfl:    zolpidem  (AMBIEN ) 5 MG tablet, TAKE 1 TABLET (5 MG TOTAL) BY MOUTH EVERY DAY AT BEDTIME AS NEEDED FOR SLEEP, Disp: 30 tablet, Rfl: 3   Physical Exam:   BP (!) 140/57 (BP Location: Right Arm, Patient Position: Sitting)   Pulse (!) 56   Ht 5' 2 (1.575 m)   Wt 115 lb (52.2 kg)   SpO2 95%   BMI 21.03 kg/m   The patient was awake, alert, and appropriate. The external ears were inspected, and otoscopy was performed to evaluate the external auditory canals and tympanic membranes. The nasal cavity and septum were examined for mucosal changes, obstruction, or discharge. The oral cavity and oropharynx were inspected for mucosal lesions, infection, or tonsillar hypertrophy. The neck was palpated for lymphadenopathy, thyroid  abnormalities, or other masses. Cranial nerve function was grossly intact.  Pertinent Findings: Physical Exam HEENT: Atraumatic, normocephalic. External ears with cerumen  present, removed. Hearing aid not the right size. Tympanic membrane with normal landmarks.impression along posterior right cartilaginous canal. No mucosal changes.   Seprately Identifiable Procedures:  I personally ordered, reviewed and interpreted the following with the patient today  Given the patient's symptoms and incomplete visualization of critical sinonasal areas with anterior rhinoscopy, a separately performed diagnostic nasal endoscopy procedure is indicated for a complete rhinologic evaluation per American Rhinologic Society recommendations (https://www.american-rhinologic.org/position-statements)  I personally ordered, reviewed and interpreted the following with the patient today  Procedure Note Diagnostic Nasal Endoscopy CPT CODE -- 68768 - Mod 25  Prior to initiating any procedures, risks/benefits/alternatives were explained to the patient and verbal consent obtained.  Pre-procedure diagnosis: Concern for sinusitis Post-procedure diagnosis: same Indication: See pre-procedure diagnosis and physical exam above Complications: None apparent EBL: 0 mL Anesthesia: Lidocaine  4% and topical decongestant was topically sprayed in each nasal cavity  Description of Procedure:  Patient was identified. A flexible fiberoptic endoscope was utilized to evaluate the sinonasal cavities, mucosa, sinus ostia and turbinates and septum.  Overall, signs of mucosal inflammation are noted.  Also noted are no visible polyps. Positive for mild edema.  No mucopurulence, polyps, or masses noted.   Right Middle meatus: edema Right SE Recess: clear Left MM: edema Left SE Recess: clear  Impression & Plans:  [name] is a 83 y.o. female  1. Chronic frontal sinusitis   2. Chronic maxillary sinusitis   3. Chronic ethmoidal sinusitis    - Findings and diagnoses discussed in detail with the patient. - Risks, benefits, and alternatives were reviewed. Through shared decision making, the patient elects to  proceed with below.  Assessment & Plan Chronic sinusitis with nasal polyps Chronic sinusitis with nasal polyps likely causing nasal obstruction and discharge. She declined surgery due to age and preference. - Continue current nasal spray regimen. - Given age and medical history will plan to treat medically. Patient okay to call in for antibiotic or call in for visit. - Consider surgical intervention if symptoms worsen.  Chronic rhinorrhea Persistent nasal discharge likely related to chronic sinusitis and nasal polyps. Improvement noted but frequent nose blowing persists. - Continue current nasal spray regimen. - Consider additional nasal spray if symptoms persist. - She is to restart nasal sinus rinses.   Right ear otalgia. - poorly fitting  right ear bud with hearing aids, can see impression along posterior right cartilaginous canal. No mucosal changes - follow up with audiiologist for different fit or custom ear bud. - no infection or drainage  - Orders placed:  Orders Placed This Encounter  Procedures   Ambulatory Referral For Surgery Scheduling  She will call to schedule. Will need medical clearance and off blood thinners.   - Medications prescribed/continued/adjusted: Start sinus rinses  - Education materials provided to the patient. - Follow up: 3-4 months. Patient instructed to return sooner or go to the ED if new/worsening symptoms develop.   Thank you for allowing me the opportunity to care for your patient. Please do not hesitate to contact me should you have any other questions.  Sincerely, Penne Croak, DO Otolaryngologist (ENT) [facility] Phone: [phone] Fax: [phone]  10/08/2024, 10:17 AM

## 2024-10-09 NOTE — Telephone Encounter (Signed)
 Patient called. Returning call from office.

## 2024-10-09 NOTE — Telephone Encounter (Signed)
 Message left for patient to return call to clinic.

## 2024-10-14 ENCOUNTER — Other Ambulatory Visit: Payer: Self-pay | Admitting: Internal Medicine

## 2024-10-22 NOTE — Progress Notes (Deleted)
  Cardiology Office Note   Date:  10/22/2024  ID:  [name], [name] Mar 04, 1941, MRN [id number] PCP: Angelica Glade JINNY, MD  [facility] Providers Cardiologist:  Angelica Fell, MD { Click to update primary MD,subspecialty MD or APP then REFRESH:1}    History of Present Illness [name] is a 83 y.o. female with history of CAD s/p CABG x5 in 2007, aortic stenosis s/p TAVR August 2024, HTN, HLD chronic diastolic HF, and breast cancer s/p mastectomy.     She underwent CABG in 2007 (SVG to LAD and diag, LIMA to OM, and PDA and posterolateral arteries fill from the sequential vein graft). Cardiac catheterization in 2024 demonstrated patency in all bypass grafts. She began to have increasing progression of aortic stenosis and underwent TAVR 06/21/2023. She obtained an occluded common femoral artery and was emergently taken to OR for successful right femoral thrombectomy and endarterectomy with bovine pericardial patch angioplasty.  No other post operative events noted. She was discharged to [facility] for PT and left after 4 hours of being there. She did have some issues with swelling and mobility after TAVR. This did improve some with cardiac rehab. She was hospitalized 09/11/24 for chest pain and was found to be in a-fib RVR. Converted with Cardizem. She was discharged on apixaban  2.5 mg BID and aspirin  81 mg d/c. Echo 09/12/24 LVEF 60-65%, no RWMA, and some pulmonic regurgitation. She was seen 09/19/24 by PCP and noted one episode of a-fib since hospital discharge.   [name] presents today for a hospital follow up after a-fib RVR.    ROS: All systems are negative unless otherwise indicated in HPI.   Studies Reviewed      *** Risk Assessment/Calculations {Does this patient have ATRIAL FIBRILLATION?:[phone]} No BP recorded.  {Refresh Note OR Click here to enter BP  :1}***       Physical Exam VS:  There were no vitals taken for this visit.       Wt Readings from Last 3  Encounters:  10/05/24 115 lb (52.2 kg)  10/04/24 114 lb (51.7 kg)  09/19/24 114 lb (51.7 kg)    GEN: Well nourished, well developed in no acute distress NECK: No JVD; No carotid bruits CARDIAC: ***RRR, no murmurs, rubs, gallops RESPIRATORY:  Clear to auscultation without rales, wheezing or rhonchi  ABDOMEN: Soft, non-tender, non-distended EXTREMITIES:  No edema; No deformity   ASSESSMENT AND PLAN ***    {Are you ordering a CV Procedure (e.g. stress test, cath, DCCV, TEE, etc)?   Press F2        :[id number]}  Dispo: ***  Signed, Angelica KATHEE Pizza, FNP

## 2024-10-23 ENCOUNTER — Encounter: Payer: Self-pay | Admitting: Cardiovascular Disease

## 2024-10-23 ENCOUNTER — Ambulatory Visit

## 2024-10-23 ENCOUNTER — Ambulatory Visit: Attending: Cardiovascular Disease | Admitting: Cardiovascular Disease

## 2024-10-23 ENCOUNTER — Ambulatory Visit: Admitting: Physician Assistant

## 2024-10-23 VITALS — BP 120/68 | HR 55 | Ht 62.0 in | Wt 114.0 lb

## 2024-10-23 DIAGNOSIS — I480 Paroxysmal atrial fibrillation: Secondary | ICD-10-CM | POA: Diagnosis not present

## 2024-10-23 DIAGNOSIS — Z952 Presence of prosthetic heart valve: Secondary | ICD-10-CM

## 2024-10-23 DIAGNOSIS — I4891 Unspecified atrial fibrillation: Secondary | ICD-10-CM

## 2024-10-23 DIAGNOSIS — I25119 Atherosclerotic heart disease of native coronary artery with unspecified angina pectoris: Secondary | ICD-10-CM

## 2024-10-23 DIAGNOSIS — I5032 Chronic diastolic (congestive) heart failure: Secondary | ICD-10-CM | POA: Diagnosis not present

## 2024-10-23 MED ORDER — APIXABAN 2.5 MG PO TABS: 2.5000 mg | ORAL_TABLET | Freq: Two times a day (BID) | ORAL | 2 refills | Status: AC

## 2024-10-23 NOTE — Progress Notes (Unsigned)
 Cardiology Office Note:    Date:  10/23/2024   ID:  [name], DOB [birth year], MRN [id number]  PCP:  Geofm Glade JINNY, MD   [facility] Providers Cardiologist:  Ozell Fell, MD     Referring MD: Geofm Glade JINNY, MD   No chief complaint on file.   History of Present Illness:    [name] is a 83 y.o. female with a hx of coronary artery disease dating back to her initial presentation in 2007 when she required multivessel CABG. She developed aortic stenosis and underwent TAVR in August 2024, treated with a 20 mm SAPIEN 3 valve complicated by an ischemic right leg with need for surgical repair of an occluded right femoral artery. Her postoperative valve function has been normal.    Current Medications: Current Meds  Medication Sig   alendronate  (FOSAMAX ) 70 MG tablet Take 1 tablet (70 mg total) by mouth every 7 (seven) days. Take with a full glass of water  on an empty stomach.   apixaban  (ELIQUIS ) 2.5 MG TABS tablet Take 1 tablet (2.5 mg total) by mouth 2 (two) times daily.   atorvastatin  (LIPITOR) 20 MG tablet TAKE 1 TABLET BY MOUTH EVERY DAY   Calcium  Carbonate (CALTRATE 600 PO) Take 1 tablet by mouth 2 (two) times daily.     chlorpheniramine (CHLOR-TRIMETON) 4 MG tablet Take 2 mg by mouth in the morning.   citalopram  (CELEXA ) 20 MG tablet TAKE 1 TABLET BY MOUTH EVERY DAY   co-enzyme Q-10 30 MG capsule Take 30 mg by mouth daily.   famotidine  (PEPCID ) 40 MG tablet TAKE 1 TABLET BY MOUTH EVERY DAY   fluticasone  (FLONASE ) 50 MCG/ACT nasal spray Place 1 spray into both nostrils daily as needed for allergies or rhinitis.   ipratropium (ATROVENT ) 0.03 % nasal spray Place 2 sprays into both nostrils 3 (three) times daily with meals as needed for rhinitis.   levothyroxine  (SYNTHROID ) 75 MCG tablet TAKE 1 TABLET BY MOUTH EVERY MORNING BEFORE BREAKFAST   losartan  (COZAAR ) 25 MG tablet Take 0.5 tablets (12.5 mg total) by mouth daily.   metoprolol  tartrate (LOPRESSOR ) 25 MG  tablet TAKE 1 TABLET BY MOUTH TWICE A DAY   Multiple Vitamins-Minerals (MULTIVITAMIN) LIQD Take 1 tablet by mouth daily.   Multiple Vitamins-Minerals (PRESERVISION AREDS 2) CAPS Take 1 capsule by mouth 2 (two) times daily.   senna-docusate (SENOKOT-S) 8.6-50 MG per tablet Take 1 tablet by mouth daily.     VITAMIN D , CHOLECALCIFEROL , PO Take 1 tablet by mouth daily.   zolpidem  (AMBIEN ) 5 MG tablet TAKE 1 TABLET (5 MG TOTAL) BY MOUTH EVERY DAY AT BEDTIME AS NEEDED FOR SLEEP     Allergies:   Clarithromycin, Clindamycin, Codeine, Levofloxacin, Morphine, Naproxen, Omeprazole , Penicillins, and Pneumococcal vaccines   ROS:   Please see the history of present illness.    *** All other systems reviewed and are negative.  EKGs/Labs/Other Studies Reviewed:    The following studies were reviewed today: Cardiac Studies & Procedures   ______________________________________________________________________________________________ CARDIAC CATHETERIZATION  CARDIAC CATHETERIZATION 06/09/2023  Conclusion   Mid RCA lesion is 100% stenosed.   Prox Cx lesion is 99% stenosed.   Ost LAD to Prox LAD lesion is 90% stenosed.   SVG graft was visualized by angiography and is normal in caliber.   SVG graft was visualized by angiography and is normal in caliber.   LIMA graft was visualized by angiography and is normal in caliber.  Severe triple vessel CAD s/p 5V CABG with  5/5 patent bypass grafts Severe stenosis proximal LAD. Patent sequential vein graft to the LAD and Diagonal Severe proximal Circumflex stenosis. Patent LIMA graft to the obtuse marginal branch Chronic total occlusion of the mid RCA. The PDA and posterolateral arteries fill from the sequential vein graft. Elevated right heart pressures RA 7 RV 47/7/9 PA 48/19 mean 35 PCWP 26   Recommendations: Continue workup for TAVR. Medical management of CAD  Findings Coronary Findings Diagnostic  Dominance: Right  Left Anterior Descending Ost LAD  to Prox LAD lesion is 90% stenosed.  Left Circumflex Vessel is large. Prox Cx lesion is 99% stenosed.  Right Coronary Artery Vessel is large. Mid RCA lesion is 100% stenosed. The lesion is chronically occluded.  Sequential Saphenous Graft To 2nd RPL, RPDA SVG graft was visualized by angiography and is normal in caliber.  Sequential Saphenous Graft To 1st Diag, Mid LAD SVG graft was visualized by angiography and is normal in caliber.  LIMA LIMA Graft To 2nd Mrg LIMA graft was visualized by angiography and is normal in caliber.  Intervention  No interventions have been documented.   STRESS TESTS  MYOCARDIAL PERFUSION IMAGING 12/17/2021  Interpretation Summary   The study is normal. Findings are consistent with no prior ischemia and no prior myocardial infarction. The study is low risk.   No ST deviation was noted.   LV perfusion is normal. There is no evidence of ischemia. There is no evidence of infarction.   Left ventricular function is normal. Nuclear stress EF: 71 %. The left ventricular ejection fraction is hyperdynamic (>65%). End diastolic cavity size is normal. End systolic cavity size is normal.   Prior study available for comparison from 09/02/2017. No changes compared to prior study.   ECHOCARDIOGRAM  ECHOCARDIOGRAM LIMITED 09/12/2024  Narrative ECHOCARDIOGRAM LIMITED REPORT    Patient Name:   [name] Date of Exam: 09/12/2024 Medical Rec #:  [id number]     Height:       64.0 in Accession #:    [id number]    Weight:       115.0 lb Date of Birth:  [birth year]      BSA:          1.546 m Patient Age:    83 years      BP:           154/56 mmHg Patient Gender: F             HR:           38 bpm. Exam Location:  Inpatient  Procedure: Limited Echo, Cardiac Doppler and Color Doppler (Both Spectral and Color Flow Doppler were utilized during procedure).  Indications:    NSTEMI  History:        Patient has prior history of Echocardiogram examinations,  most recent 07/05/2024. CHF, CAD, Prior CABG; Risk Factors:Hypertension and Dyslipidemia. Aortic Valve: 23 mm Sapien prosthetic, stented (TAVR) valve is present in the aortic position. Procedure Date: 06/21/23.  Sonographer:    Philomena Daring Referring Phys: [id number] [facility]   Sonographer Comments: Suboptimal apical window and Technically challenging study due to limited acoustic windows. Image acquisition challenging due to patient body habitus and Image acquisition challenging due to breast implants. IMPRESSIONS   1. Limited echo 2. Left ventricular ejection fraction, by estimation, is 60 to 65%. Left ventricular ejection fraction by PLAX is 60 %. The left ventricle has normal function. The left ventricle has no regional wall motion abnormalities. There is mild left ventricular hypertrophy.  Left ventricular diastolic parameters are indeterminate. 3. Right ventricular systolic function is normal. The right ventricular size is normal. 4. Aortic valve gradients were not obtained. The aortic valve has been repaired/replaced. Aortic valve regurgitation is not visualized. There is a 23 mm Sapien prosthetic (TAVR) valve present in the aortic position. Procedure Date: 06/21/23. 5. The pulmonic valve was abnormal. 6. The inferior vena cava is normal in size with greater than 50% respiratory variability, suggesting right atrial pressure of 3 mmHg.  Comparison(s): Changes from prior study are noted. 07/05/2024: LVEF 55-60%.  FINDINGS Left Ventricle: Left ventricular ejection fraction, by estimation, is 60 to 65%. Left ventricular ejection fraction by PLAX is 60 %. The left ventricle has normal function. The left ventricle has no regional wall motion abnormalities. The left ventricular internal cavity size was normal in size. There is mild left ventricular hypertrophy. Left ventricular diastolic parameters are indeterminate.  Right Ventricle: The right ventricular size is normal. No increase in  right ventricular wall thickness. Right ventricular systolic function is normal.  Aortic Valve: Aortic valve gradients were not obtained. The aortic valve has been repaired/replaced. Aortic valve regurgitation is not visualized. There is a 23 mm Sapien prosthetic, stented (TAVR) valve present in the aortic position. Procedure Date: 06/21/23.  Pulmonic Valve: The pulmonic valve was abnormal. Pulmonic valve regurgitation is mild.  Venous: The inferior vena cava is normal in size with greater than 50% respiratory variability, suggesting right atrial pressure of 3 mmHg.  Additional Comments: Spectral Doppler performed. Color Doppler performed.  LEFT VENTRICLE PLAX 2D LV EF:         Left ventricular ejection fraction by PLAX is 60 %. LVIDd:         2.90 cm LVIDs:         2.00 cm LV PW:         1.00 cm LV IVS:        1.20 cm LVOT diam:     2.00 cm LVOT Area:     3.14 cm   IVC IVC diam: 1.60 cm  LEFT ATRIUM         Index LA diam:    2.90 cm 1.88 cm/m  AORTA Ao Root diam: 2.60 cm   SHUNTS Systemic Diam: 2.00 cm  Vinie Maxcy MD Electronically signed by Vinie Maxcy MD Signature Date/Time: 09/12/2024/3:34:43 PM    Final      CT SCANS  CT CORONARY MORPH W/CTA COR W/SCORE 03/08/2023  Addendum 03/08/2023  1:17 PM ADDENDUM REPORT: 03/08/2023 13:14  CLINICAL DATA:  Aortic stenosis  EXAM: Cardiac TAVR CT  TECHNIQUE: The patient was scanned on a Siemens Force 192 slice scanner. A 120 kV retrospective scan was triggered in the descending thoracic aorta at 111 HU's. Gantry rotation speed was 270 msecs and collimation was .9 mm. No beta blockade or nitro were given. The 3D data set was reconstructed in 5% intervals of the R-R cycle. Systolic and diastolic phases were analyzed on a dedicated work station using MPR, MIP and VRT modes. The patient received 80 cc of contrast.  FINDINGS: Aortic Valve: Functionally bicuspid with fused right and left cusp AV with calcium   score 874  Aorta: No aneurysm normal arch vessels moderate calcific atherosclerosis  Sinotubular Junction: 26.4 mm  Ascending Thoracic Aorta: 34 mm  Aortic Arch: 25 mm  Descending Thoracic Aorta: 24 mm  Sinus of Valsalva Measurements:  Non-coronary: 31.4 mm  Height 23.1 mm  Right - coronary: 28.6 mm  Height 16.2 mm  Left -  coronary: 30.8 mm  Height 18.4 mm  Coronary Artery Height above Annulus:  Left Main: 13.9 mm above annulus  Right Coronary: 9.3 mm above annulus  Virtual Basal Annulus Measurements:  Maximum/Minimum Diameter: 23.3 mm x 18.6 mm Average diameter 21.2 mm  Perimeter: 67.7 mm  Area: 353 mm2  Coronary Arteries: Sufficient height above annulus for deployment and patient has by pass grafts  Optimum Fluoroscopic Angle for Delivery: LAO 7 Caudal 6 degrees  IMPRESSION: 1. Functionally bicuspid AV with fused right and left cusp score 874  2. Annular area of 353 mm2 suitable for a 23 mm Sapien 3 valve Alternatively a 26 Medtronic Evolut valve would fit  3.  Optimum angiographic angle for deployment LAO 7 Caudal 6 degrees  4.  Coronary arteries sufficient height above annulus for deployment  5.  There are 2 patent SVGls and a patent LIMA  6.  Membranous septal length 9.5 mm  Maude Emmer   Electronically Signed By: Maude Emmer M.D. On: 03/08/2023 13:14  Narrative EXAM: OVER-READ INTERPRETATION  CT CHEST  The following report is a limited chest CT over-read performed by radiologist Dr. Rea Marc of [facility] on 03/08/2023. This over-read does not include interpretation of cardiac or coronary anatomy or pathology. The cardiac TAVR interpretation by the cardiologist is attached.  COMPARISON:  None Available.  FINDINGS: Extracardiac findings will be described separately under dictation for contemporaneously obtained CTA chest, abdomen and pelvis.  IMPRESSION: Please see separate dictation for contemporaneously  obtained CTA chest, abdomen and pelvis dated 03/08/2023 for full description of relevant extracardiac findings.  Electronically Signed: By: Rea Marc M.D. On: 03/08/2023 11:53     ______________________________________________________________________________________________      EKG:        Recent Labs: 09/11/2024: ALT 31; B Natriuretic Peptide 424.3 09/12/2024: BUN 11; Creatinine, Ser 0.60; Hemoglobin 11.2; Magnesium  2.0; Platelets 222; Potassium 3.7; Sodium 139; TSH 2.435  Recent Lipid Panel    Component Value Date/Time   CHOL 125 06/20/2024 1609   CHOL 134 07/28/2018 1153   TRIG 58.0 06/20/2024 1609   HDL 52.10 06/20/2024 1609   HDL 52 07/28/2018 1153   CHOLHDL 2 06/20/2024 1609   VLDL 11.6 06/20/2024 1609   LDLCALC 61 06/20/2024 1609   LDLCALC 71 07/30/2020 1631     Risk Assessment/Calculations:   {Does this patient have ATRIAL FIBRILLATION?[phone]}            Physical Exam:    VS:  BP 120/68 (BP Location: Right Arm, Patient Position: Sitting, Cuff Size: Normal)   Pulse (!) 55   Ht 5' 2 (1.575 m)   Wt 114 lb (51.7 kg)   SpO2 97%   BMI 20.85 kg/m     Wt Readings from Last 3 Encounters:  10/23/24 114 lb (51.7 kg)  10/05/24 115 lb (52.2 kg)  10/04/24 114 lb (51.7 kg)     GEN: *** Well nourished, well developed in no acute distress HEENT: Normal NECK: No JVD; No carotid bruits LYMPHATICS: No lymphadenopathy CARDIAC: ***RRR, no murmurs, rubs, gallops RESPIRATORY:  Clear to auscultation without rales, wheezing or rhonchi  ABDOMEN: Soft, non-tender, non-distended MUSCULOSKELETAL:  No edema; No deformity  SKIN: Warm and dry NEUROLOGIC:  Alert and oriented x 3 PSYCHIATRIC:  Normal affect   Assessment & Plan        {Are you ordering a CV Procedure (e.g. stress test, cath, DCCV, TEE, etc)?   Press F2        :[id number]}  Medication Adjustments/Labs and Tests Ordered: Current medicines are reviewed at length with the patient today.   Concerns regarding medicines are outlined above.  No orders of the defined types were placed in this encounter.  No orders of the defined types were placed in this encounter.   There are no Patient Instructions on file for this visit.   Signed, Ozell Fell, MD  10/23/2024 3:31 PM    [facility]

## 2024-10-23 NOTE — Patient Instructions (Signed)
 Medication Instructions:  The current medical regimen is effective;  continue present plan and medications.  *If you need a refill on your cardiac medications before your next appointment, please call your pharmacy*  Testing/Procedures: ZIO XT- Long Term Monitor Instructions  Your physician has requested you wear a ZIO patch monitor for 14 days.  This is a single patch monitor. [employer] supplies one patch monitor per enrollment. Additional stickers are not available. Please do not apply patch if you will be having a Nuclear Stress Test,  Echocardiogram, Cardiac CT, MRI, or Chest Xray during the period you would be wearing the  monitor. The patch cannot be worn during these tests. You cannot remove and re-apply the  ZIO XT patch monitor.  Your ZIO patch monitor will be mailed 3 day USPS to your address on file. It may take 3-5 days  to receive your monitor after you have been enrolled.  Once you have received your monitor, please review the enclosed instructions. Your monitor  has already been registered assigning a specific monitor serial # to you.  Billing and Patient Assistance Program Information  We have supplied [employer] with any of your insurance information on file for billing purposes. [employer] offers a sliding scale Patient Assistance Program for patients that do not have  insurance, or whose insurance does not completely cover the cost of the ZIO monitor.  You must apply for the Patient Assistance Program to qualify for this discounted rate.  To apply, please call [employer] at [phone], select option 4, select option 2, ask to apply for  Patient Assistance Program. Meredeth will ask your household income, and how many people  are in your household. They will quote your out-of-pocket cost based on that information.  [employer] will also be able to set up a 23-month, interest-free payment plan if needed.  Applying the monitor   Shave hair from upper left chest.  Hold abrader disc  by orange tab. Rub abrader in 40 strokes over the upper left chest as  indicated in your monitor instructions.  Clean area with 4 enclosed alcohol pads. Let dry.  Apply patch as indicated in monitor instructions. Patch will be placed under collarbone on left  side of chest with arrow pointing upward.  Rub patch adhesive wings for 2 minutes. Remove white label marked 1. Remove the white  label marked 2. Rub patch adhesive wings for 2 additional minutes.  While looking in a mirror, press and release button in center of patch. A small green light will  flash 3-4 times. This will be your only indicator that the monitor has been turned on.  Do not shower for the first 24 hours. You may shower after the first 24 hours.  Press the button if you feel a symptom. You will hear a small click. Record Date, Time and  Symptom in the Patient Logbook.  When you are ready to remove the patch, follow instructions on the last 2 pages of Patient  Logbook. Stick patch monitor onto the last page of Patient Logbook.  Place Patient Logbook in the blue and white box. Use locking tab on box and tape box closed  securely. The blue and white box has prepaid postage on it. Please place it in the mailbox as  soon as possible. Your physician should have your test results approximately 7 days after the  monitor has been mailed back to [facility].  Call [facility] Customer Care at [phone] if you have questions regarding  your ZIO XT patch  monitor. Call them immediately if you see an orange light blinking on your  monitor.  If your monitor falls off in less than 4 days, contact our Monitor department at [phone].  If your monitor becomes loose or falls off after 4 days call [employer] at [phone] for  suggestions on securing your monitor   Follow-Up: At [facility], you and your health needs are our priority.  As part of our continuing mission to provide you with exceptional heart care,  our providers are all part of one team.  This team includes your primary Cardiologist (physician) and Advanced Practice Providers or APPs (Physician Assistants and Nurse Practitioners) who all work together to provide you with the care you need, when you need it.  Your next appointment:   3 month(s)  Provider:   Glendia Ferrier, PA-C          We recommend signing up for the patient portal called MyChart.  Sign up information is provided on this After Visit Summary.  MyChart is used to connect with patients for Virtual Visits (Telemedicine).  Patients are able to view lab/test results, encounter notes, upcoming appointments, etc.  Non-urgent messages can be sent to your provider as well.   To learn more about what you can do with MyChart, go to [email or web address].

## 2024-10-23 NOTE — Progress Notes (Unsigned)
 Enrolled for [employer] to mail a ZIO XT long term holter monitor to the patients address on file.  [id number] mailed to patient and applied in office 11/12/24.

## 2024-10-25 ENCOUNTER — Encounter: Payer: Self-pay | Admitting: Cardiovascular Disease

## 2024-10-25 NOTE — Assessment & Plan Note (Signed)
 All bypass grafts on pre-TAVR cath. No angina at present. Advised her to avoid ASA now that she is anticoagulated with apixaban .

## 2024-10-25 NOTE — Assessment & Plan Note (Signed)
 Normal function of TAVR prosthesis on serial echo studies. Continue SBE prophylaxis when indicated.

## 2024-11-02 ENCOUNTER — Telehealth: Payer: Self-pay | Admitting: Cardiovascular Disease

## 2024-11-02 NOTE — Telephone Encounter (Signed)
 Patient calling in with question concerning the monitor and how to apply it on. She states that she would like to come in to apply it. Please advise

## 2024-11-05 NOTE — Telephone Encounter (Signed)
 LMVM Please call [name] or [name] at [phone], in the monitor department to schedule an appointment to have your ZIO patch monitor applied.

## 2024-11-05 NOTE — Telephone Encounter (Signed)
 Patient scheduled to bring her ZIO XT monitor in on 11/12/24 at 3:00 PM to have it applied.

## 2024-11-05 NOTE — Telephone Encounter (Signed)
 Patient states she is having difficulty applying heart monitor. She states the instructions are too confusing and when she calls the monitor company she cannot understand them.  Attempted to walk patient through applying monitor verbally over the phone but she states she would prefer to come in and have monitor applied in office.  Informed patient I will forward this message to our monitor techs to follow-up with her about coming in to have monitor applied.

## 2024-11-05 NOTE — Telephone Encounter (Signed)
 Patient calling again to f/u on this question. Please advise.

## 2024-11-07 ENCOUNTER — Other Ambulatory Visit: Payer: Self-pay | Admitting: Internal Medicine

## 2024-11-12 ENCOUNTER — Ambulatory Visit: Attending: Internal Medicine

## 2024-12-05 ENCOUNTER — Telehealth: Payer: Self-pay | Admitting: Internal Medicine

## 2024-12-05 NOTE — Telephone Encounter (Signed)
Form recieved

## 2024-12-05 NOTE — Telephone Encounter (Signed)
 Form to be filled out placed in Dr. Geofm box up front.

## 2024-12-07 NOTE — Telephone Encounter (Signed)
 Form completed and awaiting Dr. Geofm signature so I can fax it back.

## 2024-12-09 ENCOUNTER — Ambulatory Visit: Payer: Self-pay | Admitting: Cardiovascular Disease

## 2024-12-09 DIAGNOSIS — I4891 Unspecified atrial fibrillation: Secondary | ICD-10-CM

## 2024-12-11 NOTE — Telephone Encounter (Signed)
Form faxed back today and conformation received.

## 2024-12-25 ENCOUNTER — Ambulatory Visit: Admitting: Internal Medicine

## 2025-01-14 ENCOUNTER — Ambulatory Visit: Admitting: Podiatry

## 2025-01-21 ENCOUNTER — Ambulatory Visit: Admitting: Physician Assistant

## 2025-01-23 ENCOUNTER — Ambulatory Visit

## 2025-03-04 ENCOUNTER — Ambulatory Visit: Admitting: Podiatry

## 2025-04-05 ENCOUNTER — Ambulatory Visit (INDEPENDENT_AMBULATORY_CARE_PROVIDER_SITE_OTHER)
